# Patient Record
Sex: Male | Born: 1940
Health system: Southern US, Community
[De-identification: ages and names within clinical notes are randomized; demographics above are authoritative.]

## PROBLEM LIST (undated history)

## (undated) DIAGNOSIS — I428 Other cardiomyopathies: Secondary | ICD-10-CM

## (undated) DIAGNOSIS — N189 Chronic kidney disease, unspecified: Secondary | ICD-10-CM

## (undated) DIAGNOSIS — M009 Pyogenic arthritis, unspecified: Secondary | ICD-10-CM

## (undated) DIAGNOSIS — E785 Hyperlipidemia, unspecified: Secondary | ICD-10-CM

## (undated) DIAGNOSIS — I10 Essential (primary) hypertension: Secondary | ICD-10-CM

## (undated) DIAGNOSIS — I5020 Unspecified systolic (congestive) heart failure: Secondary | ICD-10-CM

## (undated) DIAGNOSIS — I2510 Atherosclerotic heart disease of native coronary artery without angina pectoris: Secondary | ICD-10-CM

## (undated) DIAGNOSIS — M25462 Effusion, left knee: Secondary | ICD-10-CM

## (undated) DIAGNOSIS — I2089 Other forms of angina pectoris: Secondary | ICD-10-CM

## (undated) DIAGNOSIS — I509 Heart failure, unspecified: Secondary | ICD-10-CM

## (undated) DIAGNOSIS — I208 Other forms of angina pectoris: Secondary | ICD-10-CM

## (undated) DIAGNOSIS — M130 Polyarthritis, unspecified: Secondary | ICD-10-CM

## (undated) DIAGNOSIS — M170 Bilateral primary osteoarthritis of knee: Secondary | ICD-10-CM

## (undated) DIAGNOSIS — D638 Anemia in other chronic diseases classified elsewhere: Secondary | ICD-10-CM

## (undated) DIAGNOSIS — M6282 Rhabdomyolysis: Secondary | ICD-10-CM

## (undated) DIAGNOSIS — E782 Mixed hyperlipidemia: Secondary | ICD-10-CM

## (undated) HISTORY — DX: Essential (primary) hypertension: I10

## (undated) HISTORY — DX: Atherosclerotic heart disease of native coronary artery without angina pectoris: I25.10

## (undated) HISTORY — DX: Hyperlipidemia, unspecified: E78.5

## (undated) HISTORY — DX: Other forms of angina pectoris: I20.8

## (undated) HISTORY — DX: Heart failure, unspecified: I50.9

## (undated) HISTORY — DX: Other forms of angina pectoris: I20.89

## (undated) HISTORY — DX: Pyogenic arthritis, unspecified: M00.9

## (undated) HISTORY — DX: Chronic kidney disease, unspecified: N18.9

---
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
## 1997-11-24 ENCOUNTER — Emergency Department (HOSPITAL_COMMUNITY): Admission: EM | Admit: 1997-11-24 | Discharge: 1997-11-24 | Payer: Self-pay | Admitting: Emergency Medicine

## 1997-12-03 ENCOUNTER — Encounter: Admission: RE | Admit: 1997-12-03 | Discharge: 1998-03-03 | Payer: Self-pay | Admitting: *Deleted

## 1998-03-11 ENCOUNTER — Encounter: Admission: RE | Admit: 1998-03-11 | Discharge: 1998-06-09 | Payer: Self-pay

## 1999-11-14 ENCOUNTER — Emergency Department (HOSPITAL_COMMUNITY): Admission: EM | Admit: 1999-11-14 | Discharge: 1999-11-14 | Payer: Self-pay | Admitting: Emergency Medicine

## 1999-11-14 ENCOUNTER — Encounter: Payer: Self-pay | Admitting: Emergency Medicine

## 1999-12-02 ENCOUNTER — Encounter: Admission: RE | Admit: 1999-12-02 | Discharge: 2000-01-27 | Payer: Self-pay | Admitting: Occupational Medicine

## 1999-12-30 ENCOUNTER — Encounter: Payer: Self-pay | Admitting: Occupational Medicine

## 1999-12-30 ENCOUNTER — Encounter: Admission: RE | Admit: 1999-12-30 | Discharge: 1999-12-30 | Payer: Self-pay | Admitting: Occupational Medicine

## 2000-03-08 ENCOUNTER — Encounter
Admission: RE | Admit: 2000-03-08 | Discharge: 2000-06-06 | Payer: Self-pay | Admitting: Physical Medicine & Rehabilitation

## 2000-06-22 ENCOUNTER — Encounter
Admission: RE | Admit: 2000-06-22 | Discharge: 2000-06-22 | Payer: Self-pay | Admitting: Physical Medicine & Rehabilitation

## 2000-08-11 ENCOUNTER — Ambulatory Visit (HOSPITAL_COMMUNITY)
Admission: RE | Admit: 2000-08-11 | Discharge: 2000-08-11 | Payer: Self-pay | Admitting: Physical Medicine & Rehabilitation

## 2000-08-11 ENCOUNTER — Encounter: Payer: Self-pay | Admitting: Physical Medicine & Rehabilitation

## 2000-09-15 ENCOUNTER — Encounter
Admission: RE | Admit: 2000-09-15 | Discharge: 2000-11-12 | Payer: Self-pay | Admitting: Physical Medicine & Rehabilitation

## 2007-10-24 ENCOUNTER — Emergency Department (HOSPITAL_COMMUNITY): Admission: EM | Admit: 2007-10-24 | Discharge: 2007-10-24 | Payer: Self-pay | Admitting: Emergency Medicine

## 2008-10-19 ENCOUNTER — Inpatient Hospital Stay (HOSPITAL_COMMUNITY): Admission: EM | Admit: 2008-10-19 | Discharge: 2008-10-22 | Payer: Self-pay | Admitting: Emergency Medicine

## 2008-10-19 ENCOUNTER — Ambulatory Visit: Payer: Self-pay | Admitting: Cardiology

## 2008-10-19 ENCOUNTER — Encounter (INDEPENDENT_AMBULATORY_CARE_PROVIDER_SITE_OTHER): Payer: Self-pay | Admitting: Internal Medicine

## 2008-10-30 DIAGNOSIS — E782 Mixed hyperlipidemia: Secondary | ICD-10-CM

## 2008-10-30 HISTORY — DX: Mixed hyperlipidemia: E78.2

## 2008-10-31 ENCOUNTER — Ambulatory Visit: Payer: Self-pay | Admitting: Cardiology

## 2008-10-31 ENCOUNTER — Emergency Department (HOSPITAL_COMMUNITY): Admission: EM | Admit: 2008-10-31 | Discharge: 2008-10-31 | Payer: Self-pay | Admitting: Emergency Medicine

## 2008-10-31 DIAGNOSIS — I5020 Unspecified systolic (congestive) heart failure: Secondary | ICD-10-CM | POA: Insufficient documentation

## 2008-10-31 DIAGNOSIS — I10 Essential (primary) hypertension: Secondary | ICD-10-CM

## 2008-10-31 HISTORY — DX: Essential (primary) hypertension: I10

## 2008-11-01 ENCOUNTER — Telehealth: Payer: Self-pay | Admitting: Cardiology

## 2008-11-02 ENCOUNTER — Ambulatory Visit: Payer: Self-pay | Admitting: Cardiology

## 2008-11-02 ENCOUNTER — Ambulatory Visit (HOSPITAL_COMMUNITY): Admission: RE | Admit: 2008-11-02 | Discharge: 2008-11-02 | Payer: Self-pay | Admitting: Cardiology

## 2008-11-05 ENCOUNTER — Telehealth: Payer: Self-pay | Admitting: Cardiology

## 2008-11-09 ENCOUNTER — Encounter: Payer: Self-pay | Admitting: Cardiology

## 2008-11-27 ENCOUNTER — Ambulatory Visit: Payer: Self-pay | Admitting: Cardiology

## 2008-11-27 DIAGNOSIS — I428 Other cardiomyopathies: Secondary | ICD-10-CM

## 2008-11-27 DIAGNOSIS — I2510 Atherosclerotic heart disease of native coronary artery without angina pectoris: Secondary | ICD-10-CM

## 2008-11-27 HISTORY — DX: Other cardiomyopathies: I42.8

## 2009-01-07 ENCOUNTER — Ambulatory Visit: Payer: Self-pay | Admitting: Cardiology

## 2009-01-07 DIAGNOSIS — T887XXA Unspecified adverse effect of drug or medicament, initial encounter: Secondary | ICD-10-CM | POA: Insufficient documentation

## 2009-02-20 ENCOUNTER — Ambulatory Visit: Payer: Self-pay | Admitting: Cardiology

## 2009-03-18 ENCOUNTER — Encounter: Payer: Self-pay | Admitting: Cardiology

## 2009-05-06 ENCOUNTER — Ambulatory Visit: Payer: Self-pay | Admitting: Cardiology

## 2009-05-07 ENCOUNTER — Telehealth: Payer: Self-pay | Admitting: Cardiology

## 2009-05-20 ENCOUNTER — Ambulatory Visit: Payer: Self-pay | Admitting: Cardiology

## 2009-05-20 DIAGNOSIS — I5022 Chronic systolic (congestive) heart failure: Secondary | ICD-10-CM

## 2009-05-22 LAB — CONVERTED CEMR LAB
Chloride: 109 meq/L (ref 96–112)
Creatinine, Ser: 1.3 mg/dL (ref 0.4–1.5)
Sodium: 141 meq/L (ref 135–145)

## 2009-06-03 ENCOUNTER — Telehealth: Payer: Self-pay | Admitting: Cardiology

## 2009-07-15 ENCOUNTER — Ambulatory Visit: Payer: Self-pay | Admitting: Cardiology

## 2009-08-30 ENCOUNTER — Ambulatory Visit: Payer: Self-pay | Admitting: Cardiology

## 2009-09-02 ENCOUNTER — Encounter: Payer: Self-pay | Admitting: Cardiology

## 2009-09-10 ENCOUNTER — Telehealth: Payer: Self-pay | Admitting: Cardiology

## 2009-12-16 ENCOUNTER — Encounter: Payer: Self-pay | Admitting: Cardiology

## 2010-03-04 ENCOUNTER — Ambulatory Visit (HOSPITAL_COMMUNITY)
Admission: RE | Admit: 2010-03-04 | Discharge: 2010-03-04 | Payer: Self-pay | Source: Home / Self Care | Attending: Cardiology | Admitting: Cardiology

## 2010-03-04 ENCOUNTER — Encounter: Payer: Self-pay | Admitting: Cardiology

## 2010-03-04 ENCOUNTER — Ambulatory Visit: Payer: Self-pay | Admitting: Cardiology

## 2010-03-04 ENCOUNTER — Ambulatory Visit: Payer: Self-pay

## 2010-03-04 DIAGNOSIS — N259 Disorder resulting from impaired renal tubular function, unspecified: Secondary | ICD-10-CM

## 2010-03-14 ENCOUNTER — Encounter: Payer: Self-pay | Admitting: Cardiology

## 2010-03-24 DIAGNOSIS — N183 Chronic kidney disease, stage 3 unspecified: Secondary | ICD-10-CM | POA: Insufficient documentation

## 2010-04-06 LAB — CONVERTED CEMR LAB
BUN: 39 mg/dL — ABNORMAL HIGH (ref 6–23)
Basophils Absolute: 0.1 10*3/uL (ref 0.0–0.1)
Basophils Relative: 0.9 % (ref 0.0–3.0)
CO2: 30 meq/L (ref 19–32)
Calcium: 9.2 mg/dL (ref 8.4–10.5)
Calcium: 9.5 mg/dL (ref 8.4–10.5)
Chloride: 103 meq/L (ref 96–112)
Creatinine, Ser: 1.7 mg/dL — ABNORMAL HIGH (ref 0.4–1.5)
Eosinophils Absolute: 0.8 10*3/uL — ABNORMAL HIGH (ref 0.0–0.7)
Eosinophils Relative: 12 % — ABNORMAL HIGH (ref 0.0–5.0)
GFR calc non Af Amer: 51.47 mL/min (ref >60)
GFR calc non Af Amer: 59.45 mL/min (ref >60)
Glucose, Bld: 76 mg/dL (ref 70–99)
Glucose, Bld: 82 mg/dL (ref 70–99)
HCT: 45.2 % (ref 39.0–52.0)
Hemoglobin: 15 g/dL (ref 13.0–17.0)
INR: 1.1 — ABNORMAL HIGH (ref 0.8–1.0)
Lymphocytes Relative: 39.3 % (ref 12.0–46.0)
Lymphs Abs: 2.5 10*3/uL (ref 0.7–4.0)
MCHC: 33.1 g/dL (ref 30.0–36.0)
MCV: 96.4 fL (ref 78.0–100.0)
Monocytes Absolute: 0.7 10*3/uL (ref 0.1–1.0)
Monocytes Relative: 11.2 % (ref 3.0–12.0)
Neutro Abs: 2.4 10*3/uL (ref 1.4–7.7)
Neutrophils Relative %: 36.6 % — ABNORMAL LOW (ref 43.0–77.0)
Platelets: 193 10*3/uL (ref 150.0–400.0)
Potassium: 4.1 meq/L (ref 3.5–5.1)
Potassium: 4.8 meq/L (ref 3.5–5.1)
Prothrombin Time: 11.5 s (ref 9.1–11.7)
RBC: 4.69 M/uL (ref 4.22–5.81)
RDW: 13 % (ref 11.5–14.6)
Sodium: 137 meq/L (ref 135–145)
Sodium: 139 meq/L (ref 135–145)
WBC: 6.5 10*3/uL (ref 4.5–10.5)
aPTT: 29.7 s — ABNORMAL HIGH (ref 21.7–28.8)

## 2010-04-08 NOTE — Progress Notes (Signed)
Summary: questions re dosage of med   Phone Note Call from Patient   Caller: Patient Reason for Call: Talk to Nurse Summary of Call: pt got a refill of furdsemide and directions were to take twice a day-he has only been taking one a day to previous directions-wants to verify coreect dosage-pls call [phone] Initial call taken by: Glynda Jaeger,  September 10, 2009 10:53 AM  Follow-up for Phone Call        [facility]. Sherri Rad, RN, BSN  September 10, 2009 11:10 AM  [facility]. Sherri Rad, RN, BSN  September 10, 2009 5:36 PM  [facility] Sander Nephew, RN September 12 2009 5:42 pm  Additional Follow-up for Phone Call Additional follow up Details #1::        Patient is to be taking it once daily. Additional Follow-up by: Rollene Rotunda, MD, [facility],  September 13, 2009 9:04 AM    Additional Follow-up for Phone Call Additional follow up Details #2::    returning call, [phone], Migdalia Dk  September 13, 2009 9:39 AM   Have been unable to reach pt by phone for some time.  Called and left message on voice mail of medication instructions and lab results with the request for him to call back ifhe has questions. Follow-up by: Charolotte Capuchin, RN,  September 24, 2009 1:03 PM

## 2010-04-08 NOTE — Progress Notes (Signed)
Summary: med reaction  RASH   Phone Note Call from Patient Call back at Home Phone [phone]   Caller: Patient Reason for Call: Talk to Nurse Summary of Call: pt unable to take lisinopril, last time he took it he got a rash back in sept '10 Initial call taken by: Migdalia Dk,  May 07, 2009 9:21 AM  Follow-up for Phone Call        Pt is to NOT TAKE Lisinopril.  I will speak to Dr Antoine Poche about orders to take its place.  Left message for pt.  Pam Fleming-Hayes,RN  Additional Follow-up for Phone Call Additional follow up Details #1::        Pt should start on Cozaar 25 mg daily. Additional Follow-up by: Rollene Rotunda, MD, [facility],  May 09, 2009 8:28 AM     Appended Document: med reaction  RASH **lmtc** pt aware of med change Deliah Goody, RN  May 14, 2009 2:37 PM    Clinical Lists Changes  Medications: Changed medication from LISINOPRIL 2.5 MG TABS (LISINOPRIL) one day to LOSARTAN POTASSIUM 25 MG TABS (LOSARTAN POTASSIUM) one tablet by mouth once daily - Signed Rx of LOSARTAN POTASSIUM 25 MG TABS (LOSARTAN POTASSIUM) one tablet by mouth once daily;  #30 x 12;  Signed;  Entered by: Deliah Goody, RN;  Authorized by: Rollene Rotunda, MD, [facility];  Method used: Electronically to [facility]  [phone]*, [address], [place], [place], [place]  [address], Ph: [phone] or [phone], Fax: [phone]    Prescriptions: LOSARTAN POTASSIUM 25 MG TABS (LOSARTAN POTASSIUM) one tablet by mouth once daily  #30 x 12   Entered by:   Deliah Goody, RN   Authorized by:   Rollene Rotunda, MD, [facility]   Signed by:   Deliah Goody, RN on 05/14/2009   Method used:   Electronically to        [facility]  [phone]* (retail)       [address]       [place], [place]  [address]       Ph: [phone] or [phone]       Fax: [phone]   RxID:   [id number]

## 2010-04-08 NOTE — Letter (Signed)
Summary:  [facility] Patient Note   [facility] Patient Note   Imported By: Roderic Ovens 01/06/2010 15:07:08  _____________________________________________________________________  External Attachment:    Type:   Image     Comment:   External Document

## 2010-04-08 NOTE — Progress Notes (Signed)
Summary: episode yesterday at church/not feeling well   Phone Note Call from Patient Call back at Home Phone [phone]   Caller: Patient Reason for Call: Talk to Nurse Summary of Call: Patient had an episode of not feeling well, while at church yesterday.  Is concerned about medication dosage.  Would like to speak to RN. Initial call taken by: Burnard Leigh,  June 03, 2009 1:14 PM  Follow-up for Phone Call        Called patient and left message on machine to call back Luis Nephew, RN  got over heated yesterday while at church.  States he had not eaten much and took medication.  They checked there and it was  130/86  128/86 .  He has not had any problems with these feelings since then and feels well now.  Pt is not diabetic and is not on any medications for such.  His last BS was 90.  Insctructed pt to make sure he is eating properly and to call back should these s/s return.  He states understanding and is in agreement. Follow-up by: Charolotte Capuchin, RN,  June 03, 2009 4:14 PM

## 2010-04-08 NOTE — Assessment & Plan Note (Signed)
Summary: 2 month rov/sl   Visit Type:  Follow-up Primary Provider:  Ace Gins, MD  CC:  CHF.  History of Present Illness: The patient presents for followup of his dilated cardiomyopathy. At the last appointment I increased his carvedilol to 12.5 mg b.i.d. He did well with this. He had no problems such as presyncope or syncope. He has had no new shortness of breath and denies PND or orthopnea. He has had no chest pressure, neck or arm discomfort. He denies any palpitations. He is going to start walking routinely. His weight has been stable. He watches his salt. He has had no swelling.  Current Medications (verified): 1)  Carvedilol 12.5 Mg Tabs (Carvedilol) .... One By Mouth Two Times A Day 2)  Spironolactone 25 Mg Tabs (Spironolactone) .... Daily 3)  Furosemide 40 Mg Tabs (Furosemide) .... Take One Tablet Once Daily 4)  Aspirin 81 Mg  Tabs (Aspirin) .... Daily 5)  Pravastatin Sodium 20 Mg Tabs (Pravastatin Sodium) .... Daily  Allergies (verified): 1)  ! Ace Inhibitors  Past History:  Past Medical History: Reviewed history from 11/27/2008 and no changes required. Transient history of hyperlipidemia, controlled on diet HTH (patient does not report difficult to control HTN). Cardiomyopathy (newly diagnosed)  Non obstructive CAD (Cath 8/10 The left main was normal.  The LAD had proximal long     25% stenosis, terminating as a focal 50% proximal to mid lesion.     First and second diagonals were small and normal.  The third     diagonal was moderate size and normal.  The circumflex in the     proximal AV groove had luminal irregularities.  There was a large     mid obtuse marginal which was branching.  There was scattered     luminal irregularities.  The inferior branch did supply some septal     perforators.  There was a PDA which is very small and normal.  The     right coronary artery was a small, codominant vessel.  It was very     narrow in caliber, being less than 1.5 mm.   There was proximal 95%     stenosis and a long mid 95% stenosis  Review of Systems       .As stated in the HPI and negative for all other systems.   Vital Signs:  Patient profile:   70 year old male Height:      70 inches Weight:      146 pounds BMI:     21.02 Pulse rate:   66 / minute Resp:     16 per minute BP sitting:   110 / 68  (right arm)  Vitals Entered By: Marrion Coy, CNA (May 06, 2009 9:25 AM)  Physical Exam  General:  Well developed, well nourished, in no acute distress. Head:  normocephalic and atraumatic Eyes:  PERRLA/EOM intact; conjunctiva and lids normal. Mouth:  Poor dentition, gums and palate normal. Oral mucosa normal. Neck:  Neck supple, no JVD. No masses, thyromegaly or abnormal cervical nodes. Chest Wall:  no deformities or breast masses noted Lungs:  Clear bilaterally to auscultation and percussion. Abdomen:  Bowel sounds positive; abdomen soft and non-tender without masses, organomegaly, or hernias noted. No hepatosplenomegaly. Msk:  Back normal, normal gait. Muscle strength and tone normal. Extremities:  No clubbing or cyanosis. Neurologic:  Alert and oriented x 3. Skin:  Intact without lesions or rashes. Cervical Nodes:  no significant adenopathy Axillary Nodes:  no significant  adenopathy Inguinal Nodes:  no significant adenopathy Psych:  Normal affect.   Detailed Cardiovascular Exam  Neck    Carotids: Carotids full and equal bilaterally without bruits.      Neck Veins: Normal, no JVD.    Heart    Inspection: no deformities or lifts noted.      Palpation: normal PMI with no thrills palpable.      Auscultation: regular rate and rhythm, S1, S2 without murmurs, rubs, gallops, or clicks.    Vascular    Abdominal Aorta: no palpable masses, pulsations, or audible bruits.      Femoral Pulses: normal femoral pulses bilaterally.      Pedal Pulses: diminished right dorsalis pedis pulse and diminished right posterior tibial pulse.  diminished  right dorsalis pedis pulse and diminished right posterior tibial pulse.      Radial Pulses: normal radial pulses bilaterally.      Peripheral Circulation: no clubbing, cyanosis, or edema noted with normal capillary refill.     EKG  Procedure date:  05/06/2009  Findings:       sinus rhythm, rate 66, axis within normal limits, intervals within normal limits, no acute ST-T wave changes.  Impression & Recommendations:  Problem # 1:  CARDIOMYOPATHY (ICD-425.4) Today I will add lisinopril 2.5 mg daily. I did review his most recent labs and his creatinine was only very slightly elevated at 1.27. I will watch renal function and potassium very closely with a basic metabolic profile in 2 weeks. He will continue the other meds and I will titrate again in 2 months or sooner if needed. We did discuss side effects including cough and angioedema as well as lightheadedness, presyncope and syncope. Orders: EKG w/ Interpretation (93000)  Problem # 2:  ESSENTIAL HYPERTENSION, BENIGN (ICD-401.1) His blood pressure is actually low and may be the rate limiting step and titrating his meds.  Problem # 3:  CAD (ICD-414.00) He has nonobstructive disease and will follow with risk reduction. Orders: EKG w/ Interpretation (93000)  Patient Instructions: 1)  Your physician recommends that you schedule a follow-up appointment in: 2 months with Dr Antoine Poche 2)  Your physician has requested that you limit the intake of sodium (salt) in your diet to two grams daily. Please see [facility] handout. 3)  You have been diagnosed with Congestive Heart Failure or CHF.  CHF is a condition in which a problem with the structure or function of the heart impairs its ability to supply sufficient blood flow to meet the body's needs.  For further information please visit www.cardiosmart.org for detailed information on CHF. 4)  Your physician recommends that you weigh, daily, at the same time every day, and in the same amount of clothing.   Please record your daily weights on the handout provided and bring it to your next appointment. 5)  Your physician has recommended you make the following change in your medication:  start Lisinopril 2.5 mg a day 6)  Your physician recommends that you return for lab work in:  2 weeks for Basic Metabolic panel 428.22  v58.69 Prescriptions: LISINOPRIL 2.5 MG TABS (LISINOPRIL) one day  #30 x 11   Entered by:   Charolotte Capuchin, RN   Authorized by:   Rollene Rotunda, MD, [facility]   Signed by:   Charolotte Capuchin, RN on 05/06/2009   Method used:   Electronically to        [facility]  [phone]* (retail)       [address]  [place], [place]  [address]       Ph: [phone] or [phone]       Fax: [phone]   RxID:   [id number]

## 2010-04-08 NOTE — Miscellaneous (Signed)
Summary: [facility]  Clinical Lists Changes  Medications: Removed medication of LOSARTAN POTASSIUM 25 MG TABS (LOSARTAN POTASSIUM) one tablet by mouth once daily

## 2010-04-08 NOTE — Assessment & Plan Note (Signed)
Summary: 2 MONTH ROV.SL   Visit Type:  Follow-up Primary Provider:  Ace Gins, MD  CC:  Cardiomyopathy.  History of Present Illness: The patient presents for followup of his cardiomyopathy. At the last appointment I tried to start a low dose of an ACE inhibitor. However, he developed a rash. He did have one episode of low blood pressure but it was related to being in a hot church and not eating. He has since been switched to Cozaar 25 mg daily and has done well. He has had no new shortness of breath and has been quite active doing yard work. He denies any PND or orthopnea. He has had no lightheadedness, presyncope or syncope. He watches carefully to make sure he doesn't dehydrate when he is working.  Current Medications (verified): 1)  Carvedilol 12.5 Mg Tabs (Carvedilol) .... One By Mouth Two Times A Day 2)  Spironolactone 25 Mg Tabs (Spironolactone) .... Daily 3)  Furosemide 40 Mg Tabs (Furosemide) .... Take One Tablet Once Daily 4)  Aspirin 81 Mg  Tabs (Aspirin) .... Daily 5)  Pravastatin Sodium 20 Mg Tabs (Pravastatin Sodium) .... Daily 6)  Losartan Potassium 25 Mg Tabs (Losartan Potassium) .... One Tablet By Mouth Once Daily  Allergies (verified): 1)  ! Ace Inhibitors  Past History:  Past Medical History: Transient history of hyperlipidemia, controlled on diet HTH (patient does not report difficult to control HTN). Cardiomyopathy (newly diagnosed) (EF 20%) Non obstructive CAD (Cath 8/10 The left main was normal.  The LAD had proximal long     25% stenosis, terminating as a focal 50% proximal to mid lesion.     First and second diagonals were small and normal.  The third     diagonal was moderate size and normal.  The circumflex in the     proximal AV groove had luminal irregularities.  There was a large     mid obtuse marginal which was branching.  There was scattered     luminal irregularities.  The inferior branch did supply some septal     perforators.  There was a PDA  which is very small and normal.  The     right coronary artery was a small, codominant vessel.  It was very     narrow in caliber, being less than 1.5 mm.  There was proximal 95%     stenosis and a long mid 95% stenosis  Review of Systems       As stated in the HPI and negative for all other systems.   Vital Signs:  Patient profile:   70 year old male Height:      70 inches Weight:      145 pounds BMI:     20.88 Pulse rate:   79 / minute Resp:     16 per minute BP sitting:   108 / 59  (right arm)  Vitals Entered By: Marrion Coy, CNA (Jul 15, 2009 8:54 AM)  Physical Exam  General:  Well developed, well nourished, in no acute distress. Head:  normocephalic and atraumatic Neck:  Neck supple, no JVD. No masses, thyromegaly or abnormal cervical nodes. Chest Wall:  no deformities or breast masses noted Abdomen:  Bowel sounds positive; abdomen soft and non-tender without masses, organomegaly, or hernias noted. No hepatosplenomegaly. Msk:  Back normal, normal gait. Muscle strength and tone normal. Extremities:  No clubbing or cyanosis. Cervical Nodes:  no significant adenopathy Psych:  Normal affect.   Detailed Cardiovascular Exam  Neck  Carotids: Carotids full and equal bilaterally without bruits.      Neck Veins: Normal, no JVD.    Heart    Inspection: no deformities or lifts noted.      Palpation: normal PMI with no thrills palpable.      Auscultation: regular rate and rhythm, S1, S2 without murmurs, rubs, gallops, or clicks.    Vascular    Abdominal Aorta: no palpable masses, pulsations, or audible bruits.      Femoral Pulses: normal femoral pulses bilaterally.      Pedal Pulses: diminished right dorsalis pedis pulse and diminished right posterior tibial pulse.  diminished right dorsalis pedis pulse and diminished right posterior tibial pulse.      Radial Pulses: normal radial pulses bilaterally.      Peripheral Circulation: no clubbing, cyanosis, or edema noted with  normal capillary refill.     Impression & Recommendations:  Problem # 1:  CHRONIC SYSTOLIC HEART FAILURE (ICD-428.22) Today I will try to increase his Cozaar to 25 mg b.i.d. and leave the other medicines as listed. He had labs checked in March. I will check another basic metabolic profile when I see him in one month.  Problem # 2:  CAD (ICD-414.00) He is having no new symptoms. He will continue with risk reduction.  Patient Instructions: 1)  Your physician recommends that you schedule a follow-up appointment in: 1 month 2)  Your physician has recommended you make the following change in your medication: increase Cozaar to 25 mg twice a day 3)  You have been diagnosed with Congestive Heart Failure or CHF.  CHF is a condition in which a problem with the structure or function of the heart impairs its ability to supply sufficient blood flow to meet the body's needs.  For further information please visit www.cardiosmart.org for detailed information on CHF. 4)  Your physician recommends that you weigh, daily, at the same time every day, and in the same amount of clothing.  Please record your daily weights on the handout provided and bring it to your next appointment. Prescriptions: LOSARTAN POTASSIUM 25 MG TABS (LOSARTAN POTASSIUM) one twice a day  #60 x 11   Entered by:   Charolotte Capuchin, RN   Authorized by:   Rollene Rotunda, MD, [facility]   Signed by:   Charolotte Capuchin, RN on 07/15/2009   Method used:   Electronically to        [facility]  [phone]* (retail)       [address]       [place], [place]  [address]       Ph: [phone] or [phone]       Fax: [phone]   RxID:   [id number]

## 2010-04-08 NOTE — Assessment & Plan Note (Signed)
Summary: 1 month rov/sl   Visit Type:  Follow-up Primary Provider:  Ace Gins, MD  CC:  Cardiomyopathy.  History of Present Illness: The patient presents for followup. Since I last saw him he has had no cardiovascular complaints. He denies any chest discomfort, neck or arm discomfort. He denies any shortness of breath, PND or orthopnea. He has had no weight gain or swelling. He has had no edema or abdominal distention. He remains very active. At the last visit I increased his Cozaar and he has done well with this.  Current Medications (verified): 1)  Carvedilol 12.5 Mg Tabs (Carvedilol) .... One By Mouth Two Times A Day 2)  Spironolactone 25 Mg Tabs (Spironolactone) .... Daily 3)  Furosemide 40 Mg Tabs (Furosemide) .... Take One Tablet Once Daily 4)  Aspirin 81 Mg  Tabs (Aspirin) .... Daily 5)  Pravastatin Sodium 20 Mg Tabs (Pravastatin Sodium) .... Daily 6)  Losartan Potassium 25 Mg Tabs (Losartan Potassium) .... One Twice A Day  Allergies (verified): 1)  ! Ace Inhibitors  Past History:  Past Medical History: Transient history of hyperlipidemia, controlled on diet HTH (patient does not report difficult to control HTN). Cardiomyopathy (newly diagnosed) (EF 20%) Non obstructive CAD (Cath 8/10 The left main was normal.  The LAD had proximal long     25% stenosis, terminating as a focal 50% proximal to mid lesion.     First and second diagonals were small and normal.  The third     diagonal was moderate size and normal.  The circumflex in the     proximal AV groove had luminal irregularities.  There was a large     mid obtuse marginal which was branching.  There was scattered     luminal irregularities.  The inferior branch did supply some septal     perforators.  There was a PDA which is very small and normal.  The     right coronary artery was a small, codominant vessel.  It was very     narrow in caliber, being less than 1.5 mm.  There was proximal 95%     stenosis and a  long mid 95% stenosis)  Review of Systems       As stated in the HPI and negative for all other systems.   Vital Signs:  Patient profile:   70 year old male Height:      70 inches Weight:      148 pounds BMI:     21.31 Pulse rate:   68 / minute BP sitting:   108 / 52  (left arm) Cuff size:   large  Vitals Entered By: Marrion Coy, CNA (August 30, 2009 4:25 PM)  Physical Exam  General:  Well developed, well nourished, in no acute distress. Head:  normocephalic and atraumatic Eyes:  PERRLA/EOM intact; conjunctiva and lids normal. Neck:  Neck supple, no JVD. No masses, thyromegaly or abnormal cervical nodes. Chest Wall:  no deformities or breast masses noted Lungs:  Clear bilaterally to auscultation and percussion. Abdomen:  Bowel sounds positive; abdomen soft and non-tender without masses, organomegaly, or hernias noted. No hepatosplenomegaly. Msk:  Back normal, normal gait. Muscle strength and tone normal. Extremities:  No clubbing or cyanosis. Neurologic:  Alert and oriented x 3. Skin:  Intact without lesions or rashes. Cervical Nodes:  no significant adenopathy Inguinal Nodes:  no significant adenopathy Psych:  Normal affect.   Detailed Cardiovascular Exam  Neck    Carotids: Carotids full and equal bilaterally  without bruits.      Neck Veins: Normal, no JVD.    Heart    Inspection: no deformities or lifts noted.      Palpation: normal PMI with no thrills palpable.      Auscultation: regular rate and rhythm, S1, S2 without murmurs, rubs, gallops, or clicks.    Vascular    Abdominal Aorta: no palpable masses, pulsations, or audible bruits.      Femoral Pulses: normal femoral pulses bilaterally.      Pedal Pulses: diminished right dorsalis pedis pulse and diminished right posterior tibial pulse.  diminished right dorsalis pedis pulse and diminished right posterior tibial pulse.      Radial Pulses: normal radial pulses bilaterally.      Peripheral Circulation: no  clubbing, cyanosis, or edema noted with normal capillary refill.     Impression & Recommendations:  Problem # 1:  CHRONIC SYSTOLIC HEART FAILURE (ICD-428.22) He has a nonischemic cardiomyopathy with some coronary disease that is out of proportion to the degree of dysfunction. He has no symptoms (class I). I will not titrate his medications further as I don't think his blood pressure will allow. When he comes back in 6 months I will repeat an echocardiogram.  Problem # 2:  CAD (ICD-414.00) This will be managed medically. Of note he is due to have lipids and a basic metabolic profile with his primary doctor in a few weeks. He will have these faxed to me for my records. Orders: EKG w/ Interpretation (93000)  Problem # 3:  ESSENTIAL HYPERTENSION, BENIGN (ICD-401.1) His blood pressure is controlled in the context of managing his cardiomyopathy.  Patient Instructions: 1)  Your physician recommends that you schedule a follow-up appointment in: 6 months with Dr Antoine Poche 2)  Your physician recommends that you continue on your current medications as directed. Please refer to the Current Medication list given to you today. 3)  Your physician has requested that you have an echocardiogram.  Echocardiography is a painless test that uses sound waves to create images of your heart. It provides your doctor with information about the size and shape of your heart and how well your heart's chambers and valves are working.  This procedure takes approximately one hour. There are no restrictions for this procedure.

## 2010-04-10 NOTE — Assessment & Plan Note (Signed)
Summary: 428.22 414.01  pfh,rn   Visit Type:  Follow-up Primary Provider:  Ace Gins, MD  CC:  cardiomyopathy.  History of Present Illness: The patient presents for followup of his nonischemic cardiomyopathy. Since I last saw him he has had no new cardiovascular complaints. He denies any chest pressure, neck or arm discomfort. He denies any palpitations, presyncope or syncope. He has had no weight gain or edema. In particular he has no new shortness of breath, PND or orthopnea. 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  He had an echo today and I have personally reviewed these images. I believe his ejection fraction has improved to about 30-35%. He has had improvement in his end-systolic and diastolic dimensions as well.  Current Medications (verified): 1)  Carvedilol 12.5 Mg Tabs (Carvedilol) .... One By Mouth Two Times A Day 2)  Spironolactone 25 Mg Tabs (Spironolactone) .... Daily 3)  Furosemide 40 Mg Tabs (Furosemide) .... Take One Tablet Once Daily 4)  Aspirin 81 Mg  Tabs (Aspirin) .... Daily 5)  Pravastatin Sodium 20 Mg Tabs (Pravastatin Sodium) .... Daily 6)  Losartan Potassium 25 Mg Tabs (Losartan Potassium) .... One Twice A Day 7)  Ferrous Fumarate 325 (106 Fe) Mg Tabs (Ferrous Fumarate) .Marland Kitchen.. 1 By Mouth Two Times A Day 8)  Carvedilol 3.125 Mg Tabs (Carvedilol) .... One Twice A Day To Be Taken With 12.5 Mg Twice A Day  Allergies (verified): 1)  ! Ace Inhibitors  Past History:  Past Medical History: Last updated: 08/30/2009 Transient history of hyperlipidemia, controlled on diet HTH (patient does not report difficult to control HTN). Cardiomyopathy (newly diagnosed) (EF 20%) Non obstructive CAD (Cath 8/10 The left main was normal.  The LAD had proximal long     25% stenosis, terminating as a focal 50% proximal to mid lesion.     First and second diagonals were small and normal.  The third     diagonal was moderate size and normal.  The  circumflex in the     proximal AV groove had luminal irregularities.  There was a large     mid obtuse marginal which was branching.  There was scattered     luminal irregularities.  The inferior branch did supply some septal     perforators.  There was a PDA which is very small and normal.  The     right coronary artery was a small, codominant vessel.  It was very     narrow in caliber, being less than 1.5 mm.  There was proximal 95%     stenosis and a long mid 95% stenosis)  Vital Signs:  Patient profile:   70 year old male Height:      70 inches Weight:      152 pounds BMI:     21.89 Pulse rate:   67 / minute Resp:     16 per minute BP sitting:   110 / 62  (right arm)  Vitals Entered By: Marrion Coy, CNA (March 04, 2010 10:36 AM)  Physical Exam  General:  Well developed, well nourished, in no acute distress. Head:  normocephalic and atraumatic Eyes:  PERRLA/EOM intact; conjunctiva and lids normal. Mouth:  Poor dentition, gums and palate normal. Oral mucosa normal. Neck:  Neck supple, no JVD. No masses, thyromegaly or abnormal cervical nodes. Chest Wall:  no deformities or breast masses noted Lungs:  Clear bilaterally to auscultation and percussion. Abdomen:  Bowel sounds positive; abdomen soft and non-tender  without masses, organomegaly, or hernias noted. No hepatosplenomegaly. Msk:  Back normal, normal gait. Muscle strength and tone normal. Extremities:  No clubbing or cyanosis. Neurologic:  Alert and oriented x 3. Skin:  Intact without lesions or rashes. Cervical Nodes:  no significant adenopathy Inguinal Nodes:  no significant adenopathy Psych:  Normal affect.   Detailed Cardiovascular Exam  Neck    Carotids: Carotids full and equal bilaterally without bruits.      Neck Veins: Normal, no JVD.    Heart    Inspection: no deformities or lifts noted.      Palpation: normal PMI with no thrills palpable.      Auscultation: regular rate and rhythm, S1, S2 without  murmurs, rubs, gallops, or clicks.    Vascular    Abdominal Aorta: no palpable masses, pulsations, or audible bruits.      Femoral Pulses: normal femoral pulses bilaterally.      Pedal Pulses: diminished right dorsalis pedis pulse and diminished right posterior tibial pulse.  diminished right dorsalis pedis pulse and diminished right posterior tibial pulse.      Radial Pulses: normal radial pulses bilaterally.      Peripheral Circulation: no clubbing, cyanosis, or edema noted with normal capillary refill.     EKG  Procedure date:  03/04/2010  Findings:      Sinus rhythm, rate 66, axis within normal limits, intervals within normal limits, no acute ST-T wave changes.  Impression & Recommendations:  Problem # 1:  CHRONIC SYSTOLIC HEART FAILURE (ICD-428.22) His ejection fraction is slightly improved. I am going to try to titrate his medications further by adding another 3.125 mg twice daily carvedilol to his existing regimen.  Problem # 2:  ESSENTIAL HYPERTENSION, BENIGN (ICD-401.1) His blood pressures being treated in the context of managing his cardiomyopathy.  Problem # 3:  RENAL INSUFFICIENCY (ICD-588.9) He does have some mild renal insufficiency. He is seeing a nephrologist but tolerating his current dose of ARB.  Other Orders: EKG w/ Interpretation (93000)  Patient Instructions: 1)  Your physician recommends that you schedule a follow-up appointment in: 6 months with Dr Antoine Poche 2)  Your physician has recommended you make the following change in your medication: Increase carvedilol by 3.125 mg twice a day Prescriptions: CARVEDILOL 3.125 MG TABS (CARVEDILOL) one twice a day to be taken with 12.5 mg twice a day  #60 x 6   Entered by:   Charolotte Capuchin, RN   Authorized by:   Rollene Rotunda, MD, [facility]   Signed by:   Charolotte Capuchin, RN on 03/04/2010   Method used:   Electronically to        [facility]  [phone]* (retail)       [address]        [place], [place]  [address]       Ph: [phone] or [phone]       Fax: [phone]   RxID:   [id number]

## 2010-04-30 NOTE — Letter (Signed)
Summary: [facility] Patient Note   [facility] Patient Note   Imported By: Roderic Ovens 04/25/2010 13:58:03  _____________________________________________________________________  External Attachment:    Type:   Image     Comment:   External Document

## 2010-06-14 LAB — POCT I-STAT 3, VENOUS BLOOD GAS (G3P V)
Bicarbonate: 23.9 mEq/L (ref 20.0–24.0)
O2 Saturation: 77 %
TCO2: 25 mmol/L (ref 0–100)
TCO2: 26 mmol/L (ref 0–100)
pCO2, Ven: 41.6 mmHg — ABNORMAL LOW (ref 45.0–50.0)
pCO2, Ven: 42.8 mmHg — ABNORMAL LOW (ref 45.0–50.0)
pH, Ven: 7.368 — ABNORMAL HIGH (ref 7.250–7.300)

## 2010-06-14 LAB — POCT I-STAT 3, ART BLOOD GAS (G3+)
Bicarbonate: 22.9 mEq/L (ref 20.0–24.0)
O2 Saturation: 98 %
TCO2: 24 mmol/L (ref 0–100)
pCO2 arterial: 37.1 mmHg (ref 35.0–45.0)
pO2, Arterial: 106 mmHg — ABNORMAL HIGH (ref 80.0–100.0)

## 2010-06-14 LAB — BRAIN NATRIURETIC PEPTIDE: Pro B Natriuretic peptide (BNP): 316 pg/mL — ABNORMAL HIGH (ref 0.0–100.0)

## 2010-06-14 LAB — BASIC METABOLIC PANEL
Chloride: 105 mEq/L (ref 96–112)
GFR calc Af Amer: 58 mL/min — ABNORMAL LOW (ref >60)
GFR calc Af Amer: >60 mL/min (ref >60)
GFR calc non Af Amer: 56 mL/min — ABNORMAL LOW (ref >60)
Glucose, Bld: 94 mg/dL (ref 70–99)
Potassium: 4.3 mEq/L (ref 3.5–5.1)
Potassium: 4.5 mEq/L (ref 3.5–5.1)
Sodium: 134 mEq/L — ABNORMAL LOW (ref 135–145)
Sodium: 136 mEq/L (ref 135–145)

## 2010-06-15 LAB — COMPREHENSIVE METABOLIC PANEL
AST: 30 U/L (ref 0–37)
Albumin: 2.9 g/dL — ABNORMAL LOW (ref 3.5–5.2)
Alkaline Phosphatase: 146 U/L — ABNORMAL HIGH (ref 39–117)
BUN: 17 mg/dL (ref 6–23)
Calcium: 8.8 mg/dL (ref 8.4–10.5)
Chloride: 104 mEq/L (ref 96–112)
Creatinine, Ser: 1.26 mg/dL (ref 0.4–1.5)
Creatinine, Ser: 1.26 mg/dL (ref 0.4–1.5)
GFR calc Af Amer: >60 mL/min (ref >60)
Glucose, Bld: 97 mg/dL (ref 70–99)
Potassium: 3.8 mEq/L (ref 3.5–5.1)
Total Bilirubin: 1.1 mg/dL (ref 0.3–1.2)
Total Protein: 6.7 g/dL (ref 6.0–8.3)

## 2010-06-15 LAB — URINALYSIS, MICROSCOPIC ONLY
Glucose, UA: NEGATIVE mg/dL
Ketones, ur: NEGATIVE mg/dL
Leukocytes, UA: NEGATIVE
Nitrite: NEGATIVE
Protein, ur: NEGATIVE mg/dL
Urobilinogen, UA: 0.2 mg/dL (ref 0.0–1.0)

## 2010-06-15 LAB — CBC
HCT: 40.4 % (ref 39.0–52.0)
HCT: 40.8 % (ref 39.0–52.0)
Hemoglobin: 14 g/dL (ref 13.0–17.0)
MCV: 95.4 fL (ref 78.0–100.0)
Platelets: 192 10*3/uL (ref 150–400)
RDW: 14.7 % (ref 11.5–15.5)
RDW: 14.7 % (ref 11.5–15.5)
WBC: 6.8 10*3/uL (ref 4.0–10.5)

## 2010-06-15 LAB — CARDIAC PANEL(CRET KIN+CKTOT+MB+TROPI)
Relative Index: 1.1 (ref 0.0–2.5)
Relative Index: 1.5 (ref 0.0–2.5)
Total CK: 175 U/L (ref 7–232)
Total CK: 259 U/L — ABNORMAL HIGH (ref 7–232)
Troponin I: 0.04 ng/mL (ref 0.00–0.06)

## 2010-06-15 LAB — DIFFERENTIAL
Basophils Absolute: 0 10*3/uL (ref 0.0–0.1)
Eosinophils Relative: 7 % — ABNORMAL HIGH (ref 0–5)
Lymphocytes Relative: 40 % (ref 12–46)
Lymphs Abs: 2.7 10*3/uL (ref 0.7–4.0)
Neutro Abs: 3 10*3/uL (ref 1.7–7.7)

## 2010-06-15 LAB — POCT I-STAT, CHEM 8
Glucose, Bld: 111 mg/dL — ABNORMAL HIGH (ref 70–99)
HCT: 43 % (ref 39.0–52.0)
Hemoglobin: 14.6 g/dL (ref 13.0–17.0)
Potassium: 4.1 mEq/L (ref 3.5–5.1)
Sodium: 141 mEq/L (ref 135–145)

## 2010-06-15 LAB — CK TOTAL AND CKMB (NOT AT ARMC)
CK, MB: 4.3 ng/mL — ABNORMAL HIGH (ref 0.3–4.0)
Relative Index: 2.4 (ref 0.0–2.5)
Total CK: 177 U/L (ref 7–232)

## 2010-06-15 LAB — T3, FREE: T3, Free: 3.6 pg/mL (ref 2.3–4.2)

## 2010-06-15 LAB — HEMOGLOBIN A1C: Hgb A1c MFr Bld: 6.1 % (ref 4.6–6.1)

## 2010-06-15 LAB — LIPID PANEL
HDL: 58 mg/dL (ref >39)
Total CHOL/HDL Ratio: 3.1 RATIO
Triglycerides: 58 mg/dL (ref <150)
VLDL: 12 mg/dL (ref 0–40)

## 2010-06-15 LAB — T4, FREE: Free T4: 1.28 ng/dL (ref 0.80–1.80)

## 2010-06-15 LAB — POCT CARDIAC MARKERS: CKMB, poc: 2.3 ng/mL (ref 1.0–8.0)

## 2010-06-15 LAB — BRAIN NATRIURETIC PEPTIDE
Pro B Natriuretic peptide (BNP): 1069 pg/mL — ABNORMAL HIGH (ref 0.0–100.0)
Pro B Natriuretic peptide (BNP): 1713 pg/mL — ABNORMAL HIGH (ref 0.0–100.0)

## 2010-06-30 DIAGNOSIS — D472 Monoclonal gammopathy: Secondary | ICD-10-CM | POA: Insufficient documentation

## 2010-07-22 NOTE — Cardiovascular Report (Signed)
NAMEJAHKARI, Luis Chen               ACCOUNT NO.:  [id number]   MEDICAL RECORD NO.:  [id number]          PATIENT TYPE:  OIB   LOCATION:  2899                         FACILITY:  [facility]   PHYSICIAN:  Rollene Rotunda, MD, FACCDATE OF BIRTH:  [birth year]   DATE OF PROCEDURE:  11/02/2008  DATE OF DISCHARGE:  11/02/2008                            CARDIAC CATHETERIZATION   PRIMARY CARE PHYSICIAN:  None.   PROCEDURE:  Right and left heart catheterization/coronary arteriography.   INDICATIONS:  Evaluate the patient with cardiomyopathy.   PROCEDURE NOTE:  Left heart catheterization performed via the right  femoral artery, right heart catheterization performed via the right  femoral vein.  Both vessels were cannulated using an anterior wall  puncture.  A #5-French arterial sheath and a #7-French venous sheath  were inserted via the Seldinger technique.  Preformed Judkins, pigtail,  and a Swan-Ganz catheter were utilized.  The patient tolerated the  procedure well and left the lab in stable condition.   RESULTS:  1. Hemodynamics:  RA mean 3, RV 25/2, PA 23/9 with a mean of 15,      pulmonary capillary wedge pressure mean 5, LV 118/8, AO 112/60.      Cardiac output/cardiac index (Fick 5.62/3.1).  2. Coronaries:  The left main was normal.  The LAD had proximal long      25% stenosis, terminating as a focal 50% proximal to mid lesion.      First and second diagonals were small and normal.  The third      diagonal was moderate size and normal.  The circumflex in the      proximal AV groove had luminal irregularities.  There was a large      mid obtuse marginal which was branching.  There was scattered      luminal irregularities.  The inferior branch did supply some septal      perforators.  There was a PDA which is very small and normal.  The      right coronary artery was a small, codominant vessel.  It was very      narrow in caliber, being less than 1.5 mm.  There was proximal 95%      stenosis  and a long mid 95% stenosis.  3. Left ventriculogram.  The left ventricle was not injected.  I did      cross for pressures.  The patient does have some mild renal      insufficiency.  He is known to have an ejection fraction of 20%      from echo.   CONCLUSION:  Nonobstructive large vessel coronary disease, he does have  small vessel coronary disease in a small but codominant right coronary.  Overall, this does not explain the degree of LV dysfunction.  Therefore,  he will be diagnosed with a nonischemic cardiomyopathy.  His right heart  pressures are normal and low.   PLAN:  The patient will continue to have medical management.  Of note,  he was in the emergency room last night with a possible allergic  reaction to lisinopril, so we will avoid ACE  inhibitors.  I will need to  consider starting an ARB at future appointments.      Rollene Rotunda, MD, [facility]  Electronically Signed    JH/MEDQ  D:  11/02/2008  T:  11/03/2008  Job:  [id number]

## 2010-07-22 NOTE — Discharge Summary (Signed)
Luis Chen, Luis Chen               ACCOUNT NO.:  [id number]   MEDICAL RECORD NO.:  [id number]          PATIENT TYPE:  INP   LOCATION:  4731                         FACILITY:  [facility]   PHYSICIAN:  Luis Chen, M.D.    DATE OF BIRTH:  1940/05/20   DATE OF ADMISSION:  10/19/2008  DATE OF DISCHARGE:  10/22/2008                               DISCHARGE SUMMARY   DISCHARGE DIAGNOSES:  1. Acute systolic and diastolic congestive heart failure exacerbation      (new diagnosis).  Per the 2D echocardiogram on October 19, 2008, the      patient's ejection fraction was 20-25%.  2. Dyslipidemia.  3. Hypertension.  4. Transient hepatic transaminitis, possibly secondary to hepatic      congestion, completely resolved.   DISCHARGE MEDICATIONS:  1. Coreg 3.125 mg b.i.d.  2. Furosemide 40 mg b.i.d.  3. Lisinopril 5 mg daily.  4. Potassium chloride 20 mEq half a tablet daily.  5. Spironolactone 25 mg daily.  6. Pravastatin 20 mg daily.  7. Aspirin 81 mg daily.   DISCHARGE DISPOSITION:  The patient is currently stable and in improved  condition.  The plan is to discharge him home today.  He has a follow up  appointment scheduled with his new primary care physician, Dr. Concepcion Elk  on November 05, 2008, at 3 o'clock p.m.  One of the physician assistants  from [facility] will be coming to make the patient an  appointment to follow up with one of the [facility] Cardiologists prior to  hospital discharge.   CONSULTATIONS:  None.   PROCEDURE PERFORMED:  1. Chest x-ray on October 20, 2008.  The results revealed interval      clearing of interstitial edema and bibasilar atelectasis.  Stable      cardiomegaly.  Changes compatible with chronic obstructive      pulmonary disease.  2. A 2-D echocardiogram on October 19, 2008.  The results revealed left      ventricular size mildly to moderately dilated.  Wall thickness was      normal.  Ejection fraction estimated to be 20-25%.  Severe global  hypokinesis.  Grade 2 diastolic dysfunction.  Mild aortic valve      regurgitation.  Right ventricular systolic function was mildly      reduced.  Please see the dictated echocardiogram report in the e-      chart system for further results.   HISTORY OF PRESENT ILLNESS:  The patient is a 70 year old man with a  past medical history significant for remote hyperlipidemia and  hypertension, who presented to the emergency department on October 19, 2008, with a chief complaint of shortness of breath.  When he was  evaluated in the emergency department, his blood pressure was 156/105.  His EKG revealed normal sinus rhythm with a heart rate of 91 beats per  minute and lateral T-wave abnormalities.  His chest x-ray revealed  cardiomegaly with probable mild CHF.  His initial cardiac markers were  negative.  His BNP was elevated at 1713.  He was admitted for further  evaluation and management.  For additional details, please see the dictated history and physical.   HOSPITAL COURSE:  1. The patient was started on treatment with Lasix 20 mg IV every 12      hours and lisinopril.  The following day, Coreg and spironolactone      as well as aspirin were added.  For further evaluation, cardiac      enzymes, fasting lipid profile, TSH, free T4, hemoglobin A1c, and a      follow up BNP were ordered.  The patient's cardiac enzymes were      within normal limits.  His fasting lipid profile revealed a total      cholesterol of 180, triglycerides of 58, HDL cholesterol of 58, and      LDL cholesterol of 110.  He was started on Zocor 20 mg q.h.s.  His      TSH was within normal limits at 3.7 and his free T4 was within      normal limits at 1.28.  His hemoglobin A1c was within normal limits      at 6.1.  His follow up BNP improved to 1069.  A 2-D echocardiogram      was ordered for further evaluation and in essence, it revealed      severe global hypokinesis of the left ventricle and an ejection       fraction of 20-25%.   A follow up chest x-ray revealed interval clearing of the interstitial  edema.  His BNP as ordered prior to hospital discharge, improved  significantly to 316.   The patient is currently symptomatically improved.  There is no evidence  of peripheral edema.  He is oxygenating 100% on room air.   Of note, his SGOT was slightly elevated at 38.  However, prior to  hospital discharge, it normalized to 30.  It is possible that the  patient may have had mild hepatic congestion from acute systolic  congestive heart failure.   A cardiology consultation was not obtained during the hospitalization;  however, I did contact one of the physician assistants from [facility]  Cardiology who will be coming by before the patient leaves to make him a  follow up appointment with one of their cardiologist.      Luis Chen, M.D.  Electronically Signed     DF/MEDQ  D:  10/22/2008  T:  10/23/2008  Job:  [id number]   cc:   Fleet Contras, M.D.  [facility]

## 2010-07-22 NOTE — H&P (Signed)
Luis Chen, PERETZ NO.:  [id number]   MEDICAL RECORD NO.:  [id number]          PATIENT TYPE:  INP   LOCATION:  1823                         FACILITY:  [facility]   PHYSICIAN:  Vania Rea, M.D. DATE OF BIRTH:  1940-06-06   DATE OF ADMISSION:  10/19/2008  DATE OF DISCHARGE:                              HISTORY & PHYSICAL   PRIMARY CARE PHYSICIAN:  Unassigned.   CHIEF COMPLAINT:  Shortness of breath.   HISTORY OF PRESENT ILLNESS:  This is a 70 year old African American  gentleman who considered himself to be in good health until yesterday  evening.  While watching a football game on television, he developed a  gradual onset of shortness of breath.  The patient was at rest when the  shortness of breath developed, and he denies any dyspnea on exertion or  any history of dyspnea while climbing stairs.  The shortness of breath  became so severe that the patient came to the emergency room to be  evaluated and the hospitalist service was subsequently called to assist  with management.  The patient says the only previous episode of  shortness of breath he had was about 4 days ago when he noticed a  transient episode of shortness of breath again while at rest, but he  ignored it.  Prior to coming to the hospital, the patient checked his  blood pressure and noticed that it was 170/100.   The patient says he has never formally been told that he has high blood  pressure.  A few years ago at the [facility] he did get his blood pressure  checked and they rested him and then checked it again.  He is fairly  active in his church's community garden but does not do strenuous work.   He is had no fever, cough or cold.  No chest pains, no palpitations.  No  PND.  No lower extremity edema.  He does not smoke or use alcohol.   PAST MEDICAL HISTORY:  Transient history of hyperlipidemia, controlled  on diet.   MEDICATIONS:  None.   ALLERGIES:  None.   SOCIAL HISTORY:  Denies  tobacco, alcohol or illicit drug use.  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   FAMILY HISTORY:  Significant for a mother now deceased who had diabetes.  A brother who has had CABG x2 and first started having cardiac disease  in his 81s.  That brother is 10 years older than our patient.  Other  than this, there is no significant family.   REVIEW OF SYSTEMS:  On a 10-point review of systems, unremarkable.   PHYSICAL EXAMINATION:  A very pleasant, slender 70 year old African  American gentleman lying in a stretcher, not acutely distressed at this  time.  VITAL SIGNS:  His temperature is 97.8, his pulse is 86, respiration 22,  blood pressure initially 156/105, currently 142/94.  He is saturating at  97% on 2 liters.  His pupils are round and equal.  Mucous membranes pink  and anicteric.  He is not dehydrated.  No cervical lymphadenopathy or thyromegaly.  No carotid bruit.  CHEST:  He does have crackles at the right base.  CARDIOVASCULAR SYSTEM:  Regular rhythm.  No murmur heard.  ABDOMEN:  Scaphoid, soft and nontender.  There are no masses.  EXTREMITIES:  Without edema.  He has 1+ dorsalis pedis pulses  bilaterally.  The feet are warm.  There is no calf tenderness.  SKIN:  Warm and dry.  There are no ulcerations.  CENTRAL NERVOUS SYSTEM:  Cranial nerves II-XII are grossly intact.  He  has no focal neurologic deficits.   LABORATORY DATA:  His white count is 6.8, hemoglobin 13.8, MCV 95,  platelets 192.  He has a normal differential.  His ISTAT chemistry shows  a sodium of 141, potassium 4.1, chloride 108, BUN 21, creatinine 1.3,  CO2 25.  Beta-type natriuretic peptide is greater than 1700.  His chest  x-ray 2-view shows cardiomegaly with probably mild CHF, superimposed  infection cannot be excluded.  His EKG shows normal sinus rhythm with  left ventricular hypertrophy.   ASSESSMENT:  1. Acute  congestive heart failure.  Differential includes acute      myocardial infarction or decompensation due to uncontrolled      hypertension.  2. Hypertensive urgency.  3. History of hyperlipidemia.   PLAN:  1. We will admit this gentleman for diuresis and control of his blood      pressure with an ACE inhibitor.  2. We will give intravenous Lasix with potassium supplements.  We will      check a 2-D echo.  3. Despite the fact that he has had no palpitations, we will check his      thyroid functions as well as his lipid panel and serial cardiac      enzymes.   Other plans as per orders.      Vania Rea, M.D.  Electronically Signed     LC/MEDQ  D:  10/19/2008  T:  10/19/2008  Job:  [id number]

## 2010-08-13 ENCOUNTER — Encounter: Payer: Self-pay | Admitting: Cardiology

## 2010-08-25 ENCOUNTER — Telehealth: Payer: Self-pay | Admitting: Cardiology

## 2010-09-01 NOTE — Telephone Encounter (Signed)
Refill losartan 25 mg [facility] on [address] [phone]

## 2010-09-08 ENCOUNTER — Encounter: Payer: Self-pay | Admitting: Cardiology

## 2010-09-08 ENCOUNTER — Ambulatory Visit (INDEPENDENT_AMBULATORY_CARE_PROVIDER_SITE_OTHER): Payer: Medicare Other | Admitting: Cardiology

## 2010-09-08 DIAGNOSIS — I428 Other cardiomyopathies: Secondary | ICD-10-CM

## 2010-09-08 DIAGNOSIS — I10 Essential (primary) hypertension: Secondary | ICD-10-CM

## 2010-09-08 DIAGNOSIS — N259 Disorder resulting from impaired renal tubular function, unspecified: Secondary | ICD-10-CM

## 2010-09-08 DIAGNOSIS — E785 Hyperlipidemia, unspecified: Secondary | ICD-10-CM

## 2010-09-08 DIAGNOSIS — I2510 Atherosclerotic heart disease of native coronary artery without angina pectoris: Secondary | ICD-10-CM

## 2010-09-08 MED ORDER — LOSARTAN POTASSIUM 25 MG PO TABS: 25.0000 mg | ORAL_TABLET | Freq: Every day | ORAL | Status: DC

## 2010-09-08 MED ORDER — CARVEDILOL 12.5 MG PO TABS: ORAL_TABLET | ORAL | Status: DC

## 2010-09-08 MED ORDER — FUROSEMIDE 20 MG PO TABS: 20.0000 mg | ORAL_TABLET | Freq: Every day | ORAL | Status: DC

## 2010-09-08 NOTE — Assessment & Plan Note (Signed)
He had nonobstructive disease.  I will continue with risk reduction.

## 2010-09-08 NOTE — Patient Instructions (Addendum)
Please increase your Carvedilol to 12.5 mg tablets 1 and 1/2 tablet twice a day Decrease the Lasix (Furosemide) to 20 mg a day Continue other medications as listed Follow up in 1 year with Dr Antoine Poche.  You will receive a letter in the mail 2 months before you are due.  Please call us when you receive this letter to schedule your follow up appointment.

## 2010-09-08 NOTE — Progress Notes (Signed)
HPI The patient presents for followup of his cardiomyopathy. Since I last saw him he has had no new cardiovascular symptoms. His ejection fraction is actually improved from about 25% to 45%. The patient denies any new symptoms such as chest discomfort, neck or arm discomfort. There has been no new shortness of breath, PND or orthopnea. There have been no reported palpitations, presyncope or syncope.  He tolerated a slightly increased dose of Coreg at the last appt.   Allergies  Allergen Reactions  . Ace Inhibitors     REACTION: swelling in face August 2010    Current Outpatient Prescriptions  Medication Sig Dispense Refill  . aspirin 81 MG tablet Take 81 mg by mouth daily.        . carvedilol (COREG) 12.5 MG tablet Take 12.5 mg by mouth 2 (two) times daily.        . carvedilol (COREG) 3.125 MG tablet Take 3.125 mg by mouth. One twice a day to be take w 12.5 mg twice a day       . ferrous fumarate (HEMOCYTE - 106 MG FE) 325 (106 FE) MG TABS Take by mouth 2 (two) times daily.        . furosemide (LASIX) 40 MG tablet Take 40 mg by mouth daily.        Marland Kitchen losartan (COZAAR) 25 MG tablet        . pravastatin (PRAVACHOL) 40 MG tablet Take 40 mg by mouth daily.        Marland Kitchen spironolactone (ALDACTONE) 25 MG tablet Take 25 mg by mouth daily.        Marland Kitchen DISCONTD: losartan (COZAAR) 25 MG tablet TAKE ONE TABLET BY MOUTH TWICE DAILY.  30 tablet  6  . DISCONTD: pravastatin (PRAVACHOL) 20 MG tablet Take 20 mg by mouth daily.          Past Medical History  Diagnosis Date  . Hyperlipidemia     transient history, controlled on diet  . Hypertension   . Cardiomyopathy     newly diagnosed (EF 20%)  . CAD (coronary artery disease)     non obstructive. Left main normal. LAd proximal long 25% stenosis, termingating as focal 50% prox to mid lesion. First & second diag were small, normal. Circumflex in proximal av groove had luminal irregularities.. Was large mid obtuse marg which was branching, scattered luminal  irregul. Infer branch did supply some septal perforators. Was PDA,small & normal.  < 1.66mm. There was prox 95% stenosis    No past surgical history on file.  ROS:   As stated in the HPI and negative for all other systems.  PHYSICAL EXAM BP 131/73  Pulse 72  Resp 16  Ht 5\' 11"  (1.803 m)  Wt 158 lb (71.668 kg)  BMI 22.04 kg/m2 GENERAL:  Well appearing HEENT:  Pupils equal round and reactive, fundi not visualized, oral mucosa unremarkable NECK:  No jugular venous distention, waveform within normal limits, carotid upstroke brisk and symmetric, no bruits, no thyromegaly LYMPHATICS:  No cervical, inguinal adenopathy LUNGS:  Clear to auscultation bilaterally BACK:  No CVA tenderness CHEST:  Unremarkable HEART:  PMI not displaced or sustained,S1 and S2 within normal limits, no S3, no S4, no clicks, no rubs, no murmurs ABD:  Flat, positive bowel sounds normal in frequency in pitch, no bruits, no rebound, no guarding, no midline pulsatile mass, no hepatomegaly, no splenomegaly EXT:  2 plus pulses throughout, no edema, no cyanosis no clubbing SKIN:  No rashes no nodules NEURO:  Cranial nerves II through XII grossly intact, motor grossly intact throughout [facility]:  Cognitively intact, oriented to person place and time   EKG:  Sinus rhythm, rate 74, axis within normal limits, intervals within normal limits, no acute ST-T wave changes.   ASSESSMENT AND PLAN

## 2010-09-08 NOTE — Assessment & Plan Note (Signed)
I am treating this in the context of treating his cardiomyopathy.

## 2010-09-08 NOTE — Assessment & Plan Note (Signed)
I will increase his Coreg and reduce his lasix to 20 mg daily.

## 2010-10-21 ENCOUNTER — Other Ambulatory Visit: Payer: Self-pay | Admitting: Cardiology

## 2010-10-31 ENCOUNTER — Encounter: Payer: Self-pay | Admitting: Cardiology

## 2010-12-02 ENCOUNTER — Encounter (HOSPITAL_BASED_OUTPATIENT_CLINIC_OR_DEPARTMENT_OTHER): Payer: Medicare Other | Admitting: Oncology

## 2010-12-02 ENCOUNTER — Other Ambulatory Visit: Payer: Self-pay | Admitting: Oncology

## 2010-12-02 DIAGNOSIS — D892 Hypergammaglobulinemia, unspecified: Secondary | ICD-10-CM

## 2010-12-02 DIAGNOSIS — D472 Monoclonal gammopathy: Secondary | ICD-10-CM

## 2010-12-02 LAB — CBC WITH DIFFERENTIAL/PLATELET
Basophils Absolute: 0 10*3/uL (ref 0.0–0.1)
Eosinophils Absolute: 0.4 10*3/uL (ref 0.0–0.5)
HGB: 10.2 g/dL — ABNORMAL LOW (ref 13.0–17.1)
MONO#: 0.6 10*3/uL (ref 0.1–0.9)
NEUT#: 2.5 10*3/uL (ref 1.5–6.5)
RDW: 13.9 % (ref 11.0–14.6)
lymph#: 2 10*3/uL (ref 0.9–3.3)

## 2010-12-04 LAB — SPEP & IFE WITH QIG
Albumin ELP: 51.8 % — ABNORMAL LOW (ref 55.8–66.1)
Alpha-1-Globulin: 5 % — ABNORMAL HIGH (ref 2.9–4.9)
Beta 2: 6 % (ref 3.2–6.5)
IgM, Serum: 189 mg/dL (ref 41–251)
Total Protein, Serum Electrophoresis: 6.8 g/dL (ref 6.0–8.3)

## 2010-12-04 LAB — COMPREHENSIVE METABOLIC PANEL
Albumin: 3.7 g/dL (ref 3.5–5.2)
Alkaline Phosphatase: 82 U/L (ref 39–117)
Glucose, Bld: 84 mg/dL (ref 70–99)
Potassium: 5 mEq/L (ref 3.5–5.3)
Sodium: 139 mEq/L (ref 135–145)
Total Protein: 6.8 g/dL (ref 6.0–8.3)

## 2010-12-04 LAB — KAPPA/LAMBDA LIGHT CHAINS: Lambda Free Lght Chn: 2.12 mg/dL (ref 0.57–2.63)

## 2010-12-08 ENCOUNTER — Other Ambulatory Visit (HOSPITAL_COMMUNITY): Payer: Federal, State, Local not specified - PPO

## 2010-12-08 ENCOUNTER — Ambulatory Visit (HOSPITAL_COMMUNITY)
Admission: RE | Admit: 2010-12-08 | Discharge: 2010-12-08 | Disposition: A | Discharge status: Home / Self Care | Payer: Medicare Other | Source: Ambulatory Visit | Attending: Oncology | Admitting: Oncology

## 2010-12-08 DIAGNOSIS — M5030 Other cervical disc degeneration, unspecified cervical region: Secondary | ICD-10-CM | POA: Insufficient documentation

## 2010-12-08 DIAGNOSIS — R799 Abnormal finding of blood chemistry, unspecified: Secondary | ICD-10-CM | POA: Insufficient documentation

## 2010-12-08 DIAGNOSIS — IMO0002 Reserved for concepts with insufficient information to code with codable children: Secondary | ICD-10-CM | POA: Insufficient documentation

## 2010-12-08 DIAGNOSIS — K089 Disorder of teeth and supporting structures, unspecified: Secondary | ICD-10-CM | POA: Insufficient documentation

## 2010-12-08 DIAGNOSIS — D892 Hypergammaglobulinemia, unspecified: Secondary | ICD-10-CM

## 2010-12-23 ENCOUNTER — Encounter (HOSPITAL_BASED_OUTPATIENT_CLINIC_OR_DEPARTMENT_OTHER): Payer: Medicare Other | Admitting: Oncology

## 2010-12-23 DIAGNOSIS — D472 Monoclonal gammopathy: Secondary | ICD-10-CM

## 2010-12-23 DIAGNOSIS — D649 Anemia, unspecified: Secondary | ICD-10-CM

## 2011-02-02 ENCOUNTER — Other Ambulatory Visit (HOSPITAL_COMMUNITY): Payer: Self-pay | Admitting: *Deleted

## 2011-02-03 ENCOUNTER — Encounter (HOSPITAL_COMMUNITY)
Admission: RE | Admit: 2011-02-03 | Discharge: 2011-02-03 | Disposition: A | Discharge status: Home / Self Care | Payer: Medicare Other | Source: Ambulatory Visit | Attending: Nephrology | Admitting: Nephrology

## 2011-02-03 DIAGNOSIS — D649 Anemia, unspecified: Secondary | ICD-10-CM | POA: Insufficient documentation

## 2011-02-03 MED ORDER — FERUMOXYTOL INJECTION 510 MG/17 ML: 510.0000 mg | INTRAVENOUS | Status: DC

## 2011-02-03 MED ORDER — FERUMOXYTOL INJECTION 510 MG/17 ML
INTRAVENOUS | Status: AC
  Administered 2011-02-03: 510 mg via INTRAVENOUS
  Filled 2011-02-03: qty 17

## 2011-02-09 ENCOUNTER — Encounter (HOSPITAL_COMMUNITY)
Admission: RE | Admit: 2011-02-09 | Discharge: 2011-02-09 | Disposition: A | Discharge status: Home / Self Care | Payer: Medicare Other | Source: Ambulatory Visit | Attending: Nephrology | Admitting: Nephrology

## 2011-02-09 DIAGNOSIS — D649 Anemia, unspecified: Secondary | ICD-10-CM | POA: Insufficient documentation

## 2011-02-09 MED ORDER — FERUMOXYTOL INJECTION 510 MG/17 ML
INTRAVENOUS | Status: AC
  Administered 2011-02-09: 510 mg via INTRAVENOUS
  Filled 2011-02-09: qty 17

## 2011-02-09 MED ORDER — FERUMOXYTOL INJECTION 510 MG/17 ML: 510.0000 mg | INTRAVENOUS | Status: DC

## 2011-02-17 ENCOUNTER — Other Ambulatory Visit: Payer: Self-pay | Admitting: Cardiology

## 2011-03-13 ENCOUNTER — Other Ambulatory Visit: Payer: Self-pay | Admitting: Cardiology

## 2011-06-23 ENCOUNTER — Other Ambulatory Visit (HOSPITAL_BASED_OUTPATIENT_CLINIC_OR_DEPARTMENT_OTHER): Payer: Medicare Other | Admitting: Lab

## 2011-06-23 DIAGNOSIS — D472 Monoclonal gammopathy: Secondary | ICD-10-CM

## 2011-06-23 LAB — CBC WITH DIFFERENTIAL/PLATELET
Basophils Absolute: 0 10*3/uL (ref 0.0–0.1)
Eosinophils Absolute: 0.2 10*3/uL (ref 0.0–0.5)
HGB: 11.1 g/dL — ABNORMAL LOW (ref 13.0–17.1)
MONO#: 0.4 10*3/uL (ref 0.1–0.9)
MONO%: 6.7 % (ref 0.0–14.0)
NEUT#: 3.4 10*3/uL (ref 1.5–6.5)
RBC: 3.51 10*6/uL — ABNORMAL LOW (ref 4.20–5.82)
RDW: 14.6 % (ref 11.0–14.6)
WBC: 5.8 10*3/uL (ref 4.0–10.3)
lymph#: 1.8 10*3/uL (ref 0.9–3.3)

## 2011-06-25 LAB — SPEP & IFE WITH QIG
Albumin ELP: 46.7 % — ABNORMAL LOW (ref 55.8–66.1)
Alpha-1-Globulin: 6.2 % — ABNORMAL HIGH (ref 2.9–4.9)
Alpha-2-Globulin: 12.3 % — ABNORMAL HIGH (ref 7.1–11.8)
Beta 2: 5.9 % (ref 3.2–6.5)
IgM, Serum: 195 mg/dL (ref 41–251)

## 2011-06-25 LAB — COMPREHENSIVE METABOLIC PANEL
Albumin: 3.2 g/dL — ABNORMAL LOW (ref 3.5–5.2)
Alkaline Phosphatase: 96 U/L (ref 39–117)
BUN: 20 mg/dL (ref 6–23)
Calcium: 9 mg/dL (ref 8.4–10.5)
Chloride: 107 mEq/L (ref 96–112)
Glucose, Bld: 110 mg/dL — ABNORMAL HIGH (ref 70–99)
Potassium: 4.3 mEq/L (ref 3.5–5.3)
Sodium: 140 mEq/L (ref 135–145)
Total Protein: 6.7 g/dL (ref 6.0–8.3)

## 2011-06-25 LAB — KAPPA/LAMBDA LIGHT CHAINS
Kappa:Lambda Ratio: 1.81 — ABNORMAL HIGH (ref 0.26–1.65)
Lambda Free Lght Chn: 2.51 mg/dL (ref 0.57–2.63)

## 2011-06-30 ENCOUNTER — Telehealth: Payer: Self-pay | Admitting: Oncology

## 2011-06-30 ENCOUNTER — Ambulatory Visit (HOSPITAL_BASED_OUTPATIENT_CLINIC_OR_DEPARTMENT_OTHER): Payer: Medicare Other | Admitting: Oncology

## 2011-06-30 VITALS — BP 93/56 | HR 81 | Temp 97.5°F | Ht 70.0 in | Wt 153.5 lb

## 2011-06-30 DIAGNOSIS — D472 Monoclonal gammopathy: Secondary | ICD-10-CM

## 2011-06-30 DIAGNOSIS — D649 Anemia, unspecified: Secondary | ICD-10-CM

## 2011-06-30 DIAGNOSIS — D729 Disorder of white blood cells, unspecified: Secondary | ICD-10-CM

## 2011-06-30 NOTE — Telephone Encounter (Signed)
Gave pt appt calendar for 06/29/12 lab and MD

## 2011-06-30 NOTE — Progress Notes (Signed)
Hematology and Oncology Follow Up Visit  Luis Chen [id number] 02-May-1940 71 y.o. 06/30/2011 10:28 AM  CC: Ace Gins, MD  Terrial Rhodes, M.D.  Rollene Rotunda, MD, [facility]    Principle Diagnosis: :  This is a 71 year old gentleman with a monoclonal protein IgG subtype, most likely represents an monoclonal gammopathy of unknown significance.  No evidence of any plasma cell myeloma.   Interim History: [name] presents today for a followup visit.  Pleasant 71 year old gentleman who I saw for the first time back in September of 2012.  At that time,  he had a repeat serum protein electrophoresis which showed really no M-spike detected.  His IgG level was normal at 1510.  IgA level was normal at 160, IgM was 189.  His kappa free light chain was slightly elevated at 4.11 with a kappa to lambda ratio of 1.94, upper limit of normal is 1.65.  Skeletal survey was unremarkable.  [name] is asymptomatic.  He had not reported any specific symptoms of weakness, fatigue or tiredness.  He had not reported any bony pain.  Had not had any pathological fractures.  He had not had any major changes in his performance status. He is currently on active follow up only.  He is completely asymptomatic at this point and report no symptoms.   Medications: I have reviewed the patient's current medications. Current outpatient prescriptions:aspirin 81 MG tablet, Take 81 mg by mouth daily.  , Disp: , Rfl: ;  carvedilol (COREG) 12.5 MG tablet, TAKE ONE TABLET BY MOUTH TWICE DAILY, Disp: 60 tablet, Rfl: 6;  ferrous fumarate (HEMOCYTE - 106 MG FE) 325 (106 FE) MG TABS, Take by mouth 2 (two) times daily.  , Disp: , Rfl: ;  furosemide (LASIX) 20 MG tablet, Take 1 tablet (20 mg total) by mouth daily., Disp: , Rfl:  losartan (COZAAR) 25 MG tablet, Take 1 tablet (25 mg total) by mouth daily., Disp: 30 tablet, Rfl: 11;  pravastatin (PRAVACHOL) 40 MG tablet, Take 40 mg by mouth daily.  , Disp: , Rfl:   Allergies:  Allergies    Allergen Reactions  . Ace Inhibitors     REACTION: swelling in face August 2010    Past Medical History, Surgical history, Social history, and Family History were reviewed and updated.  Review of Systems: Constitutional:  Negative for fever, chills, night sweats, anorexia, weight loss, pain. Cardiovascular: no chest pain or dyspnea on exertion Respiratory: no cough, shortness of breath, or wheezing Neurological: negative Dermatological: negative ENT: negative Skin: Negative. Gastrointestinal: negative Genito-Urinary: no dysuria, trouble voiding, or hematuria Hematological and Lymphatic: negative Breast: negative Musculoskeletal: negative Remaining ROS negative. Physical Exam: Blood pressure 93/56, pulse 81, temperature 97.5 F (36.4 C), temperature source Oral, height 5\' 10"  (1.778 m), weight 153 lb 8 oz (69.627 kg). ECOG: 0 General appearance: alert Head: Normocephalic, without obvious abnormality, atraumatic Neck: no adenopathy, no carotid bruit, no JVD, supple, symmetrical, trachea midline and thyroid not enlarged, symmetric, no tenderness/mass/nodules Lymph nodes: Cervical, supraclavicular, and axillary nodes normal. Heart:regular rate and rhythm, S1, S2 normal, no murmur, [place], rub or gallop Lung:chest clear, no wheezing, rales, normal symmetric air entry Abdomin: soft, non-tender, without masses or organomegaly EXT:no erythema, induration, or nodules   Lab Results: Lab Results  Component Value Date   WBC 5.8 06/23/2011   HGB 11.1* 06/23/2011   HCT 33.4* 06/23/2011   MCV 95.2 06/23/2011   PLT 365 06/23/2011     Chemistry      Component Value Date/Time  NA 140 06/23/2011 0934   K 4.3 06/23/2011 0934   CL 107 06/23/2011 0934   CO2 25 06/23/2011 0934   BUN 20 06/23/2011 0934   CREATININE 1.36* 06/23/2011 0934      Component Value Date/Time   CALCIUM 9.0 06/23/2011 0934   ALKPHOS 96 06/23/2011 0934   AST 12 06/23/2011 0934   ALT <8 06/23/2011 0934   BILITOT 0.3  06/23/2011 0934     Results for Luis, Chen (MRN [id number]) as of 06/30/2011 10:30  Ref. Range 06/23/2011 09:34  Kappa free light chain Latest Range: 0.33-1.94 mg/dL 4.09 (H)  Kappa:Lambda Ratio Latest Range: 0.26-1.65  1.81 (H)  Lambda Free Lght Chn Latest Range: 0.57-2.63 mg/dL 8.11  Albumin ELP Latest Range: 55.8-66.1 % 46.7 (L)  COMMENT (PROTEIN ELECTROPHOR) No range found *  Alpha-1-Globulin Latest Range: 2.9-4.9 % 6.2 (H)  Alpha-2-Globulin Latest Range: 7.1-11.8 % 12.3 (H)  Beta Globulin Latest Range: 4.7-7.2 % 4.7  Beta 2 Latest Range: 3.2-6.5 % 5.9  Gamma Globulin Latest Range: 11.1-18.8 % 24.2 (H)  M-SPIKE, % No range found NOT DET  SPE Interp. No range found *  IgG (Immunoglobin G), Serum Latest Range: [phone] mg/dL 9147 (H)  IgA Latest Range: 68-379 mg/dL 829  IgM, Serum Latest Range: 41-251 mg/dL 562  Total Protein, serum electrophor Latest Range: 6.0-8.3 g/dL 6.7    Impression and Plan:  This is a 71 year old gentleman with the following issues:   1. A monoclonal protein IgG subtype.  His M-spike is really no longer detected.  His IgG level is slightly elevatedl.  He has really no evidence of any clear-cut end-organ damage.  He does have a renal insufficiency which could be related to his cardiomyopathy and hypertension.  I do not think this is related to his plasma cell disorder.  However, I think it is important to keep monitoring his serum protein electrophoresis, and repeat it in about 12 months.  If he develops any further cytopenia or increase in his protein electrophoresis, then we will re-stage him without a bone marrow biopsy at that time.  2. Anemia.  This is normocytic normochromic, probably related to his chronic disease, could be also related to his renal insufficiency.  Followup will be in 12 months' time, obviously sooner if there is any reason to.     [facility], MD 4/23/201310:28 AM

## 2011-07-13 ENCOUNTER — Other Ambulatory Visit (HOSPITAL_COMMUNITY): Payer: Self-pay | Admitting: *Deleted

## 2011-07-14 ENCOUNTER — Encounter (HOSPITAL_COMMUNITY)
Admission: RE | Admit: 2011-07-14 | Discharge: 2011-07-14 | Disposition: A | Discharge status: Home / Self Care | Payer: Medicare Other | Source: Ambulatory Visit | Attending: Nephrology | Admitting: Nephrology

## 2011-07-14 DIAGNOSIS — N182 Chronic kidney disease, stage 2 (mild): Secondary | ICD-10-CM | POA: Insufficient documentation

## 2011-07-14 DIAGNOSIS — D638 Anemia in other chronic diseases classified elsewhere: Secondary | ICD-10-CM | POA: Insufficient documentation

## 2011-07-14 LAB — POCT HEMOGLOBIN-HEMACUE: Hemoglobin: 11.7 g/dL — ABNORMAL LOW (ref 13.0–17.0)

## 2011-07-14 MED ORDER — EPOETIN ALFA 20000 UNIT/ML IJ SOLN
INTRAMUSCULAR | Status: AC
  Filled 2011-07-14: qty 1

## 2011-07-14 MED ORDER — EPOETIN ALFA 20000 UNIT/ML IJ SOLN
20000.0000 [IU] | INTRAMUSCULAR | Status: DC
  Administered 2011-07-14: 20000 [IU] via SUBCUTANEOUS

## 2011-08-11 ENCOUNTER — Encounter (HOSPITAL_COMMUNITY): Payer: Medicare Other

## 2011-08-25 ENCOUNTER — Encounter: Payer: Self-pay | Admitting: Cardiology

## 2011-11-05 ENCOUNTER — Other Ambulatory Visit (HOSPITAL_COMMUNITY): Payer: Self-pay | Admitting: *Deleted

## 2011-11-06 ENCOUNTER — Encounter (HOSPITAL_COMMUNITY)
Admission: RE | Admit: 2011-11-06 | Discharge: 2011-11-06 | Disposition: A | Discharge status: Home / Self Care | Payer: Medicare Other | Source: Ambulatory Visit | Attending: Nephrology | Admitting: Nephrology

## 2011-11-06 DIAGNOSIS — D638 Anemia in other chronic diseases classified elsewhere: Secondary | ICD-10-CM | POA: Insufficient documentation

## 2011-11-06 DIAGNOSIS — N182 Chronic kidney disease, stage 2 (mild): Secondary | ICD-10-CM | POA: Insufficient documentation

## 2011-11-06 LAB — IRON AND TIBC
Iron: 66 ug/dL (ref 42–135)
Saturation Ratios: 28 % (ref 20–55)
TIBC: 232 ug/dL (ref 215–435)

## 2011-11-06 LAB — RENAL FUNCTION PANEL
Albumin: 3.5 g/dL (ref 3.5–5.2)
CO2: 25 mEq/L (ref 19–32)
Chloride: 104 mEq/L (ref 96–112)
GFR calc Af Amer: 45 mL/min — ABNORMAL LOW (ref >90)
GFR calc non Af Amer: 38 mL/min — ABNORMAL LOW (ref >90)
Potassium: 4 mEq/L (ref 3.5–5.1)

## 2011-11-06 MED ORDER — EPOETIN ALFA 20000 UNIT/ML IJ SOLN
20000.0000 [IU] | INTRAMUSCULAR | Status: DC
  Administered 2011-11-06: 20000 [IU] via SUBCUTANEOUS
  Filled 2011-11-06: qty 1

## 2011-11-17 ENCOUNTER — Encounter: Payer: Self-pay | Admitting: Cardiology

## 2011-11-17 ENCOUNTER — Ambulatory Visit (INDEPENDENT_AMBULATORY_CARE_PROVIDER_SITE_OTHER): Payer: Medicare Other | Admitting: Cardiology

## 2011-11-17 ENCOUNTER — Telehealth: Payer: Self-pay | Admitting: Cardiology

## 2011-11-17 VITALS — BP 88/50 | HR 72 | Ht 70.0 in | Wt 152.8 lb

## 2011-11-17 DIAGNOSIS — I428 Other cardiomyopathies: Secondary | ICD-10-CM

## 2011-11-17 DIAGNOSIS — I2510 Atherosclerotic heart disease of native coronary artery without angina pectoris: Secondary | ICD-10-CM

## 2011-11-17 NOTE — Patient Instructions (Addendum)
The current medical regimen is effective;  continue present plan and medications.  Your physician has requested that you have an echocardiogram. Echocardiography is a painless test that uses sound waves to create images of your heart. It provides your doctor with information about the size and shape of your heart and how well your heart's chambers and valves are working. This procedure takes approximately one hour. There are no restrictions for this procedure.  Your physician has requested that you have an exercise tolerance test. For further information please visit [email or web address]. Please also follow instruction sheet, as given.  Follow up will be based on these results. 

## 2011-11-17 NOTE — Telephone Encounter (Signed)
Pt was in today, there is a discrepancy in med list, pls call

## 2011-11-17 NOTE — Progress Notes (Signed)
HPI The patient presents for followup of his cardiomyopathy. Since I last saw him he has had no new cardiovascular symptoms. His ejection fraction is actually improved from about 25% to 45%. The patient denies any new symptoms such as chest discomfort, neck or arm discomfort. There has been no new shortness of breath, PND or orthopnea. There have been no reported palpitations, presyncope or syncope.  There was some confusion today about the  dose of Lasix as he had 2 different lists. He thinks he is taking 20 mg. 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  Allergies  Allergen Reactions  . Ace Inhibitors     REACTION: swelling in face August 2010    Current Outpatient Prescriptions  Medication Sig Dispense Refill  . aspirin 81 MG tablet Take 81 mg by mouth daily.        . carvedilol (COREG) 12.5 MG tablet TAKE ONE TABLET BY MOUTH TWICE DAILY  60 tablet  6  . FERROUS GLUCONATE IRON PO Take 240 mg by mouth daily.      . furosemide (LASIX) 40 MG tablet Take 40 mg by mouth daily.      Marland Kitchen losartan (COZAAR) 25 MG tablet Take 1 tablet (25 mg total) by mouth daily.  30 tablet  11  . pravastatin (PRAVACHOL) 40 MG tablet Take 40 mg by mouth daily.          Past Medical History  Diagnosis Date  . Hyperlipidemia     transient history, controlled on diet  . Hypertension   . Cardiomyopathy     newly diagnosed (EF 20%)  . CAD (coronary artery disease)     non obstructive. Left main normal. LAd proximal long 25% stenosis, termingating as focal 50% prox to mid lesion. First & second diag were small, normal. Circumflex in proximal av groove had luminal irregularities.. Was large mid obtuse marg which was branching, scattered luminal irregul. Infer branch did supply some septal perforators. Was PDA,small & normal.  < 1.16mm. There was prox 95% stenosis    No past surgical history on file.  ROS:   As stated in the HPI and negative for all other systems.  PHYSICAL  EXAM BP 88/50  Pulse 72  Ht 5\' 10"  (1.778 m)  Wt 152 lb 12.8 oz (69.31 kg)  BMI 21.92 kg/m2 GENERAL:  Well appearing HEENT:  Pupils equal round and reactive, fundi not visualized, oral mucosa unremarkable, poor dentition NECK:  No jugular venous distention, waveform within normal limits, carotid upstroke brisk and symmetric, no bruits, no thyromegaly LYMPHATICS:  No cervical, inguinal adenopathy LUNGS:  Clear to auscultation bilaterally BACK:  No CVA tenderness CHEST:  Unremarkable HEART:  PMI not displaced or sustained,S1 and S2 within normal limits, no S3, no S4, no clicks, no rubs, no murmurs ABD:  Flat, positive bowel sounds normal in frequency in pitch, no bruits, no rebound, no guarding, no midline pulsatile mass, no hepatomegaly, no splenomegaly EXT:  2 plus pulses throughout, no edema, no cyanosis no clubbing SKIN:  No rashes no nodules NEURO:  Cranial nerves II through XII grossly intact, motor grossly intact throughout PSYCH:  Cognitively intact, oriented to person place and time   EKG:  Sinus rhythm, rate 72, axis within normal limits, intervals within normal limits, no acute ST-T wave changes.  11/17/2011   ASSESSMENT AND PLAN   CAD -  He is having no new symptoms. I would like for him to  come back for a screening exercise treadmill test. He had small vessel and nonobstructive disease in 2010. In particular I would like for him to get a prescription for exercise.   CARDIOMYOPATHY -  I would like to reassess his ejection fraction with an echocardiogram. I cannot titrate his medications because of his blood pressure. He had 2 different listed medications and he's going to actually bring the bottles. Of note particular I want him to only been taking 20 mg of Lasix. We can review this at the time of his treadmill. I did review is creatinine which is mildly elevated but stable and was drawn the other day.   ESSENTIAL HYPERTENSION, BENIGN -  This is being managed in the  context of treating his CHF

## 2011-11-19 ENCOUNTER — Ambulatory Visit (HOSPITAL_COMMUNITY): Payer: Medicare Other | Attending: Internal Medicine | Admitting: Radiology

## 2011-11-19 DIAGNOSIS — I10 Essential (primary) hypertension: Secondary | ICD-10-CM | POA: Insufficient documentation

## 2011-11-19 DIAGNOSIS — I428 Other cardiomyopathies: Secondary | ICD-10-CM

## 2011-11-19 DIAGNOSIS — I080 Rheumatic disorders of both mitral and aortic valves: Secondary | ICD-10-CM | POA: Insufficient documentation

## 2011-11-19 DIAGNOSIS — I369 Nonrheumatic tricuspid valve disorder, unspecified: Secondary | ICD-10-CM | POA: Insufficient documentation

## 2011-11-19 DIAGNOSIS — I2510 Atherosclerotic heart disease of native coronary artery without angina pectoris: Secondary | ICD-10-CM | POA: Insufficient documentation

## 2011-11-19 NOTE — Progress Notes (Signed)
Echocardiogram performed.  

## 2011-11-20 ENCOUNTER — Telehealth: Payer: Self-pay

## 2011-11-20 NOTE — Telephone Encounter (Signed)
Returning patient 's call about his med list

## 2011-11-24 NOTE — Telephone Encounter (Signed)
Pt returned to office and has RX for Pravastatin 80 mg a day not 40 mg a day.  Will forward information to Dr Antoine Poche as his request.

## 2011-11-27 ENCOUNTER — Telehealth: Payer: Self-pay

## 2011-11-27 NOTE — Telephone Encounter (Signed)
Lm to call office

## 2011-11-30 NOTE — Telephone Encounter (Signed)
F/u   Patient returning nurse call he can be reached at [phone]

## 2011-11-30 NOTE — Telephone Encounter (Signed)
Patient called stated he was calling to find out echo results results.Patient was told echo revealed normal EF now and a copy was sent to PCP Dr.Elizabeth Duanne Guess.

## 2011-12-03 ENCOUNTER — Encounter: Payer: Self-pay | Admitting: Physician Assistant

## 2011-12-03 ENCOUNTER — Ambulatory Visit (INDEPENDENT_AMBULATORY_CARE_PROVIDER_SITE_OTHER): Payer: Medicare Other | Admitting: Physician Assistant

## 2011-12-03 DIAGNOSIS — I2510 Atherosclerotic heart disease of native coronary artery without angina pectoris: Secondary | ICD-10-CM

## 2011-12-03 NOTE — Procedures (Signed)
Exercise Treadmill Test  Pre-Exercise Testing Evaluation Rhythm: sinus bradycardia  Rate: 57   PR:  .19 QRS:  .08  QT:  .40 QTc: .38     Test  Exercise Tolerance Test Ordering MD: Angelina Sheriff, MD  Interpreting MD: Tereso Newcomer , PA-C  Unique Test No: 1  Treadmill:  1  Indication for ETT: known ASHD  Contraindication to ETT: No   Stress Modality: exercise - treadmill  Cardiac Imaging Performed: non   Protocol: standard Bruce - maximal  Max BP:  146/61  Max MPHR (bpm):  147 85% MPR (bpm):  125  MPHR obtained (bpm):  100 % MPHR obtained:  65%  Reached 85% MPHR (min:sec):  n/a Total Exercise Time (min-sec):  6:13  Workload in METS:  7.2 Borg Scale: 11  Reason ETT Terminated:  patient's desire to stop    ST Segment Analysis At Rest: normal ST segments - no evidence of significant ST depression With Exercise: non-specific ST changes  Other Information Arrhythmia:  Occasional PVCs Angina during ETT:  absent (0) Quality of ETT:  non-diagnostic  ETT Interpretation:  Patient did not achieve target heart rate; submaximal study  Comments: Poor exercise tolerance. No chest pain. Normal BP response to exercise. Submaximal exercise.   Non-specific ST changes noted at submaximal heart rate.  Recommendations: Follow up with Dr. Rollene Rotunda as directed. Will d/c Dr. Rollene Rotunda whether or not to pursue nuclear stress test. Tereso Newcomer, PA-C  10:15 AM 12/03/2011

## 2011-12-04 ENCOUNTER — Encounter (HOSPITAL_COMMUNITY)
Admission: RE | Admit: 2011-12-04 | Discharge: 2011-12-04 | Disposition: A | Discharge status: Home / Self Care | Payer: Medicare Other | Source: Ambulatory Visit | Attending: Nephrology | Admitting: Nephrology

## 2011-12-04 DIAGNOSIS — D638 Anemia in other chronic diseases classified elsewhere: Secondary | ICD-10-CM | POA: Insufficient documentation

## 2011-12-04 DIAGNOSIS — N182 Chronic kidney disease, stage 2 (mild): Secondary | ICD-10-CM | POA: Insufficient documentation

## 2011-12-04 MED ORDER — EPOETIN ALFA 20000 UNIT/ML IJ SOLN
20000.0000 [IU] | INTRAMUSCULAR | Status: DC
  Administered 2011-12-04: 20000 [IU] via SUBCUTANEOUS
  Filled 2011-12-04: qty 1

## 2012-01-01 ENCOUNTER — Encounter (HOSPITAL_COMMUNITY)
Admission: RE | Admit: 2012-01-01 | Discharge: 2012-01-01 | Disposition: A | Discharge status: Home / Self Care | Payer: Medicare Other | Source: Ambulatory Visit | Attending: Nephrology | Admitting: Nephrology

## 2012-01-01 DIAGNOSIS — N182 Chronic kidney disease, stage 2 (mild): Secondary | ICD-10-CM | POA: Insufficient documentation

## 2012-01-01 DIAGNOSIS — D638 Anemia in other chronic diseases classified elsewhere: Secondary | ICD-10-CM | POA: Insufficient documentation

## 2012-01-01 LAB — IRON AND TIBC
Iron: 60 ug/dL (ref 42–135)
Saturation Ratios: 23 % (ref 20–55)
TIBC: 260 ug/dL (ref 215–435)

## 2012-01-01 LAB — RENAL FUNCTION PANEL
Albumin: 3.7 g/dL (ref 3.5–5.2)
BUN: 23 mg/dL (ref 6–23)
CO2: 30 mEq/L (ref 19–32)
Chloride: 102 mEq/L (ref 96–112)
Creatinine, Ser: 1.48 mg/dL — ABNORMAL HIGH (ref 0.50–1.35)
Potassium: 4.2 mEq/L (ref 3.5–5.1)

## 2012-01-01 LAB — FERRITIN: Ferritin: 680 ng/mL — ABNORMAL HIGH (ref 22–322)

## 2012-01-01 MED ORDER — EPOETIN ALFA 20000 UNIT/ML IJ SOLN
20000.0000 [IU] | INTRAMUSCULAR | Status: DC
  Administered 2012-01-01: 20000 [IU] via SUBCUTANEOUS

## 2012-01-01 MED ORDER — EPOETIN ALFA 20000 UNIT/ML IJ SOLN
INTRAMUSCULAR | Status: AC
  Administered 2012-01-01: 20000 [IU] via SUBCUTANEOUS
  Filled 2012-01-01: qty 1

## 2012-01-28 ENCOUNTER — Other Ambulatory Visit (HOSPITAL_COMMUNITY): Payer: Self-pay | Admitting: *Deleted

## 2012-01-29 ENCOUNTER — Encounter (HOSPITAL_COMMUNITY): Payer: Medicare Other

## 2012-06-27 ENCOUNTER — Telehealth: Payer: Self-pay | Admitting: Oncology

## 2012-06-29 ENCOUNTER — Ambulatory Visit: Payer: Medicare Other | Admitting: Oncology

## 2012-06-29 ENCOUNTER — Other Ambulatory Visit: Payer: Medicare Other | Admitting: Lab

## 2012-07-27 ENCOUNTER — Other Ambulatory Visit: Payer: Self-pay | Admitting: Oncology

## 2012-07-27 DIAGNOSIS — D472 Monoclonal gammopathy: Secondary | ICD-10-CM

## 2012-07-28 ENCOUNTER — Ambulatory Visit: Payer: Medicare Other | Admitting: Oncology

## 2012-07-28 ENCOUNTER — Encounter: Payer: Self-pay | Admitting: Cardiology

## 2012-07-28 ENCOUNTER — Other Ambulatory Visit: Payer: Medicare Other | Admitting: Lab

## 2014-12-10 DIAGNOSIS — M170 Bilateral primary osteoarthritis of knee: Secondary | ICD-10-CM

## 2014-12-10 HISTORY — DX: Bilateral primary osteoarthritis of knee: M17.0

## 2015-03-20 DIAGNOSIS — I10 Essential (primary) hypertension: Secondary | ICD-10-CM | POA: Diagnosis not present

## 2015-03-20 DIAGNOSIS — E785 Hyperlipidemia, unspecified: Secondary | ICD-10-CM | POA: Diagnosis not present

## 2015-06-26 DIAGNOSIS — I10 Essential (primary) hypertension: Secondary | ICD-10-CM | POA: Diagnosis not present

## 2015-06-26 DIAGNOSIS — J301 Allergic rhinitis due to pollen: Secondary | ICD-10-CM | POA: Diagnosis not present

## 2015-06-26 DIAGNOSIS — N183 Chronic kidney disease, stage 3 (moderate): Secondary | ICD-10-CM | POA: Diagnosis not present

## 2015-06-26 DIAGNOSIS — M542 Cervicalgia: Secondary | ICD-10-CM | POA: Diagnosis not present

## 2015-06-26 DIAGNOSIS — D472 Monoclonal gammopathy: Secondary | ICD-10-CM | POA: Diagnosis not present

## 2015-06-26 DIAGNOSIS — I5020 Unspecified systolic (congestive) heart failure: Secondary | ICD-10-CM | POA: Diagnosis not present

## 2015-07-01 ENCOUNTER — Emergency Department (HOSPITAL_COMMUNITY): Payer: Medicare Other

## 2015-07-01 ENCOUNTER — Inpatient Hospital Stay (HOSPITAL_COMMUNITY)
Admission: EM | Admit: 2015-07-01 | Discharge: 2015-07-06 | DRG: 501 | Disposition: A | Discharge status: Home Health | Payer: Medicare Other | Attending: Internal Medicine | Admitting: Internal Medicine

## 2015-07-01 ENCOUNTER — Observation Stay (HOSPITAL_COMMUNITY): Payer: Medicare Other | Admitting: Certified Registered Nurse Anesthetist

## 2015-07-01 ENCOUNTER — Encounter (HOSPITAL_COMMUNITY): Payer: Self-pay | Admitting: Emergency Medicine

## 2015-07-01 ENCOUNTER — Encounter (HOSPITAL_COMMUNITY)
Admission: EM | Disposition: A | Discharge status: Home Health | Payer: Self-pay | Source: Home / Self Care | Attending: Internal Medicine

## 2015-07-01 DIAGNOSIS — M00862 Arthritis due to other bacteria, left knee: Secondary | ICD-10-CM | POA: Diagnosis not present

## 2015-07-01 DIAGNOSIS — D638 Anemia in other chronic diseases classified elsewhere: Secondary | ICD-10-CM | POA: Diagnosis present

## 2015-07-01 DIAGNOSIS — J9811 Atelectasis: Secondary | ICD-10-CM | POA: Diagnosis not present

## 2015-07-01 DIAGNOSIS — M064 Inflammatory polyarthropathy: Secondary | ICD-10-CM | POA: Diagnosis present

## 2015-07-01 DIAGNOSIS — I428 Other cardiomyopathies: Secondary | ICD-10-CM

## 2015-07-01 DIAGNOSIS — I10 Essential (primary) hypertension: Secondary | ICD-10-CM | POA: Diagnosis not present

## 2015-07-01 DIAGNOSIS — M25562 Pain in left knee: Secondary | ICD-10-CM | POA: Diagnosis not present

## 2015-07-01 DIAGNOSIS — R0602 Shortness of breath: Secondary | ICD-10-CM

## 2015-07-01 DIAGNOSIS — M159 Polyosteoarthritis, unspecified: Secondary | ICD-10-CM | POA: Diagnosis not present

## 2015-07-01 DIAGNOSIS — M7989 Other specified soft tissue disorders: Secondary | ICD-10-CM | POA: Diagnosis not present

## 2015-07-01 DIAGNOSIS — M6282 Rhabdomyolysis: Secondary | ICD-10-CM | POA: Diagnosis present

## 2015-07-01 DIAGNOSIS — I2510 Atherosclerotic heart disease of native coronary artery without angina pectoris: Secondary | ICD-10-CM | POA: Diagnosis not present

## 2015-07-01 DIAGNOSIS — M25462 Effusion, left knee: Secondary | ICD-10-CM

## 2015-07-01 DIAGNOSIS — I5022 Chronic systolic (congestive) heart failure: Secondary | ICD-10-CM | POA: Diagnosis not present

## 2015-07-01 DIAGNOSIS — R52 Pain, unspecified: Secondary | ICD-10-CM

## 2015-07-01 DIAGNOSIS — N183 Chronic kidney disease, stage 3 (moderate): Secondary | ICD-10-CM | POA: Diagnosis present

## 2015-07-01 DIAGNOSIS — M25471 Effusion, right ankle: Secondary | ICD-10-CM

## 2015-07-01 DIAGNOSIS — M009 Pyogenic arthritis, unspecified: Secondary | ICD-10-CM | POA: Diagnosis not present

## 2015-07-01 DIAGNOSIS — Z888 Allergy status to other drugs, medicaments and biological substances status: Secondary | ICD-10-CM

## 2015-07-01 DIAGNOSIS — E785 Hyperlipidemia, unspecified: Secondary | ICD-10-CM | POA: Diagnosis present

## 2015-07-01 DIAGNOSIS — I5032 Chronic diastolic (congestive) heart failure: Secondary | ICD-10-CM | POA: Diagnosis present

## 2015-07-01 DIAGNOSIS — M130 Polyarthritis, unspecified: Secondary | ICD-10-CM | POA: Diagnosis present

## 2015-07-01 DIAGNOSIS — I130 Hypertensive heart and chronic kidney disease with heart failure and stage 1 through stage 4 chronic kidney disease, or unspecified chronic kidney disease: Secondary | ICD-10-CM | POA: Diagnosis present

## 2015-07-01 DIAGNOSIS — M436 Torticollis: Secondary | ICD-10-CM | POA: Diagnosis not present

## 2015-07-01 DIAGNOSIS — N179 Acute kidney failure, unspecified: Secondary | ICD-10-CM | POA: Diagnosis not present

## 2015-07-01 DIAGNOSIS — R509 Fever, unspecified: Secondary | ICD-10-CM | POA: Diagnosis not present

## 2015-07-01 DIAGNOSIS — M542 Cervicalgia: Secondary | ICD-10-CM

## 2015-07-01 DIAGNOSIS — Z7982 Long term (current) use of aspirin: Secondary | ICD-10-CM

## 2015-07-01 DIAGNOSIS — E860 Dehydration: Secondary | ICD-10-CM | POA: Diagnosis present

## 2015-07-01 DIAGNOSIS — M549 Dorsalgia, unspecified: Secondary | ICD-10-CM

## 2015-07-01 HISTORY — DX: Effusion, left knee: M25.462

## 2015-07-01 HISTORY — PX: KNEE ARTHROSCOPY: SHX127

## 2015-07-01 HISTORY — DX: Polyarthritis, unspecified: M13.0

## 2015-07-01 HISTORY — DX: Anemia in other chronic diseases classified elsewhere: D63.8

## 2015-07-01 LAB — SEDIMENTATION RATE: Sed Rate: 125 mm/hr — ABNORMAL HIGH (ref 0–16)

## 2015-07-01 LAB — URINALYSIS, ROUTINE W REFLEX MICROSCOPIC
BILIRUBIN URINE: NEGATIVE
Glucose, UA: NEGATIVE mg/dL
Ketones, ur: 15 mg/dL — AB
Leukocytes, UA: NEGATIVE
Nitrite: NEGATIVE
Protein, ur: 30 mg/dL — AB
SPECIFIC GRAVITY, URINE: 1.018 (ref 1.005–1.030)
pH: 5.5 (ref 5.0–8.0)

## 2015-07-01 LAB — CBC WITH DIFFERENTIAL/PLATELET
Basophils Absolute: 0 K/uL (ref 0.0–0.1)
Basophils Relative: 0 %
Eosinophils Absolute: 0 K/uL (ref 0.0–0.7)
Eosinophils Relative: 0 %
HCT: 29.5 % — ABNORMAL LOW (ref 39.0–52.0)
Hemoglobin: 9.7 g/dL — ABNORMAL LOW (ref 13.0–17.0)
Lymphocytes Relative: 11 %
Lymphs Abs: 1 K/uL (ref 0.7–4.0)
MCH: 30.6 pg (ref 26.0–34.0)
MCHC: 32.9 g/dL (ref 30.0–36.0)
MCV: 93.1 fL (ref 78.0–100.0)
Monocytes Absolute: 1.1 K/uL — ABNORMAL HIGH (ref 0.1–1.0)
Monocytes Relative: 12 %
Neutro Abs: 7.3 K/uL (ref 1.7–7.7)
Neutrophils Relative %: 77 %
Platelets: 266 K/uL (ref 150–400)
RBC: 3.17 MIL/uL — ABNORMAL LOW (ref 4.22–5.81)
RDW: 14 % (ref 11.5–15.5)
WBC: 9.4 K/uL (ref 4.0–10.5)

## 2015-07-01 LAB — COMPREHENSIVE METABOLIC PANEL WITH GFR
ALT: 49 U/L (ref 17–63)
AST: 75 U/L — ABNORMAL HIGH (ref 15–41)
Albumin: 2.9 g/dL — ABNORMAL LOW (ref 3.5–5.0)
Alkaline Phosphatase: 155 U/L — ABNORMAL HIGH (ref 38–126)
Anion gap: 11 (ref 5–15)
BUN: 30 mg/dL — ABNORMAL HIGH (ref 6–20)
CO2: 25 mmol/L (ref 22–32)
Calcium: 9.4 mg/dL (ref 8.9–10.3)
Chloride: 107 mmol/L (ref 101–111)
Creatinine, Ser: 1.91 mg/dL — ABNORMAL HIGH (ref 0.61–1.24)
GFR calc Af Amer: 38 mL/min — ABNORMAL LOW
GFR calc non Af Amer: 32 mL/min — ABNORMAL LOW
Glucose, Bld: 130 mg/dL — ABNORMAL HIGH (ref 65–99)
Potassium: 4.4 mmol/L (ref 3.5–5.1)
Sodium: 143 mmol/L (ref 135–145)
Total Bilirubin: 1.4 mg/dL — ABNORMAL HIGH (ref 0.3–1.2)
Total Protein: 7.5 g/dL (ref 6.5–8.1)

## 2015-07-01 LAB — URINE MICROSCOPIC-ADD ON

## 2015-07-01 LAB — SURGICAL PCR SCREEN
MRSA, PCR: NEGATIVE
STAPHYLOCOCCUS AUREUS: NEGATIVE

## 2015-07-01 LAB — SYNOVIAL CELL COUNT + DIFF, W/ CRYSTALS
CRYSTALS FLUID: NONE SEEN
Lymphocytes-Synovial Fld: 5 % (ref 0–20)
Monocyte-Macrophage-Synovial Fluid: 24 % — ABNORMAL LOW (ref 50–90)
NEUTROPHIL, SYNOVIAL: 71 % — AB (ref 0–25)
WBC, SYNOVIAL: 39375 /mm3 — AB (ref 0–200)

## 2015-07-01 LAB — GRAM STAIN

## 2015-07-01 LAB — CK: CK TOTAL: 737 U/L — AB (ref 49–397)

## 2015-07-01 LAB — I-STAT CG4 LACTIC ACID, ED: Lactic Acid, Venous: 1.32 mmol/L (ref 0.5–2.0)

## 2015-07-01 LAB — C-REACTIVE PROTEIN: CRP: 33.2 mg/dL — ABNORMAL HIGH (ref <1.0)

## 2015-07-01 SURGERY — ARTHROSCOPY, KNEE
Anesthesia: General | Site: Knee | Laterality: Left

## 2015-07-01 MED ORDER — SODIUM CHLORIDE 0.9 % IV SOLN
INTRAVENOUS | Status: DC
  Administered 2015-07-01: 16:00:00 via INTRAVENOUS

## 2015-07-01 MED ORDER — ACETAMINOPHEN 500 MG PO TABS
1000.0000 mg | ORAL_TABLET | Freq: Once | ORAL | Status: AC
  Administered 2015-07-01: 1000 mg via ORAL
  Filled 2015-07-01: qty 2

## 2015-07-01 MED ORDER — MORPHINE SULFATE (PF) 2 MG/ML IV SOLN
1.0000 mg | INTRAVENOUS | Status: DC | PRN
  Administered 2015-07-02: 1 mg via INTRAVENOUS
  Filled 2015-07-01: qty 1

## 2015-07-01 MED ORDER — CEFAZOLIN SODIUM-DEXTROSE 2-4 GM/100ML-% IV SOLN
2.0000 g | INTRAVENOUS | Status: AC
  Administered 2015-07-01: 2 g via INTRAVENOUS

## 2015-07-01 MED ORDER — DEXTROSE-NACL 5-0.45 % IV SOLN: 100.0000 mL/h | INTRAVENOUS | Status: DC

## 2015-07-01 MED ORDER — TRAZODONE HCL 50 MG PO TABS: 25.0000 mg | ORAL_TABLET | Freq: Every evening | ORAL | Status: DC | PRN

## 2015-07-01 MED ORDER — LIDOCAINE HCL (CARDIAC) 20 MG/ML IV SOLN
INTRAVENOUS | Status: AC
  Filled 2015-07-01: qty 5

## 2015-07-01 MED ORDER — CHLORHEXIDINE GLUCONATE 4 % EX LIQD: 60.0000 mL | Freq: Once | CUTANEOUS | Status: DC

## 2015-07-01 MED ORDER — LACTATED RINGERS IV SOLN: INTRAVENOUS | Status: DC

## 2015-07-01 MED ORDER — ONDANSETRON HCL 4 MG/2ML IJ SOLN
INTRAMUSCULAR | Status: DC | PRN
  Administered 2015-07-01: 4 mg via INTRAVENOUS

## 2015-07-01 MED ORDER — SODIUM CHLORIDE 0.9 % IR SOLN
Status: DC | PRN
  Administered 2015-07-01 (×2): 3000 mL

## 2015-07-01 MED ORDER — FENTANYL CITRATE (PF) 100 MCG/2ML IJ SOLN
INTRAMUSCULAR | Status: DC | PRN
  Administered 2015-07-01 (×2): 50 ug via INTRAVENOUS

## 2015-07-01 MED ORDER — CEFTRIAXONE SODIUM 1 G IJ SOLR
1.0000 g | Freq: Once | INTRAMUSCULAR | Status: AC
  Administered 2015-07-01: 1 g via INTRAVENOUS
  Filled 2015-07-01: qty 10

## 2015-07-01 MED ORDER — SUCCINYLCHOLINE CHLORIDE 20 MG/ML IJ SOLN
INTRAMUSCULAR | Status: AC
  Filled 2015-07-01: qty 1

## 2015-07-01 MED ORDER — PROPOFOL 10 MG/ML IV BOLUS
INTRAVENOUS | Status: AC
  Filled 2015-07-01: qty 20

## 2015-07-01 MED ORDER — LIDOCAINE HCL (CARDIAC) 20 MG/ML IV SOLN
INTRAVENOUS | Status: DC | PRN
  Administered 2015-07-01: 50 mg via INTRAVENOUS

## 2015-07-01 MED ORDER — BUPIVACAINE-EPINEPHRINE 0.5% -1:200000 IJ SOLN
INTRAMUSCULAR | Status: DC | PRN
  Administered 2015-07-01: 20 mL

## 2015-07-01 MED ORDER — PHENYLEPHRINE 40 MCG/ML (10ML) SYRINGE FOR IV PUSH (FOR BLOOD PRESSURE SUPPORT)
PREFILLED_SYRINGE | INTRAVENOUS | Status: AC
Start: 2015-07-01 — End: 2015-07-01
  Filled 2015-07-01: qty 10

## 2015-07-01 MED ORDER — BUPIVACAINE-EPINEPHRINE (PF) 0.5% -1:200000 IJ SOLN
INTRAMUSCULAR | Status: AC
  Filled 2015-07-01: qty 30

## 2015-07-01 MED ORDER — POVIDONE-IODINE 10 % EX SWAB: 2.0000 "application " | Freq: Once | CUTANEOUS | Status: DC

## 2015-07-01 MED ORDER — DEXTROSE 5 % IV SOLN
1.0000 g | INTRAVENOUS | Status: DC
  Administered 2015-07-02 – 2015-07-04 (×3): 1 g via INTRAVENOUS
  Filled 2015-07-01 (×3): qty 10

## 2015-07-01 MED ORDER — ACETAMINOPHEN 650 MG RE SUPP: 650.0000 mg | Freq: Four times a day (QID) | RECTAL | Status: DC | PRN

## 2015-07-01 MED ORDER — SUCCINYLCHOLINE CHLORIDE 20 MG/ML IJ SOLN
INTRAMUSCULAR | Status: DC | PRN
  Administered 2015-07-01: 100 mg via INTRAVENOUS

## 2015-07-01 MED ORDER — HYDROMORPHONE HCL 1 MG/ML IJ SOLN: 0.2500 mg | INTRAMUSCULAR | Status: DC | PRN

## 2015-07-01 MED ORDER — ONDANSETRON HCL 4 MG/2ML IJ SOLN
INTRAMUSCULAR | Status: AC
  Filled 2015-07-01: qty 2

## 2015-07-01 MED ORDER — SODIUM CHLORIDE 0.9 % IV BOLUS (SEPSIS)
1000.0000 mL | Freq: Once | INTRAVENOUS | Status: AC
  Administered 2015-07-01: 1000 mL via INTRAVENOUS

## 2015-07-01 MED ORDER — VANCOMYCIN HCL IN DEXTROSE 1-5 GM/200ML-% IV SOLN
1000.0000 mg | INTRAVENOUS | Status: AC
  Administered 2015-07-02 – 2015-07-04 (×3): 1000 mg via INTRAVENOUS
  Filled 2015-07-01 (×3): qty 200

## 2015-07-01 MED ORDER — CEFAZOLIN SODIUM-DEXTROSE 2-4 GM/100ML-% IV SOLN
INTRAVENOUS | Status: AC
  Filled 2015-07-01: qty 100

## 2015-07-01 MED ORDER — FENTANYL CITRATE (PF) 250 MCG/5ML IJ SOLN
INTRAMUSCULAR | Status: AC
  Filled 2015-07-01: qty 5

## 2015-07-01 MED ORDER — PHENYLEPHRINE HCL 10 MG/ML IJ SOLN
INTRAMUSCULAR | Status: DC | PRN
Start: 2015-07-01 — End: 2015-07-01
  Administered 2015-07-01 (×4): 40 ug via INTRAVENOUS

## 2015-07-01 MED ORDER — LACTATED RINGERS IV SOLN
INTRAVENOUS | Status: DC | PRN
  Administered 2015-07-01: 12:00:00 via INTRAVENOUS

## 2015-07-01 MED ORDER — VANCOMYCIN HCL IN DEXTROSE 1-5 GM/200ML-% IV SOLN
1000.0000 mg | Freq: Once | INTRAVENOUS | Status: AC
  Administered 2015-07-01: 1000 mg via INTRAVENOUS
  Filled 2015-07-01: qty 200

## 2015-07-01 MED ORDER — MEPERIDINE HCL 25 MG/ML IJ SOLN: 6.2500 mg | INTRAMUSCULAR | Status: DC | PRN

## 2015-07-01 MED ORDER — ACETAMINOPHEN 325 MG PO TABS
650.0000 mg | ORAL_TABLET | Freq: Four times a day (QID) | ORAL | Status: DC | PRN
  Administered 2015-07-01 – 2015-07-05 (×5): 650 mg via ORAL
  Filled 2015-07-01 (×6): qty 2

## 2015-07-01 MED ORDER — LIDOCAINE-EPINEPHRINE 1 %-1:100000 IJ SOLN
10.0000 mL | Freq: Once | INTRAMUSCULAR | Status: AC
  Administered 2015-07-01: 10 mL
  Filled 2015-07-01: qty 1

## 2015-07-01 MED ORDER — CEFAZOLIN SODIUM-DEXTROSE 2-4 GM/100ML-% IV SOLN
2.0000 g | Freq: Four times a day (QID) | INTRAVENOUS | Status: AC
  Administered 2015-07-01 – 2015-07-02 (×3): 2 g via INTRAVENOUS
  Filled 2015-07-01 (×3): qty 100

## 2015-07-01 MED ORDER — ACETAMINOPHEN 500 MG PO TABS: 1000.0000 mg | ORAL_TABLET | Freq: Once | ORAL | Status: DC

## 2015-07-01 MED ORDER — PROPOFOL 10 MG/ML IV BOLUS
INTRAVENOUS | Status: DC | PRN
  Administered 2015-07-01: 120 mg via INTRAVENOUS
  Administered 2015-07-01: 30 mg via INTRAVENOUS

## 2015-07-01 SURGICAL SUPPLY — 45 items
BANDAGE ELASTIC 6 VELCRO ST LF (GAUZE/BANDAGES/DRESSINGS) ×2 IMPLANT
BANDAGE ESMARK 6X9 LF (GAUZE/BANDAGES/DRESSINGS) IMPLANT
BLADE CUTTER MENIS 5.5 (BLADE) ×2 IMPLANT
BLADE SURG ROTATE 9660 (MISCELLANEOUS) IMPLANT
BNDG CMPR 9X6 STRL LF SNTH (GAUZE/BANDAGES/DRESSINGS)
BNDG ESMARK 6X9 LF (GAUZE/BANDAGES/DRESSINGS)
BNDG GAUZE ELAST 4 BULKY (GAUZE/BANDAGES/DRESSINGS) ×2 IMPLANT
COVER SURGICAL LIGHT HANDLE (MISCELLANEOUS) ×3 IMPLANT
CUFF TOURNIQUET SINGLE 34IN LL (TOURNIQUET CUFF) IMPLANT
CUTTER MENISCUS 3.5MM 6/BX (BLADE) ×3 IMPLANT
DRAPE ARTHROSCOPY W/POUCH 114 (DRAPES) ×3 IMPLANT
DRAPE U-SHAPE 47X51 STRL (DRAPES) ×3 IMPLANT
DRSG PAD ABDOMINAL 8X10 ST (GAUZE/BANDAGES/DRESSINGS) IMPLANT
FACESHIELD WRAPAROUND (MASK) ×3 IMPLANT
FACESHIELD WRAPAROUND OR TEAM (MASK) ×1 IMPLANT
GAUZE SPONGE 4X4 12PLY STRL (GAUZE/BANDAGES/DRESSINGS) IMPLANT
GAUZE XEROFORM 1X8 LF (GAUZE/BANDAGES/DRESSINGS) ×3 IMPLANT
GLOVE BIO SURGEON STRL SZ7 (GLOVE) ×3 IMPLANT
GLOVE BIO SURGEON STRL SZ7.5 (GLOVE) ×3 IMPLANT
GLOVE BIOGEL PI IND STRL 7.0 (GLOVE) ×1 IMPLANT
GLOVE BIOGEL PI IND STRL 8 (GLOVE) ×1 IMPLANT
GLOVE BIOGEL PI INDICATOR 7.0 (GLOVE) ×2
GLOVE BIOGEL PI INDICATOR 8 (GLOVE) ×2
GOWN STRL REUS W/ TWL LRG LVL3 (GOWN DISPOSABLE) ×2 IMPLANT
GOWN STRL REUS W/ TWL XL LVL3 (GOWN DISPOSABLE) ×1 IMPLANT
GOWN STRL REUS W/TWL LRG LVL3 (GOWN DISPOSABLE) ×6
GOWN STRL REUS W/TWL XL LVL3 (GOWN DISPOSABLE) ×3
KIT ROOM TURNOVER OR (KITS) ×3 IMPLANT
MANIFOLD NEPTUNE II (INSTRUMENTS) IMPLANT
NS IRRIG 1000ML POUR BTL (IV SOLUTION) IMPLANT
PACK ARTHROSCOPY DSU (CUSTOM PROCEDURE TRAY) ×3 IMPLANT
PAD ABD 8X10 STRL (GAUZE/BANDAGES/DRESSINGS) ×2 IMPLANT
PAD ARMBOARD 7.5X6 YLW CONV (MISCELLANEOUS) ×6 IMPLANT
PADDING CAST COTTON 6X4 STRL (CAST SUPPLIES) IMPLANT
SET ARTHROSCOPY TUBING (MISCELLANEOUS) ×3
SET ARTHROSCOPY TUBING LN (MISCELLANEOUS) ×1 IMPLANT
SPONGE GAUZE 4X4 12PLY STER LF (GAUZE/BANDAGES/DRESSINGS) ×2 IMPLANT
SPONGE LAP 4X18 X RAY DECT (DISPOSABLE) ×3 IMPLANT
SUT ETHILON 2 0 FS 18 (SUTURE) IMPLANT
SUT ETHILON 4 0 FS 1 (SUTURE) ×2 IMPLANT
TOWEL OR 17X24 6PK STRL BLUE (TOWEL DISPOSABLE) ×3 IMPLANT
TOWEL OR 17X26 10 PK STRL BLUE (TOWEL DISPOSABLE) ×3 IMPLANT
TUBE CONNECTING 12'X1/4 (SUCTIONS) ×1
TUBE CONNECTING 12X1/4 (SUCTIONS) ×2 IMPLANT
WATER STERILE IRR 1000ML POUR (IV SOLUTION) ×3 IMPLANT

## 2015-07-01 NOTE — Progress Notes (Signed)
Nasal betadine completed.

## 2015-07-01 NOTE — Anesthesia Procedure Notes (Addendum)
Procedure Name: Intubation Date/Time: 07/01/2015 12:49 PM Performed by: Garrison Columbus T Pre-anesthesia Checklist: Patient identified, Emergency Drugs available, Suction available and Patient being monitored Patient Re-evaluated:Patient Re-evaluated prior to inductionOxygen Delivery Method: Circle system utilized Preoxygenation: Pre-oxygenation with 100% oxygen Intubation Type: IV induction and Rapid sequence Ventilation: Mask ventilation without difficulty Laryngoscope Size: Glidescope and 4 Grade View: Grade I Tube type: Oral Tube size: 7.5 mm Number of attempts: 1 Airway Equipment and Method: Stylet and Video-laryngoscopy Placement Confirmation: ETT inserted through vocal cords under direct vision,  positive ETCO2 and breath sounds checked- equal and bilateral Secured at: 22 cm Tube secured with: Tape Dental Injury: Teeth and Oropharynx as per pre-operative assessment  Comments: Patient complaining of neck pain and stiffness with limited range of motion. Elective glidescope intubation. Patient positioned head and neck for comfort prior to induction and then head and neck left in same position for induction and intubation.

## 2015-07-01 NOTE — H&P (Signed)
[facility] History and Physical  OSBORNE COPUS [id number] DOB: 12/21/1940 DOA: 07/01/2015  Referring physician: phiefer PCP: Leamon Arnt, MD   Chief Complaint: left knee swelling  HPI: Luis Chen is a 75 y.o. male with a past medical history that includes CAD, kidney disease, since emergency department with the chief complaint left knee swelling. Initial evaluation reveals a left knee effusion and positive joint fluid from aspirate as well as acute on chronic kidney disease  Information is obtained from the patient and the chart. He reports about 6 days ago he developed mild left-sided neck stiffness that was intermittent. In addition he developed significant left knee swelling mild swelling in his right ankle and tenderness to his left wrist. He reports the pain in his right ankle worsened to the point it was difficult for him to ambulate. He denies any erythema to any of these joints. Describes the pain as constant aching worse with movement. Right ankles also tender to touch. He denies chest pain palpitations shortness of breath dizziness syncope or near-syncope. He denies fever chills nausea vomiting diarrhea. His report intermittent headache. He is had episodes similar in the past of multiple joint swelling denies ever being diagnosed with gout. He denies any other chronic joint problems. He ambulates independently and no recent falls or traumas.  In the emergency department he's afebrile hemodynamically stable and not hypoxic. He is provided with IV fluids to mycin and Rocephin.   Review of Systems:  Review of systems completed and all systems are negative except as indicated in the history of present illness  Past Medical History  Diagnosis Date  . Hyperlipidemia     transient history, controlled on diet  . Hypertension   . Cardiomyopathy     EF was 25% improved to 50%.   Marland Kitchen CAD (coronary artery disease)     non obstructive. Left main normal. LAd proximal long  25% stenosis, termingating as focal 50% prox to mid lesion. First & second diag were small, normal. Circumflex in proximal av groove had luminal irregularities.. Was large mid obtuse marg which was branching, scattered luminal irregul. Infer branch did supply some septal perforators. Was PDA,small & normal.  < 1.54mm. There was prox 95% stenosis   History reviewed. No pertinent past surgical history. Social History:  reports that he has never smoked. He does not have any smokeless tobacco history on file. He reports that he does not drink alcohol or use illicit drugs. 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 Ace Inhibitors     REACTION: swelling in face August 2010    Family History  Problem Relation Age of Onset  . Diabetes Mother   . Heart disease Brother     had in his 52s. Had CABG x2     Prior to Admission medications   Medication Sig Start Date End Date Taking? Authorizing Provider  aspirin 81 MG tablet Take 81 mg by mouth daily.     Yes Historical Provider, MD  carvedilol (COREG) 12.5 MG tablet TAKE ONE TABLET BY MOUTH TWICE DAILY 02/17/11  Yes [name], MD  cyclobenzaprine (FLEXERIL) 10 MG tablet Take 10 mg by mouth every 8 (eight) hours as needed for muscle spasms.  06/26/15 07/06/15 Yes Historical Provider, MD  FERROUS GLUCONATE IRON PO Take 240 mg by mouth daily.   Yes Historical Provider, MD  furosemide (LASIX) 40 MG tablet  Take 40 mg by mouth daily.   Yes Historical Provider, MD  losartan (COZAAR) 25 MG tablet Take 1 tablet (25 mg total) by mouth daily. 09/08/10  Yes [name], MD  pravastatin (PRAVACHOL) 40 MG tablet Take 80 mg by mouth daily.    Yes Historical Provider, MD  traMADol (ULTRAM) 50 MG tablet Take 50 mg by mouth every 6 (six) hours as needed for severe pain.  06/26/15 07/06/15 Yes Historical Provider, MD   Physical Exam: Filed Vitals:    07/01/15 0945 07/01/15 1000 07/01/15 1045 07/01/15 1100  BP:  120/64 121/80 129/76  Pulse: 83 88 79 79  Temp:  97.7 F (36.5 C)    TempSrc:      Resp:  16  16  Height:      Weight:      SpO2: 97%  100% 100%    Wt Readings from Last 3 Encounters:  07/01/15 67.132 kg (148 lb)  11/17/11 69.31 kg (152 lb 12.8 oz)  11/06/11 72.576 kg (160 lb)    General:  Appears calm and comfortable  Eyes: PERRL, normal lids, irises & conjunctiva ENT: grossly normal hearing, lips & tongue, because membranes of his mouth are moist and pink. Very poor dentition Neck: no LAD, masses or thyromegaly Cardiovascular: RRR, no m/r/g. Pedal pulses present and palpable no lower extremity edema. Respiratory: CTA bilaterally, no w/r/r. Normal respiratory effort. Abdomen: soft, ntnd Rozella bowel sounds throughout no guarding Skin: no rash or induration seen on limited exam Musculoskeletal: Mild swelling left knee no tenderness no erythema, right ankle mild swelling and tenderness so some warmth to right calf left wrist no swelling no erythema mild tenderness for range of motion Psychiatric: grossly normal mood and affect, speech fluent and appropriate Neurologic: grossly non-focal. Alert and oriented 3 speech clear           Labs on Admission:  Basic Metabolic Panel:  Recent Labs Lab 07/01/15 0341  NA 143  K 4.4  CL 107  CO2 25  GLUCOSE 130*  BUN 30*  CREATININE 1.91*  CALCIUM 9.4   Liver Function Tests:  Recent Labs Lab 07/01/15 0341  AST 75*  ALT 49  ALKPHOS 155*  BILITOT 1.4*  PROT 7.5  ALBUMIN 2.9*   No results for input(s): LIPASE, AMYLASE in the last 168 hours. No results for input(s): AMMONIA in the last 168 hours. CBC:  Recent Labs Lab 07/01/15 0341  WBC 9.4  NEUTROABS 7.3  HGB 9.7*  HCT 29.5*  MCV 93.1  PLT 266   Cardiac Enzymes: No results for input(s): CKTOTAL, CKMB, CKMBINDEX, TROPONINI in the last 168 hours.  BNP (last 3 results) No results for input(s): BNP  in the last 8760 hours.  ProBNP (last 3 results) No results for input(s): PROBNP in the last 8760 hours.  CBG: No results for input(s): GLUCAP in the last 168 hours.  Radiological Exams on Admission: Dg Chest 2 View  07/01/2015  CLINICAL DATA:  Fever EXAM: CHEST  2 VIEW COMPARISON:  10/20/2008 FINDINGS: Chronic mild cardiomegaly and stable aortic tortuosity. Chronic mild biapical pleural thickening. There is no edema, consolidation, effusion, or pneumothorax. No acute osseous finding IMPRESSION: Stable.  No evidence of active disease. Electronically Signed   By: Monte Fantasia M.D.   On: 07/01/2015 04:16   Dg Ankle Complete Right  07/01/2015  CLINICAL DATA:  Atraumatic right ankle swelling for 2 days. EXAM: RIGHT ANKLE - COMPLETE 3+ VIEW COMPARISON:  None. FINDINGS: Periarticular soft tissue swelling best noted  posteriorly, without detectable joint effusion. No fracture, subluxation, or erosion. There is a lucency in the lateral talar dome with surrounding sclerosis. Mild tibiotalar osteoarthritic spurring. IMPRESSION: 1. Soft tissue swelling without acute osseous finding. 2. Lateral talar osteochondral lesion. Electronically Signed   By: Monte Fantasia M.D.   On: 07/01/2015 04:15   Dg Knee Complete 4 Views Left  07/01/2015  CLINICAL DATA:  Atraumatic left knee pain and swelling for 2 days EXAM: LEFT KNEE - COMPLETE 4+ VIEW COMPARISON:  None. FINDINGS: Suspected joint effusion, but limited by obliquity. There is no fracture, erosion, or subluxation. Knee osteoarthritis with advanced medial compartment narrowing. Chondrocalcinosis best seen at the lateral meniscus. IMPRESSION: 1. Probable joint effusion.  No acute osseous finding. 2. Knee osteoarthritis and chondrocalcinosis. Electronically Signed   By: Monte Fantasia M.D.   On: 07/01/2015 04:17    EKG:  Assessment/Plan Principal Problem:   Swelling of left knee joint Active Problems:   Essential hypertension, benign   Coronary  atherosclerosis   Chronic systolic heart failure (HCC)   Disorder resulting from impaired renal function   Anemia   Polyarthropathy   Left knee pain   Acute renal failure superimposed on stage 3 chronic kidney disease ([place])  1. Swelling left knee/right ankle. Etiology uncertain. X-ray with mild effusion no fracture or erosion, knee osteoarthritis likely joint effusion. Right ankle x-ray shows soft tissue swelling without osseous finding and lateral talar osteochondral lesion. Knee aspirated preliminary results +WBC. Concern for septic joint. Appendix evaluated recommending antibiotics and flushing of the joint -Admit to MedSurg -Continue Rocephin and vancomycin initiated in the emergency department -Follow RPR, sedimentation rate, C-reactive protein, HIV antibodies -add CK -Follow knee aspirite fluid culture -Follow GC chlamydia -Supportive therapy  #2. Acute renal failure superimposed on stage III chronic kidney disease. Creatinine 1.91 on admission review indicates baseline around 1.4. protienuria -We'll hold nephrotoxins -Monitor urine output -Gentle IV fluids -Recheck in the morning  #3. Anemia. Likely of chronic disease. Hemoglobin 9.7 on admission. Chart review indicates baseline within 3 years ago. -Anemia panel -Monitor  #4. Chronic systolic heart failure. Appears compensated. Last echo 2013 showed EF of 55%. Home medications include Coreg, Lasix, Cozaar. Will hold these medications for now -Daily weights -Monitor intake and output -We'll resume Coreg after procedure  #5. Hypertension. Medications as noted above. Blood pressure controlled on admission -We'll resume Coreg -Hold Lasix and Cozaar for now secondary to #2  Dr Noemi Chapel ortho  Code Status: full DVT Prophylaxis: Family Communication: son at bedside Disposition Plan: home when ready  Time spent: 79 minutes  [facility]

## 2015-07-01 NOTE — Anesthesia Preprocedure Evaluation (Addendum)
Anesthesia Evaluation  Patient identified by MRN, date of birth, ID band Patient awake    Reviewed: Allergy & Precautions, NPO status , Patient's Chart, lab work & pertinent test results, reviewed documented beta blocker date and time   Airway Mallampati: III  TM Distance: >3 FB Neck ROM: Limited  Mouth opening: Limited Mouth Opening Comment: Pain over the R neck since Wednesday.  Dental  (+) Poor Dentition, Missing, Dental Advisory Given,    Pulmonary neg pulmonary ROS,    breath sounds clear to auscultation       Cardiovascular hypertension, Pt. on medications and Pt. on home beta blockers + CAD   Rhythm:Regular Rate:Normal     Neuro/Psych negative neurological ROS  negative psych ROS   GI/Hepatic negative GI ROS, Neg liver ROS,   Endo/Other  negative endocrine ROS  Renal/GU Renal InsufficiencyRenal disease  negative genitourinary   Musculoskeletal negative musculoskeletal ROS (+)   Abdominal Normal abdominal exam  (+)   Peds negative pediatric ROS (+)  Hematology   Anesthesia Other Findings - HLD  Reproductive/Obstetrics negative OB ROS                           2013 Echo Left ventricle: Posterior wall hypokinesis. The cavity size was normal. Wall thickness was normal. Systolic function was normal. The estimated ejection fraction was in the range of 55% to 60%. Doppler parameters are consistent with abnormal left ventricular relaxation (grade 1 diastolic dysfunction). - Aortic valve: Mild regurgitation.  Anesthesia Physical Anesthesia Plan  ASA: III  Anesthesia Plan: General   Post-op Pain Management:    Induction: Intravenous  Airway Management Planned: Oral ETT and Video Laryngoscope Planned  Additional Equipment:   Intra-op Plan:   Post-operative Plan: Extubation in OR  Informed Consent: I have reviewed the patients History and Physical, chart, labs and  discussed the procedure including the risks, benefits and alternatives for the proposed anesthesia with the patient or authorized representative who has indicated his/her understanding and acceptance.   Dental advisory given  Plan Discussed with: CRNA  Anesthesia Plan Comments: (Discussed anesthetic plan with son and daughter (on phone). )      Anesthesia Quick Evaluation

## 2015-07-01 NOTE — ED Notes (Signed)
MD at bedside. 

## 2015-07-01 NOTE — ED Notes (Signed)
Patient arrives with complaint of lower extremity pain. States onset over the past week. Goes through elaborate story about how his doctor recently cleaned out his ears which caused him to have some neck pain; he then took pain medications which seems to have helped the neck pain; and the doctor instructed him to return if he had more problems.

## 2015-07-01 NOTE — ED Provider Notes (Signed)
CSN: [id number]     Arrival date & time 07/01/15  0146 History  By signing my name below, I, Irene Pap, attest that this documentation has been prepared under the direction and in the presence of Sharlett Iles, MD. Electronically Signed: Irene Pap, ED Scribe. 07/01/2015. 3:16 AM.   Chief Complaint  Patient presents with  . Leg Pain   The history is provided by the patient. No language interpreter was used.  HPI Comments: Luis Chen is a 75 y.o. Male with a hx of CAD, cardiomyopathy, and HTN who presents to the Emergency Department complaining of right ankle and left knee pain and swelling onset one week ago. Pt reports that he had his ears cleaned recently and began to experience pain after with right sided neck pain. He reports b/l ear pain. Pt reports1 episode of diarrhea this morning. Family member states that pt has been having difficulty with walking due to pain. Patient has a distant history of her mid joint swelling over the past several years and a long time ago had arthrocentesis but he states it was not gout and he is unsure of any diagnosis. He denies any recent tick bites, rash, cough, chills, cough, sore throat, fever at home, nausea, or vomiting. He reports a chronic history of anemia for which he takes iron. He denies any blood in his stool. He denies any abdominal pain. Occasional alcohol use.  Past Medical History  Diagnosis Date  . Hyperlipidemia     transient history, controlled on diet  . Hypertension   . Cardiomyopathy     EF was 25% improved to 50%.   Marland Kitchen CAD (coronary artery disease)     non obstructive. Left main normal. LAd proximal long 25% stenosis, termingating as focal 50% prox to mid lesion. First & second diag were small, normal. Circumflex in proximal av groove had luminal irregularities.. Was large mid obtuse marg which was branching, scattered luminal irregul. Infer branch did supply some septal perforators. Was PDA,small & normal.  < 1.27mm.  There was prox 95% stenosis   History reviewed. No pertinent past surgical history. Family History  Problem Relation Age of Onset  . Diabetes Mother   . Heart disease Brother     had in his 73s. Had CABG x2   Social History  Substance Use Topics  . Smoking status: Never Smoker   . Smokeless tobacco: None  . Alcohol Use: No    Review of Systems 10 Systems reviewed and all are negative for acute change except as noted in the HPI.  Allergies  Ace inhibitors  Home Medications   Prior to Admission medications   Medication Sig Start Date End Date Taking? Authorizing Provider  aspirin 81 MG tablet Take 81 mg by mouth daily.     Yes Historical Provider, MD  carvedilol (COREG) 12.5 MG tablet TAKE ONE TABLET BY MOUTH TWICE DAILY 02/17/11  Yes [name], MD  cyclobenzaprine (FLEXERIL) 10 MG tablet Take 10 mg by mouth every 8 (eight) hours as needed for muscle spasms.  06/26/15 07/06/15 Yes Historical Provider, MD  FERROUS GLUCONATE IRON PO Take 240 mg by mouth daily.   Yes Historical Provider, MD  furosemide (LASIX) 40 MG tablet Take 40 mg by mouth daily.   Yes Historical Provider, MD  losartan (COZAAR) 25 MG tablet Take 1 tablet (25 mg total) by mouth daily. 09/08/10  Yes [name], MD  pravastatin (PRAVACHOL) 40 MG tablet Take 80 mg by mouth daily.  Yes Historical Provider, MD  traMADol (ULTRAM) 50 MG tablet Take 50 mg by mouth every 6 (six) hours as needed for severe pain.  06/26/15 07/06/15 Yes Historical Provider, MD   BP 122/58 mmHg  Pulse 93  Temp(Src) 101 F (38.3 C) (Oral)  Resp 16  Ht 5\' 11"  (1.803 m)  Wt 148 lb (67.132 kg)  BMI 20.65 kg/m2  SpO2 92% Physical Exam  Constitutional: He is oriented to person, place, and time. He appears well-developed and well-nourished. No distress.  HENT:  Head: Normocephalic and atraumatic.  Right Ear: Tympanic membrane and ear canal normal.  Left Ear: Tympanic membrane and ear canal normal.  Moist mucous membranes  Eyes:  Conjunctivae are normal. Pupils are equal, round, and reactive to light.  Neck: Neck supple.  Right paracervical spinal muscle tenderness from shoulder to posterior ear.  Cardiovascular: Regular rhythm and normal heart sounds.  Tachycardia present.   No murmur heard. Pulmonary/Chest: Effort normal and breath sounds normal.  Abdominal: Soft. Bowel sounds are normal. He exhibits no distension. There is no tenderness.  Musculoskeletal:  2+ DP pulses; uniform swelling of right ankle with tenderness at the anterior ankle and pain with plantarflexion; swelling and effusion of left knee with normal ROM  Neurological: He is alert and oriented to person, place, and time.  Fluent speech; normal sensation BLE  Skin: Skin is warm and dry. No rash noted. No erythema.  Psychiatric: He has a normal mood and affect. Judgment normal.  Nursing note and vitals reviewed.   ED Course  .Joint Aspiration/Arthrocentesis Date/Time: 07/01/2015 7:43 AM Performed by: Sharlett Iles Authorized by: Sharlett Iles Consent: Verbal consent obtained. Written consent obtained. Risks and benefits: risks, benefits and alternatives were discussed Consent given by: patient Patient identity confirmed: verbally with patient Indications: joint swelling and possible septic joint  Body area: knee Joint: left knee Local anesthesia used: yes Anesthesia: local infiltration Local anesthetic: lidocaine 1% with epinephrine Anesthetic total: 1 ml Preparation: Patient was prepped and draped in the usual sterile fashion. Needle gauge: 18 G Ultrasound guidance: no Approach: lateral Aspirate: cloudy Aspirate amount: 1 mL Patient tolerance: Patient tolerated the procedure well with no immediate complications   (including critical care time) DIAGNOSTIC STUDIES: Oxygen Saturation is 94% on RA, adequate by my interpretation.    COORDINATION OF CARE: 3:11 AM-Discussed treatment plan which includes labs and x-ray with  pt at bedside and pt agreed to plan.   Labs Review Labs Reviewed  COMPREHENSIVE METABOLIC PANEL - Abnormal; Notable for the following:    Glucose, Bld 130 (*)    BUN 30 (*)    Creatinine, Ser 1.91 (*)    Albumin 2.9 (*)    AST 75 (*)    Alkaline Phosphatase 155 (*)    Total Bilirubin 1.4 (*)    GFR calc non Af Amer 32 (*)    GFR calc Af Amer 38 (*)    All other components within normal limits  CBC WITH DIFFERENTIAL/PLATELET - Abnormal; Notable for the following:    RBC 3.17 (*)    Hemoglobin 9.7 (*)    HCT 29.5 (*)    Monocytes Absolute 1.1 (*)    All other components within normal limits  URINALYSIS, ROUTINE W REFLEX MICROSCOPIC (NOT AT [facility]) - Abnormal; Notable for the following:    Hgb urine dipstick MODERATE (*)    Ketones, ur 15 (*)    Protein, ur 30 (*)    All other components within normal limits  URINE  MICROSCOPIC-ADD ON - Abnormal; Notable for the following:    Squamous Epithelial / LPF 0-5 (*)    Bacteria, UA RARE (*)    All other components within normal limits  CULTURE, BLOOD (ROUTINE X 2)  CULTURE, BLOOD (ROUTINE X 2)  URINE CULTURE  BODY FLUID CULTURE  GRAM STAIN  SYNOVIAL CELL COUNT + DIFF, W/ CRYSTALS  I-STAT CG4 LACTIC ACID, ED    Imaging Review Dg Chest 2 View  07/01/2015  CLINICAL DATA:  Fever EXAM: CHEST  2 VIEW COMPARISON:  10/20/2008 FINDINGS: Chronic mild cardiomegaly and stable aortic tortuosity. Chronic mild biapical pleural thickening. There is no edema, consolidation, effusion, or pneumothorax. No acute osseous finding IMPRESSION: Stable.  No evidence of active disease. Electronically Signed   By: Monte Fantasia M.D.   On: 07/01/2015 04:16   Dg Ankle Complete Right  07/01/2015  CLINICAL DATA:  Atraumatic right ankle swelling for 2 days. EXAM: RIGHT ANKLE - COMPLETE 3+ VIEW COMPARISON:  None. FINDINGS: Periarticular soft tissue swelling best noted posteriorly, without detectable joint effusion. No fracture, subluxation, or erosion. There is a  lucency in the lateral talar dome with surrounding sclerosis. Mild tibiotalar osteoarthritic spurring. IMPRESSION: 1. Soft tissue swelling without acute osseous finding. 2. Lateral talar osteochondral lesion. Electronically Signed   By: Monte Fantasia M.D.   On: 07/01/2015 04:15   Dg Knee Complete 4 Views Left  07/01/2015  CLINICAL DATA:  Atraumatic left knee pain and swelling for 2 days EXAM: LEFT KNEE - COMPLETE 4+ VIEW COMPARISON:  None. FINDINGS: Suspected joint effusion, but limited by obliquity. There is no fracture, erosion, or subluxation. Knee osteoarthritis with advanced medial compartment narrowing. Chondrocalcinosis best seen at the lateral meniscus. IMPRESSION: 1. Probable joint effusion.  No acute osseous finding. 2. Knee osteoarthritis and chondrocalcinosis. Electronically Signed   By: Monte Fantasia M.D.   On: 07/01/2015 04:17   I have personally reviewed and evaluated these lab results as part of my medical decision-making.   EKG Interpretation None      MDM   Final diagnoses:  None   Patient presents with several days of left knee and right ankle swelling as well as right-sided neck pain that began after he had his ears cleaned out type PCP. On arrival, he was awake and alert, in no acute distress. Vital signs notable for fever of 101, heart rate 104, BP 162/78. Patient breathing comfortably on room air. He had swelling of his left knee and right ankle with pain during range of motion but no skin changes. He was neurovascularly intact distally. He had tenderness along his right side of neck near SCM and trapezius muscles. Because of fever, obtained above lab work including blood and urine cultures. Lactate was normal. Labs notable for creatinine of 1.91, hemoglobin 9.7, normal WBC count, AST 75, ALT of 49, bilirubin 1.4. UA without signs of infection. Chest x-ray negative for acute process. The patient denies any bloody stools and reports history of anemia. Pt w/ hx of CKD, no  recent creatinine available. Gave IVF bolus. Regarding elevated AST and bili, pt does endorse some alcohol use but denies any abdominal pain.  Obtained plain films of ankle and knee. Ankle showed soft tissue swelling and osteochondral lesion, no other findings. Knee showed osteoarthritis and chondrocalcinosis, probable joint effusion. Because of fever and swelling, I discussed possibility of infection and recommended arthrocentesis. Obtained consent after discussion of risks and benefits. See procedure note for details. Fluid sent for analysis and culture.  The  patient's joint fluid analysis labs are pending and I am signing out to the oncoming provider. His disposition is pending lab results.  I personally performed the services described in this documentation, which was scribed in my presence. The recorded information has been reviewed and is accurate.   Sharlett Iles, MD 07/01/15 [phone]

## 2015-07-01 NOTE — ED Provider Notes (Signed)
Patient accepted at sign out from Dr. Rex Kras. Patient reports he got his ears clean Wednesday, 6 days ago. He identifies all of his symptoms as starting subsequent to this. He reports he got stiffness in his neck on the left side. He reports the degree of stiffness waxed and waned somewhat. It had improved but now is worse again. It is causing him to hold his head slightly to the side and he feels it is difficult to turn. In association with this he has some posterior headache. Patient also has had significant joint swelling and pain. His right ankle became swollen and very painful to walk on and his left knee became swollen and painful. He reports his left wrist was also painful but less so at this time. Patient has not had chronic joint problems. He does however report several episodes over the last number of years of isolated joint swelling that was evaluated and no etiology identified. His episodes occurred in about 2005 and then 1 about 5 years or so later. He describes having gotten a joint aspirate done and NOT being diagnosed with gout.  On examination, the patient is alert and oriented. He is nontoxic. No respiratory distress. Bilateral TMs are normal. The ear canals do not show any edema. He does have torticollis on the left. He has tenderness to palpation within the muscle bodies of the trapezius. Anterior soft tissue is normal without swelling. Patient does have gingivitis. His posterior airway is patent. No difficulty swallowing or breathing. Findings are consistent with torticollis and not meningitis.  Heart is regular with intermittent ectopy. No gross rub murmur gallop. No respiratory distress. Lungs are clear with some mild rail at the right base.  Patient's left knee has mild effusion. No significant erythema. Right knee has no effusion. Right ankle has mild effusion without erythema. Hands and feet do not show edema or effusion.  Skin is warm and dry without rash.  His case was reviewed  with Dr. Raliegh Ip of orthopedics. Based on findings from joint aspirate, Dr. Para March wishes to take the patient to the OR to do a washout of the joint. His culture has been obtained, he advises okay to administer IV antibiotics at this time. Vancomycin and Rocephin initiated.  Patient will be admitted to medical service for management. Consideration is also given to other etiologies of polyarthropathy with fever. Patient is sexually active. RPR, sedimentation rate, CRP, Lyme titer, GC chlamydia have been added. Other consideration is for possible autoimmune etiology of polyarthropathy.  Charlesetta Shanks, MD 07/01/15 1041

## 2015-07-01 NOTE — Op Note (Signed)
07/01/2015  1:25 PM  PATIENT:  Luis Chen    PRE-OPERATIVE DIAGNOSIS:  septic knee  POST-OPERATIVE DIAGNOSIS:  Same  PROCEDURE:  Irrigation and debridement left knee arthroscopy  SURGEON:  Glen Blatchley, Ernesta Amble, MD  ASSISTANT: Ma Rings  ANESTHESIA:   gen  PREOPERATIVE INDICATIONS:  Luis Chen is a  75 y.o. male with a diagnosis of septic knee who failed conservative measures and elected for surgical management.    The risks benefits and alternatives were discussed with the patient preoperatively including but not limited to the risks of infection, bleeding, nerve injury, cardiopulmonary complications, the need for revision surgery, among others, and the patient was willing to proceed.  OPERATIVE IMPLANTS: none  OPERATIVE FINDINGS: Purulent fluid  BLOOD LOSS: min  COMPLICATIONS: none  TOURNIQUET TIME: none  OPERATIVE PROCEDURE:  Patient was identified in the preoperative holding area and site was marked by me He was transported to the operating theater and placed on the table in supine position taking care to pad all bony prominences. After a preincinduction time out anesthesia was induced. The left lower extremity was prepped and draped in normal sterile fashion and a pre-incision timeout was performed. He received ancef/vanc after cultures obtained for preoperative antibiotics.   I made an inferior medial and lateral arthroscopic portals I inserted the cannula for the arthroscope and collected all the fluid that drained it was a significant amount of purulent fluid I sent this for culture he was then given vancomycin and Ancef  I then inserted the arthroscope he had known severe arthritis on the medial compartment with fraying of his meniscus there was no large meniscal tear that required debridement. The cartilage was not amenable to chondroplasty as he had mostly grade 4 chondral loss on the lateral he had some small amount of fraying of his lateral meniscus minimal  chondral pathology. The patellofemoral space there was no pathology.  I then thoroughly irrigated the knee with 6 L of saline using a shaver for debridement. I then removed all scopic equipment and closed the portals with simple stitches sterile dressings applied and he was taken the PACU in stable condition  POST OPERATIVE PLAN: Weightbearing as tolerated antibiotics per the primary team    This note was generated using a template and dragon dictation system. In light of that, I have reviewed the note and all aspects of it are applicable to this case. Any dictation errors are due to the computerized dictation system.

## 2015-07-01 NOTE — Consult Note (Signed)
ORTHOPAEDIC CONSULTATION  REQUESTING PHYSICIAN: No att. providers found  Chief Complaint: Left knee pain and swelling  HPI: Luis Chen is a 75 y.o. male who complains of 5-6 days of left knee pain and swelling. He has been febrile recently. He has never had similar symptoms. He has a little bit of soreness to the right ankle but does not note any significant swelling. He did have some sort of inner ear infection and clean out 6 days ago. He was given antibiotics at the time.  Past Medical History  Diagnosis Date  . Hyperlipidemia     transient history, controlled on diet  . Hypertension   . Cardiomyopathy     EF was 25% improved to 50%.   Marland Kitchen CAD (coronary artery disease)     non obstructive. Left main normal. LAd proximal long 25% stenosis, termingating as focal 50% prox to mid lesion. First & second diag were small, normal. Circumflex in proximal av groove had luminal irregularities.. Was large mid obtuse marg which was branching, scattered luminal irregul. Infer branch did supply some septal perforators. Was PDA,small & normal.  < 1.64mm. There was prox 95% stenosis   History reviewed. No pertinent past surgical history. Social History   Social History  . Marital Status: Widowed    Spouse Name: N/A  . Number of Children: N/A  . Years of Education: N/A   Social History Main Topics  . Smoking status: Never Smoker   . Smokeless tobacco: None  . Alcohol Use: No  . Drug Use: No  . Sexual Activity: Not Asked   Other Topics Concern  . 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    Family History  Problem Relation Age of Onset  . Diabetes Mother   . Heart disease Brother     had in his 53s. Had CABG x2   Allergies  Allergen Reactions  . Ace Inhibitors     REACTION: swelling in face August 2010   Prior to Admission medications   Medication Sig Start Date  End Date Taking? Authorizing Provider  aspirin 81 MG tablet Take 81 mg by mouth daily.     Yes Historical Provider, MD  carvedilol (COREG) 12.5 MG tablet TAKE ONE TABLET BY MOUTH TWICE DAILY 02/17/11  Yes [name], MD  cyclobenzaprine (FLEXERIL) 10 MG tablet Take 10 mg by mouth every 8 (eight) hours as needed for muscle spasms.  06/26/15 07/06/15 Yes Historical Provider, MD  FERROUS GLUCONATE IRON PO Take 240 mg by mouth daily.   Yes Historical Provider, MD  furosemide (LASIX) 40 MG tablet Take 40 mg by mouth daily.   Yes Historical Provider, MD  losartan (COZAAR) 25 MG tablet Take 1 tablet (25 mg total) by mouth daily. 09/08/10  Yes [name], MD  pravastatin (PRAVACHOL) 40 MG tablet Take 80 mg by mouth daily.    Yes Historical Provider, MD  traMADol (ULTRAM) 50 MG tablet Take 50 mg by mouth every 6 (six) hours as needed for severe pain.  06/26/15 07/06/15 Yes Historical Provider, MD   Dg Chest 2 View  07/01/2015  CLINICAL DATA:  Fever EXAM: CHEST  2 VIEW COMPARISON:  10/20/2008 FINDINGS: Chronic mild cardiomegaly and stable aortic tortuosity. Chronic mild biapical pleural thickening. There is no edema, consolidation, effusion, or pneumothorax. No acute osseous finding IMPRESSION: Stable.  No evidence of active disease.  Electronically Signed   By: Monte Fantasia M.D.   On: 07/01/2015 04:16   Dg Ankle Complete Right  07/01/2015  CLINICAL DATA:  Atraumatic right ankle swelling for 2 days. EXAM: RIGHT ANKLE - COMPLETE 3+ VIEW COMPARISON:  None. FINDINGS: Periarticular soft tissue swelling best noted posteriorly, without detectable joint effusion. No fracture, subluxation, or erosion. There is a lucency in the lateral talar dome with surrounding sclerosis. Mild tibiotalar osteoarthritic spurring. IMPRESSION: 1. Soft tissue swelling without acute osseous finding. 2. Lateral talar osteochondral lesion. Electronically Signed   By: Monte Fantasia M.D.   On: 07/01/2015 04:15   Dg Knee Complete 4 Views  Left  07/01/2015  CLINICAL DATA:  Atraumatic left knee pain and swelling for 2 days EXAM: LEFT KNEE - COMPLETE 4+ VIEW COMPARISON:  None. FINDINGS: Suspected joint effusion, but limited by obliquity. There is no fracture, erosion, or subluxation. Knee osteoarthritis with advanced medial compartment narrowing. Chondrocalcinosis best seen at the lateral meniscus. IMPRESSION: 1. Probable joint effusion.  No acute osseous finding. 2. Knee osteoarthritis and chondrocalcinosis. Electronically Signed   By: Monte Fantasia M.D.   On: 07/01/2015 04:17    Positive ROS: All other systems have been reviewed and were otherwise negative with the exception of those mentioned in the HPI and as above.  Labs cbc  Recent Labs  07/01/15 0341  WBC 9.4  HGB 9.7*  HCT 29.5*  PLT 266    Labs inflam  Recent Labs  07/01/15 1018  CRP 33.2*    Labs coag No results for input(s): INR, PTT in the last 72 hours.  Invalid input(s): PT   Recent Labs  07/01/15 0341  NA 143  K 4.4  CL 107  CO2 25  GLUCOSE 130*  BUN 30*  CREATININE 1.91*  CALCIUM 9.4    Physical Exam: Filed Vitals:   07/01/15 1045 07/01/15 1100  BP: 121/80 129/76  Pulse: 79 79  Temp:    Resp:  16   General: Alert, no acute distress Cardiovascular: No pedal edema Respiratory: No cyanosis, no use of accessory musculature GI: No organomegaly, abdomen is soft and non-tender Skin: No lesions in the area of chief complaint other than those listed below in MSK exam.  Neurologic: Sensation intact distally save for the below mentioned MSK exam Psychiatric: Patient is competent for consent with normal mood and affect Lymphatic: No axillary or cervical lymphadenopathy  MUSCULOSKELETAL:  The left lower extremity he has a large effusion at the left knee. He has pain with any range of motion he has some warmth and erythema. His compartments are soft distally he is neurovascularly intact.  The right lower extremity he has minimal  swelling around his ankle he is neurovascular intact his compartments are soft he has no tenderness at the ankle joint and painless range of motion Other extremities are atraumatic with painless ROM and NVI.  Assessment: Septic left knee  Plan: Urgent arthroscopic I&D Continue IV antibiotics per primary team and/or ID postop  Floy Sabina, MD Cell [phone]   07/01/2015 12:30 PM

## 2015-07-01 NOTE — ED Notes (Signed)
Attempted to call report

## 2015-07-01 NOTE — Transfer of Care (Signed)
Immediate Anesthesia Transfer of Care Note  Patient: Luis Chen  Procedure(s) Performed: Procedure(s): Irrigation and debridement left knee arthroscopy (Left)  Patient Location: PACU  Anesthesia Type:General  Level of Consciousness: awake, alert  and oriented  Airway & Oxygen Therapy: Patient Spontanous Breathing and Patient connected to nasal cannula oxygen  Post-op Assessment: Report given to RN, Post -op Vital signs reviewed and stable and Patient moving all extremities X 4  Post vital signs: Reviewed and stable  Last Vitals:  Filed Vitals:   07/01/15 1045 07/01/15 1100  BP: 121/80 129/76  Pulse: 79 79  Temp:    Resp:  16    Complications: No apparent anesthesia complications

## 2015-07-01 NOTE — Progress Notes (Signed)
Pharmacy Antibiotic Note  Luis Chen is a 75 y.o. male admitted on 07/01/2015 with knee joint/positive aspiration.  Pharmacy has been consulted for vanc/ceftriaxone dosing. Will go to OR for washout of joint. AF, wbc wnl. SCr 1.91 on admit, CrCl~31. CTX 1g and Vanc 1g given x 1 in ED.  Plan: CTX 1g IV q24h Vanc 1g IV x1 in ED; then 1g IV q24h Monitor clinical progress, c/s, renal function, abx plan/LOT VT@SS  as indicated   Height: 5\' 11"  (180.3 cm) Weight: 148 lb (67.132 kg) IBW/kg (Calculated) : 75.3  Temp (24hrs), Avg:99.4 F (37.4 C), Min:97.7 F (36.5 C), Max:101 F (38.3 C)   Recent Labs Lab 07/01/15 0341 07/01/15 0349  WBC 9.4  --   CREATININE 1.91*  --   LATICACIDVEN  --  1.32    Estimated Creatinine Clearance: 31.2 mL/min (by C-G formula based on Cr of 1.91).    Allergies  Allergen Reactions  . Ace Inhibitors     REACTION: swelling in face August 2010    Antimicrobials this admission: 4/24 vanc >>  4/24 CTX >>   Dose adjustments this admission: n/a  Microbiology results: 4/24 BCx:  4/24 UCx:   4/24 knee joint aspiration:     Elicia Lamp, PharmD, BCPS Clinical Pharmacist Pager [phone] 07/01/2015 11:07 AM

## 2015-07-01 NOTE — ED Notes (Signed)
EDP at bedside  

## 2015-07-02 ENCOUNTER — Encounter (HOSPITAL_COMMUNITY): Payer: Self-pay | Admitting: Orthopedic Surgery

## 2015-07-02 ENCOUNTER — Observation Stay (HOSPITAL_COMMUNITY): Payer: Medicare Other

## 2015-07-02 DIAGNOSIS — M009 Pyogenic arthritis, unspecified: Secondary | ICD-10-CM | POA: Diagnosis not present

## 2015-07-02 DIAGNOSIS — R509 Fever, unspecified: Secondary | ICD-10-CM | POA: Diagnosis not present

## 2015-07-02 DIAGNOSIS — D638 Anemia in other chronic diseases classified elsewhere: Secondary | ICD-10-CM | POA: Diagnosis present

## 2015-07-02 DIAGNOSIS — N183 Chronic kidney disease, stage 3 (moderate): Secondary | ICD-10-CM | POA: Diagnosis present

## 2015-07-02 DIAGNOSIS — M064 Inflammatory polyarthropathy: Secondary | ICD-10-CM | POA: Diagnosis present

## 2015-07-02 DIAGNOSIS — M25471 Effusion, right ankle: Secondary | ICD-10-CM | POA: Diagnosis not present

## 2015-07-02 DIAGNOSIS — I5022 Chronic systolic (congestive) heart failure: Secondary | ICD-10-CM | POA: Diagnosis present

## 2015-07-02 DIAGNOSIS — A419 Sepsis, unspecified organism: Secondary | ICD-10-CM | POA: Diagnosis not present

## 2015-07-02 DIAGNOSIS — N179 Acute kidney failure, unspecified: Secondary | ICD-10-CM | POA: Diagnosis not present

## 2015-07-02 DIAGNOSIS — M47816 Spondylosis without myelopathy or radiculopathy, lumbar region: Secondary | ICD-10-CM | POA: Diagnosis not present

## 2015-07-02 DIAGNOSIS — I2510 Atherosclerotic heart disease of native coronary artery without angina pectoris: Secondary | ICD-10-CM | POA: Diagnosis not present

## 2015-07-02 DIAGNOSIS — Z7982 Long term (current) use of aspirin: Secondary | ICD-10-CM | POA: Diagnosis not present

## 2015-07-02 DIAGNOSIS — I428 Other cardiomyopathies: Secondary | ICD-10-CM | POA: Diagnosis present

## 2015-07-02 DIAGNOSIS — D509 Iron deficiency anemia, unspecified: Secondary | ICD-10-CM

## 2015-07-02 DIAGNOSIS — I5032 Chronic diastolic (congestive) heart failure: Secondary | ICD-10-CM | POA: Diagnosis not present

## 2015-07-02 DIAGNOSIS — I6782 Cerebral ischemia: Secondary | ICD-10-CM | POA: Diagnosis not present

## 2015-07-02 DIAGNOSIS — Z888 Allergy status to other drugs, medicaments and biological substances status: Secondary | ICD-10-CM | POA: Diagnosis not present

## 2015-07-02 DIAGNOSIS — I130 Hypertensive heart and chronic kidney disease with heart failure and stage 1 through stage 4 chronic kidney disease, or unspecified chronic kidney disease: Secondary | ICD-10-CM | POA: Diagnosis present

## 2015-07-02 DIAGNOSIS — E785 Hyperlipidemia, unspecified: Secondary | ICD-10-CM | POA: Diagnosis present

## 2015-07-02 DIAGNOSIS — D649 Anemia, unspecified: Secondary | ICD-10-CM | POA: Diagnosis not present

## 2015-07-02 DIAGNOSIS — M47812 Spondylosis without myelopathy or radiculopathy, cervical region: Secondary | ICD-10-CM | POA: Diagnosis not present

## 2015-07-02 DIAGNOSIS — M159 Polyosteoarthritis, unspecified: Secondary | ICD-10-CM | POA: Diagnosis not present

## 2015-07-02 DIAGNOSIS — M436 Torticollis: Secondary | ICD-10-CM | POA: Diagnosis present

## 2015-07-02 DIAGNOSIS — E860 Dehydration: Secondary | ICD-10-CM | POA: Diagnosis present

## 2015-07-02 DIAGNOSIS — M6282 Rhabdomyolysis: Secondary | ICD-10-CM | POA: Diagnosis not present

## 2015-07-02 DIAGNOSIS — J9811 Atelectasis: Secondary | ICD-10-CM | POA: Diagnosis not present

## 2015-07-02 DIAGNOSIS — M19012 Primary osteoarthritis, left shoulder: Secondary | ICD-10-CM | POA: Diagnosis not present

## 2015-07-02 DIAGNOSIS — R0602 Shortness of breath: Secondary | ICD-10-CM | POA: Diagnosis not present

## 2015-07-02 LAB — COMPREHENSIVE METABOLIC PANEL
ALK PHOS: 147 U/L — AB (ref 38–126)
ALT: 49 U/L (ref 17–63)
ANION GAP: 11 (ref 5–15)
AST: 104 U/L — ABNORMAL HIGH (ref 15–41)
Albumin: 2.2 g/dL — ABNORMAL LOW (ref 3.5–5.0)
BUN: 22 mg/dL — ABNORMAL HIGH (ref 6–20)
CALCIUM: 8.4 mg/dL — AB (ref 8.9–10.3)
CO2: 20 mmol/L — ABNORMAL LOW (ref 22–32)
CREATININE: 1.59 mg/dL — AB (ref 0.61–1.24)
Chloride: 105 mmol/L (ref 101–111)
GFR, EST AFRICAN AMERICAN: 47 mL/min — AB (ref >60)
GFR, EST NON AFRICAN AMERICAN: 41 mL/min — AB (ref >60)
Glucose, Bld: 139 mg/dL — ABNORMAL HIGH (ref 65–99)
Potassium: 4 mmol/L (ref 3.5–5.1)
SODIUM: 136 mmol/L (ref 135–145)
Total Bilirubin: 0.8 mg/dL (ref 0.3–1.2)
Total Protein: 6.1 g/dL — ABNORMAL LOW (ref 6.5–8.1)

## 2015-07-02 LAB — CBC
HCT: 26.1 % — ABNORMAL LOW (ref 39.0–52.0)
HEMOGLOBIN: 8.8 g/dL — AB (ref 13.0–17.0)
MCH: 30.8 pg (ref 26.0–34.0)
MCHC: 33.7 g/dL (ref 30.0–36.0)
MCV: 91.3 fL (ref 78.0–100.0)
PLATELETS: 240 10*3/uL (ref 150–400)
RBC: 2.86 MIL/uL — AB (ref 4.22–5.81)
RDW: 14.1 % (ref 11.5–15.5)
WBC: 8.6 10*3/uL (ref 4.0–10.5)

## 2015-07-02 LAB — URINE CULTURE: CULTURE: NO GROWTH

## 2015-07-02 LAB — RPR: RPR: NONREACTIVE

## 2015-07-02 LAB — B. BURGDORFI ANTIBODIES: B burgdorferi Ab IgG+IgM: <0.91 {ISR} (ref 0.00–0.90)

## 2015-07-02 LAB — HIV ANTIBODY (ROUTINE TESTING W REFLEX): HIV SCREEN 4TH GENERATION: NONREACTIVE

## 2015-07-02 MED ORDER — WHITE PETROLATUM GEL
Status: AC
  Administered 2015-07-02: 14:00:00
  Filled 2015-07-02: qty 1

## 2015-07-02 MED ORDER — HYDROCODONE-ACETAMINOPHEN 5-325 MG PO TABS
2.0000 | ORAL_TABLET | ORAL | Status: DC | PRN
  Administered 2015-07-02: 2 via ORAL
  Administered 2015-07-03: 1 via ORAL
  Administered 2015-07-05: 2 via ORAL
  Filled 2015-07-02 (×5): qty 2

## 2015-07-02 NOTE — Progress Notes (Signed)
PROGRESS NOTE                                                                                                                                                                                                             Patient Demographics:    Luis Chen, is a 75 y.o. male, DOB - [birth year], JQZ:[id number]  Admit date - 07/01/2015   Admitting Physician Waldemar Dickens, MD  Outpatient Primary MD for the patient is Leamon Arnt, MD  [facility] -   [facility] Cardiologist: Dr. Percival Spanish   Chief Complaint  Patient presents with  . Leg Pain       Brief Narrative   Please refer to admission H&P for details, in brief, 75 year old male with history of systolic CHF, hypertension, hyperlipidemia, CAD, chronic kidney disease stage III presented with multiple joint pain with swelling primarily involving left knee joint. He was seen by orthopedics and had arthroscopy with irrigation and debridement of the left knee. Synovial fluid evaluation suggested of septic joint. Patient had elevated ESR and CRP with fever. WBC normal.    Subjective:   Seen and examined. Left knee pain better. Planes of pain and swelling in his left shoulder and right ankle. MAXIMUM TEMPERATURE of 101.30F   Assessment  & Plan :    Principal Problem: Septic left knee joint Status post irrigation and debridement of left knee. Follow Samuel fluid culture. Followed by orthopedics this morning, recommended weightbearing and range of motion as tolerated. Has elevated ESR and CRP. Pain control with when necessary Vicodin. Continue empiric vancomycin and cefazolin. Follow culture (synovial fluid and blood). Based on final culture results, consult ID for antibiotic course.   Active Problems:  Polyarthritis Complains ongoing symptoms for last 7-8 appears but not this severe. He now has pain in his right ankle and left shoulder with restricted range of  motion (mainly in the left shoulder for past 2 weeks). Will check x-ray of his left shoulder. Continue PT. Check rheumatoid factor. GC probe and HIV. RPR negative.    Essential hypertension, benign Resume Coreg. Holding losartan given some worsened renal function.    Coronary atherosclerosis and history of chronic systolic CHF Euvolemic. Echo from 2013 with EF of 55% and grade 1 diastolic dysfunction. Continue beta blocker, aspirin and statin. Holding Lasix and losartan given mild renal dysfunction.  Acute on  chronic kidney disease stage III Baseline creatinine around 1.5 (don't have labs over past 2 years). Daily Lasix and ARB and renal function improving. May resume from tomorrow. Avoid nephrotoxins.  Iron deficiency anemia Continue iron supplements         Code Status : Full code  Family Communication  : Son at bedside  Disposition Plan  : Pending PT evaluation. Should be stable for discharge home in the next 48 hours  Barriers For Discharge :  Ongoing fever, pending culture results, PT evaluation  Consults  :  Dr. Fredonia Highland  Procedures  : Irrigation and debridement of left knee joint  DVT Prophylaxis  :  Lovenox -   Lab Results  Component Value Date   PLT 240 07/02/2015    Antibiotics  :    Anti-infectives    Start     Dose/Rate Route Frequency Ordered Stop   07/02/15 1100  vancomycin (VANCOCIN) IVPB 1000 mg/200 mL premix     1,000 mg 200 mL/hr over 60 Minutes Intravenous Every 24 hours 07/01/15 1112     07/02/15 1030  cefTRIAXone (ROCEPHIN) 1 g in dextrose 5 % 50 mL IVPB     1 g 100 mL/hr over 30 Minutes Intravenous Every 24 hours 07/01/15 1112     07/02/15 0600  ceFAZolin (ANCEF) IVPB 2g/100 mL premix     2 g 200 mL/hr over 30 Minutes Intravenous On call to O.R. 07/01/15 1205 07/01/15 1335   07/01/15 1900  ceFAZolin (ANCEF) IVPB 2g/100 mL premix     2 g 200 mL/hr over 30 Minutes Intravenous Every 6 hours 07/01/15 1508 07/02/15 0644   07/01/15 1209   ceFAZolin (ANCEF) 2-4 GM/100ML-% IVPB    Comments:  [name], [name]   : cabinet override      07/01/15 1209 07/02/15 0014   07/01/15 1045  vancomycin (VANCOCIN) IVPB 1000 mg/200 mL premix     1,000 mg 200 mL/hr over 60 Minutes Intravenous  Once 07/01/15 1032 07/01/15 1330   07/01/15 1045  cefTRIAXone (ROCEPHIN) 1 g in dextrose 5 % 50 mL IVPB     1 g 100 mL/hr over 30 Minutes Intravenous  Once 07/01/15 1032 07/01/15 1118        Objective:   Filed Vitals:   07/01/15 2132 07/01/15 2338 07/02/15 0500 07/02/15 0504  BP:  152/60  132/58  Pulse:  85  93  Temp:  99.1 F (37.3 C)  100.9 F (38.3 C)  TempSrc:  Oral  Oral  Resp:  17  17  Height:      Weight: 73.165 kg (161 lb 4.8 oz)  73.165 kg (161 lb 4.8 oz)   SpO2:  95%  96%    Wt Readings from Last 3 Encounters:  07/02/15 73.165 kg (161 lb 4.8 oz)  11/17/11 69.31 kg (152 lb 12.8 oz)  11/06/11 72.576 kg (160 lb)     Intake/Output Summary (Last 24 hours) at 07/02/15 1145 Last data filed at 07/01/15 1900  Gross per 24 hour  Intake    670 ml  Output    370 ml  Net    300 ml     Physical Exam  Gen: Elderly male not in distress HEENT: Pallor present, moist mucosa, supple neck Chest: clear b/l, no added sounds CVS: N S1&S2, no murmurs, rubs or gallop GI: soft, NT, ND, BS+ Musculoskeletal: warm, Ace wrap over left knee with some warmth in the right tibia, tenderness with limited range of motion of the left shoulder,  minimal tenderness in the right ankle CNS: Alert and oriented, nonfocal    Data Review:    CBC  Recent Labs Lab 07/01/15 0341 07/02/15 0409  WBC 9.4 8.6  HGB 9.7* 8.8*  HCT 29.5* 26.1*  PLT 266 240  MCV 93.1 91.3  MCH 30.6 30.8  MCHC 32.9 33.7  RDW 14.0 14.1  LYMPHSABS 1.0  --   MONOABS 1.1*  --   EOSABS 0.0  --   BASOSABS 0.0  --     Chemistries   Recent Labs Lab 07/01/15 0341 07/02/15 0409  NA 143 136  K 4.4 4.0  CL 107 105  CO2 25 20*  GLUCOSE 130* 139*  BUN 30* 22*  CREATININE  1.91* 1.59*  CALCIUM 9.4 8.4*  AST 75* 104*  ALT 49 49  ALKPHOS 155* 147*  BILITOT 1.4* 0.8   ------------------------------------------------------------------------------------------------------------------ No results for input(s): CHOL, HDL, LDLCALC, TRIG, CHOLHDL, LDLDIRECT in the last 72 hours.  Lab Results  Component Value Date   HGBA1C  10/19/2008    6.1 (NOTE) The ADA recommends the following therapeutic goal for glycemic control related to Hgb A1c measurement: Goal of therapy: <6.5 Hgb A1c  Reference: American Diabetes Association: Clinical Practice Recommendations 2010, Diabetes Care, 2010, 33: (Suppl  1).   ------------------------------------------------------------------------------------------------------------------ No results for input(s): TSH, T4TOTAL, T3FREE, THYROIDAB in the last 72 hours.  Invalid input(s): FREET3 ------------------------------------------------------------------------------------------------------------------ No results for input(s): VITAMINB12, FOLATE, FERRITIN, TIBC, IRON, RETICCTPCT in the last 72 hours.  Coagulation profile No results for input(s): INR, PROTIME in the last 168 hours.  No results for input(s): DDIMER in the last 72 hours.  Cardiac Enzymes No results for input(s): CKMB, TROPONINI, MYOGLOBIN in the last 168 hours.  Invalid input(s): CK ------------------------------------------------------------------------------------------------------------------ No results found for: BNP  Inpatient Medications  Scheduled Meds: . cefTRIAXone (ROCEPHIN)  IV  1 g Intravenous Q24H  . vancomycin  1,000 mg Intravenous Q24H   Continuous Infusions: . sodium chloride 50 mL/hr at 07/01/15 1900   PRN Meds:.acetaminophen **OR** acetaminophen, morphine injection, traZODone  Micro Results Recent Results (from the past 240 hour(s))  Body fluid culture     Status: None (Preliminary result)   Collection Time: 07/01/15  6:11 AM  Result Value  Ref Range Status   Specimen Description FLUID SYNOVIAL LEFT KNEE  Final   Special Requests NONE  Final   Gram Stain   Final    ABUNDANT WBC PRESENT, PREDOMINANTLY PMN NO ORGANISMS SEEN    Culture NO GROWTH 1 DAY  Final   Report Status PENDING  Incomplete  Surgical pcr screen     Status: None   Collection Time: 07/01/15 12:18 PM  Result Value Ref Range Status   MRSA, PCR NEGATIVE NEGATIVE Final   Staphylococcus aureus NEGATIVE NEGATIVE Final    Comment:        The Xpert SA Assay (FDA approved for NASAL specimens in patients over 41 years of age), is one component of a comprehensive surveillance program.  Test performance has been validated by [facility] for patients greater than or equal to 49 year old. It is not intended to diagnose infection nor to guide or monitor treatment.   Gram stain     Status: None   Collection Time: 07/01/15  1:15 PM  Result Value Ref Range Status   Specimen Description SYNOVIAL LEFT KNEE  Final   Special Requests NONE  Final   Gram Stain   Final    ABUNDANT WBC PRESENT, PREDOMINANTLY PMN NO ORGANISMS SEEN  Report Status 07/01/2015 FINAL  Final    Radiology Reports Dg Chest 2 View  07/01/2015  CLINICAL DATA:  Fever EXAM: CHEST  2 VIEW COMPARISON:  10/20/2008 FINDINGS: Chronic mild cardiomegaly and stable aortic tortuosity. Chronic mild biapical pleural thickening. There is no edema, consolidation, effusion, or pneumothorax. No acute osseous finding IMPRESSION: Stable.  No evidence of active disease. Electronically Signed   By: Monte Fantasia M.D.   On: 07/01/2015 04:16   Dg Ankle Complete Right  07/01/2015  CLINICAL DATA:  Atraumatic right ankle swelling for 2 days. EXAM: RIGHT ANKLE - COMPLETE 3+ VIEW COMPARISON:  None. FINDINGS: Periarticular soft tissue swelling best noted posteriorly, without detectable joint effusion. No fracture, subluxation, or erosion. There is a lucency in the lateral talar dome with surrounding sclerosis. Mild  tibiotalar osteoarthritic spurring. IMPRESSION: 1. Soft tissue swelling without acute osseous finding. 2. Lateral talar osteochondral lesion. Electronically Signed   By: Monte Fantasia M.D.   On: 07/01/2015 04:15   Dg Knee Complete 4 Views Left  07/01/2015  CLINICAL DATA:  Atraumatic left knee pain and swelling for 2 days EXAM: LEFT KNEE - COMPLETE 4+ VIEW COMPARISON:  None. FINDINGS: Suspected joint effusion, but limited by obliquity. There is no fracture, erosion, or subluxation. Knee osteoarthritis with advanced medial compartment narrowing. Chondrocalcinosis best seen at the lateral meniscus. IMPRESSION: 1. Probable joint effusion.  No acute osseous finding. 2. Knee osteoarthritis and chondrocalcinosis. Electronically Signed   By: Monte Fantasia M.D.   On: 07/01/2015 04:17    Time Spent in minutes  25   Louellen Molder M.D on 07/02/2015 at 11:45 AM  Between 7am to 7pm - Pager - [phone]  After 7pm go to [email or web address] - password [facility]  [facility] -  Office  [phone]

## 2015-07-02 NOTE — Care Management Obs Status (Signed)
MEDICARE OBSERVATION STATUS NOTIFICATION   Patient Details  Name: Luis Chen MRN: [id number] Date of Birth: 31-Dec-1940   Medicare Observation Status Notification Given:  Yes    Nila Nephew, RN 07/02/2015, 12:20 PM

## 2015-07-02 NOTE — Clinical Social Work Note (Signed)
CSW received referral for SNF.  Case discussed with case manager and plan is to discharge home.  CSW to sign off please re-consult if social work needs arise.  [name] R. Dominyck Reser, MSW, LCSWA [phone]  

## 2015-07-02 NOTE — Care Management Note (Signed)
Case Management Note  Patient Details  Name: Luis Chen MRN: [id number] Date of Birth: Mar 10, 1940  Subjective/Objective:              S/p left knee I and D      Action/Plan:[id number] Spoke with patient and his son Luis Chen about discharge plan. Patient will be staying with his daughter Luis Chen at [id number] [place], [place] tel # [phone]. They selected [facility]. Contacted Larissa Pegg at [place] and set up [facility]. Contacted [facility], rolling walker and 3N1 delivered to patient's room.  Expected Discharge Date:                  Expected Discharge Plan:  [place]  In-House Referral:     Discharge planning Services  CM Consult  Post Acute Care Choice:  Durable Medical Equipment, Home Health Choice offered to:  Patient  DME Arranged:  3-N-1, Walker rolling DME Agency:  [place]:  PT [place]:  [place]  Status of Service:  Completed, signed off  Medicare Important Message Given:    Date Medicare IM Given:    Medicare IM give by:    Date Additional Medicare IM Given:    Additional Medicare Important Message give by:     If discussed at [place] of Stay Meetings, dates discussed:    Additional Comments:  Nila Nephew, RN 07/02/2015, 12:31 PM

## 2015-07-02 NOTE — Progress Notes (Signed)
His pain is well-controlled. He reports his ankles and shoulder feel better today.  The left lower extremity is neurovascularly intact dressing is benign painless range of motion of the knee.  Plan:  Weightbearing as tolerated range of motion as tolerated left knee.  Dispo when antibiotic plan is arranged and home needs addressed.  Follow-up with me in 1-2 weeks  Nason Conradt D

## 2015-07-02 NOTE — Anesthesia Postprocedure Evaluation (Signed)
Anesthesia Post Note  Patient: Luis Chen  Procedure(s) Performed: Procedure(s) (LRB): Irrigation and debridement left knee arthroscopy (Left)  Patient location during evaluation: PACU Anesthesia Type: General Level of consciousness: awake and alert Pain management: pain level controlled Vital Signs Assessment: post-procedure vital signs reviewed and stable Respiratory status: spontaneous breathing, nonlabored ventilation, respiratory function stable and patient connected to nasal cannula oxygen Cardiovascular status: blood pressure returned to baseline and stable Postop Assessment: no signs of nausea or vomiting Anesthetic complications: no    Last Vitals:  Filed Vitals:   07/02/15 0504 07/02/15 1300  BP: 132/58 134/61  Pulse: 93 105  Temp: 38.3 C 37.8 C  Resp: 17 16    Last Pain:  Filed Vitals:   07/02/15 1351  PainSc: Asleep                 Effie Berkshire

## 2015-07-02 NOTE — Evaluation (Signed)
Physical Therapy Evaluation Patient Details Name: Luis Chen MRN: [id number] DOB: 09-26-1940 Today's Date: 07/02/2015   History of Present Illness  pt s/p irrigation and debridement left knee arthroscopy 07/01/15, with PMHx of CAD, cardiomyopathy, HTN, and kidney disease   Clinical Impression  Pt pleasant and willing to work with therapy. Pt required assistance for bed mobility and transfers, but was able to ambulate with min guard. Daughter present throughout session. Pt has decreased strength, balance, and ROM, and would benefit from therapy to maximize mobility and independence, as well as decrease caregiver burden. Discussed follow up therapy recommendations, and recommended HHPT because daughter is willing to let pt stay with her. Encouraged mobility with nursing staff and HEP with patient.    Follow Up Recommendations Home health PT;Supervision for mobility/OOB    Equipment Recommendations  Rolling walker with 5" wheels;3in1 (PT)    Recommendations for Other Services OT consult     Precautions / Restrictions Precautions Precautions: Fall Restrictions Weight Bearing Restrictions: Yes LLE Weight Bearing: Weight bearing as tolerated      Mobility  Bed Mobility Overal bed mobility: Needs Assistance Bed Mobility: Rolling;Sidelying to Sit Rolling: Mod assist Sidelying to sit: Mod assist       General bed mobility comments: educated patient for rolling because pt unable to use left side due to pain. assist for moving LLE across bed, rolling, and raising trunk.  Transfers Overall transfer level: Needs assistance Equipment used: None Transfers: Sit to/from Stand Sit to Stand: Min assist         General transfer comment: cues for hand placement and straightening trunk upon standing. min assist for rise and maintaining balance. slow to rise.  Ambulation/Gait Ambulation/Gait assistance: Min guard Ambulation Distance (Feet): 5 Feet Assistive device: Rolling walker (2  wheeled) Gait Pattern/deviations: Step-to pattern;Trunk flexed;Decreased stride length   Gait velocity interpretation: Below normal speed for age/gender General Gait Details: cues for posture. guard for safety. cues to avoid crossing legs when turning.  Stairs            Wheelchair Mobility    Modified Rankin (Stroke Patients Only)       Balance Overall balance assessment: Needs assistance   Sitting balance-Leahy Scale: Fair Sitting balance - Comments: pt progressed from mod assist to min guard.   Standing balance support: Bilateral upper extremity supported Standing balance-Leahy Scale: Poor                               Pertinent Vitals/Pain Pain Assessment: 0-10 Pain Score: 8  Pain Location: left shoulder and knee, as well as right ankle Pain Descriptors / Indicators: Aching Pain Intervention(s): Limited activity within patient's tolerance;RN gave pain meds during session;Repositioned    Home Living Family/patient expects to be discharged to:: Private residence Living Arrangements: Alone Available Help at Discharge: Available PRN/intermittently;Family Type of Home: House Home Access: Stairs to enter Entrance Stairs-Rails: None Technical brewer of Steps: 2 Home Layout: Multi-level Home Equipment: None Additional Comments: Dgtr states pt could go home with her in [place] to have nearly 24 hr assist    Prior Function Level of Independence: Independent               Hand Dominance        Extremity/Trunk Assessment   Upper Extremity Assessment: LUE deficits/detail       LUE Deficits / Details: decreased strength and ROM due to pain   Lower Extremity Assessment: LLE deficits/detail  LLE Deficits / Details: decreased strength and ROM due to pain  Cervical / Trunk Assessment: Kyphotic  Communication   Communication: No difficulties  Cognition Arousal/Alertness: Awake/alert Behavior During Therapy: WFL for tasks  assessed/performed Overall Cognitive Status: Within Functional Limits for tasks assessed                      General Comments      Exercises        Assessment/Plan    PT Assessment Patient needs continued PT services  PT Diagnosis Difficulty walking;Acute pain   PT Problem List Decreased strength;Decreased range of motion;Decreased activity tolerance;Decreased balance;Decreased mobility;Decreased knowledge of use of DME  PT Treatment Interventions DME instruction;Gait training;Stair training;Functional mobility training;Therapeutic activities;Therapeutic exercise;Balance training;Patient/family education   PT Goals (Current goals can be found in the Care Plan section) Acute Rehab PT Goals Patient Stated Goal: return to bowling PT Goal Formulation: With patient/family Time For Goal Achievement: 07/16/15 Potential to Achieve Goals: Fair    Frequency Min 3X/week   Barriers to discharge Decreased caregiver support lives alone, but daughter may be able to let him stay with her    Co-evaluation               End of Session Equipment Utilized During Treatment: Gait belt Activity Tolerance: Patient tolerated treatment well Patient left: in chair;with family/visitor present;with call bell/phone within reach      Functional Assessment Tool Used: clinical judgement Functional Limitation: Mobility: Walking and moving around Mobility: Walking and Moving Around Current Status [phone]): At least 40 percent but less than 60 percent impaired, limited or restricted Mobility: Walking and Moving Around Goal Status [phone]): At least 1 percent but less than 20 percent impaired, limited or restricted    Time: 0910-0949 PT Time Calculation (min) (ACUTE ONLY): 39 min   Charges:   PT Evaluation $PT Eval Moderate Complexity: 1 Procedure PT Treatments $Therapeutic Activity: 8-22 mins   PT G Codes:   PT G-Codes **NOT FOR INPATIENT CLASS** Functional Assessment Tool Used:  clinical judgement Functional Limitation: Mobility: Walking and moving around Mobility: Walking and Moving Around Current Status [phone]): At least 40 percent but less than 60 percent impaired, limited or restricted Mobility: Walking and Moving Around Goal Status [phone]): At least 1 percent but less than 20 percent impaired, limited or restricted    Haze Justin 07/02/2015, 11:16 AM   Haze Justin, SPT [phone]

## 2015-07-03 ENCOUNTER — Inpatient Hospital Stay (HOSPITAL_COMMUNITY): Payer: Medicare Other

## 2015-07-03 DIAGNOSIS — I428 Other cardiomyopathies: Secondary | ICD-10-CM | POA: Insufficient documentation

## 2015-07-03 DIAGNOSIS — I2583 Coronary atherosclerosis due to lipid rich plaque: Secondary | ICD-10-CM

## 2015-07-03 DIAGNOSIS — N179 Acute kidney failure, unspecified: Secondary | ICD-10-CM

## 2015-07-03 DIAGNOSIS — D649 Anemia, unspecified: Secondary | ICD-10-CM

## 2015-07-03 DIAGNOSIS — M159 Polyosteoarthritis, unspecified: Secondary | ICD-10-CM

## 2015-07-03 DIAGNOSIS — I10 Essential (primary) hypertension: Secondary | ICD-10-CM

## 2015-07-03 DIAGNOSIS — M009 Pyogenic arthritis, unspecified: Principal | ICD-10-CM

## 2015-07-03 DIAGNOSIS — N183 Chronic kidney disease, stage 3 (moderate): Secondary | ICD-10-CM

## 2015-07-03 DIAGNOSIS — I2510 Atherosclerotic heart disease of native coronary artery without angina pectoris: Secondary | ICD-10-CM

## 2015-07-03 DIAGNOSIS — I5032 Chronic diastolic (congestive) heart failure: Secondary | ICD-10-CM

## 2015-07-03 DIAGNOSIS — M6282 Rhabdomyolysis: Secondary | ICD-10-CM

## 2015-07-03 DIAGNOSIS — J9811 Atelectasis: Secondary | ICD-10-CM | POA: Diagnosis present

## 2015-07-03 HISTORY — DX: Rhabdomyolysis: M62.82

## 2015-07-03 LAB — CBC WITH DIFFERENTIAL/PLATELET
BASOS ABS: 0 10*3/uL (ref 0.0–0.1)
Basophils Relative: 0 %
Eosinophils Absolute: 0.1 10*3/uL (ref 0.0–0.7)
Eosinophils Relative: 1 %
HEMATOCRIT: 24.9 % — AB (ref 39.0–52.0)
Hemoglobin: 8.3 g/dL — ABNORMAL LOW (ref 13.0–17.0)
LYMPHS ABS: 1.5 10*3/uL (ref 0.7–4.0)
LYMPHS PCT: 18 %
MCH: 30.3 pg (ref 26.0–34.0)
MCHC: 33.3 g/dL (ref 30.0–36.0)
MCV: 90.9 fL (ref 78.0–100.0)
MONO ABS: 0.6 10*3/uL (ref 0.1–1.0)
Monocytes Relative: 8 %
NEUTROS ABS: 5.9 10*3/uL (ref 1.7–7.7)
Neutrophils Relative %: 73 %
Platelets: 240 10*3/uL (ref 150–400)
RBC: 2.74 MIL/uL — AB (ref 4.22–5.81)
RDW: 14.1 % (ref 11.5–15.5)
WBC: 8.1 10*3/uL (ref 4.0–10.5)

## 2015-07-03 LAB — BASIC METABOLIC PANEL
ANION GAP: 9 (ref 5–15)
BUN: 20 mg/dL (ref 6–20)
CO2: 22 mmol/L (ref 22–32)
Calcium: 8.7 mg/dL — ABNORMAL LOW (ref 8.9–10.3)
Chloride: 108 mmol/L (ref 101–111)
Creatinine, Ser: 1.64 mg/dL — ABNORMAL HIGH (ref 0.61–1.24)
GFR calc Af Amer: 45 mL/min — ABNORMAL LOW (ref >60)
GFR calc non Af Amer: 39 mL/min — ABNORMAL LOW (ref >60)
GLUCOSE: 177 mg/dL — AB (ref 65–99)
POTASSIUM: 4.2 mmol/L (ref 3.5–5.1)
Sodium: 139 mmol/L (ref 135–145)

## 2015-07-03 LAB — CK: Total CK: 1764 U/L — ABNORMAL HIGH (ref 49–397)

## 2015-07-03 LAB — HIV ANTIBODY (ROUTINE TESTING W REFLEX): HIV SCREEN 4TH GENERATION: NONREACTIVE

## 2015-07-03 LAB — RHEUMATOID FACTOR: Rhuematoid fact SerPl-aCnc: 17.5 IU/mL — ABNORMAL HIGH (ref 0.0–13.9)

## 2015-07-03 MED ORDER — ASPIRIN EC 81 MG PO TBEC
81.0000 mg | DELAYED_RELEASE_TABLET | Freq: Every day | ORAL | Status: DC
  Administered 2015-07-03 – 2015-07-06 (×4): 81 mg via ORAL
  Filled 2015-07-03 (×4): qty 1

## 2015-07-03 MED ORDER — SODIUM CHLORIDE 0.9 % IV SOLN
INTRAVENOUS | Status: DC
  Administered 2015-07-03: 125 mL/h via INTRAVENOUS
  Administered 2015-07-03: 20:00:00 via INTRAVENOUS
  Administered 2015-07-04: 125 mL/h via INTRAVENOUS
  Administered 2015-07-04 – 2015-07-06 (×3): via INTRAVENOUS

## 2015-07-03 MED ORDER — CYCLOBENZAPRINE HCL 10 MG PO TABS
10.0000 mg | ORAL_TABLET | Freq: Three times a day (TID) | ORAL | Status: DC | PRN
  Administered 2015-07-04 (×3): 10 mg via ORAL
  Filled 2015-07-03 (×5): qty 1

## 2015-07-03 MED ORDER — CARVEDILOL 3.125 MG PO TABS
3.1250 mg | ORAL_TABLET | Freq: Two times a day (BID) | ORAL | Status: DC
  Administered 2015-07-03 – 2015-07-06 (×7): 3.125 mg via ORAL
  Filled 2015-07-03 (×7): qty 1

## 2015-07-03 NOTE — Clinical Documentation Improvement (Signed)
Orthopedic  Please further clarify procedure documentation in the medical record of "debridement" and document findings in addended Op Note, next progress note. Thank you!   Type - excisional, non-excisional  Depth of - skin, subcutaneous tissue/fascia, muscle, joint, bone, etc,  Specify the nature of tissue removed, ex. slough, necrotic, devitalized tissue, nonviable tissue, etc.  Other condition  Clinically Undetermined  Supporting Information: :   "I then thoroughly irrigated the knee with 6 L of saline using a shaver for debridement"  Please exercise your independent, professional judgment when responding. A specific answer is not anticipated or expected.  Thank You, Zoila Shutter RN, BSN, [place] [phone]; Cell: [phone]

## 2015-07-03 NOTE — Progress Notes (Signed)
OT Cancellation Note  Patient Details Name: Luis Chen MRN: [id number] DOB: [birth year]   Cancelled Treatment:    Reason Eval/Treat Not Completed: Other (comment) EAting dinner. Request to come back in am. [facility], OTR/L  [phone] 07/03/2015 07/03/2015, 5:01 PM

## 2015-07-03 NOTE — Clinical Documentation Improvement (Signed)
Internal Medicine  Can the diagnosis of cardiomyopathy be further specified? Please document findings in next progress note. Thank you!   Type:  Congestive, Constrictive, Dilated, Hypertensive, Idiopathic, Ischemic, Non Ischemic, Obstructive Hypertrophic/IHSS  Other  Clinically Undetermined  Document any associated diagnoses/conditions.  Supporting Information:  EF 25% to 50%; patient with Hypertensive Heart and CKD3 with Chronic Systolic CHF  Please exercise your independent, professional judgment when responding. A specific answer is not anticipated or expected.  Thank You,  Zoila Shutter RN, BSN, [place] [phone]; Cell: [phone]

## 2015-07-03 NOTE — Progress Notes (Addendum)
[facility] Progress Note  Patient: Luis Chen IEP:[id number]   PCP: Leamon Arnt, MD DOB: 07/04/1940   DOA: 07/01/2015   DOS: 07/03/2015   Date of Service: the patient was seen and examined on 07/03/2015 Outpatient Specialists: Cardiology Dr. Percival Spanish in the past  Subjective: Patient did have fever overnight as well as this morning. Denies having any chills. No nausea no vomiting. No diarrhea no burning urination. Pain has been well controlled. Nutrition: Tolerating oral diet  Brief hospital course: Patient was admitted on 07/01/2015, with complaint of left knee pain, was found to have left knee septic arthritis. Currently on IV antibiotics. Patient underwent incision and debridement by orthopedic on 07/01/2015 Currently further plan is follow the culture of the synovial fluid and discuss with ID prior to discharge home regarding duration as well as type of antibiotic.  Assessment and Plan: 1. Pyogenic arthritis of left knee joint (HCC) Status post irrigation and debridement of the left knee arthroscopy, no excision. Patient has been started on vancomycin and ceftriaxone since admission. MRSA PCR is negative. Since the wound can still have MRSA I would continue vancomycin. Patient did have some fever on 07/03/2015, if he continues to have fever then I will broaden the coverage with [place]. HIV negative, RPR negative ESR 125, CRP 33.2. Borrelia negative. Continue to monitor clinically. Culture from the synovial and is negative for 2 days.  2. Bilateral atelectasis. Likely the cause of patient's fever. Recommended to use incentive spirometry. Next and continue vancomycin and ceftriaxone. Next and monitor cultures. The patient continues to spike fever broaden the coverage with [place].  3. Coronary artery disease. Prior history of nonischemic cardio myopathy. Recent echocardiogram in 2013 shows normal ejection fraction with grade 1 diastolic dysfunction. Continue beta  blocker, aspirin. Hold statin in the setting of elevated CPK.  4. Acute on chronic kidney disease stage III. Renal function mildly worsened on admission likely due to dehydration. Improving rapidly. CK has been elevated on admission and has further worsened this morning. We will continue with aggressive IV hydration.  5. Anemia of chronic disease. Iron panel tomorrow.  6. Nontraumatic rhabdomyolysis. Mild elevation of the CPK.  continue aggressive IV hydration.  Activity: physical therapy recommends home health PT Bowel regimen: last BM 06/30/2015 Diet: Cardiac diet DVT Prophylaxis: subcutaneous Heparin  Advance goals of care discussion: Full code  Family Communication: family was present at bedside, at the time of interview. The pt provided permission to discuss medical plan with the family. Opportunity was given to ask question and all questions were answered satisfactorily.   Disposition:  Expected discharge date: 07/05/2015 Barriers to safe discharge: Final disposition regarding antibiotics  Consultants: Orthopedics Procedures: left knee arthroscopy, irrigation and debridement without excision  Antibiotics: Anti-infectives    Start     Dose/Rate Route Frequency Ordered Stop   07/02/15 1100  vancomycin (VANCOCIN) IVPB 1000 mg/200 mL premix     1,000 mg 200 mL/hr over 60 Minutes Intravenous Every 24 hours 07/01/15 1112     07/02/15 1030  cefTRIAXone (ROCEPHIN) 1 g in dextrose 5 % 50 mL IVPB     1 g 100 mL/hr over 30 Minutes Intravenous Every 24 hours 07/01/15 1112     07/02/15 0600  ceFAZolin (ANCEF) IVPB 2g/100 mL premix     2 g 200 mL/hr over 30 Minutes Intravenous On call to O.R. 07/01/15 1205 07/01/15 1335   07/01/15 1900  ceFAZolin (ANCEF) IVPB 2g/100 mL premix     2 g 200 mL/hr over 30  Minutes Intravenous Every 6 hours 07/01/15 1508 07/02/15 0644   07/01/15 1209  ceFAZolin (ANCEF) 2-4 GM/100ML-% IVPB    Comments:  [name], [name]   : cabinet override       07/01/15 1209 07/02/15 0014   07/01/15 1045  vancomycin (VANCOCIN) IVPB 1000 mg/200 mL premix     1,000 mg 200 mL/hr over 60 Minutes Intravenous  Once 07/01/15 1032 07/01/15 1330   07/01/15 1045  cefTRIAXone (ROCEPHIN) 1 g in dextrose 5 % 50 mL IVPB     1 g 100 mL/hr over 30 Minutes Intravenous  Once 07/01/15 1032 07/01/15 1118        Intake/Output Summary (Last 24 hours) at 07/03/15 1614 Last data filed at 07/03/15 1249  Gross per 24 hour  Intake    740 ml  Output    600 ml  Net    140 ml   Filed Weights   07/01/15 2132 07/02/15 0500 07/03/15 0500  Weight: 73.165 kg (161 lb 4.8 oz) 73.165 kg (161 lb 4.8 oz) 73.891 kg (162 lb 14.4 oz)    Objective: Physical Exam: Filed Vitals:   07/03/15 0500 07/03/15 0554 07/03/15 1102 07/03/15 1248  BP:  149/65 123/64 117/64  Pulse:  113 84 88  Temp:  102.9 F (39.4 C)  98.1 F (36.7 C)  TempSrc:  Oral  Oral  Resp:  18  18  Height:      Weight: 73.891 kg (162 lb 14.4 oz)     SpO2:  95%  100%    General: Appear in mild distress, no Rash; Oral Mucosa moist. Cardiovascular: S1 and S2 Present, no Murmur, no JVD Respiratory: Bilateral Air entry present and Clear to Auscultation, no Crackles, no wheezes Abdomen: Bowel Sound present, Soft and no tenderness Extremities: trace Pedal edema, no calf tenderness Neurology: Grossly no focal neuro deficit.  Data Reviewed: CBC:  Recent Labs Lab 07/01/15 0341 07/02/15 0409 07/03/15 0943  WBC 9.4 8.6 8.1  NEUTROABS 7.3  --  5.9  HGB 9.7* 8.8* 8.3*  HCT 29.5* 26.1* 24.9*  MCV 93.1 91.3 90.9  PLT 266 240 701   Basic Metabolic Panel:  Recent Labs Lab 07/01/15 0341 07/02/15 0409 07/03/15 0943  NA 143 136 139  K 4.4 4.0 4.2  CL 107 105 108  CO2 25 20* 22  GLUCOSE 130* 139* 177*  BUN 30* 22* 20  CREATININE 1.91* 1.59* 1.64*  CALCIUM 9.4 8.4* 8.7*   GFR: Estimated Creatinine Clearance: 40.1 mL/min (by C-G formula based on Cr of 1.64). Liver Function Tests:  Recent Labs Lab  07/01/15 0341 07/02/15 0409  AST 75* 104*  ALT 49 49  ALKPHOS 155* 147*  BILITOT 1.4* 0.8  PROT 7.5 6.1*  ALBUMIN 2.9* 2.2*   No results for input(s): LIPASE, AMYLASE in the last 168 hours. No results for input(s): AMMONIA in the last 168 hours. Coagulation Profile: No results for input(s): INR, PROTIME in the last 168 hours. Cardiac Enzymes:  Recent Labs Lab 07/01/15 0341 07/03/15 0943  CKTOTAL 737* 1764*   BNP (last 3 results) No results for input(s): PROBNP in the last 8760 hours. HbA1C: No results for input(s): HGBA1C in the last 72 hours. CBG: No results for input(s): GLUCAP in the last 168 hours. Lipid Profile: No results for input(s): CHOL, HDL, LDLCALC, TRIG, CHOLHDL, LDLDIRECT in the last 72 hours. Thyroid Function Tests: No results for input(s): TSH, T4TOTAL, FREET4, T3FREE, THYROIDAB in the last 72 hours. Anemia Panel: No results for input(s): VITAMINB12, FOLATE,  FERRITIN, TIBC, IRON, RETICCTPCT in the last 72 hours. Urine analysis:    Component Value Date/Time   COLORURINE YELLOW 07/01/2015 0416   APPEARANCEUR CLEAR 07/01/2015 0416   LABSPEC 1.018 07/01/2015 0416   PHURINE 5.5 07/01/2015 0416   GLUCOSEU NEGATIVE 07/01/2015 0416   HGBUR MODERATE* 07/01/2015 0416   BILIRUBINUR NEGATIVE 07/01/2015 0416   KETONESUR 15* 07/01/2015 0416   PROTEINUR 30* 07/01/2015 0416   UROBILINOGEN 0.2 10/19/2008 0657   NITRITE NEGATIVE 07/01/2015 0416   LEUKOCYTESUR NEGATIVE 07/01/2015 0416   Sepsis Labs: '@LABRCNTIP' (procalcitonin:4,lacticidven:4)  ) Recent Results (from the past 240 hour(s))  Blood Culture (routine x 2)     Status: None (Preliminary result)   Collection Time: 07/01/15  3:35 AM  Result Value Ref Range Status   Specimen Description BLOOD RIGHT ARM  Final   Special Requests   Final    BOTTLES DRAWN AEROBIC AND ANAEROBIC 10CC BLUE 5CC RED   Culture NO GROWTH 1 DAY  Final   Report Status PENDING  Incomplete  Blood Culture (routine x 2)     Status:  None (Preliminary result)   Collection Time: 07/01/15  3:40 AM  Result Value Ref Range Status   Specimen Description BLOOD LEFT ARM  Final   Special Requests IN PEDIATRIC BOTTLE 3CC  Final   Culture NO GROWTH 1 DAY  Final   Report Status PENDING  Incomplete  Urine culture     Status: None   Collection Time: 07/01/15  4:16 AM  Result Value Ref Range Status   Specimen Description URINE, RANDOM  Final   Special Requests NONE  Final   Culture NO GROWTH 1 DAY  Final   Report Status 07/02/2015 FINAL  Final  Body fluid culture     Status: None (Preliminary result)   Collection Time: 07/01/15  6:11 AM  Result Value Ref Range Status   Specimen Description FLUID SYNOVIAL LEFT KNEE  Final   Special Requests NONE  Final   Gram Stain   Final    ABUNDANT WBC PRESENT, PREDOMINANTLY PMN NO ORGANISMS SEEN    Culture NO GROWTH 2 DAYS  Final   Report Status PENDING  Incomplete  Surgical pcr screen     Status: None   Collection Time: 07/01/15 12:18 PM  Result Value Ref Range Status   MRSA, PCR NEGATIVE NEGATIVE Final   Staphylococcus aureus NEGATIVE NEGATIVE Final    Comment:        The Xpert SA Assay (FDA approved for NASAL specimens in patients over 94 years of age), is one component of a comprehensive surveillance program.  Test performance has been validated by [facility] for patients greater than or equal to 54 year old. It is not intended to diagnose infection nor to guide or monitor treatment.   Culture, body fluid-bottle     Status: None (Preliminary result)   Collection Time: 07/01/15  1:15 PM  Result Value Ref Range Status   Specimen Description SYNOVIAL LEFT KNEE  Final   Special Requests NONE  Final   Culture NO GROWTH < 24 HOURS  Final   Report Status PENDING  Incomplete  Gram stain     Status: None   Collection Time: 07/01/15  1:15 PM  Result Value Ref Range Status   Specimen Description SYNOVIAL LEFT KNEE  Final   Special Requests NONE  Final   Gram Stain   Final     ABUNDANT WBC PRESENT, PREDOMINANTLY PMN NO ORGANISMS SEEN    Report Status 07/01/2015  FINAL  Final      Studies: Dg Chest Port 1 View  07/03/2015  CLINICAL DATA:  Fever and shortness of Breath EXAM: PORTABLE CHEST 1 VIEW COMPARISON:  07/01/2015 FINDINGS: Cardiac shadow is stable with changes suggestive of left atrial enlargement. The lungs are well aerated with a minimal bibasilar atelectasis. No focal infiltrate or sizable effusion is seen. Prominent vessel on end is noted in the left mid lung. No acute bony abnormality is seen. IMPRESSION: Mild bibasilar atelectasis. Electronically Signed   By: Inez Catalina M.D.   On: 07/03/2015 09:35     Scheduled Meds: . aspirin EC  81 mg Oral Daily  . carvedilol  3.125 mg Oral BID WC  . cefTRIAXone (ROCEPHIN)  IV  1 g Intravenous Q24H  . vancomycin  1,000 mg Intravenous Q24H   Continuous Infusions: . sodium chloride 125 mL/hr (07/03/15 1145)   PRN Meds: acetaminophen **OR** acetaminophen, cyclobenzaprine, HYDROcodone-acetaminophen, traZODone  Time spent: 30 minutes  Author: Berle Mull, MD [facility] Pager: [phone] 07/03/2015 4:14 PM  If 7PM-7AM, please contact night-coverage at [email or web address], password [facility]

## 2015-07-04 ENCOUNTER — Inpatient Hospital Stay (HOSPITAL_COMMUNITY): Payer: Medicare Other

## 2015-07-04 LAB — CBC WITH DIFFERENTIAL/PLATELET
BASOS ABS: 0 10*3/uL (ref 0.0–0.1)
BASOS PCT: 0 %
EOS PCT: 7 %
Eosinophils Absolute: 0.6 10*3/uL (ref 0.0–0.7)
HCT: 24.2 % — ABNORMAL LOW (ref 39.0–52.0)
Hemoglobin: 8.4 g/dL — ABNORMAL LOW (ref 13.0–17.0)
Lymphocytes Relative: 25 %
Lymphs Abs: 2.1 10*3/uL (ref 0.7–4.0)
MCH: 31.8 pg (ref 26.0–34.0)
MCHC: 34.7 g/dL (ref 30.0–36.0)
MCV: 91.7 fL (ref 78.0–100.0)
Monocytes Absolute: 0.7 10*3/uL (ref 0.1–1.0)
Monocytes Relative: 8 %
NEUTROS PCT: 60 %
Neutro Abs: 5.1 10*3/uL (ref 1.7–7.7)
Platelets: 237 10*3/uL (ref 150–400)
RBC: 2.64 MIL/uL — ABNORMAL LOW (ref 4.22–5.81)
RDW: 14.3 % (ref 11.5–15.5)
WBC: 8.5 10*3/uL (ref 4.0–10.5)

## 2015-07-04 LAB — COMPREHENSIVE METABOLIC PANEL
ALT: 130 U/L — ABNORMAL HIGH (ref 17–63)
AST: 216 U/L — AB (ref 15–41)
Albumin: 1.8 g/dL — ABNORMAL LOW (ref 3.5–5.0)
Alkaline Phosphatase: 226 U/L — ABNORMAL HIGH (ref 38–126)
Anion gap: 8 (ref 5–15)
BILIRUBIN TOTAL: 0.6 mg/dL (ref 0.3–1.2)
BUN: 21 mg/dL — AB (ref 6–20)
CO2: 22 mmol/L (ref 22–32)
Calcium: 8.6 mg/dL — ABNORMAL LOW (ref 8.9–10.3)
Chloride: 111 mmol/L (ref 101–111)
Creatinine, Ser: 1.31 mg/dL — ABNORMAL HIGH (ref 0.61–1.24)
GFR calc Af Amer: 59 mL/min — ABNORMAL LOW (ref >60)
GFR, EST NON AFRICAN AMERICAN: 51 mL/min — AB (ref >60)
Glucose, Bld: 102 mg/dL — ABNORMAL HIGH (ref 65–99)
POTASSIUM: 4.1 mmol/L (ref 3.5–5.1)
Sodium: 141 mmol/L (ref 135–145)
TOTAL PROTEIN: 5.7 g/dL — AB (ref 6.5–8.1)

## 2015-07-04 LAB — BODY FLUID CULTURE: Culture: NO GROWTH

## 2015-07-04 LAB — SEDIMENTATION RATE: Sed Rate: 138 mm/hr — ABNORMAL HIGH (ref 0–16)

## 2015-07-04 LAB — VANCOMYCIN, TROUGH: VANCOMYCIN TR: 9 ug/mL — AB (ref 10.0–20.0)

## 2015-07-04 MED ORDER — DEXTROSE 5 % IV SOLN: 2.0000 g | INTRAVENOUS | Status: DC

## 2015-07-04 MED ORDER — DEXTROSE 5 % IV SOLN
2.0000 g | Freq: Two times a day (BID) | INTRAVENOUS | Status: DC
  Administered 2015-07-04 – 2015-07-05 (×2): 2 g via INTRAVENOUS
  Filled 2015-07-04 (×3): qty 2

## 2015-07-04 MED ORDER — VANCOMYCIN HCL IN DEXTROSE 750-5 MG/150ML-% IV SOLN
750.0000 mg | Freq: Two times a day (BID) | INTRAVENOUS | Status: DC
  Administered 2015-07-04: 750 mg via INTRAVENOUS
  Filled 2015-07-04 (×3): qty 150

## 2015-07-04 MED ORDER — DEXTROSE 5 % IV SOLN
1.0000 g | Freq: Once | INTRAVENOUS | Status: AC
  Administered 2015-07-04: 1 g via INTRAVENOUS
  Filled 2015-07-04: qty 10

## 2015-07-04 NOTE — Progress Notes (Signed)
Pharmacy Antibiotic Note  83 YOM to continue on vancomycin and ceftriaxone for septic knee.  Patient's renal function is improving and his vancomycin trough is sub-therapeutic.   Plan: - Change vanc to 750mg  IV Q12H, start tonight - Change CTX to 2gm IV Q24H - Monitor renal fxn, clinical progress, repeat VT as appropriate   Height: 5\' 11"  (180.3 cm) Weight: 173 lb 12.8 oz (78.835 kg) IBW/kg (Calculated) : 75.3  Temp (24hrs), Avg:99 F (37.2 C), Min:98.1 F (36.7 C), Max:99.4 F (37.4 C)   Recent Labs Lab 07/01/15 0341 07/01/15 0349 07/02/15 0409 07/03/15 0943 07/04/15 0531 07/04/15 1019  WBC 9.4  --  8.6 8.1 8.5  --   CREATININE 1.91*  --  1.59* 1.64* 1.31*  --   LATICACIDVEN  --  1.32  --   --   --   --   VANCOTROUGH  --   --   --   --   --  9*    Estimated Creatinine Clearance: 51.1 mL/min (by C-G formula based on Cr of 1.31).    Allergies  Allergen Reactions  . Ace Inhibitors     REACTION: swelling in face August 2010    Antimicrobials this admission: CTX 4/24 >> Vanc 4/24 >>  Dose adjustments this admission: 4/27 VT = 9 mcg/mL on 1gm q24 (SCr 1.31) >> 750mg  q12  Microbiology results: 4/24 left knee synovial fluid - negative 4/24 UCx - negative 4/24 BCx x2 - NGTD 4/24 left knee synovial fluid - pending   Kita Neace D. Mina Marble, PharmD, BCPS Pager:  [phone] 07/04/2015, 12:06 PM

## 2015-07-04 NOTE — Progress Notes (Signed)
Two Tylenol pills given and MD notified of temperature and nuchal rigidity.  See NO.

## 2015-07-04 NOTE — Progress Notes (Signed)
Discussed discharge instructions with patient and wife.  This included the use of pain medication for increased mobility and management of opioid associated constipation with fluids, mobility, and OTC stool softeners / laxatives.  Teach back method utilized to assess comprehension of instructions.  Operative site care also discussed.

## 2015-07-04 NOTE — Progress Notes (Signed)
Physical Therapy Treatment Patient Details Name: Luis Chen MRN: [id number] DOB: 30-Jun-1940 Today's Date: 07/04/2015    History of Present Illness pt s/p irrigation and debridement left knee arthroscopy 07/01/15, with PMHx of CAD, cardiomyopathy, HTN, and kidney disease     PT Comments    Pt moves slowly and has the most difficulty with transitional movements rather than gait.  Daughter present and supportive with plan being to d/c to her house with [name].  Follow Up Recommendations  Home health PT;Supervision for mobility/OOB     Equipment Recommendations  Rolling walker with 5" wheels;3in1 (PT) (already delivered to room)    Recommendations for Other Services       Precautions / Restrictions Precautions Precautions: Fall Restrictions Weight Bearing Restrictions: Yes LLE Weight Bearing: Weight bearing as tolerated    Mobility  Bed Mobility Overal bed mobility: Needs Assistance Bed Mobility: Rolling;Sidelying to Sit Rolling: Min assist Sidelying to sit: Mod assist       General bed mobility comments: oob  Transfers   Equipment used: Rolling walker (2 wheeled) Transfers: Sit to/from Stand Sit to Stand: Min assist;Mod assist         General transfer comment: Stood with MIN A from bed and MOD A from toilet  Ambulation/Gait Ambulation/Gait assistance: Min guard Ambulation Distance (Feet): 120 Feet Assistive device: Rolling walker (2 wheeled) Gait Pattern/deviations: Decreased step length - right;Decreased step length - left;Trunk flexed;Narrow base of support   Gait velocity interpretation: Below normal speed for age/gender General Gait Details: Gait improves as he progresses.  Cues for safety with turns. decreased LE extension. Pt reports R ankle pain not increased with gait.   Stairs            Wheelchair Mobility    Modified Rankin (Stroke Patients Only)       Balance     Sitting balance-Leahy Scale: Fair     Standing balance support:  Bilateral upper extremity supported;During functional activity Standing balance-Leahy Scale: Fair Standing balance comment: Pt able to stand and wipe himself at toilet with MIN/guard                    Cognition Arousal/Alertness: Awake/alert Behavior During Therapy: WFL for tasks assessed/performed Overall Cognitive Status: Within Functional Limits for tasks assessed                      Exercises General Exercises - Lower Extremity Ankle Circles/Pumps: AROM;Both;Supine;5 reps Heel Slides: AROM;Both;5 reps    General Comments        Pertinent Vitals/Pain Pain Assessment: Faces Faces Pain Scale: Hurts even more Pain Location: neck and R ankle Pain Descriptors / Indicators: Aching;Other (Comment);Sore (sore to the touch) Pain Intervention(s): Limited activity within patient's tolerance;Monitored during session;Patient requesting pain meds-RN notified    Home Living Family/patient expects to be discharged to:: Private residence Living Arrangements: Alone Available Help at Discharge: Available PRN/intermittently;Family           Additional Comments: 3;1 was delivered. Plans to stay with daughter in [place].  She has grab bars in tub    Prior Function Level of Independence: Independent          PT Goals (current goals can now be found in the care plan section) Acute Rehab PT Goals Patient Stated Goal: return to bowling PT Goal Formulation: With patient/family Time For Goal Achievement: 07/16/15 Potential to Achieve Goals: Fair Progress towards PT goals: Progressing toward goals;Goals met and updated - see care plan  Frequency  Min 3X/week    PT Plan Current plan remains appropriate    Co-evaluation             End of Session Equipment Utilized During Treatment: Gait belt Activity Tolerance: Patient tolerated treatment well Patient left: in chair;with call bell/phone within reach;with family/visitor present     Time: 4458-4835 PT Time  Calculation (min) (ACUTE ONLY): 36 min  Charges:  $Gait Training: 8-22 mins $Therapeutic Activity: 8-22 mins                    G Codes:      [name] 07/04/2015, 11:43 AM

## 2015-07-04 NOTE — Progress Notes (Signed)
Pharmacy Antibiotic Note  12 YOM on vancomycin and ceftriaxone for septic knee.  He continue having fevers. Will switch rocephin to fortaz for broader coverage. Est. crcl ~ 50 ml/min.   Plan: - ceftazidime 2g IV Q 12 hrs - Monitor renal fxn, clinical progress  Height: 5\' 11"  (180.3 cm) Weight: 173 lb 12.8 oz (78.835 kg) IBW/kg (Calculated) : 75.3  Temp (24hrs), Avg:99.9 F (37.7 C), Min:98.6 F (37 C), Max:102.3 F (39.1 C)   Recent Labs Lab 07/01/15 0341 07/01/15 0349 07/02/15 0409 07/03/15 0943 07/04/15 0531 07/04/15 1019  WBC 9.4  --  8.6 8.1 8.5  --   CREATININE 1.91*  --  1.59* 1.64* 1.31*  --   LATICACIDVEN  --  1.32  --   --   --   --   VANCOTROUGH  --   --   --   --   --  9*    Estimated Creatinine Clearance: 51.1 mL/min (by C-G formula based on Cr of 1.31).    Allergies  Allergen Reactions  . Ace Inhibitors     REACTION: swelling in face August 2010    Antimicrobials this admission: CTX 4/24 >> 4/27 Vanc 4/24 >> 4/27 >>  Dose adjustments this admission: 4/27 VT = 9 mcg/mL on 1gm q24 (SCr 1.31) >> 750mg  q12  Microbiology results: 4/24 left knee synovial fluid - negative 4/24 UCx - negative 4/24 BCx x2 - NGTD 4/24 left knee synovial fluid - pending   Maryanna Shape, PharmD, BCPS  Clinical Pharmacist  Pager: [phone]   07/04/2015, 4:24 PM

## 2015-07-04 NOTE — Evaluation (Signed)
Occupational Therapy Evaluation Patient Details Name: Luis Chen MRN: [id number] DOB: [birth year] Today's Date: 07/04/2015    History of Present Illness pt s/p irrigation and debridement left knee arthroscopy 07/01/15, with PMHx of CAD, cardiomyopathy, HTN, and kidney disease    Clinical Impression   Pt was admitted for the above.  Pt had multiple areas of pain during OT evaluation.  Pt needs mod A for sit to stand during ADLs and transfers.  Goals are for min A in acute setting.  He was independent prior to admission    Follow Up Recommendations  Supervision/Assistance - 24 hour (initially)    Equipment Recommendations   (3:1 was delivered)    Recommendations for Other Services       Precautions / Restrictions Precautions Precautions: Fall Restrictions Weight Bearing Restrictions: Yes LLE Weight Bearing: Weight bearing as tolerated      Mobility Bed Mobility               General bed mobility comments: oob  Transfers   Equipment used: Rolling walker (2 wheeled) Transfers: Sit to/from Stand Sit to Stand: Mod assist         General transfer comment: assist to power up and stabilize.  Cues for UE placement    Balance                                            ADL Overall ADL's : Needs assistance/impaired     Grooming: Min guard;Standing       Lower Body Bathing: Minimal assistance;Sit to/from stand       Lower Body Dressing: Minimal assistance;Sit to/from stand   Toilet Transfer: Moderate assistance;Ambulation;Comfort height toilet;RW (mod A for sit to stand)   Toileting- Clothing Manipulation and Hygiene: Minimal assistance (when standing; mod A to stand)         General ADL Comments: educated on AE. Pt may be interested in reacher as he will be alone sometimes and doesn't feel he can wait for daughter to pick up whatever falls.  He can perform UB adls with set up. Pt is independent-natured.  Has most difficulty with  sit to stand as component of adls/transfers     Vision     Perception     Praxis      Pertinent Vitals/Pain Pain Assessment: Faces Faces Pain Scale: Hurts even more Pain Location: neck, R hip, R ankle; L knee not really hurting Pain Descriptors / Indicators: Aching Pain Intervention(s): Limited activity within patient's tolerance;Monitored during session;Premedicated before session;Repositioned     Hand Dominance     Extremity/Trunk Assessment Upper Extremity Assessment RUE Deficits / Details: moving RUE slowly, denies pain LUE Deficits / Details: ROM now WFLs but still has some pain; MMT deferred           Communication Communication Communication: No difficulties   Cognition Arousal/Alertness: Awake/alert Behavior During Therapy: WFL for tasks assessed/performed Overall Cognitive Status: Within Functional Limits for tasks assessed                     General Comments       Exercises       Shoulder Instructions      Home Living Family/patient expects to be discharged to:: Private residence Living Arrangements: Alone Available Help at Discharge: Available PRN/intermittently;Family  Bathroom Shower/Tub: Teacher, early years/pre: Standard         Additional Comments: 3;1 was delivered. Plans to stay with daughter in [place].  She has grab bars in tub      Prior Functioning/Environment Level of Independence: Independent             OT Diagnosis: Generalized weakness;Acute pain   OT Problem List: Decreased strength;Decreased activity tolerance;Pain   OT Treatment/Interventions: Self-care/ADL training;DME and/or AE instruction;Patient/family education    OT Goals(Current goals can be found in the care plan section) Acute Rehab OT Goals Patient Stated Goal: return to bowling OT Goal Formulation: With patient Time For Goal Achievement: 07/11/15 Potential to Achieve Goals: Good ADL Goals Pt Will Transfer to  Toilet: with min assist;ambulating;bedside commode Pt Will Perform Tub/Shower Transfer: Tub transfer;ambulating;tub bench;shower seat;with min assist Additional ADL Goal #1: pt will go from sit to stand with min A for transfers and during ADLs  OT Frequency: Min 2X/week   Barriers to D/C:            Co-evaluation              End of Session    Activity Tolerance: Patient tolerated treatment well Patient left: in chair;with call bell/phone within reach   Time: 1002-1031 OT Time Calculation (min): 29 min Charges:  OT General Charges $OT Visit: 1 Procedure OT Evaluation $OT Eval Low Complexity: 1 Procedure OT Treatments $Self Care/Home Management : 8-22 mins G-Codes:    Luis Chen 07-19-15, 11:12 AM Lesle Chris, OTR/L [phone] 07-19-2015

## 2015-07-04 NOTE — Progress Notes (Signed)
[facility] Progress Note  Patient: Luis Chen MRN:[id number]   PCP: ANDY,CAMILLE L, MD DOB: 02/01/1941   DOA: 07/01/2015   DOS: 07/04/2015   Date of Service: the patient was seen and examined on 07/04/2015 Outpatient Specialists: Cardiology Dr. Hochrein in the past  Subjective: Patient continues to have on and off fever. He mentions he is feeling better. Complains of right ankle pain as well as complaints of neck stiffness. No nausea no vomiting no abdominal pain Nutrition: Tolerating oral diet  Brief hospital course: Patient was admitted on 07/01/2015, with complaint of left knee pain, was found to have left knee septic arthritis. Currently on IV antibiotics. Patient underwent incision and debridement by orthopedic on 07/01/2015. Culture is growing no organisms, blood culture is also negative, significant elevation of her ESR CRP as well as rheumatoid factor. With persistent fever antibiotic coverage was broadened. Currently further plan is further workup of inflammatory arthropathy, identifying possible infectious etiology as well as workup for neck pain  Assessment and Plan: 1. Pyogenic arthritis of left knee joint (HCC)  Polyarthritis.  Status post irrigation and debridement of the left knee arthroscopy, no excision. Patient has been started on vancomycin and ceftriaxone since admission. MRSA PCR is negative. Synovial fluid culture is negative. Blood culture is also negative. HIV negative, RPR negative ESR 125, CRP 33.2. Mental factor elevated Borrelia negative. With persistent fever I discussed with infectious disease recommends to check with orthopedic if they can of pain another sample from another joint for analysis. Consulting orthopedics for the same. Also recommended to get MRI neck as well as further workup related to inflammatory arthropathy. Once infection is ruled out anti-inflammatory medication/prednisone can safely be started, since patient's multiple joint  involvement with negative blood culture and no improvement on ceftriaxone suggest less likely infectious etiology and more likely inflammatory etiology. Currently with persistent fever and without any appropriate etiology I will broaden the coverage with Fortaz.  2. Bilateral atelectasis. Likely the cause of patient's fever. Recommended to use incentive spirometry. Next and continue vancomycin and ceftriaxone. Next and monitor cultures.  3. Coronary artery disease. Prior history of nonischemic cardio myopathy. Recent echocardiogram in 2013 shows normal ejection fraction with grade 1 diastolic dysfunction. Continue beta blocker, aspirin. Hold statin in the setting of elevated CPK.  4. Acute on chronic kidney disease stage III. Renal function mildly worsened on admission likely due to dehydration. Improving rapidly. CK has been elevated on admission and has further worsened We will continue with aggressive IV hydration.  5. Anemia of chronic disease. Iron supplementation  6. Nontraumatic rhabdomyolysis. Mild elevation of the CPK.  continue aggressive IV hydration.  Activity: physical therapy recommends home health PT Bowel regimen: last BM 06/30/2015, bowel regimen ordered Diet: Cardiac diet DVT Prophylaxis: subcutaneous Heparin  Advance goals of care discussion: Full code  Family Communication: family was present at bedside, at the time of interview. The pt provided permission to discuss medical plan with the family. Opportunity was given to ask question and all questions were answered satisfactorily.   Disposition:  Expected discharge date: 07/06/2015 Barriers to safe discharge: Final disposition regarding antibiotics  Consultants: Orthopedics Procedures: left knee arthroscopy, irrigation and debridement without excision  Antibiotics: Anti-infectives    Start     Dose/Rate Route Frequency Ordered Stop   07/05/15 1300  cefTRIAXone (ROCEPHIN) 2 g in dextrose 5 % 50 mL IVPB   Status:  Discontinued     2 g 100 mL/hr over 30 Minutes Intravenous Every 24 hours 07/04/15   1208 07/04/15 1618   07/04/15 2300  vancomycin (VANCOCIN) IVPB 750 mg/150 ml premix     750 mg 150 mL/hr over 60 Minutes Intravenous Every 12 hours 07/04/15 1207     07/04/15 1700  cefTAZidime (FORTAZ) 2 g in dextrose 5 % 50 mL IVPB     2 g 100 mL/hr over 30 Minutes Intravenous Every 12 hours 07/04/15 1630     07/04/15 1215  cefTRIAXone (ROCEPHIN) 1 g in dextrose 5 % 50 mL IVPB     1 g 100 mL/hr over 30 Minutes Intravenous  Once 07/04/15 1208 07/04/15 1331   07/02/15 1100  vancomycin (VANCOCIN) IVPB 1000 mg/200 mL premix     1,000 mg 200 mL/hr over 60 Minutes Intravenous Every 24 hours 07/01/15 1112 07/04/15 1207   07/02/15 1030  cefTRIAXone (ROCEPHIN) 1 g in dextrose 5 % 50 mL IVPB  Status:  Discontinued     1 g 100 mL/hr over 30 Minutes Intravenous Every 24 hours 07/01/15 1112 07/04/15 1208   07/02/15 0600  ceFAZolin (ANCEF) IVPB 2g/100 mL premix     2 g 200 mL/hr over 30 Minutes Intravenous On call to O.R. 07/01/15 1205 07/01/15 1335   07/01/15 1900  ceFAZolin (ANCEF) IVPB 2g/100 mL premix     2 g 200 mL/hr over 30 Minutes Intravenous Every 6 hours 07/01/15 1508 07/02/15 0644   07/01/15 1209  ceFAZolin (ANCEF) 2-4 GM/100ML-% IVPB    Comments:  [name], [name]   : cabinet override      07/01/15 1209 07/02/15 0014   07/01/15 1045  vancomycin (VANCOCIN) IVPB 1000 mg/200 mL premix     1,000 mg 200 mL/hr over 60 Minutes Intravenous  Once 07/01/15 1032 07/01/15 1330   07/01/15 1045  cefTRIAXone (ROCEPHIN) 1 g in dextrose 5 % 50 mL IVPB     1 g 100 mL/hr over 30 Minutes Intravenous  Once 07/01/15 1032 07/01/15 1118        Intake/Output Summary (Last 24 hours) at 07/04/15 1737 Last data filed at 07/04/15 1400  Gross per 24 hour  Intake 4495.84 ml  Output    200 ml  Net 4295.84 ml   Filed Weights   07/02/15 0500 07/03/15 0500 07/04/15 0500  Weight: 73.165 kg (161 lb 4.8 oz) 73.891 kg (162  lb 14.4 oz) 78.835 kg (173 lb 12.8 oz)    Objective: Physical Exam: Filed Vitals:   07/04/15 0500 07/04/15 0544 07/04/15 1250 07/04/15 1606  BP:  144/68 158/70   Pulse:  81 95   Temp:  99.4 F (37.4 C) 102.3 F (39.1 C) 98.6 F (37 C)  TempSrc:  Oral Oral Oral  Resp:  18 18   Height:      Weight: 78.835 kg (173 lb 12.8 oz)     SpO2:  97% 96%     General: Appear in mild distress, no Rash; Oral Mucosa moist.Some neck pain with tenderness Cardiovascular: S1 and S2 Present, no Murmur, no JVD Respiratory: Bilateral Air entry present and Clear to Auscultation, no Crackles, no wheezes Abdomen: Bowel Sound present, Soft and no tenderness Extremities: trace Pedal edema, no calf tenderness  Data Reviewed: CBC:  Recent Labs Lab 07/01/15 0341 07/02/15 0409 07/03/15 0943 07/04/15 0531  WBC 9.4 8.6 8.1 8.5  NEUTROABS 7.3  --  5.9 5.1  HGB 9.7* 8.8* 8.3* 8.4*  HCT 29.5* 26.1* 24.9* 24.2*  MCV 93.1 91.3 90.9 91.7  PLT 266 240 240 177   Basic Metabolic Panel:  Recent Labs Lab  07/01/15 0341 07/02/15 0409 07/03/15 0943 07/04/15 0531  NA 143 136 139 141  K 4.4 4.0 4.2 4.1  CL 107 105 108 111  CO2 25 20* 22 22  GLUCOSE 130* 139* 177* 102*  BUN 30* 22* 20 21*  CREATININE 1.91* 1.59* 1.64* 1.31*  CALCIUM 9.4 8.4* 8.7* 8.6*   GFR: Estimated Creatinine Clearance: 51.1 mL/min (by C-G formula based on Cr of 1.31). Liver Function Tests:  Recent Labs Lab 07/01/15 0341 07/02/15 0409 07/04/15 0531  AST 75* 104* 216*  ALT 49 49 130*  ALKPHOS 155* 147* 226*  BILITOT 1.4* 0.8 0.6  PROT 7.5 6.1* 5.7*  ALBUMIN 2.9* 2.2* 1.8*   No results for input(s): LIPASE, AMYLASE in the last 168 hours. No results for input(s): AMMONIA in the last 168 hours. Coagulation Profile: No results for input(s): INR, PROTIME in the last 168 hours. Cardiac Enzymes:  Recent Labs Lab 07/01/15 0341 07/03/15 0943  CKTOTAL 737* 1764*   BNP (last 3 results) No results for input(s): PROBNP in the  last 8760 hours. HbA1C: No results for input(s): HGBA1C in the last 72 hours. CBG: No results for input(s): GLUCAP in the last 168 hours. Lipid Profile: No results for input(s): CHOL, HDL, LDLCALC, TRIG, CHOLHDL, LDLDIRECT in the last 72 hours. Thyroid Function Tests: No results for input(s): TSH, T4TOTAL, FREET4, T3FREE, THYROIDAB in the last 72 hours. Anemia Panel: No results for input(s): VITAMINB12, FOLATE, FERRITIN, TIBC, IRON, RETICCTPCT in the last 72 hours. Urine analysis:    Component Value Date/Time   COLORURINE YELLOW 07/01/2015 0416   APPEARANCEUR CLEAR 07/01/2015 0416   LABSPEC 1.018 07/01/2015 0416   PHURINE 5.5 07/01/2015 0416   GLUCOSEU NEGATIVE 07/01/2015 0416   HGBUR MODERATE* 07/01/2015 0416   BILIRUBINUR NEGATIVE 07/01/2015 0416   KETONESUR 15* 07/01/2015 0416   PROTEINUR 30* 07/01/2015 0416   UROBILINOGEN 0.2 10/19/2008 0657   NITRITE NEGATIVE 07/01/2015 0416   LEUKOCYTESUR NEGATIVE 07/01/2015 0416   Sepsis Labs: @LABRCNTIP(procalcitonin:4,lacticidven:4)  ) Recent Results (from the past 240 hour(s))  Blood Culture (routine x 2)     Status: None (Preliminary result)   Collection Time: 07/01/15  3:35 AM  Result Value Ref Range Status   Specimen Description BLOOD RIGHT ARM  Final   Special Requests   Final    BOTTLES DRAWN AEROBIC AND ANAEROBIC 10CC BLUE 5CC RED   Culture NO GROWTH 3 DAYS  Final   Report Status PENDING  Incomplete  Blood Culture (routine x 2)     Status: None (Preliminary result)   Collection Time: 07/01/15  3:40 AM  Result Value Ref Range Status   Specimen Description BLOOD LEFT ARM  Final   Special Requests IN PEDIATRIC BOTTLE 3CC  Final   Culture NO GROWTH 3 DAYS  Final   Report Status PENDING  Incomplete  Urine culture     Status: None   Collection Time: 07/01/15  4:16 AM  Result Value Ref Range Status   Specimen Description URINE, RANDOM  Final   Special Requests NONE  Final   Culture NO GROWTH 1 DAY  Final   Report Status  07/02/2015 FINAL  Final  Body fluid culture     Status: None   Collection Time: 07/01/15  6:11 AM  Result Value Ref Range Status   Specimen Description FLUID SYNOVIAL LEFT KNEE  Final   Special Requests NONE  Final   Gram Stain   Final    ABUNDANT WBC PRESENT, PREDOMINANTLY PMN NO ORGANISMS SEEN      Culture NO GROWTH 3 DAYS  Final   Report Status 07/04/2015 FINAL  Final  Surgical pcr screen     Status: None   Collection Time: 07/01/15 12:18 PM  Result Value Ref Range Status   MRSA, PCR NEGATIVE NEGATIVE Final   Staphylococcus aureus NEGATIVE NEGATIVE Final    Comment:        The Xpert SA Assay (FDA approved for NASAL specimens in patients over 21 years of age), is one component of a comprehensive surveillance program.  Test performance has been validated by [facility] for patients greater than or equal to 1 year old. It is not intended to diagnose infection nor to guide or monitor treatment.   Culture, body fluid-bottle     Status: None (Preliminary result)   Collection Time: 07/01/15  1:15 PM  Result Value Ref Range Status   Specimen Description SYNOVIAL LEFT KNEE  Final   Special Requests NONE  Final   Culture NO GROWTH 3 DAYS  Final   Report Status PENDING  Incomplete  Gram stain     Status: None   Collection Time: 07/01/15  1:15 PM  Result Value Ref Range Status   Specimen Description SYNOVIAL LEFT KNEE  Final   Special Requests NONE  Final   Gram Stain   Final    ABUNDANT WBC PRESENT, PREDOMINANTLY PMN NO ORGANISMS SEEN    Report Status 07/01/2015 FINAL  Final      Studies: Ct Head Wo Contrast  07/04/2015  CLINICAL DATA:  Fever EXAM: CT HEAD WITHOUT CONTRAST TECHNIQUE: Contiguous axial images were obtained from the base of the skull through the vertex without intravenous contrast. COMPARISON:  None. FINDINGS: No evidence of parenchymal hemorrhage or extra-axial fluid collection. No mass lesion, mass effect, or midline shift. No CT evidence of acute  infarction. Subcortical white matter and periventricular small vessel ischemic changes. Cerebral volume is within normal limits.  No ventriculomegaly. The visualized paranasal sinuses are essentially clear. The mastoid air cells are unopacified. No evidence of calvarial fracture. IMPRESSION: No evidence of acute intracranial abnormality. Mild small vessel ischemic changes. Electronically Signed   By: Sriyesh  Krishnan M.D.   On: 07/04/2015 14:42     Scheduled Meds: . aspirin EC  81 mg Oral Daily  . carvedilol  3.125 mg Oral BID WC  . cefTAZidime (FORTAZ)  IV  2 g Intravenous Q12H  . vancomycin  750 mg Intravenous Q12H   Continuous Infusions: . sodium chloride 125 mL/hr (07/04/15 1106)   PRN Meds: acetaminophen **OR** acetaminophen, cyclobenzaprine, HYDROcodone-acetaminophen, traZODone  Time spent: 30 minutes  Author: Pranav Patel, MD [facility] Pager: [phone] 07/04/2015 5:37 PM  If 7PM-7AM, please contact night-coverage at [email or web address], password [email or web address]  

## 2015-07-05 LAB — TYPE AND SCREEN
ABO/RH(D): A POS
ANTIBODY SCREEN: NEGATIVE

## 2015-07-05 LAB — LACTATE DEHYDROGENASE: LDH: 195 U/L — AB (ref 98–192)

## 2015-07-05 LAB — VITAMIN B12: Vitamin B-12: 265 pg/mL (ref 180–914)

## 2015-07-05 LAB — CBC WITH DIFFERENTIAL/PLATELET
Basophils Absolute: 0 10*3/uL (ref 0.0–0.1)
Basophils Relative: 0 %
EOS PCT: 6 %
Eosinophils Absolute: 0.4 10*3/uL (ref 0.0–0.7)
HEMATOCRIT: 23.1 % — AB (ref 39.0–52.0)
Hemoglobin: 7.8 g/dL — ABNORMAL LOW (ref 13.0–17.0)
LYMPHS PCT: 24 %
Lymphs Abs: 1.9 10*3/uL (ref 0.7–4.0)
MCH: 30.5 pg (ref 26.0–34.0)
MCHC: 33.8 g/dL (ref 30.0–36.0)
MCV: 90.2 fL (ref 78.0–100.0)
MONO ABS: 0.7 10*3/uL (ref 0.1–1.0)
MONOS PCT: 9 %
NEUTROS ABS: 4.7 10*3/uL (ref 1.7–7.7)
Neutrophils Relative %: 61 %
PLATELETS: 268 10*3/uL (ref 150–400)
RBC: 2.56 MIL/uL — ABNORMAL LOW (ref 4.22–5.81)
RDW: 14 % (ref 11.5–15.5)
WBC: 7.8 10*3/uL (ref 4.0–10.5)

## 2015-07-05 LAB — COMPREHENSIVE METABOLIC PANEL
ALBUMIN: 1.8 g/dL — AB (ref 3.5–5.0)
ALK PHOS: 239 U/L — AB (ref 38–126)
ALT: 95 U/L — ABNORMAL HIGH (ref 17–63)
ANION GAP: 9 (ref 5–15)
AST: 105 U/L — AB (ref 15–41)
BILIRUBIN TOTAL: 0.5 mg/dL (ref 0.3–1.2)
BUN: 15 mg/dL (ref 6–20)
CO2: 21 mmol/L — AB (ref 22–32)
Calcium: 8.7 mg/dL — ABNORMAL LOW (ref 8.9–10.3)
Chloride: 110 mmol/L (ref 101–111)
Creatinine, Ser: 1.22 mg/dL (ref 0.61–1.24)
GFR calc Af Amer: >60 mL/min (ref >60)
GFR calc non Af Amer: 56 mL/min — ABNORMAL LOW (ref >60)
GLUCOSE: 101 mg/dL — AB (ref 65–99)
POTASSIUM: 4.1 mmol/L (ref 3.5–5.1)
SODIUM: 140 mmol/L (ref 135–145)
TOTAL PROTEIN: 5.9 g/dL — AB (ref 6.5–8.1)

## 2015-07-05 LAB — HEMOGLOBIN AND HEMATOCRIT, BLOOD
HCT: 23.5 % — ABNORMAL LOW (ref 39.0–52.0)
Hemoglobin: 7.9 g/dL — ABNORMAL LOW (ref 13.0–17.0)

## 2015-07-05 LAB — ABO/RH: ABO/RH(D): A POS

## 2015-07-05 LAB — FOLATE: Folate: 15.7 ng/mL (ref >5.9)

## 2015-07-05 LAB — HEPATITIS PANEL, ACUTE
HCV Ab: 0.1 s/co ratio (ref 0.0–0.9)
HEP A IGM: NEGATIVE
HEP B C IGM: NEGATIVE
HEP B S AG: NEGATIVE

## 2015-07-05 LAB — IRON AND TIBC
Iron: 24 ug/dL — ABNORMAL LOW (ref 45–182)
SATURATION RATIOS: 20 % (ref 17.9–39.5)
TIBC: 122 ug/dL — AB (ref 250–450)
UIBC: 98 ug/dL

## 2015-07-05 LAB — PARVOVIRUS B19 ANTIBODY, IGG AND IGM
Parovirus B19 IgG Abs: 4.6 index — ABNORMAL HIGH (ref 0.0–0.8)
Parovirus B19 IgM Abs: 0.1 index (ref 0.0–0.8)

## 2015-07-05 LAB — FERRITIN: Ferritin: 2441 ng/mL — ABNORMAL HIGH (ref 24–336)

## 2015-07-05 LAB — CYCLIC CITRUL PEPTIDE ANTIBODY, IGG/IGA: CCP ANTIBODIES IGG/IGA: 5 U (ref 0–19)

## 2015-07-05 LAB — CK: Total CK: 580 U/L — ABNORMAL HIGH (ref 49–397)

## 2015-07-05 LAB — ANTINUCLEAR ANTIBODIES, IFA: ANA Ab, IFA: NEGATIVE

## 2015-07-05 MED ORDER — ENSURE ENLIVE PO LIQD
237.0000 mL | Freq: Three times a day (TID) | ORAL | Status: DC
  Administered 2015-07-05: 237 mL via ORAL

## 2015-07-05 MED ORDER — LEVOFLOXACIN 500 MG PO TABS
750.0000 mg | ORAL_TABLET | Freq: Every day | ORAL | Status: DC
  Administered 2015-07-05 – 2015-07-06 (×2): 750 mg via ORAL
  Filled 2015-07-05 (×2): qty 2

## 2015-07-05 MED ORDER — FERROUS SULFATE 325 (65 FE) MG PO TABS
325.0000 mg | ORAL_TABLET | Freq: Three times a day (TID) | ORAL | Status: DC
  Administered 2015-07-05 – 2015-07-06 (×3): 325 mg via ORAL
  Filled 2015-07-05 (×3): qty 1

## 2015-07-05 NOTE — Progress Notes (Signed)
Pharmacy Antibiotic Note  58 YOM on vancomycin for septic knee.  He continues having fevers. Est. crcl ~ 50 ml/min. Now starting Levaquin   Plan: - Discontinue ceftazidime - Start Levaquin 750 mg q24h - Monitor renal fxn, clinical progress  Height: 5\' 11"  (180.3 cm) Weight: 173 lb 12.8 oz (78.835 kg) IBW/kg (Calculated) : 75.3  Temp (24hrs), Avg:100 F (37.8 C), Min:98.6 F (37 C), Max:102.3 F (39.1 C)   Recent Labs Lab 07/01/15 0341 07/01/15 0349 07/02/15 0409 07/03/15 0943 07/04/15 0531 07/04/15 1019 07/05/15 0552  WBC 9.4  --  8.6 8.1 8.5  --  7.8  CREATININE 1.91*  --  1.59* 1.64* 1.31*  --  1.22  LATICACIDVEN  --  1.32  --   --   --   --   --   VANCOTROUGH  --   --   --   --   --  9*  --     Estimated Creatinine Clearance: 54.9 mL/min (by C-G formula based on Cr of 1.22).    Allergies  Allergen Reactions  . Ace Inhibitors     REACTION: swelling in face August 2010    Antimicrobials this admission: Rocephin 4/24>> 4/27 Vanc 4/24>> Tressie Ellis 4/27 >>4/28 Levaquin 4/28>>  Dose adjustments this admission: 4/27 VT = 9 mcg/mL on 1gm q24 (SCr 1.31) >> 750mg  q12  Microbiology results: 4/24 left knee synovial fluid - ngf 4/24 UCx - negative 4/24 BC x2 - NGTD 4/27 BC - sent   Thank you for allowing [place] to participate in this patients care. Jens Som, PharmD Pager: [phone] 07/05/2015, 11:12 AM

## 2015-07-05 NOTE — Care Management Important Message (Signed)
Important Message  Patient Details  Name: Luis Chen MRN: [id number] Date of Birth: 1940-06-13   Medicare Important Message Given:  Yes    Nila Nephew, RN 07/05/2015, 3:25 PM

## 2015-07-05 NOTE — Progress Notes (Signed)
[facility] Progress Note  Patient: Luis Chen IHW:[id number]   PCP: Leamon Arnt, MD DOB: 1940/12/29   DOA: 07/01/2015   DOS: 07/05/2015   Date of Service: the patient was seen and examined on 07/05/2015 Outpatient Specialists: Cardiology Dr. Percival Spanish in the past  Subjective: Patient is feeling better, joint pain is improving. Patient has been ambulating more with physical therapy. Nutrition: Tolerating oral diet  Brief hospital course: Patient was admitted on 07/01/2015, with complaint of left knee pain, was found to have left knee septic arthritis. Currently on IV antibiotics. Patient underwent incision and debridement by orthopedic on 07/01/2015. Culture is growing no organisms, blood culture is also negative, significant elevation of her ESR CRP as well as rheumatoid factor. With persistent fever antibiotic coverage was broadened. Currently further plan is further workup of inflammatory arthropathy, identifying possible infectious etiology as well as workup for neck pain  Assessment and Plan: 1. Pyogenic arthritis of left knee joint (HCC)  Polyarthritis.  Status post irrigation and debridement of the left knee arthroscopy, no excision. Patient has been started on vancomycin and ceftriaxone since admission. MRSA PCR is negative. Synovial fluid culture is negative. Blood culture is also negative. HIV negative, RPR negative ESR 125, CRP 33.2. Mental factor elevated Borrelia negative. With persistent fever I discussed with infectious disease Orthopedic was also consulted to help assist getting a repeat sample arthrocentesis, although they feel that the patient does not have any significant evidence of joint effusion elsewhere.  Infectious disease, Also recommended to get MRI neck as well as further workup related to inflammatory arthropathy. Once infection is ruled out anti-inflammatory medication/prednisone can safely be started, since patient's multiple joint involvement with  negative blood culture and no improvement on ceftriaxone suggest less likely infectious etiology and more likely inflammatory etiology.  At present I will transition the patient to oral Levaquin and monitor blood cultures.  2. Neck pain. MRI of the neck is showing evidence of infiltrative disease in the bone marrow. Due to neck pain as well as fever with suspicions of CNS infection a CT scan of the head was also performed. CT scan does not show any evidence of lactic lesion. Patient has prior history of abnormal light chain ratio. We will recheck the light chain as well as SPEP.  3. Abnormal T2 signal on C4 and C6. MRI also shows possible spots concerning for metastasis. Patient will require further outpatient workup  4. Inflammatory arthritis. Recheck ESR continues to remain elevated. I would check CCP, parvovirus B19, ENA, hepatitis panel, LDH.  5. Bilateral atelectasis. Recommended to use incentive spirometry.  Transition to Levaquin.  6. Coronary artery disease. Prior history of nonischemic cardio myopathy. Recent echocardiogram in 2013 shows normal ejection fraction with grade 1 diastolic dysfunction. Continue beta blocker, aspirin. Hold statin in the setting of elevated CPK.  7. Acute on chronic kidney disease stage III. Renal function mildly worsened on admission likely due to dehydration. Improving rapidly. CK has been elevated on admission and has further worsened We will continue with aggressive IV hydration.  8. Anemia of chronic disease. Iron supplementation  9. Nontraumatic rhabdomyolysis. Mild elevation of the CPK.  continue IV hydration. Hold statin.  Activity: physical therapy recommends home health PT Bowel regimen: last BM 07/05/2015, bowel regimen ordered Diet: Cardiac diet DVT Prophylaxis: subcutaneous Heparin  Advance goals of care discussion: Full code  Family Communication: family was present at bedside, at the time of interview. The pt provided  permission to discuss medical plan with the family.  Opportunity was given to ask question and all questions were answered satisfactorily.   Disposition:  Expected discharge date: 07/07/2015 Barriers to safe discharge: Final disposition regarding antibiotics  Consultants: Orthopedics Procedures: left knee arthroscopy, irrigation and debridement without excision  Antibiotics: Anti-infectives    Start     Dose/Rate Route Frequency Ordered Stop   07/05/15 1300  cefTRIAXone (ROCEPHIN) 2 g in dextrose 5 % 50 mL IVPB  Status:  Discontinued     2 g 100 mL/hr over 30 Minutes Intravenous Every 24 hours 07/04/15 1208 07/04/15 1618   07/05/15 1200  levofloxacin (LEVAQUIN) tablet 750 mg     750 mg Oral Daily 07/05/15 1111     07/04/15 2300  vancomycin (VANCOCIN) IVPB 750 mg/150 ml premix  Status:  Discontinued     750 mg 150 mL/hr over 60 Minutes Intravenous Every 12 hours 07/04/15 1207 07/05/15 1053   07/04/15 1700  cefTAZidime (FORTAZ) 2 g in dextrose 5 % 50 mL IVPB  Status:  Discontinued     2 g 100 mL/hr over 30 Minutes Intravenous Every 12 hours 07/04/15 1630 07/05/15 1054   07/04/15 1215  cefTRIAXone (ROCEPHIN) 1 g in dextrose 5 % 50 mL IVPB     1 g 100 mL/hr over 30 Minutes Intravenous  Once 07/04/15 1208 07/04/15 1331   07/02/15 1100  vancomycin (VANCOCIN) IVPB 1000 mg/200 mL premix     1,000 mg 200 mL/hr over 60 Minutes Intravenous Every 24 hours 07/01/15 1112 07/04/15 1207   07/02/15 1030  cefTRIAXone (ROCEPHIN) 1 g in dextrose 5 % 50 mL IVPB  Status:  Discontinued     1 g 100 mL/hr over 30 Minutes Intravenous Every 24 hours 07/01/15 1112 07/04/15 1208   07/02/15 0600  ceFAZolin (ANCEF) IVPB 2g/100 mL premix     2 g 200 mL/hr over 30 Minutes Intravenous On call to O.R. 07/01/15 1205 07/01/15 1335   07/01/15 1900  ceFAZolin (ANCEF) IVPB 2g/100 mL premix     2 g 200 mL/hr over 30 Minutes Intravenous Every 6 hours 07/01/15 1508 07/02/15 0644   07/01/15 1209  ceFAZolin (ANCEF) 2-4  GM/100ML-% IVPB    Comments:  [name], [name]   : cabinet override      07/01/15 1209 07/02/15 0014   07/01/15 1045  vancomycin (VANCOCIN) IVPB 1000 mg/200 mL premix     1,000 mg 200 mL/hr over 60 Minutes Intravenous  Once 07/01/15 1032 07/01/15 1330   07/01/15 1045  cefTRIAXone (ROCEPHIN) 1 g in dextrose 5 % 50 mL IVPB     1 g 100 mL/hr over 30 Minutes Intravenous  Once 07/01/15 1032 07/01/15 1118       Intake/Output Summary (Last 24 hours) at 07/05/15 1718 Last data filed at 07/05/15 1500  Gross per 24 hour  Intake   2720 ml  Output   1925 ml  Net    795 ml   Filed Weights   07/02/15 0500 07/03/15 0500 07/04/15 0500  Weight: 73.165 kg (161 lb 4.8 oz) 73.891 kg (162 lb 14.4 oz) 78.835 kg (173 lb 12.8 oz)    Objective: Physical Exam: Filed Vitals:   07/04/15 1826 07/04/15 2028 07/05/15 0505 07/05/15 1141  BP: 138/60 140/57 131/73 118/59  Pulse: 88 89 89 75  Temp: 99.4 F (37.4 C) 99.6 F (37.6 C) 100.2 F (37.9 C) 98.1 F (36.7 C)  TempSrc: Oral Oral Oral Oral  Resp: _0 Height:      Weight:  SpO2: 95% 100% 95% 100%    General: Appear in mild distress, no Rash; Oral Mucosa moist. Some neck pain with tenderness Cardiovascular: S1 and S2 Present, no Murmur, no JVD Respiratory: Bilateral Air entry present and faint basal Crackles, no wheezes Abdomen: Bowel Sound present, Soft and no tenderness Extremities: trace Pedal edema, no calf tenderness  Data Reviewed: CBC:  Recent Labs Lab 07/01/15 0341 07/02/15 0409 07/03/15 0943 07/04/15 0531 07/05/15 0552 07/05/15 0851  WBC 9.4 8.6 8.1 8.5 7.8  --   NEUTROABS 7.3  --  5.9 5.1 4.7  --   HGB 9.7* 8.8* 8.3* 8.4* 7.8* 7.9*  HCT 29.5* 26.1* 24.9* 24.2* 23.1* 23.5*  MCV 93.1 91.3 90.9 91.7 90.2  --   PLT 266 240 240 237 268  --    Basic Metabolic Panel:  Recent Labs Lab 07/01/15 0341 07/02/15 0409 07/03/15 0943 07/04/15 0531 07/05/15 0552  NA 143 136 139 141 140  K 4.4 4.0 4.2 4.1 4.1  CL 107  105 108 111 110  CO2 25 20* 22 22 21*  GLUCOSE 130* 139* 177* 102* 101*  BUN 30* 22* 20 21* 15  CREATININE 1.91* 1.59* 1.64* 1.31* 1.22  CALCIUM 9.4 8.4* 8.7* 8.6* 8.7*   GFR: Estimated Creatinine Clearance: 54.9 mL/min (by C-G formula based on Cr of 1.22). Liver Function Tests:  Recent Labs Lab 07/01/15 0341 07/02/15 0409 07/04/15 0531 07/05/15 0552  AST 75* 104* 216* 105*  ALT 49 49 130* 95*  ALKPHOS 155* 147* 226* 239*  BILITOT 1.4* 0.8 0.6 0.5  PROT 7.5 6.1* 5.7* 5.9*  ALBUMIN 2.9* 2.2* 1.8* 1.8*   No results for input(s): LIPASE, AMYLASE in the last 168 hours. No results for input(s): AMMONIA in the last 168 hours. Coagulation Profile: No results for input(s): INR, PROTIME in the last 168 hours. Cardiac Enzymes:  Recent Labs Lab 07/01/15 0341 07/03/15 0943 07/05/15 0552  CKTOTAL 737* 1764* 580*   Anemia Panel:  Recent Labs  07/05/15 0851  VITAMINB12 265  FOLATE 15.7  FERRITIN 2441*  TIBC 122*  IRON 24*   Urine analysis:    Component Value Date/Time   COLORURINE YELLOW 07/01/2015 0416   APPEARANCEUR CLEAR 07/01/2015 0416   LABSPEC 1.018 07/01/2015 0416   PHURINE 5.5 07/01/2015 0416   GLUCOSEU NEGATIVE 07/01/2015 0416   HGBUR MODERATE* 07/01/2015 0416   BILIRUBINUR NEGATIVE 07/01/2015 0416   KETONESUR 15* 07/01/2015 0416   PROTEINUR 30* 07/01/2015 0416   UROBILINOGEN 0.2 10/19/2008 0657   NITRITE NEGATIVE 07/01/2015 0416   LEUKOCYTESUR NEGATIVE 07/01/2015 0416    Recent Results (from the past 240 hour(s))  Blood Culture (routine x 2)     Status: None (Preliminary result)   Collection Time: 07/01/15  3:35 AM  Result Value Ref Range Status   Specimen Description BLOOD RIGHT ARM  Final   Special Requests   Final    BOTTLES DRAWN AEROBIC AND ANAEROBIC 10CC BLUE 5CC RED   Culture NO GROWTH 4 DAYS  Final   Report Status PENDING  Incomplete  Blood Culture (routine x 2)     Status: None (Preliminary result)   Collection Time: 07/01/15  3:40 AM    Result Value Ref Range Status   Specimen Description BLOOD LEFT ARM  Final   Special Requests IN PEDIATRIC BOTTLE 3CC  Final   Culture NO GROWTH 4 DAYS  Final   Report Status PENDING  Incomplete  Urine culture     Status: None   Collection Time: 07/01/15  4:16 AM  Result Value Ref Range Status   Specimen Description URINE, RANDOM  Final   Special Requests NONE  Final   Culture NO GROWTH 1 DAY  Final   Report Status 07/02/2015 FINAL  Final  Body fluid culture     Status: None   Collection Time: 07/01/15  6:11 AM  Result Value Ref Range Status   Specimen Description FLUID SYNOVIAL LEFT KNEE  Final   Special Requests NONE  Final   Gram Stain   Final    ABUNDANT WBC PRESENT, PREDOMINANTLY PMN NO ORGANISMS SEEN    Culture NO GROWTH 3 DAYS  Final   Report Status 07/04/2015 FINAL  Final  Surgical pcr screen     Status: None   Collection Time: 07/01/15 12:18 PM  Result Value Ref Range Status   MRSA, PCR NEGATIVE NEGATIVE Final   Staphylococcus aureus NEGATIVE NEGATIVE Final    Comment:        The Xpert SA Assay (FDA approved for NASAL specimens in patients over 38 years of age), is one component of a comprehensive surveillance program.  Test performance has been validated by [facility] for patients greater than or equal to 44 year old. It is not intended to diagnose infection nor to guide or monitor treatment.   Culture, body fluid-bottle     Status: None (Preliminary result)   Collection Time: 07/01/15  1:15 PM  Result Value Ref Range Status   Specimen Description SYNOVIAL LEFT KNEE  Final   Special Requests NONE  Final   Culture NO GROWTH 4 DAYS  Final   Report Status PENDING  Incomplete  Gram stain     Status: None   Collection Time: 07/01/15  1:15 PM  Result Value Ref Range Status   Specimen Description SYNOVIAL LEFT KNEE  Final   Special Requests NONE  Final   Gram Stain   Final    ABUNDANT WBC PRESENT, PREDOMINANTLY PMN NO ORGANISMS SEEN    Report Status  07/01/2015 FINAL  Final  Culture, blood (routine x 2)     Status: None (Preliminary result)   Collection Time: 07/04/15  4:49 PM  Result Value Ref Range Status   Specimen Description BLOOD RIGHT ANTECUBITAL  Final   Special Requests BOTTLES DRAWN AEROBIC AND ANAEROBIC 5CC  Final   Culture NO GROWTH < 24 HOURS  Final   Report Status PENDING  Incomplete  Culture, blood (routine x 2)     Status: None (Preliminary result)   Collection Time: 07/04/15  4:52 PM  Result Value Ref Range Status   Specimen Description BLOOD RIGHT HAND  Final   Special Requests BOTTLES DRAWN AEROBIC AND ANAEROBIC 5CC  Final   Culture NO GROWTH < 24 HOURS  Final   Report Status PENDING  Incomplete    Studies: Mr Cervical Spine Wo Contrast  07/04/2015  CLINICAL DATA:  75 year old hypertensive male with onset of right-sided neck pain. Difficulty walking. Acute renal failure. EXAM: MRI CERVICAL SPINE WITHOUT CONTRAST TECHNIQUE: Multiplanar, multisequence MR imaging of the cervical spine was performed. No intravenous contrast was administered. COMPARISON:  None. FINDINGS: Cervical medullary junction unremarkable. Mild small vessel disease changes of the pons otherwise intracranial structures which are visualized are unremarkable. Motion artifact extends through cord without discrete cervical cord signal abnormality. Markedly abnormal appearance of bone marrow in a diffuse fashion. It is possible this is related to anemia. Infiltrative process not excluded. T2 bright sub cm areas of altered signal intensity within the right aspect  of the C4 left aspect of the C6 vertebra. Significance/ etiology indeterminate. It is possible this is related to degenerative disease. Metastatic disease would be difficult to exclude in the proper clinical setting. Although there is minimal prevertebral edema, no significant osseous edema in a distribution to suggest possibility of discitis. C2-3: Shallow disc osteophyte complex greater to the right.  Slight narrowing ventral thecal sac. Mild bilateral foraminal narrowing. C3-4: Broad-based disc osteophyte complex greater to left. Slight cord flattening greater on left. Moderate bilateral foraminal narrowing. C4-5: Broad-based disc osteophyte complex slightly greater to left. Slight cord flattening greater on left. Moderate bilateral foraminal narrowing. C5-6: Broad-based disc osteophyte complex. Narrowing ventral thecal sac and slight cord contact. Moderate bilateral foraminal narrowing. C6-7: Broad-based disc osteophyte complex greater to the right. Narrowing ventral thecal sac greater on the right. Mild to moderate bilateral foraminal narrowing greater on the right. C7-T1: Facet degenerative changes. Broad-based disc osteophyte complex. Mild narrowing ventral thecal sac. Moderate foraminal narrowing greater on the left. T1-2: Schmorl's node deformity. Mild bulge. Mild narrowing ventral thecal sac. Minimal foraminal narrowing. IMPRESSION: Markedly abnormal appearance of bone marrow in a diffuse fashion. It is possible this is related to anemia. Infiltrative process not excluded. T2 bright sub cm areas of altered signal intensity within the right aspect of the C4 left aspect of the C6 vertebra. Significance/ etiology indeterminate. It is possible this is related to degenerative disease. Metastatic disease would be difficult to exclude in the proper clinical setting. Multilevel cervical spondylotic changes throughout the cervical spine and upper thoracic spine contribute to various degrees spinal stenosis or foraminal narrowing as detailed above. Electronically Signed   By: Genia Del M.D.   On: 07/04/2015 18:41    Scheduled Meds: . aspirin EC  81 mg Oral Daily  . carvedilol  3.125 mg Oral BID WC  . feeding supplement (ENSURE ENLIVE)  237 mL Oral TID BM  . ferrous sulfate  325 mg Oral TID WC  . levofloxacin  750 mg Oral Daily   Continuous Infusions: . sodium chloride 125 mL/hr at 07/05/15 0100    PRN Meds: acetaminophen **OR** acetaminophen, cyclobenzaprine, HYDROcodone-acetaminophen, traZODone  Time spent: 30 minutes  Author: Berle Mull, MD [facility] Pager: [phone] 07/05/2015 5:18 PM  If 7PM-7AM, please contact night-coverage at [email or web address], password [facility]

## 2015-07-05 NOTE — Progress Notes (Signed)
Physical Therapy Treatment Patient Details Name: Luis Chen MRN: [id number] DOB: March 30, 1940 Today's Date: 07/05/2015    History of Present Illness pt s/p irrigation and debridement left knee arthroscopy 07/01/15 with painful joints left clavilce, ankle and right ankle, with PMHx of CAD, cardiomyopathy, HTN, and kidney disease     PT Comments    Pt with excellent progression with all mobility now that pain in joints has decreased. Pt still agreeable to D/C to dgtr's home initially until back to independent function. Pt educated for gait normalization, DME use and HEP with mobility encouraged daily. Will continue to follow.   Follow Up Recommendations  Home health PT;Supervision for mobility/OOB     Equipment Recommendations       Recommendations for Other Services       Precautions / Restrictions Precautions Precautions: Fall Restrictions LLE Weight Bearing: Weight bearing as tolerated    Mobility  Bed Mobility Overal bed mobility: Modified Independent Bed Mobility: Supine to Sit           General bed mobility comments: pt able to pivot supine to sit without assist with use of bil UE today to rise  Transfers Overall transfer level: Needs assistance     Sit to Stand: Supervision         General transfer comment: pt able to rise from bed and toilet with cues for hand placement and assist for IV but no physical assist  Ambulation/Gait Ambulation/Gait assistance: Min guard Ambulation Distance (Feet): 175 Feet Assistive device: Rolling walker (2 wheeled) Gait Pattern/deviations: Step-through pattern;Decreased stride length;Trunk flexed;Decreased dorsiflexion - right;Decreased dorsiflexion - left   Gait velocity interpretation: Below normal speed for age/gender General Gait Details: cues for posture, increased stride, knee extension and dorsiflexion   Stairs Stairs: Yes Stairs assistance: Supervision Stair Management: Step to pattern;Sideways;One rail  Right Number of Stairs: 7 General stair comments: cues for sequence with support of bil UE on rail   Wheelchair Mobility    Modified Rankin (Stroke Patients Only)       Balance                                    Cognition Arousal/Alertness: Awake/alert Behavior During Therapy: WFL for tasks assessed/performed Overall Cognitive Status: Within Functional Limits for tasks assessed                      Exercises General Exercises - Lower Extremity Heel Slides: AROM;10 reps;Supine;Both    General Comments        Pertinent Vitals/Pain Pain Score: 6  Pain Location: bil ankles Pain Descriptors / Indicators: Aching;Sore Pain Intervention(s): Limited activity within patient's tolerance;Monitored during session;Premedicated before session;Repositioned    Home Living                      Prior Function            PT Goals (current goals can now be found in the care plan section) Progress towards PT goals: Progressing toward goals;Goals met and updated - see care plan    Frequency       PT Plan Current plan remains appropriate    Co-evaluation             End of Session Equipment Utilized During Treatment: Gait belt Activity Tolerance: Patient tolerated treatment well Patient left: in chair;with call bell/phone within reach     Time: [phone]  PT Time Calculation (min) (ACUTE ONLY): 35 min  Charges:  $Gait Training: 8-22 mins $Therapeutic Activity: 8-22 mins                    G Codes:      Melford Aase 2015/07/07, 9:39 AM Elwyn Reach, [place]

## 2015-07-05 NOTE — Progress Notes (Signed)
Occupational Therapy Treatment Patient Details Name: Luis Chen MRN: [id number] DOB: 03-04-1941 Today's Date: 07/05/2015    History of present illness pt s/p irrigation and debridement left knee arthroscopy 07/01/15 with painful joints left clavilce, ankle and right ankle, with PMHx of CAD, cardiomyopathy, HTN, and kidney disease    OT comments  Pt doing well with min guard assist provided for functional transfers with walker. Discussed use of reacher and pt practiced with it as well as functional mobility in the room. Discussed tub DME options. Will follow.   Follow Up Recommendations  No OT follow up;Supervision/Assistance - 24 hour    Equipment Recommendations   (3in1 delivered. pt to decide on tubbench)    Recommendations for Other Services      Precautions / Restrictions Precautions Precautions: Fall Restrictions LLE Weight Bearing: Weight bearing as tolerated       Mobility Bed Mobility            General bed mobility comments: in chair.   Transfers Overall transfer level: Needs assistance Equipment used: Rolling walker (2 wheeled) Transfers: Sit to/from Stand Sit to Stand: Min guard         General transfer comment: cues for hand placement especially with stand to sit. min guard for safety from lower recliner.     Balance                                   ADL                           Toilet Transfer: Min guard;RW             General ADL Comments: pt with some initial unsteadiness with sit to stand but able to complete with min guard assist for safety but OT did hold walker for stability. Cues for hand placement with stand to sit as pt tending to plop slightly with trying to reach for edge of chair with one hand. Practiced in room functional mobility and at times, pt tending to push walker slightly too far in front of him and noted to be more alittle unsteady when doing so. Discussed how to adjust 3in1 and also use of  3in1 in tub versus a tubbench. Pt would like to think about options of DME versus sponge bathing. Practiced with reacher for LB dressing but pt able to reach down and don/doff L sock without AE today. Pt states he would not have been able to do this yesterday. he plans to mostly use reacher for picking up objects, etc but was interested in how to use it for dressing also. Pt doing well with no complaint of lightheadedness.       Vision                     Perception     Praxis      Cognition   Behavior During Therapy: WFL for tasks assessed/performed Overall Cognitive Status: Within Functional Limits for tasks assessed                       Extremity/Trunk Assessment               Exercises General Exercises - Lower Extremity Heel Slides: AROM;10 reps;Supine;Both   Shoulder Instructions       General Comments      Pertinent Vitals/  Pain       Pain Assessment: 0-10 Pain Score: 6  Pain Location: L knee Pain Descriptors / Indicators: Sore Pain Intervention(s): Monitored during session  Home Living                                          Prior Functioning/Environment              Frequency       Progress Toward Goals  OT Goals(current goals can now be found in the care plan section)  Progress towards OT goals: Progressing toward goals (added LB dressing goal and functional mobility goal.)     Plan Discharge plan remains appropriate    Co-evaluation                 End of Session Equipment Utilized During Treatment: Rolling walker   Activity Tolerance Patient tolerated treatment well   Patient Left in chair;with call bell/phone within reach   Nurse Communication          Time: 0950-1020 OT Time Calculation (min): 30 min  Charges: OT General Charges $OT Visit: 1 Procedure OT Treatments $Self Care/Home Management : 8-22 mins $Therapeutic Activity: 8-22 mins  Pauline Aus [place] 07/05/2015,  10:26 AM

## 2015-07-05 NOTE — Progress Notes (Signed)
Patient ID: IANMICHAEL ORGEL, male   DOB: [id number], 75 y.o.   MRN: [id number]     Subjective:  Patient reports pain as mild.  Patient reports improvement today and states the the only other places besides his knee that hurt are the cervical spine and the bilateral ankles right greater than left.  Patient states that he has several teeth that often give him problems.  Objective:   VITALS:   Filed Vitals:   07/04/15 1826 07/04/15 2028 07/05/15 0505 07/05/15 1141  BP: 138/60 140/57 131/73 118/59  Pulse: 88 89 89 75  Temp: 99.4 F (37.4 C) 99.6 F (37.6 C) 100.2 F (37.9 C) 98.1 F (36.7 C)  TempSrc: Oral Oral Oral Oral  Resp: 18 19 18 15   Height:      Weight:      SpO2: 95% 100% 95% 100%    ABD soft Sensation intact distally Dorsiflexion/Plantar flexion intact Incision: dressing C/D/I and no drainage  Left knee 10-100 degree motion Wounds good with moderate swelling Left ankle mild tenderness to palpation no swelling no sign of infection Right ankle moderate tenderness to palpation and mild swelling there does not seem to be any infection.  Good ROM bilaterally with no pain to passive ROM of either ankle or foot Exam of the C-Spine shows limited ROM with pain in all plains denies any numbness or pain radiating in the upper ext.  Lab Results  Component Value Date   WBC 7.8 07/05/2015   HGB 7.9* 07/05/2015   HCT 23.5* 07/05/2015   MCV 90.2 07/05/2015   PLT 268 07/05/2015     Assessment/Plan: 4 Days Post-Op   Principal Problem:   Pyogenic arthritis of left knee joint ([place]) Active Problems:   Essential hypertension, benign   Coronary atherosclerosis   Anemia   Polyarthropathy   Acute renal failure superimposed on stage 3 chronic kidney disease (HCC)   Non-traumatic rhabdomyolysis   Atelectasis, bilateral   H/O NICM (nonischemic cardiomyopathy) ([place])   Diastolic dysfunction with chronic heart failure ([place])   Advance diet Up with therapy  Continue plan per  medicine No sign of infection in the knees or the ankles  Could be inflammatory arthritis    Remonia Richter 07/05/2015, 1:39 PM   Edmonia Lynch MD [phone]

## 2015-07-06 LAB — CULTURE, BODY FLUID-BOTTLE

## 2015-07-06 LAB — BASIC METABOLIC PANEL
Anion gap: 9 (ref 5–15)
BUN: 21 mg/dL — AB (ref 6–20)
CALCIUM: 8.8 mg/dL — AB (ref 8.9–10.3)
CO2: 22 mmol/L (ref 22–32)
Chloride: 109 mmol/L (ref 101–111)
Creatinine, Ser: 1.18 mg/dL (ref 0.61–1.24)
GFR calc Af Amer: >60 mL/min (ref >60)
GFR, EST NON AFRICAN AMERICAN: 58 mL/min — AB (ref >60)
GLUCOSE: 103 mg/dL — AB (ref 65–99)
Potassium: 4.4 mmol/L (ref 3.5–5.1)
Sodium: 140 mmol/L (ref 135–145)

## 2015-07-06 LAB — CBC
HCT: 24.5 % — ABNORMAL LOW (ref 39.0–52.0)
Hemoglobin: 8.3 g/dL — ABNORMAL LOW (ref 13.0–17.0)
MCH: 31.3 pg (ref 26.0–34.0)
MCHC: 33.9 g/dL (ref 30.0–36.0)
MCV: 92.5 fL (ref 78.0–100.0)
PLATELETS: 311 10*3/uL (ref 150–400)
RBC: 2.65 MIL/uL — ABNORMAL LOW (ref 4.22–5.81)
RDW: 14.3 % (ref 11.5–15.5)
WBC: 6.9 10*3/uL (ref 4.0–10.5)

## 2015-07-06 LAB — CULTURE, BLOOD (ROUTINE X 2)
Culture: NO GROWTH
Culture: NO GROWTH

## 2015-07-06 LAB — CULTURE, BODY FLUID W GRAM STAIN -BOTTLE: Culture: NO GROWTH

## 2015-07-06 MED ORDER — LEVOFLOXACIN 750 MG PO TABS: 750.0000 mg | ORAL_TABLET | Freq: Every day | ORAL | Status: DC

## 2015-07-06 MED ORDER — CARVEDILOL 3.125 MG PO TABS: 3.1250 mg | ORAL_TABLET | Freq: Two times a day (BID) | ORAL | Status: DC

## 2015-07-06 MED ORDER — FUROSEMIDE 40 MG PO TABS: 20.0000 mg | ORAL_TABLET | Freq: Every day | ORAL | Status: DC | PRN

## 2015-07-06 MED ORDER — ENSURE ENLIVE PO LIQD: 237.0000 mL | Freq: Three times a day (TID) | ORAL | Status: DC

## 2015-07-06 MED ORDER — TRAMADOL HCL 50 MG PO TABS: 50.0000 mg | ORAL_TABLET | Freq: Four times a day (QID) | ORAL | Status: AC | PRN

## 2015-07-06 NOTE — Progress Notes (Signed)
Luis Chen to be D/C'd Home per MD order.  Discussed with the patient and all questions fully answered.  VSS, Skin clean, dry and intact without evidence of skin break down, no evidence of skin tears noted. IV catheter discontinued intact. Site without signs and symptoms of complications. Dressing and pressure applied.  An After Visit Summary was printed and given to the patient. Patient received prescription.  D/c education completed with patient/family including follow up instructions, medication list, d/c activities limitations if indicated, with other d/c instructions as indicated by MD - patient able to verbalize understanding, all questions fully answered.   Patient instructed to return to ED, call 911, or call MD for any changes in condition.   Patient escorted via [place], and D/C home via private auto with his daughter Luis Chen.  Burgess Estelle 07/06/2015 1:46 PM

## 2015-07-06 NOTE — Progress Notes (Signed)
Occupational Therapy Treatment Patient Details Name: Luis Chen MRN: [id number] DOB: 01/31/1941 Today's Date: 07/06/2015    History of present illness pt s/p irrigation and debridement left knee arthroscopy 07/01/15 with painful joints left clavilce, ankle and right ankle, with PMHx of CAD, cardiomyopathy, HTN, and kidney disease    OT comments  Pt. Able to complete tub transfer with S.  Moving well and reports no difficulty during UB/LB ADLS this am.  Plans to stay with his dtr. At d/c.  Eager with hopes to d/c later today.  Clear from acute OT standpoint.  Will alert OTR/L to sign off.    Follow Up Recommendations  No OT follow up;Supervision/Assistance - 24 hour    Equipment Recommendations       Recommendations for Other Services      Precautions / Restrictions Precautions Precautions: Fall Restrictions LLE Weight Bearing: Weight bearing as tolerated       Mobility Bed Mobility               General bed mobility comments: in chair.   Transfers Overall transfer level: Needs assistance Equipment used: Rolling walker (2 wheeled) Transfers: Sit to/from Stand;Stand Pivot Transfers Sit to Stand: Min guard Stand pivot transfers: Supervision       General transfer comment: cues for hand placement especially with stand to sit. min guard for safety from lower recliner.     Balance                                   ADL Overall ADL's : Needs assistance/impaired     Grooming: Wash/dry hands;Wash/dry face;Supervision/safety;Standing                   Toilet Transfer: Supervision/safety;Ambulation;RW;Regular Toilet;Grab bars Toilet Transfer Details (indicate cue type and reason): stood for urination with RW pushed to the side and held onto grab bars with LUE Toileting- Clothing Manipulation and Hygiene: Supervision/safety;Sit to/from stand   Tub/ Shower Transfer: Tub transfer;Supervision/safety;Grab bars Tub/Shower Transfer Details  (indicate cue type and reason): able to side step over simulated tub with L faucet and has diagonal grab bar (dtrs house set up).  educated on need to have 2nd person stabalize walker when side stepping in/out of tub Functional mobility during ADLs: Supervision/safety General ADL Comments: much improved from previous sessions. reports he completed entire bath in standing at sink this am.  demonstrating great safety awareness.  moves slow but is able to compelete functional mobility at S level.        Vision                     Perception     Praxis      Cognition   Behavior During Therapy: WFL for tasks assessed/performed Overall Cognitive Status: Within Functional Limits for tasks assessed                       Extremity/Trunk Assessment               Exercises     Shoulder Instructions       General Comments      Pertinent Vitals/ Pain       Pain Assessment: No/denies pain  Home Living                                            Prior Functioning/Environment              Frequency Min 2X/week     Progress Toward Goals  OT Goals(current goals can now be found in the care plan section)  Progress towards OT goals: Goals met/education completed, patient discharged from OT     Plan Discharge plan remains appropriate    Co-evaluation                 End of Session Equipment Utilized During Treatment: Gait belt;Rolling walker   Activity Tolerance Patient tolerated treatment well   Patient Left in chair;with call bell/phone within reach   Nurse Communication          Time: 1111-1129 OT Time Calculation (min): 18 min  Charges: OT General Charges $OT Visit: 1 Procedure OT Treatments $Self Care/Home Management : 8-22 mins  Morris, Jennifer Lorraine, COTA/L 07/06/2015, 11:37 AM    

## 2015-07-07 DIAGNOSIS — N189 Chronic kidney disease, unspecified: Secondary | ICD-10-CM | POA: Diagnosis not present

## 2015-07-07 DIAGNOSIS — I130 Hypertensive heart and chronic kidney disease with heart failure and stage 1 through stage 4 chronic kidney disease, or unspecified chronic kidney disease: Secondary | ICD-10-CM | POA: Diagnosis not present

## 2015-07-07 DIAGNOSIS — I5022 Chronic systolic (congestive) heart failure: Secondary | ICD-10-CM | POA: Diagnosis not present

## 2015-07-07 DIAGNOSIS — Z4789 Encounter for other orthopedic aftercare: Secondary | ICD-10-CM | POA: Diagnosis not present

## 2015-07-07 DIAGNOSIS — M00862 Arthritis due to other bacteria, left knee: Secondary | ICD-10-CM | POA: Diagnosis not present

## 2015-07-07 DIAGNOSIS — M150 Primary generalized (osteo)arthritis: Secondary | ICD-10-CM | POA: Diagnosis not present

## 2015-07-08 LAB — KAPPA/LAMBDA LIGHT CHAINS
KAPPA FREE LGHT CHN: 73.84 mg/L — AB (ref 3.30–19.40)
KAPPA, LAMDA LIGHT CHAIN RATIO: 2.22 — AB (ref 0.26–1.65)
LAMDA FREE LIGHT CHAINS: 33.26 mg/L — AB (ref 5.71–26.30)

## 2015-07-08 LAB — PROTEIN ELECTROPHORESIS, SERUM
A/G RATIO SPE: 0.6 — AB (ref 0.7–1.7)
ALBUMIN ELP: 1.9 g/dL — AB (ref 2.9–4.4)
Alpha-1-Globulin: 0.5 g/dL — ABNORMAL HIGH (ref 0.0–0.4)
Alpha-2-Globulin: 0.9 g/dL (ref 0.4–1.0)
Beta Globulin: 0.9 g/dL (ref 0.7–1.3)
GLOBULIN, TOTAL: 3.3 g/dL (ref 2.2–3.9)
Gamma Globulin: 1 g/dL (ref 0.4–1.8)
M-Spike, %: 0.2 g/dL — ABNORMAL HIGH
TOTAL PROTEIN ELP: 5.2 g/dL — AB (ref 6.0–8.5)

## 2015-07-08 NOTE — Discharge Summary (Signed)
[facility] Discharge Summary   Patient: Luis Chen [id number]   PCP: Leamon Arnt, MD DOB: Jul 13, 1940   Date of admission: 07/01/2015   Date of discharge: 07/06/2015     Discharge Diagnoses:  Principal Problem:   Pyogenic arthritis of left knee joint ([place]) Active Problems:   Essential hypertension, benign   Coronary atherosclerosis   Anemia   Polyarthropathy   Acute renal failure superimposed on stage 3 chronic kidney disease (HCC)   Non-traumatic rhabdomyolysis   Atelectasis, bilateral   H/O NICM (nonischemic cardiomyopathy) (HCC)   Diastolic dysfunction with chronic heart failure ([place])  Recommendations for Outpatient Follow-up:  1. Please follow-up with PCP in one week, please get a referral to rheumatology for further workup of polyarthritis. 2. Please follow-up with hematology as soon as possible regarding infiltrative bone marrow disorder as well as cervical spinal lesion.  3. Pending SPEP and free light chain ratio  Follow-up Information    Follow up with [facility].   Why:  They will contact you to schedule home therapy visits.   Contact information:   [address] [place] [address] [phone]       Follow up with ANDY,CAMILLE L, MD. Schedule an appointment as soon as possible for a visit in 1 week.   Specialty:  Family Medicine   Why:  also get a referral for rheumatology   Contact information:   [address] [place] [address] [phone]       Follow up with [facility], MD. Schedule an appointment as soon as possible for a visit in 1 week.   Specialty:  Oncology   Contact information:   [address] [place] [address] [phone]      Diet recommendation: cardiac and diabetic diet   Activity: The patient is advised to gradually reintroduce usual activities.  Discharge Condition: good  History of present illness: As per the H and P dictated on admission, "Luis Chen is a 75  y.o. male with a past medical history that includes CAD, kidney disease, since emergency department with the chief complaint left knee swelling. Initial evaluation reveals a left knee effusion and positive joint fluid from aspirate as well as acute on chronic kidney disease  Information is obtained from the patient and the chart. He reports about 6 days ago he developed mild left-sided neck stiffness that was intermittent. In addition he developed significant left knee swelling mild swelling in his right ankle and tenderness to his left wrist. He reports the pain in his right ankle worsened to the point it was difficult for him to ambulate. He denies any erythema to any of these joints. Describes the pain as constant aching worse with movement. Right ankles also tender to touch. He denies chest pain palpitations shortness of breath dizziness syncope or near-syncope. He denies fever chills nausea vomiting diarrhea. His report intermittent headache. He is had episodes similar in the past of multiple joint swelling denies ever being diagnosed with gout. He denies any other chronic joint problems. He ambulates independently and no recent falls or traumas.  In the emergency department he's afebrile hemodynamically stable and not hypoxic. He is provided with IV fluids to mycin and Rocephin."  Hospital Course:   Summary of his active problems in the hospital is as following. 1. Pyogenic arthritis of left knee joint (HCC)  Polyarthritis.  Status post irrigation and debridement of the left knee arthroscopy, no excision. Patient has been started on vancomycin  and ceftriaxone since admission. MRSA PCR is negative. Synovial fluid culture is negative. Blood culture is also negative. HIV negative, RPR negative ESR 125, CRP 33.2.Rheumatoid factor elevated Borrelia  serology negative. Orthopedic was also consulted to help assist getting a repeat sample arthrocentesis, although they feel that the patient does not  have any significant evidence of joint effusion elsewhere.  Discussed with Infectious disease,due to persistent fever over phone, recommended to get MRI neck as well as further workup related to inflammatory arthropathy,infectious diseases felt patient's multiple joint involvement with negative blood culture and no improvement on ceftriaxone suggest less likely infectious etiology and more likely inflammatory etiology.  Repeat blood culture also remained negative for 48 hours. With this patient was transitioned to oral Levaquin to complete a course of antibiotics.   2. Neck pain. Prior history of abnormal free light chain ratio. Abnormal MRI MRI of the neck is showing evidence of infiltrative disease in the bone marrow. Due to neck pain as well as fever with suspicions of CNS infection a CT scan of the head was also performed. CT scan does not show any evidence of lytic lesion. Patient has prior history of abnormal light chain ratio. at the time of the discharge free light chain ratio as well as SPEP is pending.  3. Abnormal T2 signal on C4 and C6. MRI also shows possible spots concerning for metastasis. Patient will require further outpatient workup. Patient recommended to follow-up with oncology.  4. Inflammatory arthritis. Recheck ESR continues to remain elevated. Negative CCP, parvovirus B19, ANA, hepatitis panel.  5. Bilateral atelectasis. Recommended to use incentive spirometry.  Transition to Levaquin. no fever identified.   6. Coronary artery disease. Prior history of nonischemic cardiomyopathy. Recent echocardiogram in 2013 shows normal ejection fraction with grade 1 diastolic dysfunction. Continue beta blocker, aspirin. Hold statin in the setting of elevated CPK.  7. Acute on chronic kidney disease stage III. Renal function mildly worsened on admission likely due to dehydration. Improving rapidly. CK has been elevated on admission and has further worsened, improved  with IV hydration. Discontinuing lovastatin temporarily and can be resumed once patient's follows up with PCP. Also discontinuing losartan temporarily since blood pressure was well-controlled and renal function worsen. Lasix is also changed to when necessary due to worsening renal function  8. Anemia of chronic disease. Iron supplementation  SPEP pending. Urine output adequate.  9. Nontraumatic rhabdomyolysis. Mild elevation of the CPK. improved with hydration   Hold statin.   All other chronic medical condition were stable during the hospitalization.  Patient was seen by physical therapy, who recommended home health, but patient refused. Patient was provided 3 in 1 commode.  On the day of the discharge the patient's pain was well-controlled and no fever identified, and no other acute medical condition were reported by patient. the patient was felt safe to be discharge at home with family.  Procedures and Results:  Irrigation and debridement without excision of left knee arthroscopy   Consultations:  Orthopedics.  Phone consultation with infectious disease   DISCHARGE MEDICATION: Discharge Medication List as of 07/06/2015 12:59 PM    START taking these medications   Details  feeding supplement, ENSURE ENLIVE, (ENSURE ENLIVE) LIQD Take 237 mLs by mouth 3 (three) times daily between meals., Starting 07/06/2015, Until Discontinued, Normal    levofloxacin (LEVAQUIN) 750 MG tablet Take 1 tablet (750 mg total) by mouth daily., Starting 07/07/2015, Until Discontinued, Normal      CONTINUE these medications which have CHANGED   Details  carvedilol (COREG) 3.125 MG tablet Take 1 tablet (3.125 mg total) by mouth 2 (two) times daily with a meal., Starting 07/06/2015, Until Discontinued, Normal    furosemide (LASIX) 40 MG tablet Take 0.5 tablets (20 mg total) by mouth daily as needed for fluid or edema (for weight gain of more than 3 Lbs in 1 day)., Starting 07/06/2015, Until Discontinued,  Normal    traMADol (ULTRAM) 50 MG tablet Take 1 tablet (50 mg total) by mouth every 6 (six) hours as needed for severe pain., Starting 07/06/2015, Until Tue 07/16/15, Normal      CONTINUE these medications which have NOT CHANGED   Details  aspirin 81 MG tablet Take 81 mg by mouth daily.  , Until Discontinued, Historical Med    cyclobenzaprine (FLEXERIL) 10 MG tablet Take 10 mg by mouth every 8 (eight) hours as needed for muscle spasms. , Starting 06/26/2015, Until Sat 07/06/15, Historical Med    FERROUS GLUCONATE IRON PO Take 240 mg by mouth daily., Until Discontinued, Historical Med      STOP taking these medications     losartan (COZAAR) 25 MG tablet      pravastatin (PRAVACHOL) 40 MG tablet        Allergies  Allergen Reactions  . Ace Inhibitors     REACTION: swelling in face August 2010   Discharge Instructions    Diet - low sodium heart healthy    Complete by:  As directed      Discharge instructions    Complete by:  As directed   It is important that you read following instructions as well as go over your medication list with RN to help you understand your care after this hospitalization.  Discharge Instructions: Please follow-up with PCP in one week  Please request your primary care physician to go over all Hospital Tests and Procedure/Radiological results at the follow up,  Please get all Hospital records sent to your PCP by signing hospital release before you go home.   Do not drive, operating heavy machinery, perform activities at heights, swimming or participation in water activities or provide baby sitting services while your are on Pain, Sleep and Anxiety Medications; until you have been seen by Primary Care Physician or a Neurologist and advised to do so again. Do not take more than prescribed Pain, Sleep and Anxiety Medications. You were cared for by a hospitalist during your hospital stay. If you have any questions about your discharge medications or the care you  received while you were in the hospital after you are discharged, you can call the unit and ask to speak with the hospitalist on call if the hospitalist that took care of you is not available.  Once you are discharged, your primary care physician will handle any further medical issues. Please note that NO REFILLS for any discharge medications will be authorized once you are discharged, as it is imperative that you return to your primary care physician (or establish a relationship with a primary care physician if you do not have one) for your aftercare needs so that they can reassess your need for medications and monitor your lab values. You Must read complete instructions/literature along with all the possible adverse reactions/side effects for all the Medicines you take and that have been prescribed to you. Take any new Medicines after you have completely understood and accept all the possible adverse reactions/side effects. Wear Seat belts while driving. If you have smoked or chewed Tobacco in the last 2 yrs please  stop smoking and/or stop any Recreational drug use.     Increase activity slowly    Complete by:  As directed           Discharge Exam: Filed Weights   07/02/15 0500 07/03/15 0500 07/04/15 0500  Weight: 73.165 kg (161 lb 4.8 oz) 73.891 kg (162 lb 14.4 oz) 78.835 kg (173 lb 12.8 oz)   Filed Vitals:   07/05/15 1956 07/06/15 0435  BP: 138/92 146/71  Pulse: 87 84  Temp: 99.8 F (37.7 C) 99.1 F (37.3 C)  Resp: 16 16   General: Appear in no distress, no Rash; Oral Mucosa moist. Cardiovascular: S1 and S2 Present, no Murmur, no JVD Respiratory: Bilateral Air entry present and Clear to Auscultation, no Crackles, no wheezes Abdomen: Bowel Sound present, Soft and no tenderness Extremities: no Pedal edema, no calf tenderness Neurology: Grossly no focal neuro deficit.  The results of significant diagnostics from this hospitalization (including imaging, microbiology, ancillary and  laboratory) are listed below for reference.    Significant Diagnostic Studies: Dg Chest 2 View  07/01/2015  CLINICAL DATA:  Fever EXAM: CHEST  2 VIEW COMPARISON:  10/20/2008 FINDINGS: Chronic mild cardiomegaly and stable aortic tortuosity. Chronic mild biapical pleural thickening. There is no edema, consolidation, effusion, or pneumothorax. No acute osseous finding IMPRESSION: Stable.  No evidence of active disease. Electronically Signed   By: Monte Fantasia M.D.   On: 07/01/2015 04:16   Dg Lumbar Spine 2-3 Views  07/02/2015  CLINICAL DATA:  Limited range of motion with low back pain, initial encounter EXAM: LUMBAR SPINE - 3 VIEW COMPARISON:  None. FINDINGS: Five lumbar type vertebral bodies are well visualized. Disc space narrowing is noted at L3-4, L4-5 and L5-S1. Osteophytic changes are noted throughout the lumbar spine. No anterolisthesis is seen. No soft tissue abnormality is noted. IMPRESSION: Degenerative change without acute abnormality. Electronically Signed   By: Inez Catalina M.D.   On: 07/02/2015 14:53   Dg Ankle Complete Right  07/01/2015  CLINICAL DATA:  Atraumatic right ankle swelling for 2 days. EXAM: RIGHT ANKLE - COMPLETE 3+ VIEW COMPARISON:  None. FINDINGS: Periarticular soft tissue swelling best noted posteriorly, without detectable joint effusion. No fracture, subluxation, or erosion. There is a lucency in the lateral talar dome with surrounding sclerosis. Mild tibiotalar osteoarthritic spurring. IMPRESSION: 1. Soft tissue swelling without acute osseous finding. 2. Lateral talar osteochondral lesion. Electronically Signed   By: Monte Fantasia M.D.   On: 07/01/2015 04:15   Ct Head Wo Contrast  07/04/2015  CLINICAL DATA:  Fever EXAM: CT HEAD WITHOUT CONTRAST TECHNIQUE: Contiguous axial images were obtained from the base of the skull through the vertex without intravenous contrast. COMPARISON:  None. FINDINGS: No evidence of parenchymal hemorrhage or extra-axial fluid collection. No  mass lesion, mass effect, or midline shift. No CT evidence of acute infarction. Subcortical white matter and periventricular small vessel ischemic changes. Cerebral volume is within normal limits.  No ventriculomegaly. The visualized paranasal sinuses are essentially clear. The mastoid air cells are unopacified. No evidence of calvarial fracture. IMPRESSION: No evidence of acute intracranial abnormality. Mild small vessel ischemic changes. Electronically Signed   By: Julian Hy M.D.   On: 07/04/2015 14:42   Mr Cervical Spine Wo Contrast  07/04/2015  CLINICAL DATA:  75 year old hypertensive male with onset of right-sided neck pain. Difficulty walking. Acute renal failure. EXAM: MRI CERVICAL SPINE WITHOUT CONTRAST TECHNIQUE: Multiplanar, multisequence MR imaging of the cervical spine was performed. No intravenous contrast was administered. COMPARISON:  None. FINDINGS: Cervical medullary junction unremarkable. Mild small vessel disease changes of the pons otherwise intracranial structures which are visualized are unremarkable. Motion artifact extends through cord without discrete cervical cord signal abnormality. Markedly abnormal appearance of bone marrow in a diffuse fashion. It is possible this is related to anemia. Infiltrative process not excluded. T2 bright sub cm areas of altered signal intensity within the right aspect of the C4 left aspect of the C6 vertebra. Significance/ etiology indeterminate. It is possible this is related to degenerative disease. Metastatic disease would be difficult to exclude in the proper clinical setting. Although there is minimal prevertebral edema, no significant osseous edema in a distribution to suggest possibility of discitis. C2-3: Shallow disc osteophyte complex greater to the right. Slight narrowing ventral thecal sac. Mild bilateral foraminal narrowing. C3-4: Broad-based disc osteophyte complex greater to left. Slight cord flattening greater on left. Moderate  bilateral foraminal narrowing. C4-5: Broad-based disc osteophyte complex slightly greater to left. Slight cord flattening greater on left. Moderate bilateral foraminal narrowing. C5-6: Broad-based disc osteophyte complex. Narrowing ventral thecal sac and slight cord contact. Moderate bilateral foraminal narrowing. C6-7: Broad-based disc osteophyte complex greater to the right. Narrowing ventral thecal sac greater on the right. Mild to moderate bilateral foraminal narrowing greater on the right. C7-T1: Facet degenerative changes. Broad-based disc osteophyte complex. Mild narrowing ventral thecal sac. Moderate foraminal narrowing greater on the left. T1-2: Schmorl's node deformity. Mild bulge. Mild narrowing ventral thecal sac. Minimal foraminal narrowing. IMPRESSION: Markedly abnormal appearance of bone marrow in a diffuse fashion. It is possible this is related to anemia. Infiltrative process not excluded. T2 bright sub cm areas of altered signal intensity within the right aspect of the C4 left aspect of the C6 vertebra. Significance/ etiology indeterminate. It is possible this is related to degenerative disease. Metastatic disease would be difficult to exclude in the proper clinical setting. Multilevel cervical spondylotic changes throughout the cervical spine and upper thoracic spine contribute to various degrees spinal stenosis or foraminal narrowing as detailed above. Electronically Signed   By: Genia Del M.D.   On: 07/04/2015 18:41   Dg Chest Port 1 View  07/03/2015  CLINICAL DATA:  Fever and shortness of Breath EXAM: PORTABLE CHEST 1 VIEW COMPARISON:  07/01/2015 FINDINGS: Cardiac shadow is stable with changes suggestive of left atrial enlargement. The lungs are well aerated with a minimal bibasilar atelectasis. No focal infiltrate or sizable effusion is seen. Prominent vessel on end is noted in the left mid lung. No acute bony abnormality is seen. IMPRESSION: Mild bibasilar atelectasis. Electronically  Signed   By: Inez Catalina M.D.   On: 07/03/2015 09:35   Dg Shoulder Left  07/02/2015  CLINICAL DATA:  75 year old male with limited left shoulder range of motion and pain EXAM: LEFT SHOULDER - 2+ VIEW COMPARISON:  Prior chest x-rays 07/01/2015 and 10/20/2008 FINDINGS: No evidence of fracture or dislocation. Degenerative osteoarthritis is present in the acromioclavicular joint. No lytic or blastic osseous lesion. Calcified left upper lobe pulmonary nodules, likely old granulomas. No significant interval change compared to prior chest radiographs. IMPRESSION: Acromioclavicular joint degenerative osteoarthritis. Otherwise, no acute fracture, malalignment or evidence of bony lesion. Electronically Signed   By: Jacqulynn Cadet M.D.   On: 07/02/2015 14:54   Dg Knee Complete 4 Views Left  07/01/2015  CLINICAL DATA:  Atraumatic left knee pain and swelling for 2 days EXAM: LEFT KNEE - COMPLETE 4+ VIEW COMPARISON:  None. FINDINGS: Suspected joint effusion, but limited by obliquity. There is no  fracture, erosion, or subluxation. Knee osteoarthritis with advanced medial compartment narrowing. Chondrocalcinosis best seen at the lateral meniscus. IMPRESSION: 1. Probable joint effusion.  No acute osseous finding. 2. Knee osteoarthritis and chondrocalcinosis. Electronically Signed   By: Monte Fantasia M.D.   On: 07/01/2015 04:17    Microbiology: Recent Results (from the past 240 hour(s))  Blood Culture (routine x 2)     Status: None   Collection Time: 07/01/15  3:35 AM  Result Value Ref Range Status   Specimen Description BLOOD RIGHT ARM  Final   Special Requests   Final    BOTTLES DRAWN AEROBIC AND ANAEROBIC 10CC BLUE 5CC RED   Culture NO GROWTH 5 DAYS  Final   Report Status 07/06/2015 FINAL  Final  Blood Culture (routine x 2)     Status: None   Collection Time: 07/01/15  3:40 AM  Result Value Ref Range Status   Specimen Description BLOOD LEFT ARM  Final   Special Requests IN PEDIATRIC BOTTLE 3CC  Final     Culture NO GROWTH 5 DAYS  Final   Report Status 07/06/2015 FINAL  Final  Urine culture     Status: None   Collection Time: 07/01/15  4:16 AM  Result Value Ref Range Status   Specimen Description URINE, RANDOM  Final   Special Requests NONE  Final   Culture NO GROWTH 1 DAY  Final   Report Status 07/02/2015 FINAL  Final  Body fluid culture     Status: None   Collection Time: 07/01/15  6:11 AM  Result Value Ref Range Status   Specimen Description FLUID SYNOVIAL LEFT KNEE  Final   Special Requests NONE  Final   Gram Stain   Final    ABUNDANT WBC PRESENT, PREDOMINANTLY PMN NO ORGANISMS SEEN    Culture NO GROWTH 3 DAYS  Final   Report Status 07/04/2015 FINAL  Final  Surgical pcr screen     Status: None   Collection Time: 07/01/15 12:18 PM  Result Value Ref Range Status   MRSA, PCR NEGATIVE NEGATIVE Final   Staphylococcus aureus NEGATIVE NEGATIVE Final    Comment:        The Xpert SA Assay (FDA approved for NASAL specimens in patients over 31 years of age), is one component of a comprehensive surveillance program.  Test performance has been validated by [facility] for patients greater than or equal to 84 year old. It is not intended to diagnose infection nor to guide or monitor treatment.   Culture, body fluid-bottle     Status: None   Collection Time: 07/01/15  1:15 PM  Result Value Ref Range Status   Specimen Description SYNOVIAL LEFT KNEE  Final   Special Requests NONE  Final   Culture NO GROWTH 5 DAYS  Final   Report Status 07/06/2015 FINAL  Final  Gram stain     Status: None   Collection Time: 07/01/15  1:15 PM  Result Value Ref Range Status   Specimen Description SYNOVIAL LEFT KNEE  Final   Special Requests NONE  Final   Gram Stain   Final    ABUNDANT WBC PRESENT, PREDOMINANTLY PMN NO ORGANISMS SEEN    Report Status 07/01/2015 FINAL  Final  Culture, blood (routine x 2)     Status: None (Preliminary result)   Collection Time: 07/04/15  4:49 PM  Result Value  Ref Range Status   Specimen Description BLOOD RIGHT ANTECUBITAL  Final   Special Requests BOTTLES DRAWN AEROBIC AND ANAEROBIC  5CC  Final   Culture NO GROWTH 3 DAYS  Final   Report Status PENDING  Incomplete  Culture, blood (routine x 2)     Status: None (Preliminary result)   Collection Time: 07/04/15  4:52 PM  Result Value Ref Range Status   Specimen Description BLOOD RIGHT HAND  Final   Special Requests BOTTLES DRAWN AEROBIC AND ANAEROBIC 5CC  Final   Culture NO GROWTH 3 DAYS  Final   Report Status PENDING  Incomplete     Labs: CBC:  Recent Labs Lab 07/02/15 0409 07/03/15 0943 07/04/15 0531 07/05/15 0552 07/05/15 0851 07/06/15 0348  WBC 8.6 8.1 8.5 7.8  --  6.9  NEUTROABS  --  5.9 5.1 4.7  --   --   HGB 8.8* 8.3* 8.4* 7.8* 7.9* 8.3*  HCT 26.1* 24.9* 24.2* 23.1* 23.5* 24.5*  MCV 91.3 90.9 91.7 90.2  --  92.5  PLT 240 240 237 268  --  794   Basic Metabolic Panel:  Recent Labs Lab 07/02/15 0409 07/03/15 0943 07/04/15 0531 07/05/15 0552 07/06/15 0348  NA 136 139 141 140 140  K 4.0 4.2 4.1 4.1 4.4  CL 105 108 111 110 109  CO2 20* 22 22 21* 22  GLUCOSE 139* 177* 102* 101* 103*  BUN 22* 20 21* 15 21*  CREATININE 1.59* 1.64* 1.31* 1.22 1.18  CALCIUM 8.4* 8.7* 8.6* 8.7* 8.8*   Liver Function Tests:  Recent Labs Lab 07/02/15 0409 07/04/15 0531 07/05/15 0552  AST 104* 216* 105*  ALT 49 130* 95*  ALKPHOS 147* 226* 239*  BILITOT 0.8 0.6 0.5  PROT 6.1* 5.7* 5.9*  ALBUMIN 2.2* 1.8* 1.8*   No results for input(s): LIPASE, AMYLASE in the last 168 hours. No results for input(s): AMMONIA in the last 168 hours. Cardiac Enzymes:  Recent Labs Lab 07/03/15 0943 07/05/15 0552  CKTOTAL 1764* 580*   Time spent: 30 minutes  Signed:  Berle Mull  [facility] 07/06/2015 , 7:19 AM

## 2015-07-09 DIAGNOSIS — M150 Primary generalized (osteo)arthritis: Secondary | ICD-10-CM | POA: Diagnosis not present

## 2015-07-09 DIAGNOSIS — Z4789 Encounter for other orthopedic aftercare: Secondary | ICD-10-CM | POA: Diagnosis not present

## 2015-07-09 DIAGNOSIS — I5022 Chronic systolic (congestive) heart failure: Secondary | ICD-10-CM | POA: Diagnosis not present

## 2015-07-09 DIAGNOSIS — I130 Hypertensive heart and chronic kidney disease with heart failure and stage 1 through stage 4 chronic kidney disease, or unspecified chronic kidney disease: Secondary | ICD-10-CM | POA: Diagnosis not present

## 2015-07-09 DIAGNOSIS — N189 Chronic kidney disease, unspecified: Secondary | ICD-10-CM | POA: Diagnosis not present

## 2015-07-09 DIAGNOSIS — M00862 Arthritis due to other bacteria, left knee: Secondary | ICD-10-CM | POA: Diagnosis not present

## 2015-07-09 LAB — CULTURE, BLOOD (ROUTINE X 2)
CULTURE: NO GROWTH
Culture: NO GROWTH

## 2015-07-11 DIAGNOSIS — I5022 Chronic systolic (congestive) heart failure: Secondary | ICD-10-CM | POA: Diagnosis not present

## 2015-07-11 DIAGNOSIS — Z4789 Encounter for other orthopedic aftercare: Secondary | ICD-10-CM | POA: Diagnosis not present

## 2015-07-11 DIAGNOSIS — M150 Primary generalized (osteo)arthritis: Secondary | ICD-10-CM | POA: Diagnosis not present

## 2015-07-11 DIAGNOSIS — I130 Hypertensive heart and chronic kidney disease with heart failure and stage 1 through stage 4 chronic kidney disease, or unspecified chronic kidney disease: Secondary | ICD-10-CM | POA: Diagnosis not present

## 2015-07-11 DIAGNOSIS — N189 Chronic kidney disease, unspecified: Secondary | ICD-10-CM | POA: Diagnosis not present

## 2015-07-11 DIAGNOSIS — M00862 Arthritis due to other bacteria, left knee: Secondary | ICD-10-CM | POA: Diagnosis not present

## 2015-07-12 DIAGNOSIS — Z4789 Encounter for other orthopedic aftercare: Secondary | ICD-10-CM | POA: Diagnosis not present

## 2015-07-12 DIAGNOSIS — N189 Chronic kidney disease, unspecified: Secondary | ICD-10-CM | POA: Diagnosis not present

## 2015-07-12 DIAGNOSIS — I130 Hypertensive heart and chronic kidney disease with heart failure and stage 1 through stage 4 chronic kidney disease, or unspecified chronic kidney disease: Secondary | ICD-10-CM | POA: Diagnosis not present

## 2015-07-12 DIAGNOSIS — M150 Primary generalized (osteo)arthritis: Secondary | ICD-10-CM | POA: Diagnosis not present

## 2015-07-12 DIAGNOSIS — M00862 Arthritis due to other bacteria, left knee: Secondary | ICD-10-CM | POA: Diagnosis not present

## 2015-07-12 DIAGNOSIS — I5022 Chronic systolic (congestive) heart failure: Secondary | ICD-10-CM | POA: Diagnosis not present

## 2015-07-15 DIAGNOSIS — M150 Primary generalized (osteo)arthritis: Secondary | ICD-10-CM | POA: Diagnosis not present

## 2015-07-15 DIAGNOSIS — I5022 Chronic systolic (congestive) heart failure: Secondary | ICD-10-CM | POA: Diagnosis not present

## 2015-07-15 DIAGNOSIS — M00862 Arthritis due to other bacteria, left knee: Secondary | ICD-10-CM | POA: Diagnosis not present

## 2015-07-15 DIAGNOSIS — Z4789 Encounter for other orthopedic aftercare: Secondary | ICD-10-CM | POA: Diagnosis not present

## 2015-07-15 DIAGNOSIS — I130 Hypertensive heart and chronic kidney disease with heart failure and stage 1 through stage 4 chronic kidney disease, or unspecified chronic kidney disease: Secondary | ICD-10-CM | POA: Diagnosis not present

## 2015-07-15 DIAGNOSIS — N189 Chronic kidney disease, unspecified: Secondary | ICD-10-CM | POA: Diagnosis not present

## 2015-07-16 ENCOUNTER — Telehealth: Payer: Self-pay | Admitting: Oncology

## 2015-07-16 NOTE — Telephone Encounter (Signed)
lvm for pt with June appt.Marland KitchenMarland KitchenMarland Kitchen

## 2015-07-17 DIAGNOSIS — I5022 Chronic systolic (congestive) heart failure: Secondary | ICD-10-CM | POA: Diagnosis not present

## 2015-07-17 DIAGNOSIS — M00862 Arthritis due to other bacteria, left knee: Secondary | ICD-10-CM | POA: Diagnosis not present

## 2015-07-17 DIAGNOSIS — I130 Hypertensive heart and chronic kidney disease with heart failure and stage 1 through stage 4 chronic kidney disease, or unspecified chronic kidney disease: Secondary | ICD-10-CM | POA: Diagnosis not present

## 2015-07-17 DIAGNOSIS — N189 Chronic kidney disease, unspecified: Secondary | ICD-10-CM | POA: Diagnosis not present

## 2015-07-17 DIAGNOSIS — M150 Primary generalized (osteo)arthritis: Secondary | ICD-10-CM | POA: Diagnosis not present

## 2015-07-17 DIAGNOSIS — Z4789 Encounter for other orthopedic aftercare: Secondary | ICD-10-CM | POA: Diagnosis not present

## 2015-07-18 DIAGNOSIS — N183 Chronic kidney disease, stage 3 (moderate): Secondary | ICD-10-CM | POA: Diagnosis not present

## 2015-07-18 DIAGNOSIS — I129 Hypertensive chronic kidney disease with stage 1 through stage 4 chronic kidney disease, or unspecified chronic kidney disease: Secondary | ICD-10-CM | POA: Diagnosis not present

## 2015-07-18 DIAGNOSIS — I429 Cardiomyopathy, unspecified: Secondary | ICD-10-CM | POA: Diagnosis not present

## 2015-07-18 DIAGNOSIS — D638 Anemia in other chronic diseases classified elsewhere: Secondary | ICD-10-CM | POA: Diagnosis not present

## 2015-07-18 DIAGNOSIS — E611 Iron deficiency: Secondary | ICD-10-CM | POA: Diagnosis not present

## 2015-07-18 DIAGNOSIS — D472 Monoclonal gammopathy: Secondary | ICD-10-CM | POA: Diagnosis not present

## 2015-07-19 DIAGNOSIS — R945 Abnormal results of liver function studies: Secondary | ICD-10-CM | POA: Diagnosis not present

## 2015-07-19 DIAGNOSIS — N189 Chronic kidney disease, unspecified: Secondary | ICD-10-CM | POA: Diagnosis not present

## 2015-07-19 DIAGNOSIS — M6282 Rhabdomyolysis: Secondary | ICD-10-CM | POA: Diagnosis not present

## 2015-07-19 DIAGNOSIS — N179 Acute kidney failure, unspecified: Secondary | ICD-10-CM | POA: Diagnosis not present

## 2015-07-19 DIAGNOSIS — D472 Monoclonal gammopathy: Secondary | ICD-10-CM | POA: Diagnosis not present

## 2015-07-19 DIAGNOSIS — M2550 Pain in unspecified joint: Secondary | ICD-10-CM | POA: Diagnosis not present

## 2015-07-22 DIAGNOSIS — M009 Pyogenic arthritis, unspecified: Secondary | ICD-10-CM | POA: Diagnosis not present

## 2015-08-07 ENCOUNTER — Other Ambulatory Visit: Payer: Self-pay | Admitting: Oncology

## 2015-08-07 DIAGNOSIS — D649 Anemia, unspecified: Secondary | ICD-10-CM

## 2015-08-08 ENCOUNTER — Telehealth: Payer: Self-pay | Admitting: Oncology

## 2015-08-08 ENCOUNTER — Ambulatory Visit (HOSPITAL_BASED_OUTPATIENT_CLINIC_OR_DEPARTMENT_OTHER): Payer: Medicare Other | Admitting: Oncology

## 2015-08-08 ENCOUNTER — Other Ambulatory Visit (HOSPITAL_BASED_OUTPATIENT_CLINIC_OR_DEPARTMENT_OTHER): Payer: Medicare Other

## 2015-08-08 VITALS — BP 117/60 | HR 92 | Temp 98.2°F | Resp 17 | Ht 71.0 in | Wt 143.9 lb

## 2015-08-08 DIAGNOSIS — D472 Monoclonal gammopathy: Secondary | ICD-10-CM | POA: Diagnosis not present

## 2015-08-08 DIAGNOSIS — D649 Anemia, unspecified: Secondary | ICD-10-CM

## 2015-08-08 DIAGNOSIS — N189 Chronic kidney disease, unspecified: Secondary | ICD-10-CM | POA: Diagnosis not present

## 2015-08-08 DIAGNOSIS — D631 Anemia in chronic kidney disease: Secondary | ICD-10-CM | POA: Diagnosis not present

## 2015-08-08 DIAGNOSIS — D509 Iron deficiency anemia, unspecified: Secondary | ICD-10-CM

## 2015-08-08 LAB — CBC WITH DIFFERENTIAL/PLATELET
BASO%: 0.8 % (ref 0.0–2.0)
BASOS ABS: 0 10*3/uL (ref 0.0–0.1)
EOS%: 6.1 % (ref 0.0–7.0)
Eosinophils Absolute: 0.4 10*3/uL (ref 0.0–0.5)
HEMATOCRIT: 25.9 % — AB (ref 38.4–49.9)
HGB: 8.6 g/dL — ABNORMAL LOW (ref 13.0–17.1)
LYMPH#: 2.3 10*3/uL (ref 0.9–3.3)
LYMPH%: 36.5 % (ref 14.0–49.0)
MCH: 30.6 pg (ref 27.2–33.4)
MCHC: 33.1 g/dL (ref 32.0–36.0)
MCV: 92.5 fL (ref 79.3–98.0)
MONO#: 0.5 10*3/uL (ref 0.1–0.9)
MONO%: 8.2 % (ref 0.0–14.0)
NEUT#: 3.1 10*3/uL (ref 1.5–6.5)
NEUT%: 48.4 % (ref 39.0–75.0)
PLATELETS: 437 10*3/uL — AB (ref 140–400)
RBC: 2.8 10*6/uL — ABNORMAL LOW (ref 4.20–5.82)
RDW: 14.2 % (ref 11.0–14.6)
WBC: 6.4 10*3/uL (ref 4.0–10.3)

## 2015-08-08 MED ORDER — FERROUS GLUCONATE 324 (38 FE) MG PO TABS: 324.0000 mg | ORAL_TABLET | Freq: Two times a day (BID) | ORAL | Status: DC

## 2015-08-08 NOTE — Progress Notes (Signed)
Hematology and Oncology Follow Up Visit  DIETER Chen [id number] [birth year] 75 y.o. 08/08/2015 3:31 PM  CC: [name], MD  [name], M.D.  [name], MD, [facility]    Principle Diagnosis: This is a 75 year old gentleman with a monoclonal protein IgG subtype, represents an monoclonal gammopathy of unknown significance.  No evidence of any plasma cell myeloma.  Secondary diagnosis: Anemia of renal disease as well as anemia of iron deficiency.  Current therapy: Observation and surveillance.  Interim History: [name] presents today for a followup visit. He was last seen in April 2013 for monoclonal gammopathy as well as mild anemia.He has failed to show up in the last 4 years for his follow-up appointments. He has also renal insufficiency and have been receiving growth factor support to boost his hemoglobin. He was recently hospitalized for left knee pain and arthritis. He had multiple imaging studies including MRI of the cervical spine which showed some diffuse bone marrow signal abnormality but no clear-cut bone lesions. He was also started on iron supplements which she has not been taking recently. He was discharged on 07/04/2015 and his hemoglobin was 8.3 at that time.  Since his discharge, he has been feeling relatively well. He had not reported any specific symptoms of weakness, fatigue or tiredness.  He had not reported any bony pain.  Had not had any pathological fractures.  He had not had any major changes in his performance status.   He does not report any headaches, blurry vision, syncope or seizures. He does not report any fevers or chills or sweats. He does not report any cough, wheezing or hemoptysis. Does not report any nausea, vomiting or abdominal pain. He does not report any chest pain, palpitation orthopnea. He does not report any frequency urgency or hesitancy. Does not report any skeletal complaints of arthralgias or myalgias. Remaining review of systems  unremarkable.  Medications: I have reviewed the patient's current medications.  Current outpatient prescriptions:  .  aspirin 81 MG tablet, Take 81 mg by mouth daily.  , Disp: , Rfl:  .  carvedilol (COREG) 3.125 MG tablet, Take 1 tablet (3.125 mg total) by mouth 2 (two) times daily with a meal., Disp: 60 tablet, Rfl: 0 .  feeding supplement, ENSURE ENLIVE, (ENSURE ENLIVE) LIQD, Take 237 mLs by mouth 3 (three) times daily between meals., Disp: 237 mL, Rfl: 12 .  FERROUS GLUCONATE IRON PO, Take 240 mg by mouth daily., Disp: , Rfl:  .  furosemide (LASIX) 40 MG tablet, Take 0.5 tablets (20 mg total) by mouth daily as needed for fluid or edema (for weight gain of more than 3 Lbs in 1 day)., Disp: 30 tablet, Rfl: 0 .  levofloxacin (LEVAQUIN) 750 MG tablet, Take 1 tablet (750 mg total) by mouth daily., Disp: 4 tablet, Rfl: 0 .  losartan (COZAAR) 25 MG tablet, Take 1 tablet by mouth daily., Disp: , Rfl:  .  pravastatin (PRAVACHOL) 80 MG tablet, Take 80 mg by mouth daily., Disp: , Rfl:  .  ferrous gluconate (FERGON) 324 MG tablet, Take 1 tablet (324 mg total) by mouth 2 (two) times daily with a meal., Disp: 60 tablet, Rfl: 3  Allergies:  Allergies  Allergen Reactions  . Ace Inhibitors     REACTION: swelling in face August 2010    Past Medical History, Surgical history, Social history, and Family History were reviewed and updated.   Physical Exam: Blood pressure 117/60, pulse 92, temperature 98.2 F (36.8 C), temperature source Oral, resp.  rate 17, height _0  (1.803 m), weight 143 lb 14.4 oz (65.273 kg), SpO2 100 %. ECOG: 0 General appearance: alert awake gentleman without distress. Head: Normocephalic, without obvious abnormality Neck: no adenopathy Lymph nodes: Cervical, supraclavicular, and axillary nodes normal. Heart:regular rate and rhythm, S1, S2 normal, no murmur, Pai, rub or gallop Lung:chest clear, no wheezing, rales, normal symmetric air entry Abdomin: soft, non-tender,  without masses or organomegaly EXT:no erythema, induration, or nodules   Lab Results: Lab Results  Component Value Date   WBC 6.4 08/08/2015   HGB 8.6* 08/08/2015   HCT 25.9* 08/08/2015   MCV 92.5 08/08/2015   PLT 437* 08/08/2015     Chemistry      Component Value Date/Time   NA 140 07/06/2015 0348   K 4.4 07/06/2015 0348   CL 109 07/06/2015 0348   CO2 22 07/06/2015 0348   BUN 21* 07/06/2015 0348   CREATININE 1.18 07/06/2015 0348      Component Value Date/Time   CALCIUM 8.8* 07/06/2015 0348   ALKPHOS 239* 07/05/2015 0552   AST 105* 07/05/2015 0552   ALT 95* 07/05/2015 0552   BILITOT 0.5 07/05/2015 0552       Results for [name], [name] (MRN [id number]) as of 08/08/2015 15:02  Ref. Range 07/05/2015 08:51  M-SPIKE, % Latest Ref Range: Not Observed g/dL 0.2 (H)  SPE Interp. Unknown Comment  Comment Unknown Comment  Kappa free light chain Latest Ref Range: 3.30-19.40 mg/L 73.84 (H)  Lamda free light chains Latest Ref Range: 5.71-26.30 mg/L 33.26 (H)  Kappa, lamda light chain ratio Latest Ref Range: 0.26-1.65  2.22 (H)     Impression and Plan:  This is a 75 year old gentleman with the following issues:   1. Monoclonal protein IgG subtype.  His M-spike is very low detected of 0.2 g/dL. His Kappa to lambda free light chain is no different than it has been in the last 5 years. These findings represent MGUS rather than active symptomatic multiple myeloma. I recommended continued observation and repeat these protein studies in one year.  2. Anemia. Multifactorial in nature with anemia of of iron deficiency, chronic disease as well as renal insufficiency. His iron studies obtained on 07/05/2015 suggest anemia of chronic disease given his elevated ferritin and decrease TIBC. From a management standpoint, I recommended continue iron replacement for the time being and consider restarting Aranesp once his iron stores are completely normalized. If he continues to have persistent anemia  despite these interventions,  bone marrow biopsy would be the next step. 3. Abnormal diffuse bone marrow signal: This is not a specific finding to suggest malignancy. This could be related to hyperactive bone marrow in the setting of anemia. Bone marrow biopsy could be indicated if his anemia does not correct with the current measures. 4. Follow-up: Will be in 3 months to recheck his hemoglobin and iron studies.     Zola Button, MD 6/1/20173:31 PM

## 2015-08-08 NOTE — Telephone Encounter (Signed)
per of to sch pt appt-gave pt copy of avs °

## 2015-09-23 DIAGNOSIS — I10 Essential (primary) hypertension: Secondary | ICD-10-CM | POA: Diagnosis not present

## 2015-09-23 DIAGNOSIS — D472 Monoclonal gammopathy: Secondary | ICD-10-CM | POA: Diagnosis not present

## 2015-09-23 DIAGNOSIS — N183 Chronic kidney disease, stage 3 (moderate): Secondary | ICD-10-CM | POA: Diagnosis not present

## 2015-09-23 DIAGNOSIS — I428 Other cardiomyopathies: Secondary | ICD-10-CM | POA: Diagnosis not present

## 2015-09-23 DIAGNOSIS — I5020 Unspecified systolic (congestive) heart failure: Secondary | ICD-10-CM | POA: Diagnosis not present

## 2015-11-04 ENCOUNTER — Encounter: Payer: Self-pay | Admitting: Cardiology

## 2015-11-04 DIAGNOSIS — N183 Chronic kidney disease, stage 3 (moderate): Secondary | ICD-10-CM | POA: Diagnosis not present

## 2015-11-08 ENCOUNTER — Ambulatory Visit (HOSPITAL_BASED_OUTPATIENT_CLINIC_OR_DEPARTMENT_OTHER): Payer: Medicare Other | Admitting: Oncology

## 2015-11-08 ENCOUNTER — Other Ambulatory Visit (HOSPITAL_BASED_OUTPATIENT_CLINIC_OR_DEPARTMENT_OTHER): Payer: Medicare Other

## 2015-11-08 VITALS — BP 138/69 | HR 65 | Temp 97.8°F | Resp 17 | Ht 71.0 in | Wt 150.5 lb

## 2015-11-08 DIAGNOSIS — D638 Anemia in other chronic diseases classified elsewhere: Secondary | ICD-10-CM | POA: Diagnosis not present

## 2015-11-08 DIAGNOSIS — N289 Disorder of kidney and ureter, unspecified: Secondary | ICD-10-CM

## 2015-11-08 DIAGNOSIS — D509 Iron deficiency anemia, unspecified: Secondary | ICD-10-CM

## 2015-11-08 DIAGNOSIS — I129 Hypertensive chronic kidney disease with stage 1 through stage 4 chronic kidney disease, or unspecified chronic kidney disease: Secondary | ICD-10-CM | POA: Diagnosis not present

## 2015-11-08 DIAGNOSIS — N183 Chronic kidney disease, stage 3 (moderate): Secondary | ICD-10-CM | POA: Diagnosis not present

## 2015-11-08 DIAGNOSIS — D649 Anemia, unspecified: Secondary | ICD-10-CM | POA: Diagnosis not present

## 2015-11-08 DIAGNOSIS — D472 Monoclonal gammopathy: Secondary | ICD-10-CM

## 2015-11-08 DIAGNOSIS — E611 Iron deficiency: Secondary | ICD-10-CM | POA: Diagnosis not present

## 2015-11-08 DIAGNOSIS — I429 Cardiomyopathy, unspecified: Secondary | ICD-10-CM | POA: Diagnosis not present

## 2015-11-08 LAB — CBC WITH DIFFERENTIAL/PLATELET
BASO%: 0.6 % (ref 0.0–2.0)
Basophils Absolute: 0 10*3/uL (ref 0.0–0.1)
EOS ABS: 0.4 10*3/uL (ref 0.0–0.5)
EOS%: 7.5 % — ABNORMAL HIGH (ref 0.0–7.0)
HCT: 36 % — ABNORMAL LOW (ref 38.4–49.9)
HGB: 12 g/dL — ABNORMAL LOW (ref 13.0–17.1)
LYMPH%: 44.8 % (ref 14.0–49.0)
MCH: 31.5 pg (ref 27.2–33.4)
MCHC: 33.3 g/dL (ref 32.0–36.0)
MCV: 94.4 fL (ref 79.3–98.0)
MONO#: 0.4 10*3/uL (ref 0.1–0.9)
MONO%: 8.5 % (ref 0.0–14.0)
NEUT#: 1.8 10*3/uL (ref 1.5–6.5)
NEUT%: 38.6 % — AB (ref 39.0–75.0)
PLATELETS: 217 10*3/uL (ref 140–400)
RBC: 3.81 10*6/uL — AB (ref 4.20–5.82)
RDW: 14.7 % — ABNORMAL HIGH (ref 11.0–14.6)
WBC: 4.7 10*3/uL (ref 4.0–10.3)
lymph#: 2.1 10*3/uL (ref 0.9–3.3)

## 2015-11-08 LAB — COMPREHENSIVE METABOLIC PANEL
ALT: <9 U/L (ref 0–55)
ANION GAP: 8 meq/L (ref 3–11)
AST: 16 U/L (ref 5–34)
Albumin: 3.4 g/dL — ABNORMAL LOW (ref 3.5–5.0)
Alkaline Phosphatase: 113 U/L (ref 40–150)
BUN: 15.5 mg/dL (ref 7.0–26.0)
CHLORIDE: 108 meq/L (ref 98–109)
CO2: 26 meq/L (ref 22–29)
Calcium: 9.5 mg/dL (ref 8.4–10.4)
Creatinine: 1.4 mg/dL — ABNORMAL HIGH (ref 0.7–1.3)
EGFR: 58 mL/min/{1.73_m2} — AB (ref >90)
Glucose: 85 mg/dl (ref 70–140)
POTASSIUM: 4.4 meq/L (ref 3.5–5.1)
SODIUM: 142 meq/L (ref 136–145)
Total Bilirubin: 0.3 mg/dL (ref 0.20–1.20)
Total Protein: 7.7 g/dL (ref 6.4–8.3)

## 2015-11-08 LAB — IRON AND TIBC
%SAT: 24 % (ref 20–55)
IRON: 56 ug/dL (ref 42–163)
TIBC: 234 ug/dL (ref 202–409)
UIBC: 177 ug/dL (ref 117–376)

## 2015-11-08 LAB — FERRITIN: FERRITIN: 460 ng/mL — AB (ref 22–316)

## 2015-11-08 NOTE — Progress Notes (Signed)
Hematology and Oncology Follow Up Visit  Luis Chen [id number] [birth year] 75 y.o. 11/08/2015 2:45 PM  CC: [name], MD  [name], M.D.  [name], MD, [facility]    Principle Diagnosis: This is a 75 year old gentleman with a monoclonal protein IgG subtype, represents an monoclonal gammopathy of unknown significance.  No evidence of any plasma cell myeloma.  Secondary diagnosis: Anemia of renal disease as well as anemia of iron deficiency.  Current therapy: Observation and surveillance And oral iron supplements.  Interim History: Mr. Sedita presents today for a followup visit. Since the last visit, he reports no recent complaints. He remains in excellent health and shape and remains active. He continues to take oral iron supplements without any side effects. He had not reported any specific symptoms of weakness, fatigue or tiredness.  He had not reported any bony pain.  Had not had any pathological fractures.  He had not had any major changes in his performance status.    He does not report any headaches, blurry vision, syncope or seizures. He does not report any fevers or chills or sweats. He does not report any cough, wheezing or hemoptysis. Does not report any nausea, vomiting or abdominal pain. He does not report any chest pain, palpitation orthopnea. He does not report any frequency urgency or hesitancy. Does not report any skeletal complaints of arthralgias or myalgias. Remaining review of systems unremarkable.  Medications: I have reviewed the patient's current medications.  Current Outpatient Prescriptions:  .  aspirin 81 MG tablet, Take 81 mg by mouth daily.  , Disp: , Rfl:  .  carvedilol (COREG) 3.125 MG tablet, Take 1 tablet (3.125 mg total) by mouth 2 (two) times daily with a meal., Disp: 60 tablet, Rfl: 0 .  feeding supplement, ENSURE ENLIVE, (ENSURE ENLIVE) LIQD, Take 237 mLs by mouth 3 (three) times daily between meals., Disp: 237 mL, Rfl: 12 .  ferrous  gluconate (FERGON) 324 MG tablet, Take 1 tablet (324 mg total) by mouth 2 (two) times daily with a meal., Disp: 60 tablet, Rfl: 3 .  FERROUS GLUCONATE IRON PO, Take 240 mg by mouth daily., Disp: , Rfl:  .  furosemide (LASIX) 40 MG tablet, Take 0.5 tablets (20 mg total) by mouth daily as needed for fluid or edema (for weight gain of more than 3 Lbs in 1 day)., Disp: 30 tablet, Rfl: 0 .  losartan (COZAAR) 25 MG tablet, Take 1 tablet by mouth daily., Disp: , Rfl:  .  pravastatin (PRAVACHOL) 80 MG tablet, Take 80 mg by mouth daily., Disp: , Rfl:   Allergies:  Allergies  Allergen Reactions  . Ace Inhibitors     REACTION: swelling in face August 2010    Past Medical History, Surgical history, Social history, and Family History were reviewed and updated.   Physical Exam: Blood pressure 138/69, pulse 65, temperature 97.8 F (36.6 C), temperature source Oral, resp. rate 17, height 5' 11" (1.803 m), weight 150 lb 8 oz (68.3 kg), SpO2 100 %. ECOG: 0 General appearance: Well-appearing gentleman without distress. Head: Normocephalic, without obvious abnormality oral ulcers or lesions. Neck: no adenopathy Lymph nodes: Cervical, supraclavicular, and axillary nodes normal. Heart:regular rate and rhythm, S1, S2 normal, no murmur, Ching, rub or gallop Lung:chest clear, no wheezing, rales, normal symmetric air entry Abdomin: soft, non-tender, without masses or organomegaly no shifting dullness or ascites. EXT:no erythema, induration, or nodules   Lab Results: Lab Results  Component Value Date   WBC 4.7 11/08/2015   HGB  12.0 (L) 11/08/2015   HCT 36.0 (L) 11/08/2015   MCV 94.4 11/08/2015   PLT 217 11/08/2015     Chemistry      Component Value Date/Time   NA 140 07/06/2015 0348   K 4.4 07/06/2015 0348   CL 109 07/06/2015 0348   CO2 22 07/06/2015 0348   BUN 21 (H) 07/06/2015 0348   CREATININE 1.18 07/06/2015 0348      Component Value Date/Time   CALCIUM 8.8 (L) 07/06/2015 0348   ALKPHOS  239 (H) 07/05/2015 0552   AST 105 (H) 07/05/2015 0552   ALT 95 (H) 07/05/2015 0552   BILITOT 0.5 07/05/2015 0552       Results for PRAVIN, PEREZPEREZ (MRN [id number]) as of 08/08/2015 15:02  Ref. Range 07/05/2015 08:51  M-SPIKE, % Latest Ref Range: Not Observed g/dL 0.2 (H)  SPE Interp. Unknown Comment  Comment Unknown Comment  Kappa free light chain Latest Ref Range: 3.30-19.40 mg/L 73.84 (H)  Lamda free light chains Latest Ref Range: 5.71-26.30 mg/L 33.26 (H)  Kappa, lamda light chain ratio Latest Ref Range: 0.26-1.65  2.22 (H)     Impression and Plan:  This is a 75 year old gentleman with the following issues:   1. Monoclonal protein IgG subtype.  His M-spike is very low detected of 0.2 g/dL. His Kappa to lambda free light chain is no different than it has been in the last 5 years. These findings represent MGUS rather than active symptomatic multiple myeloma. The plan is to continue with active surveillance at this time and repeat protein studies in April 2018.  2. Anemia. Multifactorial in nature with anemia of of iron deficiency, chronic disease as well as renal insufficiency. He is currently on oral iron replacement and his hemoglobin is close to normal range. I recommended continue oral iron supplements and use growth factor support in the future his hemoglobin drops. 3. Abnormal diffuse bone marrow signal: This is not a specific finding to suggest malignancy. This could be related to hyperactive bone marrow in the setting of anemia. Bone marrow biopsy could be indicated if his anemia does not correct with the current measures. 4. Follow-up: Will be in April 2018 sooner if needed to.     Zola Button, MD 9/1/20172:45 PM

## 2015-12-20 DIAGNOSIS — Z23 Encounter for immunization: Secondary | ICD-10-CM | POA: Diagnosis not present

## 2015-12-20 DIAGNOSIS — I10 Essential (primary) hypertension: Secondary | ICD-10-CM | POA: Diagnosis not present

## 2015-12-20 DIAGNOSIS — J069 Acute upper respiratory infection, unspecified: Secondary | ICD-10-CM | POA: Diagnosis not present

## 2015-12-20 DIAGNOSIS — M545 Low back pain: Secondary | ICD-10-CM | POA: Diagnosis not present

## 2015-12-20 DIAGNOSIS — B9789 Other viral agents as the cause of diseases classified elsewhere: Secondary | ICD-10-CM | POA: Diagnosis not present

## 2015-12-23 DIAGNOSIS — E785 Hyperlipidemia, unspecified: Secondary | ICD-10-CM | POA: Diagnosis not present

## 2015-12-23 DIAGNOSIS — D638 Anemia in other chronic diseases classified elsewhere: Secondary | ICD-10-CM | POA: Diagnosis not present

## 2015-12-23 DIAGNOSIS — N183 Chronic kidney disease, stage 3 (moderate): Secondary | ICD-10-CM | POA: Diagnosis not present

## 2015-12-23 DIAGNOSIS — I10 Essential (primary) hypertension: Secondary | ICD-10-CM | POA: Diagnosis not present

## 2015-12-23 DIAGNOSIS — I5022 Chronic systolic (congestive) heart failure: Secondary | ICD-10-CM | POA: Diagnosis not present

## 2015-12-23 DIAGNOSIS — M170 Bilateral primary osteoarthritis of knee: Secondary | ICD-10-CM | POA: Diagnosis not present

## 2015-12-23 DIAGNOSIS — D472 Monoclonal gammopathy: Secondary | ICD-10-CM | POA: Diagnosis not present

## 2016-03-30 ENCOUNTER — Other Ambulatory Visit: Payer: Self-pay | Admitting: Oncology

## 2016-05-11 DIAGNOSIS — N183 Chronic kidney disease, stage 3 (moderate): Secondary | ICD-10-CM | POA: Diagnosis not present

## 2016-05-13 DIAGNOSIS — D638 Anemia in other chronic diseases classified elsewhere: Secondary | ICD-10-CM | POA: Diagnosis not present

## 2016-05-13 DIAGNOSIS — I129 Hypertensive chronic kidney disease with stage 1 through stage 4 chronic kidney disease, or unspecified chronic kidney disease: Secondary | ICD-10-CM | POA: Diagnosis not present

## 2016-05-13 DIAGNOSIS — E611 Iron deficiency: Secondary | ICD-10-CM | POA: Diagnosis not present

## 2016-05-13 DIAGNOSIS — D472 Monoclonal gammopathy: Secondary | ICD-10-CM | POA: Diagnosis not present

## 2016-05-13 DIAGNOSIS — N183 Chronic kidney disease, stage 3 (moderate): Secondary | ICD-10-CM | POA: Diagnosis not present

## 2016-05-13 DIAGNOSIS — I429 Cardiomyopathy, unspecified: Secondary | ICD-10-CM | POA: Diagnosis not present

## 2016-06-12 ENCOUNTER — Other Ambulatory Visit (HOSPITAL_BASED_OUTPATIENT_CLINIC_OR_DEPARTMENT_OTHER): Payer: Medicare Other

## 2016-06-12 ENCOUNTER — Ambulatory Visit (HOSPITAL_BASED_OUTPATIENT_CLINIC_OR_DEPARTMENT_OTHER): Payer: Medicare Other | Admitting: Oncology

## 2016-06-12 ENCOUNTER — Telehealth: Payer: Self-pay | Admitting: Oncology

## 2016-06-12 VITALS — BP 128/53 | HR 76 | Temp 98.3°F | Resp 18 | Ht 71.0 in | Wt 157.9 lb

## 2016-06-12 DIAGNOSIS — N189 Chronic kidney disease, unspecified: Secondary | ICD-10-CM

## 2016-06-12 DIAGNOSIS — D472 Monoclonal gammopathy: Secondary | ICD-10-CM

## 2016-06-12 DIAGNOSIS — D631 Anemia in chronic kidney disease: Secondary | ICD-10-CM

## 2016-06-12 DIAGNOSIS — D509 Iron deficiency anemia, unspecified: Secondary | ICD-10-CM

## 2016-06-12 DIAGNOSIS — D729 Disorder of white blood cells, unspecified: Secondary | ICD-10-CM

## 2016-06-12 LAB — CBC WITH DIFFERENTIAL/PLATELET
BASO%: 0.9 % (ref 0.0–2.0)
Basophils Absolute: 0 10*3/uL (ref 0.0–0.1)
EOS%: 8.2 % — ABNORMAL HIGH (ref 0.0–7.0)
Eosinophils Absolute: 0.5 10*3/uL (ref 0.0–0.5)
HCT: 34.4 % — ABNORMAL LOW (ref 38.4–49.9)
HGB: 11.8 g/dL — ABNORMAL LOW (ref 13.0–17.1)
LYMPH%: 33.4 % (ref 14.0–49.0)
MCH: 32.1 pg (ref 27.2–33.4)
MCHC: 34.2 g/dL (ref 32.0–36.0)
MCV: 93.8 fL (ref 79.3–98.0)
MONO#: 0.5 10*3/uL (ref 0.1–0.9)
MONO%: 8.2 % (ref 0.0–14.0)
NEUT#: 2.8 10*3/uL (ref 1.5–6.5)
NEUT%: 49.3 % (ref 39.0–75.0)
PLATELETS: 172 10*3/uL (ref 140–400)
RBC: 3.67 10*6/uL — AB (ref 4.20–5.82)
RDW: 15.2 % — ABNORMAL HIGH (ref 11.0–14.6)
WBC: 5.7 10*3/uL (ref 4.0–10.3)
lymph#: 1.9 10*3/uL (ref 0.9–3.3)

## 2016-06-12 LAB — COMPREHENSIVE METABOLIC PANEL
ALBUMIN: 3.4 g/dL — AB (ref 3.5–5.0)
ALK PHOS: 98 U/L (ref 40–150)
ALT: 16 U/L (ref 0–55)
ANION GAP: 7 meq/L (ref 3–11)
AST: 18 U/L (ref 5–34)
BUN: 17.9 mg/dL (ref 7.0–26.0)
CO2: 25 mEq/L (ref 22–29)
Calcium: 9.3 mg/dL (ref 8.4–10.4)
Chloride: 107 mEq/L (ref 98–109)
Creatinine: 1.2 mg/dL (ref 0.7–1.3)
EGFR: 66 mL/min/{1.73_m2} — AB (ref >90)
Glucose: 92 mg/dl (ref 70–140)
Potassium: 4.4 mEq/L (ref 3.5–5.1)
Sodium: 139 mEq/L (ref 136–145)
TOTAL PROTEIN: 7.1 g/dL (ref 6.4–8.3)
Total Bilirubin: 0.44 mg/dL (ref 0.20–1.20)

## 2016-06-12 NOTE — Telephone Encounter (Signed)
Gave patient AVS and calender per 4/6 los. 1 year f/u

## 2016-06-12 NOTE — Progress Notes (Signed)
Hematology and Oncology Follow Up Visit  Luis Chen [id number] [birth year] 76 y.o. 06/12/2016 1:39 PM  CC: [name], MD  [name], M.D.  [name], MD, [facility]    Principle Diagnosis: This is a 76 year old gentleman with a monoclonal protein IgG subtype, represents an monoclonal gammopathy of unknown significance.  No evidence of any plasma cell myeloma.  Secondary diagnosis: Anemia of renal disease as well as anemia of iron deficiency.  Current therapy: Observation and surveillance and oral iron supplements.  Interim History: Mr. Charland presents today for a followup visit. Since the last visit, he reports no changes in his health. He continues to take oral iron supplements without any side effects. He denied any hematochezia, melena or recent need for transfusions. He had not reported any specific symptoms of weakness, fatigue or tiredness.  He had not reported any bony pain.  Had not had any pathological fractures.  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   He does not report any headaches, blurry vision, syncope or seizures. He does not report any fevers or chills or sweats. He does not report any cough, wheezing or hemoptysis. Does not report any nausea, vomiting or abdominal pain. He does not report any chest pain, palpitation orthopnea. He does not report any frequency urgency or hesitancy. Does not report any skeletal complaints of arthralgias or myalgias. Remaining review of systems unremarkable.  Medications: I have reviewed the patient's current medications.  Current Outpatient Prescriptions:  .  aspirin 81 MG tablet, Take 81 mg by mouth daily.  , Disp: , Rfl:  .  carvedilol (COREG) 3.125 MG tablet, Take 1 tablet (3.125 mg total) by mouth 2 (two) times daily with a meal., Disp: 60 tablet, Rfl: 0 .  feeding supplement, ENSURE ENLIVE, (ENSURE ENLIVE) LIQD, Take 237 mLs by mouth 3 (three) times daily between meals., Disp: 237 mL, Rfl: 12 .  ferrous  gluconate (FERGON) 324 MG tablet, TAKE ONE TABLET BY MOUTH TWICE DAILY WITH A MEAL, Disp: 60 tablet, Rfl: 3 .  FERROUS GLUCONATE IRON PO, Take 240 mg by mouth daily., Disp: , Rfl:  .  furosemide (LASIX) 40 MG tablet, Take 0.5 tablets (20 mg total) by mouth daily as needed for fluid or edema (for weight gain of more than 3 Lbs in 1 day)., Disp: 30 tablet, Rfl: 0 .  losartan (COZAAR) 25 MG tablet, Take 1 tablet by mouth daily., Disp: , Rfl:  .  pravastatin (PRAVACHOL) 80 MG tablet, Take 80 mg by mouth daily., Disp: , Rfl:   Allergies:  Allergies  Allergen Reactions  . Ace Inhibitors Swelling    Facial swelling     Past Medical History, Surgical history, Social history, and Family History were reviewed and updated.   Physical Exam: Blood pressure (!) 128/53, pulse 76, temperature 98.3 F (36.8 C), temperature source Oral, resp. rate 18, height '5\' 11"'  (1.803 m), weight 157 lb 14.4 oz (71.6 kg), SpO2 99 %. ECOG: 0 General appearance: Alert, awake gentleman without distress. Head: Normocephalic, without obvious abnormality no oral thrush or ulcers. Neck: no adenopathy Lymph nodes: Cervical, supraclavicular, and axillary nodes normal. Heart:regular rate and rhythm, S1, S2 normal, no murmur, Nodarse, rub or gallop Lung:chest clear, no wheezing, rales, normal symmetric air entry Abdomin: soft, non-tender, without masses or organomegaly no rebound or guarding. EXT:no erythema, induration, or nodules   Lab Results: Lab Results  Component Value Date   WBC 5.7 06/12/2016   HGB 11.8 (L) 06/12/2016  HCT 34.4 (L) 06/12/2016   MCV 93.8 06/12/2016   PLT 172 06/12/2016     Chemistry      Component Value Date/Time   NA 142 11/08/2015 1417   K 4.4 11/08/2015 1417   CL 109 07/06/2015 0348   CO2 26 11/08/2015 1417   BUN 15.5 11/08/2015 1417   CREATININE 1.4 (H) 11/08/2015 1417      Component Value Date/Time   CALCIUM 9.5 11/08/2015 1417   ALKPHOS 113 11/08/2015 1417   AST 16 11/08/2015  1417   ALT <9 11/08/2015 1417   BILITOT 0.30 11/08/2015 1417       Results for ALEKSA, CATTERTON (MRN [id number]) as of 08/08/2015 15:02  Ref. Range 07/05/2015 08:51  M-SPIKE, % Latest Ref Range: Not Observed g/dL 0.2 (H)  SPE Interp. Unknown Comment  Comment Unknown Comment  Kappa free light chain Latest Ref Range: 3.30-19.40 mg/L 73.84 (H)  Lamda free light chains Latest Ref Range: 5.71-26.30 mg/L 33.26 (H)  Kappa, lamda light chain ratio Latest Ref Range: 0.26-1.65  2.22 (H)     Impression and Plan:  This is a 76 year old gentleman with the following issues:   1. Monoclonal protein IgG MGUS.  His M-spike is very low detected of 0.2 g/dL. His Kappa to lambda free light chain is no different than it has been in the last 5 years. The plan is to continue with active surveillance and repeat serum protein electrophoresis on an annual basis. Staging workup with skeletal survey and a bone marrow biopsy will be needed if his protein studies started to rise rapidly. This has not happened in the last 6 years.  2. Anemia. Multifactorial in nature with anemia of of iron deficiency, chronic disease as well as renal insufficiency. He is currently on oral iron replacement and his hemoglobin is close to normal range. No changes recommended at this time. I recommend continuing oral iron at this time. 3. Abnormal diffuse bone marrow signal: This is not a specific finding to suggest malignancy. This could be related to hyperactive bone marrow in the setting of anemia. His CBC does not show any acute abnormalities. 4. Follow-up: Will be in April 2019 sooner if needed to.     Zola Button, MD 4/6/20181:39 PM

## 2016-06-15 LAB — KAPPA/LAMBDA LIGHT CHAINS
Ig Kappa Free Light Chain: 51.8 mg/L — ABNORMAL HIGH (ref 3.3–19.4)
Ig Lambda Free Light Chain: 21.1 mg/L (ref 5.7–26.3)
KAPPA/LAMBDA FLC RATIO: 2.45 — AB (ref 0.26–1.65)

## 2016-06-16 LAB — MULTIPLE MYELOMA PANEL, SERUM
ALPHA 1: 0.2 g/dL (ref 0.0–0.4)
ALPHA2 GLOB SERPL ELPH-MCNC: 0.6 g/dL (ref 0.4–1.0)
Albumin SerPl Elph-Mcnc: 3.3 g/dL (ref 2.9–4.4)
Albumin/Glob SerPl: 1.1 (ref 0.7–1.7)
B-Globulin SerPl Elph-Mcnc: 0.8 g/dL (ref 0.7–1.3)
GAMMA GLOB SERPL ELPH-MCNC: 1.5 g/dL (ref 0.4–1.8)
Globulin, Total: 3.1 g/dL (ref 2.2–3.9)
IGM (IMMUNOGLOBIN M), SRM: 180 mg/dL — AB (ref 15–143)
IgA, Qn, Serum: 163 mg/dL (ref 61–437)
IgG, Qn, Serum: 1338 mg/dL (ref 700–1600)
M Protein SerPl Elph-Mcnc: 0.2 g/dL — ABNORMAL HIGH
Total Protein: 6.4 g/dL (ref 6.0–8.5)

## 2016-08-27 DIAGNOSIS — M170 Bilateral primary osteoarthritis of knee: Secondary | ICD-10-CM | POA: Diagnosis not present

## 2016-08-27 DIAGNOSIS — D472 Monoclonal gammopathy: Secondary | ICD-10-CM | POA: Diagnosis not present

## 2016-08-27 DIAGNOSIS — N183 Chronic kidney disease, stage 3 (moderate): Secondary | ICD-10-CM | POA: Diagnosis not present

## 2016-08-27 DIAGNOSIS — I10 Essential (primary) hypertension: Secondary | ICD-10-CM | POA: Diagnosis not present

## 2016-09-06 ENCOUNTER — Other Ambulatory Visit: Payer: Self-pay | Admitting: Oncology

## 2017-01-06 ENCOUNTER — Other Ambulatory Visit: Payer: Self-pay | Admitting: Oncology

## 2017-02-16 ENCOUNTER — Ambulatory Visit: Payer: Medicare Other | Admitting: Family Medicine

## 2017-02-18 ENCOUNTER — Encounter: Payer: Self-pay | Admitting: *Deleted

## 2017-02-19 ENCOUNTER — Other Ambulatory Visit: Payer: Self-pay | Admitting: *Deleted

## 2017-02-19 ENCOUNTER — Encounter: Payer: Self-pay | Admitting: *Deleted

## 2017-02-23 ENCOUNTER — Encounter: Payer: Self-pay | Admitting: Family Medicine

## 2017-02-23 ENCOUNTER — Ambulatory Visit (INDEPENDENT_AMBULATORY_CARE_PROVIDER_SITE_OTHER): Payer: Medicare Other | Admitting: Family Medicine

## 2017-02-23 VITALS — BP 142/76 | HR 88 | Temp 98.8°F | Ht 67.75 in | Wt 166.2 lb

## 2017-02-23 DIAGNOSIS — M170 Bilateral primary osteoarthritis of knee: Secondary | ICD-10-CM | POA: Diagnosis not present

## 2017-02-23 DIAGNOSIS — D472 Monoclonal gammopathy: Secondary | ICD-10-CM | POA: Diagnosis not present

## 2017-02-23 DIAGNOSIS — N183 Chronic kidney disease, stage 3 unspecified: Secondary | ICD-10-CM

## 2017-02-23 DIAGNOSIS — D638 Anemia in other chronic diseases classified elsewhere: Secondary | ICD-10-CM

## 2017-02-23 DIAGNOSIS — E782 Mixed hyperlipidemia: Secondary | ICD-10-CM | POA: Diagnosis not present

## 2017-02-23 DIAGNOSIS — I10 Essential (primary) hypertension: Secondary | ICD-10-CM

## 2017-02-23 NOTE — Progress Notes (Signed)
Subjective  CC:  Chief Complaint  Patient presents with  . Establish Care    Transfer from [place]  . Hypertension  . Chronic Kidney Disease    HPI: Luis Chen is a 76 y.o. male who presents to [facility] at [facility] today to establish care with me as a new patient. He is a former [facility] patient and is here to reestablish care with me today.   He has the following concerns or needs (chronic problem f/u):  Hypertension f/u: Control is fair . Pt reports he is doing well. taking medications as instructed, no medication side effects noted, home BP monitoring in range of 417-408X systolic over 44Y diastolic, no TIAs, no chest pain on exertion, no dyspnea on exertion, no swelling of ankles. He sees his cardiologist every 6 months. No recent heart problems. No sxs of uncompensated CHF. He denies adverse effects from his BP medications. Compliance with medication is good.   BP Readings from Last 3 Encounters:  02/23/17 (!) 142/76  06/12/16 (!) 128/53  11/08/15 138/69   Wt Readings from Last 3 Encounters:  02/23/17 166 lb 4 oz (75.4 kg)  06/12/16 157 lb 14.4 oz (71.6 kg)  11/08/15 150 lb 8 oz (68.3 kg)    Lab Results  Component Value Date   CREATININE 1.2 06/12/2016   BUN 17.9 06/12/2016   NA 139 06/12/2016   K 4.4 06/12/2016   CL 109 07/06/2015   CO2 25 06/12/2016    Hyperlipidemia f/u: due for lipid check on high dose pravachol. Tolerating well. No myalgias. Nl lfts in past.   Ckd: no sxs of volume overload, nausea, chest pain. Has anemia but energy levels are stable.   MGUS: received 80% disability status from [place]. Follows with hematology; levels have been stable.   OA - in June, we discussed worsening OA sxs on left limiting function w/o trauma, redness, swelling or injury. Having difficulty walking any significant distance due to pain. No locking or giveway. Had septic joint requiring debridement in 2017; hasn't had further f/u with ortho. He never  made the appointment but would want to see him again due to worsening pain. No systemic sxs.  We updated and reviewed the patient's past history in detail and it is documented below.  Patient Active Problem List   Diagnosis Date Noted  . H/O NICM (nonischemic cardiomyopathy) ([place]) 07/03/2015  . Nonischemic cardiomyopathy ([place]) 07/03/2015  . Pyogenic arthritis of left knee joint ([place])   . Anemia, chronic disease 07/01/2015  . Primary osteoarthritis of knees, bilateral 12/10/2014  . MGUS (monoclonal gammopathy of unknown significance) 06/30/2010  . Chronic kidney disease (CKD), stage III (moderate) ([place]) 03/24/2010  . Benign essential hypertension 10/31/2008  . Mixed hyperlipidemia 10/30/2008    Health Maintenance  Topic Date Due  . Samul Dada  12/04/2018  . INFLUENZA VACCINE  Completed  . PNA vac Low Risk Adult  Completed   Immunization History  Administered Date(s) Administered  . Influenza Split 11/03/2010  . Influenza, High Dose Seasonal PF 11/30/2011, 12/06/2013, 12/10/2014, 12/20/2015  . Influenza,inj,Quad PF,6+ Mos 12/05/2012  . Influenza-Unspecified 01/31/2017  . Pneumococcal Conjugate-13 12/06/2013  . Pneumococcal Polysaccharide-23 03/09/2008  . Tdap 12/03/2008  . Zoster 03/09/2008   Current Meds  Medication Sig  . aspirin 81 MG tablet Take 81 mg by mouth daily.    . carvedilol (COREG) 3.125 MG tablet Take 1 tablet (3.125 mg total) by mouth 2 (two) times daily with a meal.  . feeding supplement, ENSURE  ENLIVE, (ENSURE ENLIVE) LIQD Take 237 mLs by mouth 3 (three) times daily between meals.  . ferrous gluconate (FERGON) 324 MG tablet TAKE ONE TABLET BY MOUTH TWICE DAILY WITH A MEAL  . furosemide (LASIX) 40 MG tablet Take 0.5 tablets (20 mg total) by mouth daily as needed for fluid or edema (for weight gain of more than 3 Lbs in 1 day).  Marland Kitchen latanoprost (XALATAN) 0.005 % ophthalmic solution Place 1 drop into both eyes at bedtime.   Marland Kitchen losartan (COZAAR) 25 MG tablet Take 1  tablet by mouth daily.  . pravastatin (PRAVACHOL) 80 MG tablet Take 80 mg by mouth daily.    Allergies: Patient is allergic to ace inhibitors. Past Medical History Patient  has a past medical history of CAD (coronary artery disease), Cardiomyopathy, CHF (congestive heart failure) ([place]), CKD (chronic kidney disease), Hyperlipidemia, and Hypertension. Past Surgical History Patient  has a past surgical history that includes Knee arthroscopy (Left, 07/01/2015). Family History: Patient family history includes Arthritis in his mother; Diabetes in his mother; Heart disease in his brother and mother; Hyperlipidemia in his mother; Hypertension in his mother; Hypothyroidism in his daughter. Social History:  Patient  reports that  has never smoked. he has never used smokeless tobacco. He reports that he drinks alcohol. He reports that he does not use drugs.  Review of Systems: Constitutional: negative for fever or malaise Ophthalmic: negative for photophobia, double vision or loss of vision Cardiovascular: negative for chest pain, dyspnea on exertion, or new LE swelling Respiratory: negative for SOB or persistent cough Gastrointestinal: negative for abdominal pain, change in bowel habits or melena Genitourinary: negative for dysuria or gross hematuria Musculoskeletal: negative for new gait disturbance or muscular weakness, + knee pain with walking Integumentary: negative for new or persistent rashes  Neurological: negative for TIA or stroke symptoms Psychiatric: negative for SI or delusions Allergic/Immunologic: negative for hives  Patient Care Team    Relationship Specialty Notifications Start End  Leamon Arnt, MD PCP - General Family Medicine  07/01/15   Renette Butters, MD Attending Physician Orthopedic Surgery  02/23/17   [name], MD Consulting Physician Nephrology  02/23/17   Caryl Pina, MD  Hematology  02/23/17   [name], MD Consulting Physician Cardiology   02/23/17     Objective  Vitals: BP (!) 142/76 (BP Location: Left Arm, Patient Position: Sitting, Cuff Size: Normal)   Pulse 88   Temp 98.8 F (37.1 C) (Oral)   Ht 5' 7.75" (1.721 m)   Wt 166 lb 4 oz (75.4 kg)   SpO2 95%   BMI 25.47 kg/m  General:  Well developed, well nourished, no acute distress  Psych:  Alert and oriented,normal mood and affect HEENT:  Normocephalic, atraumatic, non-icteric sclera, PERRL, oropharynx is without mass or exudate, supple neck without adenopathy, mass or thyromegaly, poor dentition Cardiovascular:  RRR with S3 gallop vs fixed splitting S2, No rub or murmur, nondisplaced PMI Respiratory:  Good breath sounds bilaterally, CTAB with normal respiratory effort Gastrointestinal: normal bowel sounds, soft, non-tender, no noted masses. No HSM MSK: no deformities, contusions. Joints are without erythema or swelling Skin:  Warm, no rashes or suspicious lesions noted Neurologic:    Mental status is normal. Gross motor and sensory exams are normal. Normal gait  Assessment  1. Benign essential hypertension   2. Mixed hyperlipidemia   3. Chronic kidney disease (CKD), stage III (moderate) (HCC)   4. Anemia, chronic disease   5. MGUS (monoclonal gammopathy of unknown  significance)   6. Primary osteoarthritis of knees, bilateral      Plan   HTN is better controlled by report but mildly elevated today. Pt will restart more frequent home monitoring and let me know if remains elevated. Would increase carvedilol if needed.   Check lipids on statin and lfts. Diet is good.   CKD - q 6 month monitoring. Stable clinically  OA - refer back to ortho for reevaluation and mgt. ? Steroid injection or other treatments. I defer given h/o septic joint. Handicap placard for 1 year until improved.  Follow up:  6 months for HTN recheck. 3 months for AWV  Commons side effects, risks, benefits, and alternatives for medications and treatment plan prescribed today were discussed,  and the patient expressed understanding of the given instructions. Patient is instructed to call or message via MyChart if he/she has any questions or concerns regarding our treatment plan. No barriers to understanding were identified. We discussed Red Flag symptoms and signs in detail. Patient expressed understanding regarding what to do in case of urgent or emergency type symptoms.   Medication list was reconciled, printed and provided to the patient in AVS. Patient instructions and summary information was reviewed with the patient as documented in the AVS. This note was prepared with assistance of Dragon voice recognition software. Occasional wrong-word or sound-a-like substitutions may have occurred due to the inherent limitations of voice recognition software  Orders Placed This Encounter  Procedures  . CBC with Differential/Platelet  . Comprehensive metabolic panel  . Lipid panel  . PTH, intact (no Ca)  . Phosphorus  . Ambulatory referral to Orthopedic Surgery   No orders of the defined types were placed in this encounter.

## 2017-02-23 NOTE — Patient Instructions (Signed)
It was so good seeing you again! Thank you for establishing with my new practice and allowing me to continue caring for you. It means a lot to me.   Please schedule a follow up appointment with me in 6 months for a blood pressure. We will call you with information regarding your referral appointment. Dr. Edmonia Lynch - orthopedics.   Medicare recommends an Annual Wellness Visit for all patients. Please schedule this to be done with our Nurse Educator, Maudie Mercury. This is an informative "talk" visit; it's goals are to ensure that your health care needs are being met and to give you education regarding avoiding falls, ensuring you are not suffering from depression or problems with memory or thinking, and to educate you on Advance Care Planning. It helps me take good care of you!

## 2017-02-24 LAB — LIPID PANEL
CHOLESTEROL: 148 mg/dL (ref 0–200)
HDL: 44.4 mg/dL (ref >39.00)
LDL Cholesterol: 78 mg/dL (ref 0–99)
NONHDL: 103.73
Total CHOL/HDL Ratio: 3
Triglycerides: 127 mg/dL (ref 0.0–149.0)
VLDL: 25.4 mg/dL (ref 0.0–40.0)

## 2017-02-24 LAB — COMPREHENSIVE METABOLIC PANEL
ALBUMIN: 3.7 g/dL (ref 3.5–5.2)
ALK PHOS: 73 U/L (ref 39–117)
ALT: 10 U/L (ref 0–53)
AST: 15 U/L (ref 0–37)
BILIRUBIN TOTAL: 0.5 mg/dL (ref 0.2–1.2)
BUN: 24 mg/dL — AB (ref 6–23)
CO2: 26 mEq/L (ref 19–32)
Calcium: 8.9 mg/dL (ref 8.4–10.5)
Chloride: 105 mEq/L (ref 96–112)
Creatinine, Ser: 1.4 mg/dL (ref 0.40–1.50)
GFR: 62.95 mL/min (ref >60.00)
GLUCOSE: 98 mg/dL (ref 70–99)
Potassium: 4.3 mEq/L (ref 3.5–5.1)
Sodium: 136 mEq/L (ref 135–145)
TOTAL PROTEIN: 6.9 g/dL (ref 6.0–8.3)

## 2017-02-24 LAB — CBC WITH DIFFERENTIAL/PLATELET
BASOS PCT: 0.6 % (ref 0.0–3.0)
Basophils Absolute: 0 10*3/uL (ref 0.0–0.1)
EOS PCT: 9.6 % — AB (ref 0.0–5.0)
Eosinophils Absolute: 0.4 10*3/uL (ref 0.0–0.7)
HCT: 37.3 % — ABNORMAL LOW (ref 39.0–52.0)
HEMOGLOBIN: 12.1 g/dL — AB (ref 13.0–17.0)
LYMPHS ABS: 1.7 10*3/uL (ref 0.7–4.0)
Lymphocytes Relative: 37.7 % (ref 12.0–46.0)
MCHC: 32.5 g/dL (ref 30.0–36.0)
MCV: 97.6 fl (ref 78.0–100.0)
MONO ABS: 0.4 10*3/uL (ref 0.1–1.0)
Monocytes Relative: 9.4 % (ref 3.0–12.0)
NEUTROS ABS: 2 10*3/uL (ref 1.4–7.7)
Neutrophils Relative %: 42.7 % — ABNORMAL LOW (ref 43.0–77.0)
Platelets: 207 10*3/uL (ref 150.0–400.0)
RBC: 3.82 Mil/uL — ABNORMAL LOW (ref 4.22–5.81)
RDW: 14 % (ref 11.5–15.5)
WBC: 4.6 10*3/uL (ref 4.0–10.5)

## 2017-02-24 LAB — TIQ-NTM

## 2017-02-24 LAB — PARATHYROID HORMONE, INTACT (NO CA): PTH: 71 pg/mL — AB (ref 14–64)

## 2017-02-24 LAB — PHOSPHORUS: Phosphorus: 3.3 mg/dL (ref 2.3–4.6)

## 2017-03-22 DIAGNOSIS — M25562 Pain in left knee: Secondary | ICD-10-CM | POA: Diagnosis not present

## 2017-03-31 ENCOUNTER — Telehealth: Payer: Self-pay | Admitting: Family Medicine

## 2017-03-31 NOTE — Telephone Encounter (Signed)
Pt dropped off medical clearance form to be completed and would like a call back to let him know that this has been done or not depending Dr. Jonni Sanger can sign off on this. Placed in bin up front w/charge sheet.

## 2017-04-01 ENCOUNTER — Telehealth: Payer: Self-pay | Admitting: Emergency Medicine

## 2017-04-01 NOTE — Telephone Encounter (Signed)
Medical Clearance Forms faxed to Dentist office. Patient informed.

## 2017-05-20 ENCOUNTER — Other Ambulatory Visit: Payer: Self-pay | Admitting: Oncology

## 2017-05-31 ENCOUNTER — Telehealth: Payer: Self-pay | Admitting: Oncology

## 2017-05-31 NOTE — Telephone Encounter (Signed)
CME - moved lab/fu from 4/5 to 4/26. Left message for patient and mailed updated schedule.

## 2017-06-07 DIAGNOSIS — I129 Hypertensive chronic kidney disease with stage 1 through stage 4 chronic kidney disease, or unspecified chronic kidney disease: Secondary | ICD-10-CM | POA: Diagnosis not present

## 2017-06-07 DIAGNOSIS — D472 Monoclonal gammopathy: Secondary | ICD-10-CM | POA: Diagnosis not present

## 2017-06-07 DIAGNOSIS — N2581 Secondary hyperparathyroidism of renal origin: Secondary | ICD-10-CM | POA: Diagnosis not present

## 2017-06-07 DIAGNOSIS — N183 Chronic kidney disease, stage 3 (moderate): Secondary | ICD-10-CM | POA: Diagnosis not present

## 2017-06-07 DIAGNOSIS — I429 Cardiomyopathy, unspecified: Secondary | ICD-10-CM | POA: Diagnosis not present

## 2017-06-07 DIAGNOSIS — D631 Anemia in chronic kidney disease: Secondary | ICD-10-CM | POA: Diagnosis not present

## 2017-06-07 DIAGNOSIS — E611 Iron deficiency: Secondary | ICD-10-CM | POA: Diagnosis not present

## 2017-06-11 ENCOUNTER — Other Ambulatory Visit: Payer: Medicare Other

## 2017-06-11 ENCOUNTER — Ambulatory Visit: Payer: Medicare Other | Admitting: Oncology

## 2017-06-14 ENCOUNTER — Encounter: Payer: Self-pay | Admitting: *Deleted

## 2017-06-15 ENCOUNTER — Telehealth: Payer: Self-pay | Admitting: *Deleted

## 2017-06-15 NOTE — Telephone Encounter (Signed)
REFERRAL SENT TO SCHEDULING FROM [facility] [phone] DR. PATEL

## 2017-07-02 ENCOUNTER — Inpatient Hospital Stay (HOSPITAL_BASED_OUTPATIENT_CLINIC_OR_DEPARTMENT_OTHER): Payer: Medicare Other | Admitting: Oncology

## 2017-07-02 ENCOUNTER — Telehealth: Payer: Self-pay | Admitting: Oncology

## 2017-07-02 ENCOUNTER — Inpatient Hospital Stay: Payer: Medicare Other | Attending: Oncology

## 2017-07-02 VITALS — BP 148/65 | HR 70 | Temp 98.6°F | Resp 17 | Ht 67.75 in | Wt 163.1 lb

## 2017-07-02 DIAGNOSIS — D729 Disorder of white blood cells, unspecified: Secondary | ICD-10-CM

## 2017-07-02 DIAGNOSIS — D472 Monoclonal gammopathy: Secondary | ICD-10-CM | POA: Diagnosis not present

## 2017-07-02 DIAGNOSIS — N289 Disorder of kidney and ureter, unspecified: Secondary | ICD-10-CM | POA: Diagnosis not present

## 2017-07-02 DIAGNOSIS — D509 Iron deficiency anemia, unspecified: Secondary | ICD-10-CM | POA: Insufficient documentation

## 2017-07-02 DIAGNOSIS — Z79899 Other long term (current) drug therapy: Secondary | ICD-10-CM

## 2017-07-02 LAB — CBC WITH DIFFERENTIAL/PLATELET
Basophils Absolute: 0 10*3/uL (ref 0.0–0.1)
Basophils Relative: 0 %
EOS ABS: 0.4 10*3/uL (ref 0.0–0.5)
Eosinophils Relative: 7 %
HCT: 34.5 % — ABNORMAL LOW (ref 38.4–49.9)
HEMOGLOBIN: 11.6 g/dL — AB (ref 13.0–17.1)
LYMPHS ABS: 1.8 10*3/uL (ref 0.9–3.3)
Lymphocytes Relative: 35 %
MCH: 32.1 pg (ref 27.2–33.4)
MCHC: 33.6 g/dL (ref 32.0–36.0)
MCV: 95.6 fL (ref 79.3–98.0)
Monocytes Absolute: 0.3 10*3/uL (ref 0.1–0.9)
Monocytes Relative: 7 %
NEUTROS ABS: 2.6 10*3/uL (ref 1.5–6.5)
NEUTROS PCT: 51 %
Platelets: 181 10*3/uL (ref 140–400)
RBC: 3.61 MIL/uL — AB (ref 4.20–5.82)
RDW: 14.4 % (ref 11.0–14.6)
WBC: 5.1 10*3/uL (ref 4.0–10.3)

## 2017-07-02 LAB — COMPREHENSIVE METABOLIC PANEL
ALT: 16 U/L (ref 0–55)
AST: 23 U/L (ref 5–34)
Albumin: 3.3 g/dL — ABNORMAL LOW (ref 3.5–5.0)
Alkaline Phosphatase: 96 U/L (ref 40–150)
Anion gap: 9 (ref 3–11)
BUN: 18 mg/dL (ref 7–26)
CHLORIDE: 110 mmol/L — AB (ref 98–109)
CO2: 21 mmol/L — ABNORMAL LOW (ref 22–29)
CREATININE: 1.31 mg/dL — AB (ref 0.70–1.30)
Calcium: 9.1 mg/dL (ref 8.4–10.4)
GFR, EST AFRICAN AMERICAN: 58 mL/min — AB (ref >60)
GFR, EST NON AFRICAN AMERICAN: 50 mL/min — AB (ref >60)
Glucose, Bld: 109 mg/dL (ref 70–140)
POTASSIUM: 4.1 mmol/L (ref 3.5–5.1)
Sodium: 140 mmol/L (ref 136–145)
Total Bilirubin: 0.3 mg/dL (ref 0.2–1.2)
Total Protein: 6.9 g/dL (ref 6.4–8.3)

## 2017-07-02 LAB — IRON AND TIBC
IRON: 50 ug/dL (ref 42–163)
Saturation Ratios: 26 % — ABNORMAL LOW (ref 42–163)
TIBC: 194 ug/dL — AB (ref 202–409)
UIBC: 144 ug/dL

## 2017-07-02 LAB — FERRITIN: FERRITIN: 459 ng/mL — AB (ref 22–316)

## 2017-07-02 NOTE — Telephone Encounter (Signed)
Scheduled appt per 4/26 los - Gave patient AVS and calender per los.

## 2017-07-02 NOTE — Progress Notes (Signed)
Hematology and Oncology Follow Up Visit  Luis Chen [id number] [birth year] 77 y.o. 07/02/2017 8:56 AM  CC: [name], MD  [name], M.D.  [name], MD, [facility]    Principle Diagnosis:  77 year old man with:  1.IgG MGUS diagnosed in 2012 without any evidence of symptomatic multiple myeloma.  His M spike was around 0.2 g/dL with normal IgG level.  2.  Anemia of renal disease as well as iron deficiency.    Current therapy: Oral iron replacement periodically.  Interim History: Luis Chen is here for a follow-up.  Since the last visit, he reports no major changes in his health.  He continues to feel well and attends to activities of daily living.  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  He reports no pathological fractures, bone pain or recurrent infections.   He does not report any headaches, blurry vision, syncope or seizures. He does not report any fevers or chills or sweats. He does not report any cough, wheezing or hemoptysis. Does not report any nausea, vomiting or abdominal pain. He does not report any chest pain, palpitation orthopnea. He does not report any frequency urgency or hesitancy. Does not report any arthralgias or myalgias.  Denies any skin rashes or lesions.  Remaining review of systems is negative.  Medications: I have reviewed the patient's current medications.  Current Outpatient Medications:  .  aspirin 81 MG tablet, Take 81 mg by mouth daily.  , Disp: , Rfl:  .  carvedilol (COREG) 3.125 MG tablet, Take 1 tablet (3.125 mg total) by mouth 2 (two) times daily with a meal., Disp: 60 tablet, Rfl: 0 .  feeding supplement, ENSURE ENLIVE, (ENSURE ENLIVE) LIQD, Take 237 mLs by mouth 3 (three) times daily between meals., Disp: 237 mL, Rfl: 12 .  ferrous gluconate (FERGON) 324 MG tablet, TAKE ONE TABLET BY MOUTH TWICE DAILY WITH A MEAL, Disp: 60 tablet, Rfl: 3 .  furosemide (LASIX) 40 MG tablet, Take 0.5 tablets (20 mg total) by mouth daily  as needed for fluid or edema (for weight gain of more than 3 Lbs in 1 day)., Disp: 30 tablet, Rfl: 0 .  latanoprost (XALATAN) 0.005 % ophthalmic solution, Place 1 drop into both eyes at bedtime. , Disp: , Rfl:  .  losartan (COZAAR) 25 MG tablet, Take 1 tablet by mouth daily., Disp: , Rfl:  .  pravastatin (PRAVACHOL) 80 MG tablet, Take 80 mg by mouth daily., Disp: , Rfl:   Allergies:  Allergies  Allergen Reactions  . Ace Inhibitors Swelling    Facial swelling     Past Medical History, Surgical history, Social history, and Family History were reviewed and updated.   Physical Exam: Blood pressure (!) 148/65, pulse 70, temperature 98.6 F (37 C), temperature source Oral, resp. rate 17, height 5' 7.75" (1.721 m), weight 163 lb 1.6 oz (74 kg), SpO2 100 %.   ECOG: 0 General appearance: Well-appearing gentleman without distress. Head: Atraumatic without abnormalities. Oropharynx: Without any thrush or ulcers. Eyes: No scleral icterus. Lymph nodes: No lymphadenopathy noted in the cervical, supraclavicular, and axillary regions. Heart: Regular rate and rhythm without any murmurs or gallops. Lung: Clear to auscultation without any rhonchi, wheezes or dullness to percussion. Abdomin: Soft, nontender without any rebound or guarding Musculoskeletal: No joint deformity or effusion.   Lab Results: Lab Results  Component Value Date   WBC 4.6 02/23/2017   HGB 12.1 (L) 02/23/2017   HCT 37.3 (L) 02/23/2017   MCV 97.6 02/23/2017  PLT 207.0 02/23/2017     Chemistry      Component Value Date/Time   NA 136 02/23/2017 1535   NA 139 06/12/2016 1313   K 4.3 02/23/2017 1535   K 4.4 06/12/2016 1313   CL 105 02/23/2017 1535   CO2 26 02/23/2017 1535   CO2 25 06/12/2016 1313   BUN 24 (H) 02/23/2017 1535   BUN 17.9 06/12/2016 1313   CREATININE 1.40 02/23/2017 1535   CREATININE 1.2 06/12/2016 1313      Component Value Date/Time   CALCIUM 8.9 02/23/2017 1535   CALCIUM 9.3 06/12/2016 1313    ALKPHOS 73 02/23/2017 1535   ALKPHOS 98 06/12/2016 1313   AST 15 02/23/2017 1535   AST 18 06/12/2016 1313   ALT 10 02/23/2017 1535   ALT 16 06/12/2016 1313   BILITOT 0.5 02/23/2017 1535   BILITOT 0.44 06/12/2016 1313      Results for Luis Chen, Luis Chen (MRN [id number]) as of 07/02/2017 08:57  Ref. Range 12/02/2010 13:49 06/23/2011 09:34 07/05/2015 08:51 06/12/2016 13:13  IgG (Immunoglobin G), Serum Latest Ref Range: 700 - 1600 mg/dL 1510 1,890 (H)  2,505  Results for Luis Chen, Luis Chen (MRN [id number]) as of 07/02/2017 08:57  Ref. Range 06/12/2016 13:13  M Protein SerPl Elph-Mcnc Latest Ref Range: Not Observed g/dL 0.2 (H)    Assessment and plan:  77 year old man with the following:  1.  IgG kappa MGUS diagnosed in 2012.  His IgG levels remain normal with an M spike of 0.2 g/dL.  He has no evidence to suggest active myeloma without any end organ damage.  The natural course of this finding was discussed today with the patient and I recommended active surveillance at this time.  Annual protein studies would be recommended at this time and he is agreeable to that.   2.  Multifactorial anemia: Related to anemia of renal disease and mild iron deficiency.  His hemoglobin today appears stable without any need for intervention.  He does not read any request factor support and continues to be on oral iron supplements.  3.  Follow-up: Will be in 12 months to repeat protein studies.   15  minutes was spent with the patient face-to-face today.  More than 50% of time was dedicated to patient counseling, education and answering questions regarding his future plan of care.  Zola Button, MD 4/26/20198:56 AM

## 2017-07-05 LAB — KAPPA/LAMBDA LIGHT CHAINS
Kappa free light chain: 49.5 mg/L — ABNORMAL HIGH (ref 3.3–19.4)
Kappa, lambda light chain ratio: 2.18 — ABNORMAL HIGH (ref 0.26–1.65)
LAMDA FREE LIGHT CHAINS: 22.7 mg/L (ref 5.7–26.3)

## 2017-07-06 LAB — MULTIPLE MYELOMA PANEL, SERUM
ALBUMIN SERPL ELPH-MCNC: 3.3 g/dL (ref 2.9–4.4)
ALPHA 1: 0.2 g/dL (ref 0.0–0.4)
Albumin/Glob SerPl: 1.2 (ref 0.7–1.7)
Alpha2 Glob SerPl Elph-Mcnc: 0.7 g/dL (ref 0.4–1.0)
B-Globulin SerPl Elph-Mcnc: 0.9 g/dL (ref 0.7–1.3)
Gamma Glob SerPl Elph-Mcnc: 1.3 g/dL (ref 0.4–1.8)
Globulin, Total: 3 g/dL (ref 2.2–3.9)
IGA: 144 mg/dL (ref 61–437)
IGM (IMMUNOGLOBULIN M), SRM: 167 mg/dL — AB (ref 15–143)
IgG (Immunoglobin G), Serum: 1325 mg/dL (ref 700–1600)
M Protein SerPl Elph-Mcnc: 0.2 g/dL — ABNORMAL HIGH
TOTAL PROTEIN ELP: 6.3 g/dL (ref 6.0–8.5)

## 2017-08-03 ENCOUNTER — Other Ambulatory Visit: Payer: Self-pay | Admitting: Family Medicine

## 2017-08-03 NOTE — Telephone Encounter (Signed)
Copied from [place] [id number]. Topic: Quick Communication - Rx Refill/Question >> Aug 03, 2017 12:55 PM Waylan Rocher, Lumin L wrote: Medication: carvedilol (COREG) 3.125 MG tablet (3 tabs left)  Has the patient contacted their pharmacy? Yes.   (Agent: If no, request that the patient contact the pharmacy for the refill.) (Agent: If yes, when and what did the pharmacy advise?)  Preferred Pharmacy (with phone number or street name): [place], [place] - [address] [place] [place] [address] Phone: [phone] Fax: [phone]  Agent: Please be advised that RX refills may take up to 3 business days. We ask that you follow-up with your pharmacy.

## 2017-08-04 ENCOUNTER — Other Ambulatory Visit: Payer: Self-pay

## 2017-08-04 MED ORDER — CARVEDILOL 3.125 MG PO TABS: 3.1250 mg | ORAL_TABLET | Freq: Two times a day (BID) | ORAL | 3 refills | Status: DC

## 2017-08-04 NOTE — Telephone Encounter (Signed)
Received and reviewed medication refill request.  Request is appropriate and was approved.  Please see medication orders for details.  

## 2017-08-04 NOTE — Telephone Encounter (Signed)
LOV  02/23/17 Dr. Jonni Sanger Last refill 07/06/15  #[phone]  By another provider.

## 2017-08-25 ENCOUNTER — Ambulatory Visit (INDEPENDENT_AMBULATORY_CARE_PROVIDER_SITE_OTHER): Payer: Medicare Other | Admitting: Family Medicine

## 2017-08-25 ENCOUNTER — Other Ambulatory Visit: Payer: Self-pay

## 2017-08-25 ENCOUNTER — Encounter: Payer: Self-pay | Admitting: Family Medicine

## 2017-08-25 ENCOUNTER — Ambulatory Visit: Payer: Medicare Other

## 2017-08-25 VITALS — BP 132/80 | HR 62 | Temp 97.9°F | Resp 18 | Ht 68.0 in | Wt 160.2 lb

## 2017-08-25 VITALS — BP 132/80 | HR 62 | Temp 97.9°F | Resp 18 | Ht 68.0 in | Wt 160.4 lb

## 2017-08-25 DIAGNOSIS — N183 Chronic kidney disease, stage 3 unspecified: Secondary | ICD-10-CM

## 2017-08-25 DIAGNOSIS — Z23 Encounter for immunization: Secondary | ICD-10-CM

## 2017-08-25 DIAGNOSIS — I428 Other cardiomyopathies: Secondary | ICD-10-CM

## 2017-08-25 DIAGNOSIS — D472 Monoclonal gammopathy: Secondary | ICD-10-CM

## 2017-08-25 DIAGNOSIS — E782 Mixed hyperlipidemia: Secondary | ICD-10-CM | POA: Diagnosis not present

## 2017-08-25 DIAGNOSIS — I10 Essential (primary) hypertension: Secondary | ICD-10-CM | POA: Diagnosis not present

## 2017-08-25 DIAGNOSIS — Z0000 Encounter for general adult medical examination without abnormal findings: Secondary | ICD-10-CM

## 2017-08-25 MED ORDER — ZOSTER VAC RECOMB ADJUVANTED 50 MCG/0.5ML IM SUSR
0.5000 mL | Freq: Once | INTRAMUSCULAR | 1 refills | Status: AC
Start: 2017-08-25 — End: 2017-08-25

## 2017-08-25 NOTE — Patient Instructions (Signed)
Please return in December for your annual complete physical; please come fasting for lab work.   Please schedule an appointment with Dr. Percival Spanish  If you have any questions or concerns, please don't hesitate to send me a message via MyChart or call the office at [phone]. Thank you for visiting with [place] today! It's our pleasure caring for you.  Your blood pressure looks good.

## 2017-08-25 NOTE — Patient Instructions (Addendum)
Shingrix (shingles vaccine) at pharmacy.   Bring a copy of your living will and/or healthcare power of attorney to your next office visit.  Continue doing brain stimulating activities (puzzles, reading, adult coloring books, staying active) to keep memory sharp.   Health Maintenance, Male A healthy lifestyle and preventive care is important for your health and wellness. Ask your health care provider about what schedule of regular examinations is right for you. What should I know about weight and diet? Eat a Healthy Diet  Eat plenty of vegetables, fruits, whole grains, low-fat dairy products, and lean protein.  Do not eat a lot of foods high in solid fats, added sugars, or salt.  Maintain a Healthy Weight Regular exercise can help you achieve or maintain a healthy weight. You should:  Do at least 150 minutes of exercise each week. The exercise should increase your heart rate and make you sweat (moderate-intensity exercise).  Do strength-training exercises at least twice a week.  Watch Your Levels of Cholesterol and Blood Lipids  Have your blood tested for lipids and cholesterol every 5 years starting at 79 years of age. If you are at high risk for heart disease, you should start having your blood tested when you are 80 years old. You may need to have your cholesterol levels checked more often if: ? Your lipid or cholesterol levels are high. ? You are older than 79 years of age. ? You are at high risk for heart disease.  What should I know about cancer screening? Many types of cancers can be detected early and may often be prevented. Lung Cancer  You should be screened every year for lung cancer if: ? You are a current smoker who has smoked for at least 30 years. ? You are a former smoker who has quit within the past 15 years.  Talk to your health care provider about your screening options, when you should start screening, and how often you should be screened.  Colorectal  Cancer  Routine colorectal cancer screening usually begins at 79 years of age and should be repeated every 5-10 years until you are 79 years old. You may need to be screened more often if early forms of precancerous polyps or small growths are found. Your health care provider may recommend screening at an earlier age if you have risk factors for colon cancer.  Your health care provider may recommend using home test kits to check for hidden blood in the stool.  A small camera at the end of a tube can be used to examine your colon (sigmoidoscopy or colonoscopy). This checks for the earliest forms of colorectal cancer.  Prostate and Testicular Cancer  Depending on your age and overall health, your health care provider may do certain tests to screen for prostate and testicular cancer.  Talk to your health care provider about any symptoms or concerns you have about testicular or prostate cancer.  Skin Cancer  Check your skin from head to toe regularly.  Tell your health care provider about any new moles or changes in moles, especially if: ? There is a change in a mole's size, shape, or color. ? You have a mole that is larger than a pencil eraser.  Always use sunscreen. Apply sunscreen liberally and repeat throughout the day.  Protect yourself by wearing long sleeves, pants, a wide-brimmed hat, and sunglasses when outside.  What should I know about heart disease, diabetes, and high blood pressure?  If you are 26-33 years of age,  have your blood pressure checked every 3-5 years. If you are 40 years of age or older, have your blood pressure checked every year. You should have your blood pressure measured twice-once when you are at a hospital or clinic, and once when you are not at a hospital or clinic. Record the average of the two measurements. To check your blood pressure when you are not at a hospital or clinic, you can use: ? An automated blood pressure machine at a pharmacy. ? A home blood  pressure monitor.  Talk to your health care provider about your target blood pressure.  If you are between 45-79 years old, ask your health care provider if you should take aspirin to prevent heart disease.  Have regular diabetes screenings by checking your fasting blood sugar level. ? If you are at a normal weight and have a low risk for diabetes, have this test once every three years after the age of 45. ? If you are overweight and have a high risk for diabetes, consider being tested at a younger age or more often.  A one-time screening for abdominal aortic aneurysm (AAA) by ultrasound is recommended for men aged 65-75 years who are current or former smokers. What should I know about preventing infection? Hepatitis B If you have a higher risk for hepatitis B, you should be screened for this virus. Talk with your health care provider to find out if you are at risk for hepatitis B infection. Hepatitis C Blood testing is recommended for:  Everyone born from 1945 through 1965.  Anyone with known risk factors for hepatitis C.  Sexually Transmitted Diseases (STDs)  You should be screened each year for STDs including gonorrhea and chlamydia if: ? You are sexually active and are younger than 79 years of age. ? You are older than 79 years of age and your health care provider tells you that you are at risk for this type of infection. ? Your sexual activity has changed since you were last screened and you are at an increased risk for chlamydia or gonorrhea. Ask your health care provider if you are at risk.  Talk with your health care provider about whether you are at high risk of being infected with HIV. Your health care provider may recommend a prescription medicine to help prevent HIV infection.  What else can I do?  Schedule regular health, dental, and eye exams.  Stay current with your vaccines (immunizations).  Do not use any tobacco products, such as cigarettes, chewing tobacco, and  e-cigarettes. If you need help quitting, ask your health care provider.  Limit alcohol intake to no more than 2 drinks per day. One drink equals 12 ounces of beer, 5 ounces of wine, or 1 ounces of hard liquor.  Do not use street drugs.  Do not share needles.  Ask your health care provider for help if you need support or information about quitting drugs.  Tell your health care provider if you often feel depressed.  Tell your health care provider if you have ever been abused or do not feel safe at home. This information is not intended to replace advice given to you by your health care provider. Make sure you discuss any questions you have with your health care provider. Document Released: 08/22/2007 Document Revised: 10/23/2015 Document Reviewed: 11/27/2014 Elsevier Interactive Patient Education  2018 Elsevier Inc.  

## 2017-08-25 NOTE — Progress Notes (Signed)
I have reviewed the documentation from the recent AWV done by Kim Broome; I agree with the documentation and will follow up on any recommendations or abnormal findings as suggested.  

## 2017-08-25 NOTE — Progress Notes (Signed)
Subjective  CC:  Chief Complaint  Patient presents with  . Hypertension    doing well, no complaints    HPI: Luis Chen is a 77 y.o. male who presents to the office today to address the problems listed above in the chief complaint. I reviewed todays AWV findings with Luis Ovens, RN.  Hypertension f/u: Control is good . Pt reports he is doing well. taking medications as instructed, no medication side effects noted, no TIAs, no chest pain on exertion, no dyspnea on exertion, no swelling of ankles. Tolerating all meds.  He denies adverse effects from his BP medications. Compliance with medication is good. CHF remains well compensated. He has made an appt with cards for review: last visit 2013.  Renal: stable per renal notes. Anemia but feeling well.   Lipids are at goal on statin.  Assessment  1. Benign essential hypertension   2. Chronic kidney disease (CKD), stage III (moderate) (HCC)   3. H/O NICM (nonischemic cardiomyopathy) ([place])   4. MGUS (monoclonal gammopathy of unknown significance)   5. Mixed hyperlipidemia      Plan    Hypertension f/u: BP control is well controlled. No changes today.  Hyperlipidemia f/u: at goal  ckd with f/u per Dr. Meredeth Ide. No volume overload. chf stable.   HM: up to date.  Education regarding management of these chronic disease states was given. Management strategies discussed on successive visits include dietary and exercise recommendations, goals of achieving and maintaining IBW, and lifestyle modifications aiming for adequate sleep and minimizing stressors.   Follow up: No follow-ups on file.  No orders of the defined types were placed in this encounter.  No orders of the defined types were placed in this encounter.     BP Readings from Last 3 Encounters:  08/25/17 132/80  08/25/17 132/80  07/02/17 (!) 148/65   Wt Readings from Last 3 Encounters:  08/25/17 160 lb 6.4 oz (72.8 kg)  08/25/17 160 lb 4 oz (72.7 kg)  07/02/17  163 lb 1.6 oz (74 kg)    Lab Results  Component Value Date   CHOL 148 02/23/2017   CHOL  10/19/2008    180        ATP III CLASSIFICATION:  <200     mg/dL   Desirable  200-239  mg/dL   Borderline High  >=240    mg/dL   High          Lab Results  Component Value Date   HDL 44.40 02/23/2017   HDL 58 10/19/2008   Lab Results  Component Value Date   LDLCALC 78 02/23/2017   LDLCALC (H) 10/19/2008    110        Total Cholesterol/HDL:CHD Risk Coronary Heart Disease Risk Table                     Men   Women  1/2 Average Risk   3.4   3.3  Average Risk       5.0   4.4  2 X Average Risk   9.6   7.1  3 X Average Risk  23.4   11.0        Use the calculated Patient Ratio above and the CHD Risk Table to determine the patient's CHD Risk.        ATP III CLASSIFICATION (LDL):  <100     mg/dL   Optimal  100-129  mg/dL   Near or Above  Optimal  130-159  mg/dL   Borderline  160-189  mg/dL   High  >190     mg/dL   Very High   Lab Results  Component Value Date   TRIG 127.0 02/23/2017   TRIG 58 10/19/2008   Lab Results  Component Value Date   CHOLHDL 3 02/23/2017   CHOLHDL 3.1 10/19/2008   No results found for: LDLDIRECT Lab Results  Component Value Date   CREATININE 1.31 (H) 07/02/2017   BUN 18 07/02/2017   NA 140 07/02/2017   K 4.1 07/02/2017   CL 110 (H) 07/02/2017   CO2 21 (L) 07/02/2017    The 10-year ASCVD risk score Mikey Bussing DC Jr., et al., 2013) is: 24.8%   Values used to calculate the score:     Age: 21 years     Sex: Male     Is Non-Hispanic African American: Yes     Diabetic: No     Tobacco smoker: No     Systolic Blood Pressure: 696 mmHg     Is BP treated: Yes     HDL Cholesterol: 44.4 mg/dL     Total Cholesterol: 148 mg/dL  I reviewed the patients updated PMH, FH, and SocHx.    Patient Active Problem List   Diagnosis Date Noted  . H/O NICM (nonischemic cardiomyopathy) ([place]) 07/03/2015  . Nonischemic cardiomyopathy ([place]) 07/03/2015    . Pyogenic arthritis of left knee joint ([place])   . Anemia, chronic disease 07/01/2015  . Primary osteoarthritis of knees, bilateral 12/10/2014  . MGUS (monoclonal gammopathy of unknown significance) 06/30/2010  . Chronic kidney disease (CKD), stage III (moderate) ([place]) 03/24/2010  . Benign essential hypertension 10/31/2008  . Mixed hyperlipidemia 10/30/2008    Allergies: Ace inhibitors  Social History: Patient  reports that he has never smoked. He has never used smokeless tobacco. He reports that he drinks alcohol. He reports that he does not use drugs.  No outpatient medications have been marked as taking for the 08/25/17 encounter (Office Visit) with Leamon Arnt, MD.    Review of Systems: Cardiovascular: negative for chest pain, palpitations, leg swelling, orthopnea Respiratory: negative for SOB, wheezing or persistent cough Gastrointestinal: negative for abdominal pain Genitourinary: negative for dysuria or gross hematuria  Objective  Vitals: BP 132/80   Pulse 62   Temp 97.9 F (36.6 C)   Resp 18   Ht 5\' 8"  (1.727 m)   Wt 160 lb 6.4 oz (72.8 kg)   SpO2 98%   BMI 24.39 kg/m  General: no acute distress  Psych:  Alert and oriented, normal mood and affect HEENT:  Normocephalic, atraumatic, supple neck  Cardiovascular:  RRR with soft murmur. no edema Respiratory:  Good breath sounds bilaterally, CTAB with normal respiratory effort Skin:  Warm, no rashes Neurologic:   Mental status is normal  Commons side effects, risks, benefits, and alternatives for medications and treatment plan prescribed today were discussed, and the patient expressed understanding of the given instructions. Patient is instructed to call or message via MyChart if he/she has any questions or concerns regarding our treatment plan. No barriers to understanding were identified. We discussed Red Flag symptoms and signs in detail. Patient expressed understanding regarding what to do in case of urgent or  emergency type symptoms.   Medication list was reconciled, printed and provided to the patient in AVS. Patient instructions and summary information was reviewed with the patient as documented in the AVS. This note was prepared with assistance of Dragon voice recognition  software. Occasional wrong-word or sound-a-like substitutions may have occurred due to the inherent limitations of voice recognition software

## 2017-08-25 NOTE — Progress Notes (Signed)
Subjective:   Luis Chen is a 77 y.o. male who presents for Medicare Annual/Subsequent preventive examination.  Review of Systems:  No ROS.  Medicare Wellness Visit. Additional risk factors are reflected in the social history.  Cardiac Risk Factors include: advanced age (>21men, >58 women);hypertension;dyslipidemia;male gender;family history of premature cardiovascular disease   Sleep patterns: Sleeps 7 hours, reports nightmares (takes Trazodone-BH at [place]) Home Safety/Smoke Alarms: Feels safe in home. Smoke alarms in place.  Living environment; residence and Firearm Safety: Yolanda Bonine (and occasional son) lives with pt in 3 story home, rails in place.  Seat Belt Safety/Bike Helmet: Wears seat belt.   Male:   CCS-Colonoscopy, 03/09/2008 ?  Pt reports normal.  PSA- No results found for: PSA      Objective:    Vitals: BP 132/80 (BP Location: Left Arm, Patient Position: Sitting, Cuff Size: Normal)   Pulse 62   Temp 97.9 F (36.6 C) (Temporal)   Resp 18   Ht 5\' 8"  (1.727 m)   Wt 160 lb 4 oz (72.7 kg)   SpO2 98%   BMI 24.37 kg/m   Body mass index is 24.37 kg/m.  Advanced Directives 08/25/2017 06/12/2016 11/08/2015 08/08/2015 07/01/2015  Does Patient Have a Medical Advance Directive? No No No No Yes  Type of Advance Directive - - - - Press photographer  Does patient want to make changes to medical advance directive? - - - - No - Patient declined  Copy of [place] in Chart? - - - - No - copy requested  Would patient like information on creating a medical advance directive? Yes (MAU/Ambulatory/Procedural Areas - Information given) - Yes - Educational materials given Yes - Scientist, clinical (histocompatibility and immunogenetics) given -    Tobacco Social History   Tobacco Use  Smoking Status Never Smoker  Smokeless Tobacco Never Used     Counseling given: Not Answered    Past Medical History:  Diagnosis Date  . CAD (coronary artery disease)    non obstructive. Left main normal.  LAd proximal long 25% stenosis, termingating as focal 50% prox to mid lesion. First & second diag were small, normal. Circumflex in proximal av groove had luminal irregularities.. Was large mid obtuse marg which was branching, scattered luminal irregul. Infer branch did supply some septal perforators. Was PDA,small & normal.  < 1.27mm. There was prox 95% stenosis  . Cardiomyopathy    EF was 25% improved to 50%.   . CHF (congestive heart failure) ([place])   . CKD (chronic kidney disease)   . Hyperlipidemia    transient history, controlled on diet  . Hypertension    Past Surgical History:  Procedure Laterality Date  . KNEE ARTHROSCOPY Left 07/01/2015   Procedure: Irrigation and debridement left knee arthroscopy;  Surgeon: Renette Butters, MD;  Location: [facility];  Service: Orthopedics;  Laterality: Left;   Family History  Problem Relation Age of Onset  . Diabetes Mother   . Arthritis Mother   . Heart disease Mother   . Hyperlipidemia Mother   . Hypertension Mother   . Heart disease Brother        had in his 11s. Had CABG x2  . Hypothyroidism Daughter   . Stroke Daughter    Social History   Socioeconomic History  . Marital status: Widowed    Spouse name: Not on file  . Number of children: Not on file  . Years of education: Not on file  . Highest education level: Not on file  Occupational History  . Not on file  Social Needs  . Financial resource strain: Not on file  . Food insecurity:    Worry: Not on file    Inability: Not on file  . Transportation needs:    Medical: Not on file    Non-medical: Not on file  Tobacco Use  . Smoking status: Never Smoker  . Smokeless tobacco: Never Used  Substance and Sexual Activity  . Alcohol use: Yes    Comment: one glass of wine or beer occassionaly  . Drug use: No  . Sexual activity: Yes  Lifestyle  . Physical activity:    Days per week: Not on file    Minutes per session: Not on file  . Stress: Not on file  Relationships  . Social  connections:    Talks on phone: Not on file    Gets together: Not on file    Attends religious service: Not on file    Active member of club or organization: Not on file    Attends meetings of clubs or organizations: Not on file    Relationship status: Not on file  Other Topics Concern  . 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     Outpatient Encounter Medications as of 08/25/2017  Medication Sig  . aspirin 81 MG tablet Take 81 mg by mouth daily.    . carvedilol (COREG) 3.125 MG tablet Take 1 tablet (3.125 mg total) by mouth 2 (two) times daily with a meal.  . feeding supplement, ENSURE ENLIVE, (ENSURE ENLIVE) LIQD Take 237 mLs by mouth 3 (three) times daily between meals.  . ferrous gluconate (FERGON) 324 MG tablet TAKE ONE TABLET BY MOUTH TWICE DAILY WITH A MEAL  . furosemide (LASIX) 40 MG tablet Take 0.5 tablets (20 mg total) by mouth daily as needed for fluid or edema (for weight gain of more than 3 Lbs in 1 day).  Marland Kitchen latanoprost (XALATAN) 0.005 % ophthalmic solution Place 1 drop into both eyes at bedtime.   Marland Kitchen losartan (COZAAR) 25 MG tablet Take 1 tablet by mouth daily.  . pravastatin (PRAVACHOL) 80 MG tablet Take 80 mg by mouth daily.  . TRAZODONE HCL PO Take 75 mg by mouth at bedtime as needed.  . Zoster Vaccine Adjuvanted [facility]) injection Inject 0.5 mLs into the muscle once for 1 dose.   No facility-administered encounter medications on file as of 08/25/2017.     Activities of Daily Living In your present state of health, do you have any difficulty performing the following activities: 08/25/2017  Hearing? N  Vision? N  Difficulty concentrating or making decisions? N  Walking or climbing stairs? N  Dressing or bathing? N  Doing errands, shopping? N  Preparing Food and eating ? N  Using the Toilet? N  In the past six months, have you accidently leaked urine?  N  Do you have problems with loss of bowel control? N  Managing your Medications? N  Managing your Finances? N  Housekeeping or managing your Housekeeping? N  Some recent data might be hidden    Patient Care Team: Leamon Arnt, MD as PCP - General (Family Medicine) Renette Butters, MD as Attending Physician (Orthopedic Surgery) [name], MD as Consulting Physician (Nephrology) Caryl Pina, MD (Hematology) [name], MD as Consulting Physician (Cardiology) [facility] providers   Assessment:   This  is a routine wellness examination for [place].  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   Breakfast: Oatmeal, cereal, eggs, [place] bacon Lunch: sandwich Dinner: salad; poultry; beef hot dog.   Avoids fried foods.   Goals    . Patient Stated     Maintain current health by staying active.        Fall Risk Fall Risk  08/25/2017 02/23/2017  Falls in the past year? No No     Depression Screen PHQ 2/9 Scores 08/25/2017 02/23/2017  PHQ - 2 Score 0 1    Cognitive Function MMSE - Mini Mental State Exam 08/25/2017  Orientation to time 5  Orientation to Place 5  Registration 3  Attention/ Calculation 3  Recall 3  Language- name 2 objects 2  Language- repeat 1  Language- follow 3 step command 3  Language- read & follow direction 1  Write a sentence 1  Copy design 1  Total score 28        Immunization History  Administered Date(s) Administered  . Influenza Split 11/03/2010  . Influenza, High Dose Seasonal PF 11/30/2011, 12/06/2013, 12/10/2014, 12/20/2015  . Influenza,inj,Quad PF,6+ Mos 12/05/2012  .  Influenza-Unspecified 01/31/2017  . Pneumococcal Conjugate-13 12/06/2013  . Pneumococcal Polysaccharide-23 03/09/2008  . Tdap 12/03/2008  . Zoster 03/09/2008    Screening Tests Health Maintenance  Topic Date Due  . INFLUENZA VACCINE  10/07/2017  . TETANUS/TDAP  12/04/2018  . PNA vac Low Risk Adult  Completed        Plan:     Shingrix (shingles vaccine) at pharmacy.   Bring a copy of your living will and/or healthcare power of attorney to your next office visit.  Continue doing brain stimulating activities (puzzles, reading, adult coloring books, staying active) to keep memory sharp.   I have personally reviewed and noted the following in the patient's chart:   . Medical and social history . Use of alcohol, tobacco or illicit drugs  . Current medications and supplements . Functional ability and status . Nutritional status . Physical activity . Advanced directives . List of other physicians . Hospitalizations, surgeries, and ER visits in previous 12 months . Vitals . Screenings to include cognitive, depression, and falls . Referrals and appointments  In addition, I have reviewed and discussed with patient certain preventive protocols, quality metrics, and best practice recommendations. A written personalized care plan for preventive services as well as general preventive health recommendations were provided to patient.     Gerilyn Nestle, RN  08/25/2017

## 2017-09-24 ENCOUNTER — Other Ambulatory Visit: Payer: Self-pay | Admitting: Oncology

## 2017-10-14 ENCOUNTER — Ambulatory Visit: Payer: Medicare Other | Admitting: Cardiology

## 2017-10-16 NOTE — Progress Notes (Signed)
Cardiology Office Note   Date:  10/18/2017   ID:  Luis Chen, DOB [birth year], MRN [id number]  PCP:  Leamon Arnt, MD  Cardiologist:   No primary care provider on file. Referring:  Leamon Arnt, MD   No chief complaint on file.     History of Present Illness: Luis Chen is a 77 y.o. male who presents for follow up .  I saw him last in 2013.   He had a previous EF of 25% that improved to 55% on echo in 2013.  Since, he had a septic left knee in April 2017 that was washed out in the OR, since has been doing fine. States he feels intermittent chest discomfort mostly after work while at rest lasting for less than a minute. He takes apple cider vinegar and the discomfort dissipates. This chest discomfort does not happen with exertion. Reports he is able to cycle on his bike twice/week, walk from parking lot to parking lot at work, and bowl twice/week without any chest pain, pressure, tightness, or shortness of breath. No heart palpitations, PND, orthopnea, or leg swelling. Reports his BP usually runs in the 150s despite taking his medications regularly.     Past Medical History:  Diagnosis Date  . CAD (coronary artery disease)    non obstructive. Left main normal. LAd proximal long 25% stenosis, termingating as focal 50% prox to mid lesion. First & second diag were small, normal. Circumflex in proximal av groove had luminal irregularities.. Was large mid obtuse marg which was branching, scattered luminal irregul. Infer branch did supply some septal perforators. Was PDA,small & normal.  < 1.37mm. There was prox 95% stenosis  . Cardiomyopathy    EF was 25% improved to 50%.   . CHF (congestive heart failure) ([place])   . CKD (chronic kidney disease)   . Hyperlipidemia    transient history, controlled on diet  . Hypertension     Past Surgical History:  Procedure Laterality Date  . KNEE ARTHROSCOPY Left 07/01/2015   Procedure: Irrigation and debridement left knee arthroscopy;   Surgeon: Renette Butters, MD;  Location: [place];  Service: Orthopedics;  Laterality: Left;     Current Outpatient Medications  Medication Sig Dispense Refill  . aspirin 81 MG tablet Take 81 mg by mouth daily.      . carvedilol (COREG) 3.125 MG tablet Take 1 tablet (3.125 mg total) by mouth 2 (two) times daily with a meal. 180 tablet 3  . feeding supplement, ENSURE ENLIVE, (ENSURE ENLIVE) LIQD Take 237 mLs by mouth 3 (three) times daily between meals. 237 mL 12  . ferrous gluconate (FERGON) 324 MG tablet TAKE ONE TABLET BY MOUTH TWICE DAILY WITH A MEAL 60 tablet 3  . furosemide (LASIX) 40 MG tablet Take 0.5 tablets (20 mg total) by mouth daily as needed for fluid or edema (for weight gain of more than 3 Lbs in 1 day). 30 tablet 0  . latanoprost (XALATAN) 0.005 % ophthalmic solution Place 1 drop into both eyes at bedtime.     Marland Kitchen losartan (COZAAR) 25 MG tablet Take 1 tablet by mouth daily.    . pravastatin (PRAVACHOL) 80 MG tablet Take 80 mg by mouth daily.    . TRAZODONE HCL PO Take 75 mg by mouth at bedtime as needed.     No current facility-administered medications for this visit.     Allergies:   Ace inhibitors    Social History:  The patient  reports that he has never smoked. He has never used smokeless tobacco. He reports that he drinks alcohol. He reports that he does not use drugs.   Family History:  The patient's family history includes Arthritis in his mother; Diabetes in his mother; Heart attack (age of onset: 6) in his mother; Heart disease in his brother and mother; Hyperlipidemia in his mother; Hypertension in his mother; Hypothyroidism in his daughter; Stroke in his daughter.    ROS:  Please see the history of present illness.   All other systems are reviewed and negative.    PHYSICAL EXAM: VS:  BP (!) 158/70   Pulse 65   Ht 5\' 10"  (1.778 m)   Wt 155 lb 12.8 oz (70.7 kg)   BMI 22.35 kg/m  , BMI Body mass index is 22.35 kg/m. GENERAL:  Well appearing HEENT:  Pupils  equal round and reactive, fundi not visualized, oral mucosa unremarkable NECK:  No jugular venous distention, waveform within normal limits, carotid upstroke brisk and symmetric, no bruits, no thyromegaly LYMPHATICS:  No cervical, inguinal adenopathy LUNGS:  Clear to auscultation bilaterally BACK:  No CVA tenderness CHEST:  Unremarkable HEART:  PMI not displaced or sustained,S1 and S2 within normal limits, no S3, no S4, no clicks, no rubs, no murmurs ABD:  Flat, positive bowel sounds normal in frequency in pitch, no bruits, no rebound, no guarding, no midline pulsatile mass, no hepatomegaly, no splenomegaly EXT:  2 plus pulses throughout, no edema, no cyanosis no clubbing SKIN:  No rashes no nodules NEURO:  Cranial nerves II through XII grossly intact, motor grossly intact throughout PSYCH:  Cognitively intact, oriented to person place and time    EKG:  EKG is ordered today. The ekg ordered today demonstrates normal sinus rhythm, rate 65. LVH with T wave inversions notes in V5 and V6. This is new since previous EKG.    Recent Labs: 07/02/2017: ALT 16; BUN 18; Creatinine, Ser 1.31; Hemoglobin 11.6; Platelets 181; Potassium 4.1; Sodium 140    Lipid Panel    Component Value Date/Time   CHOL 148 02/23/2017 1535   TRIG 127.0 02/23/2017 1535   HDL 44.40 02/23/2017 1535   CHOLHDL 3 02/23/2017 1535   VLDL 25.4 02/23/2017 1535   LDLCALC 78 02/23/2017 1535      Wt Readings from Last 3 Encounters:  10/18/17 155 lb 12.8 oz (70.7 kg)  08/25/17 160 lb 6.4 oz (72.8 kg)  08/25/17 160 lb 4 oz (72.7 kg)      Other studies Reviewed: Additional studies/ records that were reviewed today include: hospitalization April 2017 Review of the above records demonstrates: septic left knee . Please see elsewhere in the note.     ASSESSMENT AND PLAN:  CARDIOMYOPATHY:  I would like to reassess his ejection fraction with an echocardiogram, last was in 2013 showing EF of 55%.   CAD:    He does have  some atypical chest pain as reported and there are EKG changes from last in 2013 with LVH and T wave inversions noted in lateral precordial leads. If echocardiogram does not show wall abnormalities will do POET (Plain Old Exercise Treadmill) afterwards.    HTN:  Blood pressure is consistently elevated at home, 150s. Today it is 158/70. He will do a blood pressure diary and return with results.   ABNORMAL EKG:  T wave inversions are new.  I will follow up on this as above.    Current medicines are reviewed at length with the patient today.  The patient does not have concerns regarding medicines.  The following changes have been made:  no change  Labs/ tests ordered today include: EKG No orders of the defined types were placed in this encounter.    Disposition:   FU with me in 18 months.     Signed, [name], MD  10/18/2017 9:19 AM    [facility]

## 2017-10-18 ENCOUNTER — Ambulatory Visit (INDEPENDENT_AMBULATORY_CARE_PROVIDER_SITE_OTHER): Payer: Medicare Other | Admitting: Cardiology

## 2017-10-18 ENCOUNTER — Encounter: Payer: Self-pay | Admitting: Cardiology

## 2017-10-18 VITALS — BP 158/70 | HR 65 | Ht 70.0 in | Wt 155.8 lb

## 2017-10-18 DIAGNOSIS — R9431 Abnormal electrocardiogram [ECG] [EKG]: Secondary | ICD-10-CM

## 2017-10-18 DIAGNOSIS — I10 Essential (primary) hypertension: Secondary | ICD-10-CM

## 2017-10-18 DIAGNOSIS — I25119 Atherosclerotic heart disease of native coronary artery with unspecified angina pectoris: Secondary | ICD-10-CM

## 2017-10-18 NOTE — Patient Instructions (Signed)
Medication Instructions:  Continue current medications  If you need a refill on your cardiac medications before your next appointment, please call your pharmacy.  Labwork: None Ordered   Testing/Procedures: Your physician has requested that you have an echocardiogram. Echocardiography is a painless test that uses sound waves to create images of your heart. It provides your doctor with information about the size and shape of your heart and how well your heart's chambers and valves are working. This procedure takes approximately one hour. There are no restrictions for this procedure.  Special Instructions: Keep daily Blood pressure diary   Follow-Up: Your physician wants you to follow-up in: 1 Year. You should receive a reminder letter in the mail two months in advance. If you do not receive a letter, please call our office [phone].      Thank you for choosing [facility]!!

## 2017-10-25 DIAGNOSIS — M79645 Pain in left finger(s): Secondary | ICD-10-CM | POA: Diagnosis not present

## 2017-10-27 ENCOUNTER — Other Ambulatory Visit (HOSPITAL_COMMUNITY): Payer: Medicare Other

## 2017-10-28 ENCOUNTER — Other Ambulatory Visit: Payer: Self-pay

## 2017-10-28 ENCOUNTER — Ambulatory Visit (HOSPITAL_COMMUNITY): Payer: Medicare Other | Attending: Cardiovascular Disease

## 2017-10-28 DIAGNOSIS — I088 Other rheumatic multiple valve diseases: Secondary | ICD-10-CM | POA: Insufficient documentation

## 2017-10-28 DIAGNOSIS — R9431 Abnormal electrocardiogram [ECG] [EKG]: Secondary | ICD-10-CM

## 2017-10-28 DIAGNOSIS — I2510 Atherosclerotic heart disease of native coronary artery without angina pectoris: Secondary | ICD-10-CM | POA: Diagnosis not present

## 2017-10-28 DIAGNOSIS — I509 Heart failure, unspecified: Secondary | ICD-10-CM | POA: Diagnosis not present

## 2017-10-28 DIAGNOSIS — E785 Hyperlipidemia, unspecified: Secondary | ICD-10-CM | POA: Insufficient documentation

## 2017-10-28 DIAGNOSIS — I110 Hypertensive heart disease with heart failure: Secondary | ICD-10-CM | POA: Diagnosis not present

## 2017-10-29 ENCOUNTER — Telehealth: Payer: Self-pay | Admitting: *Deleted

## 2017-10-29 ENCOUNTER — Encounter: Payer: Self-pay | Admitting: Cardiology

## 2017-10-29 DIAGNOSIS — R079 Chest pain, unspecified: Secondary | ICD-10-CM

## 2017-10-29 NOTE — Telephone Encounter (Signed)
-----   Message from [name], MD sent at 10/28/2017  9:15 PM EDT ----- EF was still normal.  There is no new all motion abnormality but there continues to be some posterior wall hypokinesis.  There is LVH.  Continue current therapy.  I would like to order a POET for evaluation of chest pain.  Call Mr. Fischman with the results and send results to Leamon Arnt, MD

## 2017-10-29 NOTE — Telephone Encounter (Signed)
Pt aware of Echo, GXT ordered and send to scheduler to be schedule

## 2017-11-01 DIAGNOSIS — M25512 Pain in left shoulder: Secondary | ICD-10-CM | POA: Diagnosis not present

## 2017-11-02 DIAGNOSIS — M12842 Other specific arthropathies, not elsewhere classified, left hand: Secondary | ICD-10-CM | POA: Diagnosis not present

## 2017-11-02 DIAGNOSIS — M12832 Other specific arthropathies, not elsewhere classified, left wrist: Secondary | ICD-10-CM | POA: Diagnosis not present

## 2017-11-05 ENCOUNTER — Telehealth (HOSPITAL_COMMUNITY): Payer: Self-pay

## 2017-11-05 NOTE — Telephone Encounter (Signed)
Encounter complete. 

## 2017-11-09 DIAGNOSIS — M25532 Pain in left wrist: Secondary | ICD-10-CM | POA: Diagnosis not present

## 2017-11-10 ENCOUNTER — Telehealth: Payer: Self-pay | Admitting: *Deleted

## 2017-11-10 ENCOUNTER — Encounter (HOSPITAL_COMMUNITY): Payer: Self-pay | Admitting: *Deleted

## 2017-11-10 ENCOUNTER — Ambulatory Visit (HOSPITAL_COMMUNITY)
Admission: RE | Admit: 2017-11-10 | Discharge: 2017-11-10 | Disposition: A | Discharge status: Home / Self Care | Payer: Medicare Other | Source: Ambulatory Visit | Attending: Cardiovascular Disease | Admitting: Cardiovascular Disease

## 2017-11-10 ENCOUNTER — Encounter (HOSPITAL_COMMUNITY): Payer: Self-pay

## 2017-11-10 DIAGNOSIS — R079 Chest pain, unspecified: Secondary | ICD-10-CM | POA: Insufficient documentation

## 2017-11-10 NOTE — Telephone Encounter (Signed)
Pt came in today for ETT, ETT was cancel because of abnormal EKG, Dr Oval Linsey reviewed and recommended a nuclear study(Lexiscan Myoview), please advised

## 2017-11-10 NOTE — Progress Notes (Signed)
Dr Oval Linsey (DOD) canceled ETT due to abnormal ekg and recommends a nuclear study for pt. Hebert Soho is sending message to Dr Percival Spanish.

## 2017-11-13 NOTE — Telephone Encounter (Signed)
Schedule Lexiscan Myoview.

## 2017-11-15 ENCOUNTER — Telehealth: Payer: Self-pay | Admitting: Cardiology

## 2017-11-15 NOTE — Telephone Encounter (Signed)
Called patient and LVM to call and scheduled myocardial perfusion test.

## 2017-11-15 NOTE — Telephone Encounter (Signed)
Lexiscan Myoview ordered and send to scheduler to be schedule

## 2018-01-12 ENCOUNTER — Encounter: Payer: Self-pay | Admitting: Cardiology

## 2018-01-14 ENCOUNTER — Telehealth (HOSPITAL_COMMUNITY): Payer: Self-pay

## 2018-01-14 NOTE — Telephone Encounter (Signed)
Encounter complete. 

## 2018-01-19 ENCOUNTER — Ambulatory Visit (HOSPITAL_COMMUNITY)
Admission: RE | Admit: 2018-01-19 | Discharge: 2018-01-19 | Disposition: A | Discharge status: Home / Self Care | Payer: Medicare Other | Source: Ambulatory Visit | Attending: Cardiology | Admitting: Cardiology

## 2018-01-19 DIAGNOSIS — R079 Chest pain, unspecified: Secondary | ICD-10-CM | POA: Diagnosis not present

## 2018-01-19 LAB — MYOCARDIAL PERFUSION IMAGING
CHL CUP NUCLEAR SRS: 22
CHL CUP NUCLEAR SSS: 29
LV dias vol: 244 mL (ref 62–150)
LVSYSVOL: 205 mL
Peak HR: 87 {beats}/min
Rest HR: 71 {beats}/min
SDS: 7
TID: 1.04

## 2018-01-19 MED ORDER — TECHNETIUM TC 99M TETROFOSMIN IV KIT
10.7000 | PACK | Freq: Once | INTRAVENOUS | Status: AC | PRN
  Administered 2018-01-19: 10.7 via INTRAVENOUS
  Filled 2018-01-19: qty 11

## 2018-01-19 MED ORDER — REGADENOSON 0.4 MG/5ML IV SOLN
0.4000 mg | Freq: Once | INTRAVENOUS | Status: AC
  Administered 2018-01-19: 0.4 mg via INTRAVENOUS

## 2018-01-19 MED ORDER — TECHNETIUM TC 99M TETROFOSMIN IV KIT
31.5000 | PACK | Freq: Once | INTRAVENOUS | Status: AC | PRN
  Administered 2018-01-19: 31.5 via INTRAVENOUS
  Filled 2018-01-19: qty 32

## 2018-01-23 NOTE — H&P (View-Only) (Signed)
Cardiology Office Note   Date:  01/25/2018   ID:  [name], DOB [birth year], MRN [id number]  PCP:  Leamon Arnt, MD  Cardiologist:   No primary care provider on file. Referring:  Leamon Arnt, MD   Chief Complaint  Patient presents with  . Abnormal ECG      History of Present Illness: Luis Chen is a 77 y.o. male who presents for follow up .  In 2013 he had a previous EF of 25% that improved to 55% on echo in 2013.   When I saw him earlier this year he had chest pain.  In August he had an echo with an EF  That was normal with evidence of posterior wall hypokinesis.   Lexiscan Myoview suggested that the EF was 16% with multiple perfusion defects.   I brought him back to discuss this.   At the last appointment he had had intermittent chest discomfort and was drinking some vinegar to get rid of it.  He did have new T wave inversions on his EKG that I had seen previously in the lateral leads.  He is very vague about his symptoms now but might still get this discomfort.  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  He does not bring on chest discomfort though he might get some dyspnea with this.   Past Medical History:  Diagnosis Date  . CAD (coronary artery disease)    non obstructive. Left main normal. LAd proximal long 25% stenosis, termingating as focal 50% prox to mid lesion. First & second diag were small, normal. Circumflex in proximal av groove had luminal irregularities.. Was large mid obtuse marg which was branching, scattered luminal irregul. Infer branch did supply some septal perforators. Was PDA,small & normal.  < 1.77mm. There was prox 95% stenosis  . Cardiomyopathy    EF was 25% improved to 50%.   . CHF (congestive heart failure) ([place])   . CKD (chronic kidney disease)   . Hyperlipidemia    transient history, controlled on diet  . Hypertension     Past Surgical History:  Procedure Laterality Date  . KNEE ARTHROSCOPY Left  07/01/2015   Procedure: Irrigation and debridement left knee arthroscopy;  Surgeon: Renette Butters, MD;  Location: [place];  Service: Orthopedics;  Laterality: Left;     Current Outpatient Medications  Medication Sig Dispense Refill  . aspirin 81 MG tablet Take 81 mg by mouth daily.      . carvedilol (COREG) 3.125 MG tablet Take 1 tablet (3.125 mg total) by mouth 2 (two) times daily with a meal. 180 tablet 3  . feeding supplement, ENSURE ENLIVE, (ENSURE ENLIVE) LIQD Take 237 mLs by mouth 3 (three) times daily between meals. 237 mL 12  . ferrous gluconate (FERGON) 324 MG tablet TAKE ONE TABLET BY MOUTH TWICE DAILY WITH A MEAL 60 tablet 3  . furosemide (LASIX) 40 MG tablet Take 0.5 tablets (20 mg total) by mouth daily as needed for fluid or edema (for weight gain of more than 3 Lbs in 1 day). 30 tablet 0  . latanoprost (XALATAN) 0.005 % ophthalmic solution Place 1 drop into both eyes at bedtime.     Marland Kitchen losartan (COZAAR) 25 MG tablet Take 1 tablet by mouth daily.    . pravastatin (PRAVACHOL) 80 MG tablet Take 80 mg by mouth daily.    . TRAZODONE HCL PO Take 75 mg by mouth at bedtime as needed.  No current facility-administered medications for this visit.     Allergies:   Ace inhibitors    ROS:     Positive for swelling in his left arm and hand at times.  Positive for anxiety and decreased sleep..   All other systems are reviewed and negative.    PHYSICAL EXAM: VS:  BP 104/70   Pulse 76   Ht 5\' 10"  (1.778 m)   Wt 152 lb (68.9 kg)   BMI 21.81 kg/m  , BMI Body mass index is 21.81 kg/m. GENERAL:  Well appearing NECK:  No jugular venous distention, waveform within normal limits, carotid upstroke brisk and symmetric, no bruits, no thyromegaly LUNGS:  Clear to auscultation bilaterally CHEST:  Unremarkable HEART:  PMI not displaced or sustained,S1 and S2 within normal limits, no S3, no S4, no clicks, no rubs, no murmurs ABD:  Flat, positive bowel sounds normal in frequency in pitch, no  bruits, no rebound, no guarding, no midline pulsatile mass, no hepatomegaly, no splenomegaly EXT:  2 plus pulses throughout, no edema, no cyanosis no clubbing   EKG:  EKG is not ordered today.    Recent Labs: 07/02/2017: ALT 16; BUN 18; Creatinine, Ser 1.31; Hemoglobin 11.6; Platelets 181; Potassium 4.1; Sodium 140    Lipid Panel    Component Value Date/Time   CHOL 148 02/23/2017 1535   TRIG 127.0 02/23/2017 1535   HDL 44.40 02/23/2017 1535   CHOLHDL 3 02/23/2017 1535   VLDL 25.4 02/23/2017 1535   LDLCALC 78 02/23/2017 1535      Wt Readings from Last 3 Encounters:  01/25/18 152 lb (68.9 kg)  01/19/18 155 lb (70.3 kg)  10/18/17 155 lb 12.8 oz (70.7 kg)      Other studies Reviewed: Additional studies/ records that were reviewed today include:   I personally reviewed the echo images and reviewed the results of the [place].   Review of the above records demonstrates: septic left knee .    ASSESSMENT AND PLAN:  CARDIOMYOPATHY:  I reviewed the echo images from August and suggest that they are lower than the 37% reported but certainly better than the 19% reported from [facility].  I personally reviewed the images and thought that there was apical hypokinesis and his ejection fraction was closer to 45%.  Given the symptoms, change in EKG and the markedly abnormal Lexiscan I think cardiac cath is indicated as below.  CAD:   He had a markedly abnormal Lexiscan Myoview.   Cardiac cath is indicated.  The patient understands that risks included but are not limited to stroke (1 in 1000), death (1 in 52), kidney failure [usually temporary] (1 in 500), bleeding (1 in 200), allergic reaction [possibly serious] (1 in 200).  The patient understands and agrees to proceed.    HTN:  Blood pressure is controlled.  No change in therapy pending the results above.    Current medicines are reviewed at length with the patient today.  The patient does not have concerns regarding  medicines.  The following changes have been made:   None  Labs/ tests ordered today include:     Orders Placed This Encounter  Procedures  . Basic Metabolic Panel (BMET)  . CBC  . TSH     Disposition:   FU with me after the cath.    Signed, [name], MD  01/25/2018 2:52 PM    [facility]

## 2018-01-23 NOTE — Progress Notes (Signed)
Cardiology Office Note   Date:  01/25/2018   ID:  DAXSON REFFETT, DOB [birth year], MRN [id number]  PCP:  Leamon Arnt, MD  Cardiologist:   No primary care provider on file. Referring:  Leamon Arnt, MD   Chief Complaint  Patient presents with  . Abnormal ECG      History of Present Illness: Luis Chen is a 77 y.o. male who presents for follow up .  In 2013 he had a previous EF of 25% that improved to 55% on echo in 2013.   When I saw him earlier this year he had chest pain.  In August he had an echo with an EF  That was normal with evidence of posterior wall hypokinesis.   Lexiscan Myoview suggested that the EF was 16% with multiple perfusion defects.   I brought him back to discuss this.   At the last appointment he had had intermittent chest discomfort and was drinking some vinegar to get rid of it.  He did have new T wave inversions on his EKG that I had seen previously in the lateral leads.  He is very vague about his symptoms now but might still get this discomfort.  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  He does not bring on chest discomfort though he might get some dyspnea with this.   Past Medical History:  Diagnosis Date  . CAD (coronary artery disease)    non obstructive. Left main normal. LAd proximal long 25% stenosis, termingating as focal 50% prox to mid lesion. First & second diag were small, normal. Circumflex in proximal av groove had luminal irregularities.. Was large mid obtuse marg which was branching, scattered luminal irregul. Infer branch did supply some septal perforators. Was PDA,small & normal.  < 1.43mm. There was prox 95% stenosis  . Cardiomyopathy    EF was 25% improved to 50%.   . CHF (congestive heart failure) ([place])   . CKD (chronic kidney disease)   . Hyperlipidemia    transient history, controlled on diet  . Hypertension     Past Surgical History:  Procedure Laterality Date  . KNEE ARTHROSCOPY Left  07/01/2015   Procedure: Irrigation and debridement left knee arthroscopy;  Surgeon: Renette Butters, MD;  Location: [place];  Service: Orthopedics;  Laterality: Left;     Current Outpatient Medications  Medication Sig Dispense Refill  . aspirin 81 MG tablet Take 81 mg by mouth daily.      . carvedilol (COREG) 3.125 MG tablet Take 1 tablet (3.125 mg total) by mouth 2 (two) times daily with a meal. 180 tablet 3  . feeding supplement, ENSURE ENLIVE, (ENSURE ENLIVE) LIQD Take 237 mLs by mouth 3 (three) times daily between meals. 237 mL 12  . ferrous gluconate (FERGON) 324 MG tablet TAKE ONE TABLET BY MOUTH TWICE DAILY WITH A MEAL 60 tablet 3  . furosemide (LASIX) 40 MG tablet Take 0.5 tablets (20 mg total) by mouth daily as needed for fluid or edema (for weight gain of more than 3 Lbs in 1 day). 30 tablet 0  . latanoprost (XALATAN) 0.005 % ophthalmic solution Place 1 drop into both eyes at bedtime.     Marland Kitchen losartan (COZAAR) 25 MG tablet Take 1 tablet by mouth daily.    . pravastatin (PRAVACHOL) 80 MG tablet Take 80 mg by mouth daily.    . TRAZODONE HCL PO Take 75 mg by mouth at bedtime as needed.  No current facility-administered medications for this visit.     Allergies:   Ace inhibitors    ROS:     Positive for swelling in his left arm and hand at times.  Positive for anxiety and decreased sleep..   All other systems are reviewed and negative.    PHYSICAL EXAM: VS:  BP 104/70   Pulse 76   Ht 5\' 10"  (1.778 m)   Wt 152 lb (68.9 kg)   BMI 21.81 kg/m  , BMI Body mass index is 21.81 kg/m. GENERAL:  Well appearing NECK:  No jugular venous distention, waveform within normal limits, carotid upstroke brisk and symmetric, no bruits, no thyromegaly LUNGS:  Clear to auscultation bilaterally CHEST:  Unremarkable HEART:  PMI not displaced or sustained,S1 and S2 within normal limits, no S3, no S4, no clicks, no rubs, no murmurs ABD:  Flat, positive bowel sounds normal in frequency in pitch, no  bruits, no rebound, no guarding, no midline pulsatile mass, no hepatomegaly, no splenomegaly EXT:  2 plus pulses throughout, no edema, no cyanosis no clubbing   EKG:  EKG is not ordered today.    Recent Labs: 07/02/2017: ALT 16; BUN 18; Creatinine, Ser 1.31; Hemoglobin 11.6; Platelets 181; Potassium 4.1; Sodium 140    Lipid Panel    Component Value Date/Time   CHOL 148 02/23/2017 1535   TRIG 127.0 02/23/2017 1535   HDL 44.40 02/23/2017 1535   CHOLHDL 3 02/23/2017 1535   VLDL 25.4 02/23/2017 1535   LDLCALC 78 02/23/2017 1535      Wt Readings from Last 3 Encounters:  01/25/18 152 lb (68.9 kg)  01/19/18 155 lb (70.3 kg)  10/18/17 155 lb 12.8 oz (70.7 kg)      Other studies Reviewed: Additional studies/ records that were reviewed today include:   I personally reviewed the echo images and reviewed the results of the [place].   Review of the above records demonstrates: septic left knee .    ASSESSMENT AND PLAN:  CARDIOMYOPATHY:  I reviewed the echo images from August and suggest that they are lower than the 80% reported but certainly better than the 19% reported from [facility].  I personally reviewed the images and thought that there was apical hypokinesis and his ejection fraction was closer to 45%.  Given the symptoms, change in EKG and the markedly abnormal Lexiscan I think cardiac cath is indicated as below.  CAD:   He had a markedly abnormal Lexiscan Myoview.   Cardiac cath is indicated.  The patient understands that risks included but are not limited to stroke (1 in 1000), death (1 in 45), kidney failure [usually temporary] (1 in 500), bleeding (1 in 200), allergic reaction [possibly serious] (1 in 200).  The patient understands and agrees to proceed.    HTN:  Blood pressure is controlled.  No change in therapy pending the results above.    Current medicines are reviewed at length with the patient today.  The patient does not have concerns regarding  medicines.  The following changes have been made:   None  Labs/ tests ordered today include:     Orders Placed This Encounter  Procedures  . Basic Metabolic Panel (BMET)  . CBC  . TSH     Disposition:   FU with me after the cath.    Signed, [name], MD  01/25/2018 2:52 PM    [facility]

## 2018-01-25 ENCOUNTER — Encounter: Payer: Self-pay | Admitting: Cardiology

## 2018-01-25 ENCOUNTER — Ambulatory Visit (INDEPENDENT_AMBULATORY_CARE_PROVIDER_SITE_OTHER): Payer: Medicare Other | Admitting: Cardiology

## 2018-01-25 VITALS — BP 104/70 | HR 76 | Ht 70.0 in | Wt 152.0 lb

## 2018-01-25 DIAGNOSIS — R5383 Other fatigue: Secondary | ICD-10-CM | POA: Diagnosis not present

## 2018-01-25 DIAGNOSIS — I10 Essential (primary) hypertension: Secondary | ICD-10-CM

## 2018-01-25 DIAGNOSIS — R9439 Abnormal result of other cardiovascular function study: Secondary | ICD-10-CM | POA: Diagnosis not present

## 2018-01-25 DIAGNOSIS — I25119 Atherosclerotic heart disease of native coronary artery with unspecified angina pectoris: Secondary | ICD-10-CM

## 2018-01-25 DIAGNOSIS — Z01812 Encounter for preprocedural laboratory examination: Secondary | ICD-10-CM

## 2018-01-25 DIAGNOSIS — Z79899 Other long term (current) drug therapy: Secondary | ICD-10-CM | POA: Diagnosis not present

## 2018-01-25 NOTE — Patient Instructions (Signed)
Medication Instructions:  Continue current medications  If you need a refill on your cardiac medications before your next appointment, please call your pharmacy.  Labwork: Pre Op lab HERE IN OUR OFFICE AT [facility]  Take the provided lab slips with you to the lab for your blood draw.   You will NOT need to fast   If you have labs (blood work) drawn today and your tests are completely normal, you will receive your results only by: Marland Kitchen MyChart Message (if you have MyChart) OR . A paper copy in the mail If you have any lab test that is abnormal or we need to change your treatment, we will call you to review the results.  Testing/Procedures: Your physician has requested that you have a cardiac catheterization. Cardiac catheterization is used to diagnose and/or treat various heart conditions. Doctors may recommend this procedure for a number of different reasons. The most common reason is to evaluate chest pain. Chest pain can be a symptom of coronary artery disease (CAD), and cardiac catheterization can show whether plaque is narrowing or blocking your heart's arteries. This procedure is also used to evaluate the valves, as well as measure the blood flow and oxygen levels in different parts of your heart. For further information please visit [email or web address]. Please follow instruction sheet, as given.  Special Instructions:    [place] [address] Dept: [phone] Loc: [phone]  Ananias E Radilla  [id number]  You are scheduled for a Cardiac Catheterization on Tuesday, December 3 with Dr. Glenetta Hew.  1. Please arrive at the [facility] (Main Entrance A) at [facility]: [address] [place], [place] [address] at 7:00 AM (This time is two hours before your procedure to ensure your preparation). Free valet parking service is available.   Special note: Every  effort is made to have your procedure done on time. Please understand that emergencies sometimes delay scheduled procedures.  2. Diet: Do not eat solid foods after midnight.  The patient may have clear liquids until 5am upon the day of the procedure.  3. Labs: You will need to have blood drawn on Tuesday, November 19 at [place], [place]  Open: [facility] (Lunch 12:30 - 1:30)   Phone: [phone]. You do not need to be fasting.  4. Medication instructions in preparation for your procedure:   Contrast Allergy: No   Stop taking, Cozaar (Losartan) Tuesday, December 3,  On the morning of your procedure, take your Aspirin and any morning medicines NOT listed above.  You may use sips of water.  5. Plan for one night stay--bring personal belongings. 6. Bring a current list of your medications and current insurance cards. 7. You MUST have a responsible person to drive you home. 8. Someone MUST be with you the first 24 hours after you arrive home or your discharge will be delayed. 9. Please wear clothes that are easy to get on and off and wear slip-on shoes.  Thank you for allowing [place] to care for you!   -- [facility]   Follow-Up: . You will need a follow up appointment in After Cath.   At [facility], you and your health needs are our priority.  As part of our continuing mission to provide you with exceptional heart care, we have created designated Provider Care Teams.  These Care Teams include your primary Cardiologist (physician) and  Advanced Practice Providers (APPs -  Physician Assistants and Nurse Practitioners) who all work together to provide you with the care you need, when you need it.   Thank you for choosing [facility] at [facility]!!

## 2018-01-26 LAB — BASIC METABOLIC PANEL
BUN/Creatinine Ratio: 13 (ref 10–24)
BUN: 19 mg/dL (ref 8–27)
CALCIUM: 9.5 mg/dL (ref 8.6–10.2)
CO2: 20 mmol/L (ref 20–29)
Chloride: 106 mmol/L (ref 96–106)
Creatinine, Ser: 1.46 mg/dL — ABNORMAL HIGH (ref 0.76–1.27)
GFR calc Af Amer: 52 mL/min/{1.73_m2} — ABNORMAL LOW (ref >59)
GFR calc non Af Amer: 45 mL/min/{1.73_m2} — ABNORMAL LOW (ref >59)
GLUCOSE: 140 mg/dL — AB (ref 65–99)
POTASSIUM: 4.3 mmol/L (ref 3.5–5.2)
Sodium: 141 mmol/L (ref 134–144)

## 2018-01-26 LAB — TSH: TSH: 1.03 u[IU]/mL (ref 0.450–4.500)

## 2018-01-26 LAB — CBC
HEMOGLOBIN: 11.4 g/dL — AB (ref 13.0–17.7)
Hematocrit: 34.2 % — ABNORMAL LOW (ref 37.5–51.0)
MCH: 31 pg (ref 26.6–33.0)
MCHC: 33.3 g/dL (ref 31.5–35.7)
MCV: 93 fL (ref 79–97)
Platelets: 188 10*3/uL (ref 150–450)
RBC: 3.68 x10E6/uL — AB (ref 4.14–5.80)
RDW: 13.7 % (ref 12.3–15.4)
WBC: 5.3 10*3/uL (ref 3.4–10.8)

## 2018-01-27 ENCOUNTER — Telehealth: Payer: Self-pay | Admitting: *Deleted

## 2018-01-27 NOTE — Telephone Encounter (Signed)
Pt contacted pre-catheterization scheduled at [facility] for: Tuesday February 08, 2018 12 noon Verified arrival time and place: [place] Entrance A at: 7 AM pre-procedure hydration  No solid food after midnight prior to cath, clear liquids until 5 AM day of procedure. Contrast allergy: no Verified no diabetes medications.  Hold: Losartan-day before and day of procedure. Furosemide-day before and day of procedure.   Except hold medications AM meds can be  taken pre-cath with sip of water including: ASA 81 mg  Confirmed patient has responsible person to drive home post procedure and for 24 hours after you arrive home.  I called pt to let him know arrangements have been made for him to have 4 hours IV hydration prior to procedure. I will plan to follow-up with him closer to procedure date.

## 2018-02-07 ENCOUNTER — Other Ambulatory Visit: Payer: Self-pay | Admitting: Oncology

## 2018-02-07 NOTE — Telephone Encounter (Signed)
I reviewed instructions with patient, he verbalized understanding,thanked me for call. 

## 2018-02-08 ENCOUNTER — Other Ambulatory Visit: Payer: Self-pay

## 2018-02-08 ENCOUNTER — Encounter (HOSPITAL_COMMUNITY)
Admission: RE | Disposition: A | Discharge status: Home / Self Care | Payer: Self-pay | Source: Ambulatory Visit | Attending: Cardiology

## 2018-02-08 ENCOUNTER — Encounter (HOSPITAL_COMMUNITY): Payer: Self-pay | Admitting: Interventional Cardiology

## 2018-02-08 ENCOUNTER — Inpatient Hospital Stay (HOSPITAL_COMMUNITY)
Admission: RE | Admit: 2018-02-08 | Discharge: 2018-02-11 | DRG: 286 | Disposition: A | Discharge status: Home / Self Care | Payer: Medicare Other | Source: Ambulatory Visit | Attending: Cardiology | Admitting: Cardiology

## 2018-02-08 DIAGNOSIS — Z79899 Other long term (current) drug therapy: Secondary | ICD-10-CM

## 2018-02-08 DIAGNOSIS — I208 Other forms of angina pectoris: Secondary | ICD-10-CM | POA: Diagnosis not present

## 2018-02-08 DIAGNOSIS — I517 Cardiomegaly: Secondary | ICD-10-CM | POA: Diagnosis not present

## 2018-02-08 DIAGNOSIS — I361 Nonrheumatic tricuspid (valve) insufficiency: Secondary | ICD-10-CM | POA: Diagnosis not present

## 2018-02-08 DIAGNOSIS — I25118 Atherosclerotic heart disease of native coronary artery with other forms of angina pectoris: Secondary | ICD-10-CM | POA: Diagnosis not present

## 2018-02-08 DIAGNOSIS — I428 Other cardiomyopathies: Secondary | ICD-10-CM | POA: Diagnosis present

## 2018-02-08 DIAGNOSIS — Z01810 Encounter for preprocedural cardiovascular examination: Secondary | ICD-10-CM | POA: Diagnosis not present

## 2018-02-08 DIAGNOSIS — I130 Hypertensive heart and chronic kidney disease with heart failure and stage 1 through stage 4 chronic kidney disease, or unspecified chronic kidney disease: Secondary | ICD-10-CM | POA: Diagnosis present

## 2018-02-08 DIAGNOSIS — I5020 Unspecified systolic (congestive) heart failure: Secondary | ICD-10-CM | POA: Diagnosis present

## 2018-02-08 DIAGNOSIS — Z8249 Family history of ischemic heart disease and other diseases of the circulatory system: Secondary | ICD-10-CM | POA: Diagnosis not present

## 2018-02-08 DIAGNOSIS — I2089 Other forms of angina pectoris: Secondary | ICD-10-CM

## 2018-02-08 DIAGNOSIS — Z888 Allergy status to other drugs, medicaments and biological substances status: Secondary | ICD-10-CM

## 2018-02-08 DIAGNOSIS — Z77098 Contact with and (suspected) exposure to other hazardous, chiefly nonmedicinal, chemicals: Secondary | ICD-10-CM | POA: Diagnosis present

## 2018-02-08 DIAGNOSIS — Z655 Exposure to disaster, war and other hostilities: Secondary | ICD-10-CM

## 2018-02-08 DIAGNOSIS — I5023 Acute on chronic systolic (congestive) heart failure: Secondary | ICD-10-CM | POA: Diagnosis not present

## 2018-02-08 DIAGNOSIS — I252 Old myocardial infarction: Secondary | ICD-10-CM | POA: Diagnosis not present

## 2018-02-08 DIAGNOSIS — I340 Nonrheumatic mitral (valve) insufficiency: Secondary | ICD-10-CM | POA: Diagnosis not present

## 2018-02-08 DIAGNOSIS — N183 Chronic kidney disease, stage 3 (moderate): Secondary | ICD-10-CM | POA: Diagnosis present

## 2018-02-08 DIAGNOSIS — I25119 Atherosclerotic heart disease of native coronary artery with unspecified angina pectoris: Secondary | ICD-10-CM | POA: Diagnosis not present

## 2018-02-08 DIAGNOSIS — Z23 Encounter for immunization: Secondary | ICD-10-CM | POA: Diagnosis not present

## 2018-02-08 DIAGNOSIS — N179 Acute kidney failure, unspecified: Secondary | ICD-10-CM | POA: Diagnosis present

## 2018-02-08 DIAGNOSIS — I370 Nonrheumatic pulmonary valve stenosis: Secondary | ICD-10-CM | POA: Diagnosis not present

## 2018-02-08 DIAGNOSIS — E785 Hyperlipidemia, unspecified: Secondary | ICD-10-CM | POA: Diagnosis present

## 2018-02-08 DIAGNOSIS — Z7982 Long term (current) use of aspirin: Secondary | ICD-10-CM | POA: Diagnosis not present

## 2018-02-08 DIAGNOSIS — I2510 Atherosclerotic heart disease of native coronary artery without angina pectoris: Secondary | ICD-10-CM | POA: Diagnosis not present

## 2018-02-08 DIAGNOSIS — I5021 Acute systolic (congestive) heart failure: Secondary | ICD-10-CM | POA: Diagnosis not present

## 2018-02-08 DIAGNOSIS — I5022 Chronic systolic (congestive) heart failure: Secondary | ICD-10-CM | POA: Diagnosis not present

## 2018-02-08 HISTORY — PX: RIGHT/LEFT HEART CATH AND CORONARY ANGIOGRAPHY: CATH118266

## 2018-02-08 HISTORY — DX: Unspecified systolic (congestive) heart failure: I50.20

## 2018-02-08 LAB — BASIC METABOLIC PANEL
Anion gap: 6 (ref 5–15)
BUN: 18 mg/dL (ref 8–23)
CO2: 24 mmol/L (ref 22–32)
Calcium: 8.9 mg/dL (ref 8.9–10.3)
Chloride: 112 mmol/L — ABNORMAL HIGH (ref 98–111)
Creatinine, Ser: 1.47 mg/dL — ABNORMAL HIGH (ref 0.61–1.24)
GFR calc Af Amer: 52 mL/min — ABNORMAL LOW (ref >60)
GFR calc non Af Amer: 45 mL/min — ABNORMAL LOW (ref >60)
Glucose, Bld: 131 mg/dL — ABNORMAL HIGH (ref 70–99)
POTASSIUM: 4.4 mmol/L (ref 3.5–5.1)
Sodium: 142 mmol/L (ref 135–145)

## 2018-02-08 LAB — POCT I-STAT 3, VENOUS BLOOD GAS (G3P V)
Acid-base deficit: 3 mmol/L — ABNORMAL HIGH (ref 0.0–2.0)
Acid-base deficit: 3 mmol/L — ABNORMAL HIGH (ref 0.0–2.0)
Bicarbonate: 23 mmol/L (ref 20.0–28.0)
Bicarbonate: 23.3 mmol/L (ref 20.0–28.0)
O2 SAT: 63 %
O2 Saturation: 64 %
PCO2 VEN: 45.3 mmHg (ref 44.0–60.0)
PO2 VEN: 36 mmHg (ref 32.0–45.0)
TCO2: 24 mmol/L (ref 22–32)
TCO2: 25 mmol/L (ref 22–32)
pCO2, Ven: 45.2 mmHg (ref 44.0–60.0)
pH, Ven: 7.315 (ref 7.250–7.430)
pH, Ven: 7.32 (ref 7.250–7.430)
pO2, Ven: 36 mmHg (ref 32.0–45.0)

## 2018-02-08 LAB — POCT I-STAT 3, ART BLOOD GAS (G3+)
Acid-base deficit: 3 mmol/L — ABNORMAL HIGH (ref 0.0–2.0)
Bicarbonate: 23.3 mmol/L (ref 20.0–28.0)
O2 Saturation: 96 %
PCO2 ART: 48 mmHg (ref 32.0–48.0)
PH ART: 7.294 — AB (ref 7.350–7.450)
TCO2: 25 mmol/L (ref 22–32)
pO2, Arterial: 94 mmHg (ref 83.0–108.0)

## 2018-02-08 SURGERY — RIGHT/LEFT HEART CATH AND CORONARY ANGIOGRAPHY
Anesthesia: LOCAL

## 2018-02-08 MED ORDER — CARVEDILOL 3.125 MG PO TABS: 3.1250 mg | ORAL_TABLET | Freq: Two times a day (BID) | ORAL | Status: DC

## 2018-02-08 MED ORDER — SODIUM CHLORIDE 0.9 % IV SOLN: 250.0000 mL | INTRAVENOUS | Status: DC | PRN

## 2018-02-08 MED ORDER — LATANOPROST 0.005 % OP SOLN
1.0000 [drp] | Freq: Every day | OPHTHALMIC | Status: DC
  Administered 2018-02-08 – 2018-02-10 (×3): 1 [drp] via OPHTHALMIC
  Filled 2018-02-08: qty 2.5

## 2018-02-08 MED ORDER — SPIRONOLACTONE 12.5 MG HALF TABLET
12.5000 mg | ORAL_TABLET | Freq: Every day | ORAL | Status: DC
  Administered 2018-02-08 – 2018-02-10 (×3): 12.5 mg via ORAL
  Filled 2018-02-08 (×3): qty 1

## 2018-02-08 MED ORDER — HEPARIN (PORCINE) IN NACL 1000-0.9 UT/500ML-% IV SOLN
INTRAVENOUS | Status: AC
  Filled 2018-02-08: qty 1000

## 2018-02-08 MED ORDER — MIDAZOLAM HCL 2 MG/2ML IJ SOLN
INTRAMUSCULAR | Status: DC | PRN
  Administered 2018-02-08: 1 mg via INTRAVENOUS

## 2018-02-08 MED ORDER — ACETAMINOPHEN 325 MG PO TABS: 650.0000 mg | ORAL_TABLET | ORAL | Status: DC | PRN

## 2018-02-08 MED ORDER — SODIUM CHLORIDE 0.9% FLUSH: 3.0000 mL | Freq: Two times a day (BID) | INTRAVENOUS | Status: DC

## 2018-02-08 MED ORDER — SODIUM CHLORIDE 0.9% FLUSH
3.0000 mL | Freq: Two times a day (BID) | INTRAVENOUS | Status: DC
  Administered 2018-02-08 – 2018-02-11 (×7): 3 mL via INTRAVENOUS

## 2018-02-08 MED ORDER — VERAPAMIL HCL 2.5 MG/ML IV SOLN
INTRAVENOUS | Status: DC | PRN
  Administered 2018-02-08: 10 mL via INTRA_ARTERIAL

## 2018-02-08 MED ORDER — SODIUM CHLORIDE 0.9% FLUSH: 3.0000 mL | INTRAVENOUS | Status: DC | PRN

## 2018-02-08 MED ORDER — PRAVASTATIN SODIUM 80 MG PO TABS: 80.0000 mg | ORAL_TABLET | Freq: Every day | ORAL | Status: DC

## 2018-02-08 MED ORDER — ENOXAPARIN SODIUM 40 MG/0.4ML ~~LOC~~ SOLN: 40.0000 mg | SUBCUTANEOUS | Status: DC

## 2018-02-08 MED ORDER — LOSARTAN POTASSIUM 25 MG PO TABS
25.0000 mg | ORAL_TABLET | Freq: Every day | ORAL | Status: DC
  Administered 2018-02-08 – 2018-02-10 (×3): 25 mg via ORAL
  Filled 2018-02-08 (×3): qty 1

## 2018-02-08 MED ORDER — HEPARIN SODIUM (PORCINE) 1000 UNIT/ML IJ SOLN
INTRAMUSCULAR | Status: AC
  Filled 2018-02-08: qty 1

## 2018-02-08 MED ORDER — FENTANYL CITRATE (PF) 100 MCG/2ML IJ SOLN
INTRAMUSCULAR | Status: AC
  Filled 2018-02-08: qty 2

## 2018-02-08 MED ORDER — ACETAMINOPHEN 325 MG PO TABS
650.0000 mg | ORAL_TABLET | ORAL | Status: DC | PRN
  Filled 2018-02-08: qty 2

## 2018-02-08 MED ORDER — HEPARIN SODIUM (PORCINE) 1000 UNIT/ML IJ SOLN
INTRAMUSCULAR | Status: DC | PRN
  Administered 2018-02-08: 3500 [IU] via INTRAVENOUS

## 2018-02-08 MED ORDER — ROSUVASTATIN CALCIUM 20 MG PO TABS
40.0000 mg | ORAL_TABLET | Freq: Every day | ORAL | Status: DC
  Administered 2018-02-08 – 2018-02-10 (×3): 40 mg via ORAL
  Filled 2018-02-08 (×2): qty 2
  Filled 2018-02-08: qty 1
  Filled 2018-02-08 (×4): qty 2

## 2018-02-08 MED ORDER — LIDOCAINE HCL (PF) 1 % IJ SOLN
INTRAMUSCULAR | Status: AC
  Filled 2018-02-08: qty 30

## 2018-02-08 MED ORDER — FUROSEMIDE 10 MG/ML IJ SOLN
40.0000 mg | Freq: Every day | INTRAMUSCULAR | Status: DC
  Administered 2018-02-08 – 2018-02-09 (×2): 40 mg via INTRAVENOUS
  Filled 2018-02-08 (×2): qty 4

## 2018-02-08 MED ORDER — ONDANSETRON HCL 4 MG/2ML IJ SOLN: 4.0000 mg | Freq: Four times a day (QID) | INTRAMUSCULAR | Status: DC | PRN

## 2018-02-08 MED ORDER — FENTANYL CITRATE (PF) 100 MCG/2ML IJ SOLN
INTRAMUSCULAR | Status: DC | PRN
  Administered 2018-02-08: 25 ug via INTRAVENOUS

## 2018-02-08 MED ORDER — VERAPAMIL HCL 2.5 MG/ML IV SOLN
INTRAVENOUS | Status: AC
  Filled 2018-02-08: qty 2

## 2018-02-08 MED ORDER — ASPIRIN 81 MG PO CHEW: 81.0000 mg | CHEWABLE_TABLET | ORAL | Status: DC

## 2018-02-08 MED ORDER — TRAZODONE HCL 50 MG PO TABS
75.0000 mg | ORAL_TABLET | Freq: Every evening | ORAL | Status: DC | PRN
  Administered 2018-02-09: 75 mg via ORAL
  Filled 2018-02-08 (×2): qty 1

## 2018-02-08 MED ORDER — FERROUS GLUCONATE 324 (38 FE) MG PO TABS
324.0000 mg | ORAL_TABLET | Freq: Two times a day (BID) | ORAL | Status: DC
  Administered 2018-02-09 – 2018-02-11 (×5): 324 mg via ORAL
  Filled 2018-02-08 (×6): qty 1

## 2018-02-08 MED ORDER — LIDOCAINE HCL (PF) 1 % IJ SOLN
INTRAMUSCULAR | Status: DC | PRN
  Administered 2018-02-08: 3 mL
  Administered 2018-02-08: 2 mL

## 2018-02-08 MED ORDER — ASPIRIN EC 81 MG PO TBEC
81.0000 mg | DELAYED_RELEASE_TABLET | Freq: Every day | ORAL | Status: DC
  Administered 2018-02-09 – 2018-02-11 (×3): 81 mg via ORAL
  Filled 2018-02-08 (×3): qty 1

## 2018-02-08 MED ORDER — HEPARIN (PORCINE) IN NACL 1000-0.9 UT/500ML-% IV SOLN
INTRAVENOUS | Status: DC | PRN
  Administered 2018-02-08: 500 mL

## 2018-02-08 MED ORDER — MIDAZOLAM HCL 2 MG/2ML IJ SOLN
INTRAMUSCULAR | Status: AC
  Filled 2018-02-08: qty 2

## 2018-02-08 MED ORDER — SODIUM CHLORIDE 0.9 % WEIGHT BASED INFUSION
3.0000 mL/kg/h | INTRAVENOUS | Status: DC
  Administered 2018-02-08: 3 mL/kg/h via INTRAVENOUS

## 2018-02-08 MED ORDER — ENSURE ENLIVE PO LIQD
237.0000 mL | Freq: Three times a day (TID) | ORAL | Status: DC
  Administered 2018-02-08 – 2018-02-11 (×8): 237 mL via ORAL

## 2018-02-08 MED ORDER — ASPIRIN 81 MG PO TABS: 81.0000 mg | ORAL_TABLET | Freq: Two times a day (BID) | ORAL | Status: DC

## 2018-02-08 MED ORDER — HEPARIN SODIUM (PORCINE) 5000 UNIT/ML IJ SOLN
5000.0000 [IU] | Freq: Three times a day (TID) | INTRAMUSCULAR | Status: DC
  Administered 2018-02-08 – 2018-02-11 (×8): 5000 [IU] via SUBCUTANEOUS
  Filled 2018-02-08 (×7): qty 1

## 2018-02-08 MED ORDER — CARVEDILOL 3.125 MG PO TABS
3.1250 mg | ORAL_TABLET | Freq: Two times a day (BID) | ORAL | Status: DC
  Administered 2018-02-08 – 2018-02-11 (×6): 3.125 mg via ORAL
  Filled 2018-02-08 (×6): qty 1

## 2018-02-08 MED ORDER — IOHEXOL 350 MG/ML SOLN
INTRAVENOUS | Status: DC | PRN
  Administered 2018-02-08: 50 mL via INTRA_ARTERIAL

## 2018-02-08 MED ORDER — SODIUM CHLORIDE 0.9 % WEIGHT BASED INFUSION: 1.0000 mL/kg/h | INTRAVENOUS | Status: DC

## 2018-02-08 MED ORDER — FERROUS GLUCONATE 324 (37.5 FE) MG PO TABS: 1.0000 | ORAL_TABLET | Freq: Two times a day (BID) | ORAL | Status: DC

## 2018-02-08 MED ORDER — LOSARTAN POTASSIUM 25 MG PO TABS: 25.0000 mg | ORAL_TABLET | Freq: Every day | ORAL | Status: DC

## 2018-02-08 SURGICAL SUPPLY — 14 items
CATH 5FR JL3.5 JR4 ANG PIG MP (CATHETERS) ×1 IMPLANT
CATH BALLN WEDGE 5F 110CM (CATHETERS) ×1 IMPLANT
DEVICE RAD COMP TR BAND LRG (VASCULAR PRODUCTS) ×1 IMPLANT
GLIDESHEATH SLEND SS 6F .021 (SHEATH) ×1 IMPLANT
GUIDEWIRE .025 260CM (WIRE) ×1 IMPLANT
GUIDEWIRE INQWIRE 1.5J.035X260 (WIRE) IMPLANT
INQWIRE 1.5J .035X260CM (WIRE) ×2
KIT HEART LEFT (KITS) ×2 IMPLANT
PACK CARDIAC CATHETERIZATION (CUSTOM PROCEDURE TRAY) ×2 IMPLANT
SHEATH GLIDE SLENDER 4/5FR (SHEATH) ×1 IMPLANT
SHEATH PROBE COVER 6X72 (BAG) ×1 IMPLANT
TRANSDUCER W/STOPCOCK (MISCELLANEOUS) ×2 IMPLANT
TUBING CIL FLEX 10 FLL-RA (TUBING) ×2 IMPLANT
WIRE HI TORQ VERSACORE-J 145CM (WIRE) ×1 IMPLANT

## 2018-02-08 NOTE — Progress Notes (Signed)
  Pt admitted to the unit. Pt is stable, alert and oriented per baseline. Oriented to room, staff, and call bell. Educated to call for any assistance. Bed in lowest position, call bell within reach- will continue to monitor. 

## 2018-02-08 NOTE — Progress Notes (Signed)
TR BAND REMOVAL  LOCATION:right    radial  DEFLATED PER PROTOCOL:     TIME BAND OFF / DRESSING APPLIED:    1615  SITE UPON ARRIVAL:    Level 0  SITE AFTER BAND REMOVAL:    Level 0  CIRCULATION SENSATION AND MOVEMENT:    Within Normal Limits : yes  COMMENTS:

## 2018-02-08 NOTE — Consult Note (Addendum)
Advanced Heart Failure Team Consult Note   Primary Physician: Leamon Arnt, MD PCP-Cardiologist: Dr Percival Spanish Reason for Consultation: acute systolic HF  HPI:    Luis Chen is seen today for evaluation of acute systolic HF at the request of Dr Irish Lack.   Luis Chen is a 77 y.o. male with a history of systolic HF with improved EF from 25% in 2010 -> 50% 2011 due to NICM, CAD, CKD, HL, and HTN.  He has been followed by Dr Percival Spanish since at least 2010. At appointment in August, he said that he was having intermittent CP at rest that was resolved by apple cider vinegar. He was able to exercise without any reproducible pain. SBP running in 150s. EKG that day showed new TWI in lateral leads.   Repeat echo was ordered, which showed EF ~45%. He was also set up for stress test, which showed marked abnormalities and read EF at 16%. Dr Percival Spanish set up him up for [facility] due to EKG changes and abnormal stress test.   LHC completed today and showed multivessel disease, with recommendation for CABG. EF ~25%. HF team consulted to optimize prior to potential surgery.   He tells me that he has been having CP that occurs at night about once every two weeks to once/month. It feels more like reflux or constipation and has been resolving with apple cider vinegar. He was not very concerned about it. It never occur with activity. Denies SOB, orthopnea, PND. Has occasional ankle edema that comes and goes. Takes 20 mg lasix daily at home. Does not miss medications. Weight has been trending down at home 162 > 150 lbs.   FH: CAD in mother and brother.  SH: No tobacco use. Exposure to [place] in [place] and has 70% disability. Occasional ETOH use. No drug use. Lives with his grandson in [place]. Works part time at [employer].  LHC 02/08/18:  Mid LAD lesion is 90% stenosed.  Mid LM to Dist LM lesion is 60% stenosed.  Ost LAD lesion is 70% stenosed.  Ost Cx to Prox Cx lesion is 50%  stenosed.  Mid Cx lesion is 25% stenosed.  Mid RCA lesion is 100% stenosed. Distal vessel fills by brisk left to right and right toright collaterals.  There is severe left ventricular systolic dysfunction.  The left ventricular ejection fraction is less than 25% by visual estimate.  LV end diastolic pressure is moderately elevated.  [place] 02/08/18: RA mean 4 RV 41/6 PA 37/16 (24) PCWP 23 AO 109/60 (80) Cardiac Output (Fick) 4.83 Cardiac Index (Fick) 2.6  Lexiscan 01/2018:  The left ventricular ejection fraction is severely decreased (<30%).  Nuclear stress EF: 16%.  There was no ST segment deviation noted during stress.  Defect 1: There is a medium defect of severe severity present in the mid inferoseptal, mid inferior, apical septal and apical inferior location.  Defect 2: There is a medium defect of severe severity present in the mid anterior, mid anteroseptal, apical anterior and apex location.  Defect 3: There is a small defect of moderate severity present in the mid anterolateral and apical lateral location.  Findings consistent with prior myocardial infarction with mild peri-infarct ischemia in the anteroseptal region.  This is a high risk study  Echo 10/2017: Read as 55-60%, but thought to be more ~45% per Dr Percival Spanish Echo 11/2011: EF 55-60% Echo 02/2010: EF 50% Echo 10/2008: EF 20-25%, grade 2 DD  LHC 10/2008 with nonobstructive CAD "The left main  was normal.  The LAD had proximal long     25% stenosis, terminating as a focal 50% proximal to mid lesion.     First and second diagonals were small and normal.  The third     diagonal was moderate size and normal.  The circumflex in the     proximal AV groove had luminal irregularities.  There was a large     mid obtuse marginal which was branching.  There was scattered     luminal irregularities.  The inferior branch did supply some septal     perforators.  There was a PDA which is very small and normal.  The      right coronary artery was a small, codominant vessel.  It was very     narrow in caliber, being less than 1.5 mm.  There was proximal 95%     stenosis and a long mid 95% stenosis)"  Review of Systems: [y] = yes, [ ]  = no   General: Weight gain [ ] ; Weight loss Blue.Reese ]; Anorexia [ ] ; Fatigue [ ] ; Fever [ ] ; Chills [ ] ; Weakness [ ]   Cardiac: Chest pain/pressure Blue.Reese ]; Resting SOB [ ] ; Exertional SOB [ ] ; Orthopnea [ ] ; Pedal Edema [ ] ; Palpitations [ ] ; Syncope [ ] ; Presyncope [ ] ; Paroxysmal nocturnal dyspnea[ ]   Pulmonary: Cough [ ] ; Wheezing[ ] ; Hemoptysis[ ] ; Sputum [ ] ; Snoring [ ]   GI: Vomiting[ ] ; Dysphagia[ ] ; Melena[ ] ; Hematochezia [ ] ; Heartburn[ ] ; Abdominal pain [ ] ; Constipation [ ] ; Diarrhea [ ] ; BRBPR [ ]   GU: Hematuria[ ] ; Dysuria [ ] ; Nocturia[ ]   Vascular: Pain in legs with walking [ ] ; Pain in feet with lying flat [ ] ; Non-healing sores [ ] ; Stroke [ ] ; TIA [ ] ; Slurred speech [ ] ;  Neuro: Headaches[ ] ; Vertigo[ ] ; Seizures[ ] ; Paresthesias[ ] ;Blurred vision [ ] ; Diplopia [ ] ; Vision changes [ ]   Ortho/Skin: Arthritis Blue.Reese ]; Joint pain [ y]; Muscle pain [ ] ; Joint swelling Blue.Reese ]; Back Pain [ ] ; Rash [ ]   Psych: Depression[ ] ; Anxiety[ ]   Heme: Bleeding problems [ ] ; Clotting disorders [ ] ; Anemia [ ]   Endocrine: Diabetes [ ] ; Thyroid dysfunction[ ]   Home Medications Prior to Admission medications   Medication Sig Start Date End Date Taking? Authorizing Provider  aspirin 81 MG tablet Take 81 mg by mouth 2 (two) times daily.    Yes [provider]  carvedilol (COREG) 3.125 MG tablet Take 1 tablet (3.125 mg total) by mouth 2 (two) times daily with a meal. 08/04/17  Yes Leamon Arnt, MD  furosemide (LASIX) 40 MG tablet Take 0.5 tablets (20 mg total) by mouth daily as needed for fluid or edema (for weight gain of more than 3 Lbs in 1 day). 07/06/15  Yes Lavina Hamman, MD  latanoprost (XALATAN) 0.005 % ophthalmic solution Place 1 drop into both eyes at bedtime.  01/22/17   Yes [provider]  losartan (COZAAR) 25 MG tablet Take 25 mg by mouth daily.  05/13/15  Yes [provider]  pravastatin (PRAVACHOL) 80 MG tablet Take 80 mg by mouth daily. 07/26/15  Yes [provider]  traZODone (DESYREL) 50 MG tablet Take 75 mg by mouth at bedtime as needed for sleep.   Yes [provider]  trolamine salicylate (ASPERCREME) 10 % cream Apply 1 application topically 3 (three) times daily as needed (for arthritis pain.).   Yes [provider]  feeding supplement,  ENSURE ENLIVE, (ENSURE ENLIVE) LIQD Take 237 mLs by mouth 3 (three) times daily between meals. Patient taking differently: Take 237 mLs by mouth daily as needed (for nutritional support).  07/06/15   Lavina Hamman, MD  Ferrous Gluconate 324 (37.5 Fe) MG TABS TAKE 1 TABLET BY MOUTH TWICE DAILY WITH MEALS 02/08/18   Wyatt Portela, MD    Past Medical History: Past Medical History:  Diagnosis Date  . CAD (coronary artery disease)    non obstructive. Left main normal. LAd proximal long 25% stenosis, termingating as focal 50% prox to mid lesion. First & second diag were small, normal. Circumflex in proximal av groove had luminal irregularities.. Was large mid obtuse marg which was branching, scattered luminal irregul. Infer branch did supply some septal perforators. Was PDA,small & normal.  < 1.70mm. There was prox 95% stenosis  . Cardiomyopathy    EF was 25% improved to 50%.   . CHF (congestive heart failure) ([place])   . CKD (chronic kidney disease)   . Hyperlipidemia    transient history, controlled on diet  . Hypertension     Past Surgical History: Past Surgical History:  Procedure Laterality Date  . KNEE ARTHROSCOPY Left 07/01/2015   Procedure: Irrigation and debridement left knee arthroscopy;  Surgeon: Renette Butters, MD;  Location: [facility];  Service: Orthopedics;  Laterality: Left;    Family History: Family History  Problem Relation Age of Onset  . Diabetes Mother    . Arthritis Mother   . Heart disease Mother   . Hyperlipidemia Mother   . Hypertension Mother   . Heart attack Mother 45  . Heart disease Brother        had in his 3s. Had CABG x2  . Hypothyroidism Daughter   . Stroke Daughter     Social History: Social History   Socioeconomic History  . Marital status: Widowed    Spouse name: Not on file  . Number of children: Not on file  . Years of education: Not on file  . Highest education level: Not on file  Occupational History  . Not on file  Social Needs  . Financial resource strain: Not on file  . Food insecurity:    Worry: Not on file    Inability: Not on file  . Transportation needs:    Medical: Not on file    Non-medical: Not on file  Tobacco Use  . Smoking status: Never Smoker  . Smokeless tobacco: Never Used  Substance and Sexual Activity  . Alcohol use: Yes    Comment: one glass of wine or beer occassionaly  . Drug use: No  . Sexual activity: Yes  Lifestyle  . Physical activity:    Days per week: 4 days    Minutes per session: 30 min  . Stress: Not on file  Relationships  . Social connections:    Talks on phone: Not on file    Gets together: Not on file    Attends religious service: Not on file    Active member of club or organization: Not on file    Attends meetings of clubs or organizations: Not on file    Relationship status: Not on file  Other Topics Concern  . Not on file  Social History Narrative   Lives with grandson (69 yo), wife passed on June 27, 2000. 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     Allergies:  Allergies  Allergen Reactions  .  Ace Inhibitors Swelling    Facial swelling     Objective:    Vital Signs:   Temp:  [97.6 F (36.4 C)] 97.6 F (36.4 C) (12/03 0706) Pulse Rate:  [65-100] 70 (12/03 1343) Resp:  [8-36] 36 (12/03 1338) BP: (110-131)/(66-86) 113/76 (12/03 1343) SpO2:  [99 %-100 %] 100 % (12/03 1343) Weight:  [68.9 kg]  68.9 kg (12/03 0706)    Weight change: Filed Weights   02/08/18 0706  Weight: 68.9 kg    Intake/Output:  No intake or output data in the 24 hours ending 02/08/18 1354    Physical Exam    General:  No resp difficulty HEENT: poor dentation Neck: supple. JVP 6-7. Carotids 2+ bilat; no bruits. No lymphadenopathy or thyromegaly appreciated. Cor: PMI nondisplaced. Regular rate & rhythm. No rubs, gallops or murmurs. Lungs: fine crackles in bases. Abdomen: soft, nontender, nondistended. No hepatosplenomegaly. No bruits or masses. Good bowel sounds. Extremities: no cyanosis, clubbing, rash, edema Neuro: alert & orientedx3, cranial nerves grossly intact. moves all 4 extremities w/o difficulty. Affect pleasant   Telemetry   NSR 80s. Personally reviewed.   EKG    NSR 70 bpm with TWI in I, II, aVL, V4-V6. Personally reviewed.   Labs   Basic Metabolic Panel: No results for input(s): NA, K, CL, CO2, GLUCOSE, BUN, CREATININE, CALCIUM, MG, PHOS in the last 168 hours.  Liver Function Tests: No results for input(s): AST, ALT, ALKPHOS, BILITOT, PROT, ALBUMIN in the last 168 hours. No results for input(s): LIPASE, AMYLASE in the last 168 hours. No results for input(s): AMMONIA in the last 168 hours.  CBC: No results for input(s): WBC, NEUTROABS, HGB, HCT, MCV, PLT in the last 168 hours.  Cardiac Enzymes: No results for input(s): CKTOTAL, CKMB, CKMBINDEX, TROPONINI in the last 168 hours.  BNP: BNP (last 3 results) No results for input(s): BNP in the last 8760 hours.  ProBNP (last 3 results) No results for input(s): PROBNP in the last 8760 hours.   CBG: No results for input(s): GLUCAP in the last 168 hours.  Coagulation Studies: No results for input(s): LABPROT, INR in the last 72 hours.   Imaging    No results found.   Medications:     Current Medications: . aspirin  81 mg Oral Pre-Cath  . sodium chloride flush  3 mL Intravenous Q12H     Infusions: . sodium  chloride    . sodium chloride 1 mL/kg/hr (02/08/18 0845)       Patient Profile   Luis Chen is a 77 y.o. male with a history of systolic HF with improved EF from 25% in 2010 -> 50% 2011 due to NICM, nonobstructive CAD, CKD, HL, and HTN.  Had [place] today, which showed severe multivessel disease with normal output and EF < 25%. HF team consulted to optimize prior to CABG.   Assessment/Plan   1. Acute systolic HF due to ICM - Had a reduced EF back in 10/2008 at 20-25%. LHC at that time showed nonobstructive CAD. EF had improved to 55-60% by 02/2010. - Echo 10/2017 EF ~45% per Dr Percival Spanish (read as 55-60%), down from 55-60% in 2013.  - Volume elevated based on cath today with PCWP 23. CO is normal. He takes lasix 20 mg daily at home.  - Start lasix 40 mg IV daily.  - Continue losartan 25 mg daily. Allergic to lisinopril with facial swelling, so cannot use Entresto - Continue coreg 3.125 mg daily - Add spiro 12.5 mg daily if  BMET looks okay.   2. Severe multivessel CAD - Surgery to consult for possible CABG - Continue ASA (he was taking BID prophylactically, but will change to daily) and BB - Change home pravastatin to a higher intensity statin. Check lipid panel in am.   3. HTN - Will manage with HF medications. SBP 130s  4. CKD - Baseline looks to be 1.2-1.4 - Follow creatinine closely with diuresis. Check BMET today.   Medication concerns reviewed with patient and pharmacy team. Barriers identified: none at this time.  Length of Stay: [place], NP  02/08/2018, 1:54 PM  Advanced Heart Failure Team Pager [phone] (M-F; 7a - 4p)  Please contact [facility] for night-coverage after hours (4p -7a ) and weekends on [email or web address]  Patient seen with NP, agree with the above note.   Patient had cath today, showing severe 3 vessel disease.  On RHC, mildly elevated PCWP with preserved cardiac output.  EF < 25% on LV-gram.  Despite low EF and severe CAD, his symptoms have  been fairly minimal.  He feels an occasional "queasy" feeling in his upper abdomen with exertion that may be an anginal equivalent.  ECG is concerning for possible old anteroseptal and old inferior MIs.    On exam, JVP about 8 cm.  Heart regular S1S2, no murmur.  No edema.   1. CAD: Severe 3 vessel disease with occluded RCA.  He has relatively mild anginal symptoms.   - Agree that CABG is best option here, have consulted TCTS.  - Change statin to Crestor 40 daily and continue ASA 81 daily.  2. Acute on chronic systolic CHF: EF 89% on Cardiolite, <25% on LV-gram.  This is down from around 45% on echo in 8/19 (officially read as having normal EF but thought to be mildly abnormal at that time).  Ischemic cardiomyopathy.  PCWP was mildly elevated on RHC today, cardiac output was preserved.  - Can restart Coreg 3.125 mg bid and losartan 25 mg daily.  H/o angioedema with ACEI so not candidate for Entresto.  - Add spironolactone 12.5 mg daily.  - Will give 1 dose of Lasix 40 mg IV now, likely back to po Lasix tomorrow.  Follow creatinine closely.  - Will get formal echo.  - As above, would be best served with CABG.  Will reassess after revascularization to determine ICD need (narrow QRS, not CRT candidate).   Loralie Champagne 02/08/2018 4:33 PM

## 2018-02-08 NOTE — Interval H&P Note (Signed)
Cath Lab Visit (complete for each Cath Lab visit)  Clinical Evaluation Leading to the Procedure:   ACS: No.  Non-ACS:    Anginal Classification: CCS III  Anti-ischemic medical therapy: Minimal Therapy (1 class of medications)  Non-Invasive Test Results: High-risk stress test findings: cardiac mortality >3%/year  Prior CABG: No previous CABG   Decreased LVEF.   History and Physical Interval Note:  02/08/2018 27:51 PM  Luis Chen  has presented today for surgery, with the diagnosis of as  The various methods of treatment have been discussed with the patient and family. After consideration of risks, benefits and other options for treatment, the patient has consented to  Procedure(s): LEFT HEART CATH AND CORONARY ANGIOGRAPHY (N/A) as a surgical intervention .  The patient's history has been reviewed, patient examined, no change in status, stable for surgery.  I have reviewed the patient's chart and labs.  Questions were answered to the patient's satisfaction.     Larae Grooms

## 2018-02-09 ENCOUNTER — Inpatient Hospital Stay (HOSPITAL_COMMUNITY): Payer: Medicare Other

## 2018-02-09 DIAGNOSIS — I370 Nonrheumatic pulmonary valve stenosis: Secondary | ICD-10-CM

## 2018-02-09 DIAGNOSIS — I5022 Chronic systolic (congestive) heart failure: Secondary | ICD-10-CM

## 2018-02-09 DIAGNOSIS — I2510 Atherosclerotic heart disease of native coronary artery without angina pectoris: Secondary | ICD-10-CM

## 2018-02-09 LAB — LIPID PANEL
Cholesterol: 171 mg/dL (ref 0–200)
HDL: 49 mg/dL (ref >40)
LDL Cholesterol: 109 mg/dL — ABNORMAL HIGH (ref 0–99)
Total CHOL/HDL Ratio: 3.5 RATIO
Triglycerides: 64 mg/dL (ref <150)
VLDL: 13 mg/dL (ref 0–40)

## 2018-02-09 LAB — ECHOCARDIOGRAM COMPLETE
Height: 70 in
Weight: 2289.6 oz

## 2018-02-09 LAB — BASIC METABOLIC PANEL
ANION GAP: 13 (ref 5–15)
BUN: 28 mg/dL — ABNORMAL HIGH (ref 8–23)
CHLORIDE: 103 mmol/L (ref 98–111)
CO2: 24 mmol/L (ref 22–32)
Calcium: 9.7 mg/dL (ref 8.9–10.3)
Creatinine, Ser: 1.51 mg/dL — ABNORMAL HIGH (ref 0.61–1.24)
GFR calc Af Amer: 50 mL/min — ABNORMAL LOW (ref >60)
GFR calc non Af Amer: 43 mL/min — ABNORMAL LOW (ref >60)
Glucose, Bld: 110 mg/dL — ABNORMAL HIGH (ref 70–99)
POTASSIUM: 4.3 mmol/L (ref 3.5–5.1)
Sodium: 140 mmol/L (ref 135–145)

## 2018-02-09 LAB — CBC
HCT: 38.7 % — ABNORMAL LOW (ref 39.0–52.0)
Hemoglobin: 12.6 g/dL — ABNORMAL LOW (ref 13.0–17.0)
MCH: 30.6 pg (ref 26.0–34.0)
MCHC: 32.6 g/dL (ref 30.0–36.0)
MCV: 93.9 fL (ref 80.0–100.0)
Platelets: 192 10*3/uL (ref 150–400)
RBC: 4.12 MIL/uL — ABNORMAL LOW (ref 4.22–5.81)
RDW: 14.1 % (ref 11.5–15.5)
WBC: 5.4 10*3/uL (ref 4.0–10.5)
nRBC: 0 % (ref 0.0–0.2)

## 2018-02-09 MED ORDER — FUROSEMIDE 20 MG PO TABS: 20.0000 mg | ORAL_TABLET | Freq: Every day | ORAL | Status: DC

## 2018-02-09 MED ORDER — INFLUENZA VAC SPLIT HIGH-DOSE 0.5 ML IM SUSY
0.5000 mL | PREFILLED_SYRINGE | INTRAMUSCULAR | Status: AC
  Administered 2018-02-10: 0.5 mL via INTRAMUSCULAR
  Filled 2018-02-09: qty 0.5

## 2018-02-09 NOTE — Consult Note (Signed)
[address]       [place] [address]             [phone]      Cardiothoracic Surgery Consultation  Reason for Consult: Severe three-vessel coronary disease with severe left ventricular systolic dysfunction Referring Physician: Dr. Loralie Champagne  Luis Chen is an 77 y.o. male.  HPI:   The patient is a 77 year old gentleman with a history of hypertension, hyperlipidemia, chronic kidney disease, cardiomyopathy and congestive heart failure, and newly diagnosed coronary artery disease who was referred for evaluation for coronary artery bypass graft surgery.  The patient has a history of nonischemic cardiomyopathy dating back to 2010 when his ejection fraction was noted to be 25%.  He said that at that time he presented with exertional shortness of breath.  Cardiac catheterization showed nonobstructive coronary disease.  He was treated with medical therapy and his ejection fraction increased to 50% in 2011.  He has done well over the years and said that in August 2019 his PCP asked him to see Dr. Percival Spanish for follow-up since he has not seen him in many years.  A 2D echocardiogram was done on 10/28/2017 which was read as an ejection fraction of 55 to 60% although apparently Dr. Percival Spanish thought it was more like 45%.  There was hypokinesis of the basal-mid inferior lateral myocardium.  There is moderate aortic insufficiency with a pressure half-time of 401 ms.  There is trivial mitral regurgitation.  Patient said that at that time he was asymptomatic.  Then about 1 month ago he developed epigastric and lower chest discomfort that he described as an aching pressure sensation.  He had no shortness of breath or orthopnea.  His stamina has been good and he has been bowling couple days per week without any difficulty.  He underwent a nuclear stress test on 01/19/2018 which showed an ejection fraction of 16% by nuclear stress.  There were no ST segment changes during stress.  There  were multiple defects involving the anterior inferior and septal walls.  These findings were consistent with prior myocardial infarction with mild peri-infarct ischemia in the anteroseptal region.  He underwent right and left heart cath yesterday which showed severe three-vessel coronary disease.  The distal left main had 60% stenosis.  The ostial proximal LAD had 70% stenosis.  The mid LAD had 90% stenosis and was a large vessel that wraps around the apex.  The left circumflex had 50 to 60% stenosis involving the bifurcation into 2 marginal branches.  The right coronary artery was occluded proximally with filling of the distal vessel by collaterals which was relatively small.  The left ventricular ejection fraction was less than 25% visually.  Cardiac index was 2.6.  Pulmonary artery pressures were mildly elevated at 37/16 with a mean wedge pressure of 23.  LVEDP was 27.  Mean right atrial pressure was 4.  The patient was admitted for cardiac surgery evaluation and consultation with advanced heart failure given his poor ejection fraction.  His daughter is with him today.  The patient said that he has felt well except for the epigastric and lower chest discomfort.  He continues to deny any shortness of breath or orthopnea.  He has had no peripheral edema.  His stamina has been good.  He bowls several days per week and also works for [employer] part time.  He lives with his grandson in [place].  He is a [place] veteran with agent orange exposure  and has 70% disability.    Past Medical History:  Diagnosis Date  . CAD (coronary artery disease)    non obstructive. Left main normal. LAd proximal long 25% stenosis, termingating as focal 50% prox to mid lesion. First & second diag were small, normal. Circumflex in proximal av groove had luminal irregularities.. Was large mid obtuse marg which was branching, scattered luminal irregul. Infer branch did supply some septal perforators. Was PDA,small & normal.  <  1.59mm. There was prox 95% stenosis  . Cardiomyopathy    EF was 25% improved to 50%.   . CHF (congestive heart failure) ([place])   . CKD (chronic kidney disease)   . Hyperlipidemia    transient history, controlled on diet  . Hypertension     Past Surgical History:  Procedure Laterality Date  . KNEE ARTHROSCOPY Left 07/01/2015   Procedure: Irrigation and debridement left knee arthroscopy;  Surgeon: Renette Butters, MD;  Location: [facility];  Service: Orthopedics;  Laterality: Left;  . RIGHT/LEFT HEART CATH AND CORONARY ANGIOGRAPHY N/A 02/08/2018   Procedure: RIGHT/LEFT HEART CATH AND CORONARY ANGIOGRAPHY;  Surgeon: Jettie Booze, MD;  Location: [facility];  Service: Cardiovascular;  Laterality: N/A;    Family History  Problem Relation Age of Onset  . Diabetes Mother   . Arthritis Mother   . Heart disease Mother   . Hyperlipidemia Mother   . Hypertension Mother   . Heart attack Mother 45  . Heart disease Brother        had in his 88s. Had CABG x2  . Hypothyroidism Daughter   . Stroke Daughter     Social History:  reports that he has never smoked. He has never used smokeless tobacco. He reports that he drinks alcohol. He reports that he does not use drugs.  Allergies:  Allergies  Allergen Reactions  . Ace Inhibitors Swelling    Facial swelling     Medications:  I have reviewed the patient's current medications. Prior to Admission:  Medications Prior to Admission  Medication Sig Dispense Refill Last Dose  . aspirin 81 MG tablet Take 81 mg by mouth 2 (two) times daily.    02/08/2018 at 0630  . carvedilol (COREG) 3.125 MG tablet Take 1 tablet (3.125 mg total) by mouth 2 (two) times daily with a meal. 180 tablet 3 02/07/2018 at Unknown time  . furosemide (LASIX) 40 MG tablet Take 0.5 tablets (20 mg total) by mouth daily as needed for fluid or edema (for weight gain of more than 3 Lbs in 1 day). 30 tablet 0 02/07/2018 at Unknown time  . latanoprost (XALATAN) 0.005 %  ophthalmic solution Place 1 drop into both eyes at bedtime.    02/07/2018 at Unknown time  . losartan (COZAAR) 25 MG tablet Take 25 mg by mouth daily.    02/07/2018 at Unknown time  . pravastatin (PRAVACHOL) 80 MG tablet Take 80 mg by mouth daily.   02/07/2018 at Unknown time  . traZODone (DESYREL) 50 MG tablet Take 75 mg by mouth at bedtime as needed for sleep.   02/07/2018 at Unknown time  . trolamine salicylate (ASPERCREME) 10 % cream Apply 1 application topically 3 (three) times daily as needed (for arthritis pain.).   Past Month at Unknown time  . feeding supplement, ENSURE ENLIVE, (ENSURE ENLIVE) LIQD Take 237 mLs by mouth 3 (three) times daily between meals. (Patient taking differently: Take 237 mLs by mouth daily as needed (for nutritional support). ) 237 mL 12 More  than a month at Unknown time   Scheduled: . aspirin EC  81 mg Oral Daily  . carvedilol  3.125 mg Oral BID WC  . feeding supplement (ENSURE ENLIVE)  237 mL Oral TID BM  . ferrous gluconate  324 mg Oral BID WC  . [START ON 02/10/2018] furosemide  20 mg Oral Daily  . heparin  5,000 Units Subcutaneous Q8H  . [START ON 02/10/2018] Influenza vac split quadrivalent PF  0.5 mL Intramuscular Tomorrow-1000  . latanoprost  1 drop Both Eyes QHS  . losartan  25 mg Oral Daily  . rosuvastatin  40 mg Oral q1800  . sodium chloride flush  3 mL Intravenous Q12H  . spironolactone  12.5 mg Oral QHS   Continuous: . sodium chloride     EML:JQGBEE chloride, acetaminophen, ondansetron (ZOFRAN) IV, sodium chloride flush, traZODone Anti-infectives (From admission, onward)   None      Results for orders placed or performed during the hospital encounter of 02/08/18 (from the past 48 hour(s))  I-STAT 3, venous blood gas (G3P V)     Status: Abnormal   Collection Time: 02/08/18  1:35 PM  Result Value Ref Range   pH, Ven 7.315 7.250 - 7.430   pCO2, Ven 45.2 44.0 - 60.0 mmHg   pO2, Ven 36.0 32.0 - 45.0 mmHg   Bicarbonate 23.0 20.0 - 28.0 mmol/L    TCO2 24 22 - 32 mmol/L   O2 Saturation 63.0 %   Acid-base deficit 3.0 (H) 0.0 - 2.0 mmol/L   Patient temperature HIDE    Sample type VENOUS    Comment NOTIFIED PHYSICIAN   I-STAT 3, venous blood gas (G3P V)     Status: Abnormal   Collection Time: 02/08/18  1:35 PM  Result Value Ref Range   pH, Ven 7.320 7.250 - 7.430   pCO2, Ven 45.3 44.0 - 60.0 mmHg   pO2, Ven 36.0 32.0 - 45.0 mmHg   Bicarbonate 23.3 20.0 - 28.0 mmol/L   TCO2 25 22 - 32 mmol/L   O2 Saturation 64.0 %   Acid-base deficit 3.0 (H) 0.0 - 2.0 mmol/L   Patient temperature HIDE    Sample type VENOUS    Comment NOTIFIED PHYSICIAN   I-STAT 3, arterial blood gas (G3+)     Status: Abnormal   Collection Time: 02/08/18  1:39 PM  Result Value Ref Range   pH, Arterial 7.294 (L) 7.350 - 7.450   pCO2 arterial 48.0 32.0 - 48.0 mmHg   pO2, Arterial 94.0 83.0 - 108.0 mmHg   Bicarbonate 23.3 20.0 - 28.0 mmol/L   TCO2 25 22 - 32 mmol/L   O2 Saturation 96.0 %   Acid-base deficit 3.0 (H) 0.0 - 2.0 mmol/L   Patient temperature HIDE    Sample type ARTERIAL   Basic metabolic panel     Status: Abnormal   Collection Time: 02/08/18  6:25 PM  Result Value Ref Range   Sodium 142 135 - 145 mmol/L   Potassium 4.4 3.5 - 5.1 mmol/L   Chloride 112 (H) 98 - 111 mmol/L   CO2 24 22 - 32 mmol/L   Glucose, Bld 131 (H) 70 - 99 mg/dL   BUN 18 8 - 23 mg/dL   Creatinine, Ser 1.47 (H) 0.61 - 1.24 mg/dL   Calcium 8.9 8.9 - 10.3 mg/dL   GFR calc non Af Amer 45 (L) >60 mL/min   GFR calc Af Amer 52 (L) >60 mL/min   Anion gap 6 5 - 15  Comment: Performed at [facility], [place] [address], [place], [place] [address]  Basic metabolic panel     Status: Abnormal   Collection Time: 02/09/18  2:53 AM  Result Value Ref Range   Sodium 140 135 - 145 mmol/L   Potassium 4.3 3.5 - 5.1 mmol/L   Chloride 103 98 - 111 mmol/L   CO2 24 22 - 32 mmol/L   Glucose, Bld 110 (H) 70 - 99 mg/dL   BUN 28 (H) 8 - 23 mg/dL   Creatinine, Ser 1.51 (H) 0.61 - 1.24 mg/dL     Calcium 9.7 8.9 - 10.3 mg/dL   GFR calc non Af Amer 43 (L) >60 mL/min   GFR calc Af Amer 50 (L) >60 mL/min   Anion gap 13 5 - 15    Comment: Performed at [facility] [address], [place], [place] [address]  Lipid panel     Status: Abnormal   Collection Time: 02/09/18  2:53 AM  Result Value Ref Range   Cholesterol 171 0 - 200 mg/dL   Triglycerides 64 <150 mg/dL   HDL 49 >40 mg/dL   Total CHOL/HDL Ratio 3.5 RATIO   VLDL 13 0 - 40 mg/dL   LDL Cholesterol 109 (H) 0 - 99 mg/dL    Comment:        Total Cholesterol/HDL:CHD Risk Coronary Heart Disease Risk Table                     Men   Women  1/2 Average Risk   3.4   3.3  Average Risk       5.0   4.4  2 X Average Risk   9.6   7.1  3 X Average Risk  23.4   11.0        Use the calculated Patient Ratio above and the CHD Risk Table to determine the patient's CHD Risk.        ATP III CLASSIFICATION (LDL):  <100     mg/dL   Optimal  100-129  mg/dL   Near or Above                    Optimal  130-159  mg/dL   Borderline  160-189  mg/dL   High  >190     mg/dL   Very High Performed at [facility] [address] [place], [place] [address]   CBC     Status: Abnormal   Collection Time: 02/09/18  2:53 AM  Result Value Ref Range   WBC 5.4 4.0 - 10.5 K/uL   RBC 4.12 (L) 4.22 - 5.81 MIL/uL   Hemoglobin 12.6 (L) 13.0 - 17.0 g/dL   HCT 38.7 (L) 39.0 - 52.0 %   MCV 93.9 80.0 - 100.0 fL   MCH 30.6 26.0 - 34.0 pg   MCHC 32.6 30.0 - 36.0 g/dL   RDW 14.1 11.5 - 15.5 %   Platelets 192 150 - 400 K/uL   nRBC 0.0 0.0 - 0.2 %    Comment: Performed at [facility], [place] [address] [place],  [address]    No results found.  Review of Systems  Constitutional: Negative for chills, diaphoresis, fever, malaise/fatigue and weight loss.  HENT: Negative.   Eyes: Negative.   Respiratory: Negative for shortness of breath.   Cardiovascular: Positive for chest pain. Negative for palpitations, orthopnea, leg swelling and  PND.  Gastrointestinal: Negative.  Negative for  nausea and vomiting.       Epigastric pain  Genitourinary: Negative.   Musculoskeletal: Positive for joint pain.       And swelling  Skin: Negative.   Neurological: Negative.   Endo/Heme/Allergies: Negative.   Psychiatric/Behavioral: Negative.    Blood pressure 98/64, pulse 74, temperature (!) 97.5 F (36.4 C), resp. rate 15, height 5\' 10"  (1.778 m), weight 64.9 kg, SpO2 98 %. Physical Exam  Constitutional: He is oriented to person, place, and time. He appears well-developed and well-nourished. No distress.  HENT:  Head: Normocephalic and atraumatic.  Mouth/Throat: Oropharynx is clear and moist.  Eyes: Pupils are equal, round, and reactive to light. Conjunctivae and EOM are normal.  Neck: Normal range of motion. Neck supple. No JVD present. No thyromegaly present.  Cardiovascular: Normal rate, regular rhythm, normal heart sounds and intact distal pulses.  No murmur heard. Respiratory: Effort normal and breath sounds normal. No respiratory distress. He has no wheezes. He has no rales.  GI: Soft. Bowel sounds are normal. He exhibits no distension and no mass. There is no tenderness.  Musculoskeletal: Normal range of motion. He exhibits no edema.  Lymphadenopathy:    He has no cervical adenopathy.  Neurological: He is alert and oriented to person, place, and time. He has normal strength. No cranial nerve deficit or sensory deficit.  Skin: Skin is warm and dry.  Psychiatric: He has a normal mood and affect.    MYOCARDIAL PERFUSION IMAGING  Study Highlights    The left ventricular ejection fraction is severely decreased (<30%).  Nuclear stress EF: 16%.  There was no ST segment deviation noted during stress.  Defect 1: There is a medium defect of severe severity present in the mid inferoseptal, mid inferior, apical septal and apical inferior location.  Defect 2: There is a medium defect of severe severity present in the mid  anterior, mid anteroseptal, apical anterior and apex location.  Defect 3: There is a small defect of moderate severity present in the mid anterolateral and apical lateral location.  Findings consistent with prior myocardial infarction with mild peri-infarct ischemia in the anteroseptal region.  This is a high risk study.    Physicians   Panel Physicians Referring Physician Case Authorizing Physician  Jettie Booze, MD (Primary)    Procedures   RIGHT/LEFT HEART CATH AND CORONARY ANGIOGRAPHY  Conclusion     Mid LAD lesion is 90% stenosed.  Mid LM to Dist LM lesion is 60% stenosed.  Ost LAD lesion is 70% stenosed.  Ost Cx to Prox Cx lesion is 50% stenosed.  Mid Cx lesion is 25% stenosed.  Mid RCA lesion is 100% stenosed. Distal vessel fills by brisk left to right and right toright collaterals.  There is severe left ventricular systolic dysfunction.  The left ventricular ejection fraction is less than 25% by visual estimate.  LV end diastolic pressure is moderately elevated.  Ao sat 96%, PA sat 63%, PA pressure mean 25 mm Hg; PCWP mean 23 mm Hg, CO 4.8 L/min; CI 2.6   Severe multivessel disease.  Plan cardiac surgery consult.    Given low EF, will also obtain CHF consult.  Patient will need diuresis.  D/w Dr. Aundra Dubin.  Normal mean PA pressure.    Indications   Coronary artery disease involving native coronary artery of native heart with other form of angina pectoris ([place]) [I25.118 (LXB-26-OM)]  Acute systolic heart failure ([place]) [I50.21 (ICD-10-CM)]  Procedural Details   Technical Details The risks, benefits, and details  of the procedure were explained to the patient. The patient verbalized understanding and wanted to proceed. Informed written consent was obtained.  PROCEDURE TECHNIQUE: After Xylocaine anesthesia a 62F slender sheath was placed in the right radial artery with a single anterior needle wall stick. IV Heparin was given. Right coronary  angiography was done using a Judkins R4 guide catheter. Left ventriculography was done using a JR4 catheter. Left coronary angiography was done using a Judkins L3.5 guide catheter. A TR band was used for hemostasis.  Based on low EF and severe CAD, right heart cath was added on. U/s guidance for access in the right antecubital area. An image was captured and stored. 5 Fr Swan catheter advance. Glide wire required. Pressure measurement and O2 sats checked.  Contrast: 50 cc  Estimated blood loss <50 mL.   During this procedure medications were administered to achieve and maintain moderate conscious sedation while the patient's heart rate, blood pressure, and oxygen saturation were continuously monitored and I was present face-to-face 100% of this time.  Medications  (Filter: Administrations occurring from 02/08/18 1243 to 02/08/18 1407)  Medication Rate/Dose/Volume Action  Date Time   Heparin (Porcine) in NaCl 1000-0.9 UT/500ML-% SOLN (mL) 500 mL Given 02/08/18 1247   Total dose as of 02/09/18 2025        500 mL        Heparin (Porcine) in NaCl 1000-0.9 UT/500ML-% SOLN (mL) 500 mL Given 02/08/18 1247   Total dose as of 02/09/18 2025        500 mL        fentaNYL (SUBLIMAZE) injection (mcg) 25 mcg Given 02/08/18 1300   Total dose as of 02/09/18 2025        25 mcg        midazolam (VERSED) injection (mg) 1 mg Given 02/08/18 1300   Total dose as of 02/09/18 2025        1 mg        lidocaine (PF) (XYLOCAINE) 1 % injection (mL) 2 mL Given 02/08/18 1305   Total dose as of 02/09/18 2025 3 mL Given 1323   5 mL        Radial Cocktail/Verapamil only (mL) 10 mL Given 02/08/18 1305   Total dose as of 02/09/18 2025        10 mL        heparin injection (Units) 3,500 Units Given 02/08/18 1309   Total dose as of 02/09/18 2025        3,500 Units        iohexol (OMNIPAQUE) 350 MG/ML injection (mL) 50 mL Given 02/08/18 1348   Total dose as of 02/09/18 2025        50 mL        Sedation Time    Sedation Time Physician-1: 41 minutes 26 seconds  Coronary Findings   Diagnostic  Dominance: Right  Left Main  Mid LM to Dist LM lesion 60% stenosed  Mid LM to Dist LM lesion is 60% stenosed. The lesion is eccentric.  Left Anterior Descending  Ost LAD lesion 70% stenosed  Ost LAD lesion is 70% stenosed.  Mid LAD lesion 90% stenosed  Mid LAD lesion is 90% stenosed. The lesion is ulcerative.  Left Circumflex  Ost Cx to Prox Cx lesion 50% stenosed  Ost Cx to Prox Cx lesion is 50% stenosed.  Mid Cx lesion 25% stenosed  Mid Cx lesion is 25% stenosed.  Right Coronary Artery  Collaterals  Dist RCA filled by  collaterals from Prox RCA.    Mid RCA lesion 100% stenosed  Mid RCA lesion is 100% stenosed.  Right Posterior Descending Artery  Collaterals  RPDA filled by collaterals from 1st Sept.    Intervention   No interventions have been documented.  Right Heart   Right Heart Pressures Ao sat 96%, PA sat 63%, PA pressure mean 25 mm Hg; PCWP mean 23 mm Hg, CO 4.8 L/min; CI 2.6  Wall Motion   Resting       All segments of the heart are hypokinetic.          Left Heart   Left Ventricle The left ventricle is moderately dilated. There is severe left ventricular systolic dysfunction. LV end diastolic pressure is moderately elevated. The left ventricular ejection fraction is less than 25% by visual estimate. There are LV function abnormalities due to global hypokinesis.  Coronary Diagrams   Diagnostic  Dominance: Right    Intervention   Implants    No implant documentation for this case.  MERGE Images   Show images for CARDIAC CATHETERIZATION   Link to Procedure Log   Procedure Log    Hemo Data    Most Recent Value  Fick Cardiac Output 4.83 L/min  Fick Cardiac Output Index 2.6 (L/min)/BSA  RA A Wave 7 mmHg  RA V Wave 4 mmHg  RA Mean 4 mmHg  RV Systolic Pressure 41 mmHg  RV Diastolic Pressure 6 mmHg  RV EDP 7 mmHg  PA Systolic Pressure 37 mmHg  PA Diastolic  Pressure 16 mmHg  PA Mean 24 mmHg  PW A Wave 28 mmHg  PW V Wave 29 mmHg  PW Mean 23 mmHg  AO Systolic Pressure 626 mmHg  AO Diastolic Pressure 60 mmHg  AO Mean 80 mmHg  LV Systolic Pressure 948 mmHg  LV Diastolic Pressure 10 mmHg  LV EDP 27 mmHg  AOp Systolic Pressure 546 mmHg  AOp Diastolic Pressure 55 mmHg  AOp Mean Pressure 75 mmHg  LVp Systolic Pressure 270 mmHg  LVp Diastolic Pressure 11 mmHg  LVp EDP Pressure 20 mmHg  QP/QS 1  TPVR Index 9.61 HRUI  TSVR Index 30.77 HRUI  PVR SVR Ratio 0.03  TPVR/TSVR Ratio 0.31                 *[facility]*                   *[facility]*                         [address], [place] [address]                            [phone]  ------------------------------------------------------------------- Transthoracic Echocardiography  Patient:    Frantz, Quattrone MR #:       [id number] Study Date: 02/09/2018 Gender:     M Age:        28 Height:     177.8 cm Weight:     64.9 kg BSA:        1.78 m^2 Pt. Status: Room:       2W30C   ADMITTING    Loralie Champagne, M.D.  ATTENDING    Varanasi, [place], Inpatient  SONOGRAPHER  Dance, [place], Scurry, Ashley M  cc:  ------------------------------------------------------------------- LV EF: 20% -   25%  ------------------------------------------------------------------- Indications:      CHF - 428.0.  ------------------------------------------------------------------- History:   PMH:   Coronary artery disease.  Risk factors: Hypertension. Dyslipidemia.  ------------------------------------------------------------------- Study Conclusions  - Left ventricle: The cavity size was mildly dilated. Systolic   function was severely reduced. The estimated ejection fraction   was in the range of 20% to 25%. Severe diffuse hypokinesis. - Aortic valve: There was mild  regurgitation. - Mitral valve: There was no significant regurgitation. - Left atrium: The atrium was mildly to moderately dilated. - Right ventricle: Systolic function was normal. - Right atrium: The atrium was mildly to moderately dilated. - Tricuspid valve: There was trivial regurgitation. - Pulmonic valve: There was mild regurgitation.  Impressions:  - Severe diffuse hypokinesis with reduced EF, changed from prior.   Consistent with severe multivessel CAD.  ------------------------------------------------------------------- Study data:   Study status:  Routine.  Procedure:  Transthoracic echocardiography. Image quality was adequate.  Study completion: There were no complications.          Transthoracic echocardiography.  M-mode, complete 2D, spectral Doppler, and color Doppler.  Birthdate:  Patient birthdate: 1940/03/22.  Age:  Patient is 77 yr old.  Sex:  Gender: male.    BMI: 20.5 kg/m^2.  Blood pressure:     96/75  Patient status:  Inpatient.  Study date: Study date: 02/09/2018. Study time: 03:06 PM.  Location:  Bedside.   -------------------------------------------------------------------  ------------------------------------------------------------------- Left ventricle:  The cavity size was mildly dilated. Systolic function was severely reduced. The estimated ejection fraction was in the range of 20% to 25%.  Regional wall motion abnormalities: Severe diffuse hypokinesis.  ------------------------------------------------------------------- Aortic valve:   Trileaflet; mildly thickened, mildly calcified leaflets. Mobility was not restricted.  Doppler:  Transvalvular velocity was within the normal range. There was no stenosis. There was mild regurgitation.  ------------------------------------------------------------------- Aorta:  Aortic root: The aortic root was normal in size.  ------------------------------------------------------------------- Mitral valve:    Structurally normal valve.   Mobility was not restricted.  Doppler:  Transvalvular velocity was within the normal range. There was no evidence for stenosis. There was no significant regurgitation.  ------------------------------------------------------------------- Left atrium:  The atrium was mildly to moderately dilated.   ------------------------------------------------------------------- Right ventricle:  The cavity size was normal. Wall thickness was normal. Systolic function was normal.  ------------------------------------------------------------------- Pulmonic valve:   Poorly visualized.  The valve appears to be grossly normal.    Doppler:  Transvalvular velocity was within the normal range. There was no evidence for stenosis. There was mild regurgitation.  ------------------------------------------------------------------- Tricuspid valve:   Structurally normal valve.    Doppler: Transvalvular velocity was within the normal range. There was trivial regurgitation.  ------------------------------------------------------------------- Pulmonary artery:   The main pulmonary artery was normal-sized. Systolic pressure was within the normal range.  ------------------------------------------------------------------- Right atrium:  The atrium was mildly to moderately dilated.  ------------------------------------------------------------------- Pericardium:  There was no pericardial effusion.  ------------------------------------------------------------------- Systemic veins: Inferior vena cava: The vessel was normal in size.  ------------------------------------------------------------------- Measurements   Left ventricle                           Value        Reference  LV ID, ED, PLAX chordal  50.5  mm     43 - 52  LV ID, ES, PLAX chordal          (H)     46.4  mm     23 - 38  LV fx shortening, PLAX chordal   (L)     8     %      >=29  LV PW  thickness, ED                      10.2  mm     ----------  IVS/LV PW ratio, ED                      0.97         <=1.3  Stroke volume, 2D                        33    ml     ----------  Stroke volume/bsa, 2D                    18    ml/m^2 ----------  LV e&', lateral                           11.5  cm/s   ----------  LV E/e&', lateral                         4.22         ----------  LV e&', medial                            7.94  cm/s   ----------  LV E/e&', medial                          6.11         ----------  LV e&', average                           9.72  cm/s   ----------  LV E/e&', average                         4.99         ----------    Ventricular septum                       Value        Reference  IVS thickness, ED                        9.92  mm     ----------    LVOT                                     Value        Reference  LVOT ID, S                               23    mm     ----------  LVOT area  4.15  cm^2   ----------  LVOT peak velocity, S                    46.6  cm/s   ----------  LVOT mean velocity, S                    30.6  cm/s   ----------  LVOT VTI, S                              7.96  cm     ----------    Aortic valve                             Value        Reference  Aortic regurg pressure half-time         620   ms     ----------    Aorta                                    Value        Reference  Aortic root ID, ED                       40    mm     ----------  Ascending aorta ID, A-P, S               35    mm     ----------    Left atrium                              Value        Reference  LA ID, A-P, ES                           33    mm     ----------  LA ID/bsa, A-P                           1.85  cm/m^2 <=2.2  LA volume, S                             53    ml     ----------  LA volume/bsa, S                         29.7  ml/m^2 ----------  LA volume, ES, 1-p A4C                   60.7  ml     ----------  LA  volume/bsa, ES, 1-p A4C               34    ml/m^2 ----------  LA volume, ES, 1-p A2C                   45.1  ml     ----------  LA volume/bsa, ES, 1-p A2C               25.3  ml/m^2 ----------    Mitral valve  Value        Reference  Mitral E-wave peak velocity              48.5  cm/s   ----------  Mitral A-wave peak velocity              90.7  cm/s   ----------  Mitral deceleration time                 204   ms     150 - 230  Mitral E/A ratio, peak                   0.5          ----------    Right atrium                             Value        Reference  RA ID, S-I, ES, A4C              (H)     53.5  mm     34 - 49  RA area, ES, A4C                         18.6  cm^2   8.3 - 19.5  RA volume, ES, A/L                       53.4  ml     ----------  RA volume/bsa, ES, A/L                   29.9  ml/m^2 ----------    Right ventricle                          Value        Reference  RV ID, minor axis, ED, A4C base          28.2  mm     ----------  TAPSE                                    18.4  mm     ----------  RV s&', lateral, S                        7.4   cm/s   ----------    Pulmonic valve                           Value        Reference  Pulmonic regurg velocity, ED             88.2  cm/s   ----------  Legend: (L)  and  (H)  mark values outside specified reference range.  ------------------------------------------------------------------- Prepared and Electronically Authenticated by  Buford Dresser 2019-12-04T18:11:17   Assessment/Plan:  This 77 year old gentleman presents with a one-month history of epigastric and lower chest discomfort that may be an anginal equivalent with no symptoms of congestive heart failure despite having severe left ventricular systolic dysfunction with ejection fraction of 16% by nuclear stress and 20 to 25% by echocardiogram today.  Cardiac catheterization shows severe three-vessel coronary disease with a 90% mid  LAD stenosis and occluded right  coronary artery with left-to-right collaterals filling a small distal vessel.  Despite his low ejection fraction his cardiac index is good and pulmonary artery pressures are only mildly elevated.  Left ventricular end-diastolic pressure was also mildly elevated.  His symptoms do not necessarily match his degree of left ventricular systolic dysfunction.  His ejection fraction in August 2019 was 50 to 55% by echocardiogram.  He does have a history of prior nonischemic cardiomyopathy in 2010 when his ejection fraction was noted to be 25% which subsequently improved to 50% a year later.  He had no significant coronary disease at that time.  It is certainly possible that is present for a left ventricular systolic function is due to a combination of nonischemic and ischemic cardiomyopathy.  His degree of global left ventricular systolic dysfunction appears out of proportion to his degree of coronary disease and even his lateral wall appears severely hypokinetic despite moderate disease in the left circumflex territory.  His left ventricular myocardium appears diffusely thinned to me and I think it is unclear if his left ventricular function will improve after coronary bypass graft surgery.  His LAD looks like a good graftable vessel as does his left circumflex branches.  His right coronary is probably graftable although this is a relatively small distal vessel.  I think it may be worthwhile doing a cardiac MRI to assess viability before putting him through coronary artery bypass surgery.  I will discuss this further with Dr. Aundra Dubin before making further decisions about additional testing and operability.  I spent 60 minutes performing this consultation and > 50% of this time was spent face to face counseling and coordinating the care of this patient's severe multivessel coronary disease with severe left ventricular systolic dysfunction.  [name] 02/09/2018, 8:07 PM

## 2018-02-09 NOTE — Progress Notes (Signed)
Advanced Heart Failure Rounding Note  PCP-Cardiologist: No primary care provider on file.   Subjective:    No UOP charted. No weight this am.  Given 40 mg iv lasix last night and again this am. Creatinine stable 1.47 > 1.51.   Objective:   Weight Range: 64.9 kg Body mass index is 20.53 kg/m.   Vital Signs:   Temp:  [97.7 F (36.5 C)-98.3 F (36.8 C)] 98.3 F (36.8 C) (12/04 0837) Pulse Rate:  [65-100] 80 (12/04 0837) Resp:  [0-36] 14 (12/04 0837) BP: (96-139)/(63-92) 96/75 (12/04 0837) SpO2:  [95 %-100 %] 98 % (12/04 0837) Weight:  [64.9 kg] 64.9 kg (12/03 1813) Last BM Date: 02/08/18  Weight change: Filed Weights   02/08/18 0706 02/08/18 1813  Weight: 68.9 kg 64.9 kg    Intake/Output:   Intake/Output Summary (Last 24 hours) at 02/09/2018 0913 Last data filed at 02/09/2018 0000 Gross per 24 hour  Intake 477 ml  Output 0 ml  Net 477 ml      Physical Exam    General:  Well appearing. No resp difficulty HEENT: Normal Neck: Supple. JVP not elevated. Carotids 2+ bilat; no bruits. No lymphadenopathy or thyromegaly appreciated. Cor: PMI nondisplaced. Regular rate & rhythm. No rubs, gallops or murmurs. Lungs: Clear Abdomen: Soft, nontender, nondistended. No hepatosplenomegaly. No bruits or masses. Good bowel sounds. Extremities: No cyanosis, clubbing, rash, edema Neuro: Alert & orientedx3, cranial nerves grossly intact. moves all 4 extremities w/o difficulty. Affect pleasant   Telemetry   NSR in 80s, personally reviewed.   EKG    No new tracings.   Labs    CBC Recent Labs    02/09/18 0253  WBC 5.4  HGB 12.6*  HCT 38.7*  MCV 93.9  PLT 275   Basic Metabolic Panel Recent Labs    02/08/18 1825 02/09/18 0253  NA 142 140  K 4.4 4.3  CL 112* 103  CO2 24 24  GLUCOSE 131* 110*  BUN 18 28*  CREATININE 1.47* 1.51*  CALCIUM 8.9 9.7   Liver Function Tests No results for input(s): AST, ALT, ALKPHOS, BILITOT, PROT, ALBUMIN in the last 72  hours. No results for input(s): LIPASE, AMYLASE in the last 72 hours. Cardiac Enzymes No results for input(s): CKTOTAL, CKMB, CKMBINDEX, TROPONINI in the last 72 hours.  BNP: BNP (last 3 results) No results for input(s): BNP in the last 8760 hours.  ProBNP (last 3 results) No results for input(s): PROBNP in the last 8760 hours.   D-Dimer No results for input(s): DDIMER in the last 72 hours. Hemoglobin A1C No results for input(s): HGBA1C in the last 72 hours. Fasting Lipid Panel Recent Labs    02/09/18 0253  CHOL 171  HDL 49  LDLCALC 109*  TRIG 64  CHOLHDL 3.5   Thyroid Function Tests No results for input(s): TSH, T4TOTAL, T3FREE, THYROIDAB in the last 72 hours.  Invalid input(s): FREET3  Other results:   Imaging     No results found.   Medications:     Scheduled Medications: . aspirin EC  81 mg Oral Daily  . carvedilol  3.125 mg Oral BID WC  . feeding supplement (ENSURE ENLIVE)  237 mL Oral TID BM  . ferrous gluconate  324 mg Oral BID WC  . furosemide  40 mg Intravenous Daily  . heparin  5,000 Units Subcutaneous Q8H  . latanoprost  1 drop Both Eyes QHS  . losartan  25 mg Oral Daily  . rosuvastatin  40 mg  Oral q1800  . sodium chloride flush  3 mL Intravenous Q12H  . spironolactone  12.5 mg Oral QHS     Infusions: . sodium chloride       PRN Medications:  sodium chloride, acetaminophen, ondansetron (ZOFRAN) IV, sodium chloride flush, traZODone    Patient Profile   Luis Chen is a 77 y.o. male with a history of systolic HF with improved EF from 25% in 2010 -> 50% 2011 due to NICM, nonobstructive CAD, CKD, HL, and HTN.  Had [place] today, which showed severe multivessel disease with normal output and EF < 25%. HF team consulted to optimize prior to CABG.   Assessment/Plan   1. CAD: Severe 3 vessel disease with occluded RCA.  He has relatively mild anginal symptoms.  Coronary anatomy appears best-suited for CABG.  Dr. Cyndia Bent saw today,  discussed with him.   - [name] will need an echo today.  If anterior and septal walls are akinetic, will need MRI viability study.  If LAD territory appears viable by echo or MRI, would favor proceeding to CABG.  - Continue Crestor 40 mg daily and ASA 81 daily.  2. Acute on chronic systolic CHF: EF 44% on Cardiolite, <25% on LV-gram.  This is down from around 45% on echo in 8/19 (officially read as having normal EF but thought to be mildly abnormal at that time).  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  Patient had EF down to 25% in the past without significant CAD on prior cath, EF then improved over time.  Now EF is back down and there is significant coronary disease, but there may be a nonischemic component as well.  He is not a heavy drinker and there is a family history of CAD but not dilated cardiomyopathy.  PCWP was mildly elevated on initial RHC, cardiac output was preserved.  He says that he urinated a lot with IV Lasix but I/Os not recorded.  He does not look volume overloaded today.  No dyspnea.  - Continue current Coreg, spironolactone, and losartan.  Will not titrate today with relatively soft BP.  H/o angioedema with ACEI so not candidate for Entresto.  - Got IV Lasix today, can transition to Lasix 20 mg po daily tomorrow.  - Will get formal echo. If LAD territory is akinetic, will get cardiac MRI as above.  If LAD territory appears viable by MRI or echo, favor CABG.   - If he undergoes CABG, will reassess with echo in 3 months to determine ICD need (narrow QRS, not CRT candidate).   Loralie Champagne 02/09/2018 10:02 AM

## 2018-02-10 ENCOUNTER — Other Ambulatory Visit: Payer: Self-pay | Admitting: *Deleted

## 2018-02-10 ENCOUNTER — Inpatient Hospital Stay (HOSPITAL_COMMUNITY): Payer: Medicare Other

## 2018-02-10 DIAGNOSIS — I361 Nonrheumatic tricuspid (valve) insufficiency: Secondary | ICD-10-CM

## 2018-02-10 DIAGNOSIS — I517 Cardiomegaly: Secondary | ICD-10-CM

## 2018-02-10 DIAGNOSIS — I340 Nonrheumatic mitral (valve) insufficiency: Secondary | ICD-10-CM

## 2018-02-10 DIAGNOSIS — I2510 Atherosclerotic heart disease of native coronary artery without angina pectoris: Secondary | ICD-10-CM

## 2018-02-10 LAB — BASIC METABOLIC PANEL
Anion gap: 13 (ref 5–15)
Anion gap: 12 (ref 5–15)
BUN: 45 mg/dL — AB (ref 8–23)
BUN: 45 mg/dL — AB (ref 8–23)
CO2: 24 mmol/L (ref 22–32)
CO2: 25 mmol/L (ref 22–32)
Calcium: 9.4 mg/dL (ref 8.9–10.3)
Calcium: 9.6 mg/dL (ref 8.9–10.3)
Chloride: 101 mmol/L (ref 98–111)
Chloride: 101 mmol/L (ref 98–111)
Creatinine, Ser: 1.73 mg/dL — ABNORMAL HIGH (ref 0.61–1.24)
Creatinine, Ser: 1.79 mg/dL — ABNORMAL HIGH (ref 0.61–1.24)
GFR calc Af Amer: 43 mL/min — ABNORMAL LOW (ref >60)
GFR calc Af Amer: 41 mL/min — ABNORMAL LOW (ref >60)
GFR calc non Af Amer: 37 mL/min — ABNORMAL LOW (ref >60)
GFR calc non Af Amer: 35 mL/min — ABNORMAL LOW (ref >60)
Glucose, Bld: 118 mg/dL — ABNORMAL HIGH (ref 70–99)
Glucose, Bld: 112 mg/dL — ABNORMAL HIGH (ref 70–99)
Potassium: 3.9 mmol/L (ref 3.5–5.1)
Potassium: 4.5 mmol/L (ref 3.5–5.1)
Sodium: 138 mmol/L (ref 135–145)
Sodium: 138 mmol/L (ref 135–145)

## 2018-02-10 MED ORDER — LOSARTAN POTASSIUM 25 MG PO TABS: 25.0000 mg | ORAL_TABLET | Freq: Every day | ORAL | Status: DC

## 2018-02-10 MED ORDER — GADOBUTROL 1 MMOL/ML IV SOLN
7.0000 mL | Freq: Once | INTRAVENOUS | Status: AC | PRN
  Administered 2018-02-10: 7 mL via INTRAVENOUS

## 2018-02-10 MED ORDER — SODIUM CHLORIDE 0.9 % IV BOLUS
250.0000 mL | Freq: Once | INTRAVENOUS | Status: AC
  Administered 2018-02-10: 250 mL via INTRAVENOUS

## 2018-02-10 MED ORDER — FUROSEMIDE 20 MG PO TABS: 20.0000 mg | ORAL_TABLET | Freq: Every day | ORAL | Status: DC

## 2018-02-10 NOTE — Progress Notes (Addendum)
Advanced Heart Failure Rounding Note  PCP-Cardiologist: No primary care provider on file.   Subjective:    No UOP or weight again today. Requested from RN.   Creatinine up 1.51 > 1.73.   Denies CP, SOB, or dizziness. Walking around room. Says he urinated a lot yesterday.   Echo 02/2018: EF 20-25%, severe diffuse HK, mild AI, LA and RA mod dilated, RV normal, trivial TR, mild PI  Objective:   Weight Range: 64.9 kg Body mass index is 20.53 kg/m.   Vital Signs:   Temp:  [97.5 F (36.4 C)-98.3 F (36.8 C)] 97.7 F (36.5 C) (12/05 0001) Pulse Rate:  [71-80] 71 (12/05 0001) Resp:  [14-16] 16 (12/05 0001) BP: (96-98)/(52-75) 96/52 (12/05 0001) SpO2:  [94 %-98 %] 94 % (12/05 0001) Last BM Date: 02/08/18  Weight change: Filed Weights   02/08/18 0706 02/08/18 1813  Weight: 68.9 kg 64.9 kg    Intake/Output:   Intake/Output Summary (Last 24 hours) at 02/10/2018 0720 Last data filed at 02/09/2018 1031 Gross per 24 hour  Intake 830 ml  Output -  Net 830 ml      Physical Exam    General: Thin No resp difficulty. HEENT: Normal x poor dentation Neck: Supple. JVP 5-6. Carotids 2+ bilat; no bruits. No thyromegaly or nodule noted. Cor: PMI nondisplaced. RRR, No M/G/R noted Lungs: CTAB, normal effort. Abdomen: Soft, non-tender, non-distended, no HSM. No bruits or masses. +BS  Extremities: No cyanosis, clubbing, or rash. R and LLE no edema.  Neuro: Alert & orientedx3, cranial nerves grossly intact. moves all 4 extremities w/o difficulty. Affect pleasant   Telemetry   NSR in 80s, personally reviewed.   EKG    No new tracings.   Labs    CBC Recent Labs    02/09/18 0253  WBC 5.4  HGB 12.6*  HCT 38.7*  MCV 93.9  PLT 154   Basic Metabolic Panel Recent Labs    02/09/18 0253 02/10/18 0343  NA 140 138  K 4.3 3.9  CL 103 101  CO2 24 24  GLUCOSE 110* 118*  BUN 28* 45*  CREATININE 1.51* 1.73*  CALCIUM 9.7 9.4   Liver Function Tests No results for  input(s): AST, ALT, ALKPHOS, BILITOT, PROT, ALBUMIN in the last 72 hours. No results for input(s): LIPASE, AMYLASE in the last 72 hours. Cardiac Enzymes No results for input(s): CKTOTAL, CKMB, CKMBINDEX, TROPONINI in the last 72 hours.  BNP: BNP (last 3 results) No results for input(s): BNP in the last 8760 hours.  ProBNP (last 3 results) No results for input(s): PROBNP in the last 8760 hours.   D-Dimer No results for input(s): DDIMER in the last 72 hours. Hemoglobin A1C No results for input(s): HGBA1C in the last 72 hours. Fasting Lipid Panel Recent Labs    02/09/18 0253  CHOL 171  HDL 49  LDLCALC 109*  TRIG 64  CHOLHDL 3.5   Thyroid Function Tests No results for input(s): TSH, T4TOTAL, T3FREE, THYROIDAB in the last 72 hours.  Invalid input(s): FREET3  Other results:   Imaging    No results found.   Medications:     Scheduled Medications: . aspirin EC  81 mg Oral Daily  . carvedilol  3.125 mg Oral BID WC  . feeding supplement (ENSURE ENLIVE)  237 mL Oral TID BM  . ferrous gluconate  324 mg Oral BID WC  . furosemide  20 mg Oral Daily  . heparin  5,000 Units Subcutaneous Q8H  .  Influenza vac split quadrivalent PF  0.5 mL Intramuscular Tomorrow-1000  . latanoprost  1 drop Both Eyes QHS  . losartan  25 mg Oral Daily  . rosuvastatin  40 mg Oral q1800  . sodium chloride flush  3 mL Intravenous Q12H  . spironolactone  12.5 mg Oral QHS    Infusions: . sodium chloride      PRN Medications: sodium chloride, acetaminophen, ondansetron (ZOFRAN) IV, sodium chloride flush, traZODone    Patient Profile   Luis Chen is a 77 y.o. male with a history of systolic HF with improved EF from 25% in 2010 -> 50% 2011 due to NICM, nonobstructive CAD, CKD, HL, and HTN.  Had [place] today, which showed severe multivessel disease with normal output and EF < 25%. HF team consulted to optimize prior to CABG.   Assessment/Plan   1. CAD: Severe 3 vessel disease with  occluded RCA.  He has relatively mild anginal symptoms.  Coronary anatomy appears best-suited for CABG.  Dr. Cyndia Bent saw today, discussed with him. - Echo 02/2018: EF 20-25%, severe diffuse HK, mild AI, LA and RA mod dilated, RV normal, trivial TR, mild PI   - Will have Dr Aundra Dubin review echo. If anterior and septal walls are akinetic, will need MRI viability study.  If LAD territory appears viable by echo or MRI, would favor proceeding to CABG.  - Continue Crestor 40 mg daily and ASA 81 daily.  2. Acute on chronic systolic CHF: EF 29% on Cardiolite, <25% on LV-gram.  This is down from around 45% on echo in 8/19 (officially read as having normal EF but thought to be mildly abnormal at that time).  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  Patient had EF down to 25% in the past without significant CAD on prior cath, EF then improved over time.  Now EF is back down and there is significant coronary disease, but there may be a nonischemic component as well.  He is not a heavy drinker and there is a family history of CAD but not dilated cardiomyopathy.  PCWP was mildly elevated on initial RHC, cardiac output was preserved. - Echo 02/2018: EF 20-25%, severe diffuse HK, mild AI, LA and RA mod dilated, RV normal, trivial TR, mild PI    - Volume stable to dry. Creat up 1.5 > 1.7. Hold lasix today, reassess volume on exam tomorrow.  - Continue coreg 3.125 mg BID - Continue spiro 12.5 mg daily - Continue losartan 25 mg daily. H/o angioedema with ACEI so not candidate for Entresto.  - If he undergoes CABG, will reassess with echo in 3 months to determine ICD need (narrow QRS, not CRT candidate).   Georgiana Shore 02/10/2018 7:20 AM  Patient seen with NP, agree with the above note.   I reviewed his echo this morning.  EF 20-25% with diffuse hypokinesis, worse towards apex.  There are no areas of frank akinesis and thinning noted, so suspect significant viability.  I will get a cardiac MRI today to confirm, should be  able to give contrast with GFR 43.  As above, suspect mixed ischemic/nonischemic cardiomyopathy.  Would favor CABG with extensive CAD and lack of frank akinesis/scar noted on echo (to confirm by MRI).  Will discuss with Dr. Cyndia Bent, if not able to do CABG tomorrow would probably let him go home.   Creatinine higher today at 1.7, suspect overdiuresis.  Hold Lasix today, already got losartan.  Will hold losartan tomorrow.  Encourage po hydration, give 250  cc bolus.   Loralie Champagne 02/10/2018 9:30 AM

## 2018-02-10 NOTE — Research (Signed)
[facility] Research study protocol presented to patient and family. Questions encouraged and answered. Informed Consent Form and study pamphlet left for patient/family to review. Patient seems very interested however, its still questionable if he is operable and further testing has been ordered.Marland Kitchen Research will follow patient in epic and speak with patient again after CABG has been scheduled.

## 2018-02-10 NOTE — Progress Notes (Addendum)
Initial Nutrition Assessment  DOCUMENTATION CODES:   Non-severe (moderate) malnutrition in context of chronic illness  INTERVENTION:    Ensure Enlive po TID, each supplement provides 350 kcal and 20 grams of protein  NUTRITION DIAGNOSIS:   Moderate Malnutrition related to chronic illness(CAD, heart failure) as evidenced by moderate fat depletion, moderate muscle depletion and 12% weight loss x 5.5 months  GOAL:   Patient will meet greater than or equal to 90% of their needs  MONITOR:   PO intake, Supplement acceptance, Labs, Skin, Weight trends, I & O's  REASON FOR ASSESSMENT:   Malnutrition Screening Tool  ASSESSMENT:   77 yo Male with PMH of CAD, CKD, HL, and HTN; admitted for evaluation of acute systolic HF.  RD spoke with patient. Up walking around in room. Reports a good appetite. Enjoyed his breakfast this AM. PO intake 100% per flowsheet records.  Heart cath showed multivessel disease; recommendation for CABG. Has Ensure Enlive supplements ordered and is drinking. Labs & medications reviewed.  Pt endorses weight loss, however, unable to quantify amount. Per readings below, pt has had a 12% weight loss since June 2019. Weight loss is significant for time frame.  NUTRITION - FOCUSED PHYSICAL EXAM:    Most Recent Value  Orbital Region  Mild depletion  Upper Arm Region  Moderate depletion  Thoracic and Lumbar Region  Moderate depletion   Buccal Region  Mild depletion  Temple Region  Mild depletion  Clavicle Bone Region  Moderate depletion  Clavicle and Acromion Bone Region  Moderate depletion  Scapular Bone Region  Moderate repletion  Dorsal Hand  Mild depletion  Patellar Region  Moderate depletion  Anterior Thigh Region  Moderate depletion  Posterior Calf Region  Moderate depletion  Edema (RD Assessment)  None     Diet Order:   Diet Order            Diet Heart Room service appropriate? Yes; Fluid consistency: Thin  Diet effective now              EDUCATION NEEDS:   No education needs have been identified at this time  Skin:  Skin Assessment: Reviewed RN Assessment  Last BM:  12/4   Wt Readings from Last 10 Encounters:  02/10/18 63.6 kg  01/25/18 68.9 kg  01/19/18 70.3 kg  10/18/17 70.7 kg  08/25/17 72.8 kg  08/25/17 72.7 kg  07/02/17 74 kg  02/23/17 75.4 kg  06/12/16 71.6 kg  11/08/15 68.3 kg   Height:   Ht Readings from Last 1 Encounters:  02/08/18 5\' 10"  (1.778 m)   Weight:   Wt Readings from Last 1 Encounters:  02/10/18 63.6 kg   BMI:  Body mass index is 20.12 kg/m.  Estimated Nutritional Needs:   Kcal:  1900-2100  Protein:  95-110 gm  Fluid:  1.9-2.1 L  Luis Chen, RD, LDN Pager #: [phone] After-Hours Pager #: [phone]

## 2018-02-10 NOTE — Plan of Care (Signed)
  Problem: Nutrition: Goal: Adequate nutrition will be maintained Outcome: Progressing   Problem: Coping: Goal: Level of anxiety will decrease Outcome: Progressing   

## 2018-02-11 ENCOUNTER — Encounter (HOSPITAL_COMMUNITY): Payer: Medicare Other

## 2018-02-11 ENCOUNTER — Other Ambulatory Visit: Payer: Self-pay | Admitting: *Deleted

## 2018-02-11 ENCOUNTER — Encounter (HOSPITAL_COMMUNITY): Payer: Self-pay

## 2018-02-11 ENCOUNTER — Inpatient Hospital Stay (HOSPITAL_COMMUNITY): Payer: Medicare Other

## 2018-02-11 DIAGNOSIS — I2510 Atherosclerotic heart disease of native coronary artery without angina pectoris: Secondary | ICD-10-CM

## 2018-02-11 DIAGNOSIS — Z01810 Encounter for preprocedural cardiovascular examination: Secondary | ICD-10-CM

## 2018-02-11 LAB — BASIC METABOLIC PANEL
Anion gap: 10 (ref 5–15)
BUN: 47 mg/dL — ABNORMAL HIGH (ref 8–23)
CALCIUM: 9.3 mg/dL (ref 8.9–10.3)
CO2: 24 mmol/L (ref 22–32)
CREATININE: 1.86 mg/dL — AB (ref 0.61–1.24)
Chloride: 103 mmol/L (ref 98–111)
GFR calc Af Amer: 39 mL/min — ABNORMAL LOW (ref >60)
GFR calc non Af Amer: 34 mL/min — ABNORMAL LOW (ref >60)
Glucose, Bld: 103 mg/dL — ABNORMAL HIGH (ref 70–99)
Potassium: 4.4 mmol/L (ref 3.5–5.1)
Sodium: 137 mmol/L (ref 135–145)

## 2018-02-11 MED ORDER — SPIRONOLACTONE 25 MG PO TABS: 12.5000 mg | ORAL_TABLET | Freq: Every day | ORAL | 5 refills | Status: DC

## 2018-02-11 MED ORDER — ASPIRIN 81 MG PO TABS: 81.0000 mg | ORAL_TABLET | Freq: Every day | ORAL | 11 refills | Status: DC

## 2018-02-11 MED ORDER — ROSUVASTATIN CALCIUM 40 MG PO TABS: 40.0000 mg | ORAL_TABLET | Freq: Every day | ORAL | 5 refills | Status: DC

## 2018-02-11 NOTE — Progress Notes (Addendum)
Pre-op Cardiac Surgery  Carotid Findings:  Right 1% to 39% Left WNL Bilateral -Vertebral artery flow is antegrade.  Upper Extremity Right Left  Brachial Pressures 103 Triphasic 107 Triphasic  Radial Waveforms Triphasic Triphasic  Ulnar Waveforms Triphasic Triphasic  Palmar Arch (Allen's Test) Normal Normal   Findings:  Palmar arch evaluation - Doppler waveforms remained normal with both radial and ulnar compressions    Lower  Extremity Right Left  Dorsalis Pedis 82 Monophasic 111 Biphasic  Posterior Tibial 69 Monophasic 111 Monophasic  Ankle/Brachial Indices 0.77 1.04    Findings:  Right ABI indicates a moderate reduction in arterial flow at rest. Left ABI indicates normal arterial flow at rest.  Toma Copier, [place] 02/11/2018, 12:24 PM

## 2018-02-11 NOTE — Progress Notes (Signed)
Advanced Heart Failure Rounding Note  PCP-Cardiologist: No primary care provider on file.   Subjective:    No complaints today, feels good.  Creatinine 1.8 today, Lasix and losartan on hold.    Echo 02/2018: EF 20-25%, severe diffuse HK, mild AI, LA and RA mod dilated, RV normal, trivial TR, mild PI  Cardiac MRI: Moderately dilated LV with EF 22%, diffuse hypokinesis with regional variation.  Normal RV size with mildly decreased systolic function, EF 57%.  Non-coronary pattern LGE at the anterior RV insertion site, there was not a significant amount of coronary disease-type scar present.   Objective:   Weight Range: 63.6 kg Body mass index is 20.12 kg/m.   Vital Signs:   Temp:  [97.8 F (36.6 C)-98.2 F (36.8 C)] 98.2 F (36.8 C) (12/05 2219) Pulse Rate:  [72-77] 77 (12/05 2219) Resp:  [16] 16 (12/05 2219) BP: (96-103)/(59-63) 103/59 (12/05 2219) SpO2:  [100 %] 100 % (12/05 2219) Weight:  [63.6 kg] 63.6 kg (12/05 0800) Last BM Date: 02/10/18  Weight change: Filed Weights   02/08/18 0706 02/08/18 1813 02/10/18 0800  Weight: 68.9 kg 64.9 kg 63.6 kg    Intake/Output:   Intake/Output Summary (Last 24 hours) at 02/11/2018 0747 Last data filed at 02/10/2018 1500 Gross per 24 hour  Intake 1200 ml  Output 1400 ml  Net -200 ml      Physical Exam    General: NAD Neck: No JVD, no thyromegaly or thyroid nodule.  Lungs: Clear to auscultation bilaterally with normal respiratory effort. CV: Lateral PMI.  Heart regular S1/S2, no S3/S4, no murmur.  No peripheral edema.  No carotid bruit.  Normal pedal pulses.  Abdomen: Soft, nontender, no hepatosplenomegaly, no distention.  Skin: Intact without lesions or rashes.  Neurologic: Alert and oriented x 3.  Psych: Normal affect. Extremities: No clubbing or cyanosis.  HEENT: Normal.   Telemetry   NSR in 80s, no ectopy, personally reviewed.   EKG    No new tracings.   Labs    CBC Recent Labs    02/09/18 0253  WBC  5.4  HGB 12.6*  HCT 38.7*  MCV 93.9  PLT 846   Basic Metabolic Panel Recent Labs    02/10/18 2058 02/11/18 0248  NA 138 137  K 4.5 4.4  CL 101 103  CO2 25 24  GLUCOSE 112* 103*  BUN 45* 47*  CREATININE 1.79* 1.86*  CALCIUM 9.6 9.3   Liver Function Tests No results for input(s): AST, ALT, ALKPHOS, BILITOT, PROT, ALBUMIN in the last 72 hours. No results for input(s): LIPASE, AMYLASE in the last 72 hours. Cardiac Enzymes No results for input(s): CKTOTAL, CKMB, CKMBINDEX, TROPONINI in the last 72 hours.  BNP: BNP (last 3 results) No results for input(s): BNP in the last 8760 hours.  ProBNP (last 3 results) No results for input(s): PROBNP in the last 8760 hours.   D-Dimer No results for input(s): DDIMER in the last 72 hours. Hemoglobin A1C No results for input(s): HGBA1C in the last 72 hours. Fasting Lipid Panel Recent Labs    02/09/18 0253  CHOL 171  HDL 49  LDLCALC 109*  TRIG 64  CHOLHDL 3.5   Thyroid Function Tests No results for input(s): TSH, T4TOTAL, T3FREE, THYROIDAB in the last 72 hours.  Invalid input(s): FREET3  Other results:   Imaging    Mr Cardiac Morphology W Wo Contrast  Result Date: 02/11/2018 CLINICAL DATA:  Cardiomyopathy EXAM: CARDIAC MRI TECHNIQUE: The patient was scanned on  a 1.5 Tesla GE magnet. A dedicated cardiac coil was used. Functional imaging was done using Fiesta sequences. 2,3, and 4 chamber views were done to assess for RWMA's. Modified Simpson's rule using a short axis stack was used to calculate an ejection fraction on a dedicated work Conservation officer, nature. The patient received 7 cc of Gadavist. After 10 minutes inversion recovery sequences were used to assess for infiltration and scar tissue. CONTRAST:  7 cc Gadavist FINDINGS: Limited images of the lung fields showed no gross abnormalities. Moderately dilated left ventricle with severe diffuse hypokinesis, looking regionally worse in the anterior and anteroseptal  walls. LVEF 22%. Normal wall thickness. Normal right ventricular size, mildly decreased systolic function with EF 42%. Trileaflet aortic valve with mild aortic insufficiency. Mild mitral regurgitation. Mild tricuspid regurgitation. On delayed enhancement imaging, there was a discrete area of subepicardial late gadolinium enhancement (LGE) in the mid anteroseptal wall at the RV insertion site. There may have been a small area of subendocardial LGE at the true apex but this area was obscured by artifact. Measurements: LVEDV 253 mL LVSV 55 mL LVEF 22% RVEDV 113 mL RVSV 47 mL RVEF 42% IMPRESSION: 1. Moderately dilated LV with EF 22%, diffuse hypokinesis with regional variation. 2.  Normal RV size with mildly decreased systolic function, EF 81%. 3. Nonspecific LGE at the anterior RV insertion site, this can be associated with LV volume/pressure overload. Possible coronary pattern LGE at the true apex but subtle and obscured by artifact. There is not significant coronary pattern scarring present on this scan. Luis Chen Electronically Signed   By: Loralie Champagne M.D.   On: 02/11/2018 07:39     Medications:     Scheduled Medications: . aspirin EC  81 mg Oral Daily  . carvedilol  3.125 mg Oral BID WC  . feeding supplement (ENSURE ENLIVE)  237 mL Oral TID BM  . ferrous gluconate  324 mg Oral BID WC  . heparin  5,000 Units Subcutaneous Q8H  . latanoprost  1 drop Both Eyes QHS  . [START ON 02/12/2018] losartan  25 mg Oral Daily  . rosuvastatin  40 mg Oral q1800  . sodium chloride flush  3 mL Intravenous Q12H  . spironolactone  12.5 mg Oral QHS    Infusions: . sodium chloride      PRN Medications: sodium chloride, acetaminophen, ondansetron (ZOFRAN) IV, sodium chloride flush, traZODone    Patient Profile   Luis Chen is a 77 y.o. male with a history of systolic HF with improved EF from 25% in 2010 -> 50% 2011 due to NICM, nonobstructive CAD, CKD, HL, and HTN.  Had [place] today, which showed  severe multivessel disease with normal output and EF < 25%. HF team consulted to optimize prior to CABG.   Assessment/Plan   1. CAD: Severe 3 vessel disease with occluded RCA.  He has relatively mild anginal symptoms.  Coronary anatomy appears best-suited for CABG.  He did not have significant CAD-type scar on cardiac MRI.  Suspect viable myocardium (though think there may also be a nonischemic cardiomyopathy process present - mixed ischemic/nonischemic cardiomyopathy - so EF may not correct to normal range just with CABG). - Discussed yesterday with Dr. Cyndia Bent, will let him go home and return for CABG next week.  - Continue Crestor 40 mg daily and ASA 81 daily.  2. Acute on chronic systolic CHF:  This may be a mixed cardiomyopathy.  Patient had EF down to 25% in the past without significant  CAD on prior cath, EF then improved over time.  Now EF is back down and there is significant coronary disease, but there may be a nonischemic component as well.  He is not a heavy drinker and there is a family history of CAD but not dilated cardiomyopathy.  PCWP was mildly elevated on initial RHC, cardiac output was preserved.  Echo 02/2018: EF 20-25%, severe diffuse HK.  Cardiac MRI showed EF 22%, relative preservation of RV function (EF 42%) and minimal LGE (only small area of non-coronary pattern LGE at the anterior RV insertion site).  On exam, he is not volume overloaded.  Minimal symptoms.  - Creatinine 1.8, will keep him off Lasix.  - Continue coreg 3.125 mg BID - Continue spiro 12.5 mg daily - He can restart losartan 25 mg daily tomorrow (took at home prior to admission). H/o angioedema with ACEI so not candidate for Entresto.  - Repeat echo 3 months after CABG to determine ICD need (narrow QRS, not CRT candidate).  3. AKI on CKD Stage 3.  Hold losartan today, restart tomorrow, will not send home on Lasix.  4. Disposition: May go home today.  Will need to firm up plan with TCTS for CABG, hopefully next  week per Dr. Cyndia Bent.  Would arrange for followup with CHF clinic in about a month as long as CABG can be arranged for next week.  Meds for home: Coreg 3.125 mg bid, spironolactone 12.5 daily, losartan 25 mg daily, ASA 81, Crestor 40 daily.   Loralie Champagne 02/11/2018 7:47 AM

## 2018-02-11 NOTE — Progress Notes (Signed)
3 Days Post-Op Procedure(s) (LRB): RIGHT/LEFT HEART CATH AND CORONARY ANGIOGRAPHY (N/A) Subjective: No complaints  Objective: Vital signs in last 24 hours: Temp:  [97.5 F (36.4 C)-98.2 F (36.8 C)] 97.5 F (36.4 C) (12/06 0753) Pulse Rate:  [77] 77 (12/06 0753) Cardiac Rhythm: Normal sinus rhythm (12/06 0700) Resp:  [11-16] 11 (12/06 0753) BP: (103-110)/(59-72) 110/72 (12/06 0753) SpO2:  [100 %] 100 % (12/06 0753)  Hemodynamic parameters for last 24 hours:    Intake/Output from previous day: 12/05 0701 - 12/06 0700 In: 1200 [P.O.:1200] Out: 1400 [Urine:1400] Intake/Output this shift: No intake/output data recorded.  General appearance: alert and cooperative Heart: regular rate and rhythm, S1, S2 normal, no murmur, [place], rub or gallop Lungs: clear to auscultation bilaterally  Lab Results: Recent Labs    02/09/18 0253  WBC 5.4  HGB 12.6*  HCT 38.7*  PLT 192   BMET:  Recent Labs    02/10/18 2058 02/11/18 0248  NA 138 137  K 4.5 4.4  CL 101 103  CO2 25 24  GLUCOSE 112* 103*  BUN 45* 47*  CREATININE 1.79* 1.86*  CALCIUM 9.6 9.3    PT/INR: No results for input(s): LABPROT, INR in the last 72 hours. ABG    Component Value Date/Time   PHART 7.294 (L) 02/08/2018 1339   HCO3 23.3 02/08/2018 1339   TCO2 25 02/08/2018 1339   ACIDBASEDEF 3.0 (H) 02/08/2018 1339   O2SAT 96.0 02/08/2018 1339   CBG (last 3)  No results for input(s): GLUCAP in the last 72 hours.  Assessment/Plan:  Severe three-vessel coronary disease with severe left ventricular systolic dysfunction.  I have personally reviewed his 2D echocardiogram from 02/09/2018 and his cardiac MR viability study done yesterday.  His echo shows a left ventricular ejection fraction of 20 to 25% with diffuse hypokinesis.  There is mild aortic insufficiency and no significant mitral regurgitation.  Right ventricular systolic function appears normal.  The cardiac MRI shows a moderately dilated LV with ejection  fraction of 22% and diffuse hypokinesis.  There is evidence of some scar at the apex but there appears to be viable muscle otherwise.  I discussed the case again with Dr. Aundra Dubin and we feel that the best option for this patient would be to proceed with coronary artery bypass graft surgery.  I reviewed the results of the studies with the patient and his daughter and our recommendation for bypass surgery. I discussed the operative procedure with the patient and his daughter including alternatives, benefits and risks; including but not limited to bleeding, blood transfusion, infection, stroke, myocardial infarction, graft failure, heart block requiring a permanent pacemaker, organ dysfunction, and death.  Luis Chen understands and agrees to proceed.    He is going to be discharged later today and will return next Thursday for coronary artery bypass graft surgery.   LOS: 3 days    [name] 02/11/2018

## 2018-02-11 NOTE — Progress Notes (Signed)
CARDIAC REHAB PHASE I   PRE:  Rate/Rhythm: 77 SR    BP: sitting 104/55    SaO2: 98 RA  MODE:  Ambulation: 550 ft   POST:  Rate/Rhythm: 93 SR    BP: sitting 110/60     SaO2: 99 RA  Pt ambulated hallway independently without sx. Denied SOB or chest tightness/indigestion. VSS. Discussed sternal precautions, IS (1000 mL), mobility post op, and d/c planning with pt and daughter. They are contemplating d/c plan as she lives in [place] and works. His nephew lives with him but also works. Gave pt reminders for sodium restrictions and daily wts. He sts he has not had any fluid retention. Reminded to call MD or 911 if any sx before surgery. [phone]   [place], ACSM 02/11/2018 10:16 AM

## 2018-02-11 NOTE — Care Management Important Message (Signed)
Important Message  Patient Details  Name: Luis Chen MRN: [id number] Date of Birth: 1940-11-14   Medicare Important Message Given:  Yes    Stavros Cail 02/11/2018, 4:08 PM

## 2018-02-11 NOTE — Discharge Summary (Signed)
Advanced Heart Failure Discharge Note  Discharge Summary   Patient ID: Luis Chen MRN: [id number], DOB/AGE: 1940-07-15 77 y.o. Admit date: 02/08/2018 D/C date:     02/11/2018   Primary Discharge Diagnoses:  1. CAD: Severe 3 vessel disease with occluded RCA. 2. Acute on chronic systolic CHF 3. AKI on CKD Stage 3  Hospital Course:   Luis Chen is a 77 y.o. male with a history of systolic HF with improved EF from 25% in 2010 -> 50%2011 due to NICM,nonobstructiveCAD, CKD, HL, and HTN.  Went for [facility] 02/08/18 LHC which showed severe multivessel disease with normal output and EF<25%. HF team consulted to optimize prior to CABG.TCTS followed as well.   Problem based hospital course as below.   1. CAD: Severe 3 vessel disease with occluded RCA with relatively mild anginal symptoms. Coronary anatomy appears best-suited for CABG.  He did not have significant CAD-type scar on cardiac MRI.  Suspect viable myocardium (though think there may also be a nonischemic cardiomyopathy process present - mixed ischemic/nonischemic cardiomyopathy - so EF may not correct to normal range just with CABG). - Dr. Aundra Dubin discussed in depth with Dr. Cyndia Bent, will let him go home and return for CABG next week.  2. Acute on chronic systolic CHF:  Suspected mixed cardiomyopathy.  Patient had EF down to 25% in the past without significant CAD on prior cath, EF then improved over time.  Now EF is back down and there is significant coronary disease, but there may be a nonischemic component as well.  He is not a heavy drinker and there is a family history of CAD but not dilated cardiomyopathy. PCWP was mildly elevated on initial RHC, cardiac output was preserved.  Echo 02/2018: EF 20-25%, severe diffuse HK.  Cardiac MRI showed EF 22%, relative preservation of RV function (EF 42%) and minimal LGE (only small area of non-coronary pattern LGE at the anterior RV insertion site).  On exam, he is not volume overloaded.   Minimal symptoms.  - H/o angioedema with ACEI so not candidate for Entresto.  - Will plan repeat echo 3 months after CABG to determine ICD need (narrow QRS, not CRT candidate).  3. AKI on CKD Stage 3 - Cr up to 1.8 from baseline of 1.3 - 1.5. - Losartan held 02/11/18 with plans to resume 02/12/18. Lasix discontinued.   Pt will tentatively undergo CABG by Dr. Cyndia Bent 02/17/18. TCTS office to contact with follow-up/admission instructions.   Pt examined am of 02/11/18 and determined stable for discharge home with plan for re-admission with CABG next week.   Discharge Weight: 140.2 lbs Discharge Vitals: Blood pressure 110/72, pulse 77, temperature (!) 97.5 F (36.4 C), temperature source Oral, resp. rate 11, height 5\' 10"  (1.778 m), weight 63.6 kg, SpO2 100 %.  Labs: Lab Results  Component Value Date   WBC 5.4 02/09/2018   HGB 12.6 (L) 02/09/2018   HCT 38.7 (L) 02/09/2018   MCV 93.9 02/09/2018   PLT 192 02/09/2018    Recent Labs  Lab 02/11/18 0248  NA 137  K 4.4  CL 103  CO2 24  BUN 47*  CREATININE 1.86*  CALCIUM 9.3  GLUCOSE 103*   Lab Results  Component Value Date   CHOL 171 02/09/2018   HDL 49 02/09/2018   LDLCALC 109 (H) 02/09/2018   TRIG 64 02/09/2018   BNP (last 3 results) No results for input(s): BNP in the last 8760 hours.  ProBNP (last 3 results) No results for input(s):  PROBNP in the last 8760 hours.   Diagnostic Studies/Procedures   Mr Cardiac Morphology W Wo Contrast  Result Date: 02/11/2018 CLINICAL DATA:  Cardiomyopathy EXAM: CARDIAC MRI TECHNIQUE: The patient was scanned on a 1.5 Tesla GE magnet. A dedicated cardiac coil was used. Functional imaging was done using Fiesta sequences. 2,3, and 4 chamber views were done to assess for RWMA's. Modified Simpson's rule using a short axis stack was used to calculate an ejection fraction on a dedicated work Conservation officer, nature. The patient received 7 cc of Gadavist. After 10 minutes inversion recovery  sequences were used to assess for infiltration and scar tissue. CONTRAST:  7 cc Gadavist FINDINGS: Limited images of the lung fields showed no gross abnormalities. Moderately dilated left ventricle with severe diffuse hypokinesis, looking regionally worse in the anterior and anteroseptal walls. LVEF 22%. Normal wall thickness. Normal right ventricular size, mildly decreased systolic function with EF 42%. Trileaflet aortic valve with mild aortic insufficiency. Mild mitral regurgitation. Mild tricuspid regurgitation. On delayed enhancement imaging, there was a discrete area of subepicardial late gadolinium enhancement (LGE) in the mid anteroseptal wall at the RV insertion site. There may have been a small area of subendocardial LGE at the true apex but this area was obscured by artifact. Measurements: LVEDV 253 mL LVSV 55 mL LVEF 22% RVEDV 113 mL RVSV 47 mL RVEF 42% IMPRESSION: 1. Moderately dilated LV with EF 22%, diffuse hypokinesis with regional variation. 2.  Normal RV size with mildly decreased systolic function, EF 78%. 3. Nonspecific LGE at the anterior RV insertion site, this can be associated with LV volume/pressure overload. Possible coronary pattern LGE at the true apex but subtle and obscured by artifact. There is not significant coronary pattern scarring present on this scan. Dalton Mclean Electronically Signed   By: Loralie Champagne M.D.   On: 02/11/2018 07:39   Vas US Doppler Pre Cabg  Result Date: 02/11/2018 PREOPERATIVE VASCULAR EVALUATION  Indications:  Pre CABG. Risk Factors: Coronary artery disease. Performing Technologist: Birdena Crandall, [place] [facility]  Examination Guidelines: A complete evaluation includes B-mode imaging, spectral Doppler, color Doppler, and power Doppler as needed of all accessible portions of each vessel. Bilateral testing is considered an integral part of a complete examination. Limited examinations for reoccurring indications may be performed as noted.  Right Carotid Findings:  +----------+--------+--------+--------+---------------------+------------------+           PSV cm/sEDV cm/sStenosisDescribe             Comments           +----------+--------+--------+--------+---------------------+------------------+ CCA Prox  87      12                                                      +----------+--------+--------+--------+---------------------+------------------+ CCA Distal80      12                                   intimal thickening +----------+--------+--------+--------+---------------------+------------------+ ICA Prox  44      8               heterogenous and     minute plaque  focal                                   +----------+--------+--------+--------+---------------------+------------------+ ICA Mid   45      8                                                       +----------+--------+--------+--------+---------------------+------------------+ ICA Distal51      12                                                      +----------+--------+--------+--------+---------------------+------------------+ ECA       47      5                                                       +----------+--------+--------+--------+---------------------+------------------+ Portions of this table do not appear on this page. +----------+--------+-------+--------+------------+           PSV cm/sEDV cmsDescribeArm Pressure +----------+--------+-------+--------+------------+ Subclavian98                     103          +----------+--------+-------+--------+------------+ +---------+--------+--+--------+-+ VertebralPSV cm/s42EDV cm/s9 +---------+--------+--+--------+-+ Left Carotid Findings: +----------+--------+--------+--------+--------+------------------+           PSV cm/sEDV cm/sStenosisDescribeComments           +----------+--------+--------+--------+--------+------------------+ CCA Prox   132     7                       intimal thickening +----------+--------+--------+--------+--------+------------------+ CCA Distal78      2                       intimal thickening +----------+--------+--------+--------+--------+------------------+ ICA Prox  72      17                                         +----------+--------+--------+--------+--------+------------------+ ICA Mid   61      20                                         +----------+--------+--------+--------+--------+------------------+ ICA Distal73      20                      tortuous           +----------+--------+--------+--------+--------+------------------+ ECA       41      2                       intimal thickening +----------+--------+--------+--------+--------+------------------+ +----------+--------+--------+--------+------------+ SubclavianPSV cm/sEDV cm/sDescribeArm Pressure +----------+--------+--------+--------+------------+           132  107          +----------+--------+--------+--------+------------+ +---------+--------+--+--------+-+ VertebralPSV cm/s51EDV cm/s7 +---------+--------+--+--------+-+  ABI Findings: +--------+------------------+-----+----------+--------+ Right   Rt Pressure (mmHg)IndexWaveform  Comment  +--------+------------------+-----+----------+--------+ [id number]                    triphasic          +--------+------------------+-----+----------+--------+ PTA     69                0.64 monophasic         +--------+------------------+-----+----------+--------+ DP      82                0.77 monophasic         +--------+------------------+-----+----------+--------+ +--------+------------------+-----+----------+-------+ Left    Lt Pressure (mmHg)IndexWaveform  Comment +--------+------------------+-----+----------+-------+ [id number]                    triphasic          +--------+------------------+-----+----------+-------+ PTA     111               1.04 biphasic          +--------+------------------+-----+----------+-------+ DP      111                    monophasic        +--------+------------------+-----+----------+-------+ +-------+---------------+----------------+ ABI/TBIToday's ABI/TBIPrevious ABI/TBI +-------+---------------+----------------+ Right  0.77                            +-------+---------------+----------------+ Left   1.04                            +-------+---------------+----------------+  Right Doppler Findings: +-----------+--------+-----+---------+--------+ Site       PressureIndexDoppler  Comments +-----------+--------+-----+---------+--------+ Brachial   103          triphasic         +-----------+--------+-----+---------+--------+ Radial                  triphasic         +-----------+--------+-----+---------+--------+ Ulnar                   triphasic         +-----------+--------+-----+---------+--------+ Palmar Arch                      normal   +-----------+--------+-----+---------+--------+  Left Doppler Findings: +-----------+--------+-----+---------+--------+ Site       PressureIndexDoppler  Comments +-----------+--------+-----+---------+--------+ Brachial   107          triphasic         +-----------+--------+-----+---------+--------+ Radial                  triphasic         +-----------+--------+-----+---------+--------+ Ulnar                   triphasic         +-----------+--------+-----+---------+--------+ Palmar Arch                      normal   +-----------+--------+-----+---------+--------+  Summary: Right Carotid: Velocities in the right ICA are consistent with a 1-39% stenosis. Left Carotid: There is no evidence of stenosis in the left ICA. Vertebrals:  Bilateral vertebral arteries demonstrate antegrade flow. Subclavians: Normal flow hemodynamics were  seen in bilateral subclavian  arteries. Right ABI: Resting right ankle-brachial index is within normal range. No evidence of significant right lower extremity arterial disease. Left ABI: Resting left ankle-brachial index indicates moderate left lower extremity arterial disease. Right Upper Extremity: Doppler waveforms remain within normal limits with right radial compression. Doppler waveforms remain within normal limits with right ulnar compression. Left Upper Extremity: Doppler waveforms remain within normal limits with left radial compression. Doppler waveforms remain within normal limits with left ulnar compression.     Preliminary     Discharge Medications   Allergies as of 02/11/2018      Reactions   Ace Inhibitors Swelling   Facial swelling       Medication List    STOP taking these medications   pravastatin 80 MG tablet Commonly known as:  PRAVACHOL     TAKE these medications   aspirin 81 MG tablet Take 1 tablet (81 mg total) by mouth daily. What changed:  when to take this   carvedilol 3.125 MG tablet Commonly known as:  COREG Take 1 tablet (3.125 mg total) by mouth 2 (two) times daily with a meal.   feeding supplement (ENSURE ENLIVE) Liqd Take 237 mLs by mouth 3 (three) times daily between meals. What changed:    when to take this  reasons to take this   Ferrous Gluconate 324 (37.5 Fe) MG Tabs TAKE 1 TABLET BY MOUTH TWICE DAILY WITH MEALS What changed:  when to take this   furosemide 40 MG tablet Commonly known as:  LASIX Take 0.5 tablets (20 mg total) by mouth daily as needed for fluid or edema (for weight gain of more than 3 Lbs in 1 day).   latanoprost 0.005 % ophthalmic solution Commonly known as:  XALATAN Place 1 drop into both eyes at bedtime.   losartan 25 MG tablet Commonly known as:  COZAAR Take 25 mg by mouth daily.   rosuvastatin 40 MG tablet Commonly known as:  CRESTOR Take 1 tablet (40 mg total) by mouth daily at 6 PM.     spironolactone 25 MG tablet Commonly known as:  ALDACTONE Take 0.5 tablets (12.5 mg total) by mouth at bedtime.   traZODone 50 MG tablet Commonly known as:  DESYREL Take 75 mg by mouth at bedtime as needed for sleep.   trolamine salicylate 10 % cream Commonly known as:  ASPERCREME Apply 1 application topically 3 (three) times daily as needed (for arthritis pain.).       Disposition   The patient will be discharged in stable condition to home. Discharge Instructions    (HEART FAILURE PATIENTS) Call MD:  Anytime you have any of the following symptoms: 1) 3 pound weight gain in 24 hours or 5 pounds in 1 week 2) shortness of breath, with or without a dry hacking cough 3) swelling in the hands, feet or stomach 4) if you have to sleep on extra pillows at night in order to breathe.   Complete by:  As directed    Call MD for:  persistant dizziness or light-headedness   Complete by:  As directed    Call MD for:  redness, tenderness, or signs of infection (pain, swelling, redness, odor or green/yellow discharge around incision site)   Complete by:  As directed    Call MD for:  temperature >100.4   Complete by:  As directed    Diet - low sodium heart healthy   Complete by:  As directed    Heart Failure patients record your daily weight using  the same scale at the same time of day   Complete by:  As directed    Increase activity slowly   Complete by:  As directed    STOP any activity that causes chest pain, shortness of breath, dizziness, sweating, or exessive weakness   Complete by:  As directed      Follow-up Information    [facility] Follow up on 03/11/2018.   Specialty:  Cardiology Why:  10:30 am. Follow up in heart failure clinic. [facility] is off of [address] under the hospital. Follow sign for Heart and Vascular parking (near corner of [address] and [address]). Garage code [phone] in January. Take all medications and bring with you to  appointment Contact information: [address] [id number] [place]       [name], MD Follow up.   Specialty:  Cardiothoracic Surgery Why:  Dr Vivi Martens office will call you with further instructions for admission for surgery next week. If you do not hear from them, please call them.  Contact information: [place] [address] [phone]             Duration of Discharge Encounter: Greater than 35 minutes   Signed, Shirley Friar, PA-C 02/11/2018, 1:50 PM

## 2018-02-14 ENCOUNTER — Telehealth: Payer: Self-pay | Admitting: *Deleted

## 2018-02-14 NOTE — Telephone Encounter (Signed)
[facility] study telephone follow up. I spoke with patient during most recent admission about participation. He stated he was interested in participating. I attempted to follow up prior to discharge but he was preop testing each time I returned to his room.  He now wants to speak with surgeon or office nurse to make sure its okay for him to participate. I explained that this study was being conducted by the surgeons request.  Patient is having his pre op work on Wednesday 02/16/18 and states we can speak with him at that time. He will reach out to Dr. Vivi Martens office and if everything is okay we can move forward Wednesday with consenting and labs for research study. I thanked him for allowing [place] to speak with him.

## 2018-02-15 NOTE — Pre-Procedure Instructions (Signed)
Luis Chen  [birth year]      [place], [place] [address] [place] [place] [address] Phone: [phone] Fax: [phone]    Your procedure is scheduled on Thursday December 12th.  Report to [facility] Admitting at 5:30 A.M.  Call this number if you have problems the morning of surgery:  [phone]   Remember:  Do not eat or drink after midnight.    Take these medicines the morning of surgery with A SIP OF WATER  carvedilol (COREG)   Follow your surgeon's instructions on when to stop Asprin.  If no instructions were given by your surgeon then you will need to call the office to get those instructions.    7 days prior to surgery STOP taking any Aspirin(unless otherwise instructed by your surgeon), Aleve, Naproxen, Ibuprofen, Motrin, Advil, Goody's, BC's, all herbal medications, fish oil, and all vitamins     Do not wear jewelry.  Do not wear lotions, powders, or colognes, or deodorant.  Do not shave 48 hours prior to surgery.  Men may shave face and neck.  Do not bring valuables to the hospital.  [facility] is not responsible for any belongings or valuables.  Contacts, eyeglasses, hearing aids, dentures or bridgework may not be worn into surgery.  Leave your suitcase in the car.  After surgery it may be brought to your room.  For patients admitted to the hospital, discharge time will be determined by your treatment team.  Patients discharged the day of surgery will not be allowed to drive home.   [facility]- Preparing For Surgery  Before surgery, you can play an important role. Because skin is not sterile, your skin needs to be as free of germs as possible. You can reduce the number of germs on your skin by washing with CHG (chlorahexidine gluconate) Soap before surgery.  CHG is an antiseptic cleaner which kills germs and bonds with the skin to continue killing germs even after washing.    Oral  Hygiene is also important to reduce your risk of infection.  Remember - BRUSH YOUR TEETH THE MORNING OF SURGERY WITH YOUR REGULAR TOOTHPASTE  Please do not use if you have an allergy to CHG or antibacterial soaps. If your skin becomes reddened/irritated stop using the CHG.  Do not shave (including legs and underarms) for at least 48 hours prior to first CHG shower. It is OK to shave your face.  Please follow these instructions carefully.   1. Shower the NIGHT BEFORE SURGERY and the MORNING OF SURGERY with CHG.   2. If you chose to wash your hair, wash your hair first as usual with your normal shampoo.  3. After you shampoo, rinse your hair and body thoroughly to remove the shampoo.  4. Use CHG as you would any other liquid soap. You can apply CHG directly to the skin and wash gently with a scrungie or a clean washcloth.   5. Apply the CHG Soap to your body ONLY FROM THE NECK DOWN.  Do not use on open wounds or open sores. Avoid contact with your eyes, ears, mouth and genitals (private parts). Wash Face and genitals (private parts)  with your normal soap.  6. Wash thoroughly, paying special attention to the area where your surgery will be performed.  7. Thoroughly rinse your body with warm water from the neck down.  8. DO NOT shower/wash with your normal soap after using and rinsing off  the CHG Soap.  9. Pat yourself dry with a CLEAN TOWEL.  10. Wear CLEAN PAJAMAS to bed the night before surgery, wear comfortable clothes the morning of surgery  11. Place CLEAN SHEETS on your bed the night of your first shower and DO NOT SLEEP WITH PETS.    Day of Surgery: Shower as stated above. Do not apply any deodorants/lotions.  Please wear clean clothes to the hospital/surgery center.   Remember to brush your teeth WITH YOUR REGULAR TOOTHPASTE.   Please read over the following fact sheets that you were given.

## 2018-02-16 ENCOUNTER — Encounter (HOSPITAL_COMMUNITY)
Admission: RE | Admit: 2018-02-16 | Discharge: 2018-02-16 | Disposition: A | Discharge status: Home / Self Care | Payer: Medicare Other | Source: Ambulatory Visit | Attending: Surgery | Admitting: Surgery

## 2018-02-16 ENCOUNTER — Other Ambulatory Visit: Payer: Self-pay

## 2018-02-16 ENCOUNTER — Encounter (HOSPITAL_COMMUNITY): Payer: Self-pay

## 2018-02-16 ENCOUNTER — Ambulatory Visit (HOSPITAL_COMMUNITY)
Admission: RE | Admit: 2018-02-16 | Discharge: 2018-02-16 | Disposition: A | Discharge status: Home / Self Care | Payer: Medicare Other | Source: Ambulatory Visit | Attending: Surgery | Admitting: Surgery

## 2018-02-16 DIAGNOSIS — I2510 Atherosclerotic heart disease of native coronary artery without angina pectoris: Secondary | ICD-10-CM

## 2018-02-16 LAB — PULMONARY FUNCTION TEST
DL/VA % PRED: 91 %
DL/VA: 4.18 ml/min/mmHg/L
DLCO cor % pred: 52 %
DLCO cor: 16.89 ml/min/mmHg
DLCO unc % pred: 49 %
DLCO unc: 15.97 ml/min/mmHg
FEF 25-75 Post: 1.85 L/sec
FEF 25-75 Pre: 1.47 L/sec
FEF2575-%Change-Post: 25 %
FEF2575-%Pred-Post: 87 %
FEF2575-%Pred-Pre: 69 %
FEV1-%Change-Post: 5 %
FEV1-%Pred-Post: 74 %
FEV1-%Pred-Pre: 70 %
FEV1-Post: 2 L
FEV1-Pre: 1.9 L
FEV1FVC-%Change-Post: 5 %
FEV1FVC-%Pred-Pre: 100 %
FEV6-%Change-Post: 0 %
FEV6-%Pred-Post: 71 %
FEV6-%Pred-Pre: 71 %
FEV6-Post: 2.49 L
FEV6-Pre: 2.46 L
FEV6FVC-%Change-Post: 1 %
FEV6FVC-%PRED-POST: 105 %
FEV6FVC-%Pred-Pre: 103 %
FVC-%Change-Post: 0 %
FVC-%Pred-Post: 68 %
FVC-%Pred-Pre: 68 %
FVC-Post: 2.5 L
FVC-Pre: 2.52 L
PRE FEV1/FVC RATIO: 75 %
PRE FEV6/FVC RATIO: 98 %
Post FEV1/FVC ratio: 80 %
Post FEV6/FVC ratio: 99 %
RV % pred: 83 %
RV: 2.19 L
TLC % pred: 65 %
TLC: 4.62 L

## 2018-02-16 LAB — CBC
HCT: 39.9 % (ref 39.0–52.0)
Hemoglobin: 12.8 g/dL — ABNORMAL LOW (ref 13.0–17.0)
MCH: 31 pg (ref 26.0–34.0)
MCHC: 32.1 g/dL (ref 30.0–36.0)
MCV: 96.6 fL (ref 80.0–100.0)
NRBC: 0 % (ref 0.0–0.2)
PLATELETS: 170 10*3/uL (ref 150–400)
RBC: 4.13 MIL/uL — ABNORMAL LOW (ref 4.22–5.81)
RDW: 13.9 % (ref 11.5–15.5)
WBC: 5.3 10*3/uL (ref 4.0–10.5)

## 2018-02-16 LAB — COMPREHENSIVE METABOLIC PANEL
ALT: 17 U/L (ref 0–44)
ANION GAP: 11 (ref 5–15)
AST: 21 U/L (ref 15–41)
Albumin: 3.7 g/dL (ref 3.5–5.0)
Alkaline Phosphatase: 74 U/L (ref 38–126)
BUN: 33 mg/dL — ABNORMAL HIGH (ref 8–23)
CHLORIDE: 109 mmol/L (ref 98–111)
CO2: 18 mmol/L — ABNORMAL LOW (ref 22–32)
Calcium: 9.4 mg/dL (ref 8.9–10.3)
Creatinine, Ser: 1.49 mg/dL — ABNORMAL HIGH (ref 0.61–1.24)
GFR calc Af Amer: 51 mL/min — ABNORMAL LOW (ref >60)
GFR calc non Af Amer: 44 mL/min — ABNORMAL LOW (ref >60)
Glucose, Bld: 102 mg/dL — ABNORMAL HIGH (ref 70–99)
Potassium: 4.5 mmol/L (ref 3.5–5.1)
Sodium: 138 mmol/L (ref 135–145)
Total Bilirubin: 0.7 mg/dL (ref 0.3–1.2)
Total Protein: 7.2 g/dL (ref 6.5–8.1)

## 2018-02-16 LAB — SURGICAL PCR SCREEN
MRSA, PCR: NEGATIVE
Staphylococcus aureus: NEGATIVE

## 2018-02-16 LAB — URINALYSIS, ROUTINE W REFLEX MICROSCOPIC
Bilirubin Urine: NEGATIVE
Glucose, UA: NEGATIVE mg/dL
Hgb urine dipstick: NEGATIVE
Ketones, ur: NEGATIVE mg/dL
LEUKOCYTES UA: NEGATIVE
Nitrite: NEGATIVE
PH: 5 (ref 5.0–8.0)
Protein, ur: NEGATIVE mg/dL
Specific Gravity, Urine: 1.015 (ref 1.005–1.030)

## 2018-02-16 LAB — APTT: aPTT: 30 seconds (ref 24–36)

## 2018-02-16 LAB — BLOOD GAS, ARTERIAL
Acid-base deficit: 3.6 mmol/L — ABNORMAL HIGH (ref 0.0–2.0)
Bicarbonate: 20.6 mmol/L (ref 20.0–28.0)
Drawn by: 265211
FIO2: 21
O2 Saturation: 97.3 %
Patient temperature: 98.6
pCO2 arterial: 35.3 mmHg (ref 32.0–48.0)
pH, Arterial: 7.384 (ref 7.350–7.450)
pO2, Arterial: 101 mmHg (ref 83.0–108.0)

## 2018-02-16 LAB — PROTIME-INR
INR: 1.13
Prothrombin Time: 14.4 seconds (ref 11.4–15.2)

## 2018-02-16 LAB — HEMOGLOBIN A1C
Hgb A1c MFr Bld: 5.7 % — ABNORMAL HIGH (ref 4.8–5.6)
Mean Plasma Glucose: 116.89 mg/dL

## 2018-02-16 MED ORDER — VANCOMYCIN HCL 10 G IV SOLR
1250.0000 mg | INTRAVENOUS | Status: AC
  Administered 2018-02-17: 1250 mg via INTRAVENOUS
  Filled 2018-02-16: qty 1250

## 2018-02-16 MED ORDER — NITROGLYCERIN IN D5W 200-5 MCG/ML-% IV SOLN
2.0000 ug/min | INTRAVENOUS | Status: DC
  Filled 2018-02-16: qty 250

## 2018-02-16 MED ORDER — SODIUM CHLORIDE 0.9 % IV SOLN
1.5000 g | INTRAVENOUS | Status: AC
  Administered 2018-02-17: 1.5 g via INTRAVENOUS
  Filled 2018-02-16: qty 1.5

## 2018-02-16 MED ORDER — TRANEXAMIC ACID 1000 MG/10ML IV SOLN
1.5000 mg/kg/h | INTRAVENOUS | Status: AC
  Administered 2018-02-17: 1.5 mg/kg/h via INTRAVENOUS
  Filled 2018-02-16: qty 25

## 2018-02-16 MED ORDER — ALBUTEROL SULFATE (2.5 MG/3ML) 0.083% IN NEBU
2.5000 mg | INHALATION_SOLUTION | Freq: Once | RESPIRATORY_TRACT | Status: AC
  Administered 2018-02-16: 2.5 mg via RESPIRATORY_TRACT

## 2018-02-16 MED ORDER — MAGNESIUM SULFATE 50 % IJ SOLN
40.0000 meq | INTRAMUSCULAR | Status: DC
  Filled 2018-02-16: qty 9.85

## 2018-02-16 MED ORDER — DEXMEDETOMIDINE HCL IN NACL 400 MCG/100ML IV SOLN
0.1000 ug/kg/h | INTRAVENOUS | Status: DC
  Filled 2018-02-16 (×2): qty 100

## 2018-02-16 MED ORDER — MILRINONE LACTATE IN DEXTROSE 20-5 MG/100ML-% IV SOLN
0.3000 ug/kg/min | INTRAVENOUS | Status: DC
  Filled 2018-02-16: qty 100

## 2018-02-16 MED ORDER — TRANEXAMIC ACID (OHS) PUMP PRIME SOLUTION
2.0000 mg/kg | INTRAVENOUS | Status: DC
  Filled 2018-02-16: qty 1.27

## 2018-02-16 MED ORDER — PLASMA-LYTE 148 IV SOLN
INTRAVENOUS | Status: DC
  Filled 2018-02-16: qty 2.5

## 2018-02-16 MED ORDER — POTASSIUM CHLORIDE 2 MEQ/ML IV SOLN
80.0000 meq | INTRAVENOUS | Status: DC
  Filled 2018-02-16: qty 40

## 2018-02-16 MED ORDER — SODIUM CHLORIDE 0.9 % IV SOLN
750.0000 mg | INTRAVENOUS | Status: AC
  Administered 2018-02-17: 750 mg via INTRAVENOUS
  Filled 2018-02-16: qty 750

## 2018-02-16 MED ORDER — INSULIN REGULAR(HUMAN) IN NACL 100-0.9 UT/100ML-% IV SOLN
INTRAVENOUS | Status: DC
  Filled 2018-02-16: qty 100

## 2018-02-16 MED ORDER — EPINEPHRINE PF 1 MG/ML IJ SOLN
0.0000 ug/min | INTRAVENOUS | Status: DC
  Filled 2018-02-16: qty 4

## 2018-02-16 MED ORDER — SODIUM CHLORIDE 0.9 % IV SOLN
INTRAVENOUS | Status: DC
  Filled 2018-02-16: qty 30

## 2018-02-16 MED ORDER — DOPAMINE-DEXTROSE 3.2-5 MG/ML-% IV SOLN
0.0000 ug/kg/min | INTRAVENOUS | Status: DC
  Filled 2018-02-16: qty 250

## 2018-02-16 MED ORDER — PHENYLEPHRINE HCL-NACL 20-0.9 MG/250ML-% IV SOLN
30.0000 ug/min | INTRAVENOUS | Status: DC
  Filled 2018-02-16 (×2): qty 250

## 2018-02-16 MED ORDER — TRANEXAMIC ACID (OHS) BOLUS VIA INFUSION
15.0000 mg/kg | INTRAVENOUS | Status: AC
  Administered 2018-02-17: 954 mg via INTRAVENOUS
  Filled 2018-02-16: qty 954

## 2018-02-16 NOTE — H&P (Signed)
[address]       [place] [address]             [phone]      Cardiothoracic Surgery Admission History and Physical   Reason for Admission: Severe three-vessel coronary disease with severe left ventricular systolic dysfunction  Referring Physician: Dr. Loralie Champagne    HPI:  The patient is a 77 year old gentleman with a history of hypertension, hyperlipidemia, chronic kidney disease, cardiomyopathy and congestive heart failure, and newly diagnosed coronary artery disease who was referred for evaluation for coronary artery bypass graft surgery. The patient has a history of nonischemic cardiomyopathy dating back to 2010 when his ejection fraction was noted to be 25%. He said that at that time he presented with exertional shortness of breath. Cardiac catheterization showed nonobstructive coronary disease. He was treated with medical therapy and his ejection fraction increased to 50% in 2011. He has done well over the years and said that in August 2019 his PCP asked him to see Dr. Percival Spanish for follow-up since he has not seen him in many years. A 2D echocardiogram was done on 10/28/2017 which was read as an ejection fraction of 55 to 60% although apparently Dr. Percival Spanish thought it was more like 45%. There was hypokinesis of the basal-mid inferior lateral myocardium. There is moderate aortic insufficiency with a pressure half-time of 401 ms. There is trivial mitral regurgitation. Patient said that at that time he was asymptomatic. Then about 1 month ago he developed epigastric and lower chest discomfort that he described as an aching pressure sensation. He had no shortness of breath or orthopnea. His stamina has been good and he has been bowling couple days per week without any difficulty. He underwent a nuclear stress test on 01/19/2018 which showed an ejection fraction of 16% by nuclear stress. There were no ST segment changes during stress. There were multiple defects  involving the anterior inferior and septal walls. These findings were consistent with prior myocardial infarction with mild peri-infarct ischemia in the anteroseptal region. He underwent right and left heart cath yesterday which showed severe three-vessel coronary disease. The distal left main had 60% stenosis. The ostial proximal LAD had 70% stenosis. The mid LAD had 90% stenosis and was a large vessel that wraps around the apex. The left circumflex had 50 to 60% stenosis involving the bifurcation into 2 marginal branches. The right coronary artery was occluded proximally with filling of the distal vessel by collaterals which was relatively small. The left ventricular ejection fraction was less than 25% visually. Cardiac index was 2.6. Pulmonary artery pressures were mildly elevated at 37/16 with a mean wedge pressure of 23. LVEDP was 27. Mean right atrial pressure was 4.  The patient was admitted for cardiac surgery evaluation and consultation with advanced heart failure given his poor ejection fraction.  A repeat echocardiogram on 02/09/2018 showed an ejection fraction of 20 to 25% with severe diffuse hypokinesis.  There is mild aortic insufficiency.  There is no significant mitral regurgitation.  He subsequently underwent a cardiac MR study to assess viability on 02/10/2018.  This showed a left ventricular ejection fraction of 22% with diffuse hypokinesis with regional variation.  The RV had mildly decreased systolic function with ejection fraction of 42%.  There is evidence of some scar at the apex but otherwise there appeared to be viable muscle.  The patient said that he has felt well except for the epigastric and lower chest discomfort. He continues to  deny any shortness of breath or orthopnea. He has had no peripheral edema. His stamina has been good. He bowls several days per week and also works for [employer] part time. He lives with his grandson in [place]. He is a [place] veteran with agent orange  exposure and has 70% disability.       Past Medical History:  Diagnosis Date  . CAD (coronary artery disease)    non obstructive. Left main normal. LAd proximal long 25% stenosis, termingating as focal 50% prox to mid lesion. First & second diag were small, normal. Circumflex in proximal av groove had luminal irregularities.. Was large mid obtuse marg which was branching, scattered luminal irregul. Infer branch did supply some septal perforators. Was PDA,small & normal. < 1.33mm. There was prox 95% stenosis  . Cardiomyopathy    EF was 25% improved to 50%.   . CHF (congestive heart failure) ([place])   . CKD (chronic kidney disease)   . Hyperlipidemia    transient history, controlled on diet  . Hypertension         Past Surgical History:  Procedure Laterality Date  . KNEE ARTHROSCOPY Left 07/01/2015   Procedure: Irrigation and debridement left knee arthroscopy; Surgeon: Renette Butters, MD; Location: [facility]; Service: Orthopedics; Laterality: Left;  . RIGHT/LEFT HEART CATH AND CORONARY ANGIOGRAPHY N/A 02/08/2018   Procedure: RIGHT/LEFT HEART CATH AND CORONARY ANGIOGRAPHY; Surgeon: Jettie Booze, MD; Location: [facility]; Service: Cardiovascular; Laterality: N/A;        Family History  Problem Relation Age of Onset  . Diabetes Mother   . Arthritis Mother   . Heart disease Mother   . Hyperlipidemia Mother   . Hypertension Mother   . Heart attack Mother 61  . Heart disease Brother    had in his 22s. Had CABG x2  . Hypothyroidism Daughter   . Stroke Daughter    Social History: reports that he has never smoked. He has never used smokeless tobacco. He reports that he drinks alcohol. He reports that he does not use drugs.  Allergies:       Allergies  Allergen Reactions  . Ace Inhibitors Swelling    Facial swelling    Medications:  I have reviewed the patient's current medications.  Prior to Admission:         Medications Prior to Admission  Medication Sig Dispense  Refill Last Dose  . aspirin 81 MG tablet Take 81 mg by mouth 2 (two) times daily.    02/08/2018 at 0630  . carvedilol (COREG) 3.125 MG tablet Take 1 tablet (3.125 mg total) by mouth 2 (two) times daily with a meal. 180 tablet 3 02/07/2018 at Unknown time  . furosemide (LASIX) 40 MG tablet Take 0.5 tablets (20 mg total) by mouth daily as needed for fluid or edema (for weight gain of more than 3 Lbs in 1 day). 30 tablet 0 02/07/2018 at Unknown time  . latanoprost (XALATAN) 0.005 % ophthalmic solution Place 1 drop into both eyes at bedtime.    02/07/2018 at Unknown time  . losartan (COZAAR) 25 MG tablet Take 25 mg by mouth daily.    02/07/2018 at Unknown time  . pravastatin (PRAVACHOL) 80 MG tablet Take 80 mg by mouth daily.   02/07/2018 at Unknown time  . traZODone (DESYREL) 50 MG tablet Take 75 mg by mouth at bedtime as needed for sleep.   02/07/2018 at Unknown time  . trolamine salicylate (ASPERCREME) 10 % cream Apply 1 application topically  3 (three) times daily as needed (for arthritis pain.).   Past Month at Unknown time  . feeding supplement, ENSURE ENLIVE, (ENSURE ENLIVE) LIQD Take 237 mLs by mouth 3 (three) times daily between meals. (Patient taking differently: Take 237 mLs by mouth daily as needed (for nutritional support). ) 237 mL 12 More than a month at Unknown time   Scheduled:  . aspirin EC 81 mg Oral Daily  . carvedilol 3.125 mg Oral BID WC  . feeding supplement (ENSURE ENLIVE) 237 mL Oral TID BM  . ferrous gluconate 324 mg Oral BID WC  . [START ON 02/10/2018] furosemide 20 mg Oral Daily  . heparin 5,000 Units Subcutaneous Q8H  . [START ON 02/10/2018] Influenza vac split quadrivalent PF 0.5 mL Intramuscular Tomorrow-1000  . latanoprost 1 drop Both Eyes QHS  . losartan 25 mg Oral Daily  . rosuvastatin 40 mg Oral q1800  . sodium chloride flush 3 mL Intravenous Q12H  . spironolactone 12.5 mg Oral QHS   Continuous:  . sodium chloride    YFR:TMYTRZ chloride, acetaminophen, ondansetron  (ZOFRAN) IV, sodium chloride flush, traZODone     Anti-infectives (From admission, onward)    None      Lab Results Last 48 Hours  Imaging Results (Last 48 hours)     Review of Systems  Constitutional: Negative for chills, diaphoresis, fever, malaise/fatigue and weight loss.  HENT: Negative.  Eyes: Negative.  Respiratory: Negative for shortness of breath.  Cardiovascular: Positive for chest pain. Negative for palpitations, orthopnea, leg swelling and PND.  Gastrointestinal: Negative. Negative for nausea and vomiting.  Epigastric pain  Genitourinary: Negative.  Musculoskeletal: Positive for joint pain.  And swelling  Skin: Negative.  Neurological: Negative.  Endo/Heme/Allergies: Negative.  Psychiatric/Behavioral: Negative.   Blood pressure 98/64, pulse 74, temperature (!) 97.5 F (36.4 C), resp. rate 15, height 5\' 10"  (1.778 m), weight 64.9 kg, SpO2 98 %.  Physical Exam  Constitutional: He is oriented to person, place, and time. He appears well-developed and well-nourished. No distress.  HENT:  Head: Normocephalic and atraumatic.  Mouth/Throat: Oropharynx is clear and moist.  Eyes: Pupils are equal, round, and reactive to light. Conjunctivae and EOM are normal.  Neck: Normal range of motion. Neck supple. No JVD present. No thyromegaly present.  Cardiovascular: Normal rate,  regular rhythm, normal heart sounds and intact distal pulses.  No murmur heard.  Respiratory: Effort normal and breath sounds normal. No respiratory distress. He has no wheezes. He has no rales.  GI: Soft. Bowel sounds are normal. He exhibits no distension and no mass. There is no tenderness.  Musculoskeletal: Normal range of motion. He exhibits no edema.  Lymphadenopathy:  He has no cervical adenopathy.  Neurological: He is alert and oriented to person, place, and time. He has normal strength. No cranial nerve deficit or sensory deficit.  Skin: Skin is warm and dry.  Psychiatric: He has a normal mood and affect.   MYOCARDIAL PERFUSION IMAGING  Study Highlights  The left ventricular ejection fraction is severely decreased (<30%).  Nuclear stress EF: 16%.  There was no ST segment deviation noted during stress.  Defect 1: There is a medium defect of severe severity present in the mid inferoseptal, mid inferior, apical septal and apical inferior location.  Defect 2: There is a medium defect of severe severity present in the mid anterior, mid anteroseptal, apical anterior and apex location.  Defect 3: There is a small defect of moderate severity present in the mid anterolateral and apical lateral location.  Findings consistent with prior myocardial infarction with mild peri-infarct ischemia in the anteroseptal region.  This is a high risk study.  Physicians  Panel Physicians Referring Physician Case Authorizing Physician  Jettie Booze, MD (Primary)    Procedures  RIGHT/LEFT HEART CATH AND CORONARY ANGIOGRAPHY  Conclusion  Mid LAD lesion is 90% stenosed.  Mid LM to Dist LM lesion is 60% stenosed.  Ost LAD lesion is 70% stenosed.  Ost Cx to Prox Cx lesion is 50% stenosed.  Mid Cx lesion is 25% stenosed.  Mid RCA lesion is 100% stenosed. Distal vessel fills by brisk left to right and right toright collaterals.  There is severe left ventricular systolic dysfunction.  The left  ventricular ejection fraction is less than 25% by visual estimate.  LV end diastolic pressure is moderately elevated.  Ao sat 96%, PA sat 63%, PA pressure mean 25 mm Hg; PCWP mean 23 mm Hg, CO 4.8 L/min; CI 2.6 Severe multivessel disease. Plan cardiac surgery consult.  Given low EF, will also obtain CHF consult. Patient will need diuresis. D/w Dr. Aundra Dubin.  Normal mean PA pressure.   Indications  Coronary artery disease involving native coronary artery of native heart with other form of angina pectoris ([place]) [  I25.118 (FIE-33-IR)]  Acute systolic heart failure ([place]) [I50.21 (ICD-10-CM)]  Procedural Details  Technical Details The risks, benefits, and details of the procedure were explained to the patient. The patient verbalized understanding and wanted to proceed. Informed written consent was obtained.  PROCEDURE TECHNIQUE: After Xylocaine anesthesia a 67F slender sheath was placed in the right radial artery with a single anterior needle wall stick. IV Heparin was given. Right coronary angiography was done using a Judkins R4 guide catheter. Left ventriculography was done using a JR4 catheter. Left coronary angiography was done using a Judkins L3.5 guide catheter. A TR band was used for hemostasis.  Based on low EF and severe CAD, right heart cath was added on. U/s guidance for access in the right antecubital area. An image was captured and stored. 5 Fr Swan catheter advance. Glide wire required. Pressure measurement and O2 sats checked.  Contrast: 50 cc  Estimated blood loss <50 mL.   During this procedure medications were administered to achieve and maintain moderate conscious sedation while the patient's heart rate, blood pressure, and oxygen saturation were continuously monitored and I was present face-to-face 100% of this time.  Medications  (Filter: Administrations occurring from 02/08/18 1243 to 02/08/18 1407)          Medication Rate/Dose/Volume Action  Date Time   Heparin (Porcine) in  NaCl 1000-0.9 UT/500ML-% SOLN (mL) 500 mL Given 02/08/18 1247   Total dose as of 02/09/18 2025        500 mL        Heparin (Porcine) in NaCl 1000-0.9 UT/500ML-% SOLN (mL) 500 mL Given 02/08/18 1247   Total dose as of 02/09/18 2025        500 mL        fentaNYL (SUBLIMAZE) injection (mcg) 25 mcg Given 02/08/18 1300   Total dose as of 02/09/18 2025        25 mcg        midazolam (VERSED) injection (mg) 1 mg Given 02/08/18 1300   Total dose as of 02/09/18 2025        1 mg        lidocaine (PF) (XYLOCAINE) 1 % injection (mL) 2 mL Given 02/08/18 1305   Total dose as of 02/09/18 2025 3 mL Given 1323   5 mL        Radial Cocktail/Verapamil only (mL) 10 mL Given 02/08/18 1305   Total dose as of 02/09/18 2025        10 mL        heparin injection (Units) 3,500 Units Given 02/08/18 1309   Total dose as of 02/09/18 2025        3,500 Units        iohexol (OMNIPAQUE) 350 MG/ML injection (mL) 50 mL Given 02/08/18 1348   Total dose as of 02/09/18 2025        50 mL        Sedation Time  Sedation Time Physician-1: 41 minutes 26 seconds  Coronary Findings  Diagnostic  Dominance: Right  Left Main  Mid LM to Dist LM lesion 60% stenosed  Mid LM to Dist LM lesion is 60% stenosed. The lesion is eccentric.  Left Anterior Descending  Ost LAD lesion 70% stenosed  Ost LAD lesion is 70% stenosed.  Mid LAD lesion 90% stenosed  Mid LAD lesion is 90% stenosed. The lesion is ulcerative.  Left Circumflex  Ost Cx to Prox Cx lesion 50% stenosed  Ost Cx to Prox Cx lesion is  50% stenosed.  Mid Cx lesion 25% stenosed  Mid Cx lesion is 25% stenosed.  Right Coronary Artery  Collaterals  Dist RCA filled by collaterals from Prox RCA.    Mid RCA lesion 100% stenosed  Mid RCA lesion is 100% stenosed.  Right Posterior Descending Artery  Collaterals  RPDA filled by collaterals from 1st Sept.    Intervention  No interventions have been documented.  Right Heart  Right Heart Pressures Ao sat 96%, PA sat 63%,  PA pressure mean 25 mm Hg; PCWP mean 23 mm Hg, CO 4.8 L/min; CI 2.6  Wall Motion       Resting       All segments of the heart are hypokinetic.      Left Heart  Left Ventricle The left ventricle is moderately dilated. There is severe left ventricular systolic dysfunction. LV end diastolic pressure is moderately elevated. The left ventricular ejection fraction is less than 25% by visual estimate. There are LV function abnormalities due to global hypokinesis.  Coronary Diagrams  Diagnostic  Dominance: Right   Intervention  Implants     No implant documentation for this case.  MERGE Images  Link to Procedure Log   Show images for CARDIAC CATHETERIZATION Procedure Log  Hemo Data   Most Recent Value  Fick Cardiac Output 4.83 L/min  Fick Cardiac Output Index 2.6 (L/min)/BSA  RA A Wave 7 mmHg  RA V Wave 4 mmHg  RA Mean 4 mmHg  RV Systolic Pressure 41 mmHg  RV Diastolic Pressure 6 mmHg  RV EDP 7 mmHg  PA Systolic Pressure 37 mmHg  PA Diastolic Pressure 16 mmHg  PA Mean 24 mmHg  PW A Wave 28 mmHg  PW V Wave 29 mmHg  PW Mean 23 mmHg  AO Systolic Pressure 353 mmHg  AO Diastolic Pressure 60 mmHg  AO Mean 80 mmHg  LV Systolic Pressure 299 mmHg  LV Diastolic Pressure 10 mmHg  LV EDP 27 mmHg  AOp Systolic Pressure 242 mmHg  AOp Diastolic Pressure 55 mmHg  AOp Mean Pressure 75 mmHg  LVp Systolic Pressure 683 mmHg  LVp Diastolic Pressure 11 mmHg  LVp EDP Pressure 20 mmHg  QP/QS 1  TPVR Index 9.61 HRUI  TSVR Index 30.77 HRUI  PVR SVR Ratio 0.03  TPVR/TSVR Ratio 0.31  *[place]*  *[facility]*  [address], [place] [address]  [phone]  -------------------------------------------------------------------  Transthoracic Echocardiography  Patient: Luis Chen, Luis Chen  MR #: [id number]  Study Date: 02/09/2018  Gender: M  Age: 3  Height: 177.8 cm  Weight: 64.9 kg  BSA: 1.78 m^2  Pt. Status:  Room: 2W30C  ADMITTING Loralie Champagne, M.D.    ATTENDING Varanasi, [place], Inpatient  SONOGRAPHER Dance, [place], [place], Ashley M  cc:  -------------------------------------------------------------------  LV EF: 20% - 25%  -------------------------------------------------------------------  Indications: CHF - 428.0.  -------------------------------------------------------------------  History: PMH: Coronary artery disease. Risk factors:  Hypertension. Dyslipidemia.  -------------------------------------------------------------------  Study Conclusions  - Left ventricle: The cavity size was mildly dilated. Systolic  function was severely reduced. The estimated ejection fraction  was in the range of 20% to 25%. Severe diffuse hypokinesis.  - Aortic valve: There was mild regurgitation.  - Mitral valve: There was no significant regurgitation.  - Left atrium: The atrium was mildly to moderately dilated.  - Right ventricle: Systolic function was normal.  - Right atrium: The atrium was mildly to moderately dilated.  -  Tricuspid valve: There was trivial regurgitation.  - Pulmonic valve: There was mild regurgitation.  Impressions:  - Severe diffuse hypokinesis with reduced EF, changed from prior.  Consistent with severe multivessel CAD.  -------------------------------------------------------------------  Study data: Study status: Routine. Procedure: Transthoracic  echocardiography. Image quality was adequate. Study completion:  There were no complications. Transthoracic  echocardiography. M-mode, complete 2D, spectral Doppler, and color  Doppler. Birthdate: Patient birthdate: [birth year]. Age: Patient  is 77 yr old. Sex: Gender: male. BMI: 20.5 kg/m^2. Blood  pressure: 96/75 Patient status: Inpatient. Study date:  Study date: 02/09/2018. Study time: 03:06 PM. Location: Bedside.  -------------------------------------------------------------------   -------------------------------------------------------------------  Left ventricle: The cavity size was mildly dilated. Systolic  function was severely reduced. The estimated ejection fraction was  in the range of 20% to 25%. Regional wall motion abnormalities:  Severe diffuse hypokinesis.  -------------------------------------------------------------------  Aortic valve: Trileaflet; mildly thickened, mildly calcified  leaflets. Mobility was not restricted. Doppler: Transvalvular  velocity was within the normal range. There was no stenosis. There  was mild regurgitation.  -------------------------------------------------------------------  Aorta: Aortic root: The aortic root was normal in size.  -------------------------------------------------------------------  Mitral valve: Structurally normal valve. Mobility was not  restricted. Doppler: Transvalvular velocity was within the normal  range. There was no evidence for stenosis. There was no significant  regurgitation.  -------------------------------------------------------------------  Left atrium: The atrium was mildly to moderately dilated.  -------------------------------------------------------------------  Right ventricle: The cavity size was normal. Wall thickness was  normal. Systolic function was normal.  -------------------------------------------------------------------  Pulmonic valve: Poorly visualized. The valve appears to be  grossly normal. Doppler: Transvalvular velocity was within the  normal range. There was no evidence for stenosis. There was mild  regurgitation.  -------------------------------------------------------------------  Tricuspid valve: Structurally normal valve. Doppler:  Transvalvular velocity was within the normal range. There was  trivial regurgitation.  -------------------------------------------------------------------  Pulmonary artery: The main pulmonary artery was normal-sized.  Systolic  pressure was within the normal range.  -------------------------------------------------------------------  Right atrium: The atrium was mildly to moderately dilated.  -------------------------------------------------------------------  Pericardium: There was no pericardial effusion.  -------------------------------------------------------------------  Systemic veins:  Inferior vena cava: The vessel was normal in size.  -------------------------------------------------------------------  Measurements  Left ventricle Value Reference  LV ID, ED, PLAX chordal 50.5 mm 43 - 52  LV ID, ES, PLAX chordal (H) 46.4 mm 23 - 38  LV fx shortening, PLAX chordal (L) 8 % >=29  LV PW thickness, ED 10.2 mm ----------  IVS/LV PW ratio, ED 0.97 <=1.3  Stroke volume, 2D 33 ml ----------  Stroke volume/bsa, 2D 18 ml/m^2 ----------  LV e&', lateral 11.5 cm/s ----------  LV E/e&', lateral 4.22 ----------  LV e&', medial 7.94 cm/s ----------  LV E/e&', medial 6.11 ----------  LV e&', average 9.72 cm/s ----------  LV E/e&', average 4.99 ----------  Ventricular septum Value Reference  IVS thickness, ED 9.92 mm ----------  LVOT Value Reference  LVOT ID, S 23 mm ----------  LVOT area 4.15 cm^2 ----------  LVOT peak velocity, S 46.6 cm/s ----------  LVOT mean velocity, S 30.6 cm/s ----------  LVOT VTI, S 7.96 cm ----------  Aortic valve Value Reference  Aortic regurg pressure half-time 620 ms ----------  Aorta Value Reference  Aortic root ID, ED 40 mm ----------  Ascending aorta ID, A-P, S 35 mm ----------  Left atrium Value Reference  LA ID, A-P, ES 33 mm ----------  LA ID/bsa, A-P 1.85 cm/m^2 <=2.2  LA volume, S 53 ml ----------  LA volume/bsa, S 29.7 ml/m^2 ----------  LA volume, ES, 1-p A4C 60.7 ml ----------  LA volume/bsa, ES, 1-p A4C 34 ml/m^2 ----------  LA volume, ES, 1-p A2C 45.1 ml ----------  LA volume/bsa, ES, 1-p A2C 25.3 ml/m^2 ----------  Mitral valve Value Reference  Mitral E-wave  peak velocity 48.5 cm/s ----------  Mitral A-wave peak velocity 90.7 cm/s ----------  Mitral deceleration time 204 ms 150 - 230  Mitral E/A ratio, peak 0.5 ----------  Right atrium Value Reference  RA ID, S-I, ES, A4C (H) 53.5 mm 34 - 49  RA area, ES, A4C 18.6 cm^2 8.3 - 19.5  RA volume, ES, A/L 53.4 ml ----------  RA volume/bsa, ES, A/L 29.9 ml/m^2 ----------  Right ventricle Value Reference  RV ID, minor axis, ED, A4C base 28.2 mm ----------  TAPSE 18.4 mm ----------  RV s&', lateral, S 7.4 cm/s ----------  Pulmonic valve Value Reference  Pulmonic regurg velocity, ED 88.2 cm/s ----------  Legend:  (L) and (H) mark values outside specified reference range.  -------------------------------------------------------------------  Prepared and Electronically Authenticated by  Buford Dresser  2019-12-04T18:11:17    Assessment/Plan:  This 77 year old gentleman presents with a one-month history of epigastric and lower chest discomfort that may be an anginal equivalent with no symptoms of congestive heart failure despite having severe left ventricular systolic dysfunction with ejection fraction of 16% by nuclear stress and 20 to 25% by echocardiogram today. Cardiac catheterization shows severe three-vessel coronary disease with a 90% mid LAD stenosis and occluded right coronary artery with left-to-right collaterals filling a small distal vessel. Despite his low ejection fraction his cardiac index is good and pulmonary artery pressures are only mildly elevated. Left ventricular end-diastolic pressure was also mildly elevated. His symptoms do not necessarily match his degree of left ventricular systolic dysfunction. His ejection fraction in August 2019 was 50 to 55% by echocardiogram. He does have a history of prior nonischemic cardiomyopathy in 2010 when his ejection fraction was noted to be 25% which subsequently improved to 50% a year later. He had no significant coronary disease at that  time. It is certainly possible that is present for a left ventricular systolic function is due to a combination of nonischemic and ischemic cardiomyopathy. His degree of global left ventricular systolic dysfunction appears out of proportion to his degree of coronary disease and even his lateral wall appears severely hypokinetic despite moderate disease in the left circumflex territory.  His LAD looks like a good graftable vessel as does his left circumflex branches. His right coronary is probably graftable although this is a relatively small distal vessel.  His cardiac MRI is suggested there is a significant amount of viable myocardium and coronary revascularization may help him.  I reviewed the study with Dr. Aundra Dubin and we feel that proceeding with coronary bypass graft surgery is the best treatment. I discussed the operative procedure with the patient and and his daughter including alternatives, benefits and risks; including but not limited to bleeding, blood transfusion, infection, stroke, myocardial infarction, graft failure, heart block requiring a permanent pacemaker, organ dysfunction, and death.  Luis Chen understands and agrees to proceed.  We will plan to perform coronary bypass graft surgery.   [name]

## 2018-02-16 NOTE — Progress Notes (Addendum)
PCP: Billey Chang, MD  Cardiologist: [name], MD  EKG: 02/08/18 in EPIC  Stress test: 11/10/17 in EPIC  ECHO: 02/09/18 in EPIC  Cardiac Cath: 02/08/18 in EPIC  Chest x-ray: 12/11/`19 at PAT appt  Patient is aware to report for pulmonary function testing at 1300.

## 2018-02-16 NOTE — Progress Notes (Addendum)
Anesthesia Chart Review:  Case:  [id number] Date/Time:  02/17/18 0715   Procedures:      CORONARY ARTERY BYPASS GRAFTING (CABG) (N/A Chest)     TRANSESOPHAGEAL ECHOCARDIOGRAM (TEE) (N/A )   Anesthesia type:  General   Pre-op diagnosis:  CAD   Location:  [facility] OR ROOM 14 / [place] [facility]   Surgeon:  [name], MD      DISCUSSION: Patient is a 77 year old male scheduled for the above procedure.   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 Post-operative echo in 3 months planned to determine if he will eventually need an ICD.  Preoperative CXR results are still pending. Based on currently available information I would anticipate that he can proceed as planned if no acute changes.   VS: BP 118/69   Pulse 80   Temp (!) 36.3 C   Resp 18   Ht 5\' 10"  (1.778 m)   Wt 66 kg   SpO2 100%   BMI 20.88 kg/m     PROVIDERS: Leamon Arnt, MD is PCP [name], MD is cardiologist.   LABS: Labs reviewed: Acceptable for surgery. (all labs ordered are listed, but only abnormal results are displayed)  Labs Reviewed  BLOOD GAS, ARTERIAL - Abnormal; Notable for the following components:      Result Value   Acid-base deficit 3.6 (*)    Allens test (pass/fail) RIGHT BRACHIAL (*)    All other components within normal limits  CBC - Abnormal; Notable for the following components:   RBC 4.13 (*)    Hemoglobin 12.8 (*)    All other components within normal limits  COMPREHENSIVE METABOLIC PANEL - Abnormal; Notable for the following components:   CO2 18 (*)    Glucose, Bld 102 (*)    BUN 33 (*)    Creatinine, Ser 1.49 (*)    GFR calc non Af Amer 44 (*)    GFR calc Af Amer 51 (*)    All other components within normal limits  HEMOGLOBIN A1C - Abnormal; Notable for the following components:   Hgb A1c MFr Bld 5.7 (*)    All other components within normal limits  SURGICAL PCR SCREEN  APTT  PROTIME-INR  URINALYSIS,  ROUTINE W REFLEX MICROSCOPIC  TYPE AND SCREEN   Lab Results  Component Value Date   CREATININE 1.49 (H) 02/16/2018   CREATININE 1.86 (H) 02/11/2018   CREATININE 1.79 (H) 02/10/2018    PFTs 02/16/18: FVC 2.52 (68%), FEV1 1.90 (70%), DLCO unc 15.97 (49%).  IMAGES: CXR 02/16/18:  IMPRESSION: Cardiomegaly.  No active disease.   EKG: 02/08/18: Normal sinus rhythm. Possible left atrial enlargement. Left axis deviation.  LVH.  Inferior infarct, age undetermined.  Anteroseptal infarct, age undetermined.  T wave abnormality, consider lateral ischemia.   CV: MR Cardiac morphology 02/10/18: IMPRESSION: 1. Moderately dilated LV with EF 22%, diffuse hypokinesis with regional variation. 2.  Normal RV size with mildly decreased systolic function, EF 21%. 3. Nonspecific LGE at the anterior RV insertion site, this can be associated with LV volume/pressure overload. Possible coronary pattern LGE at the true apex but subtle and obscured by artifact. There is not significant coronary pattern scarring present on this scan.  Echo 02/09/18: Study Conclusions - Left ventricle: The cavity size was mildly dilated. Systolic   function was severely reduced. The estimated ejection fraction   was in the range of 20% to 25%. Severe diffuse hypokinesis. - Aortic  valve: There was mild regurgitation. - Mitral valve: There was no significant regurgitation. - Left atrium: The atrium was mildly to moderately dilated. - Right ventricle: Systolic function was normal. - Right atrium: The atrium was mildly to moderately dilated. - Tricuspid valve: There was trivial regurgitation. - Pulmonic valve: There was mild regurgitation. Impressions: - Severe diffuse hypokinesis with reduced EF, changed from prior.   Consistent with severe multivessel CAD.  RHC/LHC 02/08/18:  Mid LAD lesion is 90% stenosed.  Mid LM to Dist LM lesion is 60% stenosed.  Ost LAD lesion is 70% stenosed.  Ost Cx to Prox Cx lesion is  50% stenosed.  Mid Cx lesion is 25% stenosed.  Mid RCA lesion is 100% stenosed. Distal vessel fills by brisk left to right and right toright collaterals.  There is severe left ventricular systolic dysfunction.  The left ventricular ejection fraction is less than 25% by visual estimate.  LV end diastolic pressure is moderately elevated.  Ao sat 96%, PA sat 63%, PA pressure mean 25 mm Hg; PCWP mean 23 mm Hg, CO 4.8 L/min; CI 2.6 Severe multivessel disease.  Plan cardiac surgery consult.   Given low EF, will also obtain CHF consult.  Patient will need diuresis.  D/w Dr. Aundra Dubin. Normal mean PA pressure.   Carotid U/S 02/11/18: Summary: Right Carotid: Velocities in the right ICA are consistent with a 1-39% stenosis. Left Carotid: There is no evidence of stenosis in the left ICA. Vertebrals: Bilateral vertebral arteries demonstrate antegrade flow. Subclavians: Normal flow hemodynamics were seen in bilateral subclavian arteries.   Past Medical History:  Diagnosis Date  . CAD (coronary artery disease)    non obstructive. Left main normal. LAd proximal long 25% stenosis, termingating as focal 50% prox to mid lesion. First & second diag were small, normal. Circumflex in proximal av groove had luminal irregularities.. Was large mid obtuse marg which was branching, scattered luminal irregul. Infer branch did supply some septal perforators. Was PDA,small & normal.  < 1.43mm. There was prox 95% stenosis  . Cardiomyopathy    EF was 25% improved to 50%.   . CHF (congestive heart failure) ([place])   . CKD (chronic kidney disease)   . Hyperlipidemia    transient history, controlled on diet  . Hypertension     Past Surgical History:  Procedure Laterality Date  . KNEE ARTHROSCOPY Left 07/01/2015   Procedure: Irrigation and debridement left knee arthroscopy;  Surgeon: Renette Butters, MD;  Location: [place];  Service: Orthopedics;  Laterality: Left;  . RIGHT/LEFT HEART CATH AND CORONARY ANGIOGRAPHY  N/A 02/08/2018   Procedure: RIGHT/LEFT HEART CATH AND CORONARY ANGIOGRAPHY;  Surgeon: Jettie Booze, MD;  Location: [facility];  Service: Cardiovascular;  Laterality: N/A;    MEDICATIONS: . aspirin 81 MG tablet  . carvedilol (COREG) 3.125 MG tablet  . feeding supplement, ENSURE ENLIVE, (ENSURE ENLIVE) LIQD  . Ferrous Gluconate 324 (37.5 Fe) MG TABS  . furosemide (LASIX) 40 MG tablet  . latanoprost (XALATAN) 0.005 % ophthalmic solution  . losartan (COZAAR) 25 MG tablet  . rosuvastatin (CRESTOR) 40 MG tablet  . spironolactone (ALDACTONE) 25 MG tablet  . traZODone (DESYREL) 50 MG tablet  . trolamine salicylate (ASPERCREME) 10 % cream   No current facility-administered medications for this encounter.    Marland Kitchen albuterol (PROVENTIL) (2.5 MG/3ML) 0.083% nebulizer solution 2.5 mg  . [START ON 02/17/2018] cefUROXime (ZINACEF) 1.5 g in sodium chloride 0.9 % 100 mL IVPB  . [START ON 02/17/2018] cefUROXime (ZINACEF)  750 mg in sodium chloride 0.9 % 100 mL IVPB  . [START ON 02/17/2018] dexmedetomidine (PRECEDEX) 400 MCG/100ML (4 mcg/mL) infusion  . [START ON 02/17/2018] DOPamine (INTROPIN) 800 mg in dextrose 5 % 250 mL (3.2 mg/mL) infusion  . [START ON 02/17/2018] EPINEPHrine (ADRENALIN) 4 mg in dextrose 5 % 250 mL (0.016 mg/mL) infusion  . [START ON 02/17/2018] heparin 2,500 Units, papaverine 30 mg in electrolyte-148 (PLASMALYTE-148) 500 mL irrigation  . [START ON 02/17/2018] heparin 30,000 units/NS 1000 mL solution for CELLSAVER  . [START ON 02/17/2018] insulin regular, human (MYXREDLIN) 100 units/ 100 mL infusion  . [START ON 02/17/2018] magnesium sulfate (IV Push/IM) injection 40 mEq  . [START ON 02/17/2018] milrinone (PRIMACOR) 20 MG/100 ML (0.2 mg/mL) infusion  . [START ON 02/17/2018] nitroGLYCERIN 50 mg in dextrose 5 % 250 mL (0.2 mg/mL) infusion  . [START ON 02/17/2018] phenylephrine (NEOSYNEPHRINE) 20-0.9 MG/250ML-% infusion  . [START ON 02/17/2018] potassium chloride injection  80 mEq  . [START ON 02/17/2018] tranexamic acid (CYKLOKAPRON) 2,500 mg in sodium chloride 0.9 % 250 mL (10 mg/mL) infusion  . [START ON 02/17/2018] tranexamic acid (CYKLOKAPRON) bolus via infusion - over 30 minutes 954 mg  . [START ON 02/17/2018] tranexamic acid (CYKLOKAPRON) pump prime solution 127 mg  . [START ON 02/17/2018] vancomycin (VANCOCIN) 1,250 mg in sodium chloride 0.9 % 250 mL IVPB    George Hugh [facility] Short Stay Center/Anesthesiology Phone [phone] 02/16/2018 4:47 PM

## 2018-02-16 NOTE — Anesthesia Preprocedure Evaluation (Addendum)
Anesthesia Evaluation  Patient identified by MRN, date of birth, ID band Patient awake    Reviewed: Allergy & Precautions, NPO status , Patient's Chart, lab work & pertinent test results  Airway Mallampati: II  TM Distance: >3 FB     Dental  (+) Poor Dentition, Loose, Missing, Dental Advisory Given,    Pulmonary    breath sounds clear to auscultation       Cardiovascular hypertension, + angina + CAD and +CHF   Rhythm:Regular Rate:Normal     Neuro/Psych    GI/Hepatic   Endo/Other    Renal/GU Renal disease     Musculoskeletal  (+) Arthritis ,   Abdominal   Peds  Hematology  (+) anemia ,   Anesthesia Other Findings   Reproductive/Obstetrics                           Anesthesia Physical Anesthesia Plan  ASA: IV  Anesthesia Plan: General   Post-op Pain Management:    Induction: Intravenous  PONV Risk Score and Plan: 2 and Ondansetron, Dexamethasone and Midazolam  Airway Management Planned: Oral ETT  Additional Equipment: Arterial line and PA Cath  Intra-op Plan:   Post-operative Plan: Post-operative intubation/ventilation  Informed Consent: I have reviewed the patients History and Physical, chart, labs and discussed the procedure including the risks, benefits and alternatives for the proposed anesthesia with the patient or authorized representative who has indicated his/her understanding and acceptance.   Dental advisory given  Plan Discussed with: CRNA and Anesthesiologist  Anesthesia Plan Comments: (PAT note written 02/16/2018 by Myra Gianotti, PA-C. )      Anesthesia Quick Evaluation

## 2018-02-17 ENCOUNTER — Inpatient Hospital Stay (HOSPITAL_COMMUNITY): Payer: Medicare Other | Admitting: Vascular Surgery

## 2018-02-17 ENCOUNTER — Inpatient Hospital Stay (HOSPITAL_COMMUNITY)
Admission: RE | Admit: 2018-02-17 | Discharge: 2018-02-22 | DRG: 235 | Disposition: A | Discharge status: Home / Self Care | Payer: Medicare Other | Attending: Surgery | Admitting: Surgery

## 2018-02-17 ENCOUNTER — Inpatient Hospital Stay (HOSPITAL_COMMUNITY): Payer: Medicare Other | Admitting: Certified Registered Nurse Anesthetist

## 2018-02-17 ENCOUNTER — Inpatient Hospital Stay (HOSPITAL_COMMUNITY): Payer: Medicare Other

## 2018-02-17 ENCOUNTER — Inpatient Hospital Stay (HOSPITAL_COMMUNITY)
Admission: RE | Disposition: A | Discharge status: Home / Self Care | Payer: Self-pay | Source: Home / Self Care | Attending: Surgery

## 2018-02-17 ENCOUNTER — Encounter (HOSPITAL_COMMUNITY): Payer: Self-pay | Admitting: *Deleted

## 2018-02-17 DIAGNOSIS — Z6820 Body mass index (BMI) 20.0-20.9, adult: Secondary | ICD-10-CM

## 2018-02-17 DIAGNOSIS — E785 Hyperlipidemia, unspecified: Secondary | ICD-10-CM | POA: Diagnosis present

## 2018-02-17 DIAGNOSIS — D62 Acute posthemorrhagic anemia: Secondary | ICD-10-CM | POA: Diagnosis not present

## 2018-02-17 DIAGNOSIS — I2510 Atherosclerotic heart disease of native coronary artery without angina pectoris: Secondary | ICD-10-CM

## 2018-02-17 DIAGNOSIS — I252 Old myocardial infarction: Secondary | ICD-10-CM | POA: Diagnosis not present

## 2018-02-17 DIAGNOSIS — Z79899 Other long term (current) drug therapy: Secondary | ICD-10-CM | POA: Diagnosis not present

## 2018-02-17 DIAGNOSIS — N183 Chronic kidney disease, stage 3 (moderate): Secondary | ICD-10-CM | POA: Diagnosis present

## 2018-02-17 DIAGNOSIS — J90 Pleural effusion, not elsewhere classified: Secondary | ICD-10-CM | POA: Diagnosis not present

## 2018-02-17 DIAGNOSIS — I428 Other cardiomyopathies: Secondary | ICD-10-CM | POA: Diagnosis present

## 2018-02-17 DIAGNOSIS — I130 Hypertensive heart and chronic kidney disease with heart failure and stage 1 through stage 4 chronic kidney disease, or unspecified chronic kidney disease: Secondary | ICD-10-CM | POA: Diagnosis present

## 2018-02-17 DIAGNOSIS — E1122 Type 2 diabetes mellitus with diabetic chronic kidney disease: Secondary | ICD-10-CM | POA: Diagnosis present

## 2018-02-17 DIAGNOSIS — J9811 Atelectasis: Secondary | ICD-10-CM

## 2018-02-17 DIAGNOSIS — N170 Acute kidney failure with tubular necrosis: Secondary | ICD-10-CM | POA: Diagnosis not present

## 2018-02-17 DIAGNOSIS — I440 Atrioventricular block, first degree: Secondary | ICD-10-CM | POA: Diagnosis not present

## 2018-02-17 DIAGNOSIS — E440 Moderate protein-calorie malnutrition: Secondary | ICD-10-CM

## 2018-02-17 DIAGNOSIS — Z8249 Family history of ischemic heart disease and other diseases of the circulatory system: Secondary | ICD-10-CM | POA: Diagnosis not present

## 2018-02-17 DIAGNOSIS — I5020 Unspecified systolic (congestive) heart failure: Secondary | ICD-10-CM | POA: Diagnosis not present

## 2018-02-17 DIAGNOSIS — I5043 Acute on chronic combined systolic (congestive) and diastolic (congestive) heart failure: Secondary | ICD-10-CM | POA: Diagnosis present

## 2018-02-17 DIAGNOSIS — I083 Combined rheumatic disorders of mitral, aortic and tricuspid valves: Secondary | ICD-10-CM | POA: Diagnosis not present

## 2018-02-17 DIAGNOSIS — J939 Pneumothorax, unspecified: Secondary | ICD-10-CM | POA: Diagnosis not present

## 2018-02-17 DIAGNOSIS — I110 Hypertensive heart disease with heart failure: Secondary | ICD-10-CM | POA: Diagnosis not present

## 2018-02-17 DIAGNOSIS — D6959 Other secondary thrombocytopenia: Secondary | ICD-10-CM | POA: Diagnosis not present

## 2018-02-17 DIAGNOSIS — I25119 Atherosclerotic heart disease of native coronary artery with unspecified angina pectoris: Principal | ICD-10-CM | POA: Diagnosis present

## 2018-02-17 DIAGNOSIS — Z888 Allergy status to other drugs, medicaments and biological substances status: Secondary | ICD-10-CM | POA: Diagnosis not present

## 2018-02-17 DIAGNOSIS — Z8349 Family history of other endocrine, nutritional and metabolic diseases: Secondary | ICD-10-CM | POA: Diagnosis not present

## 2018-02-17 DIAGNOSIS — I255 Ischemic cardiomyopathy: Secondary | ICD-10-CM | POA: Diagnosis present

## 2018-02-17 DIAGNOSIS — Z951 Presence of aortocoronary bypass graft: Secondary | ICD-10-CM | POA: Diagnosis not present

## 2018-02-17 DIAGNOSIS — Z7982 Long term (current) use of aspirin: Secondary | ICD-10-CM | POA: Diagnosis not present

## 2018-02-17 HISTORY — PX: CORONARY ARTERY BYPASS GRAFT: SHX141

## 2018-02-17 HISTORY — PX: TEE WITHOUT CARDIOVERSION: SHX5443

## 2018-02-17 LAB — POCT I-STAT, CHEM 8
BUN: 25 mg/dL — ABNORMAL HIGH (ref 8–23)
BUN: 19 mg/dL (ref 8–23)
BUN: 24 mg/dL — ABNORMAL HIGH (ref 8–23)
BUN: 22 mg/dL (ref 8–23)
BUN: 21 mg/dL (ref 8–23)
BUN: 19 mg/dL (ref 8–23)
CHLORIDE: 108 mmol/L (ref 98–111)
CREATININE: 1.4 mg/dL — AB (ref 0.61–1.24)
Calcium, Ion: 1.2 mmol/L (ref 1.15–1.40)
Calcium, Ion: 0.99 mmol/L — ABNORMAL LOW (ref 1.15–1.40)
Calcium, Ion: 1.26 mmol/L (ref 1.15–1.40)
Calcium, Ion: 1.14 mmol/L — ABNORMAL LOW (ref 1.15–1.40)
Calcium, Ion: 1.15 mmol/L (ref 1.15–1.40)
Calcium, Ion: 1.23 mmol/L (ref 1.15–1.40)
Chloride: 105 mmol/L (ref 98–111)
Chloride: 110 mmol/L (ref 98–111)
Chloride: 108 mmol/L (ref 98–111)
Chloride: 106 mmol/L (ref 98–111)
Chloride: 111 mmol/L (ref 98–111)
Creatinine, Ser: 1 mg/dL (ref 0.61–1.24)
Creatinine, Ser: 1.2 mg/dL (ref 0.61–1.24)
Creatinine, Ser: 1.2 mg/dL (ref 0.61–1.24)
Creatinine, Ser: 1.2 mg/dL (ref 0.61–1.24)
Creatinine, Ser: 1.3 mg/dL — ABNORMAL HIGH (ref 0.61–1.24)
Glucose, Bld: 100 mg/dL — ABNORMAL HIGH (ref 70–99)
Glucose, Bld: 73 mg/dL (ref 70–99)
Glucose, Bld: 88 mg/dL (ref 70–99)
Glucose, Bld: 107 mg/dL — ABNORMAL HIGH (ref 70–99)
Glucose, Bld: 115 mg/dL — ABNORMAL HIGH (ref 70–99)
Glucose, Bld: 118 mg/dL — ABNORMAL HIGH (ref 70–99)
HCT: 34 % — ABNORMAL LOW (ref 39.0–52.0)
HCT: 26 % — ABNORMAL LOW (ref 39.0–52.0)
HCT: 30 % — ABNORMAL LOW (ref 39.0–52.0)
HCT: 26 % — ABNORMAL LOW (ref 39.0–52.0)
HCT: 26 % — ABNORMAL LOW (ref 39.0–52.0)
HCT: 29 % — ABNORMAL LOW (ref 39.0–52.0)
Hemoglobin: 11.6 g/dL — ABNORMAL LOW (ref 13.0–17.0)
Hemoglobin: 10.2 g/dL — ABNORMAL LOW (ref 13.0–17.0)
Hemoglobin: 8.8 g/dL — ABNORMAL LOW (ref 13.0–17.0)
Hemoglobin: 8.8 g/dL — ABNORMAL LOW (ref 13.0–17.0)
Hemoglobin: 8.8 g/dL — ABNORMAL LOW (ref 13.0–17.0)
Hemoglobin: 9.9 g/dL — ABNORMAL LOW (ref 13.0–17.0)
POTASSIUM: 4.5 mmol/L (ref 3.5–5.1)
POTASSIUM: 5.9 mmol/L — AB (ref 3.5–5.1)
Potassium: 4.4 mmol/L (ref 3.5–5.1)
Potassium: 4.5 mmol/L (ref 3.5–5.1)
Potassium: 5 mmol/L (ref 3.5–5.1)
Potassium: 4.7 mmol/L (ref 3.5–5.1)
SODIUM: 139 mmol/L (ref 135–145)
Sodium: 138 mmol/L (ref 135–145)
Sodium: 141 mmol/L (ref 135–145)
Sodium: 136 mmol/L (ref 135–145)
Sodium: 137 mmol/L (ref 135–145)
Sodium: 140 mmol/L (ref 135–145)
TCO2: 26 mmol/L (ref 22–32)
TCO2: 26 mmol/L (ref 22–32)
TCO2: 27 mmol/L (ref 22–32)
TCO2: 26 mmol/L (ref 22–32)
TCO2: 25 mmol/L (ref 22–32)
TCO2: 22 mmol/L (ref 22–32)

## 2018-02-17 LAB — POCT I-STAT 3, ART BLOOD GAS (G3+)
ACID-BASE DEFICIT: 6 mmol/L — AB (ref 0.0–2.0)
Acid-base deficit: 2 mmol/L (ref 0.0–2.0)
Acid-base deficit: 1 mmol/L (ref 0.0–2.0)
Acid-base deficit: 4 mmol/L — ABNORMAL HIGH (ref 0.0–2.0)
BICARBONATE: 20.6 mmol/L (ref 20.0–28.0)
Bicarbonate: 24.2 mmol/L (ref 20.0–28.0)
Bicarbonate: 23.2 mmol/L (ref 20.0–28.0)
Bicarbonate: 23.6 mmol/L (ref 20.0–28.0)
Bicarbonate: 20.8 mmol/L (ref 20.0–28.0)
O2 Saturation: 100 %
O2 Saturation: 100 %
O2 Saturation: 100 %
O2 Saturation: 99 %
O2 Saturation: 98 %
PCO2 ART: 36 mmHg (ref 32.0–48.0)
PH ART: 7.424 (ref 7.350–7.450)
Patient temperature: 35.6
Patient temperature: 37
TCO2: 25 mmol/L (ref 22–32)
TCO2: 24 mmol/L (ref 22–32)
TCO2: 25 mmol/L (ref 22–32)
TCO2: 22 mmol/L (ref 22–32)
TCO2: 22 mmol/L (ref 22–32)
pCO2 arterial: 43.8 mmHg (ref 32.0–48.0)
pCO2 arterial: 30.4 mmHg — ABNORMAL LOW (ref 32.0–48.0)
pCO2 arterial: 34.2 mmHg (ref 32.0–48.0)
pCO2 arterial: 43.8 mmHg (ref 32.0–48.0)
pH, Arterial: 7.35 (ref 7.350–7.450)
pH, Arterial: 7.49 — ABNORMAL HIGH (ref 7.350–7.450)
pH, Arterial: 7.386 (ref 7.350–7.450)
pH, Arterial: 7.28 — ABNORMAL LOW (ref 7.350–7.450)
pO2, Arterial: 492 mmHg — ABNORMAL HIGH (ref 83.0–108.0)
pO2, Arterial: 465 mmHg — ABNORMAL HIGH (ref 83.0–108.0)
pO2, Arterial: 374 mmHg — ABNORMAL HIGH (ref 83.0–108.0)
pO2, Arterial: 143 mmHg — ABNORMAL HIGH (ref 83.0–108.0)
pO2, Arterial: 124 mmHg — ABNORMAL HIGH (ref 83.0–108.0)

## 2018-02-17 LAB — APTT: aPTT: 36 seconds (ref 24–36)

## 2018-02-17 LAB — CREATININE, SERUM
Creatinine, Ser: 1.42 mg/dL — ABNORMAL HIGH (ref 0.61–1.24)
GFR calc Af Amer: 54 mL/min — ABNORMAL LOW (ref >60)
GFR calc non Af Amer: 47 mL/min — ABNORMAL LOW (ref >60)

## 2018-02-17 LAB — GLUCOSE, CAPILLARY
GLUCOSE-CAPILLARY: 117 mg/dL — AB (ref 70–99)
GLUCOSE-CAPILLARY: 137 mg/dL — AB (ref 70–99)
GLUCOSE-CAPILLARY: 150 mg/dL — AB (ref 70–99)
Glucose-Capillary: 104 mg/dL — ABNORMAL HIGH (ref 70–99)
Glucose-Capillary: 93 mg/dL (ref 70–99)
Glucose-Capillary: 101 mg/dL — ABNORMAL HIGH (ref 70–99)
Glucose-Capillary: 75 mg/dL (ref 70–99)
Glucose-Capillary: 119 mg/dL — ABNORMAL HIGH (ref 70–99)
Glucose-Capillary: 133 mg/dL — ABNORMAL HIGH (ref 70–99)

## 2018-02-17 LAB — HEMOGLOBIN AND HEMATOCRIT, BLOOD
HEMATOCRIT: 27.2 % — AB (ref 39.0–52.0)
Hemoglobin: 9 g/dL — ABNORMAL LOW (ref 13.0–17.0)

## 2018-02-17 LAB — CBC
HCT: 28.7 % — ABNORMAL LOW (ref 39.0–52.0)
HCT: 30 % — ABNORMAL LOW (ref 39.0–52.0)
Hemoglobin: 9.5 g/dL — ABNORMAL LOW (ref 13.0–17.0)
Hemoglobin: 9.6 g/dL — ABNORMAL LOW (ref 13.0–17.0)
MCH: 31.3 pg (ref 26.0–34.0)
MCH: 30.8 pg (ref 26.0–34.0)
MCHC: 33.1 g/dL (ref 30.0–36.0)
MCHC: 32 g/dL (ref 30.0–36.0)
MCV: 94.4 fL (ref 80.0–100.0)
MCV: 96.2 fL (ref 80.0–100.0)
Platelets: 102 10*3/uL — ABNORMAL LOW (ref 150–400)
Platelets: 111 10*3/uL — ABNORMAL LOW (ref 150–400)
RBC: 3.04 MIL/uL — AB (ref 4.22–5.81)
RBC: 3.12 MIL/uL — AB (ref 4.22–5.81)
RDW: 13.6 % (ref 11.5–15.5)
RDW: 13.9 % (ref 11.5–15.5)
WBC: 6.4 10*3/uL (ref 4.0–10.5)
WBC: 6.2 10*3/uL (ref 4.0–10.5)
nRBC: 0 % (ref 0.0–0.2)
nRBC: 0 % (ref 0.0–0.2)

## 2018-02-17 LAB — POCT I-STAT 4, (NA,K, GLUC, HGB,HCT)
Glucose, Bld: 111 mg/dL — ABNORMAL HIGH (ref 70–99)
HEMATOCRIT: 27 % — AB (ref 39.0–52.0)
Hemoglobin: 9.2 g/dL — ABNORMAL LOW (ref 13.0–17.0)
POTASSIUM: 4.2 mmol/L (ref 3.5–5.1)
Sodium: 142 mmol/L (ref 135–145)

## 2018-02-17 LAB — MAGNESIUM: MAGNESIUM: 3.6 mg/dL — AB (ref 1.7–2.4)

## 2018-02-17 LAB — PLATELET COUNT: Platelets: 110 10*3/uL — ABNORMAL LOW (ref 150–400)

## 2018-02-17 LAB — PROTIME-INR
INR: 1.45
Prothrombin Time: 17.5 seconds — ABNORMAL HIGH (ref 11.4–15.2)

## 2018-02-17 SURGERY — CORONARY ARTERY BYPASS GRAFTING (CABG)
Anesthesia: General | Site: Chest

## 2018-02-17 MED ORDER — PROPOFOL 10 MG/ML IV BOLUS
INTRAVENOUS | Status: DC | PRN
  Administered 2018-02-17: 20 mg via INTRAVENOUS

## 2018-02-17 MED ORDER — HEPARIN SODIUM (PORCINE) 1000 UNIT/ML IJ SOLN
INTRAMUSCULAR | Status: AC
  Filled 2018-02-17: qty 1

## 2018-02-17 MED ORDER — FENTANYL CITRATE (PF) 250 MCG/5ML IJ SOLN
INTRAMUSCULAR | Status: DC | PRN
  Administered 2018-02-17: 100 ug via INTRAVENOUS
  Administered 2018-02-17: 150 ug via INTRAVENOUS
  Administered 2018-02-17 (×3): 50 ug via INTRAVENOUS
  Administered 2018-02-17: 150 ug via INTRAVENOUS
  Administered 2018-02-17: 50 ug via INTRAVENOUS
  Administered 2018-02-17 (×2): 100 ug via INTRAVENOUS
  Administered 2018-02-17: 50 ug via INTRAVENOUS
  Administered 2018-02-17: 150 ug via INTRAVENOUS

## 2018-02-17 MED ORDER — SODIUM CHLORIDE 0.9% FLUSH: 3.0000 mL | INTRAVENOUS | Status: DC | PRN

## 2018-02-17 MED ORDER — MILRINONE LACTATE IN DEXTROSE 20-5 MG/100ML-% IV SOLN
0.0000 ug/kg/min | INTRAVENOUS | Status: DC
  Administered 2018-02-17: 0.3 ug/kg/min via INTRAVENOUS
  Filled 2018-02-17 (×3): qty 100

## 2018-02-17 MED ORDER — ACETAMINOPHEN 160 MG/5ML PO SOLN
1000.0000 mg | Freq: Four times a day (QID) | ORAL | Status: DC
  Administered 2018-02-18: 1000 mg
  Filled 2018-02-17: qty 40.6

## 2018-02-17 MED ORDER — FAMOTIDINE IN NACL 20-0.9 MG/50ML-% IV SOLN
20.0000 mg | Freq: Two times a day (BID) | INTRAVENOUS | Status: AC
  Administered 2018-02-17 (×2): 20 mg via INTRAVENOUS
  Filled 2018-02-17: qty 50

## 2018-02-17 MED ORDER — PHENYLEPHRINE 40 MCG/ML (10ML) SYRINGE FOR IV PUSH (FOR BLOOD PRESSURE SUPPORT)
PREFILLED_SYRINGE | INTRAVENOUS | Status: AC
  Filled 2018-02-17: qty 10

## 2018-02-17 MED ORDER — CHLORHEXIDINE GLUCONATE 4 % EX LIQD: 30.0000 mL | CUTANEOUS | Status: DC

## 2018-02-17 MED ORDER — MIDAZOLAM HCL 2 MG/2ML IJ SOLN: 2.0000 mg | INTRAMUSCULAR | Status: DC | PRN

## 2018-02-17 MED ORDER — OXYCODONE HCL 5 MG PO TABS: 5.0000 mg | ORAL_TABLET | ORAL | Status: DC | PRN

## 2018-02-17 MED ORDER — PHENYLEPHRINE HCL-NACL 20-0.9 MG/250ML-% IV SOLN
0.0000 ug/min | INTRAVENOUS | Status: DC
  Administered 2018-02-17: 70 ug/min via INTRAVENOUS
  Administered 2018-02-18: 82 ug/min via INTRAVENOUS
  Administered 2018-02-18: 20 ug/min via INTRAVENOUS
  Administered 2018-02-18: 82 ug/min via INTRAVENOUS
  Filled 2018-02-17 (×4): qty 250

## 2018-02-17 MED ORDER — INSULIN REGULAR(HUMAN) IN NACL 100-0.9 UT/100ML-% IV SOLN: INTRAVENOUS | Status: DC

## 2018-02-17 MED ORDER — DOPAMINE-DEXTROSE 3.2-5 MG/ML-% IV SOLN: 3.0000 ug/kg/min | INTRAVENOUS | Status: DC

## 2018-02-17 MED ORDER — ACETAMINOPHEN 500 MG PO TABS
1000.0000 mg | ORAL_TABLET | Freq: Four times a day (QID) | ORAL | Status: DC
  Administered 2018-02-18 – 2018-02-22 (×15): 1000 mg via ORAL
  Filled 2018-02-17 (×17): qty 2

## 2018-02-17 MED ORDER — MIDAZOLAM HCL (PF) 10 MG/2ML IJ SOLN
INTRAMUSCULAR | Status: AC
  Filled 2018-02-17: qty 2

## 2018-02-17 MED ORDER — METOPROLOL TARTRATE 12.5 MG HALF TABLET
ORAL_TABLET | ORAL | Status: AC
  Administered 2018-02-17: 12.5 mg via ORAL
  Filled 2018-02-17: qty 1

## 2018-02-17 MED ORDER — ETOMIDATE 2 MG/ML IV SOLN
INTRAVENOUS | Status: DC | PRN
  Administered 2018-02-17: 10 mg via INTRAVENOUS

## 2018-02-17 MED ORDER — CHLORHEXIDINE GLUCONATE 0.12% ORAL RINSE (MEDLINE KIT)
15.0000 mL | Freq: Two times a day (BID) | OROMUCOSAL | Status: DC
  Administered 2018-02-17: 15 mL via OROMUCOSAL

## 2018-02-17 MED ORDER — PROPOFOL 10 MG/ML IV BOLUS
INTRAVENOUS | Status: AC
  Filled 2018-02-17: qty 20

## 2018-02-17 MED ORDER — PANTOPRAZOLE SODIUM 40 MG PO TBEC
40.0000 mg | DELAYED_RELEASE_TABLET | Freq: Every day | ORAL | Status: DC
  Administered 2018-02-19 – 2018-02-22 (×4): 40 mg via ORAL
  Filled 2018-02-17 (×4): qty 1

## 2018-02-17 MED ORDER — HEMOSTATIC AGENTS (NO CHARGE) OPTIME
TOPICAL | Status: DC | PRN
  Administered 2018-02-17: 1 via TOPICAL

## 2018-02-17 MED ORDER — INSULIN REGULAR BOLUS VIA INFUSION
0.0000 [IU] | Freq: Three times a day (TID) | INTRAVENOUS | Status: DC
  Filled 2018-02-17: qty 10

## 2018-02-17 MED ORDER — DEXMEDETOMIDINE HCL IN NACL 200 MCG/50ML IV SOLN
0.0000 ug/kg/h | INTRAVENOUS | Status: DC
  Administered 2018-02-17: 0.5 ug/kg/h via INTRAVENOUS

## 2018-02-17 MED ORDER — DOPAMINE-DEXTROSE 3.2-5 MG/ML-% IV SOLN
INTRAVENOUS | Status: DC | PRN
  Administered 2018-02-17: 3 ug/kg/min via INTRAVENOUS

## 2018-02-17 MED ORDER — ASPIRIN EC 325 MG PO TBEC
325.0000 mg | DELAYED_RELEASE_TABLET | Freq: Every day | ORAL | Status: DC
  Administered 2018-02-18 – 2018-02-22 (×4): 325 mg via ORAL
  Filled 2018-02-17 (×5): qty 1

## 2018-02-17 MED ORDER — HEPARIN SODIUM (PORCINE) 1000 UNIT/ML IJ SOLN
INTRAMUSCULAR | Status: DC | PRN
  Administered 2018-02-17: 10000 [IU] via INTRAVENOUS
  Administered 2018-02-17: 23000 [IU] via INTRAVENOUS

## 2018-02-17 MED ORDER — TRAMADOL HCL 50 MG PO TABS
50.0000 mg | ORAL_TABLET | ORAL | Status: DC | PRN
  Administered 2018-02-18: 50 mg via ORAL
  Filled 2018-02-17: qty 1

## 2018-02-17 MED ORDER — SUCCINYLCHOLINE CHLORIDE 200 MG/10ML IV SOSY
PREFILLED_SYRINGE | INTRAVENOUS | Status: AC
  Filled 2018-02-17: qty 10

## 2018-02-17 MED ORDER — SODIUM CHLORIDE 0.9 % IV SOLN
INTRAVENOUS | Status: DC | PRN
  Administered 2018-02-17: 10:00:00 via INTRAVENOUS

## 2018-02-17 MED ORDER — CHLORHEXIDINE GLUCONATE 0.12 % MT SOLN
15.0000 mL | Freq: Once | OROMUCOSAL | Status: AC
  Administered 2018-02-17: 15 mL via OROMUCOSAL

## 2018-02-17 MED ORDER — TRANEXAMIC ACID 1000 MG/10ML IV SOLN: INTRAVENOUS | Status: DC | PRN

## 2018-02-17 MED ORDER — THROMBIN 5000 UNITS EX SOLR
CUTANEOUS | Status: AC
  Filled 2018-02-17: qty 15000

## 2018-02-17 MED ORDER — ROCURONIUM BROMIDE 50 MG/5ML IV SOSY
PREFILLED_SYRINGE | INTRAVENOUS | Status: AC
  Filled 2018-02-17: qty 5

## 2018-02-17 MED ORDER — LIDOCAINE 2% (20 MG/ML) 5 ML SYRINGE
INTRAMUSCULAR | Status: AC
  Filled 2018-02-17: qty 5

## 2018-02-17 MED ORDER — FENTANYL CITRATE (PF) 250 MCG/5ML IJ SOLN
INTRAMUSCULAR | Status: AC
  Filled 2018-02-17: qty 5

## 2018-02-17 MED ORDER — METOPROLOL TARTRATE 5 MG/5ML IV SOLN: 2.5000 mg | INTRAVENOUS | Status: DC | PRN

## 2018-02-17 MED ORDER — ROCURONIUM BROMIDE 10 MG/ML (PF) SYRINGE
PREFILLED_SYRINGE | INTRAVENOUS | Status: DC | PRN
  Administered 2018-02-17 (×2): 50 mg via INTRAVENOUS
  Administered 2018-02-17: 30 mg via INTRAVENOUS
  Administered 2018-02-17: 40 mg via INTRAVENOUS

## 2018-02-17 MED ORDER — PLASMA-LYTE 148 IV SOLN
INTRAVENOUS | Status: DC | PRN
  Administered 2018-02-17: 500 mL via INTRAVASCULAR

## 2018-02-17 MED ORDER — 0.9 % SODIUM CHLORIDE (POUR BTL) OPTIME
TOPICAL | Status: DC | PRN
  Administered 2018-02-17: 1000 mL

## 2018-02-17 MED ORDER — ACETAMINOPHEN 160 MG/5ML PO SOLN: 650.0000 mg | Freq: Once | ORAL | Status: AC

## 2018-02-17 MED ORDER — PROTAMINE SULFATE 10 MG/ML IV SOLN
INTRAVENOUS | Status: AC
  Filled 2018-02-17: qty 25

## 2018-02-17 MED ORDER — BISACODYL 5 MG PO TBEC
10.0000 mg | DELAYED_RELEASE_TABLET | Freq: Every day | ORAL | Status: DC
  Administered 2018-02-18 – 2018-02-21 (×3): 10 mg via ORAL
  Filled 2018-02-17 (×4): qty 2

## 2018-02-17 MED ORDER — LACTATED RINGERS IV SOLN: INTRAVENOUS | Status: DC

## 2018-02-17 MED ORDER — MILRINONE LACTATE IN DEXTROSE 20-5 MG/100ML-% IV SOLN
INTRAVENOUS | Status: DC | PRN
  Administered 2018-02-17: .3 ug/kg/min via INTRAVENOUS

## 2018-02-17 MED ORDER — ROSUVASTATIN CALCIUM 20 MG PO TABS
40.0000 mg | ORAL_TABLET | Freq: Every day | ORAL | Status: DC
  Administered 2018-02-18 – 2018-02-21 (×4): 40 mg via ORAL
  Filled 2018-02-17 (×4): qty 2

## 2018-02-17 MED ORDER — METOPROLOL TARTRATE 25 MG/10 ML ORAL SUSPENSION: 12.5000 mg | Freq: Two times a day (BID) | ORAL | Status: DC

## 2018-02-17 MED ORDER — SODIUM CHLORIDE 0.9 % IV SOLN
INTRAVENOUS | Status: DC
  Administered 2018-02-17: 14:00:00 via INTRAVENOUS

## 2018-02-17 MED ORDER — LACTATED RINGERS IV SOLN
INTRAVENOUS | Status: DC | PRN
  Administered 2018-02-17: 08:00:00 via INTRAVENOUS

## 2018-02-17 MED ORDER — LACTATED RINGERS IV SOLN
INTRAVENOUS | Status: DC
  Administered 2018-02-17 – 2018-02-20 (×2): via INTRAVENOUS

## 2018-02-17 MED ORDER — VANCOMYCIN HCL IN DEXTROSE 1-5 GM/200ML-% IV SOLN
1000.0000 mg | Freq: Once | INTRAVENOUS | Status: AC
  Administered 2018-02-17: 1000 mg via INTRAVENOUS
  Filled 2018-02-17: qty 200

## 2018-02-17 MED ORDER — MAGNESIUM SULFATE 4 GM/100ML IV SOLN
4.0000 g | Freq: Once | INTRAVENOUS | Status: AC
  Administered 2018-02-17: 4 g via INTRAVENOUS
  Filled 2018-02-17: qty 100

## 2018-02-17 MED ORDER — METOPROLOL TARTRATE 12.5 MG HALF TABLET: 12.5000 mg | ORAL_TABLET | Freq: Two times a day (BID) | ORAL | Status: DC

## 2018-02-17 MED ORDER — SODIUM CHLORIDE 0.9% FLUSH
3.0000 mL | Freq: Two times a day (BID) | INTRAVENOUS | Status: DC
  Administered 2018-02-18 – 2018-02-19 (×3): 3 mL via INTRAVENOUS
  Administered 2018-02-19: 10 mL via INTRAVENOUS
  Administered 2018-02-20 – 2018-02-21 (×2): 3 mL via INTRAVENOUS

## 2018-02-17 MED ORDER — MIDAZOLAM HCL 2 MG/2ML IJ SOLN
INTRAMUSCULAR | Status: AC
  Filled 2018-02-17: qty 2

## 2018-02-17 MED ORDER — MORPHINE SULFATE (PF) 2 MG/ML IV SOLN
1.0000 mg | INTRAVENOUS | Status: DC | PRN
  Administered 2018-02-18: 2 mg via INTRAVENOUS
  Administered 2018-02-18: 1 mg via INTRAVENOUS
  Filled 2018-02-17 (×2): qty 1

## 2018-02-17 MED ORDER — LIDOCAINE 2% (20 MG/ML) 5 ML SYRINGE
INTRAMUSCULAR | Status: DC | PRN
  Administered 2018-02-17: 80 mg via INTRAVENOUS

## 2018-02-17 MED ORDER — METOPROLOL TARTRATE 12.5 MG HALF TABLET
12.5000 mg | ORAL_TABLET | Freq: Once | ORAL | Status: AC
  Administered 2018-02-17: 12.5 mg via ORAL

## 2018-02-17 MED ORDER — ONDANSETRON HCL 4 MG/2ML IJ SOLN
4.0000 mg | Freq: Four times a day (QID) | INTRAMUSCULAR | Status: DC | PRN
  Administered 2018-02-17: 4 mg via INTRAVENOUS

## 2018-02-17 MED ORDER — MIDAZOLAM HCL 5 MG/5ML IJ SOLN
INTRAMUSCULAR | Status: DC | PRN
  Administered 2018-02-17: 2 mg via INTRAVENOUS
  Administered 2018-02-17: 1 mg via INTRAVENOUS
  Administered 2018-02-17: 4 mg via INTRAVENOUS
  Administered 2018-02-17: 2 mg via INTRAVENOUS
  Administered 2018-02-17: 1 mg via INTRAVENOUS
  Administered 2018-02-17: 2 mg via INTRAVENOUS

## 2018-02-17 MED ORDER — ASPIRIN 81 MG PO CHEW
324.0000 mg | CHEWABLE_TABLET | Freq: Every day | ORAL | Status: DC
  Administered 2018-02-19: 324 mg
  Filled 2018-02-17: qty 4

## 2018-02-17 MED ORDER — ORAL CARE MOUTH RINSE
15.0000 mL | OROMUCOSAL | Status: DC
  Administered 2018-02-17 – 2018-02-18 (×4): 15 mL via OROMUCOSAL

## 2018-02-17 MED ORDER — DOCUSATE SODIUM 100 MG PO CAPS
200.0000 mg | ORAL_CAPSULE | Freq: Every day | ORAL | Status: DC
  Administered 2018-02-18 – 2018-02-19 (×2): 200 mg via ORAL
  Filled 2018-02-17 (×4): qty 2

## 2018-02-17 MED ORDER — NITROGLYCERIN IN D5W 200-5 MCG/ML-% IV SOLN: 0.0000 ug/min | INTRAVENOUS | Status: DC

## 2018-02-17 MED ORDER — SODIUM CHLORIDE 0.9 % IV SOLN
1.5000 g | Freq: Two times a day (BID) | INTRAVENOUS | Status: AC
  Administered 2018-02-17 – 2018-02-19 (×4): 1.5 g via INTRAVENOUS
  Filled 2018-02-17 (×4): qty 1.5

## 2018-02-17 MED ORDER — POTASSIUM CHLORIDE 10 MEQ/50ML IV SOLN: 10.0000 meq | INTRAVENOUS | Status: AC

## 2018-02-17 MED ORDER — CHLORHEXIDINE GLUCONATE 0.12 % MT SOLN
15.0000 mL | OROMUCOSAL | Status: AC
  Administered 2018-02-17: 15 mL via OROMUCOSAL

## 2018-02-17 MED ORDER — ACETAMINOPHEN 650 MG RE SUPP
650.0000 mg | Freq: Once | RECTAL | Status: AC
  Administered 2018-02-17: 650 mg via RECTAL

## 2018-02-17 MED ORDER — LATANOPROST 0.005 % OP SOLN
1.0000 [drp] | Freq: Every day | OPHTHALMIC | Status: DC
  Administered 2018-02-18 – 2018-02-21 (×4): 1 [drp] via OPHTHALMIC
  Filled 2018-02-17: qty 2.5

## 2018-02-17 MED ORDER — BISACODYL 10 MG RE SUPP
10.0000 mg | Freq: Every day | RECTAL | Status: DC
  Filled 2018-02-17: qty 1

## 2018-02-17 MED ORDER — PROTAMINE SULFATE 10 MG/ML IV SOLN
INTRAVENOUS | Status: DC | PRN
  Administered 2018-02-17 (×4): 50 mg via INTRAVENOUS
  Administered 2018-02-17: 30 mg via INTRAVENOUS

## 2018-02-17 MED ORDER — FENTANYL CITRATE (PF) 250 MCG/5ML IJ SOLN
INTRAMUSCULAR | Status: AC
  Filled 2018-02-17: qty 20

## 2018-02-17 MED ORDER — SODIUM CHLORIDE 0.9 % IV SOLN
INTRAVENOUS | Status: DC | PRN
  Administered 2018-02-17: 15 ug/min via INTRAVENOUS

## 2018-02-17 MED ORDER — ALBUMIN HUMAN 5 % IV SOLN
INTRAVENOUS | Status: DC | PRN
  Administered 2018-02-17 (×3): via INTRAVENOUS

## 2018-02-17 MED ORDER — SODIUM CHLORIDE 0.9 % IV SOLN
INTRAVENOUS | Status: DC | PRN
  Administered 2018-02-17: 25 ug/min via INTRAVENOUS

## 2018-02-17 MED ORDER — THROMBIN 20000 UNITS EX SOLR
OROMUCOSAL | Status: DC | PRN
  Administered 2018-02-17 (×3): 3 mL via TOPICAL

## 2018-02-17 MED ORDER — SODIUM CHLORIDE 0.9 % IV SOLN: 250.0000 mL | INTRAVENOUS | Status: DC

## 2018-02-17 MED ORDER — CHLORHEXIDINE GLUCONATE 0.12 % MT SOLN
OROMUCOSAL | Status: AC
  Administered 2018-02-17: 15 mL via OROMUCOSAL
  Filled 2018-02-17: qty 15

## 2018-02-17 MED ORDER — ALBUMIN HUMAN 5 % IV SOLN
250.0000 mL | INTRAVENOUS | Status: AC | PRN
  Administered 2018-02-17 (×4): 12.5 g via INTRAVENOUS
  Filled 2018-02-17: qty 500

## 2018-02-17 MED ORDER — SODIUM CHLORIDE 0.45 % IV SOLN
INTRAVENOUS | Status: DC | PRN
  Administered 2018-02-17: 14:00:00 via INTRAVENOUS

## 2018-02-17 MED ORDER — ARTIFICIAL TEARS OPHTHALMIC OINT
TOPICAL_OINTMENT | OPHTHALMIC | Status: DC | PRN
  Administered 2018-02-17: 1 via OPHTHALMIC

## 2018-02-17 MED ORDER — SODIUM CHLORIDE 0.9 % IV SOLN
INTRAVENOUS | Status: DC | PRN
  Administered 2018-02-17: 1 [IU]/h via INTRAVENOUS

## 2018-02-17 MED ORDER — LACTATED RINGERS IV SOLN
INTRAVENOUS | Status: DC | PRN
  Administered 2018-02-17 (×4): via INTRAVENOUS

## 2018-02-17 MED ORDER — LACTATED RINGERS IV SOLN
500.0000 mL | Freq: Once | INTRAVENOUS | Status: AC | PRN
  Administered 2018-02-17: 500 mL via INTRAVENOUS

## 2018-02-17 SURGICAL SUPPLY — 114 items
ADH SKN CLS APL DERMABOND .7 (GAUZE/BANDAGES/DRESSINGS) ×2
BAG DECANTER FOR FLEXI CONT (MISCELLANEOUS) ×4 IMPLANT
BANDAGE ACE 4X5 VEL STRL LF (GAUZE/BANDAGES/DRESSINGS) ×4 IMPLANT
BANDAGE ACE 6X5 VEL STRL LF (GAUZE/BANDAGES/DRESSINGS) ×4 IMPLANT
BANDAGE ELASTIC 4 VELCRO ST LF (GAUZE/BANDAGES/DRESSINGS) ×2 IMPLANT
BANDAGE ELASTIC 6 VELCRO ST LF (GAUZE/BANDAGES/DRESSINGS) ×2 IMPLANT
BASKET HEART  (ORDER IN 25'S) (MISCELLANEOUS) ×1
BASKET HEART (ORDER IN 25'S) (MISCELLANEOUS) ×1
BASKET HEART (ORDER IN 25S) (MISCELLANEOUS) ×2 IMPLANT
BLADE STERNUM SYSTEM 6 (BLADE) ×4 IMPLANT
BLADE SURG 11 STRL SS (BLADE) ×2 IMPLANT
BNDG GAUZE ELAST 4 BULKY (GAUZE/BANDAGES/DRESSINGS) ×4 IMPLANT
CANISTER SUCT 3000ML PPV (MISCELLANEOUS) ×4 IMPLANT
CATH ROBINSON RED A/P 18FR (CATHETERS) ×8 IMPLANT
CATH THORACIC 28FR (CATHETERS) ×4 IMPLANT
CATH THORACIC 36FR (CATHETERS) ×4 IMPLANT
CATH THORACIC 36FR RT ANG (CATHETERS) ×4 IMPLANT
CLIP VESOCCLUDE MED 24/CT (CLIP) IMPLANT
CLIP VESOCCLUDE SM WIDE 24/CT (CLIP) ×4 IMPLANT
COVER WAND RF STERILE (DRAPES) ×4 IMPLANT
CRADLE DONUT ADULT HEAD (MISCELLANEOUS) ×4 IMPLANT
DERMABOND ADVANCED (GAUZE/BANDAGES/DRESSINGS) ×2
DERMABOND ADVANCED .7 DNX12 (GAUZE/BANDAGES/DRESSINGS) IMPLANT
DRAPE CARDIOVASCULAR INCISE (DRAPES) ×4
DRAPE SLUSH/WARMER DISC (DRAPES) ×4 IMPLANT
DRAPE SRG 135X102X78XABS (DRAPES) ×2 IMPLANT
DRSG COVADERM 4X14 (GAUZE/BANDAGES/DRESSINGS) ×4 IMPLANT
ELECT CAUTERY BLADE 6.4 (BLADE) ×4 IMPLANT
ELECT REM PT RETURN 9FT ADLT (ELECTROSURGICAL) ×8
ELECTRODE REM PT RTRN 9FT ADLT (ELECTROSURGICAL) ×4 IMPLANT
FELT TEFLON 1X6 (MISCELLANEOUS) ×6 IMPLANT
GAUZE SPONGE 4X4 12PLY STRL (GAUZE/BANDAGES/DRESSINGS) ×8 IMPLANT
GAUZE SPONGE 4X4 12PLY STRL LF (GAUZE/BANDAGES/DRESSINGS) ×4 IMPLANT
GLOVE BIO SURGEON STRL SZ 6 (GLOVE) IMPLANT
GLOVE BIO SURGEON STRL SZ 6.5 (GLOVE) ×9 IMPLANT
GLOVE BIO SURGEON STRL SZ7 (GLOVE) IMPLANT
GLOVE BIO SURGEON STRL SZ7.5 (GLOVE) IMPLANT
GLOVE BIO SURGEONS STRL SZ 6.5 (GLOVE) ×9
GLOVE BIOGEL PI IND STRL 6 (GLOVE) IMPLANT
GLOVE BIOGEL PI IND STRL 6.5 (GLOVE) IMPLANT
GLOVE BIOGEL PI IND STRL 7.0 (GLOVE) IMPLANT
GLOVE BIOGEL PI INDICATOR 6 (GLOVE)
GLOVE BIOGEL PI INDICATOR 6.5 (GLOVE)
GLOVE BIOGEL PI INDICATOR 7.0 (GLOVE) ×8
GLOVE EUDERMIC 7 POWDERFREE (GLOVE) ×8 IMPLANT
GLOVE ORTHO TXT STRL SZ7.5 (GLOVE) IMPLANT
GLOVE SURG SS PI 6.0 STRL IVOR (GLOVE) ×2 IMPLANT
GOWN STRL REUS W/ TWL LRG LVL3 (GOWN DISPOSABLE) ×8 IMPLANT
GOWN STRL REUS W/ TWL XL LVL3 (GOWN DISPOSABLE) ×2 IMPLANT
GOWN STRL REUS W/TWL LRG LVL3 (GOWN DISPOSABLE) ×40
GOWN STRL REUS W/TWL XL LVL3 (GOWN DISPOSABLE) ×4
HEMOSTAT POWDER SURGIFOAM 1G (HEMOSTASIS) ×12 IMPLANT
HEMOSTAT SURGICEL 2X14 (HEMOSTASIS) ×4 IMPLANT
INSERT FOGARTY 61MM (MISCELLANEOUS) IMPLANT
INSERT FOGARTY XLG (MISCELLANEOUS) IMPLANT
KIT BASIN OR (CUSTOM PROCEDURE TRAY) ×4 IMPLANT
KIT CATH CPB BARTLE (MISCELLANEOUS) ×4 IMPLANT
KIT SUCTION CATH 14FR (SUCTIONS) ×4 IMPLANT
KIT TURNOVER KIT B (KITS) ×4 IMPLANT
KIT VASOVIEW HEMOPRO 2 VH 4000 (KITS) ×4 IMPLANT
LINE EXTENSION DELIVERY (MISCELLANEOUS) ×2 IMPLANT
NS IRRIG 1000ML POUR BTL (IV SOLUTION) ×20 IMPLANT
PACK E OPEN HEART (SUTURE) ×4 IMPLANT
PACK OPEN HEART (CUSTOM PROCEDURE TRAY) ×4 IMPLANT
PAD ARMBOARD 7.5X6 YLW CONV (MISCELLANEOUS) ×8 IMPLANT
PAD ELECT DEFIB RADIOL ZOLL (MISCELLANEOUS) ×4 IMPLANT
PENCIL BUTTON HOLSTER BLD 10FT (ELECTRODE) ×4 IMPLANT
PUNCH AORTIC ROTATE  4.5MM 8IN (MISCELLANEOUS) ×2 IMPLANT
PUNCH AORTIC ROTATE 4.0MM (MISCELLANEOUS) IMPLANT
PUNCH AORTIC ROTATE 4.5MM 8IN (MISCELLANEOUS) ×2 IMPLANT
PUNCH AORTIC ROTATE 5MM 8IN (MISCELLANEOUS) IMPLANT
SET CARDIOPLEGIA MPS 5001102 (MISCELLANEOUS) ×2 IMPLANT
SOLUTION ANTI FOG 6CC (MISCELLANEOUS) ×2 IMPLANT
SPONGE INTESTINAL PEANUT (DISPOSABLE) IMPLANT
SPONGE LAP 18X18 RF (DISPOSABLE) ×4 IMPLANT
SPONGE LAP 18X18 X RAY DECT (DISPOSABLE) IMPLANT
SPONGE LAP 4X18 RFD (DISPOSABLE) ×4 IMPLANT
SUT BONE WAX W31G (SUTURE) ×4 IMPLANT
SUT MNCRL AB 3-0 PS2 18 (SUTURE) ×2 IMPLANT
SUT MNCRL AB 4-0 PS2 18 (SUTURE) IMPLANT
SUT PROLENE 3 0 SH DA (SUTURE) IMPLANT
SUT PROLENE 3 0 SH1 36 (SUTURE) ×4 IMPLANT
SUT PROLENE 4 0 RB 1 (SUTURE)
SUT PROLENE 4 0 SH DA (SUTURE) IMPLANT
SUT PROLENE 4-0 RB1 .5 CRCL 36 (SUTURE) IMPLANT
SUT PROLENE 5 0 C 1 36 (SUTURE) IMPLANT
SUT PROLENE 6 0 C 1 30 (SUTURE) IMPLANT
SUT PROLENE 7 0 BV 1 (SUTURE) IMPLANT
SUT PROLENE 7 0 BV1 MDA (SUTURE) ×6 IMPLANT
SUT PROLENE 8 0 BV175 6 (SUTURE) IMPLANT
SUT SILK  1 MH (SUTURE) ×2
SUT SILK 1 MH (SUTURE) IMPLANT
SUT SILK 2 0SH CR/8 30 (SUTURE) ×2 IMPLANT
SUT STEEL STERNAL CCS#1 18IN (SUTURE) IMPLANT
SUT STEEL SZ 6 DBL 3X14 BALL (SUTURE) IMPLANT
SUT VIC AB 1 CTX 36 (SUTURE) ×8
SUT VIC AB 1 CTX36XBRD ANBCTR (SUTURE) ×4 IMPLANT
SUT VIC AB 2-0 CT1 27 (SUTURE) ×4
SUT VIC AB 2-0 CT1 TAPERPNT 27 (SUTURE) IMPLANT
SUT VIC AB 2-0 CTX 27 (SUTURE) IMPLANT
SUT VIC AB 3-0 SH 27 (SUTURE)
SUT VIC AB 3-0 SH 27X BRD (SUTURE) IMPLANT
SUT VIC AB 3-0 X1 27 (SUTURE) IMPLANT
SUT VICRYL 4-0 PS2 18IN ABS (SUTURE) IMPLANT
SYSTEM SAHARA CHEST DRAIN ATS (WOUND CARE) ×4 IMPLANT
TABLE PACK (MISCELLANEOUS) ×2 IMPLANT
TAPE CLOTH SURG 4X10 WHT LF (GAUZE/BANDAGES/DRESSINGS) ×4 IMPLANT
TAPE PAPER 3X10 WHT MICROPORE (GAUZE/BANDAGES/DRESSINGS) ×2 IMPLANT
TOWEL GREEN STERILE (TOWEL DISPOSABLE) ×4 IMPLANT
TOWEL GREEN STERILE FF (TOWEL DISPOSABLE) ×4 IMPLANT
TRAY FOLEY SLVR 16FR TEMP STAT (SET/KITS/TRAYS/PACK) ×4 IMPLANT
TUBING INSUFFLATION (TUBING) ×4 IMPLANT
UNDERPAD 30X30 (UNDERPADS AND DIAPERS) ×4 IMPLANT
WATER STERILE IRR 1000ML POUR (IV SOLUTION) ×8 IMPLANT

## 2018-02-17 NOTE — Anesthesia Procedure Notes (Addendum)
Arterial Line Insertion Start/End12/02/2018 7:00 AM, 02/17/2018 7:10 AM Performed by: Lavell Luster, CRNA, CRNA  Patient location: Pre-op. Preanesthetic checklist: patient identified, IV checked, site marked, risks and benefits discussed, surgical consent, monitors and equipment checked, pre-op evaluation and timeout performed Lidocaine 1% used for infiltration Left, radial was placed Catheter size: 20 G Hand hygiene performed , maximum sterile barriers used  and Seldinger technique used Allen's test indicative of satisfactory collateral circulation Attempts: 1 Procedure performed without using ultrasound guided technique. Following insertion, Biopatch and dressing applied. Post procedure assessment: normal  Patient tolerated the procedure well with no immediate complications.

## 2018-02-17 NOTE — Anesthesia Procedure Notes (Addendum)
Central Venous Catheter Insertion Performed by: Belinda Block, MD, anesthesiologist Start/End12/02/2018 6:50 AM, 02/17/2018 7:10 AM Preanesthetic checklist: patient identified, IV checked, site marked, risks and benefits discussed, surgical consent, monitors and equipment checked, pre-op evaluation and timeout performed Position: Trendelenburg Lidocaine 1% used for infiltration and patient sedated Hand hygiene performed , maximum sterile barriers used  and Seldinger technique used Central line was placed.Sheath introducer Swan type:thermodilution Procedure performed using ultrasound guided technique. Ultrasound Notes:anatomy identified and image(s) printed for medical record Attempts: 1 Following insertion, line sutured, dressing applied and Biopatch. Post procedure assessment: blood return through all ports  Patient tolerated the procedure well with no immediate complications.

## 2018-02-17 NOTE — Brief Op Note (Signed)
02/17/2018  2:34 PM  PATIENT:  Luis Chen  76 y.o. male  PRE-OPERATIVE DIAGNOSIS:  CAD  POST-OPERATIVE DIAGNOSIS:  CAD  PROCEDURE:  Procedure(s) with comments: CORONARY ARTERY BYPASS GRAFTING (CABG) (N/A) - Times 3 using endoscopically harvested right saphenous vein.   TRANSESOPHAGEAL ECHOCARDIOGRAM (TEE) (N/A)  SVG to LAD SVG to OM1 and OM2  SURGEON:  Surgeon(s) and Role:    * Bartle, Fernande Boyden, MD - Primary  PHYSICIAN ASSISTANT:  Nicholes Rough, PA-C   ANESTHESIA:   general  EBL:  600 mL   BLOOD ADMINISTERED:none  DRAINS: routine   LOCAL MEDICATIONS USED:  NONE  SPECIMEN:  No Specimen  DISPOSITION OF SPECIMEN:  N/A  COUNTS:  YES  TOURNIQUET:  * No tourniquets in log *  DICTATION: .Dragon Dictation  PLAN OF CARE: Admit to inpatient   PATIENT DISPOSITION:  ICU - intubated and hemodynamically stable.   Delay start of Pharmacological VTE agent (>24hrs) due to surgical blood loss or risk of bleeding: yes

## 2018-02-17 NOTE — Transfer of Care (Signed)
Immediate Anesthesia Transfer of Care Note  Patient: Luis Chen  Procedure(s) Performed: CORONARY ARTERY BYPASS GRAFTING (CABG) (N/A Chest) TRANSESOPHAGEAL ECHOCARDIOGRAM (TEE) (N/A )  Patient Location: SICU  Anesthesia Type:General  Level of Consciousness: Patient remains intubated per anesthesia plan  Airway & Oxygen Therapy: Patient remains intubated per anesthesia plan and Patient placed on Ventilator (see vital sign flow sheet for setting)  Post-op Assessment: Post -op Vital signs reviewed and stable  Post vital signs: stable  Last Vitals:  Vitals Value Taken Time  BP    Temp    Pulse 90 02/17/2018  1:15 PM  Resp 12 02/17/2018  1:15 PM  SpO2 100 % 02/17/2018  1:15 PM  Vitals shown include unvalidated device data.  Last Pain:  Vitals:   02/17/18 0605  TempSrc:   PainSc: 0-No pain         Complications: No apparent anesthesia complications

## 2018-02-17 NOTE — Progress Notes (Signed)
  Echocardiogram Echocardiogram Transesophageal has been performed.  Bobbye Charleston 02/17/2018, 9:38 AM

## 2018-02-17 NOTE — Interval H&P Note (Signed)
History and Physical Interval Note:  02/17/2018 8:24 AM  Luis Chen  has presented today for surgery, with the diagnosis of CAD  The various methods of treatment have been discussed with the patient and family. After consideration of risks, benefits and other options for treatment, the patient has consented to  Procedure(s): CORONARY ARTERY BYPASS GRAFTING (CABG) (N/A) TRANSESOPHAGEAL ECHOCARDIOGRAM (TEE) (N/A) as a surgical intervention .  The patient's history has been reviewed, patient examined, no change in status, stable for surgery.  I have reviewed the patient's chart and labs.  Questions were answered to the patient's satisfaction.     [name]

## 2018-02-17 NOTE — Progress Notes (Signed)
CT surgery p.m. Rounds  Patient is slow to wake up from anesthesia Hemodynamic stable Minimal chest tube drainage Oxygenation satisfactory on FiO2 50%

## 2018-02-17 NOTE — Anesthesia Procedure Notes (Signed)
Procedure Name: Intubation Date/Time: 02/17/2018 7:42 AM Performed by: Lavell Luster, CRNA Pre-anesthesia Checklist: Patient identified, Emergency Drugs available, Suction available, Patient being monitored and Timeout performed Patient Re-evaluated:Patient Re-evaluated prior to induction Oxygen Delivery Method: Circle system utilized Preoxygenation: Pre-oxygenation with 100% oxygen Induction Type: IV induction Ventilation: Mask ventilation without difficulty Laryngoscope Size: Mac and 4 Grade View: Grade I Tube type: Oral Tube size: 7.5 mm Number of attempts: 1 Airway Equipment and Method: Stylet Placement Confirmation: ETT inserted through vocal cords under direct vision,  positive ETCO2 and breath sounds checked- equal and bilateral Secured at: 21 cm Tube secured with: Tape Dental Injury: Teeth and Oropharynx as per pre-operative assessment

## 2018-02-17 NOTE — Op Note (Signed)
CARDIOVASCULAR SURGERY OPERATIVE NOTE  02/17/2018  Surgeon:  [name], MD  First Assistant: Nicholes Rough,  PA-C   Preoperative Diagnosis:  Severe multi-vessel coronary artery disease with severe left ventricular dysfunction   Postoperative Diagnosis:  Same   Procedure:  1. Median Sternotomy 2. Extracorporeal circulation 3.   Coronary artery bypass grafting x 3   Saphenous vein graft to the LAD  Sequential SVG to OM1 and OM2   4.   Endoscopic vein harvest from the right leg   Anesthesia:  General Endotracheal   Clinical History/Surgical Indication:  This 77 year old gentleman presents with a one-month history of epigastric and lower chest discomfort that may be an anginal equivalent with no symptoms of congestive heart failure despite having severe left ventricular systolic dysfunction with ejection fraction of 16% by nuclear stress and 20 to 25% by echocardiogram today. Cardiac catheterization shows severe three-vessel coronary disease with a 90% mid LAD stenosis and occluded right coronary artery with left-to-right collaterals filling a small distal vessel. Despite his low ejection fraction his cardiac index is good and pulmonary artery pressures are only mildly elevated. Left ventricular end-diastolic pressure was also mildly elevated. His symptoms do not necessarily match his degree of left ventricular systolic dysfunction. His ejection fraction in August 2019 was 50 to 55% by echocardiogram. He does have a history of prior nonischemic cardiomyopathy in 2010 when his ejection fraction was noted to be 25% which subsequently improved to 50% a year later. He had no significant coronary disease at that time. It is certainly possible that is present for a left ventricular systolic function is due to a combination of nonischemic and ischemic cardiomyopathy. His degree of global left  ventricular systolic dysfunction appears out of proportion to his degree of coronary disease and even his lateral wall appears severely hypokinetic despite moderate disease in the left circumflex territory.  His LAD looks like a good graftable vessel as does his left circumflex branches. His right coronary is probably graftable although this is a relatively small distal vessel.  His cardiac MRI is suggested there is a significant amount of viable myocardium and coronary revascularization may help him.  I reviewed the study with Dr. Aundra Dubin and we feel that proceeding with coronary bypass graft surgery is the best treatment. I discussed the operative procedure with the patient and and his daughter including alternatives, benefits and risks; including but not limited to bleeding, blood transfusion, infection, stroke, myocardial infarction, graft failure, heart block requiring a permanent pacemaker, organ dysfunction, and death.  Avyaan E Colello understands and agrees to proceed.   Preparation:  The patient was seen in the preoperative holding area and the correct patient, correct operation were confirmed with the patient after reviewing the medical record and catheterization. The consent was signed by me. Preoperative antibiotics were given. A pulmonary arterial line and radial arterial line were placed by the anesthesia team. The patient was taken back to the operating room and positioned supine on the operating room table. After being placed under general endotracheal anesthesia by the anesthesia team a foley catheter was placed. The neck, chest, abdomen, and both legs were prepped with betadine soap and solution and draped in the usual sterile manner. A surgical time-out was taken and the correct patient and operative procedure were confirmed with the nursing and anesthesia staff.  TEE: performed by Dr. Belinda Block. This showed severe LV systolic dysfunction with a dilated, globally hypokinetic LV. There  was trivial AI, mild MR.   Cardiopulmonary Bypass:  A median sternotomy was performed. The pericardium was opened in the midline. Right ventricular function appeared normal. The ascending aorta was of normal size and had no palpable plaque. There were no contraindications to aortic cannulation or cross-clamping. The patient was fully systemically heparinized and the ACT was maintained > 400 sec. The proximal aortic arch was cannulated with a 20 F aortic cannula for arterial inflow. Venous cannulation was performed via the right atrial appendage using a two-staged venous cannula. An antegrade cardioplegia/vent cannula was inserted into the mid-ascending aorta. Aortic occlusion was performed with a single cross-clamp. Systemic cooling to 32 degrees Centigrade and topical cooling of the heart with iced saline were used. Hyperkalemic antegrade cold blood cardioplegia was used to induce diastolic arrest and was then given at about 20 minute intervals throughout the period of arrest to maintain myocardial temperature at or below 10 degrees centigrade. A temperature probe was inserted into the interventricular septum and an insulating pad was placed in the pericardium.   Left internal mammary harvest:  The left side of the sternum was retracted using the Rultract retractor. The left internal mammary artery was harvested as a pedicle graft. All side branches were clipped. It was a small-sized vessel that had slow blood flow and was relatively short before it bifurcated into two tiny branches distally.  It was ligated distally and divided. It was sprayed with topical papaverine solution to prevent vasospasm. After getting cannulated the LIMA was again examined and did not dilate. The blood flow was still marginal and I did not think it was ideal. I divided it proximally to see if it would be suitable for a free graft but the proximal end was still small so I decided not to use it in this patient with a large LAD.  The stump of the LIMA was suture ligated with 2-0 silk.   Endoscopic vein harvest:  The right greater saphenous vein was harvested endoscopically through a 2 cm incision medial to the right knee. It was harvested from the upper thigh to below the knee. It was a medium-sized vein of good quality in the thigh but around the knee level it became small and sclerotic and not suitable. The side branches were all ligated with 4-0 silk ties.    Coronary arteries:  The coronary arteries were examined. The LV was dilated and thinned with patchy scar present on the anterior wall.    LAD:  Large vessel with no distal disease.  LCX:  OM1 and OM2 were both large vessels with no distal disease.  RCA:  Small, non-dominant and diffusely diseased. Not graftable.    Grafts:  1. SVG to the LAD: 2.5 mm. It was sewn end to side using 7-0 prolene continuous suture. 2. Sequential SVG to OM1:  1.75 mm. It was sewn sequential side to side using 7-0 prolene continuous suture. 3. Sequential SVG to OM2:  1.75 mm. It was sewn sequential end to side using 7-0 prolene continuous suture.   The proximal vein graft anastomosis of the OM sequential graft was performed to the mid-ascending aorta using continuous 6-0 prolene suture. The proximal anastomosis of the LAD vein graft was performed side to side to the proximal portion of the LCX vein graft because it was not long enough to reach the aorta. A graft marker was placed around the proximal anastomosis.   Completion:  The patient was rewarmed to 37 degrees Centigrade. The crossclamp was removed with a time of 77 minutes. There was spontaneous return of  sinus rhythm. The distal and proximal anastomoses were checked for hemostasis. The position of the grafts was satisfactory. Two temporary epicardial pacing wires were placed on the right atrium and two on the right ventricle. The patient was weaned from CPB without difficulty on milrinone 0.3 and dopamine 3.  CPB time  was 95 minutes. Cardiac output was 5 LPM. TEE showed some improvement in LV contractility. Heparin was fully reversed with protamine and the aortic and venous cannulas removed. Hemostasis was achieved. Mediastinal and left pleural drainage tubes were placed. The sternum was closed with  #6 stainless steel wires. The fascia was closed with continuous # 1 vicryl suture. The subcutaneous tissue was closed with 2-0 vicryl continuous suture. The skin was closed with 3-0 vicryl subcuticular suture. All sponge, needle, and instrument counts were reported correct at the end of the case. Dry sterile dressings were placed over the incisions and around the chest tubes which were connected to pleurevac suction. The patient was then transported to the surgical intensive care unit in stable condition.

## 2018-02-17 NOTE — Anesthesia Postprocedure Evaluation (Signed)
Anesthesia Post Note  Patient: Luis Chen  Procedure(s) Performed: CORONARY ARTERY BYPASS GRAFTING (CABG) (N/A Chest) TRANSESOPHAGEAL ECHOCARDIOGRAM (TEE) (N/A )     Patient location during evaluation: SICU Anesthesia Type: General Level of consciousness: patient remains intubated per anesthesia plan Pain management: pain level controlled Vital Signs Assessment: post-procedure vital signs reviewed and stable Respiratory status: patient remains intubated per anesthesia plan Cardiovascular status: stable Postop Assessment: no apparent nausea or vomiting Anesthetic complications: no    Last Vitals:  Vitals:   02/17/18 1700 02/17/18 1715  BP: 107/61 104/65  Pulse: 89 89  Resp: 12 12  Temp: 36.5 C 36.5 C  SpO2: 100% 100%    Last Pain:  Vitals:   02/17/18 1400  TempSrc: Core  PainSc:                  Kairah Leoni

## 2018-02-17 NOTE — Progress Notes (Signed)
Bartle MD notified about CXR results. No new orders received.

## 2018-02-18 ENCOUNTER — Encounter (HOSPITAL_COMMUNITY): Payer: Self-pay | Admitting: Surgery

## 2018-02-18 ENCOUNTER — Inpatient Hospital Stay (HOSPITAL_COMMUNITY): Payer: Medicare Other

## 2018-02-18 LAB — POCT I-STAT, CHEM 8
BUN: 17 mg/dL (ref 8–23)
Calcium, Ion: 1.27 mmol/L (ref 1.15–1.40)
Chloride: 107 mmol/L (ref 98–111)
Creatinine, Ser: 1.4 mg/dL — ABNORMAL HIGH (ref 0.61–1.24)
Glucose, Bld: 173 mg/dL — ABNORMAL HIGH (ref 70–99)
HCT: 28 % — ABNORMAL LOW (ref 39.0–52.0)
Hemoglobin: 9.5 g/dL — ABNORMAL LOW (ref 13.0–17.0)
Potassium: 4.9 mmol/L (ref 3.5–5.1)
Sodium: 137 mmol/L (ref 135–145)
TCO2: 21 mmol/L — ABNORMAL LOW (ref 22–32)

## 2018-02-18 LAB — CARBOXYHEMOGLOBIN - COOX: Carboxyhemoglobin: 1.2 % (ref 0.5–1.5)

## 2018-02-18 LAB — POCT I-STAT 3, ART BLOOD GAS (G3+)
Acid-base deficit: 5 mmol/L — ABNORMAL HIGH (ref 0.0–2.0)
Acid-base deficit: 5 mmol/L — ABNORMAL HIGH (ref 0.0–2.0)
BICARBONATE: 20.7 mmol/L (ref 20.0–28.0)
Bicarbonate: 20.8 mmol/L (ref 20.0–28.0)
O2 SAT: 100 %
O2 Saturation: 99 %
PCO2 ART: 40.4 mmHg (ref 32.0–48.0)
Patient temperature: 98.6
Patient temperature: 37.6
TCO2: 22 mmol/L (ref 22–32)
TCO2: 22 mmol/L (ref 22–32)
pCO2 arterial: 41.9 mmHg (ref 32.0–48.0)
pH, Arterial: 7.32 — ABNORMAL LOW (ref 7.350–7.450)
pH, Arterial: 7.304 — ABNORMAL LOW (ref 7.350–7.450)
pO2, Arterial: 139 mmHg — ABNORMAL HIGH (ref 83.0–108.0)
pO2, Arterial: 192 mmHg — ABNORMAL HIGH (ref 83.0–108.0)

## 2018-02-18 LAB — CBC
HCT: 28.2 % — ABNORMAL LOW (ref 39.0–52.0)
HCT: 27.2 % — ABNORMAL LOW (ref 39.0–52.0)
Hemoglobin: 9.2 g/dL — ABNORMAL LOW (ref 13.0–17.0)
Hemoglobin: 8.6 g/dL — ABNORMAL LOW (ref 13.0–17.0)
MCH: 31.3 pg (ref 26.0–34.0)
MCH: 30.9 pg (ref 26.0–34.0)
MCHC: 32.6 g/dL (ref 30.0–36.0)
MCHC: 31.6 g/dL (ref 30.0–36.0)
MCV: 95.9 fL (ref 80.0–100.0)
MCV: 97.8 fL (ref 80.0–100.0)
PLATELETS: 100 10*3/uL — AB (ref 150–400)
Platelets: 112 10*3/uL — ABNORMAL LOW (ref 150–400)
RBC: 2.94 MIL/uL — ABNORMAL LOW (ref 4.22–5.81)
RBC: 2.78 MIL/uL — ABNORMAL LOW (ref 4.22–5.81)
RDW: 13.9 % (ref 11.5–15.5)
RDW: 14.4 % (ref 11.5–15.5)
WBC: 8.3 10*3/uL (ref 4.0–10.5)
WBC: 8.3 10*3/uL (ref 4.0–10.5)
nRBC: 0 % (ref 0.0–0.2)
nRBC: 0 % (ref 0.0–0.2)

## 2018-02-18 LAB — COOXEMETRY PANEL
CARBOXYHEMOGLOBIN: 1 % (ref 0.5–1.5)
Carboxyhemoglobin: 1.1 % (ref 0.5–1.5)
Carboxyhemoglobin: 1.1 % (ref 0.5–1.5)
Methemoglobin: 1.1 % (ref 0.0–1.5)
Methemoglobin: 1.2 % (ref 0.0–1.5)
Methemoglobin: 1.7 % — ABNORMAL HIGH (ref 0.0–1.5)
O2 SAT: 59.3 %
O2 Saturation: 43.1 %
O2 Saturation: 42.1 %
Total hemoglobin: 9.2 g/dL — ABNORMAL LOW (ref 12.0–16.0)
Total hemoglobin: 9.2 g/dL — ABNORMAL LOW (ref 12.0–16.0)
Total hemoglobin: 9.8 g/dL — ABNORMAL LOW (ref 12.0–16.0)

## 2018-02-18 LAB — BASIC METABOLIC PANEL
Anion gap: 6 (ref 5–15)
BUN: 16 mg/dL (ref 8–23)
CALCIUM: 8.4 mg/dL — AB (ref 8.9–10.3)
CO2: 21 mmol/L — ABNORMAL LOW (ref 22–32)
Chloride: 111 mmol/L (ref 98–111)
Creatinine, Ser: 1.44 mg/dL — ABNORMAL HIGH (ref 0.61–1.24)
GFR calc Af Amer: 53 mL/min — ABNORMAL LOW (ref >60)
GFR calc non Af Amer: 46 mL/min — ABNORMAL LOW (ref >60)
Glucose, Bld: 110 mg/dL — ABNORMAL HIGH (ref 70–99)
Potassium: 4.8 mmol/L (ref 3.5–5.1)
Sodium: 138 mmol/L (ref 135–145)

## 2018-02-18 LAB — GLUCOSE, CAPILLARY
Glucose-Capillary: 102 mg/dL — ABNORMAL HIGH (ref 70–99)
Glucose-Capillary: 106 mg/dL — ABNORMAL HIGH (ref 70–99)
Glucose-Capillary: 106 mg/dL — ABNORMAL HIGH (ref 70–99)
Glucose-Capillary: 108 mg/dL — ABNORMAL HIGH (ref 70–99)
Glucose-Capillary: 110 mg/dL — ABNORMAL HIGH (ref 70–99)
Glucose-Capillary: 111 mg/dL — ABNORMAL HIGH (ref 70–99)
Glucose-Capillary: 113 mg/dL — ABNORMAL HIGH (ref 70–99)
Glucose-Capillary: 114 mg/dL — ABNORMAL HIGH (ref 70–99)
Glucose-Capillary: 134 mg/dL — ABNORMAL HIGH (ref 70–99)
Glucose-Capillary: 141 mg/dL — ABNORMAL HIGH (ref 70–99)
Glucose-Capillary: 152 mg/dL — ABNORMAL HIGH (ref 70–99)
Glucose-Capillary: 155 mg/dL — ABNORMAL HIGH (ref 70–99)
Glucose-Capillary: 74 mg/dL (ref 70–99)

## 2018-02-18 LAB — CREATININE, SERUM
Creatinine, Ser: 1.42 mg/dL — ABNORMAL HIGH (ref 0.61–1.24)
GFR calc Af Amer: 54 mL/min — ABNORMAL LOW (ref >60)
GFR, EST NON AFRICAN AMERICAN: 47 mL/min — AB (ref >60)

## 2018-02-18 LAB — MAGNESIUM
Magnesium: 2.8 mg/dL — ABNORMAL HIGH (ref 1.7–2.4)
Magnesium: 2.6 mg/dL — ABNORMAL HIGH (ref 1.7–2.4)

## 2018-02-18 MED ORDER — INSULIN ASPART 100 UNIT/ML ~~LOC~~ SOLN
0.0000 [IU] | SUBCUTANEOUS | Status: DC
  Administered 2018-02-18 (×3): 2 [IU] via SUBCUTANEOUS
  Administered 2018-02-19: 4 [IU] via SUBCUTANEOUS

## 2018-02-18 MED ORDER — ENSURE ENLIVE PO LIQD
237.0000 mL | Freq: Two times a day (BID) | ORAL | Status: DC
  Administered 2018-02-18 – 2018-02-22 (×9): 237 mL via ORAL

## 2018-02-18 MED ORDER — ORAL CARE MOUTH RINSE
15.0000 mL | Freq: Two times a day (BID) | OROMUCOSAL | Status: DC
  Administered 2018-02-18 – 2018-02-21 (×9): 15 mL via OROMUCOSAL

## 2018-02-18 MED ORDER — ENOXAPARIN SODIUM 40 MG/0.4ML ~~LOC~~ SOLN
40.0000 mg | Freq: Every day | SUBCUTANEOUS | Status: DC
  Administered 2018-02-18: 40 mg via SUBCUTANEOUS
  Filled 2018-02-18: qty 0.4

## 2018-02-18 MED ORDER — TRAMADOL HCL 50 MG PO TABS
50.0000 mg | ORAL_TABLET | ORAL | Status: DC | PRN
  Administered 2018-02-20: 50 mg via ORAL
  Filled 2018-02-18: qty 1

## 2018-02-18 MED FILL — Potassium Chloride Inj 2 mEq/ML: INTRAVENOUS | Qty: 40 | Status: AC

## 2018-02-18 MED FILL — Lidocaine HCl(Cardiac) IV PF Soln Pref Syr 100 MG/5ML (2%): INTRAVENOUS | Qty: 5 | Status: AC

## 2018-02-18 MED FILL — Sodium Chloride IV Soln 0.9%: INTRAVENOUS | Qty: 2000 | Status: AC

## 2018-02-18 MED FILL — Thrombin For Soln 5000 Unit: CUTANEOUS | Qty: 3 | Status: AC

## 2018-02-18 MED FILL — Magnesium Sulfate Inj 50%: INTRAMUSCULAR | Qty: 10 | Status: AC

## 2018-02-18 MED FILL — Electrolyte-R (PH 7.4) Solution: INTRAVENOUS | Qty: 3000 | Status: AC

## 2018-02-18 MED FILL — Sodium Bicarbonate IV Soln 8.4%: INTRAVENOUS | Qty: 50 | Status: AC

## 2018-02-18 MED FILL — Mannitol IV Soln 20%: INTRAVENOUS | Qty: 500 | Status: AC

## 2018-02-18 MED FILL — Heparin Sodium (Porcine) Inj 1000 Unit/ML: INTRAMUSCULAR | Qty: 10 | Status: AC

## 2018-02-18 MED FILL — Heparin Sodium (Porcine) Inj 1000 Unit/ML: INTRAMUSCULAR | Qty: 30 | Status: AC

## 2018-02-18 NOTE — Progress Notes (Signed)
1 Day Post-Op Procedure(s) (LRB): CORONARY ARTERY BYPASS GRAFTING (CABG) (N/A) TRANSESOPHAGEAL ECHOCARDIOGRAM (TEE) (N/A) Subjective: No complaints  Objective: Vital signs in last 24 hours: Temp:  [96.1 F (35.6 C)-100 F (37.8 C)] 99 F (37.2 C) (12/13 0700) Pulse Rate:  [82-99] 97 (12/13 0700) Cardiac Rhythm: Normal sinus rhythm (12/13 0400) Resp:  [10-33] 24 (12/13 0700) BP: (93-127)/(50-90) 124/90 (12/13 0700) SpO2:  [98 %-100 %] 100 % (12/13 0700) Arterial Line BP: (101-135)/(39-57) 135/57 (12/13 0700) FiO2 (%):  [40 %-50 %] 40 % (12/13 0030) Weight:  [67 kg] 67 kg (12/13 0438)  Hemodynamic parameters for last 24 hours: PAP: (15-37)/(4-19) 37/13 CO:  [1.7 L/min-4.3 L/min] 4.1 L/min CI:  [1.8 L/min/m2-2.3 L/min/m2] 2.2 L/min/m2  Intake/Output from previous day: 12/12 0701 - 12/13 0700 In: 4761.2 [I.V.:2719.2; Blood:300; IV Piggyback:1742.1] Out: 4098 [Urine:3145; Blood:600; Chest Tube:390] Intake/Output this shift: Total I/O In: 84 [I.V.:84] Out: 70 [Urine:70]  General appearance: alert and cooperative Neurologic: intact Heart: regular rate and rhythm, S1, S2 normal, no murmur, Luis Chen, rub or gallop Lungs: clear to auscultation bilaterally Extremities: extremities normal, atraumatic, no cyanosis or edema Wound: dressings dry  Lab Results: Recent Labs    02/17/18 1854 02/17/18 1900 02/18/18 0324  WBC 6.2  --  8.3  HGB 9.6* 9.9* 9.2*  HCT 30.0* 29.0* 28.2*  PLT 111*  --  112*   BMET:  Recent Labs    02/16/18 1115  02/17/18 1900 02/18/18 0324  NA 138   < > 140 138  K 4.5   < > 4.7 4.8  CL 109   < > 111 111  CO2 18*  --   --  21*  GLUCOSE 102*   < > 118* 110*  BUN 33*   < > 19 16  CREATININE 1.49*   < > 1.30* 1.44*  CALCIUM 9.4  --   --  8.4*   < > = values in this interval not displayed.    PT/INR:  Recent Labs    02/17/18 1316  LABPROT 17.5*  INR 1.45   ABG    Component Value Date/Time   PHART 7.304 (L) 02/18/2018 0219   HCO3 20.7  02/18/2018 0219   TCO2 22 02/18/2018 0219   ACIDBASEDEF 5.0 (H) 02/18/2018 0219   O2SAT 100.0 02/18/2018 0219   CBG (last 3)  Recent Labs    02/18/18 0518 02/18/18 0611 02/18/18 0655  GLUCAP 108* 111* 114*   CXR: mild left base atelectasis  ECG: sinus, lateral T-wave changes, no acute changes.  Assessment/Plan: S/P Procedure(s) (LRB): CORONARY ARTERY BYPASS GRAFTING (CABG) (N/A) TRANSESOPHAGEAL ECHOCARDIOGRAM (TEE) (N/A)  POD 1 Hemodynamically stable in sinus rhythm. Preop EF 25%. Wean off milrinone and dopamine. Resume Coreg and Cozaar as tolerated once stable off inotropes. Followed by Dr. Aundra Dubin.  DC chest tubes, swan, arterial line.  IS, OOB.  DM: preop Hgb A1c was 5.7 on no meds. Glucose under good control. Will continue SSI today and probably can stop tomorrow.   LOS: 1 day    Luis Chen 02/18/2018

## 2018-02-18 NOTE — Progress Notes (Signed)
Initial Nutrition Assessment  DOCUMENTATION CODES:   Non-severe (moderate) malnutrition in context of chronic illness  INTERVENTION:   Ensure Enlive po BID, each supplement provides 350 kcal and 20 grams of protein   NUTRITION DIAGNOSIS:   Moderate Malnutrition related to chronic illness(CHF, CAD, CKD, EF 25%) as evidenced by mild muscle depletion, mild fat depletion.  GOAL:   Patient will meet greater than or equal to 90% of their needs   MONITOR:   PO intake, Supplement acceptance, Diet advancement, Labs, Weight trends  REASON FOR ASSESSMENT:   Malnutrition Screening Tool    ASSESSMENT:   77 yo male admitted with severe three-vessel CAD with severe left ventricular systolic dysfunction who underwent CABG.  PMH includes CM EF 25%, CHF, CKD, HTN, HLD  12/12 Median sternotomy, CABG x 3  CL diet this AM, tolerating liquid diet well. Pt reports appetite good, denies N/V. Pt reports eating well at home PTA. Pt eats 3 meals per day and sometimes snacks on NABs. Breakfast is usually oatmeal or cereal, lunch might be a salad with chicken/turkey or a sandwich and dinner is usually a larger meal with an entree and sides. Pt reports he likes Ensure and drinks it when he can afford to buy it.   Pt reports he has lost weight. Pt reports his UBW is around 160 pounds, trending down to 150 pounds and last weighed 145 pounds.  Weight up post-op; currently 67 kg (147 pounds). Per weight encounters, weight has trended down over the past year  Labs: CBGs 74-114 Meds: Creatinine 1.44  NUTRITION - FOCUSED PHYSICAL EXAM:    Most Recent Value  Orbital Region  Mild depletion  Upper Arm Region  Mild depletion  Thoracic and Lumbar Region  Mild depletion  Buccal Region  Mild depletion  Temple Region  Mild depletion  Clavicle Bone Region  Mild depletion  Clavicle and Acromion Bone Region  Mild depletion  Scapular Bone Region  Moderate depletion  Patellar Region  Mild depletion  Anterior  Thigh Region  Mild depletion  Posterior Calf Region  Mild depletion       Diet Order:   Diet Order            Diet full liquid Room service appropriate? Yes; Fluid consistency: Thin  Diet effective now              EDUCATION NEEDS:   Education needs have been addressed  Skin:  Skin Assessment: Skin Integrity Issues: Skin Integrity Issues:: Incisions Incisions: chest, sternotomy  Last BM:  12/13  Height:   Ht Readings from Last 1 Encounters:  02/17/18 5\' 10"  (1.778 m)    Weight:   Wt Readings from Last 1 Encounters:  02/18/18 67 kg    BMI:  Body mass index is 21.19 kg/m.  Estimated Nutritional Needs:   Kcal:  1900-2100 kcals   Protein:  90-110 g  Fluid:  >/= 1.7 L   Kerman Passey MS, RD, LDN, CNSC [phone] Pager  [phone] Weekend/On-Call Pager

## 2018-02-18 NOTE — Procedures (Signed)
Extubation Procedure Note  Patient Details:   Name: Luis Chen DOB: [birth year] MRN: [id number]   Airway Documentation:  Airway 7.5 mm (Active)  Secured at (cm) 22 cm 02/18/2018 12:09 AM  Measured From Lips 02/18/2018 12:09 AM  Secured Location Right 02/18/2018 12:09 AM  Secured By Pink Tape 02/18/2018 12:09 AM  Cuff Pressure (cm H2O) 24 cm H2O 02/18/2018 12:09 AM  Site Condition Dry 02/17/2018 11:29 PM   Vent end date: 02/18/18 Vent end time: 0110   Evaluation  O2 sats: stable throughout Complications: No apparent complications Patient did tolerate procedure well. Bilateral Breath Sounds: Clear  NIF= -20 Vital Capacity=.700 No stridor heard over upper airways. Placed on 6lpm high flow cannula  Yes  [name] 02/18/2018, 1:12 AM

## 2018-02-18 NOTE — Progress Notes (Addendum)
[facility] BRIEF SICU PROGRESS NOTE  1 Day Post-Op  S/P Procedure(s) (LRB): CORONARY ARTERY BYPASS GRAFTING (CABG) (N/A) TRANSESOPHAGEAL ECHOCARDIOGRAM (TEE) (N/A)   Stable day NSR w/ stable BP Breathing comfortably on room air UOP adequate Labs okay except co-ox down 43%  Plan: recheck co-ox and restart milrinone if it remains low  Rexene Alberts, MD 02/18/2018 6:38 PM

## 2018-02-19 ENCOUNTER — Inpatient Hospital Stay (HOSPITAL_COMMUNITY): Payer: Medicare Other

## 2018-02-19 LAB — CBC
HCT: 25 % — ABNORMAL LOW (ref 39.0–52.0)
Hemoglobin: 7.8 g/dL — ABNORMAL LOW (ref 13.0–17.0)
MCH: 30.5 pg (ref 26.0–34.0)
MCHC: 31.2 g/dL (ref 30.0–36.0)
MCV: 97.7 fL (ref 80.0–100.0)
Platelets: 78 10*3/uL — ABNORMAL LOW (ref 150–400)
RBC: 2.56 MIL/uL — ABNORMAL LOW (ref 4.22–5.81)
RDW: 14.4 % (ref 11.5–15.5)
WBC: 6.9 10*3/uL (ref 4.0–10.5)
nRBC: 0 % (ref 0.0–0.2)

## 2018-02-19 LAB — BASIC METABOLIC PANEL
Anion gap: 7 (ref 5–15)
BUN: 21 mg/dL (ref 8–23)
CHLORIDE: 108 mmol/L (ref 98–111)
CO2: 22 mmol/L (ref 22–32)
Calcium: 8.4 mg/dL — ABNORMAL LOW (ref 8.9–10.3)
Creatinine, Ser: 1.46 mg/dL — ABNORMAL HIGH (ref 0.61–1.24)
GFR calc Af Amer: 52 mL/min — ABNORMAL LOW (ref >60)
GFR calc non Af Amer: 45 mL/min — ABNORMAL LOW (ref >60)
Glucose, Bld: 126 mg/dL — ABNORMAL HIGH (ref 70–99)
Potassium: 4.7 mmol/L (ref 3.5–5.1)
Sodium: 137 mmol/L (ref 135–145)

## 2018-02-19 LAB — GLUCOSE, CAPILLARY
GLUCOSE-CAPILLARY: 178 mg/dL — AB (ref 70–99)
GLUCOSE-CAPILLARY: 128 mg/dL — AB (ref 70–99)
Glucose-Capillary: 117 mg/dL — ABNORMAL HIGH (ref 70–99)
Glucose-Capillary: 107 mg/dL — ABNORMAL HIGH (ref 70–99)
Glucose-Capillary: 102 mg/dL — ABNORMAL HIGH (ref 70–99)

## 2018-02-19 MED ORDER — MOVING RIGHT ALONG BOOK
Freq: Once | Status: AC
  Administered 2018-02-19: 14:00:00
  Filled 2018-02-19: qty 1

## 2018-02-19 MED ORDER — SODIUM CHLORIDE 0.9 % IV SOLN: 250.0000 mL | INTRAVENOUS | Status: DC | PRN

## 2018-02-19 MED ORDER — FUROSEMIDE 10 MG/ML IJ SOLN
40.0000 mg | Freq: Every day | INTRAMUSCULAR | Status: DC
  Administered 2018-02-19: 40 mg via INTRAVENOUS
  Filled 2018-02-19 (×2): qty 4

## 2018-02-19 MED ORDER — POTASSIUM CHLORIDE CRYS ER 20 MEQ PO TBCR
20.0000 meq | EXTENDED_RELEASE_TABLET | Freq: Two times a day (BID) | ORAL | Status: DC
  Administered 2018-02-19 (×2): 20 meq via ORAL
  Filled 2018-02-19 (×3): qty 1

## 2018-02-19 MED ORDER — FUROSEMIDE 10 MG/ML IJ SOLN: 40.0000 mg | Freq: Two times a day (BID) | INTRAMUSCULAR | Status: DC

## 2018-02-19 MED ORDER — SPIRONOLACTONE 12.5 MG HALF TABLET
12.5000 mg | ORAL_TABLET | Freq: Every day | ORAL | Status: DC
  Administered 2018-02-19 – 2018-02-20 (×2): 12.5 mg via ORAL
  Filled 2018-02-19 (×2): qty 1

## 2018-02-19 MED ORDER — SODIUM CHLORIDE 0.9% FLUSH: 3.0000 mL | INTRAVENOUS | Status: DC | PRN

## 2018-02-19 MED ORDER — FERROUS GLUCONATE 324 (38 FE) MG PO TABS
324.0000 mg | ORAL_TABLET | Freq: Two times a day (BID) | ORAL | Status: DC
  Administered 2018-02-19 – 2018-02-22 (×6): 324 mg via ORAL
  Filled 2018-02-19 (×8): qty 1

## 2018-02-19 MED ORDER — INSULIN ASPART 100 UNIT/ML ~~LOC~~ SOLN
0.0000 [IU] | Freq: Three times a day (TID) | SUBCUTANEOUS | Status: DC
  Administered 2018-02-19 – 2018-02-20 (×3): 2 [IU] via SUBCUTANEOUS

## 2018-02-19 MED ORDER — SODIUM CHLORIDE 0.9% FLUSH
3.0000 mL | Freq: Two times a day (BID) | INTRAVENOUS | Status: DC
  Administered 2018-02-21 – 2018-02-22 (×3): 3 mL via INTRAVENOUS

## 2018-02-19 MED ORDER — TRAZODONE HCL 150 MG PO TABS: 75.0000 mg | ORAL_TABLET | Freq: Every evening | ORAL | Status: DC | PRN

## 2018-02-19 MED ORDER — LABETALOL HCL 5 MG/ML IV SOLN: 10.0000 mg | INTRAVENOUS | Status: DC | PRN

## 2018-02-19 MED ORDER — CARVEDILOL 3.125 MG PO TABS
3.1250 mg | ORAL_TABLET | Freq: Two times a day (BID) | ORAL | Status: DC
  Administered 2018-02-19 – 2018-02-22 (×6): 3.125 mg via ORAL
  Filled 2018-02-19 (×6): qty 1

## 2018-02-19 MED ORDER — LOSARTAN POTASSIUM 25 MG PO TABS: 25.0000 mg | ORAL_TABLET | Freq: Every day | ORAL | Status: DC

## 2018-02-19 NOTE — Progress Notes (Addendum)
[address]       [place] [address]             [phone]        CARDIOTHORACIC SURGERY PROGRESS NOTE   R2 Days Post-Op Procedure(s) (LRB): CORONARY ARTERY BYPASS GRAFTING (CABG) (N/A) TRANSESOPHAGEAL ECHOCARDIOGRAM (TEE) (N/A)  Subjective: Looks good and feels well.  Minimal pain.  No SOB.  Ate a full breakfast.  Ambulating well  Objective: Vital signs: BP Readings from Last 1 Encounters:  02/19/18 (!) 112/59   Pulse Readings from Last 1 Encounters:  02/19/18 90   Resp Readings from Last 1 Encounters:  02/19/18 (!) 33   Temp Readings from Last 1 Encounters:  02/18/18 98.3 F (36.8 C) (Oral)    Hemodynamics:    Physical Exam:  Rhythm:   sinus  Breath sounds: clear  Heart sounds:  RRR  Incisions:  Dressing dry, intact  Abdomen:  Soft, non-distended, non-tender  Extremities:  Warm, well-perfused    Intake/Output from previous day: 12/13 0701 - 12/14 0700 In: 941.7 [P.O.:480; I.V.:261.7; IV Piggyback:200] Out: 771 [Urine:751; Chest Tube:20] Intake/Output this shift: Total I/O In: 76.1 [I.V.:76.1] Out: -   Lab Results:  CBC: Recent Labs    02/18/18 1616 02/18/18 1623 02/19/18 0359  WBC 8.3  --  6.9  HGB 8.6* 9.5* 7.8*  HCT 27.2* 28.0* 25.0*  PLT 100*  --  78*    BMET:  Recent Labs    02/18/18 0324  02/18/18 1623 02/19/18 0359  NA 138  --  137 137  K 4.8  --  4.9 4.7  CL 111  --  107 108  CO2 21*  --   --  22  GLUCOSE 110*  --  173* 126*  BUN 16  --  17 21  CREATININE 1.44*   < > 1.40* 1.46*  CALCIUM 8.4*  --   --  8.4*   < > = values in this interval not displayed.     PT/INR:   Recent Labs    02/17/18 1316  LABPROT 17.5*  INR 1.45    CBG (last 3)  Recent Labs    02/18/18 2117 02/19/18 0349 02/19/18 0834  GLUCAP 141* 117* 178*    ABG    Component Value Date/Time   PHART 7.304 (L) 02/18/2018 0219   PCO2ART 41.9 02/18/2018 0219   PO2ART 192.0 (H) 02/18/2018 0219   HCO3 20.7 02/18/2018 0219    TCO2 21 (L) 02/18/2018 1623   ACIDBASEDEF 5.0 (H) 02/18/2018 0219   O2SAT 59.3 02/18/2018 2210    CXR: PORTABLE CHEST 1 VIEW  COMPARISON:  02/18/2018.  FINDINGS: LEFT chest tube and mediastinal tube has been removed. Swan-Ganz catheter removed. Improved LEFT pneumothorax, no longer visible. Cardiac enlargement persists. Small BILATERAL effusions are again noted without significant consolidation or edema.  IMPRESSION: Improved aeration. Improved LEFT pneumothorax.   Electronically Signed   By: Staci Righter M.D.   On: 02/19/2018 09:37  Assessment/Plan: S/P Procedure(s) (LRB): CORONARY ARTERY BYPASS GRAFTING (CABG) (N/A) TRANSESOPHAGEAL ECHOCARDIOGRAM (TEE) (N/A)  Overall doing well POD2 Maintaining NSR w/ stable BP although BP somewhat elevated, still on milrinone 0.25 and Aline in place Breathing comfortably w/ O2 sats 97% on room air, CXR looks good Expected post op acute blood loss anemia, Hgb down slightly 7.8 Acute on chronic combined systolic and diastolic CHF with expected post-op volume excess, weight 3 kg > preop Post op thrombocytopenia, platelet count down 78k Post op elevated serum  creatinine - acute exacerbation of baseline CKD, likely due to prerenal azotemia +/- acute kidney injury caused by ATN, UOP adequate   Mobilize  Diuresis  Wean milrinone off  Restart low dose carvedilol   Watch renal function and restart ARB once creatinine back to baseline  Watch platelet count and stop lovenox  Watch anemia  Transfer 4E   Rexene Alberts, MD 02/19/2018 11:12 AM

## 2018-02-19 NOTE — Progress Notes (Signed)
Patient arrived on unit from [facility].  Patient oriented to unit and to room.  Call bell within reach and bed in lowest position.

## 2018-02-20 ENCOUNTER — Inpatient Hospital Stay (HOSPITAL_COMMUNITY): Payer: Medicare Other

## 2018-02-20 DIAGNOSIS — E440 Moderate protein-calorie malnutrition: Secondary | ICD-10-CM

## 2018-02-20 LAB — BASIC METABOLIC PANEL
Anion gap: 10 (ref 5–15)
BUN: 29 mg/dL — ABNORMAL HIGH (ref 8–23)
CO2: 22 mmol/L (ref 22–32)
CREATININE: 1.55 mg/dL — AB (ref 0.61–1.24)
Calcium: 8.9 mg/dL (ref 8.9–10.3)
Chloride: 107 mmol/L (ref 98–111)
GFR calc non Af Amer: 42 mL/min — ABNORMAL LOW (ref >60)
GFR, EST AFRICAN AMERICAN: 49 mL/min — AB (ref >60)
Glucose, Bld: 112 mg/dL — ABNORMAL HIGH (ref 70–99)
Potassium: 4.8 mmol/L (ref 3.5–5.1)
SODIUM: 139 mmol/L (ref 135–145)

## 2018-02-20 LAB — CBC
HCT: 25.4 % — ABNORMAL LOW (ref 39.0–52.0)
Hemoglobin: 8.5 g/dL — ABNORMAL LOW (ref 13.0–17.0)
MCH: 31.3 pg (ref 26.0–34.0)
MCHC: 33.5 g/dL (ref 30.0–36.0)
MCV: 93.4 fL (ref 80.0–100.0)
Platelets: 103 10*3/uL — ABNORMAL LOW (ref 150–400)
RBC: 2.72 MIL/uL — ABNORMAL LOW (ref 4.22–5.81)
RDW: 13.9 % (ref 11.5–15.5)
WBC: 8.2 10*3/uL (ref 4.0–10.5)
nRBC: 0 % (ref 0.0–0.2)

## 2018-02-20 LAB — GLUCOSE, CAPILLARY
GLUCOSE-CAPILLARY: 101 mg/dL — AB (ref 70–99)
GLUCOSE-CAPILLARY: 126 mg/dL — AB (ref 70–99)

## 2018-02-20 NOTE — Progress Notes (Addendum)
[address]       [place] [address]             [phone]      3 Days Post-Op Procedure(s) (LRB): CORONARY ARTERY BYPASS GRAFTING (CABG) (N/A) TRANSESOPHAGEAL ECHOCARDIOGRAM (TEE) (N/A) Subjective: Feels ok, no pain, no SOB  Objective: Vital signs in last 24 hours: Temp:  [98.5 F (36.9 C)-99.5 F (37.5 C)] 99.1 F (37.3 C) (12/15 0801) Pulse Rate:  [89-103] 91 (12/15 0801) Cardiac Rhythm: Normal sinus rhythm (12/15 0700) Resp:  [21-31] 22 (12/15 0801) BP: (105-146)/(55-80) 115/64 (12/15 0801) SpO2:  [94 %-98 %] 98 % (12/15 0801) Arterial Line BP: (170)/(46) 170/46 (12/14 1100) Weight:  [66.1 kg] 66.1 kg (12/15 0403)  Hemodynamic parameters for last 24 hours:    Intake/Output from previous day: 12/14 0701 - 12/15 0700 In: 442.4 [P.O.:240; I.V.:102.4; IV Piggyback:100] Out: 2750 [Urine:2750] Intake/Output this shift: Total I/O In: 240 [P.O.:240] Out: -   General appearance: alert, cooperative and no distress Heart: regular rate and rhythm Lungs: mildly dim in right base Abdomen: benign Extremities: no edema Wound: incis healing well  Lab Results: Recent Labs    02/19/18 0359 02/20/18 0243  WBC 6.9 8.2  HGB 7.8* 8.5*  HCT 25.0* 25.4*  PLT 78* 103*   BMET:  Recent Labs    02/19/18 0359 02/20/18 0243  NA 137 139  K 4.7 4.8  CL 108 107  CO2 22 22  GLUCOSE 126* 112*  BUN 21 29*  CREATININE 1.46* 1.55*  CALCIUM 8.4* 8.9    PT/INR:  Recent Labs    02/17/18 1316  LABPROT 17.5*  INR 1.45   ABG    Component Value Date/Time   PHART 7.304 (L) 02/18/2018 0219   HCO3 20.7 02/18/2018 0219   TCO2 21 (L) 02/18/2018 1623   ACIDBASEDEF 5.0 (H) 02/18/2018 0219   O2SAT 59.3 02/18/2018 2210   CBG (last 3)  Recent Labs    02/19/18 1617 02/19/18 2045 02/20/18 0605  GLUCAP 102* 128* 101*    Meds Scheduled Meds: . acetaminophen  1,000 mg Oral Q6H  . aspirin EC  325 mg Oral Daily  . bisacodyl  10 mg Oral Daily   Or    . bisacodyl  10 mg Rectal Daily  . carvedilol  3.125 mg Oral BID WC  . docusate sodium  200 mg Oral Daily  . feeding supplement (ENSURE ENLIVE)  237 mL Oral BID BM  . ferrous gluconate  324 mg Oral BID WC  . furosemide  40 mg Intravenous Daily  . insulin aspart  0-24 Units Subcutaneous TID AC & HS  . latanoprost  1 drop Both Eyes QHS  . mouth rinse  15 mL Mouth Rinse BID  . pantoprazole  40 mg Oral Daily  . potassium chloride  20 mEq Oral BID  . rosuvastatin  40 mg Oral q1800  . sodium chloride flush  3 mL Intravenous Q12H  . sodium chloride flush  3 mL Intravenous Q12H  . spironolactone  12.5 mg Oral QHS   Continuous Infusions: . sodium chloride    . sodium chloride    . lactated ringers    . lactated ringers 10 mL/hr at 02/20/18 0825  . milrinone 0.1 mcg/kg/min (02/20/18 0814)   PRN Meds:.sodium chloride, labetalol, morphine injection, ondansetron (ZOFRAN) IV, oxyCODONE, sodium chloride flush, sodium chloride flush, traMADol, traZODone  Xrays Dg Chest 2 View  Result Date: 02/20/2018 CLINICAL DATA:  Atelectasis.  Post CABG  EXAM: CHEST - 2 VIEW COMPARISON:  02/19/2018; 02/17/2018; 02/16/2018 FINDINGS: Grossly unchanged enlarged cardiac silhouette and mediastinal contours post median sternotomy and CABG. Stable position of support apparatus. Improved aeration of the lungs with persistent trace pleural effusions and associated bibasilar opacities, left greater than right. No new focal airspace opacities. Improved conspicuity of the pulmonary vasculature. No pneumothorax. No acute osseous abnormalities. IMPRESSION: 1.  Stable positioning of support apparatus.  No pneumothorax. 2. Improved aeration of lungs suggests resolving edema and atelectasis. 3. Unchanged trace bilateral pleural effusions and associated bibasilar opacities, left greater than right, likely atelectasis. Electronically Signed   By: Sandi Mariscal M.D.   On: 02/20/2018 08:07   Dg Chest Port 1 View  Result Date:  02/19/2018 CLINICAL DATA:  History of CABG. Continued surveillance. EXAM: PORTABLE CHEST 1 VIEW COMPARISON:  02/18/2018. FINDINGS: LEFT chest tube and mediastinal tube has been removed. Swan-Ganz catheter removed. Improved LEFT pneumothorax, no longer visible. Cardiac enlargement persists. Small BILATERAL effusions are again noted without significant consolidation or edema. IMPRESSION: Improved aeration. Improved LEFT pneumothorax. Electronically Signed   By: Staci Righter M.D.   On: 02/19/2018 09:37    Assessment/Plan: S/P Procedure(s) (LRB): CORONARY ARTERY BYPASS GRAFTING (CABG) (N/A) TRANSESOPHAGEAL ECHOCARDIOGRAM (TEE) (N/A)  1 hemodyn stable in sinus rhythm, does not appear to have symptomatic CHF- turn milrinone off 2 sats good on RA 3 anemia is improved  4 thrombocytopenia with improved trend 5 no leukocytosis or fevers 6 good UOP, BUN/creat up a little, weight below preop, I think we can stop lasix for now, cont spironolactone for now but may need to d/c 7 sugars adeq controlled on SSI- A1C 5.7 8 push nutrition, rehab , pulm toilet as able   LOS: 3 days    [facility] 02/20/2018 Pager [phone]  I have seen and examined the patient and agree with the assessment and plan as outlined.  Rexene Alberts, MD 02/20/2018 1:19 PM

## 2018-02-20 NOTE — Progress Notes (Deleted)
Cardiology Office Note   Date:  02/20/2018   ID:  Luis Chen, DOB [birth year], MRN [id number]  PCP:  Leamon Arnt, MD  Cardiologist:   No primary care provider on file. Referring:  Leamon Arnt, MD   No chief complaint on file.     History of Present Illness: Luis Chen is a 77 y.o. male who presents for follow up .  In 2013 he had a previous EF of 25% that improved to 55% on echo in 2013.   When I saw him earlier this year he had chest pain.  In August he had an echo with an EF  That was normal with evidence of posterior wall hypokinesis.   Lexiscan Myoview suggested that the EF was 16% with multiple perfusion defects.   I sent him for cath.  ***    I brought him back to discuss this.   At the last appointment he had had intermittent chest discomfort and was drinking some vinegar to get rid of it.  He did have new T wave inversions on his EKG that I had seen previously in the lateral leads.  He is very vague about his symptoms now but might still get this discomfort.  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  He does not bring on chest discomfort though he might get some dyspnea with this.   Past Medical History:  Diagnosis Date  . CAD (coronary artery disease)    non obstructive. Left main normal. LAd proximal long 25% stenosis, termingating as focal 50% prox to mid lesion. First & second diag were small, normal. Circumflex in proximal av groove had luminal irregularities.. Was large mid obtuse marg which was branching, scattered luminal irregul. Infer branch did supply some septal perforators. Was PDA,small & normal.  < 1.34mm. There was prox 95% stenosis  . Cardiomyopathy    EF was 25% improved to 50%.   . CHF (congestive heart failure) ([place])   . CKD (chronic kidney disease)   . Hyperlipidemia    transient history, controlled on diet  . Hypertension     Past Surgical History:  Procedure Laterality Date  . CORONARY ARTERY BYPASS  GRAFT N/A 02/17/2018   Procedure: CORONARY ARTERY BYPASS GRAFTING (CABG);  Surgeon: [name], MD;  Location: [facility];  Service: Open Heart Surgery;  Laterality: N/A;  Times 3 using endoscopically harvested right saphenous vein.    Marland Kitchen KNEE ARTHROSCOPY Left 07/01/2015   Procedure: Irrigation and debridement left knee arthroscopy;  Surgeon: Renette Butters, MD;  Location: [facility];  Service: Orthopedics;  Laterality: Left;  . RIGHT/LEFT HEART CATH AND CORONARY ANGIOGRAPHY N/A 02/08/2018   Procedure: RIGHT/LEFT HEART CATH AND CORONARY ANGIOGRAPHY;  Surgeon: Jettie Booze, MD;  Location: [facility];  Service: Cardiovascular;  Laterality: N/A;  . TEE WITHOUT CARDIOVERSION N/A 02/17/2018   Procedure: TRANSESOPHAGEAL ECHOCARDIOGRAM (TEE);  Surgeon: [name], MD;  Location: [facility];  Service: Open Heart Surgery;  Laterality: N/A;     No current facility-administered medications for this visit.    No current outpatient medications on file.   Facility-Administered Medications Ordered in Other Visits  Medication Dose Route Frequency Provider Last Rate Last Dose  . 0.9 %  sodium chloride infusion  250 mL Intravenous Continuous Rexene Alberts, MD      . 0.9 %  sodium chloride infusion  250 mL Intravenous PRN Rexene Alberts, MD      .  acetaminophen (TYLENOL) tablet 1,000 mg  1,000 mg Oral Q6H Rexene Alberts, MD   1,000 mg at 02/20/18 1749  . aspirin EC tablet 325 mg  325 mg Oral Daily Rexene Alberts, MD   325 mg at 02/20/18 1055  . bisacodyl (DULCOLAX) EC tablet 10 mg  10 mg Oral Daily Rexene Alberts, MD   10 mg at 02/19/18 4680   Or  . bisacodyl (DULCOLAX) suppository 10 mg  10 mg Rectal Daily Rexene Alberts, MD      . carvedilol (COREG) tablet 3.125 mg  3.125 mg Oral BID WC Rexene Alberts, MD   3.125 mg at 02/20/18 1749  . docusate sodium (COLACE) capsule 200 mg  200 mg Oral Daily Rexene Alberts, MD   200 mg at 02/19/18 0955  . feeding supplement (ENSURE ENLIVE)  (ENSURE ENLIVE) liquid 237 mL  237 mL Oral BID BM Rexene Alberts, MD   237 mL at 02/20/18 1332  . ferrous gluconate (FERGON) tablet 324 mg  324 mg Oral BID WC Rexene Alberts, MD   324 mg at 02/20/18 1749  . insulin aspart (novoLOG) injection 0-24 Units  0-24 Units Subcutaneous TID AC & HS Rexene Alberts, MD   2 Units at 02/20/18 1251  . labetalol (NORMODYNE,TRANDATE) injection 10 mg  10 mg Intravenous Q2H PRN Rexene Alberts, MD      . lactated ringers infusion   Intravenous Continuous Rexene Alberts, MD      . lactated ringers infusion   Intravenous Continuous Rexene Alberts, MD   Stopped at 02/20/18 1750  . latanoprost (XALATAN) 0.005 % ophthalmic solution 1 drop  1 drop Both Eyes QHS Rexene Alberts, MD   1 drop at 02/19/18 2127  . MEDLINE mouth rinse  15 mL Mouth Rinse BID Rexene Alberts, MD   15 mL at 02/20/18 1055  . morphine 2 MG/ML injection 1-4 mg  1-4 mg Intravenous Q1H PRN Rexene Alberts, MD   2 mg at 02/18/18 1111  . ondansetron (ZOFRAN) injection 4 mg  4 mg Intravenous Q6H PRN Rexene Alberts, MD   4 mg at 02/17/18 1316  . oxyCODONE (Oxy IR/ROXICODONE) immediate release tablet 5-10 mg  5-10 mg Oral Q3H PRN Rexene Alberts, MD      . pantoprazole (PROTONIX) EC tablet 40 mg  40 mg Oral Daily Rexene Alberts, MD   40 mg at 02/20/18 1055  . rosuvastatin (CRESTOR) tablet 40 mg  40 mg Oral q1800 Rexene Alberts, MD   40 mg at 02/20/18 1750  . sodium chloride flush (NS) 0.9 % injection 3 mL  3 mL Intravenous Q12H Rexene Alberts, MD   3 mL at 02/19/18 2122  . sodium chloride flush (NS) 0.9 % injection 3 mL  3 mL Intravenous PRN Rexene Alberts, MD      . sodium chloride flush (NS) 0.9 % injection 3 mL  3 mL Intravenous Q12H Rexene Alberts, MD      . sodium chloride flush (NS) 0.9 % injection 3 mL  3 mL Intravenous PRN Rexene Alberts, MD      . spironolactone (ALDACTONE) tablet 12.5 mg  12.5 mg Oral QHS Rexene Alberts, MD   12.5 mg at 02/19/18 2121  . traMADol  (ULTRAM) tablet 50 mg  50 mg Oral Q4H PRN Rexene Alberts, MD   50 mg at 02/20/18 0215  . traZODone (DESYREL) tablet 75 mg  75 mg Oral QHS PRN Rexene Alberts, MD        Allergies:   Ace inhibitors    ROS:     Positive for swelling in his left arm and hand at times.  Positive for anxiety and decreased sleep..   All other systems are reviewed and negative.    PHYSICAL EXAM: VS:  There were no vitals taken for this visit. , BMI There is no height or weight on file to calculate BMI. GENERAL:  Well appearing NECK:  No jugular venous distention, waveform within normal limits, carotid upstroke brisk and symmetric, no bruits, no thyromegaly LUNGS:  Clear to auscultation bilaterally CHEST:  Unremarkable HEART:  PMI not displaced or sustained,S1 and S2 within normal limits, no S3, no S4, no clicks, no rubs, no murmurs ABD:  Flat, positive bowel sounds normal in frequency in pitch, no bruits, no rebound, no guarding, no midline pulsatile mass, no hepatomegaly, no splenomegaly EXT:  2 plus pulses throughout, no edema, no cyanosis no clubbing   EKG:  EKG is not ordered today.    Recent Labs: 01/25/2018: TSH 1.030 02/16/2018: ALT 17 02/18/2018: Magnesium 2.6 02/20/2018: BUN 29; Creatinine, Ser 1.55; Hemoglobin 8.5; Platelets 103; Potassium 4.8; Sodium 139    Lipid Panel    Component Value Date/Time   CHOL 171 02/09/2018 0253   TRIG 64 02/09/2018 0253   HDL 49 02/09/2018 0253   CHOLHDL 3.5 02/09/2018 0253   VLDL 13 02/09/2018 0253   LDLCALC 109 (H) 02/09/2018 0253      Wt Readings from Last 3 Encounters:  02/20/18 145 lb 11.6 oz (66.1 kg)  02/16/18 145 lb 8 oz (66 kg)  02/10/18 140 lb 3.2 oz (63.6 kg)      Other studies Reviewed: Additional studies/ records that were reviewed today include:   I personally reviewed the echo images and reviewed the results of the [place].   Review of the above records demonstrates: septic left knee .    ASSESSMENT AND  PLAN:  CARDIOMYOPATHY:  I reviewed the echo images from August and suggest that they are lower than the 75% reported but certainly better than the 19% reported from [facility].  I personally reviewed the images and thought that there was apical hypokinesis and his ejection fraction was closer to 45%.  Given the symptoms, change in EKG and the markedly abnormal Lexiscan I think cardiac cath is indicated as below.  CAD:   He had a markedly abnormal Lexiscan Myoview.   Cardiac cath is indicated.  The patient understands that risks included but are not limited to stroke (1 in 1000), death (1 in 31), kidney failure [usually temporary] (1 in 500), bleeding (1 in 200), allergic reaction [possibly serious] (1 in 200).  The patient understands and agrees to proceed.    HTN:  Blood pressure is controlled.  No change in therapy pending the results above.    Current medicines are reviewed at length with the patient today.  The patient does not have concerns regarding medicines.  The following changes have been made:   None  Labs/ tests ordered today include:     No orders of the defined types were placed in this encounter.    Disposition:   FU with me after the cath.    Signed, [name], MD  02/20/2018 9:13 PM    [place]

## 2018-02-21 LAB — GLUCOSE, CAPILLARY
GLUCOSE-CAPILLARY: 125 mg/dL — AB (ref 70–99)
Glucose-Capillary: 108 mg/dL — ABNORMAL HIGH (ref 70–99)
Glucose-Capillary: 90 mg/dL (ref 70–99)
Glucose-Capillary: 89 mg/dL (ref 70–99)

## 2018-02-21 LAB — BASIC METABOLIC PANEL
ANION GAP: 11 (ref 5–15)
BUN: 35 mg/dL — ABNORMAL HIGH (ref 8–23)
CALCIUM: 8.9 mg/dL (ref 8.9–10.3)
CO2: 22 mmol/L (ref 22–32)
Chloride: 107 mmol/L (ref 98–111)
Creatinine, Ser: 1.61 mg/dL — ABNORMAL HIGH (ref 0.61–1.24)
GFR calc Af Amer: 46 mL/min — ABNORMAL LOW (ref >60)
GFR, EST NON AFRICAN AMERICAN: 40 mL/min — AB (ref >60)
Glucose, Bld: 134 mg/dL — ABNORMAL HIGH (ref 70–99)
Potassium: 4.9 mmol/L (ref 3.5–5.1)
Sodium: 140 mmol/L (ref 135–145)

## 2018-02-21 MED ORDER — GUAIFENESIN ER 600 MG PO TB12: 600.0000 mg | ORAL_TABLET | Freq: Two times a day (BID) | ORAL | Status: DC | PRN

## 2018-02-21 NOTE — Progress Notes (Signed)
Ambulated almost 1000 ft in hallway using front wheel walker with RN.on room air tolerated well.

## 2018-02-21 NOTE — Progress Notes (Signed)
      [address]       [place] [address]             [phone]        4 Days Post-Op Procedure(s) (LRB): CORONARY ARTERY BYPASS GRAFTING (CABG) (N/A) TRANSESOPHAGEAL ECHOCARDIOGRAM (TEE) (N/A)  Subjective: Patient without specific complaints this am.  Objective: Vital signs in last 24 hours: Temp:  [97.8 F (36.6 C)-98.9 F (37.2 C)] 98.5 F (36.9 C) (12/16 0437) Pulse Rate:  [80-88] 82 (12/16 0835) Cardiac Rhythm: Normal sinus rhythm (12/16 0700) Resp:  [20-26] 20 (12/16 0437) BP: (107-117)/(65-71) 110/65 (12/16 0835) SpO2:  [96 %-98 %] 98 % (12/16 0437) Weight:  [65.8 kg] 65.8 kg (12/16 0448)  Pre op weight 65.7 kg Current Weight  02/21/18 65.8 kg      Intake/Output from previous day: 12/15 0701 - 12/16 0700 In: 480 [P.O.:480] Out: 650 [Urine:650]   Physical Exam:  Cardiovascular: RRR Pulmonary: Clear to auscultation bilaterally Abdomen: Soft, non tender, bowel sounds present. Extremities: No lower extremity edema. Wounds: Clean and dry.  No erythema or signs of infection.  Lab Results: CBC: Recent Labs    02/19/18 0359 02/20/18 0243  WBC 6.9 8.2  HGB 7.8* 8.5*  HCT 25.0* 25.4*  PLT 78* 103*   BMET:  Recent Labs    02/20/18 0243 02/21/18 0219  NA 139 140  K 4.8 4.9  CL 107 107  CO2 22 22  GLUCOSE 112* 134*  BUN 29* 35*  CREATININE 1.55* 1.61*  CALCIUM 8.9 8.9    PT/INR:  Lab Results  Component Value Date   INR 1.45 02/17/2018   INR 1.13 02/16/2018   INR 1.1 ratio (H) 10/31/2008   ABG:  INR: Will add last result for INR, ABG once components are confirmed Will add last 4 CBG results once components are confirmed  Assessment/Plan:  1. CV - SR, first degree heart block this am. On Coreg 3.125 mg bid and Spironolactone 12.5 mg daily 2.  Pulmonary - On room air. Encourage incentive spirometer 3. CKD-creatinine slightly increased to 1.61 this am. Creatinine upon admission 1.49. He is not on Lasix but is on  Spironolactone. Will stop (as K up to 4.9) and re check BMET in am. 4.  Acute blood loss anemia - H and H yesterday 8.5 and 25.4. Continue oral Ferrous 5. CBGs 108/125/90. Pre op HGA1C 5.7. He likely has pre diabetes. Will stop accu checks and SS PRN 6. Remove EPW 7. Possible discharge 1-2 days   M ZimmermanPA-C 02/21/2018,9:33 AM [phone]

## 2018-02-21 NOTE — Progress Notes (Signed)
02/21/2018 12:05 PM EPW D/C'd per order and per protocol.  Ends intact. Pt. Tolerated well.  Advised bedrest x1hr.  Call bell in reach.  Vital signs collected per protocol. [place]

## 2018-02-21 NOTE — Progress Notes (Signed)
CARDIAC REHAB PHASE I   PRE:  Rate/Rhythm: 78 SR    BP: sitting 108/59    SaO2: 98 RA  MODE:  Ambulation: 790 ft   POST:  Rate/Rhythm: 87 SR    BP: sitting 115/67     SaO2: 96 RA  Moving well. Stood and walked independently with RW. Slow, steady pace. No c/o. To recliner. Encouraged x2 more walks. [phone]   [place], ACSM 02/21/2018 11:51 AM

## 2018-02-22 ENCOUNTER — Ambulatory Visit: Payer: Medicare Other | Admitting: Cardiology

## 2018-02-22 LAB — BASIC METABOLIC PANEL
ANION GAP: 10 (ref 5–15)
BUN: 34 mg/dL — ABNORMAL HIGH (ref 8–23)
CO2: 24 mmol/L (ref 22–32)
Calcium: 9.3 mg/dL (ref 8.9–10.3)
Chloride: 105 mmol/L (ref 98–111)
Creatinine, Ser: 1.73 mg/dL — ABNORMAL HIGH (ref 0.61–1.24)
GFR calc Af Amer: 43 mL/min — ABNORMAL LOW (ref >60)
GFR calc non Af Amer: 37 mL/min — ABNORMAL LOW (ref >60)
Glucose, Bld: 113 mg/dL — ABNORMAL HIGH (ref 70–99)
POTASSIUM: 4.7 mmol/L (ref 3.5–5.1)
Sodium: 139 mmol/L (ref 135–145)

## 2018-02-22 MED ORDER — OXYCODONE HCL 5 MG PO TABS: 5.0000 mg | ORAL_TABLET | Freq: Four times a day (QID) | ORAL | 0 refills | Status: DC | PRN

## 2018-02-22 MED ORDER — ACETAMINOPHEN 500 MG PO TABS: 1000.0000 mg | ORAL_TABLET | Freq: Four times a day (QID) | ORAL | 0 refills | Status: DC

## 2018-02-22 MED ORDER — ASPIRIN 325 MG PO TBEC: 325.0000 mg | DELAYED_RELEASE_TABLET | Freq: Every day | ORAL | 0 refills | Status: DC

## 2018-02-22 NOTE — Research (Addendum)
Address abnormal labs: [id number]  MOBILITY:    I HAVE NO PROBLEMS WALKING [x]   I HAVE SLIGHT PROBLEMS WALKING []   I HAVE MODERATE PROBLEMS WALKING []   I HAVE SEVERE PROBLEMS WALKING []   I AM UNABLE TO WALK  []     SELF-CARE:   I HAVE NO PROBLEMS WASING OR DRESSING MYSELF  [x]   I HAVE SLIGHT PROBLEMS WASHING OR DRESSING MYSELF  []   I HAVE MODERATE PROBLEMS WASHING OR DRESSING MYSELF []   I HAVE SEVERE PROBLEMS WASHING OR DRESSING MYSELF  []   I HAVE SEVERE PROBLEMS WASHING OR DRESSING MYSELF  []   I AM UNABLE TO WASH OR DRESS MYSELF []     USUAL ACTIVITIES: (E.G. WORK/STUDY/HOUSEWORK/FAMILY OR LEISURE ACTIVITIES.    I HAVE NO PROBLEMS DOING MY USUAL ACTIVITIES [x]   I HAVE SLIGHT PROBLEMS DOING MY USUAL ACTIVITIES []   I HAVE MODERATE PROBLEMS DOING MY USUAL ACTIVIITIES []   I HAVE SEVERE PROBLEMS DOING MY USUAL ACTIVITIES []   I AM UNABLE TO DO MY USUAL ACTIVITIES []     PAIN /DISCOMFORT   I HAVE NO PAIN OR DISCOMFORT [x]   I HAVE SLIGHT PAIN OR DISCOMFORT []   I HAVE MODERATE PAIN OR DISCOMFORT []   I HAVE SEVERE PAIN OR DISCOMFORT []   I HAVE EXTREME PAIN OR DISCOMFORT []     ANXIETY/DEPRESSION   I AM NOT ANXIOUS OR DEPRESSED []   I AM SLIGHTLY ANXIOUS OR DEPRESSED [x]   I AM MODERATELY ANXIOUS OR DREPRESSED []   I AM SEVERELY ANXIOUS OR DEPRESSED []   I AM EXTREMELY ANXIOUS OR DEPRESSED []     SCALE OF 0-100 HOW WOULD YOU RATE TODAY?  0 IS THE WORSE AND 100 IS THE BEST HEALTH YOU CAN IMAGINE: 90   Research required URINE SAMPLES: Nephrocheck Sample # 1 collected 12/Dec/2019 @ 6759 results 0.10 Sample #2 collected 12/Dec/2019 @ 1925 results 0.08 Patient is in the [place] Study Visit 2 Central labs

## 2018-02-22 NOTE — Progress Notes (Signed)
Pt discharging home with grandson. Discharge instructions reviewed with pt including, medications, follow-up appointments, post-surgical instructions, etc. All questions were answered. IV and telemetry box removed. Pt packed all belongings. Pt to discharge via wheelchair and be transported home by grandson.   Ara Kussmaul BSN, RN

## 2018-02-22 NOTE — Progress Notes (Signed)
      [address]       [place] [address]             [phone]      5 Days Post-Op Procedure(s) (LRB): CORONARY ARTERY BYPASS GRAFTING (CABG) (N/A) TRANSESOPHAGEAL ECHOCARDIOGRAM (TEE) (N/A) Subjective: No issues overnight. Feels good this morning.   Objective: Vital signs in last 24 hours: Temp:  [98.3 F (36.8 C)-98.7 F (37.1 C)] 98.4 F (36.9 C) (12/17 0436) Pulse Rate:  [80-109] 84 (12/17 0436) Cardiac Rhythm: Normal sinus rhythm (12/17 0436) Resp:  [17-29] 20 (12/17 0436) BP: (97-125)/(54-76) 106/56 (12/17 0436) SpO2:  [98 %-100 %] 98 % (12/17 0436) Weight:  [65 kg] 65 kg (12/17 0436)     Intake/Output from previous day: 12/16 0701 - 12/17 0700 In: 120 [P.O.:120] Out: 225 [Urine:225] Intake/Output this shift: No intake/output data recorded.  General appearance: alert, cooperative and no distress Heart: regular rate and rhythm, S1, S2 normal, no murmur, [place], rub or gallop Lungs: clear to auscultation bilaterally Abdomen: soft, non-tender; bowel sounds normal; no masses,  no organomegaly Extremities: extremities normal, atraumatic, no cyanosis or edema Wound: clean and dry  Lab Results: Recent Labs    02/20/18 0243  WBC 8.2  HGB 8.5*  HCT 25.4*  PLT 103*   BMET:  Recent Labs    02/21/18 0219 02/22/18 0404  NA 140 139  K 4.9 4.7  CL 107 105  CO2 22 24  GLUCOSE 134* 113*  BUN 35* 34*  CREATININE 1.61* 1.73*  CALCIUM 8.9 9.3    PT/INR: No results for input(s): LABPROT, INR in the last 72 hours. ABG    Component Value Date/Time   PHART 7.304 (L) 02/18/2018 0219   HCO3 20.7 02/18/2018 0219   TCO2 21 (L) 02/18/2018 1623   ACIDBASEDEF 5.0 (H) 02/18/2018 0219   O2SAT 59.3 02/18/2018 2210   CBG (last 3)  Recent Labs    02/20/18 2135 02/21/18 0621 02/21/18 1215  GLUCAP 125* 90 89    Assessment/Plan: S/P Procedure(s) (LRB): CORONARY ARTERY BYPASS GRAFTING (CABG) (N/A) TRANSESOPHAGEAL ECHOCARDIOGRAM (TEE)  (N/A)  1. CV- NSR in the 80s. BP well controlled. Continue ASA, coreg, and statin. Holding ACEI due to borderline BP.  2. Pulm-tolerating room air with good oxygen saturation 3. Renal-Creatinine 1.73, electrolytes okay 4. H and H 8.5/25.4, platelets 103k 5. Endo-blood glucose level well controlled  Plan: EPW removed yesterday. Feels good this morning. He occasionally has low normal Bps but he is asymptomatic. Only on low-dose coreg. If he becomes symptomatic at home he is to call our office and we can always discontinue.    LOS: 5 days    Luis Chen 02/22/2018

## 2018-02-22 NOTE — Discharge Summary (Signed)
[address]       [place] [address]             [phone]      Physician Discharge Summary  Patient ID: Luis Chen MRN: [id number] DOB/AGE: [birth year] 77 y.o.  Admit date: 02/17/2018 Discharge date: 02/22/2018  Admission Diagnoses: Patient Active Problem List   Diagnosis Date Noted  . Malnutrition of moderate degree 02/20/2018  . CAD (coronary artery disease) 02/08/2018  . Systolic HF (heart failure) ([place]) 02/08/2018  . Angina decubitus ([place])   . Abnormal stress test 01/25/2018  . Nonischemic cardiomyopathy ([place]) 07/03/2015  . Pyogenic arthritis of left knee joint ([place])   . Anemia, chronic disease 07/01/2015  . Primary osteoarthritis of knees, bilateral 12/10/2014  . MGUS (monoclonal gammopathy of unknown significance) 06/30/2010  . Chronic kidney disease (CKD), stage III (moderate) ([place]) 03/24/2010  . Benign essential hypertension 10/31/2008  . Mixed hyperlipidemia 10/30/2008    Discharge Diagnoses:  Active Problems:   S/P CABG x 3   Malnutrition of moderate degree   Discharged Condition: good  HPI:   The patient is a 77 year old gentleman with a history of hypertension, hyperlipidemia, chronic kidney disease, cardiomyopathy and congestive heart failure, and newly diagnosed coronary artery disease who was referred for evaluation for coronary artery bypass graft surgery. The patient has a history of nonischemic cardiomyopathy dating back to 2010 when his ejection fraction was noted to be 25%. He said that at that time he presented with exertional shortness of breath. Cardiac catheterization showed nonobstructive coronary disease. He was treated with medical therapy and his ejection fraction increased to 50% in 2011. He has done well over the years and said that in August 2019 his PCP asked him to see Dr. Percival Spanish for follow-up since he has not seen him in many years. A 2D echocardiogram was done on 10/28/2017 which was read as an ejection  fraction of 55 to 60% although apparently Dr. Percival Spanish thought it was more like 45%. There was hypokinesis of the basal-mid inferior lateral myocardium. There is moderate aortic insufficiency with a pressure half-time of 401 ms. There is trivial mitral regurgitation. Patient said that at that time he was asymptomatic. Then about 1 month ago he developed epigastric and lower chest discomfort that he described as an aching pressure sensation. He had no shortness of breath or orthopnea. His stamina has been good and he has been bowling couple days per week without any difficulty. He underwent a nuclear stress test on 01/19/2018 which showed an ejection fraction of 16% by nuclear stress. There were no ST segment changes during stress. There were multiple defects involving the anterior inferior and septal walls. These findings were consistent with prior myocardial infarction with mild peri-infarct ischemia in the anteroseptal region. He underwent right and left heart cath yesterday which showed severe three-vessel coronary disease. The distal left main had 60% stenosis. The ostial proximal LAD had 70% stenosis. The mid LAD had 90% stenosis and was a large vessel that wraps around the apex. The left circumflex had 50 to 60% stenosis involving the bifurcation into 2 marginal branches. The right coronary artery was occluded proximally with filling of the distal vessel by collaterals which was relatively small. The left ventricular ejection fraction was less than 25% visually. Cardiac index was 2.6. Pulmonary artery pressures were mildly elevated at 37/16 with a mean wedge pressure of 23. LVEDP was 27. Mean right atrial pressure was 4.   The patient was  admitted for cardiac surgery evaluation and consultation with advanced heart failure given his poor ejection fraction.  A repeat echocardiogram on 02/09/2018 showed an ejection fraction of 20 to 25% with severe diffuse hypokinesis.  There is mild aortic insufficiency.  There  is no significant mitral regurgitation.  He subsequently underwent a cardiac MR study to assess viability on 02/10/2018.  This showed a left ventricular ejection fraction of 22% with diffuse hypokinesis with regional variation.  The RV had mildly decreased systolic function with ejection fraction of 42%.  There is evidence of some scar at the apex but otherwise there appeared to be viable muscle.  The patient said that he has felt well except for the epigastric and lower chest discomfort. He continues to deny any shortness of breath or orthopnea. He has had no peripheral edema. His stamina has been good. He bowls several days per week and also works for [employer] part time. He lives with his grandson in [place]. He is a [place] veteran with agent orange exposure and has 70% disability.   Hospital Course:   [name] underwent a CABG x 3 on 02/17/2018 with Dr. Cyndia Bent. He tolerated the procedure well. He tolerated the procedure well and was transferred to the surgical ICU. He was extubated in a timely manner. POD 1 he was hemodynamically stable in sinus rhythm. We weaned off milrinone and dopamine. We resumed coreg and cozaar as tolerated and weaned off inotropes. We discontinued his chest tubes, swan, and arterial line. We encouraged incentive spirometry. POD 2 he was progressing well. He was weaned off oxygen and ambulating around the unit. We continued to wean off milrinone. We restarted low dose coreg. He was stable to transfer to the telemetry unit for continued care. POD 3 he continued to progress. We discontinued his lasix since he was now below his preop weight. His creatinine was up a little bit so we continued to monitor. POD 4 he was in first degree block. He remained on low-dose coreg and spironolactone. His potassium remained a little high so we discontinued his potassium supplementation. He was in normal sinus rhythm, tolerating room air with good oxygen saturation, his incisions were healing  well, and he was ready for discharge.    Consults: None  Significant Diagnostic Studies:   CLINICAL DATA:  Atelectasis.  Post CABG  EXAM: CHEST - 2 VIEW  COMPARISON:  02/19/2018; 02/17/2018; 02/16/2018  FINDINGS: Grossly unchanged enlarged cardiac silhouette and mediastinal contours post median sternotomy and CABG. Stable position of support apparatus. Improved aeration of the lungs with persistent trace pleural effusions and associated bibasilar opacities, left greater than right. No new focal airspace opacities. Improved conspicuity of the pulmonary vasculature. No pneumothorax. No acute osseous abnormalities.  IMPRESSION: 1.  Stable positioning of support apparatus.  No pneumothorax. 2. Improved aeration of lungs suggests resolving edema and atelectasis. 3. Unchanged trace bilateral pleural effusions and associated bibasilar opacities, left greater than right, likely atelectasis.   Electronically Signed   By: Sandi Mariscal M.D.   On: 02/20/2018 08:07  Treatments:  CARDIOVASCULAR SURGERY OPERATIVE NOTE  02/17/2018  Surgeon:  [name], MD  First Assistant: Nicholes Rough,  PA-C   Preoperative Diagnosis:  Severe multi-vessel coronary artery disease with severe left ventricular dysfunction   Postoperative Diagnosis:  Same   Procedure:  1. Median Sternotomy 2. Extracorporeal circulation 3.   Coronary artery bypass grafting x 3   Saphenous vein graft to the LAD  Sequential SVG to OM1 and OM2  4.   Endoscopic vein harvest from the right leg   Anesthesia:  General Endotracheal  Discharge Exam: Blood pressure 115/70, pulse 88, temperature 98.4 F (36.9 C), temperature source Oral, resp. rate 20, height 5\' 10"  (1.778 m), weight 65 kg, SpO2 98 %.  General appearance: alert, cooperative and no distress Heart: regular rate and rhythm, S1, S2 normal, no murmur, Louth, rub or gallop Lungs: clear to auscultation  bilaterally Abdomen: soft, non-tender; bowel sounds normal; no masses,  no organomegaly Extremities: extremities normal, atraumatic, no cyanosis or edema Wound: clean and dry    Disposition: Discharge disposition: 01-Home or Self Care        Allergies as of 02/22/2018      Reactions   Ace Inhibitors Swelling   Facial swelling       Medication List    STOP taking these medications   aspirin 81 MG tablet Replaced by:  aspirin 325 MG EC tablet   losartan 25 MG tablet Commonly known as:  COZAAR   spironolactone 25 MG tablet Commonly known as:  ALDACTONE   trolamine salicylate 10 % cream Commonly known as:  ASPERCREME     TAKE these medications   acetaminophen 500 MG tablet Commonly known as:  TYLENOL Take 2 tablets (1,000 mg total) by mouth every 6 (six) hours.   aspirin 325 MG EC tablet Take 1 tablet (325 mg total) by mouth daily. Replaces:  aspirin 81 MG tablet   carvedilol 3.125 MG tablet Commonly known as:  COREG Take 1 tablet (3.125 mg total) by mouth 2 (two) times daily with a meal.   feeding supplement (ENSURE ENLIVE) Liqd Take 237 mLs by mouth 3 (three) times daily between meals. What changed:    when to take this  reasons to take this   Ferrous Gluconate 324 (37.5 Fe) MG Tabs TAKE 1 TABLET BY MOUTH TWICE DAILY WITH MEALS   furosemide 40 MG tablet Commonly known as:  LASIX Take 0.5 tablets (20 mg total) by mouth daily as needed for fluid or edema (for weight gain of more than 3 Lbs in 1 day).   latanoprost 0.005 % ophthalmic solution Commonly known as:  XALATAN Place 1 drop into both eyes at bedtime.   oxyCODONE 5 MG immediate release tablet Commonly known as:  Oxy IR/ROXICODONE Take 1 tablet (5 mg total) by mouth every 6 (six) hours as needed for severe pain.   rosuvastatin 40 MG tablet Commonly known as:  CRESTOR Take 1 tablet (40 mg total) by mouth daily at 6 PM.   traZODone 50 MG tablet Commonly known as:  DESYREL Take 75 mg by  mouth at bedtime as needed for sleep.      Follow-up Information    Leamon Arnt, MD. Call in 1 day(s).   Specialty:  Family Medicine Contact information: [address] [place] [address] [phone]        [name], MD Follow up.   Specialty:  Cardiothoracic Surgery Why:  Your routine follow-up appointment is on 03/22/2018 at 1:30pm. Please arrive at 1:00pm for a chest xray located at [facility] which is on the first floor of our building Contact information: [place] [address] [phone]        nursing suture removal appointment Follow up.   Why:  Your suture removal appointment is on 12/23 at 10:15am.  Contact information: Dr. Vivi Martens office       [facility] Follow up.   Specialty:  Cardiology Why:  Your follow-up is on 03/11/2018 at 10:30am. Please bring your medication list Contact information: [address] [id number] [place] [phone]         The patient has been discharged on:   1.Beta Blocker:  Yes [ yes  ]                              No   [   ]                              If No, reason:  2.Ace Inhibitor/ARB: Yes [   ]                                     No  [ no   ]                                     If No, reason: low BP and allergy  3.Statin:   Yes [ yes  ]                  No  [   ]                  If No, reason:  4.Ecasa:  Yes  [ yes  ]                  No   [   ]                  If No, reason:   Signed: Elgie Collard 02/22/2018, 9:39 AM

## 2018-02-22 NOTE — Discharge Instructions (Signed)

## 2018-02-22 NOTE — Care Management Important Message (Signed)
Important Message  Patient Details  Name: Luis Chen MRN: [id number] Date of Birth: [birth year]   Medicare Important Message Given:  Yes    Barb Merino Yasamin Karel 02/22/2018, 12:37 PM

## 2018-02-22 NOTE — Care Management Note (Signed)
Case Management Note Marvetta Gibbons RN, BSN Transitions of Care Unit 4E- RN Case Manager [phone]  Patient Details  Name: Luis Chen MRN: [id number] Date of Birth: [birth year]  Subjective/Objective:   Pt admitted s/p CABG x3                 Action/Plan: PTA pt lived at home, plan to return home- no CM needs noted for transition home.   Expected Discharge Date:  02/22/18               Expected Discharge Plan:  Home/Self Care  In-House Referral:  NA  Discharge planning Services  CM Consult  Post Acute Care Choice:  NA Choice offered to:  NA  DME Arranged:  N/A DME Agency:  NA  HH Arranged:    [place] Agency:     Status of Service:  Completed, signed off  If discussed at [place] of Stay Meetings, dates discussed:    Discharge Disposition: home/self care   Additional Comments:  Dawayne Patricia, RN 02/22/2018, 10:36 AM

## 2018-02-22 NOTE — Progress Notes (Signed)
CARDIAC REHAB PHASE I   D/c education completed with pt. Pt instructed to shower and monitor incisions daily. Encouraged continued use of IS, walks, and sternal precautions. Pt given in-the-tube sheet and heart healthy diet. Reviewed restrictions and exercise guidelines. Will refer to CRP II [place]. For d/c today.  [id number] Rufina Falco, RN BSN 02/22/2018 11:05 AM

## 2018-02-23 ENCOUNTER — Telehealth: Payer: Self-pay | Admitting: Emergency Medicine

## 2018-02-23 LAB — BPAM RBC
Blood Product Expiration Date: 201912282359
Blood Product Expiration Date: 201912282359
ISSUE DATE / TIME: 201912120737
ISSUE DATE / TIME: 201912120737
Unit Type and Rh: 6200
Unit Type and Rh: 6200

## 2018-02-23 LAB — TYPE AND SCREEN
ABO/RH(D): A POS
ANTIBODY SCREEN: NEGATIVE
Unit division: 0
Unit division: 0

## 2018-02-23 NOTE — Telephone Encounter (Signed)
Transition Care Management Follow-up Telephone Call   Date discharged? 02/22/2018   How have you been since you were released from the hospital? " I am doing much better"   Do you understand why you were in the hospital? yes, " I had Triple Bypass"    Do you understand the discharge instructions? yes  Where were you discharged to? Home, however," I am going to my daughters house in [place] and spending a few days".  Items Reviewed:  Medications reviewed: yes  Allergies reviewed: yes  Dietary changes reviewed: yes  Referrals reviewed: no   Functional Questionnaire:   Activities of Daily Living (ADLs):   He states they are independent in the following: ambulation, bathing and hygiene, feeding, continence, grooming, toileting and dressing States they require assistance with the following: None   Any transportation issues/concerns?: no   Any patient concerns? no   Confirmed importance and date/time of follow-up visits scheduled yes  Provider Appointment booked with Dr. Jonni Sanger 03/04/2018 at 2:15pm   Confirmed with patient if condition begins to worsen call PCP or go to the ER.  Patient was given the office number and encouraged to call back with question or concerns.  : yes

## 2018-02-24 ENCOUNTER — Encounter: Payer: Medicare Other | Admitting: Family Medicine

## 2018-02-25 ENCOUNTER — Encounter: Payer: Self-pay | Admitting: *Deleted

## 2018-02-25 DIAGNOSIS — Z006 Encounter for examination for normal comparison and control in clinical research program: Secondary | ICD-10-CM

## 2018-02-25 NOTE — Research (Addendum)
[facility] Research Study Screening Visit [facility]:            All referenced abnormal labs are not felt to be clinically significant, with exception of the slightly elevated serum creatinine.  Rexene Alberts, MD 06/13/2018

## 2018-02-25 NOTE — Research (Addendum)
[facility] Research study 7 day follow up completed via phone. Patient doing well and denies any adverse events. No lab work has been collected since 17/Dec/2019.

## 2018-02-28 ENCOUNTER — Ambulatory Visit (INDEPENDENT_AMBULATORY_CARE_PROVIDER_SITE_OTHER): Payer: Self-pay

## 2018-02-28 ENCOUNTER — Other Ambulatory Visit: Payer: Self-pay

## 2018-02-28 ENCOUNTER — Telehealth (HOSPITAL_COMMUNITY): Payer: Self-pay

## 2018-02-28 DIAGNOSIS — Z4802 Encounter for removal of sutures: Secondary | ICD-10-CM

## 2018-02-28 NOTE — Progress Notes (Signed)
Patient arrived for nurse visit to remove 3 sutures post- procedure CABG 02/17/2018 with Dr. Cyndia Bent.  3 Sutures removed with no signs/ symptoms of infection noted.  Incisions looked to be closed and healing nicely.  Patient tolerated procedure well.  Patient instructed to keep the incision sites clean and dry.  Patient acknowledged instructions given.

## 2018-02-28 NOTE — Telephone Encounter (Signed)
Pt insurance is active and benefits verified through Medicare A/B. Co-pay $0.00, DED $185.00/$185.00 met, out of pocket $0.00/$0.00 met, co-insurance 20%. No pre-authorization required. [employer], 02/28/18 @ 11:29AM, REF# [id number]  2ndary insurance is active and benefits verified through [facility]. Co-pay $0.00, DED $0.00/$0.00 met, out of pocket $0.00/$0.00 met, co-insurance 0%. No pre-authorization required. Cheryl/[employer]BS, 02/28/18 @ 11:48AM, REF# [id number]  Will contact patient to see if he is interested in the Cardiac Rehab Program. If interested, patient will need to complete follow up appt. Once completed, patient will be contacted for scheduling upon review by the RN Navigator.

## 2018-02-28 NOTE — Telephone Encounter (Signed)
Attempted to contact pt in regards to CR, unable to leave VM.

## 2018-03-04 ENCOUNTER — Other Ambulatory Visit: Payer: Self-pay

## 2018-03-04 ENCOUNTER — Ambulatory Visit (INDEPENDENT_AMBULATORY_CARE_PROVIDER_SITE_OTHER): Payer: Medicare Other | Admitting: Family Medicine

## 2018-03-04 ENCOUNTER — Encounter: Payer: Self-pay | Admitting: Family Medicine

## 2018-03-04 VITALS — BP 112/52 | HR 86 | Temp 98.8°F | Resp 16 | Ht 70.0 in | Wt 139.2 lb

## 2018-03-04 DIAGNOSIS — I5022 Chronic systolic (congestive) heart failure: Secondary | ICD-10-CM

## 2018-03-04 DIAGNOSIS — D638 Anemia in other chronic diseases classified elsewhere: Secondary | ICD-10-CM

## 2018-03-04 DIAGNOSIS — Z951 Presence of aortocoronary bypass graft: Secondary | ICD-10-CM

## 2018-03-04 DIAGNOSIS — I25118 Atherosclerotic heart disease of native coronary artery with other forms of angina pectoris: Secondary | ICD-10-CM

## 2018-03-04 LAB — CBC WITH DIFFERENTIAL/PLATELET
Basophils Absolute: 0.1 10*3/uL (ref 0.0–0.1)
Basophils Relative: 0.7 % (ref 0.0–3.0)
EOS ABS: 0.5 10*3/uL (ref 0.0–0.7)
Eosinophils Relative: 6.4 % — ABNORMAL HIGH (ref 0.0–5.0)
HEMATOCRIT: 28.5 % — AB (ref 39.0–52.0)
Hemoglobin: 9.7 g/dL — ABNORMAL LOW (ref 13.0–17.0)
Lymphocytes Relative: 29.1 % (ref 12.0–46.0)
Lymphs Abs: 2.1 10*3/uL (ref 0.7–4.0)
MCHC: 33.9 g/dL (ref 30.0–36.0)
MCV: 95.2 fl (ref 78.0–100.0)
Monocytes Absolute: 0.5 10*3/uL (ref 0.1–1.0)
Monocytes Relative: 7.3 % (ref 3.0–12.0)
NEUTROS ABS: 4 10*3/uL (ref 1.4–7.7)
Neutrophils Relative %: 56.5 % (ref 43.0–77.0)
PLATELETS: 533 10*3/uL — AB (ref 150.0–400.0)
RBC: 3 Mil/uL — ABNORMAL LOW (ref 4.22–5.81)
RDW: 13.4 % (ref 11.5–15.5)
WBC: 7.1 10*3/uL (ref 4.0–10.5)

## 2018-03-04 LAB — BASIC METABOLIC PANEL
BUN: 31 mg/dL — ABNORMAL HIGH (ref 6–23)
CO2: 24 mEq/L (ref 19–32)
Calcium: 10.2 mg/dL (ref 8.4–10.5)
Chloride: 105 mEq/L (ref 96–112)
Creatinine, Ser: 1.46 mg/dL (ref 0.40–1.50)
GFR: 59.82 mL/min — ABNORMAL LOW (ref >60.00)
Glucose, Bld: 92 mg/dL (ref 70–99)
Potassium: 4.2 mEq/L (ref 3.5–5.1)
Sodium: 139 mEq/L (ref 135–145)

## 2018-03-04 NOTE — Progress Notes (Signed)
Subjective  CC:  Chief Complaint  Patient presents with  . Hospitalization Follow-up    Triple bypass surgery    HPI: Luis Chen is a 77 y.o. male who presents to the office today to address the problems listed above in the chief complaint. I've personally reviewed recent office visit notes, hospital notes, associated labs and imaging reports and/or pertinent outside office records via chart review or CareEverywhere. Briefly, s/p CABGx3 on 12/12 for 3v CAD with angina; has had stress test, echo's, cardiac cath. Hospital course was reviewed; he tolerated surgery well. Post op course complicated by anemia and mild acute renal failure. Medications were adjusted. bp ran low. Since d/c, he feels well. Wants to be more active. No cp or abdominal pain since surgery.    watching weights at home.   BP Readings from Last 3 Encounters:  03/04/18 (!) 112/52  02/22/18 (!) 111/55  02/16/18 118/69   Wt Readings from Last 3 Encounters:  03/04/18 139 lb 3.2 oz (63.1 kg)  02/22/18 143 lb 4.8 oz (65 kg)  02/16/18 145 lb 8 oz (66 kg)    Assessment  1. S/P CABG x 3   2. Coronary artery disease involving native coronary artery of native heart with other form of angina pectoris ([place])   3. Chronic systolic heart failure ([place])   4. Anemia, chronic disease      Plan   S/p CABG:  Post op course is stable. Feeling well. Has f/u with CV[facility] in 2-3 weeks.   No change in meds at this time.   Check bmp to f/u on creatinine and cbc.   Follow up: No follow-ups on file.  Visit date not found  Orders Placed This Encounter  Procedures  . CBC with Differential/Platelet  . Basic metabolic panel   No orders of the defined types were placed in this encounter.     I reviewed the patients updated PMH, FH, and SocHx.    Patient Active Problem List   Diagnosis Date Noted  . Malnutrition of moderate degree 02/20/2018  . S/P CABG x 3 02/17/2018  . CAD (coronary artery disease) 02/08/2018  .  Systolic HF (heart failure) ([place]) 02/08/2018  . Angina decubitus ([place])   . Abnormal stress test 01/25/2018  . Nonischemic cardiomyopathy ([place]) 07/03/2015  . Pyogenic arthritis of left knee joint ([place])   . Anemia, chronic disease 07/01/2015  . Primary osteoarthritis of knees, bilateral 12/10/2014  . MGUS (monoclonal gammopathy of unknown significance) 06/30/2010  . Chronic kidney disease (CKD), stage III (moderate) ([place]) 03/24/2010  . Benign essential hypertension 10/31/2008  . Mixed hyperlipidemia 10/30/2008   Current Meds  Medication Sig  . acetaminophen (TYLENOL) 500 MG tablet Take 2 tablets (1,000 mg total) by mouth every 6 (six) hours.  Marland Kitchen aspirin EC 325 MG EC tablet Take 1 tablet (325 mg total) by mouth daily.  . carvedilol (COREG) 3.125 MG tablet Take 1 tablet (3.125 mg total) by mouth 2 (two) times daily with a meal.  . feeding supplement, ENSURE ENLIVE, (ENSURE ENLIVE) LIQD Take 237 mLs by mouth 3 (three) times daily between meals. (Patient taking differently: Take 237 mLs by mouth daily as needed (for nutritional support). )  . Ferrous Gluconate 324 (37.5 Fe) MG TABS TAKE 1 TABLET BY MOUTH TWICE DAILY WITH MEALS  . furosemide (LASIX) 40 MG tablet Take 0.5 tablets (20 mg total) by mouth daily as needed for fluid or edema (for weight gain of more than 3 Lbs in 1 day).  Marland Kitchen  latanoprost (XALATAN) 0.005 % ophthalmic solution Place 1 drop into both eyes at bedtime.   . rosuvastatin (CRESTOR) 40 MG tablet Take 1 tablet (40 mg total) by mouth daily at 6 PM.  . traZODone (DESYREL) 50 MG tablet Take 75 mg by mouth at bedtime as needed for sleep.    Allergies: Patient is allergic to ace inhibitors. Family History: Patient family history includes Arthritis in his mother; Diabetes in his mother; Heart attack (age of onset: 52) in his mother; Heart disease in his brother and mother; Hyperlipidemia in his mother; Hypertension in his mother; Hypothyroidism in his daughter; Stroke in his  daughter. Social History:  Patient  reports that he has never smoked. He has never used smokeless tobacco. He reports current alcohol use. He reports that he does not use drugs.  Review of Systems: Constitutional: Negative for fever malaise or anorexia Cardiovascular: negative for chest pain Respiratory: negative for SOB or persistent cough Gastrointestinal: negative for abdominal pain  Objective  Vitals: BP (!) 112/52   Pulse 86   Temp 98.8 F (37.1 C) (Oral)   Resp 16   Ht 5\' 10"  (1.778 m)   Wt 139 lb 3.2 oz (63.1 kg)   SpO2 98%   BMI 19.97 kg/m  General: no acute distress , A&Ox3, looks well HEENT: PEERL, conjunctiva normal, Oropharynx moist,neck is supple Cardiovascular:  RRR without murmur or gallop.  Respiratory:  Good breath sounds bilaterally, CTAB with normal respiratory effort Skin:  Warm, no rashes Ext: tr edema bilaterally  Lab Results  Component Value Date   CREATININE 1.73 (H) 02/22/2018   BUN 34 (H) 02/22/2018   NA 139 02/22/2018   K 4.7 02/22/2018   CL 105 02/22/2018   CO2 24 02/22/2018   Lab Results  Component Value Date   WBC 8.2 02/20/2018   HGB 8.5 (L) 02/20/2018   HCT 25.4 (L) 02/20/2018   MCV 93.4 02/20/2018   PLT 103 (L) 02/20/2018        Commons side effects, risks, benefits, and alternatives for medications and treatment plan prescribed today were discussed, and the patient expressed understanding of the given instructions. Patient is instructed to call or message via MyChart if he/she has any questions or concerns regarding our treatment plan. No barriers to understanding were identified. We discussed Red Flag symptoms and signs in detail. Patient expressed understanding regarding what to do in case of urgent or emergency type symptoms.   Medication list was reconciled, printed and provided to the patient in AVS. Patient instructions and summary information was reviewed with the patient as documented in the AVS. This note was prepared  with assistance of Dragon voice recognition software. Occasional wrong-word or sound-a-like substitutions may have occurred due to the inherent limitations of voice recognition software

## 2018-03-04 NOTE — Patient Instructions (Signed)
Please return in 3 months for recheck.  If you have any questions or concerns, please don't hesitate to send me a message via MyChart or call the office at [phone]. Thank you for visiting with us today! It's our pleasure caring for you.   

## 2018-03-11 ENCOUNTER — Ambulatory Visit (HOSPITAL_COMMUNITY)
Admission: RE | Admit: 2018-03-11 | Discharge: 2018-03-11 | Disposition: A | Discharge status: Home / Self Care | Payer: Medicare Other | Source: Ambulatory Visit | Attending: Cardiology | Admitting: Cardiology

## 2018-03-11 ENCOUNTER — Encounter (HOSPITAL_COMMUNITY): Payer: Self-pay

## 2018-03-11 VITALS — BP 110/68 | HR 94 | Wt 140.8 lb

## 2018-03-11 DIAGNOSIS — I10 Essential (primary) hypertension: Secondary | ICD-10-CM

## 2018-03-11 DIAGNOSIS — Z951 Presence of aortocoronary bypass graft: Secondary | ICD-10-CM | POA: Insufficient documentation

## 2018-03-11 DIAGNOSIS — Z7982 Long term (current) use of aspirin: Secondary | ICD-10-CM | POA: Insufficient documentation

## 2018-03-11 DIAGNOSIS — Z888 Allergy status to other drugs, medicaments and biological substances status: Secondary | ICD-10-CM | POA: Diagnosis not present

## 2018-03-11 DIAGNOSIS — E785 Hyperlipidemia, unspecified: Secondary | ICD-10-CM | POA: Insufficient documentation

## 2018-03-11 DIAGNOSIS — Z79899 Other long term (current) drug therapy: Secondary | ICD-10-CM | POA: Diagnosis not present

## 2018-03-11 DIAGNOSIS — N183 Chronic kidney disease, stage 3 (moderate): Secondary | ICD-10-CM | POA: Diagnosis not present

## 2018-03-11 DIAGNOSIS — I25119 Atherosclerotic heart disease of native coronary artery with unspecified angina pectoris: Secondary | ICD-10-CM | POA: Diagnosis not present

## 2018-03-11 DIAGNOSIS — Z8249 Family history of ischemic heart disease and other diseases of the circulatory system: Secondary | ICD-10-CM | POA: Diagnosis not present

## 2018-03-11 DIAGNOSIS — I5022 Chronic systolic (congestive) heart failure: Secondary | ICD-10-CM

## 2018-03-11 DIAGNOSIS — I2510 Atherosclerotic heart disease of native coronary artery without angina pectoris: Secondary | ICD-10-CM | POA: Diagnosis not present

## 2018-03-11 DIAGNOSIS — I130 Hypertensive heart and chronic kidney disease with heart failure and stage 1 through stage 4 chronic kidney disease, or unspecified chronic kidney disease: Secondary | ICD-10-CM | POA: Diagnosis not present

## 2018-03-11 DIAGNOSIS — Z833 Family history of diabetes mellitus: Secondary | ICD-10-CM | POA: Diagnosis not present

## 2018-03-11 MED ORDER — LOSARTAN POTASSIUM 25 MG PO TABS: 25.0000 mg | ORAL_TABLET | Freq: Every day | ORAL | 3 refills | Status: DC

## 2018-03-11 NOTE — Progress Notes (Signed)
Advanced Heart Failure Clinic Note   Referring Physician: PCP: Leamon Arnt, MD PCP-Cardiologist: No primary care provider on file.   HPI:  Luis Chen is a 78 y.o. male with a history of systolic HF with improved EF from 25% in 2010 -> 50%2011 due to NICM,nonobstructiveCAD, CKD, HL, and HTN.  Went for [facility] 02/08/18 LHC which showed severe multivessel disease with normal output and EF<25%. Meds adjusted as tolerated. Pt requested to go home, and return for CABG the following week.   Pt admitted 02/17/18 for planned CABG. He tolerated the procedure well. First degree block noted POD 4, but remained stable on low-dose coreg. Potassium supp discontinued. Meds adjusted as tolerated. Discharged 02/22/18 in stable condition.   He presents today for post hospital follow up. He is doing very well post op.  He has been up and about. Getting up to 1000 cc on incentive spirometer. Denies CP, has been mildly sore in his legs from vein harvest. Denies SOB, lightheadedness or dizziness. He has been to the store and denies DOE getting around. No difficulties with ADLs. Taking all medications as directed.   cMRI 02/10/18 1. Moderately dilated LV with EF 22%, diffuse hypokinesis with regional variation. 2.  Normal RV size with mildly decreased systolic function, EF 64%. 3. Nonspecific LGE at the anterior RV insertion site, this can be associated with LV volume/pressure overload. Possible coronary pattern LGE at the true apex but subtle and obscured by artifact. There is not significant coronary pattern scarring present on this scan.  Review of systems complete and found to be negative unless listed in HPI.    Past Medical History 1. CAD: Severe 3 vessel disease with occluded RCA with relatively mild anginal symptoms.  - s/p CABG 40/34/74 with uncomplicated post op course.  2. Chronic systolic CHF: Suspected mixed cardiomyopathy.  - Echo 02/2018: EF 20-25%, severe diffuse HK. Cardiac MRI  showed EF 22%, relative preservation of RV function (EF 42%) and minimal LGE (only small area of non-coronary pattern LGE at the anterior RV insertion site). - H/o angioedema with ACEI so not candidate for Entresto.  -Will plan repeat echo 3 months after CABGto determine ICD need (narrow QRS, not CRT candidate). 3. CKD Stage 3  Past Medical History:  Diagnosis Date  . CAD (coronary artery disease)    non obstructive. Left main normal. LAd proximal long 25% stenosis, termingating as focal 50% prox to mid lesion. First & second diag were small, normal. Circumflex in proximal av groove had luminal irregularities.. Was large mid obtuse marg which was branching, scattered luminal irregul. Infer branch did supply some septal perforators. Was PDA,small & normal.  < 1.22mm. There was prox 95% stenosis  . Cardiomyopathy    EF was 25% improved to 50%.   . CHF (congestive heart failure) ([place])   . CKD (chronic kidney disease)   . Hyperlipidemia    transient history, controlled on diet  . Hypertension     Current Outpatient Medications  Medication Sig Dispense Refill  . acetaminophen (TYLENOL) 500 MG tablet Take 2 tablets (1,000 mg total) by mouth every 6 (six) hours. 30 tablet 0  . aspirin EC 325 MG EC tablet Take 1 tablet (325 mg total) by mouth daily. 30 tablet 0  . carvedilol (COREG) 3.125 MG tablet Take 1 tablet (3.125 mg total) by mouth 2 (two) times daily with a meal. 180 tablet 3  . feeding supplement, ENSURE ENLIVE, (ENSURE ENLIVE) LIQD Take 237 mLs by mouth 3 (three)  times daily between meals. (Patient taking differently: Take 237 mLs by mouth daily as needed (for nutritional support). ) 237 mL 12  . Ferrous Gluconate 324 (37.5 Fe) MG TABS TAKE 1 TABLET BY MOUTH TWICE DAILY WITH MEALS 60 tablet 1  . furosemide (LASIX) 40 MG tablet Take 0.5 tablets (20 mg total) by mouth daily as needed for fluid or edema (for weight gain of more than 3 Lbs in 1 day). 30 tablet 0  . latanoprost (XALATAN)  0.005 % ophthalmic solution Place 1 drop into both eyes at bedtime.     . rosuvastatin (CRESTOR) 40 MG tablet Take 1 tablet (40 mg total) by mouth daily at 6 PM. 30 tablet 5  . traZODone (DESYREL) 50 MG tablet Take 75 mg by mouth at bedtime as needed for sleep.     No current facility-administered medications for this visit.     Allergies  Allergen Reactions  . Ace Inhibitors Swelling    Facial swelling       Social History   Socioeconomic History  . Marital status: Widowed    Spouse name: Not on file  . Number of children: Not on file  . Years of education: Not on file  . Highest education level: Not on file  Occupational History  . Not on file  Social Needs  . Financial resource strain: Not on file  . Food insecurity:    Worry: Not on file    Inability: Not on file  . Transportation needs:    Medical: Not on file    Non-medical: Not on file  Tobacco Use  . Smoking status: Never Smoker  . Smokeless tobacco: Never Used  Substance and Sexual Activity  . Alcohol use: Yes    Comment: one glass of wine or beer occassionaly  . Drug use: No  . Sexual activity: Yes  Lifestyle  . Physical activity:    Days per week: 4 days    Minutes per session: 30 min  . Stress: Not on file  Relationships  . Social connections:    Talks on phone: Not on file    Gets together: Not on file    Attends religious service: Not on file    Active member of club or organization: Not on file    Attends meetings of clubs or organizations: Not on file    Relationship status: Not on file  . Intimate partner violence:    Fear of current or ex partner: Not on file    Emotionally abused: Not on file    Physically abused: Not on file    Forced sexual activity: Not on file  Other Topics Concern  . Not on file  Social History Narrative   Lives with grandson (67 yo), wife passed on 2000-06-27. 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       02/11/2018  @ 1352 Pt discharged home with niece. No s/s of distress noted. Education completed, discharge instructions provided and understanding verbalized. Iv sites removed *2. Occlusive drsg per procedure-cdi. No c/o pain/nor distress.      Family History  Problem Relation Age of Onset  . Diabetes Mother   . Arthritis Mother   . Heart disease Mother   . Hyperlipidemia Mother   . Hypertension Mother   . Heart attack Mother 41  . Heart disease Brother        had in his 38s. Had CABG x2  . Hypothyroidism Daughter   .  Stroke Daughter    Vitals:   03/11/18 1037  BP: 110/68  Pulse: 94  SpO2: 100%  Weight: 63.9 kg (140 lb 12.8 oz)    Wt Readings from Last 3 Encounters:  03/11/18 63.9 kg (140 lb 12.8 oz)  03/04/18 63.1 kg (139 lb 3.2 oz)  02/22/18 65 kg (143 lb 4.8 oz)    PHYSICAL EXAM: General:  Well appearing. No respiratory difficulty HEENT: normal Neck: supple. no JVD. Carotids 2+ bilat; no bruits. No lymphadenopathy or thyromegaly appreciated. Cor: PMI nondisplaced. Regular rate & rhythm. No rubs, gallops or murmurs. Sternotomy well approximated.  Lungs: clear Abdomen: soft, nontender, nondistended. No hepatosplenomegaly. No bruits or masses. Good bowel sounds. Extremities: no cyanosis, clubbing, rash, or edema Neuro: alert & oriented x 3, cranial nerves grossly intact. moves all 4 extremities w/o difficulty. Affect pleasant.  ASSESSMENT & PLAN:  1. Chronic systolic CHF: Suspected mixed cardiomyopathy. Patient had EF down to 25% in the past without significant CAD on prior cath, EF then improved over time. Now EF was back down in setting of significant coronary disease, but there may be a nonischemic component as well. He is not a heavy drinker and there is a family history of CAD but not dilated cardiomyopathy. PCWP was mildly elevated on initial RHC, cardiac output was preserved. - Echo 02/2018: EF 20-25%, severe diffuse HK. Cardiac MRI showed EF 22%, relative preservation of  RV function (EF 42%) and minimal LGE (only small area of non-coronary pattern LGE at the anterior RV insertion site). On exam, he is not volume overloaded. Minimal symptoms.  - Will need repeat Echo in March to see if EF improves, if not, will need ICD.  - Volume status stable on exam. He has not needed lasix.  - Add losartan 25 mg daily. H/o angioedema with ACEI so not candidate for Entresto.  - Continue coreg 3.125 mg BID - Reinforced fluid restriction to < 2 L daily, sodium restriction to less than 2000 mg daily, and the importance of daily weights.    2. CAD s/p CABG x 3 - Tolerated well. Has f/u 03/22/17 - Continue ASA and statin.  3. CKD III - Cr back to baseline at 1.46 03/04/18.  Doing very well overall with recent CABG. Meds and labs as above. RTC 6 weeks, then 3 months with Echo. Sooner with symptoms.   If stable after Echo, may be able to graduate back to [employer].   Shirley Friar, PA-C 03/11/18   Greater than 50% of the 25 minute visit was spent in counseling/coordination of care regarding disease state education, salt/fluid restriction, sliding scale diuretics, and medication compliance.

## 2018-03-11 NOTE — Patient Instructions (Addendum)
Please come in for lab work in approximately 10 days, 22 Mar 2018 4:30pm  Begin taking Losartan 25mg  daily.   Follow up with PA Oda Kilts in 6 weeks.  Follow up with DR. McLean in 3 months with an ECHO. You will receive a phone call to schedule this appointment.   Your physician has requested that you have an echocardiogram. Echocardiography is a painless test that uses sound waves to create images of your heart. It provides your doctor with information about the size and shape of your heart and how well your heart's chambers and valves are working. This procedure takes approximately one hour. There are no restrictions for this procedure.

## 2018-03-15 ENCOUNTER — Telehealth (HOSPITAL_COMMUNITY): Payer: Self-pay

## 2018-03-15 ENCOUNTER — Telehealth: Payer: Self-pay | Admitting: *Deleted

## 2018-03-15 ENCOUNTER — Encounter: Payer: Self-pay | Admitting: *Deleted

## 2018-03-15 DIAGNOSIS — Z006 Encounter for examination for normal comparison and control in clinical research program: Secondary | ICD-10-CM

## 2018-03-15 NOTE — Research (Addendum)
[place] study 30 day telephone follow up completed. Patient states he is doing well, He was restarted on Losartan at last Office on 03/11/2018. He Denies any chest pain or any other adverse events.  Last research required phone call will be between 11-May-2018 and 18-May-2018.

## 2018-03-15 NOTE — Telephone Encounter (Signed)
Left message for patient to call research office for his 30 day telephone follow up for the [place] study.

## 2018-03-15 NOTE — Telephone Encounter (Signed)
Attempted to call patient in regards to Cardiac Rehab - LM on VM 

## 2018-03-21 ENCOUNTER — Other Ambulatory Visit: Payer: Self-pay | Admitting: Surgery

## 2018-03-21 DIAGNOSIS — Z951 Presence of aortocoronary bypass graft: Secondary | ICD-10-CM

## 2018-03-21 NOTE — Progress Notes (Unsigned)
cxr 

## 2018-03-22 ENCOUNTER — Ambulatory Visit
Admission: RE | Admit: 2018-03-22 | Discharge: 2018-03-22 | Disposition: A | Discharge status: Home / Self Care | Payer: Medicare Other | Source: Ambulatory Visit | Attending: Surgery | Admitting: Surgery

## 2018-03-22 ENCOUNTER — Ambulatory Visit (INDEPENDENT_AMBULATORY_CARE_PROVIDER_SITE_OTHER): Payer: Self-pay | Admitting: Physician Assistant

## 2018-03-22 ENCOUNTER — Ambulatory Visit (HOSPITAL_COMMUNITY)
Admission: RE | Admit: 2018-03-22 | Discharge: 2018-03-22 | Disposition: A | Discharge status: Home / Self Care | Payer: Medicare Other | Source: Ambulatory Visit | Attending: Cardiology | Admitting: Cardiology

## 2018-03-22 VITALS — BP 95/61 | HR 80 | Resp 20 | Ht 70.0 in | Wt 136.0 lb

## 2018-03-22 DIAGNOSIS — I5022 Chronic systolic (congestive) heart failure: Secondary | ICD-10-CM | POA: Insufficient documentation

## 2018-03-22 DIAGNOSIS — R918 Other nonspecific abnormal finding of lung field: Secondary | ICD-10-CM | POA: Diagnosis not present

## 2018-03-22 DIAGNOSIS — Z951 Presence of aortocoronary bypass graft: Secondary | ICD-10-CM

## 2018-03-22 DIAGNOSIS — I2510 Atherosclerotic heart disease of native coronary artery without angina pectoris: Secondary | ICD-10-CM

## 2018-03-22 LAB — BASIC METABOLIC PANEL
Anion gap: 9 (ref 5–15)
BUN: 32 mg/dL — ABNORMAL HIGH (ref 8–23)
CO2: 24 mmol/L (ref 22–32)
Calcium: 9.7 mg/dL (ref 8.9–10.3)
Chloride: 108 mmol/L (ref 98–111)
Creatinine, Ser: 1.92 mg/dL — ABNORMAL HIGH (ref 0.61–1.24)
GFR calc Af Amer: 38 mL/min — ABNORMAL LOW (ref >60)
GFR, EST NON AFRICAN AMERICAN: 32 mL/min — AB (ref >60)
Glucose, Bld: 99 mg/dL (ref 70–99)
Potassium: 4.5 mmol/L (ref 3.5–5.1)
Sodium: 141 mmol/L (ref 135–145)

## 2018-03-22 NOTE — Progress Notes (Signed)
HPI: [name] is a very pleasant 78 year old male with a history of nonischemic cardiomyopathy who is been treated for heart failure for over 10 years.  Recent cardiac MRI demonstrated an ejection fraction of about 22%.  He was also recently discovered to have coronary artery disease and underwent CABG x3 on 02/17/2018 by Dr. Caffie Pinto.  His postoperative course was uneventful and he was discharged home on the fifth postoperative day.  He was continued on his usual heart failure medication regimen that includes carvedilol, Lasix, and losartan.  He presents for routine scheduled follow-up today and has no new complaints.  He is walking outside on a regular basis when the weather permits and otherwise exercises indoors.  He denies any chest pain or shortness of breath.  He has been seen in follow-up by his primary care physician as well as Mr. Luis July, PA-C of the advanced heart failure service.  Current Outpatient Medications  Medication Sig Dispense Refill  . acetaminophen (TYLENOL) 500 MG tablet Take 2 tablets (1,000 mg total) by mouth every 6 (six) hours. 30 tablet 0  . aspirin EC 325 MG EC tablet Take 1 tablet (325 mg total) by mouth daily. 30 tablet 0  . carvedilol (COREG) 3.125 MG tablet Take 1 tablet (3.125 mg total) by mouth 2 (two) times daily with a meal. 180 tablet 3  . feeding supplement, ENSURE ENLIVE, (ENSURE ENLIVE) LIQD Take 237 mLs by mouth 3 (three) times daily between meals. (Patient taking differently: Take 237 mLs by mouth daily as needed (for nutritional support). ) 237 mL 12  . Ferrous Gluconate 324 (37.5 Fe) MG TABS TAKE 1 TABLET BY MOUTH TWICE DAILY WITH MEALS 60 tablet 1  . furosemide (LASIX) 40 MG tablet Take 0.5 tablets (20 mg total) by mouth daily as needed for fluid or edema (for weight gain of more than 3 Lbs in 1 day). 30 tablet 0  . latanoprost (XALATAN) 0.005 % ophthalmic solution Place 1 drop into both eyes at bedtime.     Marland Kitchen losartan (COZAAR) 25 MG tablet Take 1  tablet (25 mg total) by mouth daily. 60 tablet 3  . rosuvastatin (CRESTOR) 40 MG tablet Take 1 tablet (40 mg total) by mouth daily at 6 PM. 30 tablet 5  . traZODone (DESYREL) 50 MG tablet Take 75 mg by mouth at bedtime as needed for sleep.     No current facility-administered medications for this visit.     Physical Exam: VS:  BP 96/61 Pulse 80 Resp 20 Wt.  136lbs SaO2 50%  General: Luis Chen is accompanied by his daughter today.  He ambulates without difficulty. He is not dyspneic. Heart: Regular rate and rhythm.  No audible murmur. Chest: Breath sounds are clear to auscultation anterior and posterior.  Sternotomy incision is healing with no sign of complication.  Sternum is stable.  Chest tube insertion sites have also healed nicely. Extremities: Right lower leg EVH site is well-healed.  There is no peripheral edema.  Diagnostic Tests:  CHEST - 2 VIEW  COMPARISON:  02/20/2018  FINDINGS: Enlargement of cardiac silhouette post CABG.  Mediastinal contours and pulmonary vascularity normal.  Lungs minimally hyperinflated but clear.  No infiltrate, pleural effusion, or pneumothorax.  Bones unremarkable.  IMPRESSION: Mild enlargement of cardiac silhouette post CABG.  No acute abnormalities.   Electronically Signed   By: Lavonia Dana M.D.   On: 03/22/2018 13:24  Impression / Plan: Excellent progress following CABG x3 on 02/17/2018 for multivessel coronary artery disease.  Medications  are reviewed and require no changes from our standpoint.  Luis Chen has additional follow-up scheduled with the advanced heart failure clinic in 1 month.  Sternal precautions are reviewed.  He is advised that he may resume driving.  He plans to return to work in the first week of March and I have no objections to this plan.   Antony Odea, PA-C [facility] [phone]

## 2018-03-23 ENCOUNTER — Other Ambulatory Visit (HOSPITAL_COMMUNITY): Payer: Self-pay | Admitting: *Deleted

## 2018-03-23 MED ORDER — LOSARTAN POTASSIUM 25 MG PO TABS: 12.5000 mg | ORAL_TABLET | Freq: Every day | ORAL | 3 refills | Status: DC

## 2018-03-28 ENCOUNTER — Encounter (HOSPITAL_COMMUNITY): Payer: Self-pay

## 2018-03-28 ENCOUNTER — Telehealth (HOSPITAL_COMMUNITY): Payer: Self-pay

## 2018-03-28 NOTE — Telephone Encounter (Signed)
Attempted to call pt a 2nd time- LM ON VM ° °Mailed letter out °

## 2018-03-29 ENCOUNTER — Telehealth (HOSPITAL_COMMUNITY): Payer: Self-pay

## 2018-03-29 NOTE — Telephone Encounter (Signed)
Pt returned CR phone call and stated he would like to participate in our CR program. Patient will come in for orientation on 04/26/2018 @ 745AM and will attend the 815AM exercise class. Went over insurance, patient verbalized understanding.   Mailed homework package.

## 2018-04-19 ENCOUNTER — Telehealth: Payer: Self-pay | Admitting: Family Medicine

## 2018-04-19 NOTE — Telephone Encounter (Signed)
Pt is aware.  

## 2018-04-19 NOTE — Telephone Encounter (Signed)
No, he can use his clearance from his CVTS surgeon and cardiologist. I am glad he is doing so well.

## 2018-04-19 NOTE — Telephone Encounter (Signed)
Copied from [place] [id number]. Topic: Quick Communication - See Telephone Encounter >> Apr 19, 2018  2:16 PM Rutherford Nail, [place] wrote: CRM for notification. See Telephone encounter for: 04/19/18. Patient calling and would like to know if Dr Jonni Sanger needs to release him to go back to working part time after having triple bypass? States that he is doing well and there were no complications during surgery or after. States that he is healing fine and will start voluntary rehab on 05/04/2018. States that his surgeon had release him to go back to work part time. Will also be calling his cardiologist to check also. CB#: [phone]

## 2018-04-19 NOTE — Telephone Encounter (Signed)
Please advise 

## 2018-04-20 ENCOUNTER — Telehealth (HOSPITAL_COMMUNITY): Payer: Self-pay

## 2018-04-22 ENCOUNTER — Encounter (HOSPITAL_COMMUNITY): Payer: Self-pay | Admitting: *Deleted

## 2018-04-22 ENCOUNTER — Encounter (HOSPITAL_COMMUNITY): Payer: Self-pay

## 2018-04-22 ENCOUNTER — Other Ambulatory Visit: Payer: Self-pay

## 2018-04-22 ENCOUNTER — Ambulatory Visit (HOSPITAL_COMMUNITY)
Admission: RE | Admit: 2018-04-22 | Discharge: 2018-04-22 | Disposition: A | Discharge status: Home / Self Care | Payer: Medicare Other | Source: Ambulatory Visit | Attending: Internal Medicine | Admitting: Internal Medicine

## 2018-04-22 ENCOUNTER — Telehealth (HOSPITAL_COMMUNITY): Payer: Self-pay

## 2018-04-22 VITALS — BP 90/56 | HR 86 | Wt 137.1 lb

## 2018-04-22 DIAGNOSIS — N179 Acute kidney failure, unspecified: Secondary | ICD-10-CM | POA: Diagnosis not present

## 2018-04-22 DIAGNOSIS — Z951 Presence of aortocoronary bypass graft: Secondary | ICD-10-CM | POA: Insufficient documentation

## 2018-04-22 DIAGNOSIS — I130 Hypertensive heart and chronic kidney disease with heart failure and stage 1 through stage 4 chronic kidney disease, or unspecified chronic kidney disease: Secondary | ICD-10-CM | POA: Insufficient documentation

## 2018-04-22 DIAGNOSIS — I25119 Atherosclerotic heart disease of native coronary artery with unspecified angina pectoris: Secondary | ICD-10-CM

## 2018-04-22 DIAGNOSIS — I5022 Chronic systolic (congestive) heart failure: Secondary | ICD-10-CM | POA: Diagnosis not present

## 2018-04-22 DIAGNOSIS — Z7901 Long term (current) use of anticoagulants: Secondary | ICD-10-CM | POA: Diagnosis not present

## 2018-04-22 DIAGNOSIS — I10 Essential (primary) hypertension: Secondary | ICD-10-CM | POA: Diagnosis not present

## 2018-04-22 DIAGNOSIS — Z79899 Other long term (current) drug therapy: Secondary | ICD-10-CM | POA: Insufficient documentation

## 2018-04-22 DIAGNOSIS — Z8249 Family history of ischemic heart disease and other diseases of the circulatory system: Secondary | ICD-10-CM | POA: Diagnosis not present

## 2018-04-22 DIAGNOSIS — N183 Chronic kidney disease, stage 3 unspecified: Secondary | ICD-10-CM

## 2018-04-22 DIAGNOSIS — R42 Dizziness and giddiness: Secondary | ICD-10-CM | POA: Diagnosis not present

## 2018-04-22 DIAGNOSIS — Z7982 Long term (current) use of aspirin: Secondary | ICD-10-CM | POA: Insufficient documentation

## 2018-04-22 DIAGNOSIS — I2510 Atherosclerotic heart disease of native coronary artery without angina pectoris: Secondary | ICD-10-CM | POA: Insufficient documentation

## 2018-04-22 LAB — CBC
HCT: 29.5 % — ABNORMAL LOW (ref 39.0–52.0)
Hemoglobin: 9.5 g/dL — ABNORMAL LOW (ref 13.0–17.0)
MCH: 30.2 pg (ref 26.0–34.0)
MCHC: 32.2 g/dL (ref 30.0–36.0)
MCV: 93.7 fL (ref 80.0–100.0)
Platelets: 185 10*3/uL (ref 150–400)
RBC: 3.15 MIL/uL — ABNORMAL LOW (ref 4.22–5.81)
RDW: 14.2 % (ref 11.5–15.5)
WBC: 5.8 10*3/uL (ref 4.0–10.5)
nRBC: 0 % (ref 0.0–0.2)

## 2018-04-22 LAB — BASIC METABOLIC PANEL
Anion gap: 10 (ref 5–15)
BUN: 42 mg/dL — ABNORMAL HIGH (ref 8–23)
CO2: 24 mmol/L (ref 22–32)
Calcium: 9.8 mg/dL (ref 8.9–10.3)
Chloride: 103 mmol/L (ref 98–111)
Creatinine, Ser: 2.31 mg/dL — ABNORMAL HIGH (ref 0.61–1.24)
GFR calc Af Amer: 30 mL/min — ABNORMAL LOW (ref >60)
GFR calc non Af Amer: 26 mL/min — ABNORMAL LOW (ref >60)
GLUCOSE: 97 mg/dL (ref 70–99)
Potassium: 4.3 mmol/L (ref 3.5–5.1)
Sodium: 137 mmol/L (ref 135–145)

## 2018-04-22 NOTE — Progress Notes (Signed)
Luis Chen 78 y.o. male DOB [birth year] MRN [id number]       Nutrition Screen Note  No diagnosis found. Past Medical History:  Diagnosis Date  . CAD (coronary artery disease)    non obstructive. Left main normal. LAd proximal long 25% stenosis, termingating as focal 50% prox to mid lesion. First & second diag were small, normal. Circumflex in proximal av groove had luminal irregularities.. Was large mid obtuse marg which was branching, scattered luminal irregul. Infer branch did supply some septal perforators. Was PDA,small & normal.  < 1.85mm. There was prox 95% stenosis  . Cardiomyopathy    EF was 25% improved to 50%.   . CHF (congestive heart failure) ([place])   . CKD (chronic kidney disease)   . Hyperlipidemia    transient history, controlled on diet  . Hypertension    Meds reviewed.     Current Outpatient Medications (Cardiovascular):  .  carvedilol (COREG) 3.125 MG tablet, Take 1 tablet (3.125 mg total) by mouth 2 (two) times daily with a meal. .  furosemide (LASIX) 40 MG tablet, Take 0.5 tablets (20 mg total) by mouth daily as needed for fluid or edema (for weight gain of more than 3 Lbs in 1 day). .  rosuvastatin (CRESTOR) 40 MG tablet, Take 1 tablet (40 mg total) by mouth daily at 6 PM.   Current Outpatient Medications (Analgesics):  .  acetaminophen (TYLENOL) 500 MG tablet, Take 2 tablets (1,000 mg total) by mouth every 6 (six) hours. Marland Kitchen  aspirin EC 325 MG EC tablet, Take 1 tablet (325 mg total) by mouth daily.  Current Outpatient Medications (Hematological):  Marland Kitchen  Ferrous Gluconate 324 (37.5 Fe) MG TABS, TAKE 1 TABLET BY MOUTH TWICE DAILY WITH MEALS  Current Outpatient Medications (Other):  .  feeding supplement, ENSURE ENLIVE, (ENSURE ENLIVE) LIQD, Take 237 mLs by mouth 3 (three) times daily between meals. .  latanoprost (XALATAN) 0.005 % ophthalmic solution, Place 1 drop into both eyes at bedtime.  .  traZODone (DESYREL) 50 MG tablet, Take 75 mg by mouth at bedtime as  needed for sleep.   HT: Ht Readings from Last 1 Encounters:  03/22/18 5\' 10"  (1.778 m)    WT: Wt Readings from Last 13 Encounters:  04/22/18 137 lb 2 oz (62.2 kg)  03/22/18 136 lb (61.7 kg)  03/11/18 140 lb 12.8 oz (63.9 kg)  03/04/18 139 lb 3.2 oz (63.1 kg)  02/22/18 143 lb 4.8 oz (65 kg)  02/16/18 145 lb 8 oz (66 kg)  02/10/18 140 lb 3.2 oz (63.6 kg)  01/25/18 152 lb (68.9 kg)  01/19/18 155 lb (70.3 kg)  10/18/17 155 lb 12.8 oz (70.7 kg)  08/25/17 160 lb 6.4 oz (72.8 kg)  08/25/17 160 lb 4 oz (72.7 kg)  07/02/17 163 lb 1.6 oz (74 kg)     Note: Pt has lost 26 lbs in 10 months  BMI = 19.51  03/12/18  Current tobacco use? No       Labs:  Lipid Panel     Component Value Date/Time   CHOL 171 02/09/2018 0253   TRIG 64 02/09/2018 0253   HDL 49 02/09/2018 0253   CHOLHDL 3.5 02/09/2018 0253   VLDL 13 02/09/2018 0253   LDLCALC 109 (H) 02/09/2018 0253    Lab Results  Component Value Date   HGBA1C 5.7 (H) 02/16/2018   CBG (last 3)  No results for input(s): GLUCAP in the last 72 hours.    Nutrition Diagnosis ? Food-and nutrition-related  knowledge deficit related to lack of exposure to information as related to diagnosis of: ? CVD ? CHF ? Unintentional weight loss related to inadequate energy intake as evidenced by a BMI of 19.5, and weight loss of 26 lbs in the last 10 months  Nutrition Goal(s):  ? To be determined  Plan:  Pt to attend nutrition classes ? Nutrition I ? Nutrition II ? Portion Distortion  Will provide client-centered nutrition education as part of interdisciplinary care.   Monitor and evaluate progress toward nutrition goal with team.  Laurina Bustle, MS, RD, LDN 04/22/2018 1:29 PM

## 2018-04-22 NOTE — Patient Instructions (Signed)
Routine lab work today. Will notify you of abnormal results  Follow up and echo in 6-8 weeks with Dr.Bensimhon

## 2018-04-22 NOTE — Telephone Encounter (Signed)
Spoke to pt. Pt aware and agreeable to med changes. Lab appointment made. Pt verbalized understanding.

## 2018-04-22 NOTE — Progress Notes (Signed)
Advanced Heart Failure Clinic Note   Referring Physician: PCP: Leamon Arnt, MD PCP-Cardiologist: No primary care provider on file.   HPI:  Luis Chen is a 78 y.o. male with a history of systolic HF with improved EF from 25% in 2010 -> 50%2011 due to NICM,nonobstructiveCAD, CKD, HL, and HTN.  Went for [facility] 02/08/18 LHC which showed severe multivessel disease with normal output and EF<25%. Meds adjusted as tolerated. Pt requested to go home, and return for CABG the following week.   Pt admitted 02/17/18 for planned CABG. He tolerated the procedure well. First degree block noted POD 4, but remained stable on low-dose coreg. Potassium supp discontinued. Meds adjusted as tolerated. Discharged 02/22/18 in stable condition.   He presents today for regular follow up. Last visit started Losartan, then reduced dose due to AKI. Feeling well overall. He walks daily around the block or in the house depending on the weather. He denies any undue SOB. Appetite stable. Occasional lightheadedness if he stands too quickly, but not marked or limiting. Denies CP, dizziness, or edema. Has not needed Lasix. Has plans to start cardiac rehab on 2/26, after orientation 2/18, and is interested in returning to his part-time job at [employer] (Mostly walking and moving cars. No heavy lifting). Taking all medications as prescribed.   cMRI 02/10/18  1. Moderately dilated LV with EF 22%, diffuse hypokinesis with regional variation. 2.  Normal RV size with mildly decreased systolic function, EF 11%. 3. Nonspecific LGE at the anterior RV insertion site, this can be associated with LV volume/pressure overload. Possible coronary pattern LGE at the true apex but subtle and obscured by artifact. There is not significant coronary pattern scarring present on this scan.  Review of systems complete and found to be negative unless listed in HPI.    Past Medical History 1. CAD: Severe 3 vessel disease with occluded RCA  with relatively mild anginal symptoms.  - s/p CABG 94/17/40 with uncomplicated post op course.  2. Chronic systolic CHF: Suspected mixed cardiomyopathy.  - Echo 02/2018: EF 20-25%, severe diffuse HK. Cardiac MRI showed EF 22%, relative preservation of RV function (EF 42%) and minimal LGE (only small area of non-coronary pattern LGE at the anterior RV insertion site). - H/o angioedema with ACEI so not candidate for Entresto.  -Will plan repeat echo 3 months after CABGto determine ICD need (narrow QRS, not CRT candidate). 3. CKD Stage 3  Past Medical History:  Diagnosis Date  . CAD (coronary artery disease)    non obstructive. Left main normal. LAd proximal long 25% stenosis, termingating as focal 50% prox to mid lesion. First & second diag were small, normal. Circumflex in proximal av groove had luminal irregularities.. Was large mid obtuse marg which was branching, scattered luminal irregul. Infer branch did supply some septal perforators. Was PDA,small & normal.  < 1.45mm. There was prox 95% stenosis  . Cardiomyopathy    EF was 25% improved to 50%.   . CHF (congestive heart failure) ([place])   . CKD (chronic kidney disease)   . Hyperlipidemia    transient history, controlled on diet  . Hypertension     Current Outpatient Medications  Medication Sig Dispense Refill  . acetaminophen (TYLENOL) 500 MG tablet Take 2 tablets (1,000 mg total) by mouth every 6 (six) hours. 30 tablet 0  . aspirin EC 325 MG EC tablet Take 1 tablet (325 mg total) by mouth daily. 30 tablet 0  . carvedilol (COREG) 3.125 MG tablet Take 1 tablet (  3.125 mg total) by mouth 2 (two) times daily with a meal. 180 tablet 3  . feeding supplement, ENSURE ENLIVE, (ENSURE ENLIVE) LIQD Take 237 mLs by mouth 3 (three) times daily between meals. 237 mL 12  . Ferrous Gluconate 324 (37.5 Fe) MG TABS TAKE 1 TABLET BY MOUTH TWICE DAILY WITH MEALS 60 tablet 1  . furosemide (LASIX) 40 MG tablet Take 0.5 tablets (20 mg total) by mouth  daily as needed for fluid or edema (for weight gain of more than 3 Lbs in 1 day). 30 tablet 0  . latanoprost (XALATAN) 0.005 % ophthalmic solution Place 1 drop into both eyes at bedtime.     Marland Kitchen losartan (COZAAR) 25 MG tablet Take 0.5 tablets (12.5 mg total) by mouth daily. 45 tablet 3  . rosuvastatin (CRESTOR) 40 MG tablet Take 1 tablet (40 mg total) by mouth daily at 6 PM. 30 tablet 5  . traZODone (DESYREL) 50 MG tablet Take 75 mg by mouth at bedtime as needed for sleep.     No current facility-administered medications for this encounter.     Allergies  Allergen Reactions  . Ace Inhibitors Swelling    Facial swelling       Social History   Socioeconomic History  . Marital status: Widowed    Spouse name: Not on file  . Number of children: Not on file  . Years of education: Not on file  . Highest education level: Not on file  Occupational History  . Not on file  Social Needs  . Financial resource strain: Not on file  . Food insecurity:    Worry: Not on file    Inability: Not on file  . Transportation needs:    Medical: Not on file    Non-medical: Not on file  Tobacco Use  . Smoking status: Never Smoker  . Smokeless tobacco: Never Used  Substance and Sexual Activity  . Alcohol use: Yes    Comment: one glass of wine or beer occassionaly  . Drug use: No  . Sexual activity: Yes  Lifestyle  . Physical activity:    Days per week: 4 days    Minutes per session: 30 min  . Stress: Not on file  Relationships  . Social connections:    Talks on phone: Not on file    Gets together: Not on file    Attends religious service: Not on file    Active member of club or organization: Not on file    Attends meetings of clubs or organizations: Not on file    Relationship status: Not on file  . Intimate partner violence:    Fear of current or ex partner: Not on file    Emotionally abused: Not on file    Physically abused: Not on file    Forced sexual activity: Not on file  Other  Topics Concern  . Not on file  Social History Narrative   Lives with grandson (56 yo), wife passed on Jun 09, 2000. 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       02/11/2018 @ 1352 Pt discharged home with niece. No s/s of distress noted. Education completed, discharge instructions provided and understanding verbalized. Iv sites removed *2. Occlusive drsg per procedure-cdi. No c/o pain/nor distress.    Family History  Problem Relation Age of Onset  . Diabetes Mother   . Arthritis Mother   . Heart disease Mother   . Hyperlipidemia Mother   .  Hypertension Mother   . Heart attack Mother 35  . Heart disease Brother        had in his 76s. Had CABG x2  . Hypothyroidism Daughter   . Stroke Daughter    Vitals:   04/22/18 0914  BP: (!) 90/56  Pulse: 86  SpO2: 99%  Weight: 62.2 kg   Wt Readings from Last 3 Encounters:  04/22/18 62.2 kg  03/22/18 61.7 kg  03/11/18 63.9 kg    PHYSICAL EXAM: General: Thin-appearing elderly male. NAD HEENT: Normal Neck: Supple. No JVD. Carotids 2+ bilat; no bruits. No lymphadenopathy or thyromegaly appreciated. Cor: PMI nondisplaced. RRR, no M/G/R.  Lungs: CTAB, normal effort.  Abdomen: soft, nontender, nondistended. No hepatosplenomegaly. No bruits or masses. BS present Extremities: No edema. No clubbing or cyanosis. Neuro: Alert & oriented x 3, cranial nerves grossly intact. moves all 4 extremities w/o difficulty.  Psych: Mood appropriate. Affect pleasant.  ASSESSMENT & PLAN:  1. Chronic systolic CHF: Suspected mixed cardiomyopathy. Patient had EF down to 25% in the past without significant CAD on prior cath, EF then improved over time. Now EF was back down in setting of significant coronary disease, but there may be a nonischemic component as well. He is not a heavy drinker and there is a family history of CAD but not dilated cardiomyopathy. PCWP was mildly elevated on initial RHC, cardiac output was  preserved. - Echo 02/2018: EF 20-25%, severe diffuse HK. Cardiac MRI showed EF 22%, relative preservation of RV function (EF 42%) and minimal LGE (only small area of non-coronary pattern LGE at the anterior RV insertion site). Will plan repeat Echo 6-8 weeks. If EF not improved, will need ICD.  - NYHA II-III symptoms.  - Volume status stable on exam.   - Continue Losartan 12.5 mg daily. AKI with higher dose. BMET today. ( H/o angioedema with ACEI so not candidate for Entresto.) - Continue coreg 3.125 mg BID - No room for medication titration at this time given kidney function and BP.  - Reinforced fluid restriction to < 2 L daily, sodium restriction to less than 2000 mg daily, and the importance of daily weights.   - Patient scheduled to begin cardiac rehab on the 05/04/18.   2. CAD s/p CABG x 3 - No s/s of ischemia.    - Continue ASA and statin.  3. AKI on CKD III - On Losartan. Dose cut back.  - BMET today.  BMET and CBC today. Follow up in 6-8 weeks with repeat ECHO (3 month post-op) and see Dr. Haroldine Laws.   Darla Lesches, Student-PA 04/22/18   Patient seen and examined with the above-signed Student. I personally reviewed laboratory data, imaging studies and relevant notes. I independently examined the patient and formulated the important aspects of the plan. I have edited the note to reflect any of my changes or salient points. I have personally discussed the plan with the patient and/or family.   Feeling good overall. Labs as above. No room to adjust meds. RTC 6-8 weeks with Echo and MD visit.   Legrand Como [address] [place], PA-C 04/22/2018 10:08 AM

## 2018-04-26 ENCOUNTER — Encounter (HOSPITAL_COMMUNITY): Payer: Self-pay

## 2018-04-26 ENCOUNTER — Encounter (HOSPITAL_COMMUNITY)
Admission: RE | Admit: 2018-04-26 | Discharge: 2018-04-26 | Disposition: A | Discharge status: Home / Self Care | Payer: Medicare Other | Source: Ambulatory Visit | Attending: Cardiology | Admitting: Cardiology

## 2018-04-26 VITALS — Ht 68.0 in | Wt 134.7 lb

## 2018-04-26 DIAGNOSIS — Z951 Presence of aortocoronary bypass graft: Secondary | ICD-10-CM | POA: Insufficient documentation

## 2018-04-26 NOTE — Progress Notes (Signed)
Cardiac Individual Treatment Plan  Patient Details  Name: Luis Chen MRN: [id number] Date of Birth: 01/21/1941 Referring Provider:   Flowsheet Row CARDIAC REHAB PHASE II ORIENTATION from 04/26/2018 in [place]  Referring Provider  Dr. Percival Spanish      Initial Encounter Date:  Flowsheet Row CARDIAC REHAB PHASE II ORIENTATION from 04/26/2018 in [place]  Date  04/26/18      Visit Diagnosis: 02/17/18 CABG x3  Patient's Home Medications on Admission:  Current Outpatient Medications:  .  acetaminophen (TYLENOL) 500 MG tablet, Take 2 tablets (1,000 mg total) by mouth every 6 (six) hours. (Patient taking differently: Take 1,000 mg by mouth every 6 (six) hours as needed. ), Disp: 30 tablet, Rfl: 0 .  aspirin EC 325 MG EC tablet, Take 1 tablet (325 mg total) by mouth daily., Disp: 30 tablet, Rfl: 0 .  carvedilol (COREG) 3.125 MG tablet, Take 1 tablet (3.125 mg total) by mouth 2 (two) times daily with a meal., Disp: 180 tablet, Rfl: 3 .  feeding supplement, ENSURE ENLIVE, (ENSURE ENLIVE) LIQD, Take 237 mLs by mouth 3 (three) times daily between meals., Disp: 237 mL, Rfl: 12 .  Ferrous Gluconate 324 (37.5 Fe) MG TABS, TAKE 1 TABLET BY MOUTH TWICE DAILY WITH MEALS, Disp: 60 tablet, Rfl: 1 .  latanoprost (XALATAN) 0.005 % ophthalmic solution, Place 1 drop into both eyes at bedtime. , Disp: , Rfl:  .  rosuvastatin (CRESTOR) 40 MG tablet, Take 1 tablet (40 mg total) by mouth daily at 6 PM., Disp: 30 tablet, Rfl: 5 .  traZODone (DESYREL) 50 MG tablet, Take 75 mg by mouth at bedtime as needed for sleep., Disp: , Rfl:  .  furosemide (LASIX) 40 MG tablet, Take 0.5 tablets (20 mg total) by mouth daily as needed for fluid or edema (for weight gain of more than 3 Lbs in 1 day). (Patient not taking: Reported on 04/26/2018), Disp: 30 tablet, Rfl: 0  Past Medical History: Past Medical History:  Diagnosis Date  . CAD (coronary artery disease)     non obstructive. Left main normal. LAd proximal long 25% stenosis, termingating as focal 50% prox to mid lesion. First & second diag were small, normal. Circumflex in proximal av groove had luminal irregularities.. Was large mid obtuse marg which was branching, scattered luminal irregul. Infer branch did supply some septal perforators. Was PDA,small & normal.  < 1.96mm. There was prox 95% stenosis  . Cardiomyopathy    EF was 25% improved to 50%.   . CHF (congestive heart failure) ([place]   . CKD (chronic kidney disease)   . Hyperlipidemia    transient history, controlled on diet  . Hypertension     Tobacco Use: Social History   Tobacco Use  Smoking Status Never Smoker  Smokeless Tobacco Never Used    Labs: Recent Review Flowsheet Data    Labs for ITP Cardiac and Pulmonary Rehab Latest Ref Rng & Units 02/18/2018 02/18/2018 02/18/2018 02/18/2018 02/18/2018   Cholestrol 0 - 200 mg/dL - - - - -   LDLCALC 0 - 99 mg/dL - - - - -   HDL >40 mg/dL - - - - -   Trlycerides <150 mg/dL - - - - -   Hemoglobin A1c 4.8 - 5.6 % - - - - -   PHART 7.350 - 7.450 7.304(L) - - - -   PCO2ART 32.0 - 48.0 mmHg 41.9 - - - -   HCO3 20.0 -  28.0 mmol/L 20.7 - - - -   TCO2 22 - 32 mmol/L 22 21(L) - - -   ACIDBASEDEF 0.0 - 2.0 mmol/L 5.0(H) - - - -   O2SAT % 100.0 - 43.1 42.1 59.3      Capillary Blood Glucose: Lab Results  Component Value Date   GLUCAP 89 02/21/2018   GLUCAP 90 02/21/2018   GLUCAP 125 (H) 02/20/2018   GLUCAP 108 (H) 02/20/2018   GLUCAP 126 (H) 02/20/2018     Exercise Target Goals: Exercise Program Goal: Individual exercise prescription set using results from initial 6 min walk test and THRR while considering  patient's activity barriers and safety.   Exercise Prescription Goal: Initial exercise prescription builds to 30-45 minutes a day of aerobic activity, 2-3 days per week.  Home exercise guidelines will be given to patient during program as part of exercise prescription that  the participant will acknowledge.  Activity Barriers & Risk Stratification: Activity Barriers & Cardiac Risk Stratification - 04/26/18 1056    Activity Barriers & Cardiac Risk Stratification          Activity Barriers  Muscular Weakness;Deconditioning;Other (comment)    Comments  Right calf pain    Cardiac Risk Stratification  High           6 Minute Walk: 6 Minute Walk    6 Minute Walk    Row Name 04/26/18 1038   Phase  Initial   Distance  1401 feet   Walk Time  6 minutes   # of Rest Breaks  0   MPH  2.6   METS  2.9   RPE  11   VO2 Peak  10.25   Symptoms  Yes (comment)   Comments  Right calf pain   Resting HR  89 bpm   Resting BP  98/60   Resting Oxygen Saturation   99 %   Exercise Oxygen Saturation  during 6 min walk  97 %   Max Ex. HR  106 bpm   Max Ex. BP  112/78   2 Minute Post BP  104/60          Oxygen Initial Assessment:   Oxygen Re-Evaluation:   Oxygen Discharge (Final Oxygen Re-Evaluation):   Initial Exercise Prescription: Initial Exercise Prescription - 04/26/18 1000    Date of Initial Exercise RX and Referring Provider          Date  04/26/18    Referring Provider  Dr. Percival Spanish    Expected Discharge Date  07/29/18        Treadmill          MPH  2.5    Grade  0    Minutes  10        Recumbant Bike          Level  2    Watts  15    Minutes  10    METs  2.79        NuStep          Level  3    SPM  75    Minutes  10    METs  2.8        Prescription Details          Frequency (times per week)  3    Duration  Progress to 30 minutes of continuous aerobic without signs/symptoms of physical distress        Intensity          THRR  40-80% of Max Heartrate  59-113    Ratings of Perceived Exertion  11-13        Progression          Progression  Continue to progress workloads to maintain intensity without signs/symptoms of physical distress.        Resistance Training          Training Prescription  Yes    Weight  3  lbs.     Reps  10-15           Perform Capillary Blood Glucose checks as needed.  Exercise Prescription Changes:   Exercise Comments:   Exercise Goals and Review: Exercise Goals    Exercise Goals    Row Name 04/26/18 1111   Increase Physical Activity  Yes   Intervention  Provide advice, education, support and counseling about physical activity/exercise needs.;Develop an individualized exercise prescription for aerobic and resistive training based on initial evaluation findings, risk stratification, comorbidities and participant's personal goals.   Expected Outcomes  Short Term: Attend rehab on a regular basis to increase amount of physical activity.   Increase Strength and Stamina  Yes   Intervention  Provide advice, education, support and counseling about physical activity/exercise needs.;Develop an individualized exercise prescription for aerobic and resistive training based on initial evaluation findings, risk stratification, comorbidities and participant's personal goals.   Expected Outcomes  Short Term: Increase workloads from initial exercise prescription for resistance, speed, and METs.   Able to understand and use rate of perceived exertion (RPE) scale  Yes   Intervention  Provide education and explanation on how to use RPE scale   Expected Outcomes  Short Term: Able to use RPE daily in rehab to express subjective intensity level;Long Term:  Able to use RPE to guide intensity level when exercising independently   Knowledge and understanding of Target Heart Rate Range (THRR)  Yes   Intervention  Provide education and explanation of THRR including how the numbers were predicted and where they are located for reference   Expected Outcomes  Short Term: Able to state/look up THRR;Long Term: Able to use THRR to govern intensity when exercising independently;Short Term: Able to use daily as guideline for intensity in rehab   Able to check pulse independently  Yes   Intervention   Provide education and demonstration on how to check pulse in carotid and radial arteries.;Review the importance of being able to check your own pulse for safety during independent exercise   Expected Outcomes  Short Term: Able to explain why pulse checking is important during independent exercise;Long Term: Able to check pulse independently and accurately   Understanding of Exercise Prescription  Yes   Intervention  Provide education, explanation, and written materials on patient's individual exercise prescription   Expected Outcomes  Short Term: Able to explain program exercise prescription;Long Term: Able to explain home exercise prescription to exercise independently          Exercise Goals Re-Evaluation :   Discharge Exercise Prescription (Final Exercise Prescription Changes):   Nutrition:  Target Goals: Understanding of nutrition guidelines, daily intake of sodium 1500mg , cholesterol 200mg , calories 30% from fat and 7% or less from saturated fats, daily to have 5 or more servings of fruits and vegetables.  Biometrics: Pre Biometrics - 04/26/18 1112    Pre Biometrics          Height  5\' 8"  (1.727 m)    Weight  61.1 kg    Waist Circumference  32.5 inches  Hip Circumference  38.25 inches    Waist to Hip Ratio  0.85 %    BMI (Calculated)  20.49    Triceps Skinfold  10 mm    % Body Fat  20.2 %    Grip Strength  42 kg    Flexibility  9 in    Single Leg Stand  20 seconds            Nutrition Therapy Plan and Nutrition Goals:   Nutrition Assessments:   Nutrition Goals Re-Evaluation:   Nutrition Goals Re-Evaluation:   Nutrition Goals Discharge (Final Nutrition Goals Re-Evaluation):   Psychosocial: Target Goals: Acknowledge presence or absence of significant depression and/or stress, maximize coping skills, provide positive support system. Participant is able to verbalize types and ability to use techniques and skills needed for reducing stress and  depression.  Initial Review & Psychosocial Screening: Initial Psych Review & Screening - 04/26/18 1133    Initial Review          Current issues with  None Identified        Family Dynamics          Good Support System?  Yes        Barriers          Psychosocial barriers to participate in program  There are no identifiable barriers or psychosocial needs.        Screening Interventions          Interventions  Encouraged to exercise           Quality of Life Scores: Quality of Life - 04/26/18 0943    Quality of Life          Select  Quality of Life        Quality of Life Scores          Health/Function Pre  25.9 %    Socioeconomic Pre  24.75 %    Psych/Spiritual Pre  24.86 %    Family Pre  24 %    GLOBAL Pre  25.16 %          Scores of 19 and below usually indicate a poorer quality of life in these areas.  A difference of  2-3 points is a clinically meaningful difference.  A difference of 2-3 points in the total score of the Quality of Life Index has been associated with significant improvement in overall quality of life, self-image, physical symptoms, and general health in studies assessing change in quality of life.  PHQ-9: Recent Review Flowsheet Data    Depression screen [facility] 2/9 08/25/2017 02/23/2017   Decreased Interest 0 0   Down, Depressed, Hopeless 0 1   PHQ - 2 Score 0 1     Interpretation of Total Score  Total Score Depression Severity:  1-4 = Minimal depression, 5-9 = Mild depression, 10-14 = Moderate depression, 15-19 = Moderately severe depression, 20-27 = Severe depression   Psychosocial Evaluation and Intervention:   Psychosocial Re-Evaluation:   Psychosocial Discharge (Final Psychosocial Re-Evaluation):   Vocational Rehabilitation: Provide vocational rehab assistance to qualifying candidates.   Vocational Rehab Evaluation & Intervention: Vocational Rehab - 04/26/18 1133    Initial Vocational Rehab Evaluation & Intervention           Assessment shows need for Vocational Rehabilitation  No   works PT at [employer]          Education: Education Goals: Education classes will be provided on a weekly basis, covering required topics.  Participant will state understanding/return demonstration of topics presented.  Learning Barriers/Preferences: Learning Barriers/Preferences - 04/26/18 1112    Learning Barriers/Preferences          Learning Barriers  Sight    Learning Preferences  Audio;Individual Instruction;Video           Education Topics: Count Your Pulse:  -Group instruction provided by verbal instruction, demonstration, patient participation and written materials to support subject.  Instructors address importance of being able to find your pulse and how to count your pulse when at home without a heart monitor.  Patients get hands on experience counting their pulse with staff help and individually.   Heart Attack, Angina, and Risk Factor Modification:  -Group instruction provided by verbal instruction, video, and written materials to support subject.  Instructors address signs and symptoms of angina and heart attacks.    Also discuss risk factors for heart disease and how to make changes to improve heart health risk factors.   Functional Fitness:  -Group instruction provided by verbal instruction, demonstration, patient participation, and written materials to support subject.  Instructors address safety measures for doing things around the house.  Discuss how to get up and down off the floor, how to pick things up properly, how to safely get out of a chair without assistance, and balance training.   Meditation and Mindfulness:  -Group instruction provided by verbal instruction, patient participation, and written materials to support subject.  Instructor addresses importance of mindfulness and meditation practice to help reduce stress and improve awareness.  Instructor also leads participants through  a meditation exercise.    Stretching for Flexibility and Mobility:  -Group instruction provided by verbal instruction, patient participation, and written materials to support subject.  Instructors lead participants through series of stretches that are designed to increase flexibility thus improving mobility.  These stretches are additional exercise for major muscle groups that are typically performed during regular warm up and cool down.   Hands Only CPR:  -Group verbal, video, and participation provides a basic overview of AHA guidelines for community CPR. Role-play of emergencies allow participants the opportunity to practice calling for help and chest compression technique with discussion of AED use.   Hypertension: -Group verbal and written instruction that provides a basic overview of hypertension including the most recent diagnostic guidelines, risk factor reduction with self-care instructions and medication management.    Nutrition I class: Heart Healthy Eating:  -Group instruction provided by PowerPoint slides, verbal discussion, and written materials to support subject matter. The instructor gives an explanation and review of the Therapeutic Lifestyle Changes diet recommendations, which includes a discussion on lipid goals, dietary fat, sodium, fiber, plant stanol/sterol esters, sugar, and the components of a well-balanced, healthy diet.   Nutrition II class: Lifestyle Skills:  -Group instruction provided by PowerPoint slides, verbal discussion, and written materials to support subject matter. The instructor gives an explanation and review of label reading, grocery shopping for heart health, heart healthy recipe modifications, and ways to make healthier choices when eating out.   Diabetes Question & Answer:  -Group instruction provided by PowerPoint slides, verbal discussion, and written materials to support subject matter. The instructor gives an explanation and review of diabetes  co-morbidities, pre- and post-prandial blood glucose goals, pre-exercise blood glucose goals, signs, symptoms, and treatment of hypoglycemia and hyperglycemia, and foot care basics.   Diabetes Blitz:  -Group instruction provided by PowerPoint slides, verbal discussion, and written materials to support subject matter. The instructor gives an explanation and  review of the physiology behind type 1 and type 2 diabetes, diabetes medications and rational behind using different medications, pre- and post-prandial blood glucose recommendations and Hemoglobin A1c goals, diabetes diet, and exercise including blood glucose guidelines for exercising safely.    Portion Distortion:  -Group instruction provided by PowerPoint slides, verbal discussion, written materials, and food models to support subject matter. The instructor gives an explanation of serving size versus portion size, changes in portions sizes over the last 20 years, and what consists of a serving from each food group.   Stress Management:  -Group instruction provided by verbal instruction, video, and written materials to support subject matter.  Instructors review role of stress in heart disease and how to cope with stress positively.     Exercising on Your Own:  -Group instruction provided by verbal instruction, power point, and written materials to support subject.  Instructors discuss benefits of exercise, components of exercise, frequency and intensity of exercise, and end points for exercise.  Also discuss use of nitroglycerin and activating EMS.  Review options of places to exercise outside of rehab.  Review guidelines for sex with heart disease.   Cardiac Drugs I:  -Group instruction provided by verbal instruction and written materials to support subject.  Instructor reviews cardiac drug classes: antiplatelets, anticoagulants, beta blockers, and statins.  Instructor discusses reasons, side effects, and lifestyle considerations for each  drug class.   Cardiac Drugs II:  -Group instruction provided by verbal instruction and written materials to support subject.  Instructor reviews cardiac drug classes: angiotensin converting enzyme inhibitors (ACE-I), angiotensin II receptor blockers (ARBs), nitrates, and calcium channel blockers.  Instructor discusses reasons, side effects, and lifestyle considerations for each drug class.   Anatomy and Physiology of the Circulatory System:  Group verbal and written instruction and models provide basic cardiac anatomy and physiology, with the coronary electrical and arterial systems. Review of: AMI, Angina, Valve disease, Heart Failure, Peripheral Artery Disease, Cardiac Arrhythmia, Pacemakers, and the ICD.   Other Education:  -Group or individual verbal, written, or video instructions that support the educational goals of the cardiac rehab program.   Holiday Eating Survival Tips:  -Group instruction provided by PowerPoint slides, verbal discussion, and written materials to support subject matter. The instructor gives patients tips, tricks, and techniques to help them not only survive but enjoy the holidays despite the onslaught of food that accompanies the holidays.   Knowledge Questionnaire Score: Knowledge Questionnaire Score - 04/26/18 0943    Knowledge Questionnaire Score          Pre Score  19/24           Core Components/Risk Factors/Patient Goals at Admission: Personal Goals and Risk Factors at Admission - 04/26/18 1113    Core Components/Risk Factors/Patient Goals on Admission           Weight Management  Yes;Weight Maintenance    Intervention  Weight Management: Develop a combined nutrition and exercise program designed to reach desired caloric intake, while maintaining appropriate intake of nutrient and fiber, sodium and fats, and appropriate energy expenditure required for the weight goal.;Weight Management: Provide education and appropriate resources to help participant  work on and attain dietary goals.    Admit Weight  134 lb 11.2 oz (61.1 kg)    Expected Outcomes  Short Term: Continue to assess and modify interventions until short term weight is achieved;Long Term: Adherence to nutrition and physical activity/exercise program aimed toward attainment of established weight goal;Weight Maintenance: Understanding of the daily  nutrition guidelines, which includes 25-35% calories from fat, 7% or less cal from saturated fats, less than 200mg  cholesterol, less than 1.5gm of sodium, & 5 or more servings of fruits and vegetables daily;Understanding recommendations for meals to include 15-35% energy as protein, 25-35% energy from fat, 35-60% energy from carbohydrates, less than 200mg  of dietary cholesterol, 20-35 gm of total fiber daily;Understanding of distribution of calorie intake throughout the day with the consumption of 4-5 meals/snacks    Hypertension  Yes    Intervention  Provide education on lifestyle modifcations including regular physical activity/exercise, weight management, moderate sodium restriction and increased consumption of fresh fruit, vegetables, and low fat dairy, alcohol moderation, and smoking cessation.;Monitor prescription use compliance.    Expected Outcomes  Short Term: Continued assessment and intervention until BP is < 140/66mm HG in hypertensive participants. < 130/84mm HG in hypertensive participants with diabetes, heart failure or chronic kidney disease.;Long Term: Maintenance of blood pressure at goal levels.    Lipids  Yes    Intervention  Provide education and support for participant on nutrition & aerobic/resistive exercise along with prescribed medications to achieve LDL 70mg , HDL >40mg .    Expected Outcomes  Short Term: Participant states understanding of desired cholesterol values and is compliant with medications prescribed. Participant is following exercise prescription and nutrition guidelines.;Long Term: Cholesterol controlled with  medications as prescribed, with individualized exercise RX and with personalized nutrition plan. Value goals: LDL < 70mg , HDL > 40 mg.    Stress  Yes    Intervention  Offer individual and/or small group education and counseling on adjustment to heart disease, stress management and health-related lifestyle change. Teach and support self-help strategies.;Refer participants experiencing significant psychosocial distress to appropriate mental health specialists for further evaluation and treatment. When possible, include family members and significant others in education/counseling sessions.    Expected Outcomes  Short Term: Participant demonstrates changes in health-related behavior, relaxation and other stress management skills, ability to obtain effective social support, and compliance with psychotropic medications if prescribed.;Long Term: Emotional wellbeing is indicated by absence of clinically significant psychosocial distress or social isolation.           Core Components/Risk Factors/Patient Goals Review:    Core Components/Risk Factors/Patient Goals at Discharge (Final Review):    ITP Comments: ITP Comments    Row Name 04/26/18 0805   ITP Comments  Medical Director- Dr. Fransico Him, MD      Comments: Patient attended orientation from [phone] to 1009  to review rules and guidelines for program. Completed 6 minute walk test, Intitial ITP, and exercise prescription.  VSS. Telemetry-sinus rhythm,  C/o right calf pain, relieved with rest. Will continue to monitor.    Andi Hence, RN, BSN Cardiac Pulmonary Rehab

## 2018-04-26 NOTE — Progress Notes (Signed)
Cardiac Rehab Medication Review by a registered nurse   Does the patient  feel that his/her medications are working for him/her?  yes  Has the patient been experiencing any side effects to the medications prescribed?  yes  Does the patient measure his/her own blood pressure or blood glucose at home?  yes   Does the patient have any problems obtaining medications due to transportation or finances?   no  Understanding of regimen: good Understanding of indications: good Potential of compliance: good    RN  comments: pt appears knowledgeable and comfortable with his medication regimen. Pt does admit to financial difficulty obtaining ensure supplements. However pt denies inability to afford medications with very affordable out of pocket cost. Pt Lasix currently on hold pending repeat labs on Friday. Will continue to monitor.     Harsh Trulock Hamilton Priseis Cratty 04/26/2018 9:15 AM

## 2018-04-26 NOTE — Progress Notes (Signed)
Luis Chen 78 y.o. male DOB: 1940-08-06 MRN: [id number]      Nutrition Note  1. 02/17/18 CABG x3    Past Medical History:  Diagnosis Date  . CAD (coronary artery disease)    non obstructive. Left main normal. LAd proximal long 25% stenosis, termingating as focal 50% prox to mid lesion. First & second diag were small, normal. Circumflex in proximal av groove had luminal irregularities.. Was large mid obtuse marg which was branching, scattered luminal irregul. Infer branch did supply some septal perforators. Was PDA,small & normal.  < 1.89mm. There was prox 95% stenosis  . Cardiomyopathy    EF was 25% improved to 50%.   . CHF (congestive heart failure) ([place])   . CKD (chronic kidney disease)   . Hyperlipidemia    transient history, controlled on diet  . Hypertension    Meds reviewed.    Current Outpatient Medications (Cardiovascular):  .  carvedilol (COREG) 3.125 MG tablet, Take 1 tablet (3.125 mg total) by mouth 2 (two) times daily with a meal. .  rosuvastatin (CRESTOR) 40 MG tablet, Take 1 tablet (40 mg total) by mouth daily at 6 PM. .  furosemide (LASIX) 40 MG tablet, Take 0.5 tablets (20 mg total) by mouth daily as needed for fluid or edema (for weight gain of more than 3 Lbs in 1 day). (Patient not taking: Reported on 04/26/2018)   Current Outpatient Medications (Analgesics):  .  acetaminophen (TYLENOL) 500 MG tablet, Take 2 tablets (1,000 mg total) by mouth every 6 (six) hours. (Patient taking differently: Take 1,000 mg by mouth every 6 (six) hours as needed. ) .  aspirin EC 325 MG EC tablet, Take 1 tablet (325 mg total) by mouth daily.  Current Outpatient Medications (Hematological):  Marland Kitchen  Ferrous Gluconate 324 (37.5 Fe) MG TABS, TAKE 1 TABLET BY MOUTH TWICE DAILY WITH MEALS  Current Outpatient Medications (Other):  .  feeding supplement, ENSURE ENLIVE, (ENSURE ENLIVE) LIQD, Take 237 mLs by mouth 3 (three) times daily between meals. .  latanoprost (XALATAN) 0.005 % ophthalmic  solution, Place 1 drop into both eyes at bedtime.  .  traZODone (DESYREL) 50 MG tablet, Take 75 mg by mouth at bedtime as needed for sleep.   HT: Ht Readings from Last 1 Encounters:  04/26/18 5\' 8"  (1.727 m)    WT: Wt Readings from Last 5 Encounters:  04/26/18 134 lb 11.2 oz (61.1 kg)  04/22/18 137 lb 2 oz (62.2 kg)  03/22/18 136 lb (61.7 kg)  03/11/18 140 lb 12.8 oz (63.9 kg)  03/04/18 139 lb 3.2 oz (63.1 kg)     Body mass index is 20.48 kg/m.   Current tobacco use? No  Labs:  Lipid Panel     Component Value Date/Time   CHOL 171 02/09/2018 0253   TRIG 64 02/09/2018 0253   HDL 49 02/09/2018 0253   CHOLHDL 3.5 02/09/2018 0253   VLDL 13 02/09/2018 0253   LDLCALC 109 (H) 02/09/2018 0253    Lab Results  Component Value Date   HGBA1C 5.7 (H) 02/16/2018   CBG (last 3)  No results for input(s): GLUCAP in the last 72 hours.  Nutrition Note Spoke with pt. Nutrition plan and goals reviewed with pt. Pt is following Step 2 of the Therapeutic Lifestyle Changes diet. Pt wants to lose wt. Pt has been trying to maintain wt. Pt lost ~26 lbs  Wt loss tips reviewed (label reading, how to build a healthy plate, portion sizes, eating frequently across the  day). Pt last A1C was elevated. Discussed the differences between complex and refined carbs, recommended pt replace refined carbs with complex. Reviewed the benefits swapping in complex carbs and moderating portion sizes can have on managing blood glucose with patient. Per discussion, pt does not use canned/convenience foods often. Pt does not add salt to food. Pt does not eat out frequently. Pt expressed understanding of the information reviewed. Pt aware of nutrition education classes offered and would like to attend nutrition classes.  Nutrition Diagnosis ? Food-and nutrition-related knowledge deficit related to lack of exposure to information as related to diagnosis of: ? CVD  Nutrition Intervention ? Pt's individual nutrition plan and  goals reviewed with pt. ? Pt given handouts for: ? Nutrition I class ? Nutrition II class   ? pre-diabetes  Nutrition Goal(s):  ? Pt to identify food quantities necessary to achieve weight maintenance of at graduation from cardiac rehab ? Pt to build a healthy plate including vegetables, fruits, whole grains, and low-fat dairy products in a heart healthy meal plan ? Pt able to name foods that affect blood glucose  Plan:  ? Pt to attend nutrition classes:  ? Nutrition I ? Nutrition II ? Portion Distortion  ? Will provide client-centered nutrition education as part of interdisciplinary care ? Monitor and evaluate progress toward nutrition goal with team.   Laurina Bustle, MS, RD, LDN 04/26/2018 3:14 PM

## 2018-04-29 ENCOUNTER — Other Ambulatory Visit (HOSPITAL_COMMUNITY): Payer: Medicare Other

## 2018-05-02 ENCOUNTER — Encounter (HOSPITAL_COMMUNITY): Payer: Self-pay

## 2018-05-02 ENCOUNTER — Other Ambulatory Visit (HOSPITAL_COMMUNITY): Payer: Medicare Other

## 2018-05-02 ENCOUNTER — Encounter (HOSPITAL_COMMUNITY)
Admission: RE | Admit: 2018-05-02 | Discharge: 2018-05-02 | Disposition: A | Discharge status: Home / Self Care | Payer: Medicare Other | Source: Ambulatory Visit | Attending: Cardiology | Admitting: Cardiology

## 2018-05-02 DIAGNOSIS — Z951 Presence of aortocoronary bypass graft: Secondary | ICD-10-CM

## 2018-05-02 NOTE — Progress Notes (Signed)
Daily Session Note  Patient Details  Name: Luis Chen MRN: [id number] Date of Birth: 01-17-1941 Referring Provider:   Flowsheet Row CARDIAC REHAB PHASE II ORIENTATION from 04/26/2018 in [place]  Referring Provider  Dr. Percival Spanish      Encounter Date: 05/02/2018  Check In: Session Check In - 05/02/18 1501    Check-In          Supervising physician immediately available to respond to emergencies  [facility] immediately available    Physician(s)  Dr. Lonny Prude    Location  [facility]    Staff Present  Andi Hence, RN, Mosie Epstein, MS,ACSM CEP, Exercise Physiologist;Dalton Kris Mouton, MS, Exercise Physiologist;Brittany Durene Fruits, BS, ACSM CEP, Exercise Physiologist    Medication changes reported      No    Fall or balance concerns reported     No    Tobacco Cessation  No Change    Warm-up and Cool-down  Performed as group-led instruction    Resistance Training Performed  Yes    VAD Patient?  No    PAD/SET Patient?  No        Pain Assessment          Currently in Pain?  No/denies           Capillary Blood Glucose: No results found for this or any previous visit (from the past 24 hour(s)).  Exercise Prescription Changes - 05/02/18 0900    Response to Exercise          Blood Pressure (Admit)  102/69    Blood Pressure (Exercise)  138/68    Blood Pressure (Exit)  102/60    Heart Rate (Admit)  98 bpm    Heart Rate (Exercise)  119 bpm    Heart Rate (Exit)  88 bpm    Rating of Perceived Exertion (Exercise)  13    Symptoms  None    Comments  Pt first day of exercise.     Duration  Continue with 30 min of aerobic exercise without signs/symptoms of physical distress.    Intensity  THRR unchanged        Progression          Progression  Continue to progress workloads to maintain intensity without signs/symptoms of physical distress.    Average METs  2.5        Resistance Training          Training Prescription   Yes    Weight  3 lbs.     Reps  10-15    Time  10 Minutes        Treadmill          MPH  2.5    Grade  0    Minutes  10        Recumbant Bike          Level  2    Watts  15    Minutes  10    METs  2.1        NuStep          Level  3    SPM  75    Minutes  10    METs  2.5           Social History   Tobacco Use  Smoking Status Never Smoker  Smokeless Tobacco Never Used    Goals Met:  Exercise tolerated well  Goals Unmet:  Not Applicable  Comments: Pt started  cardiac rehab today.  Pt tolerated light exercise without difficulty. VSS, telemetry-sinus rhythm, asymptomatic.  Medication list reconciled. Pt denies barriers to medicaiton compliance.  PSYCHOSOCIAL ASSESSMENT:  PHQ-0.  Pt exhibits positive coping skills, hopeful outlook with supportive family. No psychosocial needs identified at this time, no psychosocial interventions necessary. Pt oriented to exercise equipment and routine.  Understanding verbalized.  Andi Hence, RN, BSN Cardiac Pulmonary Rehab     Dr. Fransico Him is Medical Director for Cardiac Rehab at [facility].

## 2018-05-02 NOTE — Progress Notes (Signed)
Luis Chen 78 y.o. male Nutrition Note Spoke with pt. Nutrition Plan and Nutrition Survey goals reviewed with pt. Pt is following a Heart Healthy diet. Pt wants to maintain his wt. Pt noted he has lost weight but lost the majority of it from his belly, and doesn't want it to come back. Reviewed the benefits of gaining a small amount of weight with patient who was not open to the idea at this time. Heart healthy weight maintenance tips reviewed with patient today (label reading, how to build a healthy plate, portion sizes, eating frequently across the day, high protein high calorie foods). Reviewed foods and recipes that were higher in calorie and protein with patient today and recommended pt incorporated them into his current meal plan. Pt last A1C was elevated. Discussed the differences between complex and refined carbs, recommended pt replace refined carbs with complex. Reviewed the benefits swapping in complex carbs and moderating portion sizes can have on managing blood glucose with patient. Pt with dx of CHF. Per discussion, pt does use canned/convenience foods often. Pt does not add salt to food. Pt does not eat out frequently. Pt expressed understanding of the information reviewed. Pt aware of nutrition education classes offered and does plan on attending nutrition classes.  Lab Results  Component Value Date   HGBA1C 5.7 (H) 02/16/2018    Wt Readings from Last 3 Encounters:  04/26/18 134 lb 11.2 oz (61.1 kg)  04/22/18 137 lb 2 oz (62.2 kg)  03/22/18 136 lb (61.7 kg)    Nutrition Diagnosis ? Food-and nutrition-related knowledge deficit related to lack of exposure to information as related to diagnosis of: ? CVD ? CHF ? Unintentional weight loss related to inadequate energy intake as evidenced by a BMI of 19.5, and weight loss of 26 lbs in the last 10 months  Nutrition Intervention ? Pt's individual nutrition plan reviewed with pt. ? Pt given handouts for: ? high protein high calorie  recipes  Goal(s) ? Pt to identify food quantities necessary to achieve weight maintenance at graduation from cardiac rehab.  ? Pt to build a healthy plate including vegetables, fruits, whole grains, and low-fat dairy products in a heart healthy meal plan. ? Pt able to name foods that affect blood glucose  Plan:   Pt to attend nutrition classes ? Nutrition I ? Nutrition II ? Portion Distortion   Will provide client-centered nutrition education as part of interdisciplinary care  Monitor and evaluate progress toward nutrition goal with team.    Laurina Bustle, MS, RD, LDN 05/02/2018 9:22 AM

## 2018-05-04 ENCOUNTER — Ambulatory Visit (HOSPITAL_COMMUNITY)
Admission: RE | Admit: 2018-05-04 | Discharge: 2018-05-04 | Disposition: A | Discharge status: Home / Self Care | Payer: Medicare Other | Source: Ambulatory Visit | Attending: Internal Medicine | Admitting: Internal Medicine

## 2018-05-04 ENCOUNTER — Encounter (HOSPITAL_COMMUNITY)
Admission: RE | Admit: 2018-05-04 | Discharge: 2018-05-04 | Disposition: A | Discharge status: Home / Self Care | Payer: Medicare Other | Source: Ambulatory Visit | Attending: Cardiology | Admitting: Cardiology

## 2018-05-04 DIAGNOSIS — Z951 Presence of aortocoronary bypass graft: Secondary | ICD-10-CM | POA: Diagnosis not present

## 2018-05-04 DIAGNOSIS — I5020 Unspecified systolic (congestive) heart failure: Secondary | ICD-10-CM | POA: Diagnosis not present

## 2018-05-04 LAB — BASIC METABOLIC PANEL
Anion gap: 7 (ref 5–15)
BUN: 27 mg/dL — ABNORMAL HIGH (ref 8–23)
CHLORIDE: 105 mmol/L (ref 98–111)
CO2: 24 mmol/L (ref 22–32)
Calcium: 9.6 mg/dL (ref 8.9–10.3)
Creatinine, Ser: 2.11 mg/dL — ABNORMAL HIGH (ref 0.61–1.24)
GFR calc Af Amer: 33 mL/min — ABNORMAL LOW (ref >60)
GFR calc non Af Amer: 29 mL/min — ABNORMAL LOW (ref >60)
Glucose, Bld: 163 mg/dL — ABNORMAL HIGH (ref 70–99)
Potassium: 4.2 mmol/L (ref 3.5–5.1)
Sodium: 136 mmol/L (ref 135–145)

## 2018-05-06 ENCOUNTER — Encounter (HOSPITAL_COMMUNITY)
Admission: RE | Admit: 2018-05-06 | Discharge: 2018-05-06 | Disposition: A | Discharge status: Home / Self Care | Payer: Medicare Other | Source: Ambulatory Visit | Attending: Cardiology | Admitting: Cardiology

## 2018-05-06 DIAGNOSIS — Z951 Presence of aortocoronary bypass graft: Secondary | ICD-10-CM | POA: Diagnosis not present

## 2018-05-09 ENCOUNTER — Encounter (HOSPITAL_COMMUNITY): Payer: Self-pay

## 2018-05-09 ENCOUNTER — Encounter (HOSPITAL_COMMUNITY)
Admission: RE | Admit: 2018-05-09 | Discharge: 2018-05-09 | Disposition: A | Discharge status: Home / Self Care | Payer: Medicare Other | Source: Ambulatory Visit | Attending: Cardiology | Admitting: Cardiology

## 2018-05-09 DIAGNOSIS — Z951 Presence of aortocoronary bypass graft: Secondary | ICD-10-CM | POA: Insufficient documentation

## 2018-05-10 ENCOUNTER — Encounter (HOSPITAL_COMMUNITY): Payer: Self-pay

## 2018-05-11 ENCOUNTER — Telehealth: Payer: Self-pay | Admitting: *Deleted

## 2018-05-11 ENCOUNTER — Encounter (HOSPITAL_COMMUNITY)
Admission: RE | Admit: 2018-05-11 | Discharge: 2018-05-11 | Disposition: A | Discharge status: Home / Self Care | Payer: Medicare Other | Source: Ambulatory Visit | Attending: Cardiology | Admitting: Cardiology

## 2018-05-11 DIAGNOSIS — Z951 Presence of aortocoronary bypass graft: Secondary | ICD-10-CM

## 2018-05-11 NOTE — Progress Notes (Signed)
Cardiac Individual Treatment Plan  Patient Details  Name: Luis Chen MRN: [id number] Date of Birth: 1940/08/22 Referring Provider:   Flowsheet Row CARDIAC REHAB PHASE II ORIENTATION from 04/26/2018 in [facility]  Referring Provider  Dr. Percival Spanish      Initial Encounter Date:  Flowsheet Row CARDIAC REHAB PHASE II ORIENTATION from 04/26/2018 in [facility]  Date  04/26/18      Visit Diagnosis: 02/17/18 CABG x3  Patient's Home Medications on Admission:  Current Outpatient Medications:  .  acetaminophen (TYLENOL) 500 MG tablet, Take 2 tablets (1,000 mg total) by mouth every 6 (six) hours. (Patient taking differently: Take 1,000 mg by mouth every 6 (six) hours as needed. ), Disp: 30 tablet, Rfl: 0 .  aspirin EC 325 MG EC tablet, Take 1 tablet (325 mg total) by mouth daily., Disp: 30 tablet, Rfl: 0 .  carvedilol (COREG) 3.125 MG tablet, Take 1 tablet (3.125 mg total) by mouth 2 (two) times daily with a meal., Disp: 180 tablet, Rfl: 3 .  feeding supplement, ENSURE ENLIVE, (ENSURE ENLIVE) LIQD, Take 237 mLs by mouth 3 (three) times daily between meals., Disp: 237 mL, Rfl: 12 .  Ferrous Gluconate 324 (37.5 Fe) MG TABS, TAKE 1 TABLET BY MOUTH TWICE DAILY WITH MEALS, Disp: 60 tablet, Rfl: 1 .  furosemide (LASIX) 40 MG tablet, Take 0.5 tablets (20 mg total) by mouth daily as needed for fluid or edema (for weight gain of more than 3 Lbs in 1 day)., Disp: 30 tablet, Rfl: 0 .  latanoprost (XALATAN) 0.005 % ophthalmic solution, Place 1 drop into both eyes at bedtime. , Disp: , Rfl:  .  rosuvastatin (CRESTOR) 40 MG tablet, Take 1 tablet (40 mg total) by mouth daily at 6 PM., Disp: 30 tablet, Rfl: 5 .  traZODone (DESYREL) 50 MG tablet, Take 75 mg by mouth at bedtime as needed for sleep., Disp: , Rfl:   Past Medical History: Past Medical History:  Diagnosis Date  . CAD (coronary artery disease)    non obstructive. Left main normal. LAd  proximal long 25% stenosis, termingating as focal 50% prox to mid lesion. First & second diag were small, normal. Circumflex in proximal av groove had luminal irregularities.. Was large mid obtuse marg which was branching, scattered luminal irregul. Infer branch did supply some septal perforators. Was PDA,small & normal.  < 1.49m. There was prox 95% stenosis  . Cardiomyopathy    EF was 25% improved to 50%.   . [place]ive [place]   . CKD (chronic kidney disease)   . Hyperlipidemia    transient history, controlled on diet  . Hypertension     Tobacco Use: Social History   Tobacco Use  Smoking Status Never Smoker  Smokeless Tobacco Never Used    Labs: Recent Review Flowsheet Data    Labs for ITP Cardiac and Pulmonary Rehab Latest Ref Rng & Units 02/18/2018 02/18/2018 02/18/2018 02/18/2018 02/18/2018   Cholestrol 0 - 200 mg/dL - - - - -   LDLCALC 0 - 99 mg/dL - - - - -   HDL >40 mg/dL - - - - -   Trlycerides <150 mg/dL - - - - -   Hemoglobin A1c 4.8 - 5.6 % - - - - -   PHART 7.350 - 7.450 7.304(L) - - - -   PCO2ART 32.0 - 48.0 mmHg 41.9 - - - -   HCO3 20.0 - 28.0 mmol/L 20.7 - - - -  TCO2 22 - 32 mmol/L 22 21(L) - - -   ACIDBASEDEF 0.0 - 2.0 mmol/L 5.0(H) - - - -   O2SAT % 100.0 - 43.1 42.1 59.3      Capillary Blood Glucose: Lab Results  Component Value Date   GLUCAP 89 02/21/2018   GLUCAP 90 02/21/2018   GLUCAP 125 (H) 02/20/2018   GLUCAP 108 (H) 02/20/2018   GLUCAP 126 (H) 02/20/2018     Exercise Target Goals: Exercise Program Goal: Individual exercise prescription set using results from initial 6 min walk test and THRR while considering  patient's activity barriers and safety.   Exercise Prescription Goal: Initial exercise prescription builds to 30-45 minutes a day of aerobic activity, 2-3 days per week.  Home exercise guidelines will be given to patient during program as part of exercise prescription that the participant will  acknowledge.  Activity Barriers & Risk Stratification: Activity Barriers & Cardiac Risk Stratification - 04/26/18 1056    Activity Barriers & Cardiac Risk Stratification          Activity Barriers  Muscular Weakness;Deconditioning;Other (comment)    Comments  Right calf pain    Cardiac Risk Stratification  High           6 Minute Walk: 6 Minute Walk    6 Minute Walk    Row Name 04/26/18 1038   Phase  Initial   Distance  1401 feet   Walk Time  6 minutes   # of Rest Breaks  0   MPH  2.6   METS  2.9   RPE  11   VO2 Peak  10.25   Symptoms  Yes (comment)   Comments  Right calf pain   Resting HR  89 bpm   Resting BP  98/60   Resting Oxygen Saturation   99 %   Exercise Oxygen Saturation  during 6 min walk  97 %   Max Ex. HR  106 bpm   Max Ex. BP  112/78   2 Minute Post BP  104/60          Oxygen Initial Assessment:   Oxygen Re-Evaluation:   Oxygen Discharge (Final Oxygen Re-Evaluation):   Initial Exercise Prescription: Initial Exercise Prescription - 04/26/18 1000    Date of Initial Exercise RX and Referring Provider          Date  04/26/18    Referring Provider  Dr. Percival Spanish    Expected Discharge Date  07/29/18        Treadmill          MPH  2.5    Grade  0    Minutes  10        Recumbant Bike          Level  2    Watts  15    Minutes  10    METs  2.79        NuStep          Level  3    SPM  75    Minutes  10    METs  2.8        Prescription Details          Frequency (times per week)  3    Duration  Progress to 30 minutes of continuous aerobic without signs/symptoms of physical distress        Intensity          THRR 40-80% of Max Heartrate  59-113  Ratings of Perceived Exertion  11-13        Progression          Progression  Continue to progress workloads to maintain intensity without signs/symptoms of physical distress.        Resistance Training          Training Prescription  Yes    Weight  3 lbs.     Reps  10-15            Perform Capillary Blood Glucose checks as needed.  Exercise Prescription Changes: Exercise Prescription Changes    Response to Exercise    Row Name 05/02/18 0900   Blood Pressure (Admit)  102/69   Blood Pressure (Exercise)  138/68   Blood Pressure (Exit)  102/60   Heart Rate (Admit)  98 bpm   Heart Rate (Exercise)  119 bpm   Heart Rate (Exit)  88 bpm   Rating of Perceived Exertion (Exercise)  13   Symptoms  None   Comments  Pt first day of exercise.    Duration  Continue with 30 min of aerobic exercise without signs/symptoms of physical distress.   Intensity  THRR unchanged       Progression    Row Name 05/02/18 0900   Progression  Continue to progress workloads to maintain intensity without signs/symptoms of physical distress.   Average METs  2.5       Resistance Training    Row Name 05/02/18 0900   Training Prescription  Yes   Weight  3 lbs.    Reps  10-15   Time  10 Minutes       Treadmill    Row Name 05/02/18 0900   MPH  2.5   Grade  0   Minutes  [place] Name 05/02/18 0900   Level  2   Watts  15   Minutes  10   METs  2.1       NuStep    Row Name 05/02/18 0900   Level  3   SPM  75   Minutes  10   METs  2.5          Exercise Comments: Exercise Comments    Row Name 05/02/18 1003 05/10/18 1444   Exercise Comments  Pt first day of exericse. Pt tolerated exercise well.   Reviewed METs and goals with Pt. Pt is tolerating exercise well.       Exercise Goals and Review: Exercise Goals    Exercise Goals    Row Name 04/26/18 1111   Increase Physical Activity  Yes   Intervention  Provide advice, education, support and counseling about physical activity/exercise needs.;Develop an individualized exercise prescription for aerobic and resistive training based on initial evaluation findings, risk stratification, comorbidities and participant's personal goals.   Expected Outcomes  Short Term: Attend rehab on a regular  basis to increase amount of physical activity.   Increase Strength and Stamina  Yes   Intervention  Provide advice, education, support and counseling about physical activity/exercise needs.;Develop an individualized exercise prescription for aerobic and resistive training based on initial evaluation findings, risk stratification, comorbidities and participant's personal goals.   Expected Outcomes  Short Term: Increase workloads from initial exercise prescription for resistance, speed, and METs.   Able to understand and use rate of perceived exertion (RPE) scale  Yes   Intervention  Provide education and explanation on how to use RPE scale  Expected Outcomes  Short Term: Able to use RPE daily in rehab to express subjective intensity level;Long Term:  Able to use RPE to guide intensity level when exercising independently   Knowledge and understanding of Target Heart Rate Range (THRR)  Yes   Intervention  Provide education and explanation of THRR including how the numbers were predicted and where they are located for reference   Expected Outcomes  Short Term: Able to state/look up THRR;Long Term: Able to use THRR to govern intensity when exercising independently;Short Term: Able to use daily as guideline for intensity in rehab   Able to check pulse independently  Yes   Intervention  Provide education and demonstration on how to check pulse in carotid and radial arteries.;Review the importance of being able to check your own pulse for safety during independent exercise   Expected Outcomes  Short Term: Able to explain why pulse checking is important during independent exercise;Long Term: Able to check pulse independently and accurately   Understanding of Exercise Prescription  Yes   Intervention  Provide education, explanation, and written materials on patient's individual exercise prescription   Expected Outcomes  Short Term: Able to explain program exercise prescription;Long Term: Able to explain home  exercise prescription to exercise independently          Exercise Goals Re-Evaluation : Exercise Goals Re-Evaluation    Exercise Goal Re-Evaluation    Row Name 05/02/18 0957 05/10/18 1442   Exercise Goals Review  Increase Physical Activity;Increase Strength and Stamina;Able to understand and use rate of perceived exertion (RPE) scale;Knowledge and understanding of Target Heart Rate Range (THRR);Understanding of Exercise Prescription  Increase Physical Activity;Increase Strength and Stamina;Able to understand and use rate of perceived exertion (RPE) scale;Knowledge and understanding of Target Heart Rate Range (THRR);Understanding of Exercise Prescription   Comments  Pt first day of Cardiac Rehab. Pt tolerated exercise well and understands RPE scale, THRR, and workloads. Will progress Pt as tolerated.   Reviewed METs and goals with Pt. Pt MET level is 2.5. Pt is tolerating exercise well. Pt is walking 2-4 days 30-45 minutes. Pt has goals to add time on a stationary bike to his daily exercise routine.    Expected Outcomes  Will continue to monitor and progress Pt as tolerated.   Will continue to monitor and progress Pt as tolerated.           Discharge Exercise Prescription (Final Exercise Prescription Changes): Exercise Prescription Changes - 05/02/18 0900    Response to Exercise          Blood Pressure (Admit)  102/69    Blood Pressure (Exercise)  138/68    Blood Pressure (Exit)  102/60    Heart Rate (Admit)  98 bpm    Heart Rate (Exercise)  119 bpm    Heart Rate (Exit)  88 bpm    Rating of Perceived Exertion (Exercise)  13    Symptoms  None    Comments  Pt first day of exercise.     Duration  Continue with 30 min of aerobic exercise without signs/symptoms of physical distress.    Intensity  THRR unchanged        Progression          Progression  Continue to progress workloads to maintain intensity without signs/symptoms of physical distress.    Average METs  2.5         Resistance Training          Training Prescription  Yes  Weight  3 lbs.     Reps  10-15    Time  10 Minutes        Treadmill          MPH  2.5    Grade  0    Minutes  10        Recumbant Bike          Level  2    Watts  15    Minutes  10    METs  2.1        NuStep          Level  3    SPM  75    Minutes  10    METs  2.5           Nutrition:  Target Goals: Understanding of nutrition guidelines, daily intake of sodium <1522m, cholesterol <2063m calories 30% from fat and 7% or less from saturated fats, daily to have 5 or more servings of fruits and vegetables.  Biometrics: Pre Biometrics - 04/26/18 1112    Pre Biometrics          Height  '5\' 8"'  (1.727 m)    Weight  61.1 kg    Waist Circumference  32.5 inches    Hip Circumference  38.25 inches    Waist to Hip Ratio  0.85 %    BMI (Calculated)  20.49    Triceps Skinfold  10 mm    % Body Fat  20.2 %    Grip Strength  42 kg    Flexibility  9 in    Single Leg Stand  20 seconds            Nutrition Therapy Plan and Nutrition Goals: Nutrition Therapy & Goals - 04/26/18 1517    Nutrition Therapy          Diet  general healthful        Personal Nutrition Goals          Nutrition Goal  Pt to identify food quantities necessary to achieve weight maintenance of at graduation from cardiac rehab    Personal Goal #2  Pt to build a healthy plate including vegetables, fruits, whole grains, and low-fat dairy products in a heart healthy meal plan    Personal Goal #3  Pt able to name foods that affect blood glucose        Intervention Plan          Intervention  Prescribe, educate and counsel regarding individualized specific dietary modifications aiming towards targeted core components such as weight, hypertension, lipid management, diabetes, heart failure and other comorbidities.    Expected Outcomes  Short Term Goal: Understand basic principles of dietary content, such as calories, fat, sodium, cholesterol and  nutrients.;Long Term Goal: Adherence to prescribed nutrition plan.           Nutrition Assessments: Nutrition Assessments - 04/26/18 1522    MEDFICTS Scores          Pre Score  26           Nutrition Goals Re-Evaluation: Nutrition Goals Re-Evaluation    Goals    Row Name 04/26/18 1517   Current Weight  134 lb 11.2 oz (61.1 kg)          Nutrition Goals Re-Evaluation: Nutrition Goals Re-Evaluation    Goals    Row Name 04/26/18 1517   Current Weight  134 lb 11.2 oz (61.1 kg)  Nutrition Goals Discharge (Final Nutrition Goals Re-Evaluation): Nutrition Goals Re-Evaluation - 04/26/18 1517    Goals          Current Weight  134 lb 11.2 oz (61.1 kg)           Psychosocial: Target Goals: Acknowledge presence or absence of significant depression and/or stress, maximize coping skills, provide positive support system. Participant is able to verbalize types and ability to use techniques and skills needed for reducing stress and depression.  Initial Review & Psychosocial Screening: Initial Psych Review & Screening - 04/26/18 1133    Initial Review          Current issues with  None Identified        Family Dynamics          Good Support System?  Yes        Barriers          Psychosocial barriers to participate in program  There are no identifiable barriers or psychosocial needs.        Screening Interventions          Interventions  Encouraged to exercise           Quality of Life Scores: Quality of Life - 04/26/18 0943    Quality of Life          Select  Quality of Life        Quality of Life Scores          Health/Function Pre  25.9 %    Socioeconomic Pre  24.75 %    Psych/Spiritual Pre  24.86 %    Family Pre  24 %    GLOBAL Pre  25.16 %          Scores of 19 and below usually indicate a poorer quality of life in these areas.  A difference of  2-3 points is a clinically meaningful difference.  A difference of 2-3 points in the  total score of the Quality of Life Index has been associated with significant improvement in overall quality of life, self-image, physical symptoms, and general health in studies assessing change in quality of life.  PHQ-9: Recent Review Flowsheet Data    Depression screen [facility] 2/9 05/02/2018 08/25/2017 02/23/2017   Decreased Interest 0 0 0   Down, Depressed, Hopeless 0 0 1   PHQ - 2 Score 0 0 1     Interpretation of Total Score  Total Score Depression Severity:  1-4 = Minimal depression, 5-9 = Mild depression, 10-14 = Moderate depression, 15-19 = Moderately severe depression, 20-27 = Severe depression   Psychosocial Evaluation and Intervention: Psychosocial Evaluation - 05/02/18 1504    Psychosocial Evaluation & Interventions          Interventions  Encouraged to exercise with the program and follow exercise prescription    Comments  no psychosocial needs identified, no interventions necessary.  pt enjoys bowling on league and listening to jazz/gospel music.  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     Expected Outcomes  pt will exhibit positive outlook with good coping skills.     Continue Psychosocial Services   No Follow up required           Psychosocial Re-Evaluation: Psychosocial Re-Evaluation    Psychosocial Re-Evaluation    [place] Name 05/10/18 1313   Current issues with  None Identified   Comments  no psychosocial needs identified, no interventions necessary    Expected Outcomes  pt will exhibit positive outlook with good coping skills.    Interventions  Encouraged to attend Cardiac Rehabilitation for the exercise   Continue Psychosocial Services   No Follow up required          Psychosocial Discharge (Final Psychosocial Re-Evaluation): Psychosocial Re-Evaluation - 05/10/18 1313    Psychosocial Re-Evaluation          Current issues with  None Identified    Comments  no psychosocial needs identified, no interventions necessary     Expected  Outcomes  pt will exhibit positive outlook with good coping skills.     Interventions  Encouraged to attend Cardiac Rehabilitation for the exercise    Continue Psychosocial Services   No Follow up required           Vocational Rehabilitation: Provide vocational rehab assistance to qualifying candidates.   Vocational Rehab Evaluation & Intervention: Vocational Rehab - 04/26/18 1133    Initial Vocational Rehab Evaluation & Intervention          Assessment shows need for Vocational Rehabilitation  No   works PT at [employer]          Education: Education Goals: Education classes will be provided on a weekly basis, covering required topics. Participant will state understanding/return demonstration of topics presented.  Learning Barriers/Preferences: Learning Barriers/Preferences - 04/26/18 1112    Learning Barriers/Preferences          Learning Barriers  Sight    Learning Preferences  Audio;Individual Instruction;Video           Education Topics: Count Your Pulse:  -Group instruction provided by verbal instruction, demonstration, patient participation and written materials to support subject.  Instructors address importance of being able to find your pulse and how to count your pulse when at home without a heart monitor.  Patients get hands on experience counting their pulse with staff help and individually.   Heart Attack, Angina, and Risk Factor Modification:  -Group instruction provided by verbal instruction, video, and written materials to support subject.  Instructors address signs and symptoms of angina and heart attacks.    Also discuss risk factors for heart disease and how to make changes to improve heart health risk factors.   Functional Fitness:  -Group instruction provided by verbal instruction, demonstration, patient participation, and written materials to support subject.  Instructors address safety measures for doing things around the house.   Discuss how to get up and down off the floor, how to pick things up properly, how to safely get out of a chair without assistance, and balance training.   Meditation and Mindfulness:  -Group instruction provided by verbal instruction, patient participation, and written materials to support subject.  Instructor addresses importance of mindfulness and meditation practice to help reduce stress and improve awareness.  Instructor also leads participants through a meditation exercise.  Flowsheet Row CARDIAC REHAB PHASE II EXERCISE from 05/11/2018 in [place]  Date  05/11/18  Instruction Review Code  2- Demonstrated Understanding      Stretching for Flexibility and Mobility:  -Group instruction provided by verbal instruction, patient participation, and written materials to support subject.  Instructors lead participants through series of stretches that are designed to increase flexibility thus improving mobility.  These stretches are additional exercise for major muscle groups that are typically performed during regular warm up and cool down.   Hands Only CPR:  -Group verbal, video, and participation provides a basic overview of AHA guidelines for  community CPR. Role-play of emergencies allow participants the opportunity to practice calling for help and chest compression technique with discussion of AED use.   Hypertension: -Group verbal and written instruction that provides a basic overview of hypertension including the most recent diagnostic guidelines, risk factor reduction with self-care instructions and medication management.    Nutrition I class: Heart Healthy Eating:  -Group instruction provided by PowerPoint slides, verbal discussion, and written materials to support subject matter. The instructor gives an explanation and review of the Therapeutic Lifestyle Changes diet recommendations, which includes a discussion on lipid goals, dietary fat, sodium, fiber,  plant stanol/sterol esters, sugar, and the components of a well-balanced, healthy diet.   Nutrition II class: Lifestyle Skills:  -Group instruction provided by PowerPoint slides, verbal discussion, and written materials to support subject matter. The instructor gives an explanation and review of label reading, grocery shopping for heart health, heart healthy recipe modifications, and ways to make healthier choices when eating out.   Diabetes Question & Answer:  -Group instruction provided by PowerPoint slides, verbal discussion, and written materials to support subject matter. The instructor gives an explanation and review of diabetes co-morbidities, pre- and post-prandial blood glucose goals, pre-exercise blood glucose goals, signs, symptoms, and treatment of hypoglycemia and hyperglycemia, and foot care basics.   Diabetes Blitz:  -Group instruction provided by PowerPoint slides, verbal discussion, and written materials to support subject matter. The instructor gives an explanation and review of the physiology behind type 1 and type 2 diabetes, diabetes medications and rational behind using different medications, pre- and post-prandial blood glucose recommendations and Hemoglobin A1c goals, diabetes diet, and exercise including blood glucose guidelines for exercising safely.    Portion Distortion:  -Group instruction provided by PowerPoint slides, verbal discussion, written materials, and food models to support subject matter. The instructor gives an explanation of serving size versus portion size, changes in portions sizes over the last 20 years, and what consists of a serving from each food group.   Stress Management:  -Group instruction provided by verbal instruction, video, and written materials to support subject matter.  Instructors review role of stress in heart disease and how to cope with stress positively.     Exercising on Your Own:  -Group instruction provided by verbal  instruction, power point, and written materials to support subject.  Instructors discuss benefits of exercise, components of exercise, frequency and intensity of exercise, and end points for exercise.  Also discuss use of nitroglycerin and activating EMS.  Review options of places to exercise outside of rehab.  Review guidelines for sex with heart disease. Flowsheet Row CARDIAC REHAB PHASE II EXERCISE from 05/11/2018 in [place]  Date  05/04/18  Educator  EP  Instruction Review Code  2- Demonstrated Understanding      Cardiac Drugs I:  -Group instruction provided by verbal instruction and written materials to support subject.  Instructor reviews cardiac drug classes: antiplatelets, anticoagulants, beta blockers, and statins.  Instructor discusses reasons, side effects, and lifestyle considerations for each drug class.   Cardiac Drugs II:  -Group instruction provided by verbal instruction and written materials to support subject.  Instructor reviews cardiac drug classes: angiotensin converting enzyme inhibitors (ACE-I), angiotensin II receptor blockers (ARBs), nitrates, and calcium channel blockers.  Instructor discusses reasons, side effects, and lifestyle considerations for each drug class.   Anatomy and Physiology of the Circulatory System:  Group verbal and written instruction and models provide basic cardiac anatomy and physiology, with the  coronary electrical and arterial systems. Review of: AMI, Angina, Valve disease, Heart Failure, Peripheral Artery Disease, Cardiac Arrhythmia, Pacemakers, and the ICD.   Other Education:  -Group or individual verbal, written, or video instructions that support the educational goals of the cardiac rehab program.   Holiday Eating Survival Tips:  -Group instruction provided by PowerPoint slides, verbal discussion, and written materials to support subject matter. The instructor gives patients tips, tricks, and techniques to  help them not only survive but enjoy the holidays despite the onslaught of food that accompanies the holidays.   Knowledge Questionnaire Score: Knowledge Questionnaire Score - 04/26/18 0943    Knowledge Questionnaire Score          Pre Score  19/24           Core Components/Risk Factors/Patient Goals at Admission: Personal Goals and Risk Factors at Admission - 04/26/18 1113    Core Components/Risk Factors/Patient Goals on Admission           Weight Management  Yes;Weight Maintenance    Intervention  Weight Management: Develop a combined nutrition and exercise program designed to reach desired caloric intake, while maintaining appropriate intake of nutrient and fiber, sodium and fats, and appropriate energy expenditure required for the weight goal.;Weight Management: Provide education and appropriate resources to help participant work on and attain dietary goals.    Admit Weight  134 lb 11.2 oz (61.1 kg)    Expected Outcomes  Short Term: Continue to assess and modify interventions until short term weight is achieved;Long Term: Adherence to nutrition and physical activity/exercise program aimed toward attainment of established weight goal;Weight Maintenance: Understanding of the daily nutrition guidelines, which includes 25-35% calories from fat, 7% or less cal from saturated fats, less than 255m cholesterol, less than 1.5gm of sodium, & 5 or more servings of fruits and vegetables daily;Understanding recommendations for meals to include 15-35% energy as protein, 25-35% energy from fat, 35-60% energy from carbohydrates, less than 2049mof dietary cholesterol, 20-35 gm of total fiber daily;Understanding of distribution of calorie intake throughout the day with the consumption of 4-5 meals/snacks    Hypertension  Yes    Intervention  Provide education on lifestyle modifcations including regular physical activity/exercise, weight management, moderate sodium restriction and increased consumption of  fresh fruit, vegetables, and low fat dairy, alcohol moderation, and smoking cessation.;Monitor prescription use compliance.    Expected Outcomes  Short Term: Continued assessment and intervention until BP is < 140/9033mG in hypertensive participants. < 130/30m12m in hypertensive participants with diabetes, heart failure or chronic kidney disease.;Long Term: Maintenance of blood pressure at goal levels.    Lipids  Yes    Intervention  Provide education and support for participant on nutrition & aerobic/resistive exercise along with prescribed medications to achieve LDL <70mg50mL >40mg.77mExpected Outcomes  Short Term: Participant states understanding of desired cholesterol values and is compliant with medications prescribed. Participant is following exercise prescription and nutrition guidelines.;Long Term: Cholesterol controlled with medications as prescribed, with individualized exercise RX and with personalized nutrition plan. Value goals: LDL < 70mg, 13m> 40 mg.    Stress  Yes    Intervention  Offer individual and/or small group education and counseling on adjustment to heart disease, stress management and health-related lifestyle change. Teach and support self-help strategies.;Refer participants experiencing significant psychosocial distress to appropriate mental health specialists for further evaluation and treatment. When possible, include family members and significant others in education/counseling sessions.    Expected Outcomes  Short Term: Participant demonstrates changes in health-related behavior, relaxation and other stress management skills, ability to obtain effective social support, and compliance with psychotropic medications if prescribed.;Long Term: Emotional wellbeing is indicated by absence of clinically significant psychosocial distress or social isolation.           Core Components/Risk Factors/Patient Goals Review:  Goals and Risk Factor Review    Core Components/Risk  Factors/Patient Goals Review    Row Name 05/02/18 1505 05/10/18 1314   Personal Goals Review  Weight Management/Obesity;Hypertension;Lipids;Stress  Weight Management/Obesity;Hypertension;Lipids;Stress   Review  pt with mulitple CAD RF demonstrates willingness to participate in CR program. pt personal goals are to increase strength/stamina, balance and upper arm strength.   pt with mulitple CAD RF demonstrates willingness to participate in CR program. pt personal goals are to increase strength/stamina, balance and upper arm strength.    Expected Outcomes  pt will participate in CR exercise, nutrition and lifestyle modification to decrease overall RF.   pt will participate in CR exercise, nutrition and lifestyle modification to decrease overall RF.           Core Components/Risk Factors/Patient Goals at Discharge (Final Review):  Goals and Risk Factor Review - 05/10/18 1314    Core Components/Risk Factors/Patient Goals Review          Personal Goals Review  Weight Management/Obesity;Hypertension;Lipids;Stress    Review  pt with mulitple CAD RF demonstrates willingness to participate in CR program. pt personal goals are to increase strength/stamina, balance and upper arm strength.     Expected Outcomes  pt will participate in CR exercise, nutrition and lifestyle modification to decrease overall RF.            ITP Comments: ITP Comments    Row Name 04/26/18 0805 05/02/18 1502 05/10/18 1312   ITP Comments  Medical Director- Dr. Fransico Him, MD  pt started group exercise. pt tolerated light activity without difficulty. pt oriented to exercise equipment and safety routine.   30 day ITP review. pt demonstrates eagerness to participate in CR program.      Comments:

## 2018-05-11 NOTE — Telephone Encounter (Signed)
Pt recently had a triple by pass on 02/17/2018, pt was cleared by Dr Cyndia Bent and Dr Dierdre Forth, pt stated he is in heart rehab. Pt want to know if he is ok to return to work, pt work at [employer], he do not do any lifting, he is a lot attendant, pt want to know if he is ok to return to work or do he need a f/u appointment with you first.

## 2018-05-12 NOTE — Telephone Encounter (Signed)
OK to return to work without lifting.

## 2018-05-13 ENCOUNTER — Encounter: Payer: Self-pay | Admitting: *Deleted

## 2018-05-13 ENCOUNTER — Encounter (HOSPITAL_COMMUNITY)
Admission: RE | Admit: 2018-05-13 | Discharge: 2018-05-13 | Disposition: A | Discharge status: Home / Self Care | Payer: Medicare Other | Source: Ambulatory Visit | Attending: Cardiology | Admitting: Cardiology

## 2018-05-13 DIAGNOSIS — Z951 Presence of aortocoronary bypass graft: Secondary | ICD-10-CM

## 2018-05-13 DIAGNOSIS — Z006 Encounter for examination for normal comparison and control in clinical research program: Secondary | ICD-10-CM

## 2018-05-13 NOTE — Telephone Encounter (Signed)
Leave message for pt to call back 

## 2018-05-13 NOTE — Research (Signed)
[facility] Research study 90 day follow up: Subject met inclusion and exclusion criteria.  The informed consent form, study requirements and expectations were reviewed with the subject and questions and concerns were addressed prior to the signing of the consent form.  The subject verbalized understanding of the trail requirements.  The subject agreed to continue participating in the [facility] trial and signed the informed consent Version 3.0. The informed consent was obtained prior to performance of any protocol-specific procedures for the subject.  A copy of the signed informed consent was given to the subject.  Patient states he haas been doing well, denies any adverse events, changes in medication. This concludes the follow up for the research study. I thanked him for his participation.

## 2018-05-13 NOTE — Telephone Encounter (Signed)
Letter written and placed at front desk for pt to pick up, pt made aware.

## 2018-05-16 ENCOUNTER — Encounter (HOSPITAL_COMMUNITY)
Admission: RE | Admit: 2018-05-16 | Discharge: 2018-05-16 | Disposition: A | Discharge status: Home / Self Care | Payer: Medicare Other | Source: Ambulatory Visit | Attending: Cardiology | Admitting: Cardiology

## 2018-05-16 DIAGNOSIS — Z951 Presence of aortocoronary bypass graft: Secondary | ICD-10-CM | POA: Diagnosis not present

## 2018-05-17 NOTE — Progress Notes (Signed)
Advanced Heart Failure Clinic Note   PCP: Leamon Arnt, MD PCP-Cardiologist: Dr Aundra Dubin  HPI:  Luis Chen is a 78 y.o. male with a history of systolic HF with improved EF from 25% in 2010 -> 50%2011 due to NICM,nonobstructiveCAD, CKD, HL, and HTN.  Went for [facility] 02/08/18 LHC which showed severe multivessel disease with normal output and EF<25%. Meds adjusted as tolerated. Pt requested to go home, and return for CABG the following week.   Pt admitted 02/17/18 for planned CABG. He tolerated the procedure well. First degree block noted POD 4, but remained stable on low-dose coreg. Potassium supp discontinued. Meds adjusted as tolerated. Discharged 02/22/18 in stable condition.   He returns today for HF follow up. Last visit was set up for repeat echo, but did not complete. He had AKI on labs, so losartan was stopped. Creatinine has improved a little bit 2.31 -> 2.11 on 2/26. Overall doing well. He is going to cardiac rehab now. No dizziness. Has not needed to take PRN lasix. Denies SOB. Doing cardio and trampoline work outs at cardiac rehab and does stationary bike for 15-20 minutes at home on non rehab days. No edema, orthopnea, or PND. No CP. He is back to work. Working part time at [employer], but plans to go back full time soon. Taking all medications.   cMRI 02/10/18  1. Moderately dilated LV with EF 22%, diffuse hypokinesis with regional variation. 2.  Normal RV size with mildly decreased systolic function, EF 78%. 3. Nonspecific LGE at the anterior RV insertion site, this can be associated with LV volume/pressure overload. Possible coronary pattern LGE at the true apex but subtle and obscured by artifact. There is not significant coronary pattern scarring present on this scan.  Luis Chen 02/08/18: Ao sat 96%, PA sat 63%, PA pressure mean 25 mm Hg; PCWP mean 23 mm Hg, CO 4.8 L/min; CI 2.6  Review of systems complete and found to be negative unless listed in HPI.   Past Medical  History 1. CAD: Severe 3 vessel disease with occluded RCA with relatively mild anginal symptoms.  - s/p CABG 29/56/21 with uncomplicated post op course.  2. Chronic systolic CHF: Suspected mixed cardiomyopathy.  - Echo 02/2018: EF 20-25%, severe diffuse HK. Cardiac MRI showed EF 22%, relative preservation of RV function (EF 42%) and minimal LGE (only small area of non-coronary pattern LGE at the anterior RV insertion site). - H/o angioedema with ACEI so not candidate for Entresto.  -Will plan repeat echo 3 months after CABGto determine ICD need (narrow QRS, not CRT candidate). 3. CKD Stage 3  Past Medical History:  Diagnosis Date  . CAD (coronary artery disease)    non obstructive. Left main normal. LAd proximal long 25% stenosis, termingating as focal 50% prox to mid lesion. First & second diag were small, normal. Circumflex in proximal av groove had luminal irregularities.. Was large mid obtuse marg which was branching, scattered luminal irregul. Infer branch did supply some septal perforators. Was PDA,small & normal.  < 1.46mm. There was prox 95% stenosis  . Cardiomyopathy    EF was 25% improved to 50%.   . CHF (congestive heart failure) ([place])   . CKD (chronic kidney disease)   . Hyperlipidemia    transient history, controlled on diet  . Hypertension     Current Outpatient Medications  Medication Sig Dispense Refill  . acetaminophen (TYLENOL) 500 MG tablet Take 2 tablets (1,000 mg total) by mouth every 6 (six) hours. (Patient taking differently:  Take 1,000 mg by mouth every 6 (six) hours as needed. ) 30 tablet 0  . aspirin EC 325 MG EC tablet Take 1 tablet (325 mg total) by mouth daily. 30 tablet 0  . carvedilol (COREG) 3.125 MG tablet Take 1 tablet (3.125 mg total) by mouth 2 (two) times daily with a meal. 180 tablet 3  . feeding supplement, ENSURE ENLIVE, (ENSURE ENLIVE) LIQD Take 237 mLs by mouth 3 (three) times daily between meals. 237 mL 12  . Ferrous Gluconate 324 (37.5 Fe)  MG TABS TAKE 1 TABLET BY MOUTH TWICE DAILY WITH MEALS 60 tablet 1  . furosemide (LASIX) 40 MG tablet Take 0.5 tablets (20 mg total) by mouth daily as needed for fluid or edema (for weight gain of more than 3 Lbs in 1 day). 30 tablet 0  . latanoprost (XALATAN) 0.005 % ophthalmic solution Place 1 drop into both eyes at bedtime.     . rosuvastatin (CRESTOR) 40 MG tablet Take 1 tablet (40 mg total) by mouth daily at 6 PM. 30 tablet 5  . traZODone (DESYREL) 50 MG tablet Take 100 mg by mouth at bedtime as needed for sleep.      No current facility-administered medications for this encounter.     Allergies  Allergen Reactions  . Ace Inhibitors Swelling    Facial swelling       Social History   Socioeconomic History  . Marital status: Widowed    Spouse name: Not on file  . Number of children: Not on file  . Years of education: Not on file  . Highest education level: Not on file  Occupational History  . Not on file  Social Needs  . Financial resource strain: Not on file  . Food insecurity:    Worry: Not on file    Inability: Not on file  . Transportation needs:    Medical: Not on file    Non-medical: Not on file  Tobacco Use  . Smoking status: Never Smoker  . Smokeless tobacco: Never Used  Substance and Sexual Activity  . Alcohol use: Yes    Comment: one glass of wine or beer occassionaly  . Drug use: No  . Sexual activity: Yes  Lifestyle  . Physical activity:    Days per week: 4 days    Minutes per session: 30 min  . Stress: Not on file  Relationships  . Social connections:    Talks on phone: Not on file    Gets together: Not on file    Attends religious service: Not on file    Active member of club or organization: Not on file    Attends meetings of clubs or organizations: Not on file    Relationship status: Not on file  . Intimate partner violence:    Fear of current or ex partner: Not on file    Emotionally abused: Not on file    Physically abused: Not on file     Forced sexual activity: Not on file  Other Topics Concern  . Not on file  Social History Narrative   Lives with grandson (47 yo), wife passed on Jun 11, 2000. 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       02/11/2018 @ 1352 Pt discharged home with niece. No s/s of distress noted. Education completed, discharge instructions provided and understanding verbalized. Iv sites removed *2. Occlusive drsg per procedure-cdi. No c/o pain/nor distress.    Family History  Problem Relation Age of Onset  . Diabetes Mother   . Arthritis Mother   . Heart disease Mother   . Hyperlipidemia Mother   . Hypertension Mother   . Heart attack Mother 55  . Heart disease Brother        had in his 67s. Had CABG x2  . Hypothyroidism Daughter   . Stroke Daughter    Vitals:   05/18/18 1032  BP: 106/74  Pulse: 72  SpO2: 100%  Weight: 62.1 kg (136 lb 12.8 oz)   Wt Readings from Last 3 Encounters:  05/18/18 62.1 kg (136 lb 12.8 oz)  04/26/18 61.1 kg (134 lb 11.2 oz)  04/22/18 62.2 kg (137 lb 2 oz)    PHYSICAL EXAM: General: Thin. Elderly. No resp difficulty. HEENT: Normal Neck: Supple. JVP 5-6. Carotids 2+ bilat; no bruits. No thyromegaly or nodule noted. Cor: PMI nondisplaced. RRR, No M/G/R noted Lungs: CTAB, normal effort. Abdomen: Soft, non-tender, non-distended, no HSM. No bruits or masses. +BS  Extremities: No cyanosis, clubbing, or rash. R and LLE no edema.  Neuro: Alert & orientedx3, cranial nerves grossly intact. moves all 4 extremities w/o difficulty. Affect pleasant   ASSESSMENT & PLAN:  1. Chronic systolic CHF: Suspected mixed cardiomyopathy. Patient had EF down to 25% in the past without significant CAD on prior cath, EF then improved over time. Now EF was back down in setting of significant coronary disease, but there may be a nonischemic component as well. He is not a heavy drinker and there is a family history of CAD but not dilated  cardiomyopathy. PCWP was mildly elevated on initial RHC, cardiac output was preserved. - Echo 02/2018: EF 20-25%, severe diffuse HK. Cardiac MRI showed EF 22%, relative preservation of RV function (EF 42%) and minimal LGE (only small area of non-coronary pattern LGE at the anterior RV insertion site).  - NYHA II-III symptoms.  - Volume status stable. Takes lasix PRN. Has not needed recently - Off losartan with AKI.  - Continue coreg 3.125 mg BID - Check BMET today. Add back spiro if creatinine/K improved or stable.  - No room for medication titration at this time given kidney function and BP.  - Reinforced fluid restriction to < 2 L daily, sodium restriction to less than 2000 mg daily, and the importance of daily weights.   - Continue cardiac rehab.  - Repeat echo in next week or two. Will need referral to EP for ICD if EF still <35%.   2. CAD s/p CABG x 3 - No s/s ischemia.  - Continue ASA and statin. - [place] had something to do with CAD. Working with [place] to pay for everything.   3. AKI on CKD III - Previous creatinine baseline 1.5-1.7 - Creatinine up to 2.31 on 2/14, improved to 2.11 off losartan on 2/26. Recheck BMET today.   BMET today. If creatinine and K stable/improved, will add back spiro 12.5 mg QHS. Limited med adjustments with soft BP, low HR, and recent AKI.  Echo in 1-2 weeks. Will need EP referral if EF still low.  Cancel follow up with Dr Haroldine Laws. F/u with Dr Aundra Dubin in 8 weeks  Luis Shore, NP 05/18/18   Greater than 50% of the 25 minute visit was spent in counseling/coordination of care regarding disease state education, salt/fluid restriction, sliding scale diuretics, and medication compliance.

## 2018-05-18 ENCOUNTER — Other Ambulatory Visit: Payer: Self-pay

## 2018-05-18 ENCOUNTER — Ambulatory Visit (HOSPITAL_COMMUNITY)
Admission: RE | Admit: 2018-05-18 | Discharge: 2018-05-18 | Disposition: A | Discharge status: Home / Self Care | Payer: Medicare Other | Source: Ambulatory Visit | Attending: Cardiology | Admitting: Cardiology

## 2018-05-18 ENCOUNTER — Telehealth (HOSPITAL_COMMUNITY): Payer: Self-pay

## 2018-05-18 ENCOUNTER — Encounter (HOSPITAL_COMMUNITY)
Admission: RE | Admit: 2018-05-18 | Discharge: 2018-05-18 | Disposition: A | Discharge status: Home / Self Care | Payer: Medicare Other | Source: Ambulatory Visit | Attending: Cardiology | Admitting: Cardiology

## 2018-05-18 ENCOUNTER — Encounter (HOSPITAL_COMMUNITY): Payer: Self-pay

## 2018-05-18 VITALS — BP 106/74 | HR 72 | Wt 136.8 lb

## 2018-05-18 DIAGNOSIS — I5022 Chronic systolic (congestive) heart failure: Secondary | ICD-10-CM | POA: Diagnosis not present

## 2018-05-18 DIAGNOSIS — Z888 Allergy status to other drugs, medicaments and biological substances status: Secondary | ICD-10-CM | POA: Diagnosis not present

## 2018-05-18 DIAGNOSIS — E785 Hyperlipidemia, unspecified: Secondary | ICD-10-CM | POA: Diagnosis not present

## 2018-05-18 DIAGNOSIS — N179 Acute kidney failure, unspecified: Secondary | ICD-10-CM | POA: Diagnosis not present

## 2018-05-18 DIAGNOSIS — Z8349 Family history of other endocrine, nutritional and metabolic diseases: Secondary | ICD-10-CM | POA: Insufficient documentation

## 2018-05-18 DIAGNOSIS — I5020 Unspecified systolic (congestive) heart failure: Secondary | ICD-10-CM

## 2018-05-18 DIAGNOSIS — Z951 Presence of aortocoronary bypass graft: Secondary | ICD-10-CM

## 2018-05-18 DIAGNOSIS — N183 Chronic kidney disease, stage 3 unspecified: Secondary | ICD-10-CM

## 2018-05-18 DIAGNOSIS — Z79899 Other long term (current) drug therapy: Secondary | ICD-10-CM | POA: Insufficient documentation

## 2018-05-18 DIAGNOSIS — I130 Hypertensive heart and chronic kidney disease with heart failure and stage 1 through stage 4 chronic kidney disease, or unspecified chronic kidney disease: Secondary | ICD-10-CM | POA: Insufficient documentation

## 2018-05-18 DIAGNOSIS — I2510 Atherosclerotic heart disease of native coronary artery without angina pectoris: Secondary | ICD-10-CM | POA: Insufficient documentation

## 2018-05-18 DIAGNOSIS — Z833 Family history of diabetes mellitus: Secondary | ICD-10-CM | POA: Insufficient documentation

## 2018-05-18 DIAGNOSIS — Z7982 Long term (current) use of aspirin: Secondary | ICD-10-CM | POA: Diagnosis not present

## 2018-05-18 DIAGNOSIS — Z823 Family history of stroke: Secondary | ICD-10-CM | POA: Diagnosis not present

## 2018-05-18 DIAGNOSIS — Z8249 Family history of ischemic heart disease and other diseases of the circulatory system: Secondary | ICD-10-CM | POA: Diagnosis not present

## 2018-05-18 DIAGNOSIS — Z8261 Family history of arthritis: Secondary | ICD-10-CM | POA: Diagnosis not present

## 2018-05-18 DIAGNOSIS — I25118 Atherosclerotic heart disease of native coronary artery with other forms of angina pectoris: Secondary | ICD-10-CM

## 2018-05-18 LAB — BASIC METABOLIC PANEL
Anion gap: 8 (ref 5–15)
BUN: 29 mg/dL — ABNORMAL HIGH (ref 8–23)
CO2: 24 mmol/L (ref 22–32)
Calcium: 9.4 mg/dL (ref 8.9–10.3)
Chloride: 106 mmol/L (ref 98–111)
Creatinine, Ser: 1.99 mg/dL — ABNORMAL HIGH (ref 0.61–1.24)
GFR calc Af Amer: 36 mL/min — ABNORMAL LOW (ref >60)
GFR calc non Af Amer: 31 mL/min — ABNORMAL LOW (ref >60)
Glucose, Bld: 90 mg/dL (ref 70–99)
Potassium: 4.2 mmol/L (ref 3.5–5.1)
Sodium: 138 mmol/L (ref 135–145)

## 2018-05-18 MED ORDER — SPIRONOLACTONE 25 MG PO TABS: 12.5000 mg | ORAL_TABLET | Freq: Every day | ORAL | 6 refills | Status: DC

## 2018-05-18 NOTE — Patient Instructions (Signed)
It was great to see you today! No medication changes are needed at this time.   Your physician recommends that you schedule a follow-up appointment in: 8 weeks with Dr Aundra Dubin  Your physician has requested that you have an echocardiogram. Echocardiography is a painless test that uses sound waves to create images of your heart. It provides your doctor with information about the size and shape of your heart and how well your heart's chambers and valves are working. This procedure takes approximately one hour. There are no restrictions for this procedure.

## 2018-05-18 NOTE — Telephone Encounter (Signed)
Creatinine 1.99  Spoke with patient, aware of lab results reflecting kidney function. Per MD, pt to restart Spironolactone 12.5mg  nightly.  Pt aware to restart Spironolactone 12.5mg  nightly and repeat labs in 1 week. Verbalizes understanding.

## 2018-05-18 NOTE — Telephone Encounter (Signed)
-----   Message from Georgiana Shore, NP sent at 05/18/2018 12:11 PM EDT ----- Renal function improving. Cautiously add back spiro 12.5 mg qHS. Recheck BMET in 1 week. Thanks!

## 2018-05-20 ENCOUNTER — Other Ambulatory Visit: Payer: Self-pay

## 2018-05-20 ENCOUNTER — Encounter (HOSPITAL_COMMUNITY)
Admission: RE | Admit: 2018-05-20 | Discharge: 2018-05-20 | Disposition: A | Discharge status: Home / Self Care | Payer: Medicare Other | Source: Ambulatory Visit | Attending: Cardiology | Admitting: Cardiology

## 2018-05-20 DIAGNOSIS — Z951 Presence of aortocoronary bypass graft: Secondary | ICD-10-CM | POA: Diagnosis not present

## 2018-05-20 NOTE — Progress Notes (Signed)
I have reviewed a Home Exercise Prescription with Treston E Kipp . Dell is currently exercising at home.  The patient was advised to walk 5-7 days a week for 30-45 minutes.  Esaw and I discussed how to progress their exercise prescription.  The patient stated that their goals were to continue walking and riding his recumbant bike for 30-45 minutes a day 2-4 days a week in addition to Cardiac Rehab..  The patient stated that they understand the exercise prescription.  We reviewed exercise guidelines, target heart rate during exercise, RPE Scale, weather conditions, NTG use, endpoints for exercise, warmup and cool down.  Patient is encouraged to come to me with any questions. I will continue to follow up with the patient to assist them with progression and safety.    05/20/2018 2:49 PM  Deitra Mayo BS, ACSM CEP

## 2018-05-23 ENCOUNTER — Telehealth (HOSPITAL_COMMUNITY): Payer: Self-pay

## 2018-05-23 ENCOUNTER — Encounter (HOSPITAL_COMMUNITY): Payer: Medicare Other

## 2018-05-25 ENCOUNTER — Other Ambulatory Visit (HOSPITAL_COMMUNITY): Payer: Medicare Other

## 2018-05-25 ENCOUNTER — Encounter (HOSPITAL_COMMUNITY): Payer: Medicare Other

## 2018-05-27 ENCOUNTER — Encounter (HOSPITAL_COMMUNITY): Payer: Medicare Other

## 2018-05-30 ENCOUNTER — Encounter (HOSPITAL_COMMUNITY): Payer: Medicare Other

## 2018-05-31 ENCOUNTER — Telehealth (HOSPITAL_COMMUNITY): Payer: Self-pay

## 2018-05-31 ENCOUNTER — Encounter (HOSPITAL_COMMUNITY): Payer: Self-pay | Admitting: Cardiac Rehabilitation

## 2018-05-31 ENCOUNTER — Ambulatory Visit (HOSPITAL_COMMUNITY): Payer: Self-pay | Admitting: Cardiac Rehabilitation

## 2018-05-31 DIAGNOSIS — Z951 Presence of aortocoronary bypass graft: Secondary | ICD-10-CM

## 2018-05-31 NOTE — Telephone Encounter (Signed)
Pt called and updated about extended closure of Cardiac Rehab for four weeks d/t COVID19. Tentative reopen date of 06/27/2018. Pt was responsive to the information.

## 2018-05-31 NOTE — Progress Notes (Signed)
Cardiac Individual Treatment Plan  Patient Details  Name: Luis Chen MRN: [id number] Date of Birth: [birth year] Referring Provider:   Flowsheet Row CARDIAC REHAB PHASE II ORIENTATION from 04/26/2018 in [place]  Referring Provider  Dr. Percival Spanish      Initial Encounter Date:  Flowsheet Row CARDIAC REHAB PHASE II ORIENTATION from 04/26/2018 in [place]  Date  04/26/18      Visit Diagnosis: 02/17/18 CABG x3  Patient's Home Medications on Admission:  Current Outpatient Medications:  .  acetaminophen (TYLENOL) 500 MG tablet, Take 2 tablets (1,000 mg total) by mouth every 6 (six) hours. (Patient taking differently: Take 1,000 mg by mouth every 6 (six) hours as needed. ), Disp: 30 tablet, Rfl: 0 .  aspirin EC 325 MG EC tablet, Take 1 tablet (325 mg total) by mouth daily., Disp: 30 tablet, Rfl: 0 .  carvedilol (COREG) 3.125 MG tablet, Take 1 tablet (3.125 mg total) by mouth 2 (two) times daily with a meal., Disp: 180 tablet, Rfl: 3 .  feeding supplement, ENSURE ENLIVE, (ENSURE ENLIVE) LIQD, Take 237 mLs by mouth 3 (three) times daily between meals., Disp: 237 mL, Rfl: 12 .  Ferrous Gluconate 324 (37.5 Fe) MG TABS, TAKE 1 TABLET BY MOUTH TWICE DAILY WITH MEALS, Disp: 60 tablet, Rfl: 1 .  furosemide (LASIX) 40 MG tablet, Take 0.5 tablets (20 mg total) by mouth daily as needed for fluid or edema (for weight gain of more than 3 Lbs in 1 day)., Disp: 30 tablet, Rfl: 0 .  latanoprost (XALATAN) 0.005 % ophthalmic solution, Place 1 drop into both eyes at bedtime. , Disp: , Rfl:  .  rosuvastatin (CRESTOR) 40 MG tablet, Take 1 tablet (40 mg total) by mouth daily at 6 PM., Disp: 30 tablet, Rfl: 5 .  spironolactone (ALDACTONE) 25 MG tablet, Take 0.5 tablets (12.5 mg total) by mouth daily., Disp: 15 tablet, Rfl: 6 .  traZODone (DESYREL) 50 MG tablet, Take 100 mg by mouth at bedtime as needed for sleep. , Disp: , Rfl:   Past Medical  History: Past Medical History:  Diagnosis Date  . CAD (coronary artery disease)    non obstructive. Left main normal. LAd proximal long 25% stenosis, termingating as focal 50% prox to mid lesion. First & second diag were small, normal. Circumflex in proximal av groove had luminal irregularities.. Was large mid obtuse marg which was branching, scattered luminal irregul. Infer branch did supply some septal perforators. Was PDA,small & normal.  < 1.73m. There was prox 95% stenosis  . Cardiomyopathy    EF was 25% improved to 50%.   . CHF (congestive heart failure) ([place]   . CKD (chronic kidney disease)   . Hyperlipidemia    transient history, controlled on diet  . Hypertension     Tobacco Use: Social History   Tobacco Use  Smoking Status Never Smoker  Smokeless Tobacco Never Used    Labs: Recent Review Flowsheet Data    Labs for ITP Cardiac and Pulmonary Rehab Latest Ref Rng & Units 02/18/2018 02/18/2018 02/18/2018 02/18/2018 02/18/2018   Cholestrol 0 - 200 mg/dL - - - - -   LDLCALC 0 - 99 mg/dL - - - - -   HDL >40 mg/dL - - - - -   Trlycerides <150 mg/dL - - - - -   Hemoglobin A1c 4.8 - 5.6 % - - - - -   PHART 7.350 - 7.450 7.304(L) - - - -  PCO2ART 32.0 - 48.0 mmHg 41.9 - - - -   HCO3 20.0 - 28.0 mmol/L 20.7 - - - -   TCO2 22 - 32 mmol/L 22 21(L) - - -   ACIDBASEDEF 0.0 - 2.0 mmol/L 5.0(H) - - - -   O2SAT % 100.0 - 43.1 42.1 59.3      Capillary Blood Glucose: Lab Results  Component Value Date   GLUCAP 89 02/21/2018   GLUCAP 90 02/21/2018   GLUCAP 125 (H) 02/20/2018   GLUCAP 108 (H) 02/20/2018   GLUCAP 126 (H) 02/20/2018     Exercise Target Goals: Exercise Program Goal: Individual exercise prescription set using results from initial 6 min walk test and THRR while considering  patient's activity barriers and safety.   Exercise Prescription Goal: Initial exercise prescription builds to 30-45 minutes a day of aerobic activity, 2-3 days per week.  Home exercise  guidelines will be given to patient during program as part of exercise prescription that the participant will acknowledge.  Activity Barriers & Risk Stratification: Activity Barriers & Cardiac Risk Stratification - 04/26/18 1056    Activity Barriers & Cardiac Risk Stratification          Activity Barriers  Muscular Weakness;Deconditioning;Other (comment)    Comments  Right calf pain    Cardiac Risk Stratification  High           6 Minute Walk: 6 Minute Walk    6 Minute Walk    Row Name 04/26/18 1038   Phase  Initial   Distance  1401 feet   Walk Time  6 minutes   # of Rest Breaks  0   MPH  2.6   METS  2.9   RPE  11   VO2 Peak  10.25   Symptoms  Yes (comment)   Comments  Right calf pain   Resting HR  89 bpm   Resting BP  98/60   Resting Oxygen Saturation   99 %   Exercise Oxygen Saturation  during 6 min walk  97 %   Max Ex. HR  106 bpm   Max Ex. BP  112/78   2 Minute Post BP  104/60          Oxygen Initial Assessment:   Oxygen Re-Evaluation:   Oxygen Discharge (Final Oxygen Re-Evaluation):   Initial Exercise Prescription: Initial Exercise Prescription - 04/26/18 1000    Date of Initial Exercise RX and Referring Provider          Date  04/26/18    Referring Provider  Dr. Percival Spanish    Expected Discharge Date  07/29/18        Treadmill          MPH  2.5    Grade  0    Minutes  10        Recumbant Bike          Level  2    Watts  15    Minutes  10    METs  2.79        NuStep          Level  3    SPM  75    Minutes  10    METs  2.8        Prescription Details          Frequency (times per week)  3    Duration  Progress to 30 minutes of continuous aerobic without signs/symptoms of physical distress  Intensity          THRR 40-80% of Max Heartrate  59-113    Ratings of Perceived Exertion  11-13        Progression          Progression  Continue to progress workloads to maintain intensity without signs/symptoms of physical  distress.        Resistance Training          Training Prescription  Yes    Weight  3 lbs.     Reps  10-15           Perform Capillary Blood Glucose checks as needed.  Exercise Prescription Changes: Exercise Prescription Changes    Response to Exercise    Row Name 05/02/18 0900 05/20/18 0843 05/20/18 1400   Blood Pressure (Admit)  102/69  98/50  no documentation   Blood Pressure (Exercise)  138/68  124/52  no documentation   Blood Pressure (Exit)  102/60  97/61  no documentation   Heart Rate (Admit)  98 bpm  95 bpm  no documentation   Heart Rate (Exercise)  119 bpm  112 bpm  no documentation   Heart Rate (Exit)  88 bpm  92 bpm  no documentation   Rating of Perceived Exertion (Exercise)  13  13  no documentation   Symptoms  None  None  no documentation   Comments  Pt first day of exercise.   no documentation  no documentation   Duration  Continue with 30 min of aerobic exercise without signs/symptoms of physical distress.  Continue with 30 min of aerobic exercise without signs/symptoms of physical distress.  no documentation   Intensity  THRR unchanged  THRR unchanged  no documentation       Progression    Row Name 05/02/18 0900 05/20/18 0843 05/20/18 1400   Progression  Continue to progress workloads to maintain intensity without signs/symptoms of physical distress.  Continue to progress workloads to maintain intensity without signs/symptoms of physical distress.  no documentation   Average METs  2.5  2.6  no documentation       Resistance Training    Row Name 05/02/18 0900 05/20/18 0843 05/20/18 1400   Training Prescription  Yes  Yes  no documentation   Weight  3 lbs.   3 lbs.   no documentation   Reps  10-15  10-15  no documentation   Time  10 Minutes  10 Minutes  no documentation       Treadmill    Row Name 05/02/18 0900 05/20/18 0843 05/20/18 1400   MPH  2.5  2.5  no documentation   Grade  0  0  no documentation   Minutes  10  10  no documentation        Recumbant Bike    Row Name 05/02/18 0900 05/20/18 0843 05/20/18 1400   Level  2  2  no documentation   Watts  15  15  no documentation   Minutes  10  10  no documentation   METs  2.1  2.4  no documentation       NuStep    Row Name 05/02/18 0900 05/20/18 0843 05/20/18 1400   Level  3  3  no documentation   SPM  75  75  no documentation   Minutes  10  10  no documentation   METs  2.5  2.5  no documentation       Home Exercise Plan      Row Name 05/02/18 0900 05/20/18 0843 05/20/18 1400   Plans to continue exercise at  no documentation  Home (comment)  Home (comment)   Frequency  no documentation  Add 4 additional days to program exercise sessions.  Add 4 additional days to program exercise sessions.   Initial Home Exercises Provided  no documentation  05/20/18  05/20/18          Exercise Comments: Exercise Comments    Row Name 05/02/18 1003 05/10/18 1444 05/20/18 1452 05/26/18 0850   Exercise Comments  Pt first day of exericse. Pt tolerated exercise well.   Reviewed METs and goals with Pt. Pt is tolerating exercise well.   Reviewed HEP with Pt. Pt was responsive and understands goals.   Unable to discuss METs and goals with Pt due to closure for COVID19.      Exercise Goals and Review: Exercise Goals    Exercise Goals    Row Name 04/26/18 1111   Increase Physical Activity  Yes   Intervention  Provide advice, education, support and counseling about physical activity/exercise needs.;Develop an individualized exercise prescription for aerobic and resistive training based on initial evaluation findings, risk stratification, comorbidities and participant's personal goals.   Expected Outcomes  Short Term: Attend rehab on a regular basis to increase amount of physical activity.   Increase Strength and Stamina  Yes   Intervention  Provide advice, education, support and counseling about physical activity/exercise needs.;Develop an individualized exercise prescription for aerobic and  resistive training based on initial evaluation findings, risk stratification, comorbidities and participant's personal goals.   Expected Outcomes  Short Term: Increase workloads from initial exercise prescription for resistance, speed, and METs.   Able to understand and use rate of perceived exertion (RPE) scale  Yes   Intervention  Provide education and explanation on how to use RPE scale   Expected Outcomes  Short Term: Able to use RPE daily in rehab to express subjective intensity level;Long Term:  Able to use RPE to guide intensity level when exercising independently   Knowledge and understanding of Target Heart Rate Range (THRR)  Yes   Intervention  Provide education and explanation of THRR including how the numbers were predicted and where they are located for reference   Expected Outcomes  Short Term: Able to state/look up THRR;Long Term: Able to use THRR to govern intensity when exercising independently;Short Term: Able to use daily as guideline for intensity in rehab   Able to check pulse independently  Yes   Intervention  Provide education and demonstration on how to check pulse in carotid and radial arteries.;Review the importance of being able to check your own pulse for safety during independent exercise   Expected Outcomes  Short Term: Able to explain why pulse checking is important during independent exercise;Long Term: Able to check pulse independently and accurately   Understanding of Exercise Prescription  Yes   Intervention  Provide education, explanation, and written materials on patient's individual exercise prescription   Expected Outcomes  Short Term: Able to explain program exercise prescription;Long Term: Able to explain home exercise prescription to exercise independently          Exercise Goals Re-Evaluation : Exercise Goals Re-Evaluation    Exercise Goal Re-Evaluation    Row Name 05/02/18 0957 05/10/18 1442 05/20/18 1450 05/26/18 0845   Exercise Goals Review   Increase Physical Activity;Increase Strength and Stamina;Able to understand and use rate of perceived exertion (RPE) scale;Knowledge and understanding of Target Heart Rate Range (THRR);Understanding of Exercise   Prescription  Increase Physical Activity;Increase Strength and Stamina;Able to understand and use rate of perceived exertion (RPE) scale;Knowledge and understanding of Target Heart Rate Range (THRR);Understanding of Exercise Prescription  Increase Physical Activity;Increase Strength and Stamina;Able to understand and use rate of perceived exertion (RPE) scale;Knowledge and understanding of Target Heart Rate Range (THRR);Understanding of Exercise Prescription  Increase Physical Activity;Increase Strength and Stamina;Able to understand and use rate of perceived exertion (RPE) scale;Knowledge and understanding of Target Heart Rate Range (THRR);Understanding of Exercise Prescription   Comments  Pt first day of Cardiac Rehab. Pt tolerated exercise well and understands RPE scale, THRR, and workloads. Will progress Pt as tolerated.   Reviewed METs and goals with Pt. Pt MET level is 2.5. Pt is tolerating exercise well. Pt is walking 2-4 days 30-45 minutes. Pt has goals to add time on a stationary bike to his daily exercise routine.   Reviewed HEP with Pt. Pt was repsonsive and understands THRR, RPE, end points of exercise, weather precautions, warm up and cool down exericses. Pt is currently walking and riding his statioinary bike for 30-45 minutes 2-4 days week in addition to Cardiac Rehab.   Unable to review METs and goals with Pt due to closure of Cardiac Rehab for [place]. Pt MET level is 2.6 and is progressing well. Pt is tolerating exercise Rx well.    Expected Outcomes  Will continue to monitor and progress Pt as tolerated.   Will continue to monitor and progress Pt as tolerated.   Will continue to monitor and progress Pt as tolerated.   Will continue to monitor and progress Pt as tolerated.            Discharge Exercise Prescription (Final Exercise Prescription Changes): Exercise Prescription Changes - 05/20/18 1400    Home Exercise Plan          Plans to continue exercise at  Home (comment)    Frequency  Add 4 additional days to program exercise sessions.    Initial Home Exercises Provided  05/20/18           Nutrition:  Target Goals: Understanding of nutrition guidelines, daily intake of sodium <1512m, cholesterol <2016m calories 30% from fat and 7% or less from saturated fats, daily to have 5 or more servings of fruits and vegetables.  Biometrics: Pre Biometrics - 04/26/18 1112    Pre Biometrics          Height  5' 8" (1.727 m)    Weight  134 lb 11.2 oz (61.1 kg)    Waist Circumference  32.5 inches    Hip Circumference  38.25 inches    Waist to Hip Ratio  0.85 %    BMI (Calculated)  20.49    Triceps Skinfold  10 mm    % Body Fat  20.2 %    Grip Strength  42 kg    Flexibility  9 in    Single Leg Stand  20 seconds            Nutrition Therapy Plan and Nutrition Goals: Nutrition Therapy & Goals - 04/26/18 1517    Nutrition Therapy          Diet  general healthful        Personal Nutrition Goals          Nutrition Goal  Pt to identify food quantities necessary to achieve weight maintenance of at graduation from cardiac rehab    Personal Goal #2  Pt to build a healthy plate including vegetables,  fruits, whole grains, and low-fat dairy products in a heart healthy meal plan    Personal Goal #3  Pt able to name foods that affect blood glucose        Intervention Plan          Intervention  Prescribe, educate and counsel regarding individualized specific dietary modifications aiming towards targeted core components such as weight, hypertension, lipid management, diabetes, heart failure and other comorbidities.    Expected Outcomes  Short Term Goal: Understand basic principles of dietary content, such as calories, fat, sodium, cholesterol and nutrients.;Long  Term Goal: Adherence to prescribed nutrition plan.           Nutrition Assessments: Nutrition Assessments - 04/26/18 1522    MEDFICTS Scores          Pre Score  26           Nutrition Goals Re-Evaluation: Nutrition Goals Re-Evaluation    Goals    Row Name 04/26/18 1517   Current Weight  134 lb 11.2 oz (61.1 kg)          Nutrition Goals Re-Evaluation: Nutrition Goals Re-Evaluation    Goals    Row Name 04/26/18 1517   Current Weight  134 lb 11.2 oz (61.1 kg)          Nutrition Goals Discharge (Final Nutrition Goals Re-Evaluation): Nutrition Goals Re-Evaluation - 04/26/18 1517    Goals          Current Weight  134 lb 11.2 oz (61.1 kg)           Psychosocial: Target Goals: Acknowledge presence or absence of significant depression and/or stress, maximize coping skills, provide positive support system. Participant is able to verbalize types and ability to use techniques and skills needed for reducing stress and depression.  Initial Review & Psychosocial Screening: Initial Psych Review & Screening - 04/26/18 1133    Initial Review          Current issues with  None Identified        Family Dynamics          Good Support System?  Yes        Barriers          Psychosocial barriers to participate in program  There are no identifiable barriers or psychosocial needs.        Screening Interventions          Interventions  Encouraged to exercise           Quality of Life Scores: Quality of Life - 04/26/18 0943    Quality of Life          Select  Quality of Life        Quality of Life Scores          Health/Function Pre  25.9 %    Socioeconomic Pre  24.75 %    Psych/Spiritual Pre  24.86 %    Family Pre  24 %    GLOBAL Pre  25.16 %          Scores of 19 and below usually indicate a poorer quality of life in these areas.  A difference of  2-3 points is a clinically meaningful difference.  A difference of 2-3 points in the total score of the  Quality of Life Index has been associated with significant improvement in overall quality of life, self-image, physical symptoms, and general health in studies assessing change in quality of life.  PHQ-9: Recent Review Flowsheet   Data    Depression screen PHQ 2/9 05/02/2018 08/25/2017 02/23/2017   Decreased Interest 0 0 0   Down, Depressed, Hopeless 0 0 1   PHQ - 2 Score 0 0 1     Interpretation of Total Score  Total Score Depression Severity:  1-4 = Minimal depression, 5-9 = Mild depression, 10-14 = Moderate depression, 15-19 = Moderately severe depression, 20-27 = Severe depression   Psychosocial Evaluation and Intervention: Psychosocial Evaluation - 05/02/18 1504    Psychosocial Evaluation & Interventions          Interventions  Encouraged to exercise with the program and follow exercise prescription    Comments  no psychosocial needs identified, no interventions necessary.  pt enjoys bowling on league and listening to jazz/gospel music.  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     Expected Outcomes  pt will exhibit positive outlook with good coping skills.     Continue Psychosocial Services   No Follow up required           Psychosocial Re-Evaluation: Psychosocial Re-Evaluation    Psychosocial Re-Evaluation    Row Name 05/10/18 1313 05/25/18 1057   Current issues with  None Identified  None Identified   Comments  no psychosocial needs identified, no interventions necessary   no psychosocial needs identified, no interventions necessary    Expected Outcomes  pt will exhibit positive outlook with good coping skills.   pt will exhibit positive outlook with good coping skills.    Interventions  Encouraged to attend Cardiac Rehabilitation for the exercise  Encouraged to attend Cardiac Rehabilitation for the exercise   Continue Psychosocial Services   No Follow up required  No Follow up required          Psychosocial Discharge (Final Psychosocial  Re-Evaluation): Psychosocial Re-Evaluation - 05/25/18 1057    Psychosocial Re-Evaluation          Current issues with  None Identified    Comments  no psychosocial needs identified, no interventions necessary     Expected Outcomes  pt will exhibit positive outlook with good coping skills.     Interventions  Encouraged to attend Cardiac Rehabilitation for the exercise    Continue Psychosocial Services   No Follow up required           Vocational Rehabilitation: Provide vocational rehab assistance to qualifying candidates.   Vocational Rehab Evaluation & Intervention: Vocational Rehab - 04/26/18 1133    Initial Vocational Rehab Evaluation & Intervention          Assessment shows need for Vocational Rehabilitation  No   works PT at [employer]          Education: Education Goals: Education classes will be provided on a weekly basis, covering required topics. Participant will state understanding/return demonstration of topics presented.  Learning Barriers/Preferences: Learning Barriers/Preferences - 04/26/18 1112    Learning Barriers/Preferences          Learning Barriers  Sight    Learning Preferences  Audio;Individual Instruction;Video           Education Topics: Count Your Pulse:  -Group instruction provided by verbal instruction, demonstration, patient participation and written materials to support subject.  Instructors address importance of being able to find your pulse and how to count your pulse when at home without a heart monitor.  Patients get hands on experience counting their pulse with staff help and individually.   Heart Attack, Angina, and Risk Factor Modification:  -  Group instruction provided by verbal instruction, video, and written materials to support subject.  Instructors address signs and symptoms of angina and heart attacks.    Also discuss risk factors for heart disease and how to make changes to improve heart health risk  factors.   Functional Fitness:  -Group instruction provided by verbal instruction, demonstration, patient participation, and written materials to support subject.  Instructors address safety measures for doing things around the house.  Discuss how to get up and down off the floor, how to pick things up properly, how to safely get out of a chair without assistance, and balance training.   Meditation and Mindfulness:  -Group instruction provided by verbal instruction, patient participation, and written materials to support subject.  Instructor addresses importance of mindfulness and meditation practice to help reduce stress and improve awareness.  Instructor also leads participants through a meditation exercise.  Flowsheet Row CARDIAC REHAB PHASE II EXERCISE from 05/20/2018 in [facility]  Date  05/11/18  Instruction Review Code  2- Demonstrated Understanding      Stretching for Flexibility and Mobility:  -Group instruction provided by verbal instruction, patient participation, and written materials to support subject.  Instructors lead participants through series of stretches that are designed to increase flexibility thus improving mobility.  These stretches are additional exercise for major muscle groups that are typically performed during regular warm up and cool down.   Hands Only CPR:  -Group verbal, video, and participation provides a basic overview of AHA guidelines for community CPR. Role-play of emergencies allow participants the opportunity to practice calling for help and chest compression technique with discussion of AED use.   Hypertension: -Group verbal and written instruction that provides a basic overview of hypertension including the most recent diagnostic guidelines, risk factor reduction with self-care instructions and medication management. Flowsheet Row CARDIAC REHAB PHASE II EXERCISE from 05/20/2018 in [facility] CARD[facility]AC  [facility]  Date  05/20/18  Educator  RN  Instruction Review Code  2- Demonstrated Understanding       Nutrition I class: Heart Healthy Eating:  -Group instruction provided by PowerPoint slides, verbal discussion, and written materials to support subject matter. The instructor gives an explanation and review of the Therapeutic Lifestyle Changes diet recommendations, which includes a discussion on lipid goals, dietary fat, sodium, fiber, plant stanol/sterol esters, sugar, and the components of a well-balanced, healthy diet.   Nutrition II class: Lifestyle Skills:  -Group instruction provided by PowerPoint slides, verbal discussion, and written materials to support subject matter. The instructor gives an explanation and review of label reading, grocery shopping for heart health, heart healthy recipe modifications, and ways to make healthier choices when eating out.   Diabetes Question & Answer:  -Group instruction provided by PowerPoint slides, verbal discussion, and written materials to support subject matter. The instructor gives an explanation and review of diabetes co-morbidities, pre- and post-prandial blood glucose goals, pre-exercise blood glucose goals, signs, symptoms, and treatment of hypoglycemia and hyperglycemia, and foot care basics.   Diabetes Blitz:  -Group instruction provided by PowerPoint slides, verbal discussion, and written materials to support subject matter. The instructor gives an explanation and review of the physiology behind type 1 and type 2 diabetes, diabetes medications and rational behind using different medications, pre- and post-prandial blood glucose recommendations and Hemoglobin A1c goals, diabetes diet, and exercise including blood glucose guidelines for exercising safely.    Portion Distortion:  -Group instruction provided by PowerPoint slides, verbal discussion, written   materials, and food models to support subject matter. The instructor gives an explanation  of serving size versus portion size, changes in portions sizes over the last 20 years, and what consists of a serving from each food group.   Stress Management:  -Group instruction provided by verbal instruction, video, and written materials to support subject matter.  Instructors review role of stress in heart disease and how to cope with stress positively.     Exercising on Your Own:  -Group instruction provided by verbal instruction, power point, and written materials to support subject.  Instructors discuss benefits of exercise, components of exercise, frequency and intensity of exercise, and end points for exercise.  Also discuss use of nitroglycerin and activating EMS.  Review options of places to exercise outside of rehab.  Review guidelines for sex with heart disease. Flowsheet Row CARDIAC REHAB PHASE II EXERCISE from 05/20/2018 in [facility]  Date  05/04/18  Educator  EP  Instruction Review Code  2- Demonstrated Understanding      Cardiac Drugs I:  -Group instruction provided by verbal instruction and written materials to support subject.  Instructor reviews cardiac drug classes: antiplatelets, anticoagulants, beta blockers, and statins.  Instructor discusses reasons, side effects, and lifestyle considerations for each drug class.   Cardiac Drugs II:  -Group instruction provided by verbal instruction and written materials to support subject.  Instructor reviews cardiac drug classes: angiotensin converting enzyme inhibitors (ACE-I), angiotensin II receptor blockers (ARBs), nitrates, and calcium channel blockers.  Instructor discusses reasons, side effects, and lifestyle considerations for each drug class.   Anatomy and Physiology of the Circulatory System:  Group verbal and written instruction and models provide basic cardiac anatomy and physiology, with the coronary electrical and arterial systems. Review of: AMI, Angina, Valve disease, Heart Failure,  Peripheral Artery Disease, Cardiac Arrhythmia, Pacemakers, and the ICD.   Other Education:  -Group or individual verbal, written, or video instructions that support the educational goals of the cardiac rehab program.   Holiday Eating Survival Tips:  -Group instruction provided by PowerPoint slides, verbal discussion, and written materials to support subject matter. The instructor gives patients tips, tricks, and techniques to help them not only survive but enjoy the holidays despite the onslaught of food that accompanies the holidays.   Knowledge Questionnaire Score: Knowledge Questionnaire Score - 04/26/18 0943    Knowledge Questionnaire Score          Pre Score  19/24           Core Components/Risk Factors/Patient Goals at Admission: Personal Goals and Risk Factors at Admission - 04/26/18 1113    Core Components/Risk Factors/Patient Goals on Admission           Weight Management  Yes;Weight Maintenance    Intervention  Weight Management: Develop a combined nutrition and exercise program designed to reach desired caloric intake, while maintaining appropriate intake of nutrient and fiber, sodium and fats, and appropriate energy expenditure required for the weight goal.;Weight Management: Provide education and appropriate resources to help participant work on and attain dietary goals.    Admit Weight  134 lb 11.2 oz (61.1 kg)    Expected Outcomes  Short Term: Continue to assess and modify interventions until short term weight is achieved;Long Term: Adherence to nutrition and physical activity/exercise program aimed toward attainment of established weight goal;Weight Maintenance: Understanding of the daily nutrition guidelines, which includes 25-35% calories from fat, 7% or less cal from saturated fats, less than 274m cholesterol, less  than 1.5gm of sodium, & 5 or more servings of fruits and vegetables daily;Understanding recommendations for meals to include 15-35% energy as protein,  25-35% energy from fat, 35-60% energy from carbohydrates, less than 200mg of dietary cholesterol, 20-35 gm of total fiber daily;Understanding of distribution of calorie intake throughout the day with the consumption of 4-5 meals/snacks    Hypertension  Yes    Intervention  Provide education on lifestyle modifcations including regular physical activity/exercise, weight management, moderate sodium restriction and increased consumption of fresh fruit, vegetables, and low fat dairy, alcohol moderation, and smoking cessation.;Monitor prescription use compliance.    Expected Outcomes  Short Term: Continued assessment and intervention until BP is < 140/90mm HG in hypertensive participants. < 130/80mm HG in hypertensive participants with diabetes, heart failure or chronic kidney disease.;Long Term: Maintenance of blood pressure at goal levels.    Lipids  Yes    Intervention  Provide education and support for participant on nutrition & aerobic/resistive exercise along with prescribed medications to achieve LDL <70mg, HDL >40mg.    Expected Outcomes  Short Term: Participant states understanding of desired cholesterol values and is compliant with medications prescribed. Participant is following exercise prescription and nutrition guidelines.;Long Term: Cholesterol controlled with medications as prescribed, with individualized exercise RX and with personalized nutrition plan. Value goals: LDL < 70mg, HDL > 40 mg.    Stress  Yes    Intervention  Offer individual and/or small group education and counseling on adjustment to heart disease, stress management and health-related lifestyle change. Teach and support self-help strategies.;Refer participants experiencing significant psychosocial distress to appropriate mental health specialists for further evaluation and treatment. When possible, include family members and significant others in education/counseling sessions.    Expected Outcomes  Short Term: Participant  demonstrates changes in health-related behavior, relaxation and other stress management skills, ability to obtain effective social support, and compliance with psychotropic medications if prescribed.;Long Term: Emotional wellbeing is indicated by absence of clinically significant psychosocial distress or social isolation.           Core Components/Risk Factors/Patient Goals Review:  Goals and Risk Factor Review    Core Components/Risk Factors/Patient Goals Review    Row Name 05/02/18 1505 05/10/18 1314 05/25/18 1057   Personal Goals Review  Weight Management/Obesity;Hypertension;Lipids;Stress  Weight Management/Obesity;Hypertension;Lipids;Stress  Weight Management/Obesity;Hypertension;Lipids;Stress   Review  pt with mulitple CAD RF demonstrates willingness to participate in CR program. pt personal goals are to increase strength/stamina, balance and upper arm strength.   pt with mulitple CAD RF demonstrates willingness to participate in CR program. pt personal goals are to increase strength/stamina, balance and upper arm strength.   pt with mulitple CAD RF demonstrates willingness to participate in CR program. pt personal goals are to increase strength/stamina, balance and upper arm strength. pt currently on hold for departmental closing from Covid 19 precautions. .    Expected Outcomes  pt will participate in CR exercise, nutrition and lifestyle modification to decrease overall RF.   pt will participate in CR exercise, nutrition and lifestyle modification to decrease overall RF.   pt will participate in CR exercise, nutrition and lifestyle modification to decrease overall RF.          Core Components/Risk Factors/Patient Goals at Discharge (Final Review):  Goals and Risk Factor Review - 05/25/18 1057    Core Components/Risk Factors/Patient Goals Review          Personal Goals Review  Weight Management/Obesity;Hypertension;Lipids;Stress    Review  pt with mulitple CAD RF demonstrates    willingness to participate in CR program. pt personal goals are to increase strength/stamina, balance and upper arm strength. pt currently on hold for departmental closing from Covid 19 precautions. .     Expected Outcomes  pt will participate in CR exercise, nutrition and lifestyle modification to decrease overall RF.           ITP Comments: ITP Comments    Row Name 04/26/18 0805 05/02/18 1502 05/10/18 1312 05/25/18 1057 05/31/18 0731   ITP Comments  Medical Director- Dr. Fransico Him, MD  pt started group exercise. pt tolerated light activity without difficulty. pt oriented to exercise equipment and safety routine.   30 day ITP review. pt demonstrates eagerness to participate in CR program.  30 day ITP review. pt demonstrates eagerness to participate in CR program.  30 day ITP review. pt demonstrates eagerness to participate in CR program.  pt currently on hold for COVID 19 precautions.       Comments:  Andi Hence, RN, BSN Cardiac Pulmonary Rehab

## 2018-06-01 ENCOUNTER — Encounter (HOSPITAL_COMMUNITY): Payer: Medicare Other

## 2018-06-03 ENCOUNTER — Encounter (HOSPITAL_COMMUNITY): Payer: Medicare Other

## 2018-06-06 ENCOUNTER — Encounter (HOSPITAL_COMMUNITY): Payer: Medicare Other

## 2018-06-06 ENCOUNTER — Ambulatory Visit (HOSPITAL_COMMUNITY): Payer: Medicare Other

## 2018-06-08 ENCOUNTER — Encounter (HOSPITAL_COMMUNITY): Payer: Medicare Other

## 2018-06-10 ENCOUNTER — Encounter (HOSPITAL_COMMUNITY): Payer: Medicare Other

## 2018-06-13 ENCOUNTER — Encounter (HOSPITAL_COMMUNITY): Payer: Medicare Other

## 2018-06-15 ENCOUNTER — Encounter (HOSPITAL_COMMUNITY): Payer: Medicare Other

## 2018-06-15 ENCOUNTER — Telehealth (HOSPITAL_COMMUNITY): Payer: Self-pay | Admitting: Cardiac Rehabilitation

## 2018-06-15 NOTE — Telephone Encounter (Signed)
Pt phone call to inform of continued Outpatient Cardiac Rehab departmental closure for COVID 19 precautions. Future opening date to be determined.  Pt instructed to continue exercising on his own following home exercise guidelines. Pt advised to contact cardiology or PCP PRN symptoms, questions or concerns. Understanding verbalized.   Pt denies food insecurity or other needs at this time.  Pt grandsons are assisting him.   Pt offered emotional support and understanding. Pt expressed gratitude for the  call and is interested in resuming  CRPII when available.  Andi Hence, RN, BSN Cardiac Pulmonary Rehab

## 2018-06-16 ENCOUNTER — Telehealth: Payer: Self-pay | Admitting: Oncology

## 2018-06-16 NOTE — Telephone Encounter (Signed)
Unable to reach patient - called per 4/7 sch message - left message and mailed letter

## 2018-06-17 ENCOUNTER — Encounter (HOSPITAL_COMMUNITY): Payer: Medicare Other

## 2018-06-20 ENCOUNTER — Encounter (HOSPITAL_COMMUNITY): Payer: Medicare Other

## 2018-06-22 ENCOUNTER — Encounter (HOSPITAL_COMMUNITY): Payer: Medicare Other

## 2018-06-24 ENCOUNTER — Encounter (HOSPITAL_COMMUNITY): Payer: Medicare Other

## 2018-06-27 ENCOUNTER — Encounter (HOSPITAL_COMMUNITY): Payer: Medicare Other

## 2018-06-29 ENCOUNTER — Encounter (HOSPITAL_COMMUNITY): Payer: Medicare Other

## 2018-06-30 ENCOUNTER — Other Ambulatory Visit (HOSPITAL_COMMUNITY): Payer: Medicare Other

## 2018-07-01 ENCOUNTER — Other Ambulatory Visit: Payer: Self-pay

## 2018-07-01 ENCOUNTER — Other Ambulatory Visit (HOSPITAL_COMMUNITY): Payer: Medicare Other

## 2018-07-01 ENCOUNTER — Encounter (HOSPITAL_COMMUNITY): Payer: Medicare Other

## 2018-07-01 ENCOUNTER — Telehealth (HOSPITAL_COMMUNITY): Payer: Self-pay

## 2018-07-01 ENCOUNTER — Other Ambulatory Visit: Payer: Medicare Other

## 2018-07-01 ENCOUNTER — Encounter (HOSPITAL_COMMUNITY): Payer: Medicare Other | Admitting: Internal Medicine

## 2018-07-01 ENCOUNTER — Ambulatory Visit (HOSPITAL_COMMUNITY)
Admission: RE | Admit: 2018-07-01 | Discharge: 2018-07-01 | Disposition: A | Discharge status: Home / Self Care | Payer: Medicare Other | Source: Ambulatory Visit | Attending: Cardiology | Admitting: Cardiology

## 2018-07-01 ENCOUNTER — Ambulatory Visit: Payer: Medicare Other | Admitting: Oncology

## 2018-07-01 DIAGNOSIS — I5022 Chronic systolic (congestive) heart failure: Secondary | ICD-10-CM | POA: Insufficient documentation

## 2018-07-01 LAB — BASIC METABOLIC PANEL
Anion gap: 7 (ref 5–15)
BUN: 18 mg/dL (ref 8–23)
CO2: 23 mmol/L (ref 22–32)
Calcium: 9.3 mg/dL (ref 8.9–10.3)
Chloride: 110 mmol/L (ref 98–111)
Creatinine, Ser: 1.51 mg/dL — ABNORMAL HIGH (ref 0.61–1.24)
GFR calc Af Amer: 50 mL/min — ABNORMAL LOW (ref >60)
GFR calc non Af Amer: 43 mL/min — ABNORMAL LOW (ref >60)
Glucose, Bld: 90 mg/dL (ref 70–99)
Potassium: 4.1 mmol/L (ref 3.5–5.1)
Sodium: 140 mmol/L (ref 135–145)

## 2018-07-01 NOTE — Telephone Encounter (Signed)
-----   Message from Georgiana Shore, NP sent at 07/01/2018 11:36 AM EDT ----- Improved. Can we get him a virtual visit next week for med titration? Thanks!

## 2018-07-01 NOTE — Telephone Encounter (Signed)
Spoke to pt. Relayed lab work results to pt and made up coming appointment.

## 2018-07-04 ENCOUNTER — Telehealth (HOSPITAL_COMMUNITY): Payer: Self-pay

## 2018-07-04 ENCOUNTER — Encounter (HOSPITAL_COMMUNITY): Payer: Medicare Other

## 2018-07-04 NOTE — Telephone Encounter (Signed)
Faxed medical records to [facility] per faxed request. Original request to be scanned in chart. Fax confirmed sent.

## 2018-07-06 ENCOUNTER — Encounter (HOSPITAL_COMMUNITY): Payer: Medicare Other

## 2018-07-07 ENCOUNTER — Other Ambulatory Visit: Payer: Self-pay

## 2018-07-07 ENCOUNTER — Ambulatory Visit (HOSPITAL_COMMUNITY)
Admission: RE | Admit: 2018-07-07 | Discharge: 2018-07-07 | Disposition: A | Discharge status: Home / Self Care | Payer: Medicare Other | Source: Ambulatory Visit | Attending: Internal Medicine | Admitting: Internal Medicine

## 2018-07-07 ENCOUNTER — Telehealth (HOSPITAL_COMMUNITY): Payer: Self-pay | Admitting: Cardiology

## 2018-07-07 DIAGNOSIS — N183 Chronic kidney disease, stage 3 unspecified: Secondary | ICD-10-CM

## 2018-07-07 DIAGNOSIS — I25118 Atherosclerotic heart disease of native coronary artery with other forms of angina pectoris: Secondary | ICD-10-CM

## 2018-07-07 DIAGNOSIS — I5022 Chronic systolic (congestive) heart failure: Secondary | ICD-10-CM | POA: Diagnosis not present

## 2018-07-07 MED ORDER — LOSARTAN POTASSIUM 25 MG PO TABS: 12.5000 mg | ORAL_TABLET | Freq: Every day | ORAL | 3 refills | Status: DC

## 2018-07-07 NOTE — Telephone Encounter (Signed)
1. Order placed for echo and message to schedulers to schedule at a later time  2. New med start reviewed with patient and order sent to pharmacy  3. Orders placed lab appt 5/12 @ 930  4 virtual/telephone followup 6/17 @ 10  5. [facility] [place] already addressed and returned on 4/27, pt made aware  Copy of AVS mailed to patient after verbally reviewing above

## 2018-07-07 NOTE — Addendum Note (Signed)
Encounter addended by: Kerry Dory, CMA on: 07/07/2018 12:32 PM  Actions taken: Order list changed, Diagnosis association updated, Clinical Note Signed

## 2018-07-07 NOTE — Progress Notes (Signed)
Heart Failure TeleHealth Note  Due to national recommendations of social distancing due to [place] 19, telehealth visit is felt to be most appropriate for this patient at this time.  I discussed the limitations, risks, security and privacy concerns of performing an evaluation and management service by telephone and the availability of in person appointments. I also discussed with the patient that there may be a patient responsible charge related to this service. The patient expressed understanding and agreed to proceed.   ID:  Luis Chen, DOB [birth year], MRN [id number]  Location: Home  Provider location: [place] Type of Visit: Established patient   PCP:  Leamon Arnt, MD  Cardiologist:  No primary care provider on file. Primary HF: Dr Aundra Dubin  Chief Complaint: HF follow up   History of Present Illness: Luis Chen is a 78 y.o. male with a history of systolic HF with improved EF from 25% in 2010 ->50%2011 due to NICM,nonobstructiveCAD, CKD, HL, and HTN.  Went for [facility] 12/3/19LHC which showed severe multivessel disease with normal output and EF<25%. Meds adjusted as tolerated. Pt requested to go home, and return for CABG the following week.   Pt admitted 02/17/18 for planned CABG. He tolerated the procedure well. First degree block noted POD 4, but remained stable on low-dose coreg. Potassium supp discontinued. Meds adjusted as tolerated. Discharged 02/22/18 in stable condition.   Patient presents via audio conferencing for a telehealth visit today. Last visit spiro was added back. Overall doing fine. Denies SOB, orthopnea, or PND.  Has some edema in BLE due to knee surgery/vein grafting. Appetite and energy level both good. No CP or dizziness. Trying to do CR exercises at home. Using exercise bike 3x/week. He was furloughed from [employer] and has not received any unemployment. Gets disability and is okay on food currently. Taking all  medications. Weights stable 138-140 lbs. SBP 100-110s on home checks.   Pt denies symptoms of cough, fevers, chills, or new SOB worrisome for COVID 19.    cMRI 02/10/18  1. Moderately dilated LV with EF 22%, diffuse hypokinesis with regional variation. 2. Normal RV size with mildly decreased systolic function, EF 14%. 3. Nonspecific LGE at the anterior RV insertion site, this can be associated with LV volume/pressure overload. Possible coronary pattern LGE at the true apex but subtle and obscured by artifact. There is not significant coronary pattern scarring present on this scan.  [facility] 02/08/18: Ao sat 96%, PA sat 63%, PA pressure mean 25 mm Hg; PCWP mean 23 mm Hg, CO 4.8 L/min; CI 2.6  Past Medical History:  Diagnosis Date   CAD (coronary artery disease)    non obstructive. Left main normal. LAd proximal long 25% stenosis, termingating as focal 50% prox to mid lesion. First & second diag were small, normal. Circumflex in proximal av groove had luminal irregularities.. Was large mid obtuse marg which was branching, scattered luminal irregul. Infer branch did supply some septal perforators. Was PDA,small & normal.  < 1.85mm. There was prox 95% stenosis   Cardiomyopathy    EF was 25% improved to 50%.    CHF (congestive heart failure) (HCC)    CKD (chronic kidney disease)    Hyperlipidemia    transient history, controlled on diet   Hypertension    Past Surgical History:  Procedure Laterality Date   CORONARY ARTERY BYPASS GRAFT N/A 02/17/2018   Procedure: CORONARY ARTERY BYPASS GRAFTING (CABG);  Surgeon: [name], MD;  Location:  [facility];  Service: Open Heart Surgery;  Laterality: N/A;  Times 3 using endoscopically harvested right saphenous vein.     KNEE ARTHROSCOPY Left 07/01/2015   Procedure: Irrigation and debridement left knee arthroscopy;  Surgeon: Renette Butters, MD;  Location: [facility];  Service: Orthopedics;  Laterality: Left;   RIGHT/LEFT HEART CATH AND CORONARY  ANGIOGRAPHY N/A 02/08/2018   Procedure: RIGHT/LEFT HEART CATH AND CORONARY ANGIOGRAPHY;  Surgeon: Jettie Booze, MD;  Location: [facility];  Service: Cardiovascular;  Laterality: N/A;   TEE WITHOUT CARDIOVERSION N/A 02/17/2018   Procedure: TRANSESOPHAGEAL ECHOCARDIOGRAM (TEE);  Surgeon: [name], MD;  Location: [facility];  Service: Open Heart Surgery;  Laterality: N/A;     Current Outpatient Medications  Medication Sig Dispense Refill   aspirin EC 325 MG EC tablet Take 1 tablet (325 mg total) by mouth daily. 30 tablet 0   carvedilol (COREG) 3.125 MG tablet Take 1 tablet (3.125 mg total) by mouth 2 (two) times daily with a meal. 180 tablet 3   feeding supplement, ENSURE ENLIVE, (ENSURE ENLIVE) LIQD Take 237 mLs by mouth 3 (three) times daily between meals. 237 mL 12   Ferrous Gluconate 324 (37.5 Fe) MG TABS TAKE 1 TABLET BY MOUTH TWICE DAILY WITH MEALS 60 tablet 1   latanoprost (XALATAN) 0.005 % ophthalmic solution Place 1 drop into both eyes at bedtime.      rosuvastatin (CRESTOR) 40 MG tablet Take 1 tablet (40 mg total) by mouth daily at 6 PM. 30 tablet 5   spironolactone (ALDACTONE) 25 MG tablet Take 0.5 tablets (12.5 mg total) by mouth daily. 15 tablet 6   traZODone (DESYREL) 50 MG tablet Take 100 mg by mouth at bedtime as needed for sleep.      acetaminophen (TYLENOL) 500 MG tablet Take 2 tablets (1,000 mg total) by mouth every 6 (six) hours. (Patient taking differently: Take 1,000 mg by mouth every 6 (six) hours as needed. ) 30 tablet 0   furosemide (LASIX) 40 MG tablet Take 0.5 tablets (20 mg total) by mouth daily as needed for fluid or edema (for weight gain of more than 3 Lbs in 1 day). (Patient not taking: Reported on 07/07/2018) 30 tablet 0   No current facility-administered medications for this encounter.     Allergies:   Ace inhibitors   Social History:  The patient  reports that he has never smoked. He has never used smokeless tobacco. He reports  current alcohol use. He reports that he does not use drugs.   Family History:  The patient's family history includes Arthritis in his mother; Diabetes in his mother; Heart attack (age of onset: 30) in his mother; Heart disease in his brother and mother; Hyperlipidemia in his mother; Hypertension in his mother; Hypothyroidism in his daughter; Stroke in his daughter.   ROS:  Please see the history of present illness.   All other systems are personally reviewed and negative.    Exam:  (Video/Tele Health Call; Exam is subjective and or/visual.) General:  Speaks in full sentences. No resp difficulty. Lungs: Normal respiratory effort with conversation.  Abdomen: No distension per patient report Extremities: Pt denies edema. Neuro: Alert & oriented x 3.   Recent Labs: 01/25/2018: TSH 1.030 02/16/2018: ALT 17 02/18/2018: Magnesium 2.6 04/22/2018: Hemoglobin 9.5; Platelets 185 07/01/2018: BUN 18; Creatinine, Ser 1.51; Potassium 4.1; Sodium 140  Personally reviewed   Wt Readings from Last 3 Encounters:  05/18/18 62.1 kg (136 lb 12.8 oz)  04/26/18 61.1  kg (134 lb 11.2 oz)  04/22/18 62.2 kg (137 lb 2 oz)      ASSESSMENT AND PLAN:  1. Chronic systolic CHF: Suspectedmixed cardiomyopathy. Patient had EF down to 25% in the past without significant CAD on prior cath, EF then improved over time. Now EF was back down in setting of significant coronary disease, but there may be a nonischemic component as well. He is not a heavy drinker and there is a family history of CAD but not dilated cardiomyopathy. PCWP was mildly elevated on initial RHC, cardiac output was preserved. - Echo 02/2018: EF 20-25%, severe diffuse HK. Cardiac MRI showed EF 22%, relative preservation of RV function (EF 42%) and minimal LGE (only small area of non-coronary pattern LGE at the anterior RV insertion site).  - NYHA II.  - Volume status sounds stable. Takes lasix PRN. Has not needed recently - Continue coreg 3.125 mg  BID - Continue spiro 12.5 mg daily - Add back losartan 12.5 mg daily. He will continue to monitor his BP at home. Check BMET 2 weeks.  - Reinforced fluid restriction to < 2 L daily, sodium restriction to less than 2000 mg daily, and the importance of daily weights.   - Repeat echo when COVID restrictions lifted. I will add him to recall list. He does not think he would want an ICD.   2. CAD s/p CABG x 3 - Continue ASA and statin. - [place] had something to do with CAD. Working with [place] to pay for everything.  - No s/s ischemia.  3. CKD III - Previous creatinine baseline 1.5-1.7 - Creatinine back to baseline, 1.51 on labs 4/24. K 4.1   COVID screen The patient does not have any symptoms that suggest any further testing/ screening at this time.  Social distancing reinforced today.   Patient Risk: After full review of this patients clinical status, I feel that they are at moderate risk for cardiac decompensation at this time.  Orders/Follow up: Will put on Echo recall list. Restart losartan 12.5 mg daily. BMET in 2 weeks. Follow up in 6 weeks. Will follow up on [place] paperwork that was supposed to be sent to [place].   Today, I have spent 23 minutes with the patient with telehealth technology discussing the above issues.    Signed, Georgiana Shore, NP  07/07/2018 11:49 AM   [facility] [address] Heart and [place] [address] [phone] (office) [phone] (fax)

## 2018-07-07 NOTE — Patient Instructions (Addendum)
START Losartan 12.5 mg, one half tab daily  Labs needed in 2 weeks 07/21/18 @ [place] recommends that you schedule a follow-up appointment in: 6-8 weeks 08/24/18 @ 10 am  Your physician has requested that you have an echocardiogram. Echocardiography is a painless test that uses sound waves to create images of your heart. It provides your doctor with information about the size and shape of your heart and how well your heart's chambers and valves are working. This procedure takes approximately one hour. There are no restrictions for this procedure. - we will call you at a later time to get this scheduled

## 2018-07-07 NOTE — Telephone Encounter (Signed)
-----   Message from Georgiana Shore, NP sent at 07/07/2018 11:55 AM EDT ----- Regarding: Visit 1. Please put on Echo recall list.   2. Start losartan 12.5 mg daily.   3. Check BMET in 2 weeks in HF clinic.   4. Follow up in 6-8 weeks with APP.   5. He said [facility] was supposed to send some paperwork for [place] to fill out - Can you see if we got anything? Sounds like he is having a hard time getting VA to pay for his hospital bills. I have cc'd Jenna to help [place] follow up on this.

## 2018-07-08 ENCOUNTER — Encounter (HOSPITAL_COMMUNITY): Payer: Medicare Other

## 2018-07-11 ENCOUNTER — Encounter (HOSPITAL_COMMUNITY): Payer: Medicare Other

## 2018-07-12 ENCOUNTER — Telehealth (HOSPITAL_COMMUNITY): Payer: Self-pay | Admitting: Licensed Clinical Social Worker

## 2018-07-12 NOTE — Telephone Encounter (Signed)
CSW consulted to help with patient concerns with [facility] paying for medical bills.  CSW called pt who states he has coverage through Medicare and BCBS which are paying the hospital bills so he has not needed to get coverage through the [place] at this time.  Only concern with insurance coverage is that insurance need verification form pt MDs- CSW informed pt that our clinic had sent back requested paperwork on 07/04/18.  No further concerns or needs expressed at this time- CSW will continue to follow and assist as needed  Jorge Ny, [facility] Desk#: [phone] Cell#: [phone]

## 2018-07-13 ENCOUNTER — Encounter (HOSPITAL_COMMUNITY): Payer: Medicare Other

## 2018-07-15 ENCOUNTER — Encounter (HOSPITAL_COMMUNITY): Payer: Medicare Other

## 2018-07-18 ENCOUNTER — Encounter (HOSPITAL_COMMUNITY): Payer: Medicare Other

## 2018-07-20 ENCOUNTER — Encounter (HOSPITAL_COMMUNITY): Payer: Medicare Other

## 2018-07-21 ENCOUNTER — Ambulatory Visit (HOSPITAL_COMMUNITY)
Admission: RE | Admit: 2018-07-21 | Discharge: 2018-07-21 | Disposition: A | Discharge status: Home / Self Care | Payer: Medicare Other | Source: Ambulatory Visit | Attending: Cardiology | Admitting: Cardiology

## 2018-07-21 ENCOUNTER — Other Ambulatory Visit: Payer: Self-pay

## 2018-07-21 DIAGNOSIS — I5022 Chronic systolic (congestive) heart failure: Secondary | ICD-10-CM | POA: Diagnosis not present

## 2018-07-21 LAB — BASIC METABOLIC PANEL
Anion gap: 7 (ref 5–15)
BUN: 22 mg/dL (ref 8–23)
CO2: 25 mmol/L (ref 22–32)
Calcium: 9.6 mg/dL (ref 8.9–10.3)
Chloride: 106 mmol/L (ref 98–111)
Creatinine, Ser: 1.66 mg/dL — ABNORMAL HIGH (ref 0.61–1.24)
GFR calc Af Amer: 45 mL/min — ABNORMAL LOW (ref >60)
GFR calc non Af Amer: 39 mL/min — ABNORMAL LOW (ref >60)
Glucose, Bld: 83 mg/dL (ref 70–99)
Potassium: 4.5 mmol/L (ref 3.5–5.1)
Sodium: 138 mmol/L (ref 135–145)

## 2018-07-22 ENCOUNTER — Encounter (HOSPITAL_COMMUNITY): Payer: Medicare Other

## 2018-07-25 ENCOUNTER — Encounter (HOSPITAL_COMMUNITY): Payer: Medicare Other | Admitting: Cardiology

## 2018-07-25 ENCOUNTER — Encounter (HOSPITAL_COMMUNITY): Payer: Medicare Other

## 2018-07-27 ENCOUNTER — Encounter (HOSPITAL_COMMUNITY): Payer: Medicare Other

## 2018-07-29 ENCOUNTER — Encounter (HOSPITAL_COMMUNITY): Payer: Medicare Other

## 2018-08-02 ENCOUNTER — Telehealth (HOSPITAL_COMMUNITY): Payer: Self-pay

## 2018-08-02 NOTE — Telephone Encounter (Signed)
Phone call made to Pt to provide information about virtual cardiac rehab. Pt was responsive and agreed he would like to continue with virtual cardiac rehab. Will continue forward with getting Pt started on virtual rehab.

## 2018-08-03 ENCOUNTER — Encounter (HOSPITAL_COMMUNITY): Payer: Medicare Other

## 2018-08-03 ENCOUNTER — Encounter (HOSPITAL_COMMUNITY): Payer: Self-pay

## 2018-08-03 NOTE — Progress Notes (Signed)
Transitioning to virtual cardiac rehab ° °Dr.  Hochrein ° ° As you are aware our department remains closed to patients due to Covid-19.  We are excited to be able to offer an alternative to traditional onsite Cardiac Rehab while your patient continues to follow Re-Open guidelines.  This is a notification that your patient has been contacted and is very interested in participating in Virtual Cardiac Rehab. ° °Thank you for your continued support in helping us meet the health care needs of our patients. ° °Gloria W. Support Rep II ° ° °Cardiac Rehab staff °

## 2018-08-05 ENCOUNTER — Encounter (HOSPITAL_COMMUNITY): Payer: Medicare Other

## 2018-08-08 ENCOUNTER — Telehealth (HOSPITAL_COMMUNITY): Payer: Self-pay

## 2018-08-08 NOTE — Telephone Encounter (Signed)
Attempted to contact pt to schedule an appt for our Virtual Cardiac Rehab, LMTCB.

## 2018-08-22 ENCOUNTER — Telehealth (HOSPITAL_COMMUNITY): Payer: Self-pay | Admitting: *Deleted

## 2018-08-22 NOTE — Telephone Encounter (Signed)
Left second message for cardiac rehab.  Pt sent please contact letter for virtual cardiac rehab. Pt has until 09/05/18 to respond.  If no response will discharge pt from the rehab program. Maurice Small RN, BSN Cardiac and Pulmonary Rehab Nurse Navigator

## 2018-08-24 ENCOUNTER — Other Ambulatory Visit: Payer: Self-pay

## 2018-08-24 ENCOUNTER — Ambulatory Visit (HOSPITAL_COMMUNITY)
Admission: RE | Admit: 2018-08-24 | Discharge: 2018-08-24 | Disposition: A | Discharge status: Home / Self Care | Payer: Medicare Other | Source: Ambulatory Visit | Attending: Internal Medicine | Admitting: Internal Medicine

## 2018-09-05 NOTE — Progress Notes (Signed)
Pt did not respond to letter that was sent regarding discharge from cardiac rehab. Pt will be discharged from the program on 09/05/2018.

## 2018-09-05 NOTE — Addendum Note (Signed)
Encounter addended by: Jeralyn Bennett on: 09/05/2018 4:00 PM  Actions taken: Clinical Note Signed

## 2018-09-06 NOTE — Progress Notes (Signed)
Discharge Progress Report  Patient Details  Name: Luis Chen MRN: [id number] Date of Birth: 12-May-1940 Referring Provider:     [facility] from 04/26/2018 in [place]  Referring Provider  Dr. Percival Spanish       Number of Visits: 10  Reason for Discharge:  Early Exit:  Lack of attendance and unable to contact pt  Smoking History:  Social History   Tobacco Use  Smoking Status Never Smoker  Smokeless Tobacco Never Used    Diagnosis:  02/17/18 CABG x3  ADL [facility]:   Initial Exercise Prescription: Initial Exercise Prescription - 04/26/18 1000      Date of Initial Exercise RX and Referring Provider   Date  04/26/18    Referring Provider  Dr. Percival Spanish    Expected Discharge Date  07/29/18      Treadmill   MPH  2.5    Grade  0    Minutes  10      Recumbant Bike   Level  2    Watts  15    Minutes  10    METs  2.79      NuStep   Level  3    SPM  75    Minutes  10    METs  2.8      Prescription Details   Frequency (times per week)  3    Duration  Progress to 30 minutes of continuous aerobic without signs/symptoms of physical distress      Intensity   THRR 40-80% of Max Heartrate  59-113    Ratings of Perceived Exertion  11-13      Progression   Progression  Continue to progress workloads to maintain intensity without signs/symptoms of physical distress.      Resistance Training   Training Prescription  Yes    Weight  3 lbs.     Reps  10-15       Discharge Exercise Prescription (Final Exercise Prescription Changes): Exercise Prescription Changes - 05/20/18 1400      Home Exercise Plan   Plans to continue exercise at  Home (comment)    Frequency  Add 4 additional days to program exercise sessions.    Initial Home Exercises Provided  05/20/18       Functional Capacity: 6 Minute Walk    Row Name 04/26/18 1038         6 Minute Walk   Phase  Initial     Distance  1401 feet     Walk Time  6  minutes     # of Rest Breaks  0     MPH  2.6     METS  2.9     RPE  11     VO2 Peak  10.25     Symptoms  Yes (comment)     Comments  Right calf pain     Resting HR  89 bpm     Resting BP  98/60     Resting Oxygen Saturation   99 %     Exercise Oxygen Saturation  during 6 min walk  97 %     Max Ex. HR  106 bpm     Max Ex. BP  112/78     2 Minute Post BP  104/60        Psychological, QOL, Others - Outcomes: PHQ 2/9: Depression screen [facility] 2/9 05/02/2018 08/25/2017 02/23/2017  Decreased Interest 0 0 0  Down,  Depressed, Hopeless 0 0 1  PHQ - 2 Score 0 0 1    Quality of Life: Quality of Life - 04/26/18 0943      Quality of Life   Select  Quality of Life      Quality of Life Scores   Health/Function Pre  25.9 %    Socioeconomic Pre  24.75 %    Psych/Spiritual Pre  24.86 %    Family Pre  24 %    GLOBAL Pre  25.16 %       Personal Goals: Goals established at orientation with interventions provided to work toward goal. Personal Goals and Risk Factors at Admission - 04/26/18 1113      Core Components/Risk Factors/Patient Goals on Admission    Weight Management  Yes;Weight Maintenance    Intervention  Weight Management: Develop a combined nutrition and exercise program designed to reach desired caloric intake, while maintaining appropriate intake of nutrient and fiber, sodium and fats, and appropriate energy expenditure required for the weight goal.;Weight Management: Provide education and appropriate resources to help participant work on and attain dietary goals.    Admit Weight  134 lb 11.2 oz (61.1 kg)    Expected Outcomes  Short Term: Continue to assess and modify interventions until short term weight is achieved;Long Term: Adherence to nutrition and physical activity/exercise program aimed toward attainment of established weight goal;Weight Maintenance: Understanding of the daily nutrition guidelines, which includes 25-35% calories from fat, 7% or less cal from saturated fats,  less than 265m cholesterol, less than 1.5gm of sodium, & 5 or more servings of fruits and vegetables daily;Understanding recommendations for meals to include 15-35% energy as protein, 25-35% energy from fat, 35-60% energy from carbohydrates, less than 2059mof dietary cholesterol, 20-35 gm of total fiber daily;Understanding of distribution of calorie intake throughout the day with the consumption of 4-5 meals/snacks    Hypertension  Yes    Intervention  Provide education on lifestyle modifcations including regular physical activity/exercise, weight management, moderate sodium restriction and increased consumption of fresh fruit, vegetables, and low fat dairy, alcohol moderation, and smoking cessation.;Monitor prescription use compliance.    Expected Outcomes  Short Term: Continued assessment and intervention until BP is < 140/9022mG in hypertensive participants. < 130/59m84m in hypertensive participants with diabetes, heart failure or chronic kidney disease.;Long Term: Maintenance of blood pressure at goal levels.    Lipids  Yes    Intervention  Provide education and support for participant on nutrition & aerobic/resistive exercise along with prescribed medications to achieve LDL <70mg40mL >40mg.66mExpected Outcomes  Short Term: Participant states understanding of desired cholesterol values and is compliant with medications prescribed. Participant is following exercise prescription and nutrition guidelines.;Long Term: Cholesterol controlled with medications as prescribed, with individualized exercise RX and with personalized nutrition plan. Value goals: LDL < 70mg, 62m> 40 mg.    Stress  Yes    Intervention  Offer individual and/or small group education and counseling on adjustment to heart disease, stress management and health-related lifestyle change. Teach and support self-help strategies.;Refer participants experiencing significant psychosocial distress to appropriate mental health specialists for  further evaluation and treatment. When possible, include family members and significant others in education/counseling sessions.    Expected Outcomes  Short Term: Participant demonstrates changes in health-related behavior, relaxation and other stress management skills, ability to obtain effective social support, and compliance with psychotropic medications if prescribed.;Long Term: Emotional wellbeing is indicated by absence of clinically significant psychosocial  distress or social isolation.        Personal Goals Discharge: Goals and Risk Factor Review    Row Name 05/02/18 1505 05/10/18 1314 05/25/18 1057         Core Components/Risk Factors/Patient Goals Review   Personal Goals Review  Weight Management/Obesity;Hypertension;Lipids;Stress  Weight Management/Obesity;Hypertension;Lipids;Stress  Weight Management/Obesity;Hypertension;Lipids;Stress     Review  pt with mulitple CAD RF demonstrates willingness to participate in CR program. pt personal goals are to increase strength/stamina, balance and upper arm strength.   pt with mulitple CAD RF demonstrates willingness to participate in CR program. pt personal goals are to increase strength/stamina, balance and upper arm strength.   pt with mulitple CAD RF demonstrates willingness to participate in CR program. pt personal goals are to increase strength/stamina, balance and upper arm strength. pt currently on hold for departmental closing from Covid 19 precautions. .      Expected Outcomes  pt will participate in CR exercise, nutrition and lifestyle modification to decrease overall RF.   pt will participate in CR exercise, nutrition and lifestyle modification to decrease overall RF.   pt will participate in CR exercise, nutrition and lifestyle modification to decrease overall RF.        Exercise Goals and Review: Exercise Goals    Row Name 04/26/18 1111             Exercise Goals   Increase Physical Activity  Yes       Intervention  Provide  advice, education, support and counseling about physical activity/exercise needs.;Develop an individualized exercise prescription for aerobic and resistive training based on initial evaluation findings, risk stratification, comorbidities and participant's personal goals.       Expected Outcomes  Short Term: Attend rehab on a regular basis to increase amount of physical activity.       Increase Strength and Stamina  Yes       Intervention  Provide advice, education, support and counseling about physical activity/exercise needs.;Develop an individualized exercise prescription for aerobic and resistive training based on initial evaluation findings, risk stratification, comorbidities and participant's personal goals.       Expected Outcomes  Short Term: Increase workloads from initial exercise prescription for resistance, speed, and METs.       Able to understand and use rate of perceived exertion (RPE) scale  Yes       Intervention  Provide education and explanation on how to use RPE scale       Expected Outcomes  Short Term: Able to use RPE daily in rehab to express subjective intensity level;Long Term:  Able to use RPE to guide intensity level when exercising independently       Knowledge and understanding of Target Heart Rate Range (THRR)  Yes       Intervention  Provide education and explanation of THRR including how the numbers were predicted and where they are located for reference       Expected Outcomes  Short Term: Able to state/look up THRR;Long Term: Able to use THRR to govern intensity when exercising independently;Short Term: Able to use daily as guideline for intensity in rehab       Able to check pulse independently  Yes       Intervention  Provide education and demonstration on how to check pulse in carotid and radial arteries.;Review the importance of being able to check your own pulse for safety during independent exercise       Expected Outcomes  Short Term: Able to  explain why pulse  checking is important during independent exercise;Long Term: Able to check pulse independently and accurately       Understanding of Exercise Prescription  Yes       Intervention  Provide education, explanation, and written materials on patient's individual exercise prescription       Expected Outcomes  Short Term: Able to explain program exercise prescription;Long Term: Able to explain home exercise prescription to exercise independently          Exercise Goals Re-Evaluation: Exercise Goals Re-Evaluation    Row Name 05/02/18 0957 05/10/18 1442 05/20/18 1450 05/26/18 0845       Exercise Goal Re-Evaluation   Exercise Goals Review  Increase Physical Activity;Increase Strength and Stamina;Able to understand and use rate of perceived exertion (RPE) scale;Knowledge and understanding of Target Heart Rate Range (THRR);Understanding of Exercise Prescription  Increase Physical Activity;Increase Strength and Stamina;Able to understand and use rate of perceived exertion (RPE) scale;Knowledge and understanding of Target Heart Rate Range (THRR);Understanding of Exercise Prescription  Increase Physical Activity;Increase Strength and Stamina;Able to understand and use rate of perceived exertion (RPE) scale;Knowledge and understanding of Target Heart Rate Range (THRR);Understanding of Exercise Prescription  Increase Physical Activity;Increase Strength and Stamina;Able to understand and use rate of perceived exertion (RPE) scale;Knowledge and understanding of Target Heart Rate Range (THRR);Understanding of Exercise Prescription    Comments  Pt first day of Cardiac Rehab. Pt tolerated exercise well and understands RPE scale, THRR, and workloads. Will progress Pt as tolerated.   Reviewed METs and goals with Pt. Pt MET level is 2.5. Pt is tolerating exercise well. Pt is walking 2-4 days 30-45 minutes. Pt has goals to add time on a stationary bike to his daily exercise routine.   Reviewed HEP with Pt. Pt was repsonsive and  understands THRR, RPE, end points of exercise, weather precautions, warm up and cool down exericses. Pt is currently walking and riding his statioinary bike for 30-45 minutes 2-4 days week in addition to Cardiac Rehab.   Unable to review METs and goals with Pt due to closure of Cardiac Rehab for [place]. Pt MET level is 2.6 and is progressing well. Pt is tolerating exercise Rx well.     Expected Outcomes  Will continue to monitor and progress Pt as tolerated.   Will continue to monitor and progress Pt as tolerated.   Will continue to monitor and progress Pt as tolerated.   Will continue to monitor and progress Pt as tolerated.        Nutrition & Weight - Outcomes: Pre Biometrics - 04/26/18 1112      Pre Biometrics   Height  _0  (1.727 m)    Weight  61.1 kg    Waist Circumference  32.5 inches    Hip Circumference  38.25 inches    Waist to Hip Ratio  0.85 %    BMI (Calculated)  20.49    Triceps Skinfold  10 mm    % Body Fat  20.2 %    Grip Strength  42 kg    Flexibility  9 in    Single Leg Stand  20 seconds        Nutrition: Nutrition Therapy & Goals - 04/26/18 1517      Nutrition Therapy   Diet  general healthful      Personal Nutrition Goals   Nutrition Goal  Pt to identify food quantities necessary to achieve weight maintenance of at graduation from cardiac rehab    Personal Goal #  2  Pt to build a healthy plate including vegetables, fruits, whole grains, and low-fat dairy products in a heart healthy meal plan    Personal Goal #3  Pt able to name foods that affect blood glucose      Intervention Plan   Intervention  Prescribe, educate and counsel regarding individualized specific dietary modifications aiming towards targeted core components such as weight, hypertension, lipid management, diabetes, heart failure and other comorbidities.    Expected Outcomes  Short Term Goal: Understand basic principles of dietary content, such as calories, fat, sodium, cholesterol and  nutrients.;Long Term Goal: Adherence to prescribed nutrition plan.       Nutrition Discharge: Nutrition Assessments - 04/26/18 1522      MEDFICTS Scores   Pre Score  26       Education Questionnaire Score: Knowledge Questionnaire Score - 04/26/18 0943      Knowledge Questionnaire Score   Pre Score  19/24       Goals reviewed with patient; copy given to patient.

## 2018-09-06 NOTE — Addendum Note (Signed)
Encounter addended by: Noel Christmas, RN on: 09/06/2018 4:05 PM  Actions taken: Clinical Note Signed

## 2018-09-14 DIAGNOSIS — D472 Monoclonal gammopathy: Secondary | ICD-10-CM | POA: Diagnosis not present

## 2018-09-14 DIAGNOSIS — I429 Cardiomyopathy, unspecified: Secondary | ICD-10-CM | POA: Diagnosis not present

## 2018-09-14 DIAGNOSIS — I129 Hypertensive chronic kidney disease with stage 1 through stage 4 chronic kidney disease, or unspecified chronic kidney disease: Secondary | ICD-10-CM | POA: Diagnosis not present

## 2018-09-14 DIAGNOSIS — D631 Anemia in chronic kidney disease: Secondary | ICD-10-CM | POA: Diagnosis not present

## 2018-09-14 DIAGNOSIS — N189 Chronic kidney disease, unspecified: Secondary | ICD-10-CM | POA: Diagnosis not present

## 2018-09-14 DIAGNOSIS — E611 Iron deficiency: Secondary | ICD-10-CM | POA: Diagnosis not present

## 2018-09-14 DIAGNOSIS — N179 Acute kidney failure, unspecified: Secondary | ICD-10-CM | POA: Diagnosis not present

## 2018-09-14 DIAGNOSIS — N183 Chronic kidney disease, stage 3 (moderate): Secondary | ICD-10-CM | POA: Diagnosis not present

## 2018-09-14 LAB — IRON,TIBC AND FERRITIN PANEL
Ferritin: 570
Iron: 93
TIBC: 247
UIBC: 157

## 2018-09-14 LAB — CBC AND DIFFERENTIAL
HCT: 34 — AB (ref 41–53)
Hemoglobin: 11.4 — AB (ref 13.5–17.5)
WBC: 5.2

## 2018-09-14 LAB — BASIC METABOLIC PANEL
BUN: 27 — AB (ref 4–21)
Creatinine: 1.4 — AB (ref 0.6–1.3)
Glucose: 92
Potassium: 4.5 (ref 3.4–5.3)
Sodium: 139 (ref 137–147)

## 2018-09-14 LAB — HEPATIC FUNCTION PANEL
ALT: 14 (ref 10–40)
AST: 20 (ref 14–40)

## 2018-09-20 ENCOUNTER — Telehealth: Payer: Self-pay | Admitting: Family Medicine

## 2018-09-20 ENCOUNTER — Encounter: Payer: Self-pay | Admitting: *Deleted

## 2018-09-20 ENCOUNTER — Encounter: Payer: Self-pay | Admitting: Family Medicine

## 2018-09-20 MED ORDER — SPIRONOLACTONE 25 MG PO TABS: 12.5000 mg | ORAL_TABLET | Freq: Every day | ORAL | 1 refills | Status: DC

## 2018-09-20 MED ORDER — ROSUVASTATIN CALCIUM 40 MG PO TABS: 40.0000 mg | ORAL_TABLET | Freq: Every day | ORAL | 1 refills | Status: DC

## 2018-09-20 NOTE — Telephone Encounter (Signed)
Pt aware via Mychart RXs have been refilled

## 2018-09-20 NOTE — Telephone Encounter (Signed)
Please see mycharge request and complete requested letter.

## 2018-09-20 NOTE — Telephone Encounter (Signed)
Medication Refill - Medication: spironolactone (ALDACTONE) 25 MG tablet [[id number]]   rosuvastatin (CRESTOR) 40 MG tablet [[id number]]   Has the patient contacted their pharmacy? No. (Agent: If no, request that the patient contact the pharmacy for the refill.)   Preferred Pharmacy (with phone number or street name [facility] [address] [place] - [place] [address] [phone] (Phone) [phone] (Fax)     Agent: Please be advised that RX refills may take up to 3 business days. We ask that you follow-up with your pharmacy.

## 2018-09-26 ENCOUNTER — Encounter: Payer: Self-pay | Admitting: Family Medicine

## 2018-10-03 ENCOUNTER — Telehealth (HOSPITAL_COMMUNITY): Payer: Self-pay | Admitting: Cardiology

## 2018-10-03 ENCOUNTER — Other Ambulatory Visit: Payer: Self-pay

## 2018-10-03 ENCOUNTER — Ambulatory Visit (HOSPITAL_COMMUNITY)
Admission: RE | Admit: 2018-10-03 | Discharge: 2018-10-03 | Disposition: A | Discharge status: Home / Self Care | Payer: Medicare Other | Source: Ambulatory Visit | Attending: Cardiology | Admitting: Cardiology

## 2018-10-03 ENCOUNTER — Encounter (HOSPITAL_COMMUNITY): Payer: Self-pay

## 2018-10-03 VITALS — BP 124/70 | HR 60 | Wt 144.0 lb

## 2018-10-03 DIAGNOSIS — I25118 Atherosclerotic heart disease of native coronary artery with other forms of angina pectoris: Secondary | ICD-10-CM

## 2018-10-03 DIAGNOSIS — N183 Chronic kidney disease, stage 3 unspecified: Secondary | ICD-10-CM

## 2018-10-03 DIAGNOSIS — I5022 Chronic systolic (congestive) heart failure: Secondary | ICD-10-CM | POA: Diagnosis not present

## 2018-10-03 NOTE — Addendum Note (Signed)
Encounter addended by: Kerry Dory, CMA on: 10/03/2018 11:27 AM  Actions taken: Order list changed, Diagnosis association updated, Clinical Note Signed

## 2018-10-03 NOTE — Telephone Encounter (Signed)
-----   Message from Conrad Hickory, NP sent at 10/03/2018 11:10 AM EDT ----- Regarding: f/u F/U [facility] 6-8 weeks with an ECHO.   No other changes.   Thks A

## 2018-10-03 NOTE — Patient Instructions (Signed)
It was great to see you today! No medication changes are needed at this time.  Your physician recommends that you schedule a follow-up appointment in: 6-8 weeks with Dr Aundra Dubin and echo   Your physician has requested that you have an echocardiogram. Echocardiography is a painless test that uses sound waves to create images of your heart. It provides your doctor with information about the size and shape of your heart and how well your heart's chambers and valves are working. This procedure takes approximately one hour. There are no restrictions for this procedure.   Do the following things EVERYDAY: 1) Weigh yourself in the morning before breakfast. Write it down and keep it in a log. 2) Take your medicines as prescribed 3) Eat low salt foods-Limit salt (sodium) to 2000 mg per day.  4) Stay as active as you can everyday 5) Limit all fluids for the day to less than 2 liters   At the [facility], you and your health needs are our priority. As part of our continuing mission to provide you with exceptional heart care, we have created designated Provider Care Teams. These Care Teams include your primary Cardiologist (physician) and Advanced Practice Providers (APPs- Physician Assistants and Nurse Practitioners) who all work together to provide you with the care you need, when you need it.   You may see any of the following providers on your designated Care Team at your next follow up: Marland Kitchen Dr Glori Bickers . Dr Loralie Champagne . Darrick Grinder, NP

## 2018-10-03 NOTE — Progress Notes (Signed)
Heart Failure TeleHealth Note  Due to national recommendations of social distancing due to [place] 19, Audio/video telehealth visit is felt to be most appropriate for this patient at this time.  See MyChart message from today for patient consent regarding telehealth for [name].  Date:  10/03/2018   ID:  Floyce Stakes, DOB [birth year], MRN [id number]  Location: Home  Provider location: [place] [facility] Failure Type of Visit: Established patient   PCP:  Leamon Arnt, MD  Cardiologist:  No primary care provider on file. Primary HF: Dr Aundra Dubin  Nephrology: Dr Marval Regal  Chief Complaint: Heart Failure   History of Present Illness: Luis Chen is a 78 y.o. male with a history of systolic HF with improved EF from 25% in 2010 ->50%2011 due to NICM,nonobstructiveCAD, CKD, HL, and HTN.  Went for [facility] 12/3/19L[facility] which showed severe multivessel disease with normal output and EF<25%. Meds adjusted as tolerated. Pt requested to go home, and return for CABG the following week.   Pt admitted 02/17/18 for planned CABG. He tolerated the procedure well. First degree block noted POD 4, but remained stable on low-dose coreg. Potassium supp discontinued. Meds adjusted as tolerated. Discharged 02/22/18 in stable condition.  He presents via Engineer, civil (consulting) for a telehealth visit today.   Overall feeling fine. Denies SOB/PND/Orthopnea. No chest pain. Appetite ok. No fever or chills. Weight at home 144  pounds. Taking all medications.   he denies symptoms worrisome for COVID 19.   cMRI 02/10/18 1. Moderately dilated LV with EF 22%, diffuse hypokinesis with regional variation. 2. Normal RV size with mildly decreased systolic function, EF 78%. 3. Nonspecific LGE at the anterior RV insertion site, this can be associated with LV volume/pressure overload. Possible coronary pattern LGE at the true apex but subtle and obscured by artifact. There is not significant coronary  pattern scarring present on this scan.  [place] 02/08/18:Ao sat 96%, PA sat 63%, PA pressure mean 25 mm Hg; PCWP mean 23 mm Hg, CO 4.8 L/min; CI 2.6   Past Medical History:  Diagnosis Date  . CAD (coronary artery disease)    non obstructive. Left main normal. LAd proximal long 25% stenosis, termingating as focal 50% prox to mid lesion. First & second diag were small, normal. Circumflex in proximal av groove had luminal irregularities.. Was large mid obtuse marg which was branching, scattered luminal irregul. Infer branch did supply some septal perforators. Was PDA,small & normal.  < 1.29mm. There was prox 95% stenosis  . Cardiomyopathy    EF was 25% improved to 50%.   . CHF (congestive heart failure) ([place])   . CKD (chronic kidney disease)   . Hyperlipidemia    transient history, controlled on diet  . Hypertension    Past Surgical History:  Procedure Laterality Date  . CORONARY ARTERY BYPASS GRAFT N/A 02/17/2018   Procedure: CORONARY ARTERY BYPASS GRAFTING (CABG);  Surgeon: [name], MD;  Location: [place];  Service: Open Heart Surgery;  Laterality: N/A;  Times 3 using endoscopically harvested right saphenous vein.    Marland Kitchen KNEE ARTHROSCOPY Left 07/01/2015   Procedure: Irrigation and debridement left knee arthroscopy;  Surgeon: Renette Butters, MD;  Location: [place];  Service: Orthopedics;  Laterality: Left;  . RIGHT/LEFT HEART CATH AND CORONARY ANGIOGRAPHY N/A 02/08/2018   Procedure: RIGHT/LEFT HEART CATH AND CORONARY ANGIOGRAPHY;  Surgeon: Jettie Booze, MD;  Location: [place] [facility];  Service: Cardiovascular;  Laterality: N/A;  . TEE WITHOUT  CARDIOVERSION N/A 02/17/2018   Procedure: TRANSESOPHAGEAL ECHOCARDIOGRAM (TEE);  Surgeon: [name], MD;  Location: [place];  Service: Open Heart Surgery;  Laterality: N/A;     Current Outpatient Medications  Medication Sig Dispense Refill  . acetaminophen (TYLENOL) 500 MG tablet Take 2 tablets (1,000 mg total) by mouth every 6 (six)  hours. (Patient taking differently: Take 1,000 mg by mouth every 6 (six) hours as needed. ) 30 tablet 0  . aspirin EC 325 MG EC tablet Take 1 tablet (325 mg total) by mouth daily. 30 tablet 0  . carvedilol (COREG) 3.125 MG tablet Take 1 tablet (3.125 mg total) by mouth 2 (two) times daily with a meal. 180 tablet 3  . Ferrous Gluconate 324 (37.5 Fe) MG TABS TAKE 1 TABLET BY MOUTH TWICE DAILY WITH MEALS 60 tablet 1  . furosemide (LASIX) 40 MG tablet Take 0.5 tablets (20 mg total) by mouth daily as needed for fluid or edema (for weight gain of more than 3 Lbs in 1 day). 30 tablet 0  . latanoprost (XALATAN) 0.005 % ophthalmic solution Place 1 drop into both eyes at bedtime.     Marland Kitchen losartan (COZAAR) 25 MG tablet Take 0.5 tablets (12.5 mg total) by mouth daily. 45 tablet 3  . rosuvastatin (CRESTOR) 40 MG tablet Take 1 tablet (40 mg total) by mouth daily at 6 PM. 30 tablet 1  . spironolactone (ALDACTONE) 25 MG tablet Take 0.5 tablets (12.5 mg total) by mouth daily. 15 tablet 1  . traZODone (DESYREL) 50 MG tablet Take 100 mg by mouth at bedtime as needed for sleep.     . feeding supplement, ENSURE ENLIVE, (ENSURE ENLIVE) LIQD Take 237 mLs by mouth 3 (three) times daily between meals. (Patient not taking: Reported on 10/03/2018) 237 mL 12   No current facility-administered medications for this encounter.     Allergies:   Ace inhibitors   Social History:  The patient  reports that he has never smoked. He has never used smokeless tobacco. He reports current alcohol use. He reports that he does not use drugs.   Family History:  The patient's family history includes Arthritis in his mother; Diabetes in his mother; Heart attack (age of onset: 87) in his mother; Heart disease in his brother and mother; Hyperlipidemia in his mother; Hypertension in his mother; Hypothyroidism in his daughter; Stroke in his daughter.   ROS:  Please see the history of present illness.   All other systems are personally reviewed and  negative.  54 Exam:  Tele Health Call; Exam is subjective  General:  Speaks in full sentences. No resp difficulty. Lungs: Normal respiratory effort with conversation.  Abdomen: Non-distended per patient report Extremities: Pt denies edema. Neuro: Alert & oriented x 3.   Recent Labs: 01/25/2018: TSH 1.030 02/18/2018: Magnesium 2.6 04/22/2018: Platelets 185 09/14/2018: ALT 14; BUN 27; Creatinine 1.4; Hemoglobin 11.4; Potassium 4.5; Sodium 139  Personally reviewed   Wt Readings from Last 3 Encounters:  10/03/18 65.3 kg (144 lb)  05/18/18 62.1 kg (136 lb 12.8 oz)  04/26/18 61.1 kg (134 lb 11.2 oz)      ASSESSMENT AND PLAN:  1. Chronic systolic WJX:[id number] cardiomyopathy. Patient had EF down to 25% in the past without significant CAD on prior cath, EF then improved over time. Now EF was back down in setting of significant coronary disease, but there may be a nonischemic component as well. He is not a heavy drinker and there is a family history of CAD  but not dilated cardiomyopathy. PCWP was mildly elevated on initial RHC, cardiac output was preserved. - Echo 02/2018: EF 20-25%, severe diffuse HK. Cardiac MRI showed EF 22%, relative preservation of RV function (EF 42%) and minimal LGE (only small area of non-coronary pattern LGE at the anterior RV insertion site).  -NYHA II. Volume status sound stable. Continue lasix as needed.  - Continue coreg 3.125 mg BID. He reports heart rate at home 60 so continue current dose of bb.   - Continue spiro 12.5 mg daily -Continue losartan 12.5 mg daily.  -I reviewed recent BMET. Renal function was stable.   2. CAD s/p CABG x 3 - Continue ASA and statin. - Anthony had something to do with CAD. Working with [place] to pay for everything. - No chest pain.   3.CKD III -Previous creatinine baseline 1.5-1.7 - Creatinine  1.4 on recent BMEt   COVID screen The patient does not have any symptoms that suggest any further testing/  screening at this time.  Social distancing reinforced today.  Patient Risk: After full review of this patients clinical status, I feel that they are at moderate risk for cardiac decompensation at this time.  Relevant cardiac medications were reviewed at length with the patient today. The patient does not have concerns regarding their medications at this time.   The following changes were made today:  None  Recommended follow-up:  6-8 weeks with Dr Aundra Dubin and an ECHO  Today, I have spent 15 minutes with the patient with telehealth technology discussing the above issues .    Jeanmarie Hubert, NP  10/03/2018 11:06 AM  [address] [place] [address] [place] [address] [phone] (office) [phone] (fax)

## 2018-10-03 NOTE — Telephone Encounter (Signed)
avs mailed Follow up 6-8 weeks with echo Order for echo placed

## 2018-10-05 ENCOUNTER — Other Ambulatory Visit: Payer: Self-pay | Admitting: Family Medicine

## 2018-10-28 ENCOUNTER — Ambulatory Visit: Payer: Medicare Other | Admitting: Family Medicine

## 2018-10-31 ENCOUNTER — Encounter: Payer: Self-pay | Admitting: Family Medicine

## 2018-10-31 ENCOUNTER — Ambulatory Visit (INDEPENDENT_AMBULATORY_CARE_PROVIDER_SITE_OTHER): Payer: Medicare Other | Admitting: Family Medicine

## 2018-10-31 ENCOUNTER — Other Ambulatory Visit: Payer: Self-pay

## 2018-10-31 VITALS — BP 106/66 | HR 74 | Temp 98.1°F | Resp 16 | Ht 70.0 in | Wt 148.2 lb

## 2018-10-31 DIAGNOSIS — I25118 Atherosclerotic heart disease of native coronary artery with other forms of angina pectoris: Secondary | ICD-10-CM | POA: Diagnosis not present

## 2018-10-31 DIAGNOSIS — N183 Chronic kidney disease, stage 3 unspecified: Secondary | ICD-10-CM

## 2018-10-31 DIAGNOSIS — D638 Anemia in other chronic diseases classified elsewhere: Secondary | ICD-10-CM

## 2018-10-31 DIAGNOSIS — I5020 Unspecified systolic (congestive) heart failure: Secondary | ICD-10-CM

## 2018-10-31 DIAGNOSIS — I10 Essential (primary) hypertension: Secondary | ICD-10-CM | POA: Diagnosis not present

## 2018-10-31 DIAGNOSIS — Z23 Encounter for immunization: Secondary | ICD-10-CM | POA: Diagnosis not present

## 2018-10-31 DIAGNOSIS — Z951 Presence of aortocoronary bypass graft: Secondary | ICD-10-CM | POA: Diagnosis not present

## 2018-10-31 LAB — LIPID PANEL
Cholesterol: 168 mg/dL (ref 0–200)
HDL: 50.6 mg/dL (ref >39.00)
LDL Cholesterol: 95 mg/dL (ref 0–99)
NonHDL: 117.51
Total CHOL/HDL Ratio: 3
Triglycerides: 113 mg/dL (ref 0.0–149.0)
VLDL: 22.6 mg/dL (ref 0.0–40.0)

## 2018-10-31 NOTE — Progress Notes (Signed)
Subjective  CC:  Chief Complaint  Patient presents with  . Hypertension    HPI: Luis Chen is a 78 y.o. male who presents to the office today to address the problems listed above in the chief complaint.  Hypertension f/u: Control is good . Pt reports he is doing well. taking medications as instructed, no medication side effects noted, no TIAs, no chest pain on exertion, no dyspnea on exertion, no swelling of ankles.  He reports that usually his blood pressure is a little bit higher than it was here today.  He has no lightheadedness or palpitations.  He follows with cardiology regularly. denies adverse effects from his BP medications. Compliance with medication is good.   Hyperlipidemia on statin.  Last checked in December.  Status post CABG.  Goal LDL less than 70  Mixed heart failure well compensated on diuretics and management per cardiology.  He denies lower extremity edema or shortness of breath.  No chest pain  Chronic kidney disease that has been stable.  Recently saw nephrology.  Flu shot today  Anemia of chronic disease, recent lab with stable hemoglobin.  Assessment  1. Anemia, chronic disease   2. Benign essential hypertension   3. Chronic kidney disease (CKD), stage III (moderate) (HCC)   4. Coronary artery disease involving native coronary artery of native heart with other form of angina pectoris ([place])   5. Systolic heart failure, unspecified HF chronicity ([place])   6. S/P CABG x 3   7. Need for immunization against influenza      Plan    Hypertension f/u: BP control is well controlled.  No changes in medications.  He will monitor at home and let us know if blood pressures are going too low if he is having symptoms of low blood pressure  Hyperlipidemia f/u: Recheck nonfasting lipids today.  Adjust statin if needed  CAD and CHF per cardiology  Immunizations updated today  Monitoring anemia of chronic disease, currently stable due to chronic kidney disease  which is also stable Education regarding management of these chronic disease states was given. Management strategies discussed on successive visits include dietary and exercise recommendations, goals of achieving and maintaining IBW, and lifestyle modifications aiming for adequate sleep and minimizing stressors.   Follow up: Return in about 6 months (around 05/03/2019) for complete physical, follow up Hypertension.  Orders Placed This Encounter  Procedures  . Flu Vaccine QUAD High Dose(Fluad)  . Lipid panel  . TSH   No orders of the defined types were placed in this encounter.     BP Readings from Last 3 Encounters:  10/31/18 106/66  10/03/18 124/70  05/18/18 106/74   Wt Readings from Last 3 Encounters:  10/31/18 148 lb 3.2 oz (67.2 kg)  10/03/18 144 lb (65.3 kg)  05/18/18 136 lb 12.8 oz (62.1 kg)    Lab Results  Component Value Date   CHOL 171 02/09/2018   CHOL 148 02/23/2017   CHOL  10/19/2008    180        ATP III CLASSIFICATION:  <200     mg/dL   Desirable  200-239  mg/dL   Borderline High  >=240    mg/dL   High          Lab Results  Component Value Date   HDL 49 02/09/2018   HDL 44.40 02/23/2017   HDL 58 10/19/2008   Lab Results  Component Value Date   LDLCALC 109 (H) 02/09/2018   LDLCALC 78  02/23/2017   LDLCALC (H) 10/19/2008    110        Total Cholesterol/HDL:CHD Risk Coronary Heart Disease Risk Table                     Men   Women  1/2 Average Risk   3.4   3.3  Average Risk       5.0   4.4  2 X Average Risk   9.6   7.1  3 X Average Risk  23.4   11.0        Use the calculated Patient Ratio above and the CHD Risk Table to determine the patient's CHD Risk.        ATP III CLASSIFICATION (LDL):  <100     mg/dL   Optimal  100-129  mg/dL   Near or Above                    Optimal  130-159  mg/dL   Borderline  160-189  mg/dL   High  >190     mg/dL   Very High   Lab Results  Component Value Date   TRIG 64 02/09/2018   TRIG 127.0 02/23/2017    TRIG 58 10/19/2008   Lab Results  Component Value Date   CHOLHDL 3.5 02/09/2018   CHOLHDL 3 02/23/2017   CHOLHDL 3.1 10/19/2008   No results found for: LDLDIRECT Lab Results  Component Value Date   CREATININE 1.4 (A) 09/14/2018   BUN 27 (A) 09/14/2018   NA 139 09/14/2018   K 4.5 09/14/2018   CL 106 07/21/2018   CO2 25 07/21/2018    The ASCVD Risk score (Goff DC Jr., et al., 2013) failed to calculate for the following reasons:   The 2013 ASCVD risk score is only valid for ages 19 to 67  I reviewed the patients updated PMH, FH, and SocHx.    Patient Active Problem List   Diagnosis Date Noted  . Malnutrition of moderate degree 02/20/2018  . S/P CABG x 3 02/17/2018  . CAD (coronary artery disease) 02/08/2018  . Systolic HF (heart failure) ([place]) 02/08/2018  . Nonischemic cardiomyopathy ([place]) 07/03/2015  . Pyogenic arthritis of left knee joint ([place])   . Anemia, chronic disease 07/01/2015  . Primary osteoarthritis of knees, bilateral 12/10/2014  . MGUS (monoclonal gammopathy of unknown significance) 06/30/2010  . Chronic kidney disease (CKD), stage III (moderate) ([place]) 03/24/2010  . Benign essential hypertension 10/31/2008  . Mixed hyperlipidemia 10/30/2008    Allergies: Ace inhibitors  Social History: Patient  reports that he has never smoked. He has never used smokeless tobacco. He reports current alcohol use. He reports that he does not use drugs.  Current Meds  Medication Sig  . acetaminophen (TYLENOL) 500 MG tablet Take 2 tablets (1,000 mg total) by mouth every 6 (six) hours. (Patient taking differently: Take 1,000 mg by mouth every 6 (six) hours as needed. )  . aspirin EC 325 MG EC tablet Take 1 tablet (325 mg total) by mouth daily.  . carvedilol (COREG) 3.125 MG tablet TAKE 1 TABLET BY MOUTH TWICE DAILY WITH A MEAL  . Ferrous Gluconate 324 (37.5 Fe) MG TABS TAKE 1 TABLET BY MOUTH TWICE DAILY WITH MEALS  . latanoprost (XALATAN) 0.005 % ophthalmic solution Place 1  drop into both eyes at bedtime.   Marland Kitchen losartan (COZAAR) 25 MG tablet Take 0.5 tablets (12.5 mg total) by mouth daily.  . rosuvastatin (CRESTOR) 40 MG tablet  Take 1 tablet (40 mg total) by mouth daily at 6 PM.  . spironolactone (ALDACTONE) 25 MG tablet Take 0.5 tablets (12.5 mg total) by mouth daily.  . traZODone (DESYREL) 150 MG tablet Take 100 mg by mouth at bedtime as needed for sleep.     Review of Systems: Cardiovascular: negative for chest pain, palpitations, leg swelling, orthopnea Respiratory: negative for SOB, wheezing or persistent cough Gastrointestinal: negative for abdominal pain Genitourinary: negative for dysuria or gross hematuria  Objective  Vitals: BP 106/66   Pulse 74   Temp 98.1 F (36.7 C) (Tympanic)   Resp 16   Ht 5\' 10"  (1.778 m)   Wt 148 lb 3.2 oz (67.2 kg)   SpO2 98%   BMI 21.26 kg/m  General: no acute distress, appears well Psych:  Alert and oriented, normal mood and affect HEENT:  Normocephalic, atraumatic, supple neck  Cardiovascular:  RRR without murmur. no edema Respiratory:  Good breath sounds bilaterally, CTAB with normal respiratory effort Skin:  Warm, no rashes Neurologic:   Mental status is normal  Commons side effects, risks, benefits, and alternatives for medications and treatment plan prescribed today were discussed, and the patient expressed understanding of the given instructions. Patient is instructed to call or message via MyChart if he/she has any questions or concerns regarding our treatment plan. No barriers to understanding were identified. We discussed Red Flag symptoms and signs in detail. Patient expressed understanding regarding what to do in case of urgent or emergency type symptoms.   Medication list was reconciled, printed and provided to the patient in AVS. Patient instructions and summary information was reviewed with the patient as documented in the AVS. This note was prepared with assistance of Dragon voice recognition software.  Occasional wrong-word or sound-a-like substitutions may have occurred due to the inherent limitations of voice recognition software

## 2018-10-31 NOTE — Patient Instructions (Signed)
Please return in 6 months for your annual complete physical; please come fasting.   I will release your lab results to you on your MyChart account with further instructions. Please reply with any questions.    If you have any questions or concerns, please don't hesitate to send me a message via MyChart or call the office at [phone]. Thank you for visiting with us today! It's our pleasure caring for you.  

## 2018-11-01 ENCOUNTER — Encounter: Payer: Self-pay | Admitting: *Deleted

## 2018-11-01 LAB — TSH: TSH: 1.54 u[IU]/mL (ref 0.35–4.50)

## 2018-11-05 ENCOUNTER — Other Ambulatory Visit: Payer: Self-pay | Admitting: Family Medicine

## 2018-11-24 ENCOUNTER — Ambulatory Visit: Payer: Medicare Other | Admitting: Cardiology

## 2018-11-24 ENCOUNTER — Encounter: Payer: Self-pay | Admitting: Cardiology

## 2018-11-24 NOTE — Progress Notes (Signed)
Cardiology Office Note   Date:  11/25/2018   ID:  Luis Chen, DOB 10/01/2035, MRN [id number]  PCP:  Leamon Arnt, MD  Cardiologist:   No primary care provider on file.   Chief Complaint  Patient presents with  . Congestive Heart Failure      History of Present Illness: Luis Chen is a 78 y.o. male who presents for follow up of systolic HF with improved EF from 25% in 2010 ->50%2011 due to NICM,nonobstructiveCAD, CKD, HL, and HTN.   He had cardiac cath on 02/2018 with severe multivessel disease.  He had CABG in Dec.  His EF on MRI was 22%.    He has been followed in the advanced heart failure clinic.  Today he presents and says he is feeling well.  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  He is doing an exercise bike.  He had rehab canceled because of the pandemic. The patient denies any new symptoms such as chest discomfort, neck or arm discomfort. There has been no new shortness of breath, PND or orthopnea. There have been no reported palpitations, presyncope or syncope.tion.   Past Medical History:  Diagnosis Date  . Anemia, chronic disease 07/01/2015  . Angina decubitus ([place])   . Benign essential hypertension 10/31/2008   Qualifier: Diagnosis of  By: Percival Spanish, MD, Farrel Gordon    . CAD (coronary artery disease)    non obstructive. Left main normal. LAd proximal long 25% stenosis, termingating as focal 50% prox to mid lesion. First & second diag were small, normal. Circumflex in proximal av groove had luminal irregularities.. Was large mid obtuse marg which was branching, scattered luminal irregul. Infer branch did supply some septal perforators. Was PDA,small & normal.  < 1.79m. There was prox 95% stenosis  . CARDIOMYOPATHY 11/27/2008  . CKD (chronic kidney disease)   . Hyperlipidemia    transient history, controlled on diet  . Hypertension   . Mixed hyperlipidemia 10/30/2008   Qualifier: Diagnosis of  By: TLovette Cliche CNA, [name]    . Non-traumatic rhabdomyolysis  07/03/2015  . Polyarthropathy 07/01/2015  . Primary osteoarthritis of knees, bilateral 12/10/2014  . Pyogenic arthritis of left knee joint ([facility]   . Swelling of left knee joint 07/01/2015  . Systolic HF (heart failure) ([facility] 02/08/2018    Past Surgical History:  Procedure Laterality Date  . CORONARY ARTERY BYPASS GRAFT N/A 02/17/2018   Procedure: CORONARY ARTERY BYPASS GRAFTING (CABG);  Surgeon: BGaye Pollack MD;  [facility]  Service: Open Heart Surgery;  Laterality: N/A;  Times 3 using endoscopically harvested right saphenous vein.    .Marland KitchenKNEE ARTHROSCOPY Left 07/01/2015   Procedure: Irrigation and debridement left knee arthroscopy;  Surgeon: TRenette Butters MD;  [facility]  Service: Orthopedics;  Laterality: Left;  . RIGHT/LEFT HEART CATH AND CORONARY ANGIOGRAPHY N/A 02/08/2018   Procedure: RIGHT/LEFT HEART CATH AND CORONARY ANGIOGRAPHY;  Surgeon: VJettie Booze MD;  [facility];  Service: Cardiovascular;  Laterality: N/A;  . TEE WITHOUT CARDIOVERSION N/A 02/17/2018   Procedure: TRANSESOPHAGEAL ECHOCARDIOGRAM (TEE);  Surgeon: BGaye Pollack MD;  [facility]  Service: Open Heart Surgery;  Laterality: N/A;     Current Outpatient Medications  Medication Sig Dispense Refill  . aspirin EC 325 MG EC tablet Take 1 tablet (325 mg total) by mouth daily. 30 tablet 0  . carvedilol (COREG) 3.125 MG tablet TAKE 1 TABLET BY MOUTH TWICE DAILY WITH A MEAL 180 tablet 0  .  feeding supplement, ENSURE ENLIVE, (ENSURE ENLIVE) LIQD Take 237 mLs by mouth 3 (three) times daily between meals. 237 mL 12  . Ferrous Gluconate 324 (37.5 Fe) MG TABS TAKE 1 TABLET BY MOUTH TWICE DAILY WITH MEALS 60 tablet 1  . latanoprost (XALATAN) 0.005 % ophthalmic solution Place 1 drop into both eyes at bedtime.     Marland Kitchen losartan (COZAAR) 50 MG tablet Take 1 tablet (50 mg total) by mouth daily. 90 tablet 3  . rosuvastatin (CRESTOR) 40 MG tablet TAKE 1 TABLET BY MOUTH ONCE DAILY AT  6PM 90  tablet 3  . spironolactone (ALDACTONE) 25 MG tablet Take 0.5 tablets (12.5 mg total) by mouth daily. 15 tablet 1  . traZODone (DESYREL) 150 MG tablet Take 100 mg by mouth at bedtime as needed for sleep.      No current facility-administered medications for this visit.     Allergies:   Ace inhibitors    ROS:  Please see the history of present illness.   Otherwise, review of systems are positive for none.   All other systems are reviewed and negative.    PHYSICAL EXAM: VS:  BP (!) 142/66   Pulse 63   Temp (!) 96.8 F (36 C) (Temporal)   Ht _0  (1.778 m)   Wt 152 lb (68.9 kg)   BMI 21.81 kg/m  , BMI Body mass index is 21.81 kg/m. GENERAL:  Well appearing NECK:  No jugular venous distention, waveform within normal limits, carotid upstroke brisk and symmetric, no bruits, no thyromegaly LYMPHATICS:  No cervical, inguinal adenopathy LUNGS:  Clear to auscultation bilaterally BACK:  No CVA tenderness CHEST:  Well healed sternotomy scar. HEART:  PMI not displaced or sustained,S1 and S2 within normal limits, no S3, no S4, no clicks, no rubs, no murmurs ABD:  Flat, positive bowel sounds normal in frequency in pitch, no bruits, no rebound, no guarding, no midline pulsatile mass, no hepatomegaly, no splenomegaly EXT:  2 plus pulses throughout, trace edema, no cyanosis no clubbing SKIN:  No rashes no nodules NEURO:  Cranial nerves II through XII grossly intact, motor grossly intact throughout PSYCH:  Cognitively intact, oriented to person place and time    EKG:  EKG is ordered today. The ekg ordered today demonstrates sinus rhythm, rate 63, axis within normal limits, intervals within normal limits, nonspecific lateral T wave inversions.   Recent Labs: 02/18/2018: Magnesium 2.6 04/22/2018: Platelets 185 09/14/2018: ALT 14; BUN 27; Creatinine 1.4; Hemoglobin 11.4; Potassium 4.5; Sodium 139 10/31/2018: TSH 1.54    Lipid Panel    Component Value Date/Time   CHOL 168 10/31/2018 1410    TRIG 113.0 10/31/2018 1410   HDL 50.60 10/31/2018 1410   CHOLHDL 3 10/31/2018 1410   VLDL 22.6 10/31/2018 1410   LDLCALC 95 10/31/2018 1410      Wt Readings from Last 3 Encounters:  11/25/18 152 lb (68.9 kg)  10/31/18 148 lb 3.2 oz (67.2 kg)  10/03/18 144 lb (65.3 kg)      Other studies Reviewed: Additional studies/ records that were reviewed today include: Hospital records, HF records. Review of the above records demonstrates:  Please see elsewhere in the note.     ASSESSMENT AND PLAN:  Chronic systolic CHF:     I think today we can titrate his Cozaar to 50 mg daily.  His heart rate is low so he probably will tolerate a lot of beta-blocker.  I am going to hold off on ordering an echocardiogram to see if  we can continue to do med titration and possibly switching to [facility] in the future.  He can then have an echocardiogram when we get the maximal therapy and might need to be considered for an ICD.  He is going to be seen in the [facility] in about 10 days.  Symptoms a message and he should get a be met.   CAD s/p CABG x 3 He is done well post bypass.  Plan as above.  CKD III Most recent creatinine was 1.4 and he should have this followed up with his med titration at his next visit.  DYSLIPIDEMIA He is on Crestor now.  His last LDL was 109.  He should get a fasting lipid profile when he presents at the next visit with a goal therapy L DL less than 70  Current medicines are reviewed at length with the patient today.  The patient does not have concerns regarding medicines.  The following changes have been made:  As above.   Labs/ tests ordered today include: None  Orders Placed This Encounter  Procedures  . EKG 12-Lead     Disposition:   FU with HF Clinic as scheduled    Signed, [name], MD  11/25/2018 11:09 AM    [place]

## 2018-11-25 ENCOUNTER — Ambulatory Visit: Payer: Medicare Other | Admitting: Cardiology

## 2018-11-25 ENCOUNTER — Other Ambulatory Visit: Payer: Self-pay

## 2018-11-25 ENCOUNTER — Ambulatory Visit (INDEPENDENT_AMBULATORY_CARE_PROVIDER_SITE_OTHER): Payer: Medicare Other | Admitting: Cardiology

## 2018-11-25 ENCOUNTER — Encounter: Payer: Self-pay | Admitting: Cardiology

## 2018-11-25 VITALS — BP 142/66 | HR 63 | Temp 96.8°F | Ht 70.0 in | Wt 152.0 lb

## 2018-11-25 DIAGNOSIS — N183 Chronic kidney disease, stage 3 unspecified: Secondary | ICD-10-CM

## 2018-11-25 DIAGNOSIS — I5022 Chronic systolic (congestive) heart failure: Secondary | ICD-10-CM | POA: Diagnosis not present

## 2018-11-25 DIAGNOSIS — I25119 Atherosclerotic heart disease of native coronary artery with unspecified angina pectoris: Secondary | ICD-10-CM | POA: Diagnosis not present

## 2018-11-25 DIAGNOSIS — I25118 Atherosclerotic heart disease of native coronary artery with other forms of angina pectoris: Secondary | ICD-10-CM | POA: Diagnosis not present

## 2018-11-25 MED ORDER — LOSARTAN POTASSIUM 50 MG PO TABS: 50.0000 mg | ORAL_TABLET | Freq: Every day | ORAL | 3 refills | Status: DC

## 2018-11-25 NOTE — Patient Instructions (Signed)
Medication Instructions:  INCREASE the Losartan to 50 mg once daily  If you need a refill on your cardiac medications before your next appointment, please call your pharmacy.   Lab work: None ordered If you have labs (blood work) drawn today and your tests are completely normal, you will receive your results only by: Marland Kitchen MyChart Message (if you have MyChart) OR . A paper copy in the mail If you have any lab test that is abnormal or we need to change your treatment, we will call you to review the results.  Testing/Procedures: None ordered  Follow-Up: . Please keep your follow up with the Heart Failure Clinic  Any Other Special Instructions Will Be Listed Below (If Applicable). We will cancel your ECHO

## 2018-12-05 ENCOUNTER — Other Ambulatory Visit: Payer: Self-pay

## 2018-12-05 ENCOUNTER — Other Ambulatory Visit (HOSPITAL_COMMUNITY): Payer: Medicare Other

## 2018-12-05 ENCOUNTER — Ambulatory Visit (HOSPITAL_COMMUNITY)
Admission: RE | Admit: 2018-12-05 | Discharge: 2018-12-05 | Disposition: A | Discharge status: Home / Self Care | Payer: Medicare Other | Source: Ambulatory Visit | Attending: Cardiology | Admitting: Cardiology

## 2018-12-05 VITALS — BP 104/58 | HR 74 | Wt 151.1 lb

## 2018-12-05 DIAGNOSIS — I5022 Chronic systolic (congestive) heart failure: Secondary | ICD-10-CM

## 2018-12-05 DIAGNOSIS — I25119 Atherosclerotic heart disease of native coronary artery with unspecified angina pectoris: Secondary | ICD-10-CM

## 2018-12-05 DIAGNOSIS — I2510 Atherosclerotic heart disease of native coronary artery without angina pectoris: Secondary | ICD-10-CM | POA: Insufficient documentation

## 2018-12-05 DIAGNOSIS — I130 Hypertensive heart and chronic kidney disease with heart failure and stage 1 through stage 4 chronic kidney disease, or unspecified chronic kidney disease: Secondary | ICD-10-CM | POA: Diagnosis not present

## 2018-12-05 DIAGNOSIS — Z79899 Other long term (current) drug therapy: Secondary | ICD-10-CM | POA: Diagnosis not present

## 2018-12-05 DIAGNOSIS — E785 Hyperlipidemia, unspecified: Secondary | ICD-10-CM | POA: Diagnosis not present

## 2018-12-05 DIAGNOSIS — N183 Chronic kidney disease, stage 3 (moderate): Secondary | ICD-10-CM | POA: Insufficient documentation

## 2018-12-05 DIAGNOSIS — Z8249 Family history of ischemic heart disease and other diseases of the circulatory system: Secondary | ICD-10-CM | POA: Diagnosis not present

## 2018-12-05 DIAGNOSIS — Z7982 Long term (current) use of aspirin: Secondary | ICD-10-CM | POA: Insufficient documentation

## 2018-12-05 DIAGNOSIS — Z951 Presence of aortocoronary bypass graft: Secondary | ICD-10-CM | POA: Diagnosis not present

## 2018-12-05 LAB — BASIC METABOLIC PANEL
Anion gap: 7 (ref 5–15)
BUN: 17 mg/dL (ref 8–23)
CO2: 22 mmol/L (ref 22–32)
Calcium: 9.2 mg/dL (ref 8.9–10.3)
Chloride: 112 mmol/L — ABNORMAL HIGH (ref 98–111)
Creatinine, Ser: 1.72 mg/dL — ABNORMAL HIGH (ref 0.61–1.24)
GFR calc Af Amer: 43 mL/min — ABNORMAL LOW (ref >60)
GFR calc non Af Amer: 37 mL/min — ABNORMAL LOW (ref >60)
Glucose, Bld: 91 mg/dL (ref 70–99)
Potassium: 4.2 mmol/L (ref 3.5–5.1)
Sodium: 141 mmol/L (ref 135–145)

## 2018-12-05 MED ORDER — EZETIMIBE 10 MG PO TABS: 10.0000 mg | ORAL_TABLET | Freq: Every day | ORAL | 11 refills | Status: DC

## 2018-12-05 MED ORDER — ENTRESTO 24-26 MG PO TABS: 1.0000 | ORAL_TABLET | Freq: Two times a day (BID) | ORAL | 5 refills | Status: DC

## 2018-12-05 NOTE — Patient Instructions (Addendum)
Labs done today. We will contact you only if your labs are abnormal.  Increase Coreg to 6.25 mg twice a day.   Increase spironolactone to 25 mg daily.   START Zetia 10mg (1 tab) by mouth once daily.  Your physician recommends that you schedule a follow-up appointment in:10 days with a lab only appointment, in 3 weeks with the Pharmacist and in 6 weeks with Dr. Aundra Dubin with an echo prior to your appointment.  Your physician has requested that you have an echocardiogram. Echocardiography is a painless test that uses sound waves to create images of your heart. It provides your doctor with information about the size and shape of your heart and how well your heart's chambers and valves are working. This procedure takes approximately one hour. There are no restrictions for this procedure.  At the [facility], you and your health needs are our priority. As part of our continuing mission to provide you with exceptional heart care, we have created designated Provider Care Teams. These Care Teams include your primary Cardiologist (physician) and Advanced Practice Providers (APPs- Physician Assistants and Nurse Practitioners) who all work together to provide you with the care you need, when you need it.   You may see any of the following providers on your designated Care Team at your next follow up: Marland Kitchen Dr Glori Bickers . Dr Loralie Champagne . Darrick Grinder, NP   Please be sure to bring in all your medications bottles to every appointment.

## 2018-12-05 NOTE — Progress Notes (Signed)
**Note Luis-Identified via Obfuscation** Advanced Heart Failure Clinic Note   PCP: Leamon Arnt, MD HF Cardiology: Dr Aundra Dubin  HPI:  Luis Chen is a 78 y.o. male with a history of systolic HF from mixed ischemic/nonischemic cardiomyopathy, CAD s/p CABG, CKD, HL, and HTN.  Patient had echo in 2010 with EF 25%, cath showed nonobstructive CAD.  Echo in 2011 showed EF up to 50%.    Echo in 12/19 showed EF back down to 20-25%.  Went for [facility] 02/08/18 which showed severe multivessel disease with preserved cardiac output.  Pt admitted 02/17/18 for planned CABG with SVG-LAD (LIMA inadequate) and sequential SVG-OM1/OM2.  He tolerated the procedure well. First degree block noted POD 4, but remained stable on low-dose Coreg.   He returns today for HF follow up. Creatinine down to 1.4.  He plans to go back to bowling and is back at work at [employer] [place] 3 [place]. No exertional dyspnea, able to do yardwork with no problems.  No chest pain.  Weight is up but eating better.  No orthopnea/PND, no palpitations.  No lightheadedness.    ECG (9/20, personally reviewed): NSR, anterolateral TWIs  Labs (7/20): K 4.5, creatinine 1.4, hgb 11.4 Labs (8/20): LDL 95, TSH normal  Review of systems complete and found to be negative unless listed in HPI.   Past Medical History 1. CAD: LHC in 12/19 with severe 3 vessel disease with occluded RCA.  - s/p CABG 02/17/18 (SVG-LAD, sequential SVG-OM1/OM2).  2. Chronic systolic CHF: Suspected mixed ischemic/nonischemic cardiomyopathy.  - Echo in 2010 with EF 25%, nonobstructive CAD on cath.   - Echo in 2011 with EF 50%.  - Echo 02/2018: EF 20-25%, severe diffuse HK.  Cath as above with severe 3VD.  - RHC (12/19): PA mean pressure 25, mean PCWP 23, CI 2.6.  - Cardiac MRI showed EF 22%, relative preservation of RV function (EF 42%) and minimal LGE (only small area of non-coronary pattern LGE at the anterior RV insertion site). - H/o angioedema with ACEI so not candidate for Entresto.   3. CKD Stage 3 4. HTN 5. Hyperlipidemia  Current Outpatient Medications  Medication Sig Dispense Refill  . aspirin EC 325 MG EC tablet Take 1 tablet (325 mg total) by mouth daily. 30 tablet 0  . carvedilol (COREG) 3.125 MG tablet TAKE 1 TABLET BY MOUTH TWICE DAILY WITH A MEAL 180 tablet 0  . feeding supplement, ENSURE ENLIVE, (ENSURE ENLIVE) LIQD Take 237 mLs by mouth 3 (three) times daily between meals. 237 mL 12  . Ferrous Gluconate 324 (37.5 Fe) MG TABS TAKE 1 TABLET BY MOUTH TWICE DAILY WITH MEALS 60 tablet 1  . latanoprost (XALATAN) 0.005 % ophthalmic solution Place 1 drop into both eyes at bedtime.     . rosuvastatin (CRESTOR) 40 MG tablet TAKE 1 TABLET BY MOUTH ONCE DAILY AT  6PM 90 tablet 3  . spironolactone (ALDACTONE) 25 MG tablet Take 0.5 tablets (12.5 mg total) by mouth daily. 15 tablet 1  . traZODone (DESYREL) 150 MG tablet Take 100 mg by mouth at bedtime as needed for sleep.     Marland Kitchen ezetimibe (ZETIA) 10 MG tablet Take 1 tablet (10 mg total) by mouth daily. 30 tablet 11  . sacubitril-valsartan (ENTRESTO) 24-26 MG Take 1 tablet by mouth 2 (two) times daily. 60 tablet 5   No current facility-administered medications for this encounter.     Allergies  Allergen Reactions  . Ace Inhibitors Swelling    Facial swelling  Social History   Socioeconomic History  . Marital status: Widowed    Spouse name: Not on file  . Number of children: Not on file  . Years of education: Not on file  . Highest education level: Not on file  Occupational History  . Not on file  Social Needs  . Financial resource strain: Not on file  . Food insecurity    Worry: Not on file    Inability: Not on file  . Transportation needs    Medical: Not on file    Non-medical: Not on file  Tobacco Use  . Smoking status: Never Smoker  . Smokeless tobacco: Never Used  Substance and Sexual Activity  . Alcohol use: Yes    Comment: one glass of wine or beer occassionaly  . Drug use: No  . Sexual  activity: Yes  Lifestyle  . Physical activity    Days per week: 4 days    Minutes per session: 30 min  . Stress: Not on file  Relationships  . Social Herbalist on phone: Not on file    Gets together: Not on file    Attends religious service: Not on file    Active member of club or organization: Not on file    Attends meetings of clubs or organizations: Not on file    Relationship status: Not on file  . Intimate partner violence    Fear of current or ex partner: Not on file    Emotionally abused: Not on file    Physically abused: Not on file    Forced sexual activity: Not on file  Other Topics Concern  . Not on file  Social History Narrative   Lives with grandson (7 yo), wife passed on 06/14/00. 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       02/11/2018 @ 1352 Pt discharged home with niece. No s/s of distress noted. Education completed, discharge instructions provided and understanding verbalized. Iv sites removed *2. Occlusive drsg per procedure-cdi. No c/o pain/nor distress.    Family History  Problem Relation Age of Onset  . Diabetes Mother   . Arthritis Mother   . Heart disease Mother   . Hyperlipidemia Mother   . Hypertension Mother   . Heart attack Mother 60  . Heart disease Brother        had in his 33s. Had CABG x2  . Hypothyroidism Daughter   . Stroke Daughter    Vitals:   12/05/18 1150  BP: (!) 104/58  Pulse: 74  SpO2: 97%  Weight: 68.5 kg (151 lb 2 oz)   Wt Readings from Last 3 Encounters:  12/05/18 68.5 kg (151 lb 2 oz)  11/25/18 68.9 kg (152 lb)  10/31/18 67.2 kg (148 lb 3.2 oz)    PHYSICAL EXAM: General: NAD, thin.  Neck: No JVD, no thyromegaly or thyroid nodule.  Lungs: Clear to auscultation bilaterally with normal respiratory effort. CV: Nondisplaced PMI.  Heart regular S1/S2, no S3/S4, no murmur.  No peripheral edema.  No carotid bruit.  Normal pedal pulses.  Abdomen: Soft, nontender,  no hepatosplenomegaly, no distention.  Skin: Intact without lesions or rashes.  Neurologic: Alert and oriented x 3.  Psych: Normal affect. Extremities: No clubbing or cyanosis.  HEENT: Normal.   ASSESSMENT & PLAN:  1. Chronic systolic CHF: Suspected mixed ischemic/nonischemic cardiomyopathy. Patient had EF down to 25% in the past without significant CAD on prior cath 06/14/2008),  EF then improved over time (up to 50% by 2011).  EF was back down in setting of significant coronary disease in 12/19, but there may be a nonischemic component as well. He is not a heavy drinker and there is a family history of CAD but not dilated cardiomyopathy. Cardiac output was preserved on 12/19 cath.Echo 12/19 showed EF 20-25%, severe diffuse HK. Cardiac MRI in 12/19 showed EF 22%, relative preservation of RV function (EF 42%) and minimal LGE (only small area of non-coronary pattern LGE at the anterior RV insertion site). NYHA class II symptoms.  He is not volume overloaded on exam.  - Increase Coreg to 6.25 mg bid.  - He is on losartan 50 mg daily.  Cannot transition to [facility] due to history of angioedema with ACEI.  - Increase spironolactone to 25 mg daily.  BMET today and again in 10 days.   - He does not need Lasix.  - Will have him get an echo at followup with me in 6 wks.  With advanced age, I am not sure that ICD will be much benefit for him.  On the other hand, he is quite functional.  He is not a CRT candidate with narrow QRS.  Will discuss with him after repeat echo.  2. CAD s/p CABG x 3: No chest pain.  - Continue ASA.  - Continue Crestor 40 mg daily.  With LDL 95 recently, will add Zetia 10 mg daily with Lipids in 2 months.  3. CKD stage 3: Creatinine improved recently, 1.4 when last checked.   Followup with HF pharmacist in 3 wks, see me in 6 wks with echo.   Loralie Champagne, MD 12/05/18   Greater than 50% of the 25 minute visit was spent in counseling/coordination of care regarding disease state  education, salt/fluid restriction, sliding scale diuretics, and medication compliance.

## 2018-12-06 ENCOUNTER — Telehealth (HOSPITAL_COMMUNITY): Payer: Self-pay

## 2018-12-06 MED ORDER — CARVEDILOL 6.25 MG PO TABS: 6.2500 mg | ORAL_TABLET | Freq: Two times a day (BID) | ORAL | 3 refills | Status: DC

## 2018-12-06 MED ORDER — LOSARTAN POTASSIUM 50 MG PO TABS: 50.0000 mg | ORAL_TABLET | Freq: Every day | ORAL | 3 refills | Status: DC

## 2018-12-06 MED ORDER — SPIRONOLACTONE 25 MG PO TABS: 25.0000 mg | ORAL_TABLET | Freq: Every day | ORAL | 3 refills | Status: DC

## 2018-12-06 NOTE — Telephone Encounter (Signed)
Called patient to advise of medication changes.  Also called pharmacy for med changes.  Prescriptions for Losartan 50mg  daily, Coreg 6.25mg  BID and Spiro 25mg  daily called in to pharmacy.  Entresto discontinued. Spoke with Conservator, museum/gallery.  Pt aware of same. Verbalized understanding.

## 2018-12-06 NOTE — Progress Notes (Signed)
Luis Chen spoke with patient see telephone note

## 2018-12-06 NOTE — Telephone Encounter (Signed)
-----   Message from Larey Dresser, MD sent at 12/05/2018 11:36 PM EDT ----- Mr Luis Chen had angioedema with ACEI, so we cannot have him start Entresto.  Instead of starting Entresto, keep him on losartan 50 mg daily.  I would like him to increase Coreg to 6.25 mg bid and spironolactone to 25 mg daily.

## 2018-12-15 ENCOUNTER — Ambulatory Visit (HOSPITAL_COMMUNITY)
Admission: RE | Admit: 2018-12-15 | Discharge: 2018-12-15 | Disposition: A | Discharge status: Home / Self Care | Payer: Medicare Other | Source: Ambulatory Visit | Attending: Internal Medicine | Admitting: Internal Medicine

## 2018-12-15 ENCOUNTER — Other Ambulatory Visit: Payer: Self-pay

## 2018-12-15 DIAGNOSIS — I5022 Chronic systolic (congestive) heart failure: Secondary | ICD-10-CM | POA: Diagnosis not present

## 2018-12-15 LAB — BASIC METABOLIC PANEL
Anion gap: 7 (ref 5–15)
BUN: 20 mg/dL (ref 8–23)
CO2: 24 mmol/L (ref 22–32)
Calcium: 9.1 mg/dL (ref 8.9–10.3)
Chloride: 108 mmol/L (ref 98–111)
Creatinine, Ser: 1.81 mg/dL — ABNORMAL HIGH (ref 0.61–1.24)
GFR calc Af Amer: 40 mL/min — ABNORMAL LOW (ref >60)
GFR calc non Af Amer: 35 mL/min — ABNORMAL LOW (ref >60)
Glucose, Bld: 89 mg/dL (ref 70–99)
Potassium: 4.5 mmol/L (ref 3.5–5.1)
Sodium: 139 mmol/L (ref 135–145)

## 2018-12-30 ENCOUNTER — Inpatient Hospital Stay (HOSPITAL_BASED_OUTPATIENT_CLINIC_OR_DEPARTMENT_OTHER): Payer: Medicare Other | Admitting: Oncology

## 2018-12-30 ENCOUNTER — Other Ambulatory Visit: Payer: Self-pay

## 2018-12-30 ENCOUNTER — Inpatient Hospital Stay: Payer: Medicare Other | Attending: Oncology

## 2018-12-30 VITALS — BP 109/62 | HR 71 | Temp 98.6°F | Resp 17 | Ht 70.0 in | Wt 151.3 lb

## 2018-12-30 DIAGNOSIS — N189 Chronic kidney disease, unspecified: Secondary | ICD-10-CM | POA: Insufficient documentation

## 2018-12-30 DIAGNOSIS — Z79899 Other long term (current) drug therapy: Secondary | ICD-10-CM | POA: Insufficient documentation

## 2018-12-30 DIAGNOSIS — D472 Monoclonal gammopathy: Secondary | ICD-10-CM | POA: Diagnosis not present

## 2018-12-30 DIAGNOSIS — D638 Anemia in other chronic diseases classified elsewhere: Secondary | ICD-10-CM

## 2018-12-30 DIAGNOSIS — I25119 Atherosclerotic heart disease of native coronary artery with unspecified angina pectoris: Secondary | ICD-10-CM

## 2018-12-30 DIAGNOSIS — D509 Iron deficiency anemia, unspecified: Secondary | ICD-10-CM | POA: Insufficient documentation

## 2018-12-30 LAB — CBC WITH DIFFERENTIAL (CANCER CENTER ONLY)
Abs Immature Granulocytes: 0.01 10*3/uL (ref 0.00–0.07)
Basophils Absolute: 0 10*3/uL (ref 0.0–0.1)
Basophils Relative: 1 %
Eosinophils Absolute: 0.3 10*3/uL (ref 0.0–0.5)
Eosinophils Relative: 6 %
HCT: 31.9 % — ABNORMAL LOW (ref 39.0–52.0)
Hemoglobin: 10.5 g/dL — ABNORMAL LOW (ref 13.0–17.0)
Immature Granulocytes: 0 %
Lymphocytes Relative: 42 %
Lymphs Abs: 2.4 10*3/uL (ref 0.7–4.0)
MCH: 31.7 pg (ref 26.0–34.0)
MCHC: 32.9 g/dL (ref 30.0–36.0)
MCV: 96.4 fL (ref 80.0–100.0)
Monocytes Absolute: 0.5 10*3/uL (ref 0.1–1.0)
Monocytes Relative: 9 %
Neutro Abs: 2.5 10*3/uL (ref 1.7–7.7)
Neutrophils Relative %: 42 %
Platelet Count: 170 10*3/uL (ref 150–400)
RBC: 3.31 MIL/uL — ABNORMAL LOW (ref 4.22–5.81)
RDW: 13.9 % (ref 11.5–15.5)
WBC Count: 5.9 10*3/uL (ref 4.0–10.5)
nRBC: 0 % (ref 0.0–0.2)

## 2018-12-30 LAB — CMP (CANCER CENTER ONLY)
ALT: 19 U/L (ref 0–44)
AST: 22 U/L (ref 15–41)
Albumin: 3.4 g/dL — ABNORMAL LOW (ref 3.5–5.0)
Alkaline Phosphatase: 68 U/L (ref 38–126)
Anion gap: 9 (ref 5–15)
BUN: 30 mg/dL — ABNORMAL HIGH (ref 8–23)
CO2: 20 mmol/L — ABNORMAL LOW (ref 22–32)
Calcium: 9 mg/dL (ref 8.9–10.3)
Chloride: 110 mmol/L (ref 98–111)
Creatinine: 1.86 mg/dL — ABNORMAL HIGH (ref 0.61–1.24)
GFR, Est AFR Am: 39 mL/min — ABNORMAL LOW (ref >60)
GFR, Estimated: 33 mL/min — ABNORMAL LOW (ref >60)
Glucose, Bld: 124 mg/dL — ABNORMAL HIGH (ref 70–99)
Potassium: 4.5 mmol/L (ref 3.5–5.1)
Sodium: 139 mmol/L (ref 135–145)
Total Bilirubin: 0.4 mg/dL (ref 0.3–1.2)
Total Protein: 7 g/dL (ref 6.5–8.1)

## 2018-12-30 NOTE — Progress Notes (Signed)
Hematology and Oncology Follow Up Visit  Luis Chen [id number] [birth year] 78 y.o. 12/30/2018 3:30 PM  CC: Luis Caroli, MD  Luis Chen, M.D.  Luis Breeding, MD, [facility]    Principle Diagnosis:  78 year old man with:  1.  Monoclonal gammopathy of undetermined significance diagnosed in 2012.  He has IgG subtype of 0.2 g/dL at the time of diagnosis.   2.  Multifactorial anemia related to renal disease and iron deficiency.  Current therapy: Active surveillance and iron replacement as needed.  Interim History: [name] is here for return evaluation.  Since the last visit, he reports feeling reasonably well without any recent complaints.  He did have a coronary artery bypass graft surgery in December 2019 after presenting with severe multivessel coronary disease and ventricular dysfunction in December 2019.  He has a recovered very well since that time and has not had any recent issues.  He denies chest pain palpitation.  He denies any pathological bone fractures or excessive fatigue.  He has resumed work-related duties at this time.  Patient denied any alteration mental status, neuropathy, confusion or dizziness.  Denies any headaches or lethargy.  Denies any night sweats, weight loss or changes in appetite.  Denied orthopnea, dyspnea on exertion or chest discomfort.  Denies shortness of breath, difficulty breathing hemoptysis or cough.  Denies any abdominal distention, nausea, early satiety or dyspepsia.  Denies any hematuria, frequency, dysuria or nocturia.  Denies any skin irritation, dryness or rash.  Denies any ecchymosis or petechiae.  Denies any lymphadenopathy or clotting.  Denies any heat or cold intolerance.  Denies any anxiety or depression.  Remaining review of system is negative.    Medications: Updated without any changes.  Marland Kitchen  aspirin EC 325 MG EC tablet, Take 1 tablet (325 mg total) by mouth daily., Disp: 30 tablet, Rfl: 0 .  carvedilol (COREG) 6.25 MG tablet, Take 1  tablet (6.25 mg total) by mouth 2 (two) times daily with a meal., Disp: 180 tablet, Rfl: 3 .  ezetimibe (ZETIA) 10 MG tablet, Take 1 tablet (10 mg total) by mouth daily., Disp: 30 tablet, Rfl: 11 .  feeding supplement, ENSURE ENLIVE, (ENSURE ENLIVE) LIQD, Take 237 mLs by mouth 3 (three) times daily between meals., Disp: 237 mL, Rfl: 12 .  Ferrous Gluconate 324 (37.5 Fe) MG TABS, TAKE 1 TABLET BY MOUTH TWICE DAILY WITH MEALS, Disp: 60 tablet, Rfl: 1 .  latanoprost (XALATAN) 0.005 % ophthalmic solution, Place 1 drop into both eyes at bedtime. , Disp: , Rfl:  .  losartan (COZAAR) 50 MG tablet, Take 1 tablet (50 mg total) by mouth daily., Disp: 90 tablet, Rfl: 3 .  rosuvastatin (CRESTOR) 40 MG tablet, TAKE 1 TABLET BY MOUTH ONCE DAILY AT  6PM, Disp: 90 tablet, Rfl: 3 .  spironolactone (ALDACTONE) 25 MG tablet, Take 1 tablet (25 mg total) by mouth daily., Disp: 90 tablet, Rfl: 3 .  traZODone (DESYREL) 150 MG tablet, Take 100 mg by mouth at bedtime as needed for sleep. , Disp: , Rfl:   Allergies:  Allergies  Allergen Reactions  . Ace Inhibitors Swelling    Facial swelling     Past Medical History, Surgical history, Social history, and Family History reviewed without changes.   Physical Exam:  Blood pressure 109/62, pulse 71, temperature 98.6 F (37 C), temperature source Oral, resp. rate 17, height _0  (1.778 m), weight 151 lb 4.8 oz (68.6 kg), SpO2 100 %.   ECOG: 0    General appearance: Comfortable  appearing without any discomfort Head: Normocephalic without any trauma Oropharynx: Mucous membranes are moist and pink without any thrush or ulcers. Eyes: Pupils are equal and round reactive to light. Lymph nodes: No cervical, supraclavicular, inguinal or axillary lymphadenopathy.   Heart:regular rate and rhythm.  S1 and S2 without leg edema. Lung: Clear without any rhonchi or wheezes.  No dullness to percussion. Abdomin: Soft, nontender, nondistended with good bowel sounds.  No  hepatosplenomegaly. Musculoskeletal: No joint deformity or effusion.  Full range of motion noted. Neurological: No deficits noted on motor, sensory and deep tendon reflex exam. Skin: No petechial rash or dryness.  Appeared moist.  Psychiatric: Mood and affect appeared appropriate.     Lab Results: Lab Results  Component Value Date   WBC 5.2 09/14/2018   HGB 11.4 (A) 09/14/2018   HCT 34 (A) 09/14/2018   MCV 93.7 04/22/2018   PLT 185 04/22/2018     Chemistry      Component Value Date/Time   NA 139 12/15/2018 1040   NA 139 09/14/2018   NA 139 06/12/2016 1313   K 4.5 12/15/2018 1040   K 4.4 06/12/2016 1313   CL 108 12/15/2018 1040   CO2 24 12/15/2018 1040   CO2 25 06/12/2016 1313   BUN 20 12/15/2018 1040   BUN 27 (A) 09/14/2018   BUN 17.9 06/12/2016 1313   CREATININE 1.81 (H) 12/15/2018 1040   CREATININE 1.2 06/12/2016 1313   GLU 92 09/14/2018      Component Value Date/Time   CALCIUM 9.1 12/15/2018 1040   CALCIUM 9.3 06/12/2016 1313   ALKPHOS 74 02/16/2018 1115   ALKPHOS 98 06/12/2016 1313   AST 20 09/14/2018   AST 18 06/12/2016 1313   ALT 14 09/14/2018   ALT 16 06/12/2016 1313   BILITOT 0.7 02/16/2018 1115   BILITOT 0.44 06/12/2016 1313      Results for Luis Chen (MRN [id number]) as of 12/30/2018 14:35  Ref. Range 09/14/2018 00:00  Iron Unknown 93  UIBC Unknown 157  TIBC Unknown 247  Ferritin Unknown 627   Results for Luis Chen (MRN [id number]) as of 12/30/2018 14:35  Ref. Range 07/02/2017 08:44  M Protein SerPl Elph-Mcnc Latest Ref Range: Not Observed g/dL 0.2 (H)  IFE 1 Unknown Comment  Globulin, Total Latest Ref Range: 2.2 - 3.9 g/dL 3.0  B-Globulin SerPl Elph-Mcnc Latest Ref Range: 0.7 - 1.3 g/dL 0.9  IgG (Immunoglobin G), Serum Latest Ref Range: 700 - 1,600 mg/dL 1,325  IgM (Immunoglobulin M), Srm Latest Ref Range: 15 - 143 mg/dL 167 (H)  IgA Latest Ref Range: 61 - 437 mg/dL 144    Assessment and plan:  78 year old man with the  following:  1.  MGUS diagnosed in 2012.  He was found to have 0.2 g/dL IgG subtype.   Protein studies obtained in April 2019 were personally reviewed and continues to show M spike of 0.2 g/dL which has not changed over the years.  His IgG level is normal with slightly elevated IgM.  The natural course of this disease was reviewed today and the risk of progression to multiple myeloma was assessed.  At this time he does not have any signs or symptoms to suggest disease progression I recommended active surveillance at this time.  He is agreeable to continue at this time.    2.  Multifactorial anemia: Iron studies in July 2020 showed normal stores.  His anemia has mostly related to chronically insufficiency.  His hemoglobin remains adequate without any  additional growth factor support or supplement at this time.   3.  Chronic renal insufficiency: Unrelated to plasma cell disorder.  3.  Follow-up: Will be in 1 year for repeat evaluation.   15  minutes was spent with the patient face-to-face today.  More than 50% of time was spent on reviewing his disease status, reviewing laboratory data as well as future plan of care.  Zola Button, MD 10/23/20203:30 PM

## 2019-01-02 LAB — MULTIPLE MYELOMA PANEL, SERUM
Albumin SerPl Elph-Mcnc: 3.6 g/dL (ref 2.9–4.4)
Albumin/Glob SerPl: 1.3 (ref 0.7–1.7)
Alpha 1: 0.2 g/dL (ref 0.0–0.4)
Alpha2 Glob SerPl Elph-Mcnc: 0.7 g/dL (ref 0.4–1.0)
B-Globulin SerPl Elph-Mcnc: 0.9 g/dL (ref 0.7–1.3)
Gamma Glob SerPl Elph-Mcnc: 1.3 g/dL (ref 0.4–1.8)
Globulin, Total: 3 g/dL (ref 2.2–3.9)
IgA: 141 mg/dL (ref 61–437)
IgG (Immunoglobin G), Serum: 1397 mg/dL (ref 603–1613)
IgM (Immunoglobulin M), Srm: 162 mg/dL — ABNORMAL HIGH (ref 15–143)
M Protein SerPl Elph-Mcnc: 0.3 g/dL — ABNORMAL HIGH
Total Protein ELP: 6.6 g/dL (ref 6.0–8.5)

## 2019-01-02 LAB — KAPPA/LAMBDA LIGHT CHAINS
Kappa free light chain: 75.8 mg/L — ABNORMAL HIGH (ref 3.3–19.4)
Kappa, lambda light chain ratio: 3.28 — ABNORMAL HIGH (ref 0.26–1.65)
Lambda free light chains: 23.1 mg/L (ref 5.7–26.3)

## 2019-01-10 ENCOUNTER — Telehealth: Payer: Self-pay | Admitting: Family Medicine

## 2019-01-10 NOTE — Telephone Encounter (Signed)
I left a message asking the patient to call and schedule Medicare AWV with Courtney ([facility] Health Coach).  If patient calls back, please schedule Medicare Wellness Visit at next available opening.  VDM (Dee-Dee) °

## 2019-01-11 DIAGNOSIS — N179 Acute kidney failure, unspecified: Secondary | ICD-10-CM | POA: Diagnosis not present

## 2019-01-11 DIAGNOSIS — I129 Hypertensive chronic kidney disease with stage 1 through stage 4 chronic kidney disease, or unspecified chronic kidney disease: Secondary | ICD-10-CM | POA: Diagnosis not present

## 2019-01-11 DIAGNOSIS — N183 Chronic kidney disease, stage 3 unspecified: Secondary | ICD-10-CM | POA: Diagnosis not present

## 2019-01-11 DIAGNOSIS — D472 Monoclonal gammopathy: Secondary | ICD-10-CM | POA: Diagnosis not present

## 2019-01-11 DIAGNOSIS — E611 Iron deficiency: Secondary | ICD-10-CM | POA: Diagnosis not present

## 2019-01-11 DIAGNOSIS — D631 Anemia in chronic kidney disease: Secondary | ICD-10-CM | POA: Diagnosis not present

## 2019-01-11 DIAGNOSIS — I429 Cardiomyopathy, unspecified: Secondary | ICD-10-CM | POA: Diagnosis not present

## 2019-01-16 ENCOUNTER — Ambulatory Visit (INDEPENDENT_AMBULATORY_CARE_PROVIDER_SITE_OTHER): Payer: Medicare Other

## 2019-01-16 ENCOUNTER — Other Ambulatory Visit: Payer: Self-pay

## 2019-01-16 VITALS — BP 124/72 | Temp 97.5°F | Ht 70.0 in | Wt 149.4 lb

## 2019-01-16 DIAGNOSIS — Z0000 Encounter for general adult medical examination without abnormal findings: Secondary | ICD-10-CM | POA: Diagnosis not present

## 2019-01-16 NOTE — Progress Notes (Signed)
I have reviewed the documentation from the recent AWV done by Courtney Slade, RN; I agree with the documentation and will follow up on any recommendations or abnormal findings as suggested.  

## 2019-01-16 NOTE — Patient Instructions (Signed)
Luis Chen , Thank you for taking time to come for your Medicare Wellness Visit. I appreciate your ongoing commitment to your health goals. Please review the following plan we discussed and let me know if I can assist you in the future.   Screening recommendations/referrals: Colorectal Screening: No longer indicated   Vision and Dental Exams: Recommended annual ophthalmology exams for early detection of glaucoma and other disorders of the eye Recommended annual dental exams for proper oral hygiene  Vaccinations: Influenza vaccine: completed 10/31/18 Pneumococcal vaccine: up to date; last 12/06/13 Tdap vaccine: recommended; Please call your insurance company to determine your out of pocket expense. You may receive this vaccine at your local pharmacy or Health Dept. Shingles vaccine: Please call your insurance company to determine your out of pocket expense for the Shingrix vaccine. You may receive this vaccine at your local pharmacy.  Advanced directives: Advance directives discussed with you today. I have provided a copy for you to complete at home and have notarized. Once this is complete please bring a copy in to our office so we can scan it into your chart.  Goals: Recommend to drink at least 6-8 8oz glasses of water per day and consume a balanced diet rich in fresh fruits and vegetables.   Next appointment: Please schedule your Annual Wellness Visit with your Nurse Health Advisor in one year.  Preventive Care 80 Years and Older, Male Preventive care refers to lifestyle choices and visits with your health care provider that can promote health and wellness. What does preventive care include?  A yearly physical exam. This is also called an annual well check.  Dental exams once or twice a year.  Routine eye exams. Ask your health care provider how often you should have your eyes checked.  Personal lifestyle choices, including:  Daily care of your teeth and gums.  Regular physical  activity.  Eating a healthy diet.  Avoiding tobacco and drug use.  Limiting alcohol use.  Practicing safe sex.  Taking low doses of aspirin every day if recommended by your health care provider..  Taking vitamin and mineral supplements as recommended by your health care provider. What happens during an annual well check? The services and screenings done by your health care provider during your annual well check will depend on your age, overall health, lifestyle risk factors, and family history of disease. Counseling  Your health care provider may ask you questions about your:  Alcohol use.  Tobacco use.  Drug use.  Emotional well-being.  Home and relationship well-being.  Sexual activity.  Eating habits.  History of falls.  Memory and ability to understand (cognition).  Work and work Statistician. Screening  You may have the following tests or measurements:  Height, weight, and BMI.  Blood pressure.  Lipid and cholesterol levels. These may be checked every 5 years, or more frequently if you are over 80 years old.  Skin check.  Lung cancer screening. You may have this screening every year starting at age 80 if you have a 30-pack-year history of smoking and currently smoke or have quit within the past 15 years.  Fecal occult blood test (FOBT) of the stool. You may have this test every year starting at age 80  Flexible sigmoidoscopy or colonoscopy. You may have a sigmoidoscopy every 5 years or a colonoscopy every 10 years starting at age 80.  Prostate cancer screening. Recommendations will vary depending on your family history and other risks.  Hepatitis C blood test.  Hepatitis B  blood test.  Sexually transmitted disease (STD) testing.  Diabetes screening. This is done by checking your blood sugar (glucose) after you have not eaten for a while (fasting). You may have this done every 1-3 years.  Abdominal aortic aneurysm (AAA) screening. You may need this  if you are a current or former smoker.  Osteoporosis. You may be screened starting at age 80 if you are at high risk. Talk with your health care provider about your test results, treatment options, and if necessary, the need for more tests. Vaccines  Your health care provider may recommend certain vaccines, such as:  Influenza vaccine. This is recommended every year.  Tetanus, diphtheria, and acellular pertussis (Tdap, Td) vaccine. You may need a Td booster every 10 years.  Zoster vaccine. You may need this after age 80  Pneumococcal 13-valent conjugate (PCV13) vaccine. One dose is recommended after age 80  Pneumococcal polysaccharide (PPSV23) vaccine. One dose is recommended after age 80 Talk to your health care provider about which screenings and vaccines you need and how often you need them. This information is not intended to replace advice given to you by your health care provider. Make sure you discuss any questions you have with your health care provider. Document Released: 03/22/2015 Document Revised: 11/13/2015 Document Reviewed: 12/25/2014 Elsevier Interactive Patient Education  2017 [employer] Prevention in the Home Falls can cause injuries. They can happen to people of all ages. There are many things you can do to make your home safe and to help prevent falls. What can I do on the outside of my home?  Regularly fix the edges of walkways and driveways and fix any cracks.  Remove anything that might make you trip as you walk through a door, such as a raised step or threshold.  Trim any bushes or trees on the path to your home.  Use bright outdoor lighting.  Clear any walking paths of anything that might make someone trip, such as rocks or tools.  Regularly check to see if handrails are loose or broken. Make sure that both sides of any steps have handrails.  Any raised decks and porches should have guardrails on the edges.  Have any leaves, snow, or ice  cleared regularly.  Use sand or salt on walking paths during winter.  Clean up any spills in your garage right away. This includes oil or grease spills. What can I do in the bathroom?  Use night lights.  Install grab bars by the toilet and in the tub and shower. Do not use towel bars as grab bars.  Use non-skid mats or decals in the tub or shower.  If you need to sit down in the shower, use a plastic, non-slip stool.  Keep the floor dry. Clean up any water that spills on the floor as soon as it happens.  Remove soap buildup in the tub or shower regularly.  Attach bath mats securely with double-sided non-slip rug tape.  Do not have throw rugs and other things on the floor that can make you trip. What can I do in the bedroom?  Use night lights.  Make sure that you have a light by your bed that is easy to reach.  Do not use any sheets or blankets that are too big for your bed. They should not hang down onto the floor.  Have a firm chair that has side arms. You can use this for support while you get dressed.  Do not have throw  rugs and other things on the floor that can make you trip. What can I do in the kitchen?  Clean up any spills right away.  Avoid walking on wet floors.  Keep items that you use a lot in easy-to-reach places.  If you need to reach something above you, use a strong step stool that has a grab bar.  Keep electrical cords out of the way.  Do not use floor polish or wax that makes floors slippery. If you must use wax, use non-skid floor wax.  Do not have throw rugs and other things on the floor that can make you trip. What can I do with my stairs?  Do not leave any items on the stairs.  Make sure that there are handrails on both sides of the stairs and use them. Fix handrails that are broken or loose. Make sure that handrails are as long as the stairways.  Check any carpeting to make sure that it is firmly attached to the stairs. Fix any carpet that  is loose or worn.  Avoid having throw rugs at the top or bottom of the stairs. If you do have throw rugs, attach them to the floor with carpet tape.  Make sure that you have a light switch at the top of the stairs and the bottom of the stairs. If you do not have them, ask someone to add them for you. What else can I do to help prevent falls?  Wear shoes that:  Do not have high heels.  Have rubber bottoms.  Are comfortable and fit you well.  Are closed at the toe. Do not wear sandals.  If you use a stepladder:  Make sure that it is fully opened. Do not climb a closed stepladder.  Make sure that both sides of the stepladder are locked into place.  Ask someone to hold it for you, if possible.  Clearly mark and make sure that you can see:  Any grab bars or handrails.  First and last steps.  Where the edge of each step is.  Use tools that help you move around (mobility aids) if they are needed. These include:  Canes.  Walkers.  Scooters.  Crutches.  Turn on the lights when you go into a dark area. Replace any light bulbs as soon as they burn out.  Set up your furniture so you have a clear path. Avoid moving your furniture around.  If any of your floors are uneven, fix them.  If there are any pets around you, be aware of where they are.  Review your medicines with your doctor. Some medicines can make you feel dizzy. This can increase your chance of falling. Ask your doctor what other things that you can do to help prevent falls. This information is not intended to replace advice given to you by your health care provider. Make sure you discuss any questions you have with your health care provider. Document Released: 12/20/2008 Document Revised: 08/01/2015 Document Reviewed: 03/30/2014 Elsevier Interactive Patient Education  2017 [employer].

## 2019-01-16 NOTE — Progress Notes (Signed)
Subjective:   Luis Chen is a 78 y.o. male who presents for Medicare Annual/Subsequent preventive examination.  Review of Systems:    Cardiac Risk Factors include: advanced age (>69men, >94 women);male gender;family history of premature cardiovascular disease;hypertension    Objective:    Vitals: BP 124/72    Temp (!) 97.5 F (36.4 C) (Temporal)    Ht 5\' 10"  (1.778 m)    Wt 149 lb 6.4 oz (67.8 kg)    BMI 21.44 kg/m   Body mass index is 21.44 kg/m.  Advanced Directives 01/16/2019 02/17/2018 02/16/2018 02/08/2018 08/25/2017 06/12/2016 11/08/2015  Does Patient Have a Medical Advance Directive? No No No No No No No  Type of Advance Directive - - - - - - -  Does patient want to make changes to medical advance directive? - - - - - - -  Copy of [place] in Chart? - - - - - - -  Would patient like information on creating a medical advance directive? No - Patient declined No - Patient declined No - Patient declined No - Patient declined Yes (MAU/Ambulatory/Procedural Areas - Information given) - Yes - Educational materials given    Tobacco Social History   Tobacco Use  Smoking Status Never Smoker  Smokeless Tobacco Never Used     Counseling given: Not Answered   Clinical Intake:  Pre-visit preparation completed: Yes  Pain : No/denies pain  Diabetes: No  How often do you need to have someone help you when you read instructions, pamphlets, or other written materials from your doctor or pharmacy?: 1 - Never  Interpreter Needed?: No  Information entered by :: Denman George LPN  Past Medical History:  Diagnosis Date   Anemia, chronic disease 07/01/2015   Angina decubitus ([place])    Benign essential hypertension 10/31/2008   Qualifier: Diagnosis of  By: Percival Spanish, MD, Farrel Gordon     CAD (coronary artery disease)    non obstructive. Left main normal. LAd proximal long 25% stenosis, termingating as focal 50% prox to mid lesion. First & second diag were  small, normal. Circumflex in proximal av groove had luminal irregularities.. Was large mid obtuse marg which was branching, scattered luminal irregul. Infer branch did supply some septal perforators. Was PDA,small & normal.  < 1.57mm. There was prox 95% stenosis   CARDIOMYOPATHY 11/27/2008   CKD (chronic kidney disease)    Hyperlipidemia    transient history, controlled on diet   Hypertension    Mixed hyperlipidemia 10/30/2008   Qualifier: Diagnosis of  By: Lovette Cliche, CNA, [name]     Non-traumatic rhabdomyolysis 07/03/2015   Polyarthropathy 07/01/2015   Primary osteoarthritis of knees, bilateral 12/10/2014   Pyogenic arthritis of left knee joint (HCC)    Swelling of left knee joint 99991111   Systolic HF (heart failure) ([place]) 02/08/2018   Past Surgical History:  Procedure Laterality Date   CORONARY ARTERY BYPASS GRAFT N/A 02/17/2018   Procedure: CORONARY ARTERY BYPASS GRAFTING (CABG);  Surgeon: [name], MD;  Location: [facility];  Service: Open Heart Surgery;  Laterality: N/A;  Times 3 using endoscopically harvested right saphenous vein.     KNEE ARTHROSCOPY Left 07/01/2015   Procedure: Irrigation and debridement left knee arthroscopy;  Surgeon: Renette Butters, MD;  Location: [facility];  Service: Orthopedics;  Laterality: Left;   RIGHT/LEFT HEART CATH AND CORONARY ANGIOGRAPHY N/A 02/08/2018   Procedure: RIGHT/LEFT HEART CATH AND CORONARY ANGIOGRAPHY;  Surgeon: Jettie Booze, MD;  Location: [facility]  CV [facility];  Service: Cardiovascular;  Laterality: N/A;   TEE WITHOUT CARDIOVERSION N/A 02/17/2018   Procedure: TRANSESOPHAGEAL ECHOCARDIOGRAM (TEE);  Surgeon: [name], MD;  Location: [facility];  Service: Open Heart Surgery;  Laterality: N/A;   Family History  Problem Relation Age of Onset   Diabetes Mother    Arthritis Mother    Heart disease Mother    Hyperlipidemia Mother    Hypertension Mother    Heart attack Mother 54   Heart disease Brother        had in his  59s. Had CABG x2   Hypothyroidism Daughter    Stroke Daughter    Social History   Socioeconomic History   Marital status: Widowed    Spouse name: Not on file   Number of children: Not on file   Years of education: Not on file   Highest education level: Not on file  Occupational History   Not on file  Social Needs   Financial resource strain: Not on file   Food insecurity    Worry: Not on file    Inability: Not on file   Transportation needs    Medical: Not on file    Non-medical: Not on file  Tobacco Use   Smoking status: Never Smoker   Smokeless tobacco: Never Used  Substance and Sexual Activity   Alcohol use: Yes    Comment: one glass of wine or beer occassionaly   Drug use: No   Sexual activity: Yes  Lifestyle   Physical activity    Days per week: 4 days    Minutes per session: 30 min   Stress: Not on file  Relationships   Social connections    Talks on phone: Not on file    Gets together: Not on file    Attends religious service: Not on file    Active member of club or organization: Not on file    Attends meetings of clubs or organizations: Not on file    Relationship status: Not on file  Other Topics Concern   Not on file  Social History Narrative   Lives with grandson (27 yo), wife passed on 06/18/00. 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    Enjoys bowling- Is in a bowling league    02/11/2018 @ 1352 Pt discharged home with niece. No s/s of distress noted. Education completed, discharge instructions provided and understanding verbalized. Iv sites removed *2. Occlusive drsg per procedure-cdi. No c/o pain/nor distress.    Outpatient Encounter Medications as of 01/16/2019  Medication Sig   aspirin EC 325 MG EC tablet Take 1 tablet (325 mg total) by mouth daily.   carvedilol (COREG) 6.25 MG tablet Take 1 tablet (6.25 mg total) by mouth 2 (two) times daily with a meal.   ezetimibe (ZETIA)  10 MG tablet Take 1 tablet (10 mg total) by mouth daily.   feeding supplement, ENSURE ENLIVE, (ENSURE ENLIVE) LIQD Take 237 mLs by mouth 3 (three) times daily between meals.   Ferrous Gluconate 324 (37.5 Fe) MG TABS TAKE 1 TABLET BY MOUTH TWICE DAILY WITH MEALS   latanoprost (XALATAN) 0.005 % ophthalmic solution Place 1 drop into both eyes at bedtime.    losartan (COZAAR) 50 MG tablet Take 1 tablet (50 mg total) by mouth daily.   rosuvastatin (CRESTOR) 40 MG tablet TAKE 1 TABLET BY MOUTH ONCE DAILY AT  6PM   sacubitril-valsartan (ENTRESTO) 24-26 MG Take 1 tablet  by mouth 2 (two) times daily.   spironolactone (ALDACTONE) 25 MG tablet Take 1 tablet (25 mg total) by mouth daily.   traZODone (DESYREL) 150 MG tablet Take 150 mg by mouth at bedtime as needed for sleep.    No facility-administered encounter medications on file as of 01/16/2019.     Activities of Daily Living In your present state of health, do you have any difficulty performing the following activities: 01/16/2019 02/17/2018  Hearing? N N  Vision? N N  Difficulty concentrating or making decisions? N N  Walking or climbing stairs? N N  Dressing or bathing? N N  Doing errands, shopping? N N  Preparing Food and eating ? N -  Using the Toilet? N -  In the past six months, have you accidently leaked urine? N -  Do you have problems with loss of bowel control? N -  Managing your Medications? N -  Managing your Finances? N -  Housekeeping or managing your Housekeeping? N -  Some recent data might be hidden    Patient Care Team: Leamon Arnt, MD as PCP - General (Family Medicine) Larey Dresser, MD as PCP - Advanced Heart Failure (Cardiology) Renette Butters, MD as Attending Physician (Orthopedic Surgery) [name], MD as Consulting Physician (Nephrology) Caryl Pina, MD (Hematology) [name], MD as Consulting Physician (Cardiology) [facility], Mathis Dad, MD as Consulting Physician  (Oncology) [facility] as Consulting Physician   Assessment:   This is a routine wellness examination for Sem.  Exercise Activities and Dietary recommendations Current Exercise Habits: Home exercise routine, Type of exercise: walking;Other - see comments(bowling), Time (Minutes): 45, Frequency (Times/Week): 5, Weekly Exercise (Minutes/Week): 225, Intensity: Mild  Goals     Patient Stated     Maintain current health by staying active.        Fall Risk Fall Risk  01/16/2019 10/31/2018 04/26/2018 08/25/2017 02/23/2017  Falls in the past year? 0 0 0 No No  Number falls in past yr: - 0 0 - -  Injury with Fall? 0 0 0 - -  Follow up Education provided;Falls prevention discussed;Falls evaluation completed Falls evaluation completed Falls evaluation completed - -   Is the patient's home free of loose throw rugs in walkways, pet beds, electrical cords, etc?   yes      Grab bars in the bathroom? yes      Handrails on the stairs?   yes      Adequate lighting?   yes  Timed Get Up and Go Performed: completed and within normal timeframe; no gait abnormalities noted   Depression Screen PHQ 2/9 Scores 01/16/2019 05/02/2018 08/25/2017 02/23/2017  PHQ - 2 Score 0 0 0 1    Cognitive Function- no cognitive concerns at this time  MMSE - Mini Mental State Exam 08/25/2017  Orientation to time 5  Orientation to Place 5  Registration 3  Attention/ Calculation 3  Recall 3  Language- name 2 objects 2  Language- repeat 1  Language- follow 3 step command 3  Language- read & follow direction 1  Write a sentence 1  Copy design 1  Total score 28     6CIT Screen 01/16/2019  What Year? 0 points  What month? 0 points  What time? 0 points  Count back from 20 0 points  Months in reverse 0 points  Repeat phrase 0 points  Total Score 0    Immunization History  Administered Date(s) Administered   Fluad Quad(high  Dose 65+) 10/31/2018   Influenza Split 11/03/2010   Influenza, High  Dose Seasonal PF 11/30/2011, 12/06/2013, 12/10/2014, 12/20/2015, 02/10/2018   Influenza,inj,Quad PF,6+ Mos 12/05/2012   Influenza-Unspecified 01/31/2017   Pneumococcal Conjugate-13 12/06/2013   Pneumococcal Polysaccharide-23 03/09/2008   Tdap 12/03/2008   Zoster 03/09/2008    Qualifies for Shingles Vaccine? Discussed and patient will check with pharmacy for coverage.  Patient education handout provided   Screening Tests Health Maintenance  Topic Date Due   TETANUS/TDAP  12/04/2018   INFLUENZA VACCINE  Completed   PNA vac Low Risk Adult  Completed   Cancer Screenings: Lung: Low Dose CT Chest recommended if Age 37-80 years, 30 pack-year currently smoking OR have quit w/in 15years. Patient does not qualify. Colorectal: No longer indicated       Plan:    I have personally reviewed and addressed the Medicare Annual Wellness questionnaire and have noted the following in the patients chart:  A. Medical and social history B. Use of alcohol, tobacco or illicit drugs  C. Current medications and supplements D. Functional ability and status E.  Nutritional status F.  Physical activity G. Advance directives H. List of other physicians I.  Hospitalizations, surgeries, and ER visits in previous 12 months J.  [place] such as hearing and vision if needed, cognitive and depression L. Referrals, records requested, and appointments- none   In addition, I have reviewed and discussed with patient certain preventive protocols, quality metrics, and best practice recommendations. A written personalized care plan for preventive services as well as general preventive health recommendations were provided to patient.   Signed,  Denman George, LPN  Nurse Health Advisor   Nurse Notes: Patient will check with the [facility] for update on vaccinations

## 2019-01-18 ENCOUNTER — Ambulatory Visit (HOSPITAL_COMMUNITY)
Admission: RE | Admit: 2019-01-18 | Discharge: 2019-01-18 | Disposition: A | Discharge status: Home / Self Care | Payer: Medicare Other | Source: Ambulatory Visit | Attending: Cardiology | Admitting: Cardiology

## 2019-01-18 ENCOUNTER — Other Ambulatory Visit: Payer: Self-pay

## 2019-01-18 ENCOUNTER — Ambulatory Visit (HOSPITAL_BASED_OUTPATIENT_CLINIC_OR_DEPARTMENT_OTHER)
Admission: RE | Admit: 2019-01-18 | Discharge: 2019-01-18 | Disposition: A | Discharge status: Home / Self Care | Payer: Medicare Other | Source: Ambulatory Visit | Attending: Cardiology | Admitting: Cardiology

## 2019-01-18 ENCOUNTER — Encounter (HOSPITAL_COMMUNITY): Payer: Self-pay | Admitting: Pharmacist

## 2019-01-18 ENCOUNTER — Encounter (HOSPITAL_COMMUNITY): Payer: Self-pay | Admitting: Cardiology

## 2019-01-18 VITALS — BP 103/58 | HR 58 | Wt 151.0 lb

## 2019-01-18 DIAGNOSIS — Z79899 Other long term (current) drug therapy: Secondary | ICD-10-CM | POA: Diagnosis not present

## 2019-01-18 DIAGNOSIS — I2510 Atherosclerotic heart disease of native coronary artery without angina pectoris: Secondary | ICD-10-CM | POA: Insufficient documentation

## 2019-01-18 DIAGNOSIS — Z951 Presence of aortocoronary bypass graft: Secondary | ICD-10-CM | POA: Insufficient documentation

## 2019-01-18 DIAGNOSIS — I25119 Atherosclerotic heart disease of native coronary artery with unspecified angina pectoris: Secondary | ICD-10-CM

## 2019-01-18 DIAGNOSIS — I5022 Chronic systolic (congestive) heart failure: Secondary | ICD-10-CM

## 2019-01-18 DIAGNOSIS — D472 Monoclonal gammopathy: Secondary | ICD-10-CM | POA: Insufficient documentation

## 2019-01-18 DIAGNOSIS — E785 Hyperlipidemia, unspecified: Secondary | ICD-10-CM | POA: Insufficient documentation

## 2019-01-18 DIAGNOSIS — I130 Hypertensive heart and chronic kidney disease with heart failure and stage 1 through stage 4 chronic kidney disease, or unspecified chronic kidney disease: Secondary | ICD-10-CM | POA: Insufficient documentation

## 2019-01-18 DIAGNOSIS — Z888 Allergy status to other drugs, medicaments and biological substances status: Secondary | ICD-10-CM | POA: Insufficient documentation

## 2019-01-18 DIAGNOSIS — Z823 Family history of stroke: Secondary | ICD-10-CM | POA: Insufficient documentation

## 2019-01-18 DIAGNOSIS — N183 Chronic kidney disease, stage 3 unspecified: Secondary | ICD-10-CM | POA: Insufficient documentation

## 2019-01-18 DIAGNOSIS — Z7982 Long term (current) use of aspirin: Secondary | ICD-10-CM | POA: Insufficient documentation

## 2019-01-18 DIAGNOSIS — Z8249 Family history of ischemic heart disease and other diseases of the circulatory system: Secondary | ICD-10-CM | POA: Insufficient documentation

## 2019-01-18 DIAGNOSIS — Z833 Family history of diabetes mellitus: Secondary | ICD-10-CM | POA: Insufficient documentation

## 2019-01-18 LAB — BASIC METABOLIC PANEL
Anion gap: 6 (ref 5–15)
BUN: 21 mg/dL (ref 8–23)
CO2: 24 mmol/L (ref 22–32)
Calcium: 9.2 mg/dL (ref 8.9–10.3)
Chloride: 108 mmol/L (ref 98–111)
Creatinine, Ser: 1.49 mg/dL — ABNORMAL HIGH (ref 0.61–1.24)
GFR calc Af Amer: 51 mL/min — ABNORMAL LOW (ref >60)
GFR calc non Af Amer: 44 mL/min — ABNORMAL LOW (ref >60)
Glucose, Bld: 86 mg/dL (ref 70–99)
Potassium: 4.4 mmol/L (ref 3.5–5.1)
Sodium: 138 mmol/L (ref 135–145)

## 2019-01-18 LAB — LIPID PANEL
Cholesterol: 126 mg/dL (ref 0–200)
HDL: 44 mg/dL (ref >40)
LDL Cholesterol: 68 mg/dL (ref 0–99)
Total CHOL/HDL Ratio: 2.9 RATIO
Triglycerides: 70 mg/dL (ref <150)
VLDL: 14 mg/dL (ref 0–40)

## 2019-01-18 MED ORDER — LOSARTAN POTASSIUM 50 MG PO TABS: 50.0000 mg | ORAL_TABLET | Freq: Every day | ORAL | 3 refills | Status: DC

## 2019-01-18 NOTE — Progress Notes (Signed)
Advanced Heart Failure Clinic Note   PCP: Leamon Arnt, MD HF Cardiology: Dr Aundra Dubin  HPI:  Luis Chen is a 78 y.o. male with a history of systolic HF from mixed ischemic/nonischemic cardiomyopathy, CAD s/p CABG, CKD, HL, and HTN.  Patient had echo in 2010 with EF 25%, cath showed nonobstructive CAD.  Echo in 2011 showed EF up to 50%.    Echo in 12/19 showed EF back down to 20-25%.  Went for [facility] 02/08/18 which showed severe multivessel disease with preserved cardiac output.  Pt admitted 02/17/18 for planned CABG with SVG-LAD (LIMA inadequate) and sequential SVG-OM1/OM2.  He tolerated the procedure well. First degree block noted POD 4, but remained stable on low-dose Coreg.   Echo was done today and reviewed, EF up to 50-55% with basal inferior hypokinesis and mildly hypokinetic RV.    He returns today for HF follow up. He is doing well overall.  No significant exertional dyspnea.  No chest pain.  No orthopnea/PND.  Weight is down 6 lbs.  Of note, he is on Entresto, but has a history of angioedema with ACEI.    Labs (7/20): K 4.5, creatinine 1.4, hgb 11.4 Labs (8/20): LDL 95, TSH normal Labs (10/20): K 4.5, creatinine 1.86  Review of systems complete and found to be negative unless listed in HPI.   Past Medical History 1. CAD: LHC in 12/19 with severe 3 vessel disease with occluded RCA.  - s/p CABG 02/17/18 (SVG-LAD, sequential SVG-OM1/OM2).  2. Chronic systolic CHF: Suspected mixed ischemic/nonischemic cardiomyopathy.  - Echo in 2010 with EF 25%, nonobstructive CAD on cath.   - Echo in 2011 with EF 50%.  - Echo 02/2018: EF 20-25%, severe diffuse HK.  Cath as above with severe 3VD.  - RHC (12/19): PA mean pressure 25, mean PCWP 23, CI 2.6.  - Cardiac MRI showed EF 22%, relative preservation of RV function (EF 42%) and minimal LGE (only small area of non-coronary pattern LGE at the anterior RV insertion site). - H/o angioedema with ACEI so not candidate for Entresto.  -  Echo (11/20): EF up to 50-55% with basal inferior hypokinesis and mildly hypokinetic RV. 3. CKD Stage 3 4. HTN 5. Hyperlipidemia 6. MGUS  Current Outpatient Medications  Medication Sig Dispense Refill  . aspirin EC 325 MG EC tablet Take 1 tablet (325 mg total) by mouth daily. 30 tablet 0  . carvedilol (COREG) 6.25 MG tablet Take 1 tablet (6.25 mg total) by mouth 2 (two) times daily with a meal. 180 tablet 3  . ezetimibe (ZETIA) 10 MG tablet Take 1 tablet (10 mg total) by mouth daily. 30 tablet 11  . feeding supplement, ENSURE ENLIVE, (ENSURE ENLIVE) LIQD Take 237 mLs by mouth 3 (three) times daily between meals. 237 mL 12  . Ferrous Gluconate 324 (37.5 Fe) MG TABS TAKE 1 TABLET BY MOUTH TWICE DAILY WITH MEALS 60 tablet 1  . latanoprost (XALATAN) 0.005 % ophthalmic solution Place 1 drop into both eyes at bedtime.     . rosuvastatin (CRESTOR) 40 MG tablet TAKE 1 TABLET BY MOUTH ONCE DAILY AT  6PM 90 tablet 3  . spironolactone (ALDACTONE) 25 MG tablet Take 1 tablet (25 mg total) by mouth daily. 90 tablet 3  . traZODone (DESYREL) 150 MG tablet Take 150 mg by mouth at bedtime as needed for sleep.     Marland Kitchen losartan (COZAAR) 50 MG tablet Take 1 tablet (50 mg total) by mouth daily. 90 tablet 3   No current  facility-administered medications for this encounter.     Allergies  Allergen Reactions  . Ace Inhibitors Swelling    Facial swelling   . Entresto [Sacubitril-Valsartan] Swelling    Angioedema with ACE Inhibitor      Social History   Socioeconomic History  . Marital status: Widowed    Spouse name: Not on file  . Number of children: Not on file  . Years of education: Not on file  . Highest education level: Not on file  Occupational History  . Not on file  Social Needs  . Financial resource strain: Not on file  . Food insecurity    Worry: Not on file    Inability: Not on file  . Transportation needs    Medical: Not on file    Non-medical: Not on file  Tobacco Use  . Smoking  status: Never Smoker  . Smokeless tobacco: Never Used  Substance and Sexual Activity  . Alcohol use: Yes    Comment: one glass of wine or beer occassionaly  . Drug use: No  . Sexual activity: Yes  Lifestyle  . Physical activity    Days per week: 4 days    Minutes per session: 30 min  . Stress: Not on file  Relationships  . Social Herbalist on phone: Not on file    Gets together: Not on file    Attends religious service: Not on file    Active member of club or organization: Not on file    Attends meetings of clubs or organizations: Not on file    Relationship status: Not on file  . Intimate partner violence    Fear of current or ex partner: Not on file    Emotionally abused: Not on file    Physically abused: Not on file    Forced sexual activity: Not on file  Other Topics Concern  . Not on file  Social History Narrative   Lives with grandson (80 yo), wife passed on 2000-07-01. 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    Enjoys bowling- Is in a bowling league    02/11/2018 @ 1352 Pt discharged home with niece. No s/s of distress noted. Education completed, discharge instructions provided and understanding verbalized. Iv sites removed *2. Occlusive drsg per procedure-cdi. No c/o pain/nor distress.    Family History  Problem Relation Age of Onset  . Diabetes Mother   . Arthritis Mother   . Heart disease Mother   . Hyperlipidemia Mother   . Hypertension Mother   . Heart attack Mother 4  . Heart disease Brother        had in his 55s. Had CABG x2  . Hypothyroidism Daughter   . Stroke Daughter    Vitals:   01/18/19 0906  BP: (!) 103/58  Pulse: (!) 58  SpO2: 98%  Weight: 68.5 kg (151 lb)   Wt Readings from Last 3 Encounters:  01/18/19 68.5 kg (151 lb)  01/16/19 67.8 kg (149 lb 6.4 oz)  12/30/18 68.6 kg (151 lb 4.8 oz)    PHYSICAL EXAM: General: NAD Neck: No JVD, no thyromegaly or thyroid nodule.  Lungs:  Clear to auscultation bilaterally with normal respiratory effort. CV: Nondisplaced PMI.  Heart regular S1/S2, no S3/S4, no murmur.  No peripheral edema.  No carotid bruit.  Normal pedal pulses.  Abdomen: Soft, nontender, no hepatosplenomegaly, no distention.  Skin: Intact without lesions or rashes.  Neurologic: Alert and  oriented x 3.  Psych: Normal affect. Extremities: No clubbing or cyanosis.  HEENT: Normal.   ASSESSMENT & PLAN:  1. Chronic systolic CHF: Suspected mixed ischemic/nonischemic cardiomyopathy. Patient had EF down to 25% in the past without significant CAD on prior cath (2010), EF then improved over time (up to 50% by 2011).  EF was back down in setting of significant coronary disease in 12/19, but there may be a nonischemic component as well. He is not a heavy drinker and there is a family history of CAD but not dilated cardiomyopathy. Cardiac output was preserved on 12/19 cath.Echo 12/19 showed EF 20-25%, severe diffuse HK. Cardiac MRI in 12/19 showed EF 22%, relative preservation of RV function (EF 42%) and minimal LGE (only small area of non-coronary pattern LGE at the anterior RV insertion site). He had CABG in 12/19.  Echo today shows improvement in EF to 50-55%.  NYHA class II symptoms.  He is not volume overloaded on exam.  - Continue Coreg 6.25 mg bid.  - He should not be taking Entresto given history of angioedema with ACEI.  Stop Entresto, start back on losartan 50 mg daily (was on this in the past).  BMET today and again in 10 days.  - Continue spironolactone 25 mg daily.   - He does not need Lasix.  - No ICD given improved EF (and advanced age).  2. CAD s/p CABG x 3: No chest pain.  - Continue ASA.  - Continue Crestor and Zetia.  Check lipids today.  3. CKD stage 3: BMET today.   Followup with me in 6 months.   Loralie Champagne, MD 01/18/19

## 2019-01-18 NOTE — Progress Notes (Signed)
  Echocardiogram 2D Echocardiogram has been performed.  Luis Chen 01/18/2019, 9:04 AM

## 2019-01-18 NOTE — Patient Instructions (Signed)
Labs done today. We will contact you only if your labs are abnormal.  DISCONTINUE Entresto  START Losartan 50mg (1 tab) by mouth once daily.  Your physician recommends that you schedule a follow-up appointment in: 10 days for a lab only appointment and in 6 months for an appointment with Dr. Aundra Dubin. Please contact our office 2-3 months prior to schedule an appointment.  At the [facility], you and your health needs are our priority. As part of our continuing mission to provide you with exceptional heart care, we have created designated Provider Care Teams. These Care Teams include your primary Cardiologist (physician) and Advanced Practice Providers (APPs- Physician Assistants and Nurse Practitioners) who all work together to provide you with the care you need, when you need it.   You may see any of the following providers on your designated Care Team at your next follow up: Marland Kitchen Dr Glori Bickers . Dr Loralie Champagne . Darrick Grinder, NP . Lyda Jester, PA   Please be sure to bring in all your medications bottles to every appointment.

## 2019-01-27 ENCOUNTER — Other Ambulatory Visit: Payer: Self-pay

## 2019-01-27 ENCOUNTER — Ambulatory Visit (HOSPITAL_COMMUNITY)
Admission: RE | Admit: 2019-01-27 | Discharge: 2019-01-27 | Disposition: A | Discharge status: Home / Self Care | Payer: Medicare Other | Source: Ambulatory Visit | Attending: Cardiology | Admitting: Cardiology

## 2019-01-27 DIAGNOSIS — I5022 Chronic systolic (congestive) heart failure: Secondary | ICD-10-CM | POA: Diagnosis not present

## 2019-01-27 LAB — BASIC METABOLIC PANEL
Anion gap: 9 (ref 5–15)
BUN: 25 mg/dL — ABNORMAL HIGH (ref 8–23)
CO2: 24 mmol/L (ref 22–32)
Calcium: 9.3 mg/dL (ref 8.9–10.3)
Chloride: 108 mmol/L (ref 98–111)
Creatinine, Ser: 1.73 mg/dL — ABNORMAL HIGH (ref 0.61–1.24)
GFR calc Af Amer: 42 mL/min — ABNORMAL LOW (ref >60)
GFR calc non Af Amer: 36 mL/min — ABNORMAL LOW (ref >60)
Glucose, Bld: 137 mg/dL — ABNORMAL HIGH (ref 70–99)
Potassium: 4.4 mmol/L (ref 3.5–5.1)
Sodium: 141 mmol/L (ref 135–145)

## 2019-03-17 ENCOUNTER — Telehealth: Payer: Self-pay | Admitting: Family Medicine

## 2019-03-17 NOTE — Telephone Encounter (Signed)
Yes, this would be a good idea. I recommend getting vaccinated ASAP

## 2019-03-17 NOTE — Telephone Encounter (Signed)
Patient notified of message

## 2019-03-17 NOTE — Telephone Encounter (Signed)
Patient called in stated the [facility] called him to set up to get the Covid-19 Vaccine. Patient would like to see what Dr. Jonni Sanger thinks if he should get it or not. Please advise.

## 2019-03-17 NOTE — Telephone Encounter (Signed)
Please advise 

## 2019-03-18 ENCOUNTER — Other Ambulatory Visit: Payer: Self-pay | Admitting: *Deleted

## 2019-03-18 DIAGNOSIS — Z1152 Encounter for screening for COVID-19: Secondary | ICD-10-CM

## 2019-03-19 LAB — NOVEL CORONAVIRUS, NAA: SARS-CoV-2, NAA: DETECTED — AB

## 2019-03-20 ENCOUNTER — Telehealth: Payer: Self-pay | Admitting: Family Medicine

## 2019-03-20 NOTE — Telephone Encounter (Signed)
Patient called in just wanting to notify Dr.Andy that he now has covid.

## 2019-03-20 NOTE — Telephone Encounter (Signed)
See message below °

## 2019-03-21 ENCOUNTER — Telehealth: Payer: Self-pay | Admitting: Family Medicine

## 2019-03-21 NOTE — Telephone Encounter (Signed)
Spoke with patient about his covid symptoms and patient was questioning when he should get vaccinated since he has covid. Pat advised to wait after symptoms clear and he was going to get retested to find out when he was negative first.

## 2019-03-21 NOTE — Telephone Encounter (Signed)
Patient wanted to call back in to let us know that he is covid positive and wanted to speak to someone about this.

## 2019-05-04 ENCOUNTER — Encounter: Payer: Medicare Other | Admitting: Family Medicine

## 2019-05-05 ENCOUNTER — Ambulatory Visit (INDEPENDENT_AMBULATORY_CARE_PROVIDER_SITE_OTHER): Payer: Medicare Other | Admitting: Family Medicine

## 2019-05-05 ENCOUNTER — Encounter: Payer: Self-pay | Admitting: Family Medicine

## 2019-05-05 ENCOUNTER — Other Ambulatory Visit: Payer: Self-pay

## 2019-05-05 VITALS — BP 110/62 | HR 71 | Temp 98.0°F | Ht 70.0 in | Wt 150.6 lb

## 2019-05-05 DIAGNOSIS — D638 Anemia in other chronic diseases classified elsewhere: Secondary | ICD-10-CM

## 2019-05-05 DIAGNOSIS — I10 Essential (primary) hypertension: Secondary | ICD-10-CM | POA: Diagnosis not present

## 2019-05-05 DIAGNOSIS — I5022 Chronic systolic (congestive) heart failure: Secondary | ICD-10-CM

## 2019-05-05 DIAGNOSIS — I25118 Atherosclerotic heart disease of native coronary artery with other forms of angina pectoris: Secondary | ICD-10-CM

## 2019-05-05 DIAGNOSIS — D472 Monoclonal gammopathy: Secondary | ICD-10-CM | POA: Diagnosis not present

## 2019-05-05 DIAGNOSIS — I428 Other cardiomyopathies: Secondary | ICD-10-CM | POA: Diagnosis not present

## 2019-05-05 DIAGNOSIS — E782 Mixed hyperlipidemia: Secondary | ICD-10-CM

## 2019-05-05 DIAGNOSIS — N183 Chronic kidney disease, stage 3 unspecified: Secondary | ICD-10-CM | POA: Diagnosis not present

## 2019-05-05 LAB — CBC WITH DIFFERENTIAL/PLATELET
Basophils Absolute: 0 10*3/uL (ref 0.0–0.1)
Basophils Relative: 0.5 % (ref 0.0–3.0)
Eosinophils Absolute: 0.4 10*3/uL (ref 0.0–0.7)
Eosinophils Relative: 7.3 % — ABNORMAL HIGH (ref 0.0–5.0)
HCT: 30.2 % — ABNORMAL LOW (ref 39.0–52.0)
Hemoglobin: 10.2 g/dL — ABNORMAL LOW (ref 13.0–17.0)
Lymphocytes Relative: 38.7 % (ref 12.0–46.0)
Lymphs Abs: 2.2 10*3/uL (ref 0.7–4.0)
MCHC: 33.8 g/dL (ref 30.0–36.0)
MCV: 96.8 fl (ref 78.0–100.0)
Monocytes Absolute: 0.5 10*3/uL (ref 0.1–1.0)
Monocytes Relative: 9.1 % (ref 3.0–12.0)
Neutro Abs: 2.5 10*3/uL (ref 1.4–7.7)
Neutrophils Relative %: 44.4 % (ref 43.0–77.0)
Platelets: 192 10*3/uL (ref 150.0–400.0)
RBC: 3.12 Mil/uL — ABNORMAL LOW (ref 4.22–5.81)
RDW: 14.5 % (ref 11.5–15.5)
WBC: 5.7 10*3/uL (ref 4.0–10.5)

## 2019-05-05 LAB — POCT URINALYSIS DIPSTICK
Bilirubin, UA: NEGATIVE
Blood, UA: NEGATIVE
Glucose, UA: NEGATIVE
Ketones, UA: NEGATIVE
Leukocytes, UA: NEGATIVE
Nitrite, UA: NEGATIVE
Protein, UA: NEGATIVE
Spec Grav, UA: 1.02 (ref 1.010–1.025)
Urobilinogen, UA: 0.2 E.U./dL
pH, UA: 6 (ref 5.0–8.0)

## 2019-05-05 LAB — COMPREHENSIVE METABOLIC PANEL
ALT: 21 U/L (ref 0–53)
AST: 22 U/L (ref 0–37)
Albumin: 3.6 g/dL (ref 3.5–5.2)
Alkaline Phosphatase: 66 U/L (ref 39–117)
BUN: 25 mg/dL — ABNORMAL HIGH (ref 6–23)
CO2: 26 mEq/L (ref 19–32)
Calcium: 9.6 mg/dL (ref 8.4–10.5)
Chloride: 109 mEq/L (ref 96–112)
Creatinine, Ser: 1.55 mg/dL — ABNORMAL HIGH (ref 0.40–1.50)
GFR: 52.37 mL/min — ABNORMAL LOW (ref >60.00)
Glucose, Bld: 93 mg/dL (ref 70–99)
Potassium: 4.6 mEq/L (ref 3.5–5.1)
Sodium: 140 mEq/L (ref 135–145)
Total Bilirubin: 0.4 mg/dL (ref 0.2–1.2)
Total Protein: 6.8 g/dL (ref 6.0–8.3)

## 2019-05-05 LAB — LIPID PANEL
Cholesterol: 129 mg/dL (ref 0–200)
HDL: 41.6 mg/dL (ref >39.00)
LDL Cholesterol: 75 mg/dL (ref 0–99)
NonHDL: 87.1
Total CHOL/HDL Ratio: 3
Triglycerides: 59 mg/dL (ref 0.0–149.0)
VLDL: 11.8 mg/dL (ref 0.0–40.0)

## 2019-05-05 LAB — TSH: TSH: 2.02 u[IU]/mL (ref 0.35–4.50)

## 2019-05-05 NOTE — Patient Instructions (Addendum)
Please return in 6 months for follow up of your hypertension.  I will release your lab results to you on your MyChart account with further instructions. Please reply with any questions.   I'm so glad you are doing so well. I hope you find some wonderful hobbies in your well deserved retirement.   If you have any questions or concerns, please don't hesitate to send me a message via MyChart or call the office at [phone]. Thank you for visiting with [place] today! It's our pleasure caring for you.

## 2019-05-05 NOTE — Progress Notes (Signed)
Subjective  Chief Complaint  Patient presents with  . Annual Exam    fasting  . Hypertension    readings are are <140/80 at home. Takes losartan  . Hyperlipidemia    Takes rosuvastain. no side effects    HPI: Luis Chen is a 79 y.o. male who presents to [place] [facility] today for a Male Wellness Visit. He also has the concerns and/or needs as listed above in the chief complaint. These will be addressed in addition to the Health Maintenance Visit.   Wellness Visit: annual visit with health maintenance review and exam    HM: 78 and imms and screens up to date. Continues to do well. AWV , had first covid vaccine 2/11; due for next 3/11. Hasn't had tdap updated.  Body mass index is 21.61 kg/m. Wt Readings from Last 3 Encounters:  05/05/19 150 lb 9.6 oz (68.3 kg)  01/18/19 151 lb (68.5 kg)  01/16/19 149 lb 6.4 oz (67.8 kg)   Chronic disease management visit and/or acute problem visit:  Cad and cardiomyopathy/CHF: reviewed cards notes and results from November: EF back up to 50% and no volume overload. No chest pain or sob. On medications and tolerating well.   HLD: on statin w/o AEs. Fasting today. Has been at goal.   HTN: on medications and reports good control. Feeling well. Taking medications w/o adverse effects. No symptoms of CHF, angina; no palpitations, sob, cp or lower extremity edema. Compliant with meds.   Anemia of ckd: energy levels are good. Retired now. A little bored  MGUS: follows with heme.   Patient Active Problem List   Diagnosis Date Noted  . S/P CABG x 3 02/17/2018  . CAD (coronary artery disease) 02/08/2018  . Systolic HF (heart failure) ([place]) 02/08/2018  . Nonischemic cardiomyopathy ([place]) 07/03/2015  . Anemia, chronic disease 07/01/2015  . Primary osteoarthritis of knees, bilateral 12/10/2014  . MGUS (monoclonal gammopathy of unknown significance) 06/30/2010  . Chronic kidney disease (CKD), stage III (moderate) 03/24/2010    . Benign essential hypertension 10/31/2008  . Mixed hyperlipidemia 10/30/2008   Health Maintenance  Topic Date Due  . INFLUENZA VACCINE  Completed  . PNA vac Low Risk Adult  Completed  . TETANUS/TDAP  Discontinued   Immunization History  Administered Date(s) Administered  . Fluad Quad(high Dose 65+) 10/31/2018  . Influenza Split 11/03/2010  . Influenza, High Dose Seasonal PF 11/30/2011, 12/06/2013, 12/10/2014, 12/20/2015, 02/10/2018  . Influenza,inj,Quad PF,6+ Mos 12/05/2012  . Influenza-Unspecified 01/31/2017  . Pneumococcal Conjugate-13 12/06/2013  . Pneumococcal Polysaccharide-23 03/09/2008  . Tdap 12/03/2008  . Zoster 03/09/2008   We updated and reviewed the patient's past history in detail and it is documented below. Allergies: Patient is allergic to ace inhibitors and entresto [sacubitril-valsartan]. Past Medical History  has a past medical history of Anemia, chronic disease (07/01/2015), Angina decubitus ([place]), Benign essential hypertension (10/31/2008), CAD (coronary artery disease), CARDIOMYOPATHY (11/27/2008), CKD (chronic kidney disease), Hyperlipidemia, Hypertension, Mixed hyperlipidemia (10/30/2008), Non-traumatic rhabdomyolysis (07/03/2015), Polyarthropathy (07/01/2015), Primary osteoarthritis of knees, bilateral (12/10/2014), Pyogenic arthritis of left knee joint ([place]), Swelling of left knee joint (99991111), and Systolic HF (heart failure) ([place]) (02/08/2018). Past Surgical History Patient  has a past surgical history that includes Knee arthroscopy (Left, 07/01/2015); RIGHT/LEFT HEART CATH AND CORONARY ANGIOGRAPHY (N/A, 02/08/2018); Coronary artery bypass graft (N/A, 02/17/2018); and TEE without cardioversion (N/A, 02/17/2018). Social History Patient  reports that he has never smoked. He has never used smokeless tobacco. He reports current alcohol use.  He reports that he does not use drugs. Family History family history includes Arthritis in his mother; Diabetes in his mother;  Heart attack (age of onset: 61) in his mother; Heart disease in his brother and mother; Hyperlipidemia in his mother; Hypertension in his mother; Hypothyroidism in his daughter; Stroke in his daughter. Review of Systems: Constitutional: negative for fever or malaise Ophthalmic: negative for photophobia, double vision or loss of vision Cardiovascular: negative for chest pain, dyspnea on exertion, or new LE swelling Respiratory: negative for SOB or persistent cough Gastrointestinal: negative for abdominal pain, change in bowel habits or melena Genitourinary: negative for dysuria or gross hematuria Musculoskeletal: negative for new gait disturbance or muscular weakness Integumentary: negative for new or persistent rashes Neurological: negative for TIA or stroke symptoms Psychiatric: negative for SI or delusions Allergic/Immunologic: negative for hives  Patient Care Team    Relationship Specialty Notifications Start End  Leamon Arnt, MD PCP - General Family Medicine  07/01/15   Larey Dresser, MD PCP - Advanced Heart Failure Cardiology  12/05/18   Renette Butters, MD Attending Physician Orthopedic Surgery  02/23/17   [name], MD Consulting Physician Nephrology  02/23/17   Caryl Pina, MD  Hematology  02/23/17   [name], MD Consulting Physician Cardiology  02/23/17   [facility]    08/25/17   Wyatt Portela, MD Consulting Physician Oncology  01/16/19   Cl[facility] Consulting Physician   01/16/19    Objective  Vitals: BP 110/62 (BP Location: Left Arm, Patient Position: Sitting, Cuff Size: Normal)   Pulse 71   Temp 98 F (36.7 C) (Temporal)   Ht 5\' 10"  (1.778 m)   Wt 150 lb 9.6 oz (68.3 kg)   SpO2 95%   BMI 21.61 kg/m  General:  Well developed, well nourished, no acute distress  Psych:  Alert and orientedx3,normal mood and affect HEENT:  Normocephalic, atraumatic, non-icteric sclera, PERR supple neck without adenopathy, mass or  thyromegaly Cardiovascular:  Normal S1, S2, RRR without gallop, rub or murmur, +2 distal pulses in bilateral upper and lower extremities. Respiratory:  Good breath sounds bilaterally, CTAB with normal respiratory effort Gastrointestinal: normal bowel sounds, soft, non-tender, no noted masses. No HSM MSK: no deformities, contusions. Joints are without erythema or swelling. Skin:  Warm, no rashes or suspicious lesions noted Neurologic:    Mental status is normal. No tremor    Assessment  1. Benign essential hypertension   2. Anemia, chronic disease   3. Stage 3 chronic kidney disease, unspecified whether stage 3a or 3b CKD   4. MGUS (monoclonal gammopathy of unknown significance)   5. Mixed hyperlipidemia   6. Nonischemic cardiomyopathy ([place])   7. Chronic systolic heart failure ([place])   8. Coronary artery disease involving native coronary artery of native heart with other form of angina pectoris [facility])      Plan  Male Wellness Visit:  Age appropriate Health Maintenance and Prevention measures were discussed with patient. Included topics are cancer screening recommendations, ways to keep healthy (see AVS) including dietary and exercise recommendations, regular eye and dental care, use of seat belts, and avoidance of moderate alcohol use and tobacco use.   BMI: discussed patient's BMI and encouraged positive lifestyle modifications to help get to or maintain a target BMI.  HM needs and immunizations were addressed and ordered. See below for orders. See HM and immunization section for updates. May try to get Tdap at [facility]; will wait until after he receives  his second covid vaccine.   Routine labs and screening tests ordered including cmp, cbc and lipids where appropriate.  Discussed recommendations regarding Vit D and calcium supplementation (see AVS)  Chronic disease f/u and/or acute problem visit: (deemed necessary to be done in addition to the wellness visit):  CAD/CHF: well compensated  and no angina. Continue current meds. bp is controlled. Volume status is good  Anemia: monitoring. Has heme surveillance as well.  CKD: no sxs. Recheck today  HTN: well controlled.   Follow up: 6 months for htn and recheck.   Commons side effects, risks, benefits, and alternatives for medications and treatment plan prescribed today were discussed, and the patient expressed understanding of the given instructions. Patient is instructed to call or message via MyChart if he/she has any questions or concerns regarding our treatment plan. No barriers to understanding were identified. We discussed Red Flag symptoms and signs in detail. Patient expressed understanding regarding what to do in case of urgent or emergency type symptoms.   Medication list was reconciled, printed and provided to the patient in AVS. Patient instructions and summary information was reviewed with the patient as documented in the AVS. This note was prepared with assistance of Dragon voice recognition software. Occasional wrong-word or sound-a-like substitutions may have occurred due to the inherent limitations of voice recognition software  This visit occurred during the SARS-CoV-2 public health emergency.  Safety protocols were in place, including screening questions prior to the visit, additional usage of staff PPE, and extensive cleaning of exam room while observing appropriate contact time as indicated for disinfecting solutions.   Orders Placed This Encounter  Procedures  . CBC with Differential/Platelet  . Comprehensive metabolic panel  . Lipid panel  . TSH  . POCT urinalysis dipstick   No orders of the defined types were placed in this encounter.

## 2019-05-29 IMAGING — CR DG CHEST 2V
2 series · 2 of 2 positions shown · non-contrast
Comparison: 07/03/2015

CLINICAL DATA: Coronary artery disease

EXAM:
CHEST - 2 VIEW

[w chest pa]
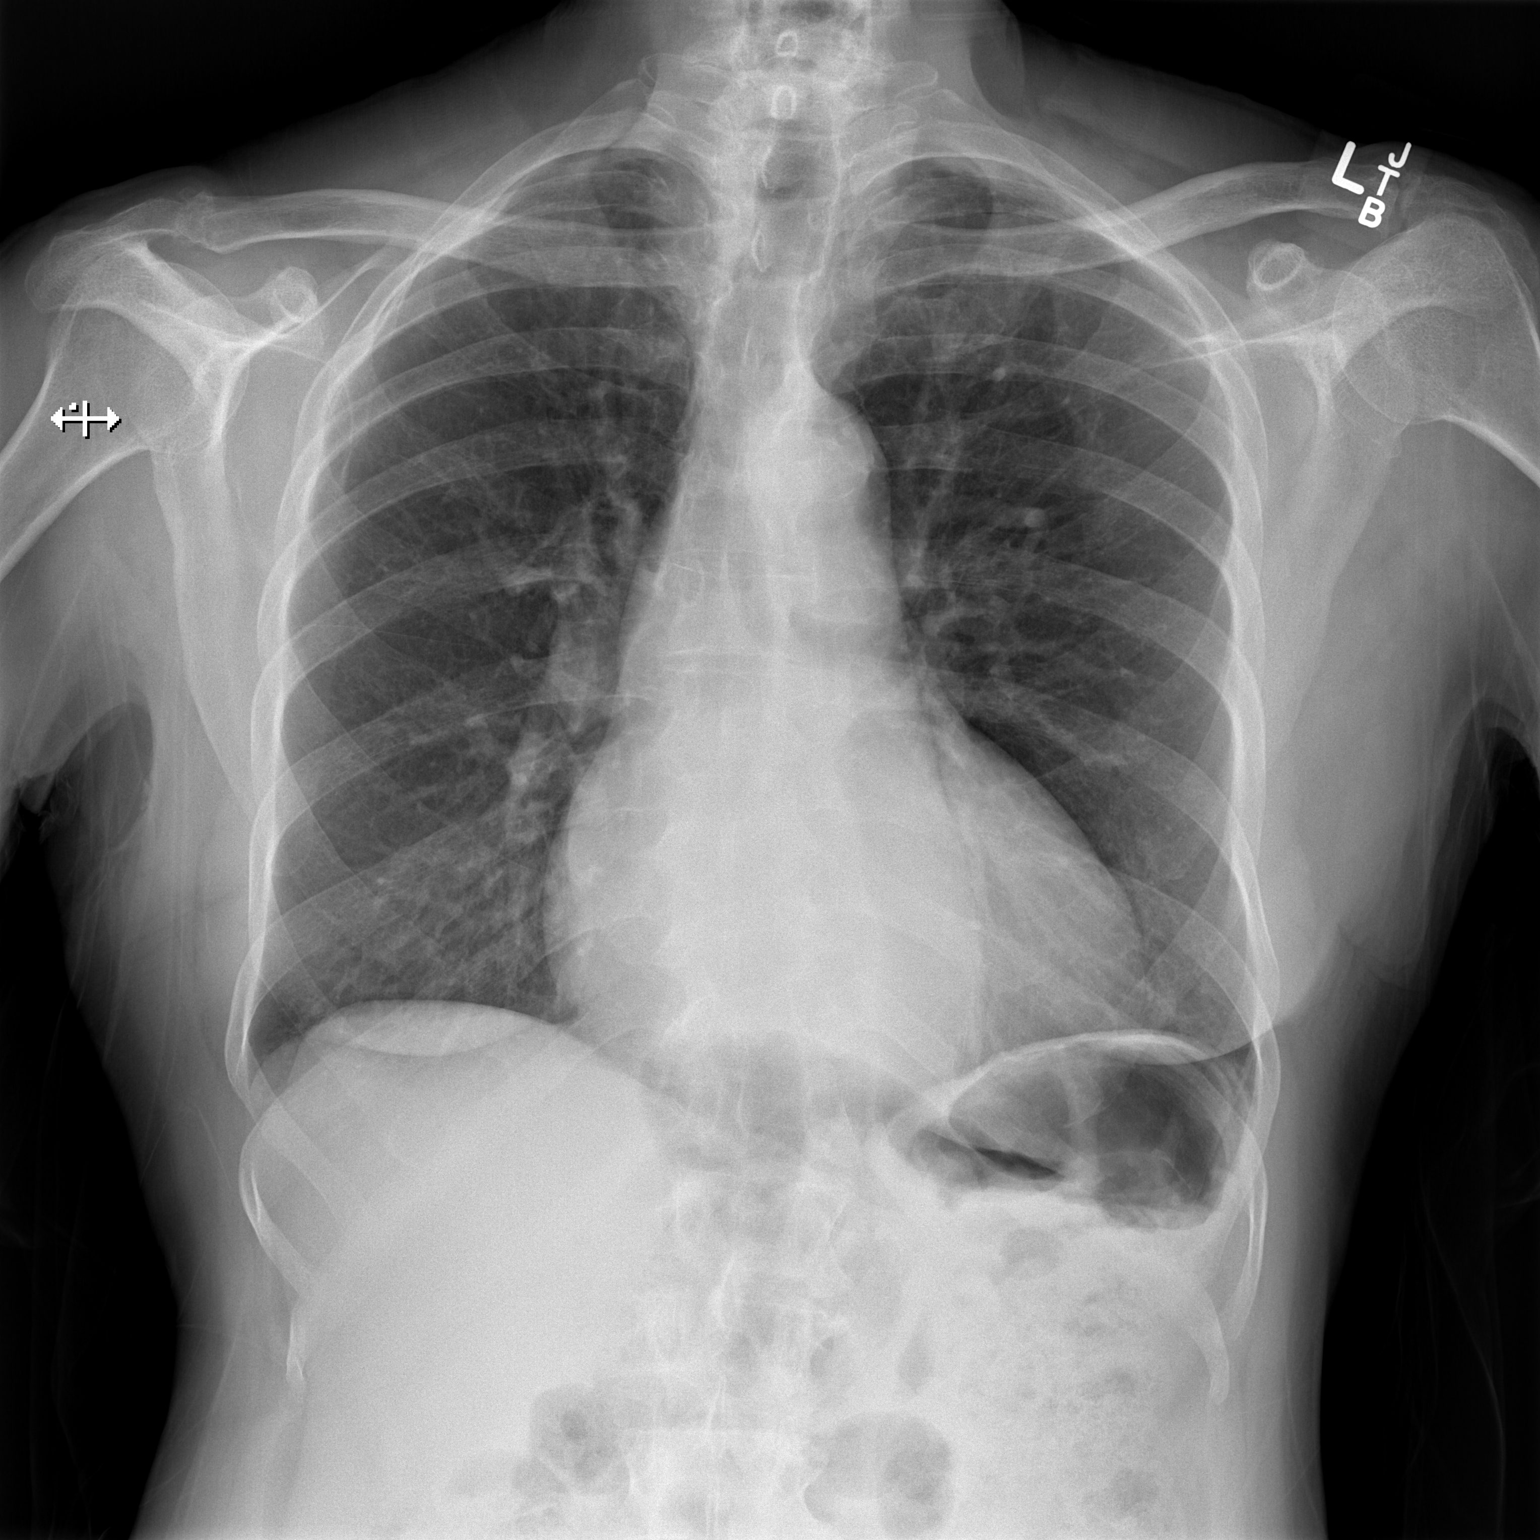

[w chest lat]
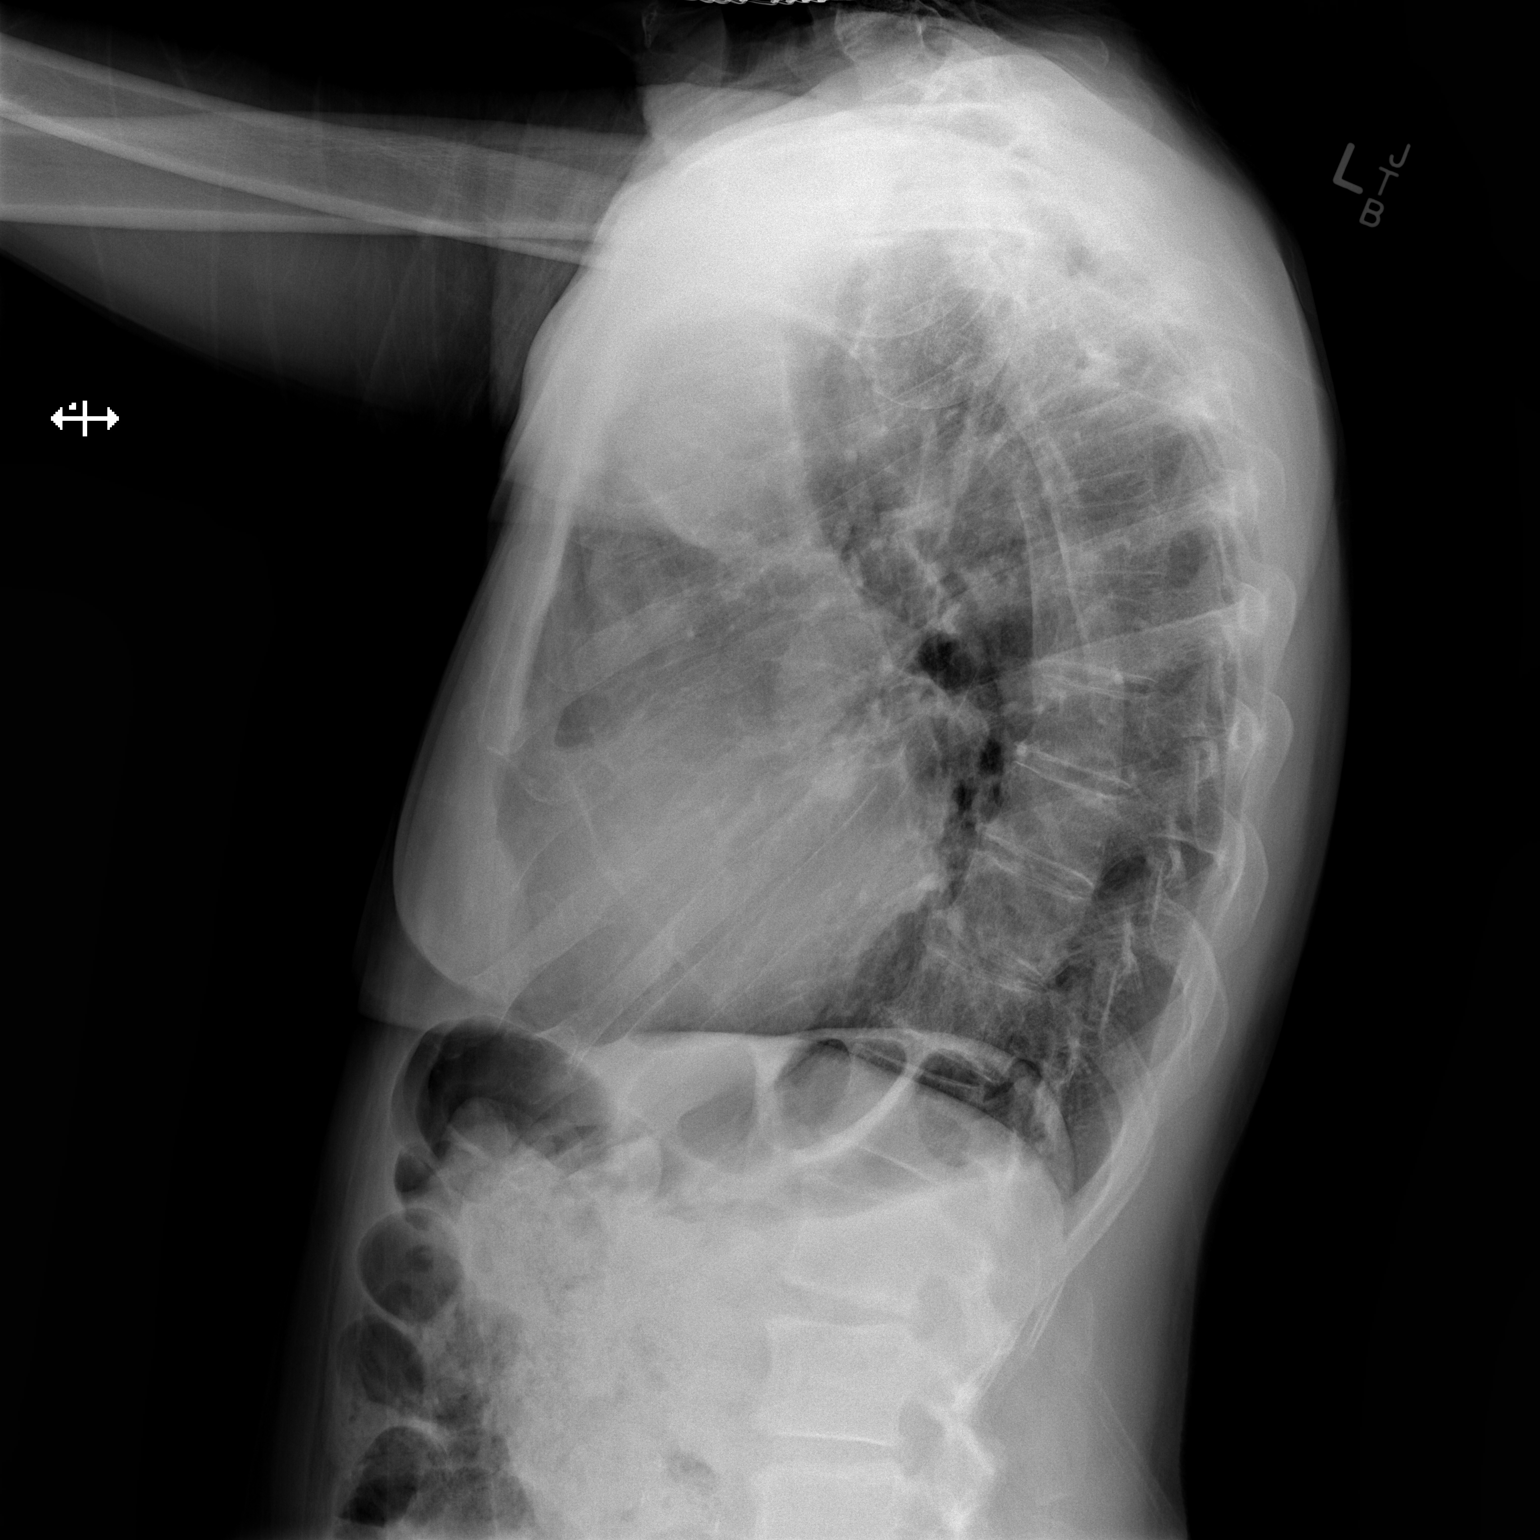

[2 of 2 positions shown; findings below may reference images not displayed]

FINDINGS: Cardiomegaly. Lungs clear. No effusions or edema. No acute bony
abnormality.
IMPRESSION: Cardiomegaly.  No active disease.

## 2019-06-22 ENCOUNTER — Ambulatory Visit: Payer: Medicare Other

## 2019-07-04 DIAGNOSIS — D631 Anemia in chronic kidney disease: Secondary | ICD-10-CM | POA: Diagnosis not present

## 2019-07-04 DIAGNOSIS — N189 Chronic kidney disease, unspecified: Secondary | ICD-10-CM | POA: Diagnosis not present

## 2019-07-04 DIAGNOSIS — N179 Acute kidney failure, unspecified: Secondary | ICD-10-CM | POA: Diagnosis not present

## 2019-07-04 DIAGNOSIS — N1831 Chronic kidney disease, stage 3a: Secondary | ICD-10-CM | POA: Diagnosis not present

## 2019-07-04 DIAGNOSIS — D472 Monoclonal gammopathy: Secondary | ICD-10-CM | POA: Diagnosis not present

## 2019-07-04 DIAGNOSIS — I429 Cardiomyopathy, unspecified: Secondary | ICD-10-CM | POA: Diagnosis not present

## 2019-07-04 DIAGNOSIS — I129 Hypertensive chronic kidney disease with stage 1 through stage 4 chronic kidney disease, or unspecified chronic kidney disease: Secondary | ICD-10-CM | POA: Diagnosis not present

## 2019-07-04 DIAGNOSIS — E611 Iron deficiency: Secondary | ICD-10-CM | POA: Diagnosis not present

## 2019-07-21 ENCOUNTER — Telehealth: Payer: Self-pay

## 2019-07-21 NOTE — Telephone Encounter (Signed)
   [facility] Pre-operative Risk Assessment    HEARTCARE STAFF: - Please ensure there is not already an duplicate clearance open for this procedure - Under Visit Info/Reason for Call, type in Other and utilize the format Clearance MM/DD/YY or Clearance TBD  Request for surgical clearance:  1. What type of surgery is being performed? Teeth extractions  2. When is this surgery scheduled? tbd  3. What type of clearance is required (medical clearance vs. Pharmacy clearance to hold med vs. Both)? Both, abx  4. Are there any medications that need to be held prior to surgery and how long? unknown  5. Practice name and name of physician performing surgery? [facility]-- Dr. Corene Cornea [facility] DDS  6. What is the office phone number? [phone]   7.   What is the office fax number? [phone]  8.   Anesthesia type (None, local, MAC, general) ? Nitrous oxide and LA   Darlyn Chamber Thaily Hackworth 07/21/2019, 11:47 AM  _________________________________________________________________   (provider comments below)

## 2019-07-24 NOTE — Telephone Encounter (Signed)
I s/w the dental office and confirmed pt will have 10 teeth extracted. Dr. Roney Jaffe, DDS would like to know if pt will SBE prior to procedure. Dental office gave their recommendations of Amoxicillin 500 mg tab; take 4 tabs 30-60 minutes prior to dental procedure. I will send update to pre op provider for further assessment.

## 2019-07-24 NOTE — Telephone Encounter (Signed)
How many teeth extraction?

## 2019-07-26 NOTE — Telephone Encounter (Signed)
   Primary Cardiologist: Dr. Percival Spanish / Dr. Aundra Dubin  Chart reviewed as part of pre-operative protocol coverage. Because of Luis Chen's past medical history and time since last visit, he/she will require a follow-up visit in order to better assess preoperative cardiovascular risk.  Pre-op covering staff: - Please schedule appointment and call patient to inform them. - Please contact requesting surgeon's office via preferred method (i.e, phone, fax) to inform them of need for appointment prior to surgery.  If applicable, this message will also be routed to pharmacy pool and/or primary cardiologist for input on holding anticoagulant/antiplatelet agent as requested below so that this information is available at time of patient's appointment.   Ledora Bottcher, PA  07/26/2019, 5:31 PM

## 2019-07-26 NOTE — Telephone Encounter (Signed)
Hildred Alamin, Clearance Coordinator for Dr. Land[facility] Corpor[facility] called and wanted to check on the status of the patient's surgical clearance.

## 2019-07-26 NOTE — Telephone Encounter (Signed)
Pt has been advised he needs pre op appt. Pt has been scheduled to see Almyra Deforest, PA 07/27/19 @ 8:45. Pt thanked me for the call and the help. I will forward clearance notes to PA for appt.

## 2019-07-27 ENCOUNTER — Other Ambulatory Visit: Payer: Self-pay

## 2019-07-27 ENCOUNTER — Encounter: Payer: Self-pay | Admitting: Physician Assistant

## 2019-07-27 ENCOUNTER — Ambulatory Visit (INDEPENDENT_AMBULATORY_CARE_PROVIDER_SITE_OTHER): Payer: Medicare Other | Admitting: Physician Assistant

## 2019-07-27 VITALS — BP 120/72 | HR 63 | Ht 70.0 in | Wt 153.2 lb

## 2019-07-27 DIAGNOSIS — I10 Essential (primary) hypertension: Secondary | ICD-10-CM

## 2019-07-27 DIAGNOSIS — N183 Chronic kidney disease, stage 3 unspecified: Secondary | ICD-10-CM | POA: Diagnosis not present

## 2019-07-27 DIAGNOSIS — Z01810 Encounter for preprocedural cardiovascular examination: Secondary | ICD-10-CM

## 2019-07-27 DIAGNOSIS — E785 Hyperlipidemia, unspecified: Secondary | ICD-10-CM

## 2019-07-27 DIAGNOSIS — I25810 Atherosclerosis of coronary artery bypass graft(s) without angina pectoris: Secondary | ICD-10-CM | POA: Diagnosis not present

## 2019-07-27 NOTE — Progress Notes (Signed)
Cardiology Office Note:    Date:  07/29/2019   ID:  Luis Chen, DOB [id number], MRN [id number]  PCP:  Leamon Arnt, MD  Cardiologist:  [name], MD  Electrophysiologist:  None   Referring MD: Leamon Arnt, MD   Chief Complaint  Patient presents with  . Pre-op Exam    upcoming dental procedure    History of Present Illness:    Luis Chen is a 79 y.o. male with a hx of CAD s/p CABG, HTN, HLD, CKD stage III, MGUS, mixed ischemic and NICM.  Previous echocardiogram in 2010 showed EF 25%.  Cardiac catheterization however showed nonobstructive CAD.  Ejection fraction improved to 50% by 2011.  However, repeat echocardiogram in December 2019 showed EF back down to 20 to 25%.  Left and right heart cath performed on 02/08/2018 showed severe multivessel CAD.  Cardiac MRI in December 2019 showed EF 22%, preserved RV function with EF of 42%, minimal LGE. He subsequently underwent CABG with SVG-LAD (LIMA inadequate as a graft) and sequential SVG-OM1/OM2.  He has a history of angioedema on he was on Entresto in the past, this was discontinued due to as he had history of angioedema with ACE inhibitor.  He was placed back on losartan 50 mg daily which was a medication he has taken in the past without any issue.  He is on spironolactone, losartan and carvedilol.  Last echocardiogram performed on 01/18/2019 showed EF 50 to 55%, grade 1 DD, mildly reduced RV systolic function, mildly dilated aortic root measuring 37 mm, otherwise no significant valve issue.   Patient presents today for cardiology office visit.  He has not had any angioedema-like symptoms on the losartan.  He is doing well on the current therapy.  He denies any recent chest pain, lower extremity edema, orthopnea or PND.  Overall, he is doing quite well from cardiac perspective and can follow up with Dr. Percival Spanish in 6 months.  He has upcoming dental extractions and 10 teeth need to be extracted at once.  This is a low risk  procedure, he may proceed without further work-up.  I would prefer him to stay on the aspirin, however he is currently on 325 mg aspirin, I will try to reduce this to 81 mg aspirin instead.  He says his dentist has already recommended antibiotic prior to the procedure, however this patient does not have any history of valve surgery therefore does not necessarily need SBE prophylaxis based on our current guidelines.  I forwarded a note to our clinical pharmacist who also agreed with me that this patient does not need SBE prophylaxis.  Past Medical History:  Diagnosis Date  . Anemia, chronic disease 07/01/2015  . Angina decubitus ([place])   . Benign essential hypertension 10/31/2008   Qualifier: Diagnosis of  By: Percival Spanish, MD, Farrel Gordon    . CAD (coronary artery disease)    non obstructive. Left main normal. LAd proximal long 25% stenosis, termingating as focal 50% prox to mid lesion. First & second diag were small, normal. Circumflex in proximal av groove had luminal irregularities.. Was large mid obtuse marg which was branching, scattered luminal irregul. Infer branch did supply some septal perforators. Was PDA,small & normal.  < 1.69mm. There was prox 95% stenosis  . CARDIOMYOPATHY 11/27/2008  . CKD (chronic kidney disease)   . Hyperlipidemia    transient history, controlled on diet  . Hypertension   . Mixed hyperlipidemia 10/30/2008   Qualifier: Diagnosis of  By: Lovette Cliche, CNA,  [name]    . Non-traumatic rhabdomyolysis 07/03/2015  . Polyarthropathy 07/01/2015  . Primary osteoarthritis of knees, bilateral 12/10/2014  . Pyogenic arthritis of left knee joint ([place])   . Swelling of left knee joint 07/01/2015  . Systolic HF (heart failure) ([place]) 02/08/2018    Past Surgical History:  Procedure Laterality Date  . CORONARY ARTERY BYPASS GRAFT N/A 02/17/2018   Procedure: CORONARY ARTERY BYPASS GRAFTING (CABG);  Surgeon: [name], MD;  Location: [facility];  Service: Open Heart Surgery;  Laterality: N/A;   Times 3 using endoscopically harvested right saphenous vein.    Marland Kitchen KNEE ARTHROSCOPY Left 07/01/2015   Procedure: Irrigation and debridement left knee arthroscopy;  Surgeon: Renette Butters, MD;  Location: [facility];  Service: Orthopedics;  Laterality: Left;  . RIGHT/LEFT HEART CATH AND CORONARY ANGIOGRAPHY N/A 02/08/2018   Procedure: RIGHT/LEFT HEART CATH AND CORONARY ANGIOGRAPHY;  Surgeon: Jettie Booze, MD;  Location: [facility];  Service: Cardiovascular;  Laterality: N/A;  . TEE WITHOUT CARDIOVERSION N/A 02/17/2018   Procedure: TRANSESOPHAGEAL ECHOCARDIOGRAM (TEE);  Surgeon: [name], MD;  Location: [facility];  Service: Open Heart Surgery;  Laterality: N/A;    Current Medications: Current Meds  Medication Sig  . carvedilol (COREG) 6.25 MG tablet Take 1 tablet (6.25 mg total) by mouth 2 (two) times daily with a meal.  . ezetimibe (ZETIA) 10 MG tablet Take 1 tablet (10 mg total) by mouth daily.  . feeding supplement, ENSURE ENLIVE, (ENSURE ENLIVE) LIQD Take 237 mLs by mouth 3 (three) times daily between meals.  . Ferrous Gluconate 324 (37.5 Fe) MG TABS TAKE 1 TABLET BY MOUTH TWICE DAILY WITH MEALS  . latanoprost (XALATAN) 0.005 % ophthalmic solution Place 1 drop into both eyes at bedtime.   Marland Kitchen losartan (COZAAR) 50 MG tablet Take 1 tablet (50 mg total) by mouth daily.  . rosuvastatin (CRESTOR) 40 MG tablet TAKE 1 TABLET BY MOUTH ONCE DAILY AT  6PM  . traZODone (DESYREL) 150 MG tablet Take 150 mg by mouth at bedtime as needed for sleep.   . [DISCONTINUED] aspirin EC 325 MG EC tablet Take 1 tablet (325 mg total) by mouth daily.     Allergies:   Ace inhibitors and Entresto [sacubitril-valsartan]   Social History   Socioeconomic History  . Marital status: Widowed    Spouse name: Not on file  . Number of children: Not on file  . Years of education: Not on file  . Highest education level: Not on file  Occupational History  . Not on file  Tobacco Use  . Smoking status: Never  Smoker  . Smokeless tobacco: Never Used  Substance and Sexual Activity  . Alcohol use: Yes    Comment: one glass of wine or beer occassionaly  . Drug use: No  . Sexual activity: Yes  Other Topics Concern  . Not on file  Social History Narrative   Lives with grandson (78 yo), wife passed on 06/01/2000. 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    Enjoys bowling- Is in a bowling league    02/11/2018 @ 1352 Pt discharged home with niece. No s/s of distress noted. Education completed, discharge instructions provided and understanding verbalized. Iv sites removed *2. Occlusive drsg per procedure-cdi. No c/o pain/nor distress.   Social Determinants of Health   Financial Resource Strain:   . Difficulty of Paying Living Expenses:   Food Insecurity:   . Worried About  Running Out of Food in the Last Year:   . [place] in the Last Year:   Transportation Needs:   . Lack of Transportation (Medical):   Marland Kitchen Lack of Transportation (Non-Medical):   Physical Activity:   . Days of Exercise per Week:   . Minutes of Exercise per Session:   Stress:   . Feeling of Stress :   Social Connections:   . Frequency of Communication with Friends and Family:   . Frequency of Social Gatherings with Friends and Family:   . Attends Religious Services:   . Active Member of Clubs or Organizations:   . Attends Archivist Meetings:   Marland Kitchen Marital Status:      Family History: The patient's family history includes Arthritis in his mother; Diabetes in his mother; Heart attack (age of onset: 53) in his mother; Heart disease in his brother and mother; Hyperlipidemia in his mother; Hypertension in his mother; Hypothyroidism in his daughter; Stroke in his daughter.  ROS:   Please see the history of present illness.     All other systems reviewed and are negative.  EKGs/Labs/Other Studies Reviewed:    The following studies were reviewed today:  Echo  01/18/2019 1. Left ventricular ejection fraction, by visual estimation, is 50 to  55%. The left ventricle has low normal function. There is mildly increased  left ventricular hypertrophy. Basal inferior hypokinesis. GLS -12.7%,  abnormal.  2. Left ventricular diastolic parameters are consistent with Grade I  diastolic dysfunction (impaired relaxation).  3. Global right ventricle has mildly reduced systolic function.The right  ventricular size is normal. No increase in right ventricular wall  thickness.  4. Left atrial size was normal.  5. Right atrial size was normal.  6. The mitral valve is normal in structure. Trace mitral valve  regurgitation. No evidence of mitral stenosis.  7. The tricuspid valve is normal in structure. Tricuspid valve  regurgitation is trivial.  8. The aortic valve is tricuspid. Aortic valve regurgitation is trivial.  Mild aortic valve sclerosis without stenosis.  9. There is mild dilatation of the aortic root measuring 37 mm.  10. The inferior vena cava is normal in size with greater than 50%  respiratory variability, suggesting right atrial pressure of 3 mmHg.  11. The tricuspid regurgitant velocity is 2.03 m/s, and with an assumed  right atrial pressure of 3 mmHg, the estimated right ventricular systolic  pressure is normal at 19.4 mmHg.   EKG:  EKG is ordered today.  The ekg ordered today demonstrates normal sinus rhythm without significant ST-T wave changes.  Recent Labs: 05/05/2019: ALT 21; BUN 25; Creatinine, Ser 1.55; Hemoglobin 10.2; Platelets 192.0; Potassium 4.6; Sodium 140; TSH 2.02  Recent Lipid Panel    Component Value Date/Time   CHOL 129 05/05/2019 0849   TRIG 59.0 05/05/2019 0849   HDL 41.60 05/05/2019 0849   CHOLHDL 3 05/05/2019 0849   VLDL 11.8 05/05/2019 0849   LDLCALC 75 05/05/2019 0849    Physical Exam:    VS:  BP 120/72   Pulse 63   Ht 5\' 10"  (1.778 m)   Wt 153 lb 3.2 oz (69.5 kg)   SpO2 94%   BMI 21.98 kg/m     Wt  Readings from Last 3 Encounters:  07/27/19 153 lb 3.2 oz (69.5 kg)  05/05/19 150 lb 9.6 oz (68.3 kg)  01/18/19 151 lb (68.5 kg)     GEN:  Well nourished, well developed in no acute distress HEENT:  Normal NECK: No JVD; No carotid bruits LYMPHATICS: No lymphadenopathy CARDIAC: RRR, no murmurs, rubs, gallops RESPIRATORY:  Clear to auscultation without rales, wheezing or rhonchi  ABDOMEN: Soft, non-tender, non-distended MUSCULOSKELETAL:  No edema; No deformity  SKIN: Warm and dry NEUROLOGIC:  Alert and oriented x 3 PSYCHIATRIC:  Normal affect   ASSESSMENT:    1. Pre-procedural cardiovascular examination   2. Coronary artery disease involving coronary bypass graft of native heart without angina pectoris   3. Essential hypertension   4. Hyperlipidemia LDL goal <70   5. Stage 3 chronic kidney disease, unspecified whether stage 3a or 3b CKD    PLAN:    In order of problems listed above:  1. Preop clearance: Patient has upcoming dental extraction.  He is cleared to proceed with the low risks procedure.  I have checked with our clinical pharmacist, since he does not have any prior history of valve procedure, no SBE prophylaxis is needed.  2. CAD s/p CABG: I recommend reduce his 325 mg aspirin to 81 mg aspirin.  He denies any recent chest pain   3. Hypertension: Blood pressure stable  4. Hyperlipidemia: Continue Crestor and Zetia  5. CKD stage III: Stable on last lab work.   Medication Adjustments/Labs and Tests Ordered: Current medicines are reviewed at length with the patient today.  Concerns regarding medicines are outlined above.  Orders Placed This Encounter  Procedures  . EKG 12-Lead   No orders of the defined types were placed in this encounter.   Patient Instructions  Medication Instructions:  Your physician recommends that you continue on your current medications as directed. Please refer to the Current Medication list given to you today.  *If you need a refill on  your cardiac medications before your next appointment, please call your pharmacy*  Lab Work: NONE ordered at this time of appointment   If you have labs (blood work) drawn today and your tests are completely normal, you will receive your results only by: Marland Kitchen MyChart Message (if you have MyChart) OR . A paper copy in the mail If you have any lab test that is abnormal or we need to change your treatment, we will call you to review the results.  Testing/Procedures: NONE ordered at this time of appointment   Follow-Up: At [facility], you and your health needs are our priority.  As part of our continuing mission to provide you with exceptional heart care, we have created designated Provider Care Teams.  These Care Teams include your primary Cardiologist (physician) and Advanced Practice Providers (APPs -  Physician Assistants and Nurse Practitioners) who all work together to provide you with the care you need, when you need it.  Your next appointment:   6 month(s)  The format for your next appointment:   In Person  Provider:   [name], MD  Other Instructions      Signed, Almyra Deforest, [place]  07/29/2019 9:41 PM    [place]

## 2019-07-27 NOTE — Patient Instructions (Signed)
Medication Instructions:  ?Your physician recommends that you continue on your current medications as directed. Please refer to the Current Medication list given to you today. ? ?*If you need a refill on your cardiac medications before your next appointment, please call your pharmacy* ? ?Lab Work: ?NONE ordered at this time of appointment  ? ?If you have labs (blood work) drawn today and your tests are completely normal, you will receive your results only by: ?MyChart Message (if you have MyChart) OR ?A paper copy in the mail ?If you have any lab test that is abnormal or we need to change your treatment, we will call you to review the results. ? ?Testing/Procedures: ?NONE ordered at this time of appointment  ? ?Follow-Up: ?At [facility], you and your health needs are our priority.  As part of our continuing mission to provide you with exceptional heart care, we have created designated Provider Care Teams.  These Care Teams include your primary Cardiologist (physician) and Advanced Practice Providers (APPs -  Physician Assistants and Nurse Practitioners) who all work together to provide you with the care you need, when you need it. ? ?Your next appointment:   ?6 month(s) ? ?The format for your next appointment:   ?In Person ? ?Provider:   ?James Hochrein, MD   ? ?Other Instructions ? ? ?

## 2019-07-27 NOTE — Telephone Encounter (Signed)
Agree that pt will NOT need antibiotic prophylaxis prior to dental extractions.  Would clarify what dose of aspirin pt is currently taking as he should not require full dose aspirin anymore since his CABG was in 2019.

## 2019-07-27 NOTE — Telephone Encounter (Signed)
   Primary Cardiologist: [name], MD  Chart reviewed as part of pre-operative protocol coverage. Given past medical history and time since last visit, based on ACC/AHA guidelines, Luis Chen would be at acceptable risk for the planned procedure without further cardiovascular testing.   I will route this recommendation to the requesting party via Epic fax function and remove from pre-op pool. Patient was seen in the cardiology office on 5/20 at which time he was doing well. He may proceed with dental extraction. I would prefer him to stay on the aspirin through the procedure if at all possible.   Please call with questions.  I will forward this note to our clinical pharmacist to review, although previous note mentioned antibiotic prophylaxis, however this patient does not have prior history of valve procedure. He did have CABG in the past. I do not think he necessarily needs SBE prophylaxis, however will ask our clinical pharmacist to review.   [place], [place] 07/27/2019, 9:15 AM

## 2019-07-28 ENCOUNTER — Other Ambulatory Visit: Payer: Self-pay | Admitting: Physician Assistant

## 2019-07-28 ENCOUNTER — Encounter: Payer: Self-pay | Admitting: Physician Assistant

## 2019-07-28 MED ORDER — ASPIRIN 81 MG PO TBEC: 81.0000 mg | DELAYED_RELEASE_TABLET | Freq: Every day | ORAL | Status: AC

## 2019-07-28 NOTE — Telephone Encounter (Signed)
I have called back and spoke with the patient

## 2019-07-28 NOTE — Telephone Encounter (Signed)
error 

## 2019-07-28 NOTE — Telephone Encounter (Signed)
Follow up     Pt is calling and says he got a call yesterday about taking antibiotics before a procedure. He is wondering if he is suppose to have the antibiotics    Please advise

## 2019-07-29 ENCOUNTER — Encounter: Payer: Self-pay | Admitting: Physician Assistant

## 2019-10-04 ENCOUNTER — Telehealth: Payer: Self-pay | Admitting: Family Medicine

## 2019-10-04 NOTE — Progress Notes (Signed)
  Chronic Care Management   Note  10/04/2019 Name: Luis Chen MRN: [id number] DOB: [birth year]  Luis Chen is a 79 y.o. year old male who is a primary care patient of Leamon Arnt, MD. I reached out to [employer] by phone today in response to a referral sent by Luis Chen's PCP, Leamon Arnt, MD.   Luis Chen was given information about Chronic Care Management services today including:  1. CCM service includes personalized support from designated clinical staff supervised by his physician, including individualized plan of care and coordination with other care providers 2. 24/7 contact phone numbers for assistance for urgent and routine care needs. 3. Service will only be billed when office clinical staff spend 20 minutes or more in a month to coordinate care. 4. Only one practitioner may furnish and bill the service in a calendar month. 5. The patient may stop CCM services at any time (effective at the end of the month) by phone call to the office staff.   Patient agreed to services and verbal consent obtained.   Follow up plan:   Earney [place] Upstream Scheduler

## 2019-10-27 NOTE — Progress Notes (Signed)
Chronic Care Management Pharmacy  Name: Luis Chen  MRN: [id number] DOB: [birth year]  Chief Complaint/ HPI  Luis Chen,  79 y.o., male presents for their Initial CCM visit with the clinical pharmacist via telephone due to COVID-19 Pandemic.  Pt staying actively involved in community through church and Therapist, sports. Wanting to get more active and get back to [facility]. Inquiring on fitness benefit with current plan.  PCP : Leamon Arnt, MD  Chronic conditions include:  Encounter Diagnoses  Name Primary?  . Mixed hyperlipidemia Yes  . Coronary artery disease involving native coronary artery of native heart with other form of angina pectoris ([place])   . Chronic systolic heart failure ([place])   . Stage 3 chronic kidney disease, unspecified whether stage 3a or 3b CKD     Office Visits:  05/05/2019 (PCP): annual visit, needing tdap.  Patient Active Problem List   Diagnosis Date Noted  . S/P CABG x 3 02/17/2018  . CAD (coronary artery disease) 02/08/2018  . Systolic HF (heart failure) ([place]) 02/08/2018  . Nonischemic cardiomyopathy ([place]) 07/03/2015  . Anemia, chronic disease 07/01/2015  . Primary osteoarthritis of knees, bilateral 12/10/2014  . MGUS (monoclonal gammopathy of unknown significance) 06/30/2010  . Chronic kidney disease (CKD), stage III (moderate) 03/24/2010  . Benign essential hypertension 10/31/2008  . Mixed hyperlipidemia 10/30/2008   Past Surgical History:  Procedure Laterality Date  . CORONARY ARTERY BYPASS GRAFT N/A 02/17/2018   Procedure: CORONARY ARTERY BYPASS GRAFTING (CABG);  Surgeon: [name], MD;  Location: [place];  Service: Open Heart Surgery;  Laterality: N/A;  Times 3 using endoscopically harvested right saphenous vein.    Marland Kitchen KNEE ARTHROSCOPY Left 07/01/2015   Procedure: Irrigation and debridement left knee arthroscopy;  Surgeon: Renette Butters, MD;  Location: [address];  Service: Orthopedics;  Laterality: Left;  . RIGHT/LEFT HEART CATH  AND CORONARY ANGIOGRAPHY N/A 02/08/2018   Procedure: RIGHT/LEFT HEART CATH AND CORONARY ANGIOGRAPHY;  Surgeon: Jettie Booze, MD;  Location: [place] [facility];  Service: Cardiovascular;  Laterality: N/A;  . TEE WITHOUT CARDIOVERSION N/A 02/17/2018   Procedure: TRANSESOPHAGEAL ECHOCARDIOGRAM (TEE);  Surgeon: [name], MD;  Location: [place];  Service: Open Heart Surgery;  Laterality: N/A;   Family History  Problem Relation Age of Onset  . Diabetes Mother   . Arthritis Mother   . Heart disease Mother   . Hyperlipidemia Mother   . Hypertension Mother   . Heart attack Mother 50  . Heart disease Brother        had in his 63s. Had CABG x2  . Hypothyroidism Daughter   . Stroke Daughter    Allergies  Allergen Reactions  . Ace Inhibitors Swelling    Facial swelling   . Entresto [Sacubitril-Valsartan] Swelling    Angioedema with ACE Inhibitor   Outpatient Encounter Medications as of 10/31/2019  Medication Sig  . aspirin 81 MG EC tablet Take 1 tablet (81 mg total) by mouth daily.  . carvedilol (COREG) 6.25 MG tablet Take 1 tablet (6.25 mg total) by mouth 2 (two) times daily with a meal.  . ezetimibe (ZETIA) 10 MG tablet Take 1 tablet (10 mg total) by mouth daily.  . feeding supplement, ENSURE ENLIVE, (ENSURE ENLIVE) LIQD Take 237 mLs by mouth 3 (three) times daily between meals.  . Ferrous Gluconate 324 (37.5 Fe) MG TABS TAKE 1 TABLET BY MOUTH TWICE DAILY WITH MEALS  . furosemide (LASIX) 20 MG tablet Take 20 mg by  mouth daily.  Marland Kitchen latanoprost (XALATAN) 0.005 % ophthalmic solution Place 1 drop into both eyes at bedtime.   Marland Kitchen losartan (COZAAR) 50 MG tablet Take 1 tablet (50 mg total) by mouth daily. (Patient taking differently: Take 50 mg by mouth in the morning and at bedtime. )  . rosuvastatin (CRESTOR) 40 MG tablet TAKE 1 TABLET BY MOUTH ONCE DAILY AT  6PM  . traZODone (DESYREL) 150 MG tablet Take 150 mg by mouth at bedtime as needed for sleep.   Marland Kitchen spironolactone (ALDACTONE) 25 MG  tablet Take 1 tablet (25 mg total) by mouth daily.   No facility-administered encounter medications on file as of 10/31/2019.   Patient Care Team    Relationship Specialty Notifications Start End  Leamon Arnt, MD PCP - General Family Medicine  07/01/15   Larey Dresser, MD PCP - Advanced Heart Failure Cardiology  12/05/18   [name], MD PCP - Cardiology Cardiology Admissions 07/26/19   Renette Butters, MD Attending Physician Orthopedic Surgery  02/23/17   [name], MD Consulting Physician Nephrology  02/23/17   Caryl Pina, MD  Hematology  02/23/17   [name], MD Consulting Physician Cardiology  02/23/17   [facility]    08/25/17   Wyatt Portela, MD Consulting Physician Oncology  01/16/19   Clinic, [facility] Consulting Physician   01/16/19   Madelin Rear, [facility] Pharmacist Pharmacist  10/04/19    Comment: PHONE NUMBER [phone]   Current Diagnosis/Assessment: Goals Addressed            This Visit's Progress   . PharmD Care Plan       CARE PLAN ENTRY (see longitudinal plan of care for additional care plan information)  Current Barriers:  . Chronic Disease Management support, education, and care coordination needs related to Hypertension and Hyperlipidemia   Hypertension / Heart Failure BP Readings from Last 3 Encounters:  07/27/19 120/72  05/05/19 110/62  01/18/19 (!) 103/58   . Pharmacist Clinical Goal(s): o Over the next 180 days, patient will work with PharmD and providers to maintain BP goal <130/80 . Current regimen:  o Losartan 50 mg twice daily o Spironolactone 25 mg once daily o Carvedilol 6.25 mg twice daily . Interventions: o Diet/exercise recommendations - Getting back to the [facility] . Patient self care activities - Over the next 180 days, patient will: o Check BP at elast once every 1-2 weeks, document, and provide at future appointments o Ensure daily salt intake < 2300 mg/day  Hyperlipidemia Lab Results  Component  Value Date/Time   LDLCALC 75 05/05/2019 08:49 AM   . Pharmacist Clinical Goal(s): o Over the next 180 days, patient will work with PharmD and providers to achieve LDL goal < 70 . Current regimen:  o Rosuvastatin 40 mg once daily o Zetia 10 mg once daily . Interventions: o Continue current management . Patient self care activities - Over the next 180 days, patient will: o Continue current management Medication management . Pharmacist Clinical Goal(s): o Over the next 180 days, patient will work with PharmD and providers to achieve optimal medication adherence . Current pharmacy: [place] . Interventions o Comprehensive medication review performed. o Continue current medication management strategy . Patient self care activities - Over the next 180 days, patient will: o Take medications as prescribed o Report any questions or concerns to PharmD and/or provider(s) Initial goal documentation.      Hypertension   BP goal <130/80  BP Readings from Last 3  Encounters:  07/27/19 120/72  05/05/19 110/62  01/18/19 (!) 103/58   Patient checks BP at home 1-2x per week. Patient home BP readings are ranging: 119/70, 122/70. Patient is currently at goal.  We discussed diet and exercise extensively.  Plan  Continue current medications and control with diet and exercise.   Heart Failure   Type: Systolic. Last ejection fraction: 2020. 50 to 55%. Patient has failed these meds in past: Entresto (allergy) Patient is currently controlled on the following medications:  . Spironolactone 25 mg once daily  . Losartan 50 mg twice daily . Carvedilol 6.25 mg twice daily  We discussed diet and exercise extensively.  Plan  Continue current medications.  Hyperlipidemia   LDL goal < 70  Lipid Panel     Component Value Date/Time   CHOL 129 05/05/2019 0849   TRIG 59.0 05/05/2019 0849   HDL 41.60 05/05/2019 0849   LDLCALC 75 05/05/2019 0849    Hepatic Function Latest Ref Rng &  Units 05/05/2019 12/30/2018 09/14/2018  Total Protein 6.0 - 8.3 g/dL 6.8 7.0 -  Albumin 3.5 - 5.2 g/dL 3.6 3.4(L) -  AST 0 - 37 U/L _0 ALT 0 - 53 U/L _1 Alk Phosphatase 39 - 117 U/L 66 68 -  Total Bilirubin 0.2 - 1.2 mg/dL 0.4 0.4 -    The ASCVD Risk score Mikey Bussing DC Jr., et al., 2013) failed to calculate for the following reasons:   The 2013 ASCVD risk score is only valid for ages 37 to 74   Patient is currently above goal on the following medications:  . Rosuvastatin 40 mg once daily . Zetia 10 mg once daily   We discussed:  diet and exercise extensively.  Plan  Continue current medications.  Vaccines   Immunization History  Administered Date(s) Administered  . Fluad Quad(high Dose 65+) 10/31/2018  . Influenza Split 11/03/2010  . Influenza, High Dose Seasonal PF 11/30/2011, 12/06/2013, 12/10/2014, 12/20/2015, 02/10/2018  . Influenza,inj,Quad PF,6+ Mos 12/05/2012  . Influenza-Unspecified 01/31/2017  . Moderna SARS-COVID-2 Vaccination 04/20/2019  . Pneumococcal Conjugate-13 12/06/2013  . Pneumococcal Polysaccharide-23 03/09/2008  . Tdap 12/03/2008  . Zoster 03/09/2008   Received 2nd moderna dose March 11th. Reviewed and discussed patient's vaccination history. Due for Shingrix, Tdap.  Plan  Recommended patient receive Shingrix and Tdap vaccine in pharmacy or [place].   Medication Management / Care Coordination   Receives prescription medications from: [place] at no cost.  [place], [place] - [address] [place] [address] [place] [address] Phone: [phone] Fax: [phone]   Denies any issues with current medication management.   Reviewed fitness benefit through [facility] PPO - as of 10/31/2019, current rate is $29 initial plus $29/month.  Plan  Continue current medication management strategy.  _________________________ SDOH (Social Determinants of Health) assessments performed: Yes.  Future  Appointments  Date Time Provider [place]  12/29/2019  9:30 AM [facility] None  12/29/2019 10:00 AM Shadad, Mathis Dad, MD [facility] None   Visit follow-up:  . CPA follow-up: November 2021 BP call. Marland Kitchen RPH follow-up: 6 month telephone visit.  Madelin Rear, Pharm.D., BCGP Clinical Pharmacist [facility] [phone]

## 2019-10-31 ENCOUNTER — Ambulatory Visit: Payer: Medicare Other

## 2019-10-31 DIAGNOSIS — I25118 Atherosclerotic heart disease of native coronary artery with other forms of angina pectoris: Secondary | ICD-10-CM

## 2019-10-31 DIAGNOSIS — I5022 Chronic systolic (congestive) heart failure: Secondary | ICD-10-CM

## 2019-10-31 DIAGNOSIS — N183 Chronic kidney disease, stage 3 unspecified: Secondary | ICD-10-CM

## 2019-10-31 DIAGNOSIS — E782 Mixed hyperlipidemia: Secondary | ICD-10-CM

## 2019-10-31 NOTE — Patient Instructions (Addendum)
Mr Luis Chen,  I reviewed the fitness benefit through [facility] PPO - as of 10/31/2019, current rate is $29 initial plus $29/month. You can call [phone] or go to [email or web address] for more info on this. I hope this helps in getting you back to the [facility]!  Also as a friendly reminder, please schedule your 57-month visit with Dr. Jonni Sanger.   Call me at [phone] (direct line) with any questions!  Thank you, Edyth Gunnels., Clinical Pharmacist  Goals Addressed            This Visit's Progress   . PharmD Care Plan       CARE PLAN ENTRY (see longitudinal plan of care for additional care plan information)  Current Barriers:  . Chronic Disease Management support, education, and care coordination needs related to Hypertension and Hyperlipidemia   Hypertension / Heart Failure BP Readings from Last 3 Encounters:  07/27/19 120/72  05/05/19 110/62  01/18/19 (!) 103/58   . Pharmacist Clinical Goal(s): o Over the next 180 days, patient will work with PharmD and providers to maintain BP goal <130/80 . Current regimen:  o Losartan 50 mg twice daily o Spironolactone 25 mg once daily o Carvedilol 6.25 mg twice daily . Interventions: o Diet/exercise recommendations - Getting back to the [facility] . Patient self care activities - Over the next 180 days, patient will: o Check BP at elast once every 1-2 weeks, document, and provide at future appointments o Ensure daily salt intake < 2300 mg/day  Hyperlipidemia Lab Results  Component Value Date/Time   LDLCALC 75 05/05/2019 08:49 AM   . Pharmacist Clinical Goal(s): o Over the next 180 days, patient will work with PharmD and providers to achieve LDL goal < 70 . Current regimen:  o Rosuvastatin 40 mg once daily o Zetia 10 mg once daily . Interventions: o Continue current management . Patient self care activities - Over the next 180 days, patient will: o Continue current management Medication management . Pharmacist Clinical Goal(s): o Over the next  180 days, patient will work with PharmD and providers to achieve optimal medication adherence . Current pharmacy: [place] . Interventions o Comprehensive medication review performed. o Continue current medication management strategy . Patient self care activities - Over the next 180 days, patient will: o Take medications as prescribed o Report any questions or concerns to PharmD and/or provider(s) Initial goal documentation.      Mr. Reali was given information about Chronic Care Management services today including:  1. CCM service includes personalized support from designated clinical staff supervised by his physician, including individualized plan of care and coordination with other care providers 2. 24/7 contact phone numbers for assistance for urgent and routine care needs. 3. Standard insurance, coinsurance, copays and deductibles apply for chronic care management only during months in which we provide at least 20 minutes of these services. Most insurances cover these services at 100%, however patients may be responsible for any copay, coinsurance and/or deductible if applicable. This service may help you avoid the need for more expensive face-to-face services. 4. Only one practitioner may furnish and bill the service in a calendar month. 5. The patient may stop CCM services at any time (effective at the end of the month) by phone call to the office staff.  Patient agreed to services and verbal consent obtained.   The patient verbalized understanding of instructions provided today and agreed to receive a mailed copy of patient instruction and/or educational materials. Telephone follow up appointment with  pharmacy team member scheduled for: See next appointment with "Care Management Staff" under "What's Next" below.   Madelin Rear, Pharm.D., BCGP Clinical Pharmacist [facility] [phone]  Hypertension, Adult High blood pressure (hypertension) is when the force of  blood pumping through the arteries is too strong. The arteries are the blood vessels that carry blood from the heart throughout the body. Hypertension forces the heart to work harder to pump blood and may cause arteries to become narrow or stiff. Untreated or uncontrolled hypertension can cause a heart attack, heart failure, a stroke, kidney disease, and other problems. A blood pressure reading consists of a higher number over a lower number. Ideally, your blood pressure should be below 120/80. The first ("top") number is called the systolic pressure. It is a measure of the pressure in your arteries as your heart beats. The second ("bottom") number is called the diastolic pressure. It is a measure of the pressure in your arteries as the heart relaxes. What are the causes? The exact cause of this condition is not known. There are some conditions that result in or are related to high blood pressure. What increases the risk? Some risk factors for high blood pressure are under your control. The following factors may make you more likely to develop this condition:  Smoking.  Having type 2 diabetes mellitus, high cholesterol, or both.  Not getting enough exercise or physical activity.  Being overweight.  Having too much fat, sugar, calories, or salt (sodium) in your diet.  Drinking too much alcohol. Some risk factors for high blood pressure may be difficult or impossible to change. Some of these factors include:  Having chronic kidney disease.  Having a family history of high blood pressure.  Age. Risk increases with age.  Race. You may be at higher risk if you are African American.  Gender. Men are at higher risk than women before age 109. After age 31, women are at higher risk than men.  Having obstructive sleep apnea.  Stress. What are the signs or symptoms? High blood pressure may not cause symptoms. Very high blood pressure (hypertensive crisis) may  cause:  Headache.  Anxiety.  Shortness of breath.  Nosebleed.  Nausea and vomiting.  Vision changes.  Severe chest pain.  Seizures. How is this diagnosed? This condition is diagnosed by measuring your blood pressure while you are seated, with your arm resting on a flat surface, your legs uncrossed, and your feet flat on the floor. The cuff of the blood pressure monitor will be placed directly against the skin of your upper arm at the level of your heart. It should be measured at least twice using the same arm. Certain conditions can cause a difference in blood pressure between your right and left arms. Certain factors can cause blood pressure readings to be lower or higher than normal for a short period of time:  When your blood pressure is higher when you are in a health care provider's office than when you are at home, this is called white coat hypertension. Most people with this condition do not need medicines.  When your blood pressure is higher at home than when you are in a health care provider's office, this is called masked hypertension. Most people with this condition may need medicines to control blood pressure. If you have a high blood pressure reading during one visit or you have normal blood pressure with other risk factors, you may be asked to:  Return on a different day  to have your blood pressure checked again.  Monitor your blood pressure at home for 1 week or longer. If you are diagnosed with hypertension, you may have other blood or imaging tests to help your health care provider understand your overall risk for other conditions. How is this treated? This condition is treated by making healthy lifestyle changes, such as eating healthy foods, exercising more, and reducing your alcohol intake. Your health care provider may prescribe medicine if lifestyle changes are not enough to get your blood pressure under control, and if:  Your systolic blood pressure is above  130.  Your diastolic blood pressure is above 80. Your personal target blood pressure may vary depending on your medical conditions, your age, and other factors. Follow these instructions at home: Eating and drinking   Eat a diet that is high in fiber and potassium, and low in sodium, added sugar, and fat. An example eating plan is called the DASH (Dietary Approaches to Stop Hypertension) diet. To eat this way: ? Eat plenty of fresh fruits and vegetables. Try to fill one half of your plate at each meal with fruits and vegetables. ? Eat whole grains, such as whole-wheat pasta, brown rice, or whole-grain bread. Fill about one fourth of your plate with whole grains. ? Eat or drink low-fat dairy products, such as skim milk or low-fat yogurt. ? Avoid fatty cuts of meat, processed or cured meats, and poultry with skin. Fill about one fourth of your plate with lean proteins, such as fish, chicken without skin, beans, eggs, or tofu. ? Avoid pre-made and processed foods. These tend to be higher in sodium, added sugar, and fat.  Reduce your daily sodium intake. Most people with hypertension should eat less than 1,500 mg of sodium a day.  Do not drink alcohol if: ? Your health care provider tells you not to drink. ? You are pregnant, may be pregnant, or are planning to become pregnant.  If you drink alcohol: ? Limit how much you use to:  0-1 drink a day for women.  0-2 drinks a day for men. ? Be aware of how much alcohol is in your drink. In the [place] one drink equals one 12 oz bottle of beer (355 mL), one 5 oz glass of wine (148 mL), or one 1 oz glass of hard liquor (44 mL). Lifestyle   Work with your health care provider to maintain a healthy body weight or to lose weight. Ask what an ideal weight is for you.  Get at least 30 minutes of exercise most days of the week. Activities may include walking, swimming, or biking.  Include exercise to strengthen your muscles (resistance exercise),  such as Pilates or lifting weights, as part of your weekly exercise routine. Try to do these types of exercises for 30 minutes at least 3 days a week.  Do not use any products that contain nicotine or tobacco, such as cigarettes, e-cigarettes, and chewing tobacco. If you need help quitting, ask your health care provider.  Monitor your blood pressure at home as told by your health care provider.  Keep all follow-up visits as told by your health care provider. This is important. Medicines  Take over-the-counter and prescription medicines only as told by your health care provider. Follow directions carefully. Blood pressure medicines must be taken as prescribed.  Do not skip doses of blood pressure medicine. Doing this puts you at risk for problems and can make the medicine less effective.  Ask your health care  provider about side effects or reactions to medicines that you should watch for. Contact a health care provider if you:  Think you are having a reaction to a medicine you are taking.  Have headaches that keep coming back (recurring).  Feel dizzy.  Have swelling in your ankles.  Have trouble with your vision. Get help right away if you:  Develop a severe headache or confusion.  Have unusual weakness or numbness.  Feel faint.  Have severe pain in your chest or abdomen.  Vomit repeatedly.  Have trouble breathing. Summary  Hypertension is when the force of blood pumping through your arteries is too strong. If this condition is not controlled, it may put you at risk for serious complications.  Your personal target blood pressure may vary depending on your medical conditions, your age, and other factors. For most people, a normal blood pressure is less than 120/80.  Hypertension is treated with lifestyle changes, medicines, or a combination of both. Lifestyle changes include losing weight, eating a healthy, low-sodium diet, exercising more, and limiting alcohol. This  information is not intended to replace advice given to you by your health care provider. Make sure you discuss any questions you have with your health care provider. Document Revised: 11/03/2017 Document Reviewed: 11/03/2017 Elsevier Patient Education  2020 [employer].

## 2019-12-27 DIAGNOSIS — E611 Iron deficiency: Secondary | ICD-10-CM | POA: Diagnosis not present

## 2019-12-27 DIAGNOSIS — D472 Monoclonal gammopathy: Secondary | ICD-10-CM | POA: Diagnosis not present

## 2019-12-27 DIAGNOSIS — N1831 Chronic kidney disease, stage 3a: Secondary | ICD-10-CM | POA: Diagnosis not present

## 2019-12-27 DIAGNOSIS — I129 Hypertensive chronic kidney disease with stage 1 through stage 4 chronic kidney disease, or unspecified chronic kidney disease: Secondary | ICD-10-CM | POA: Diagnosis not present

## 2019-12-27 DIAGNOSIS — D631 Anemia in chronic kidney disease: Secondary | ICD-10-CM | POA: Diagnosis not present

## 2019-12-27 DIAGNOSIS — N179 Acute kidney failure, unspecified: Secondary | ICD-10-CM | POA: Diagnosis not present

## 2019-12-27 DIAGNOSIS — I429 Cardiomyopathy, unspecified: Secondary | ICD-10-CM | POA: Diagnosis not present

## 2019-12-27 DIAGNOSIS — N189 Chronic kidney disease, unspecified: Secondary | ICD-10-CM | POA: Diagnosis not present

## 2019-12-29 ENCOUNTER — Other Ambulatory Visit: Payer: Self-pay

## 2019-12-29 ENCOUNTER — Inpatient Hospital Stay: Payer: Medicare Other | Attending: Oncology

## 2019-12-29 ENCOUNTER — Inpatient Hospital Stay (HOSPITAL_BASED_OUTPATIENT_CLINIC_OR_DEPARTMENT_OTHER): Payer: Medicare Other | Admitting: Oncology

## 2019-12-29 VITALS — BP 93/48 | HR 71 | Temp 97.9°F | Resp 18 | Ht 70.0 in | Wt 149.4 lb

## 2019-12-29 DIAGNOSIS — D509 Iron deficiency anemia, unspecified: Secondary | ICD-10-CM | POA: Diagnosis not present

## 2019-12-29 DIAGNOSIS — D631 Anemia in chronic kidney disease: Secondary | ICD-10-CM | POA: Insufficient documentation

## 2019-12-29 DIAGNOSIS — D472 Monoclonal gammopathy: Secondary | ICD-10-CM | POA: Diagnosis not present

## 2019-12-29 DIAGNOSIS — Z7982 Long term (current) use of aspirin: Secondary | ICD-10-CM | POA: Diagnosis not present

## 2019-12-29 DIAGNOSIS — N189 Chronic kidney disease, unspecified: Secondary | ICD-10-CM | POA: Diagnosis not present

## 2019-12-29 DIAGNOSIS — Z79899 Other long term (current) drug therapy: Secondary | ICD-10-CM | POA: Diagnosis not present

## 2019-12-29 DIAGNOSIS — D638 Anemia in other chronic diseases classified elsewhere: Secondary | ICD-10-CM

## 2019-12-29 DIAGNOSIS — I25810 Atherosclerosis of coronary artery bypass graft(s) without angina pectoris: Secondary | ICD-10-CM

## 2019-12-29 LAB — CMP (CANCER CENTER ONLY)
ALT: 17 U/L (ref 0–44)
AST: 20 U/L (ref 15–41)
Albumin: 3.6 g/dL (ref 3.5–5.0)
Alkaline Phosphatase: 89 U/L (ref 38–126)
Anion gap: 7 (ref 5–15)
BUN: 38 mg/dL — ABNORMAL HIGH (ref 8–23)
CO2: 24 mmol/L (ref 22–32)
Calcium: 9.7 mg/dL (ref 8.9–10.3)
Chloride: 108 mmol/L (ref 98–111)
Creatinine: 2.13 mg/dL — ABNORMAL HIGH (ref 0.61–1.24)
GFR, Estimated: 31 mL/min — ABNORMAL LOW (ref >60)
Glucose, Bld: 93 mg/dL (ref 70–99)
Potassium: 5 mmol/L (ref 3.5–5.1)
Sodium: 139 mmol/L (ref 135–145)
Total Bilirubin: 0.4 mg/dL (ref 0.3–1.2)
Total Protein: 7.3 g/dL (ref 6.5–8.1)

## 2019-12-29 LAB — IRON AND TIBC
Iron: 52 ug/dL (ref 42–163)
Saturation Ratios: 19 % — ABNORMAL LOW (ref 20–55)
TIBC: 274 ug/dL (ref 202–409)
UIBC: 221 ug/dL (ref 117–376)

## 2019-12-29 LAB — CBC WITH DIFFERENTIAL (CANCER CENTER ONLY)
Abs Immature Granulocytes: 0.01 10*3/uL (ref 0.00–0.07)
Basophils Absolute: 0 10*3/uL (ref 0.0–0.1)
Basophils Relative: 0 %
Eosinophils Absolute: 0.4 10*3/uL (ref 0.0–0.5)
Eosinophils Relative: 6 %
HCT: 31.5 % — ABNORMAL LOW (ref 39.0–52.0)
Hemoglobin: 10 g/dL — ABNORMAL LOW (ref 13.0–17.0)
Immature Granulocytes: 0 %
Lymphocytes Relative: 38 %
Lymphs Abs: 2.5 10*3/uL (ref 0.7–4.0)
MCH: 30.6 pg (ref 26.0–34.0)
MCHC: 31.7 g/dL (ref 30.0–36.0)
MCV: 96.3 fL (ref 80.0–100.0)
Monocytes Absolute: 0.7 10*3/uL (ref 0.1–1.0)
Monocytes Relative: 10 %
Neutro Abs: 3.1 10*3/uL (ref 1.7–7.7)
Neutrophils Relative %: 46 %
Platelet Count: 187 10*3/uL (ref 150–400)
RBC: 3.27 MIL/uL — ABNORMAL LOW (ref 4.22–5.81)
RDW: 13.8 % (ref 11.5–15.5)
WBC Count: 6.8 10*3/uL (ref 4.0–10.5)
nRBC: 0 % (ref 0.0–0.2)

## 2019-12-29 LAB — FERRITIN: Ferritin: 487 ng/mL — ABNORMAL HIGH (ref 24–336)

## 2019-12-29 NOTE — Progress Notes (Signed)
Hematology and Oncology Follow Up Visit  Luis Chen [id number] [birth year] 79 y.o. 12/29/2019 9:38 AM  CC: [name], MD  [name], M.D.  [name], MD, [facility]    Principle Diagnosis:  79 year old man with:  1.  IgG MGUS diagnosed in 2012.  He was found to have M spike 0.2 g/dL     2.  Anemia related to chronic renal sufficiency and iron deficiency.   Current therapy: Iron replacement as needed.   Interim History: Luis Chen is here for repeat evaluation.  Since her last visit, he reports no major changes in his health.  He has reported issues with low blood pressure and his losartan dose was decreased.  He denies any chest pain, shortness of breath or palpitation.  He denies any lightheadedness.  Performance status and quality of life remains unchanged.    Medications: Reviewed without changes.  Marland Kitchen  aspirin EC 325 MG EC tablet, Take 1 tablet (325 mg total) by mouth daily., Disp: 30 tablet, Rfl: 0 .  carvedilol (COREG) 6.25 MG tablet, Take 1 tablet (6.25 mg total) by mouth 2 (two) times daily with a meal., Disp: 180 tablet, Rfl: 3 .  ezetimibe (ZETIA) 10 MG tablet, Take 1 tablet (10 mg total) by mouth daily., Disp: 30 tablet, Rfl: 11 .  feeding supplement, ENSURE ENLIVE, (ENSURE ENLIVE) LIQD, Take 237 mLs by mouth 3 (three) times daily between meals., Disp: 237 mL, Rfl: 12 .  Ferrous Gluconate 324 (37.5 Fe) MG TABS, TAKE 1 TABLET BY MOUTH TWICE DAILY WITH MEALS, Disp: 60 tablet, Rfl: 1 .  latanoprost (XALATAN) 0.005 % ophthalmic solution, Place 1 drop into both eyes at bedtime. , Disp: , Rfl:  .  losartan (COZAAR) 50 MG tablet, Take 1 tablet (50 mg total) by mouth daily., Disp: 90 tablet, Rfl: 3 .  rosuvastatin (CRESTOR) 40 MG tablet, TAKE 1 TABLET BY MOUTH ONCE DAILY AT  6PM, Disp: 90 tablet, Rfl: 3 .  spironolactone (ALDACTONE) 25 MG tablet, Take 1 tablet (25 mg total) by mouth daily., Disp: 90 tablet, Rfl: 3 .  traZODone (DESYREL) 150 MG tablet, Take 100 mg by  mouth at bedtime as needed for sleep. , Disp: , Rfl:   Allergies:  Allergies  Allergen Reactions  . Ace Inhibitors Swelling    Facial swelling   . Entresto [Sacubitril-Valsartan] Swelling    Angioedema with ACE Inhibitor       Physical Exam:  Blood pressure (!) 93/48, pulse 71, temperature 97.9 F (36.6 C), temperature source Tympanic, resp. rate 18, height _0  (1.778 m), weight 149 lb 6.4 oz (67.8 kg), SpO2 100 %.    ECOG: 0     General appearance: Alert, awake without any distress. Head: Atraumatic without abnormalities Oropharynx: Without any thrush or ulcers. Eyes: No scleral icterus. Lymph nodes: No lymphadenopathy noted in the cervical, supraclavicular, or axillary nodes Heart:regular rate and rhythm, without any murmurs or gallops.   Lung: Clear to auscultation without any rhonchi, wheezes or dullness to percussion. Abdomin: Soft, nontender without any shifting dullness or ascites. Musculoskeletal: No clubbing or cyanosis. Neurological: No motor or sensory deficits. Skin: No rashes or lesions.      Lab Results: Lab Results  Component Value Date   WBC 5.7 05/05/2019   HGB 10.2 (L) 05/05/2019   HCT 30.2 (L) 05/05/2019   MCV 96.8 05/05/2019   PLT 192.0 05/05/2019     Chemistry      Component Value Date/Time   NA 140 05/05/2019 0849  NA 139 09/14/2018 0000   NA 139 06/12/2016 1313   K 4.6 05/05/2019 0849   K 4.4 06/12/2016 1313   CL 109 05/05/2019 0849   CO2 26 05/05/2019 0849   CO2 25 06/12/2016 1313   BUN 25 (H) 05/05/2019 0849   BUN 27 (A) 09/14/2018 0000   BUN 17.9 06/12/2016 1313   CREATININE 1.55 (H) 05/05/2019 0849   CREATININE 1.86 (H) 12/30/2018 1522   CREATININE 1.2 06/12/2016 1313   GLU 92 09/14/2018 0000      Component Value Date/Time   CALCIUM 9.6 05/05/2019 0849   CALCIUM 9.3 06/12/2016 1313   ALKPHOS 66 05/05/2019 0849   ALKPHOS 98 06/12/2016 1313   AST 22 05/05/2019 0849   AST 22 12/30/2018 1522   AST 18 06/12/2016  1313   ALT 21 05/05/2019 0849   ALT 19 12/30/2018 1522   ALT 16 06/12/2016 1313   BILITOT 0.4 05/05/2019 0849   BILITOT 0.4 12/30/2018 1522   BILITOT 0.44 06/12/2016 1313        Results for COLBERT, CURENTON (MRN [id number]) as of 12/29/2019 09:39  Ref. Range 06/12/2016 13:13 07/02/2017 08:44 12/30/2018 15:22  M Protein SerPl Elph-Mcnc Latest Ref Range: Not Observed g/dL 0.2 (H) 0.2 (H) 0.3 (H)  IFE 1 Unknown Comment Comment Comment (A)  Globulin, Total Latest Ref Range: 2.2 - 3.9 g/dL 3.1 3.0 3.0  B-Globulin SerPl Elph-Mcnc Latest Ref Range: 0.7 - 1.3 g/dL 0.8 0.9 0.9  IgG (Immunoglobin G), Serum Latest Ref Range: 603 - 1,613 mg/dL 1,338 1,325 1,397  IgM (Immunoglobulin M), Srm Latest Ref Range: 15 - 143 mg/dL  167 (H) 162 (H)  IgM, Qn, Serum Latest Ref Range: 15 - 143 mg/dL 180 (H)    IgA Latest Ref Range: 61 - 437 mg/dL  144 141     Assessment and plan:  79 year old man with the following:  1.  IgG MGUS diagnosed in 2012 without any active multiple myeloma and no indication for treatment.  He continues to be on active surveillance at this time.  Reviewed natural course of this disease and indication for treatments were reiterated.  Protein studies obtained in October of 2020 showed an M spike that has not changed dramatically around 0.3 g/dL and normal IgG level.  At this time I see no evidence to suggest evolving symptomatic myeloma and active surveillance is recommended.  We will continue to monitor this annually.    2.  Multifactorial anemia: Related to chronic renal insufficiency and iron deficiency.  His hemoglobin in February 2021 is close to baseline has not required any intervention.  We will continue to update his iron studies and replace as needed.  His hemoglobin today is close to baseline without any growth factor support at this time.    3.  Chronic renal insufficiency: His kidney function unchanged at this time with creatinine clearance around 50 cc/min.  4.   Follow-up: In 1 year for repeat follow-up.   30  minutes were dedicated to this visit. The time was spent on reviewing laboratory data, discussing treatment options, discussing differential diagnosis and answering questions regarding future plan.   Zola Button, MD 10/22/20219:38 AM

## 2020-01-01 LAB — KAPPA/LAMBDA LIGHT CHAINS
Kappa free light chain: 116.6 mg/L — ABNORMAL HIGH (ref 3.3–19.4)
Kappa, lambda light chain ratio: 4.35 — ABNORMAL HIGH (ref 0.26–1.65)
Lambda free light chains: 26.8 mg/L — ABNORMAL HIGH (ref 5.7–26.3)

## 2020-01-01 LAB — MULTIPLE MYELOMA PANEL, SERUM
Albumin SerPl Elph-Mcnc: 3.6 g/dL (ref 2.9–4.4)
Albumin/Glob SerPl: 1.1 (ref 0.7–1.7)
Alpha 1: 0.2 g/dL (ref 0.0–0.4)
Alpha2 Glob SerPl Elph-Mcnc: 0.8 g/dL (ref 0.4–1.0)
B-Globulin SerPl Elph-Mcnc: 1 g/dL (ref 0.7–1.3)
Gamma Glob SerPl Elph-Mcnc: 1.4 g/dL (ref 0.4–1.8)
Globulin, Total: 3.4 g/dL (ref 2.2–3.9)
IgA: 159 mg/dL (ref 61–437)
IgG (Immunoglobin G), Serum: 1339 mg/dL (ref 603–1613)
IgM (Immunoglobulin M), Srm: 178 mg/dL — ABNORMAL HIGH (ref 15–143)
M Protein SerPl Elph-Mcnc: 0.2 g/dL — ABNORMAL HIGH
Total Protein ELP: 7 g/dL (ref 6.0–8.5)

## 2020-01-17 DIAGNOSIS — N1831 Chronic kidney disease, stage 3a: Secondary | ICD-10-CM | POA: Diagnosis not present

## 2020-01-17 DIAGNOSIS — I129 Hypertensive chronic kidney disease with stage 1 through stage 4 chronic kidney disease, or unspecified chronic kidney disease: Secondary | ICD-10-CM | POA: Diagnosis not present

## 2020-01-26 ENCOUNTER — Telehealth: Payer: Self-pay

## 2020-01-26 NOTE — Progress Notes (Unsigned)
Chronic Care Management Pharmacy Assistant   Name: Luis Chen  MRN: [id number] DOB: 1940/03/17  Reason for Encounter: Disease State  PCP : Leamon Arnt, MD  Allergies:   Allergies  Allergen Reactions  . Ace Inhibitors Swelling    Facial swelling   . Entresto [Sacubitril-Valsartan] Swelling    Angioedema with ACE Inhibitor    Medications: Outpatient Encounter Medications as of 01/26/2020  Medication Sig  . aspirin 81 MG EC tablet Take 1 tablet (81 mg total) by mouth daily.  . carvedilol (COREG) 6.25 MG tablet Take 1 tablet (6.25 mg total) by mouth 2 (two) times daily with a meal.  . ezetimibe (ZETIA) 10 MG tablet Take 1 tablet (10 mg total) by mouth daily.  . feeding supplement, ENSURE ENLIVE, (ENSURE ENLIVE) LIQD Take 237 mLs by mouth 3 (three) times daily between meals.  . Ferrous Gluconate 324 (37.5 Fe) MG TABS TAKE 1 TABLET BY MOUTH TWICE DAILY WITH MEALS  . furosemide (LASIX) 20 MG tablet Take 20 mg by mouth daily.  Marland Kitchen latanoprost (XALATAN) 0.005 % ophthalmic solution Place 1 drop into both eyes at bedtime.   Marland Kitchen losartan (COZAAR) 50 MG tablet Take 1 tablet (50 mg total) by mouth daily. (Patient taking differently: Take 50 mg by mouth in the morning and at bedtime. )  . rosuvastatin (CRESTOR) 40 MG tablet TAKE 1 TABLET BY MOUTH ONCE DAILY AT  6PM  . spironolactone (ALDACTONE) 25 MG tablet Take 1 tablet (25 mg total) by mouth daily.  . traZODone (DESYREL) 150 MG tablet Take 150 mg by mouth at bedtime as needed for sleep.    No facility-administered encounter medications on file as of 01/26/2020.    Current Diagnosis: Patient Active Problem List   Diagnosis Date Noted  . S/P CABG x 3 02/17/2018  . CAD (coronary artery disease) 02/08/2018  . Systolic HF (heart failure) ([place]) 02/08/2018  . Nonischemic cardiomyopathy ([place]) 07/03/2015  . Anemia, chronic disease 07/01/2015  . Primary osteoarthritis of knees, bilateral 12/10/2014  . MGUS (monoclonal gammopathy of  unknown significance) 06/30/2010  . Chronic kidney disease (CKD), stage III (moderate) ([place]) 03/24/2010  . Benign essential hypertension 10/31/2008  . Mixed hyperlipidemia 10/30/2008     Reviewed chart prior to disease state call. Spoke with patient regarding BP  Recent Office Vitals: BP Readings from Last 3 Encounters:  12/29/19 (!) 93/48  07/27/19 120/72  05/05/19 110/62   Pulse Readings from Last 3 Encounters:  12/29/19 71  07/27/19 63  05/05/19 71    Wt Readings from Last 3 Encounters:  12/29/19 149 lb 6.4 oz (67.8 kg)  07/27/19 153 lb 3.2 oz (69.5 kg)  05/05/19 150 lb 9.6 oz (68.3 kg)     Kidney Function Lab Results  Component Value Date/Time   CREATININE 2.13 (H) 12/29/2019 09:26 AM   CREATININE 1.55 (H) 05/05/2019 08:49 AM   CREATININE 1.73 (H) 01/27/2019 08:42 AM   CREATININE 1.86 (H) 12/30/2018 03:22 PM   CREATININE 1.2 06/12/2016 01:13 PM   CREATININE 1.4 (H) 11/08/2015 02:17 PM   GFR 52.37 (L) 05/05/2019 08:49 AM   GFRNONAA 31 (L) 12/29/2019 09:26 AM   GFRAA 42 (L) 01/27/2019 08:42 AM   GFRAA 39 (L) 12/30/2018 03:22 PM    BMP Latest Ref Rng & Units 12/29/2019 05/05/2019 01/27/2019  Glucose 70 - 99 mg/dL 93 93 137(H)  BUN 8 - 23 mg/dL 38(H) 25(H) 25(H)  Creatinine 0.61 - 1.24 mg/dL 2.13(H) 1.55(H) 1.73(H)  BUN/Creat Ratio 10 -  24 - - -  Sodium 135 - 145 mmol/L 139 140 141  Potassium 3.5 - 5.1 mmol/L 5.0 4.6 4.4  Chloride 98 - 111 mmol/L 108 109 108  CO2 22 - 32 mmol/L 24 26 24   Calcium 8.9 - 10.3 mg/dL 9.7 9.6 9.3    . Current antihypertensive regimen:  o Carvedilol 6.25mg  Tab o Losartan 50 mg Tab . How often are you checking your Blood Pressure? {CHL HP BP Monitoring Frequency:[phone]} . Current home BP readings: *** . What recent interventions/DTPs have been made by any provider to improve Blood Pressure control since last CPP Visit: *** . Any recent hospitalizations or ED visits since last visit with CPP? No . What diet changes have been made  to improve Blood Pressure Control?  o *** . What exercise is being done to improve your Blood Pressure Control?  o ***  Adherence Review: Is the patient currently on ACE/ARB medication? Yes Does the patient have >5 day gap between last estimated fill dates? CPP to review   [place] ,[place] Pharmacist Assistant [phone]   Follow-Up:  Pharmacist Review

## 2020-02-09 ENCOUNTER — Telehealth: Payer: Self-pay

## 2020-02-09 NOTE — Progress Notes (Signed)
Chronic Care Management Pharmacy Assistant   Name: Luis Chen  MRN: [id number] DOB: [birth year]  Reason for Encounter: Disease State  PCP : Leamon Arnt, MD  Allergies:   Allergies  Allergen Reactions  . Ace Inhibitors Swelling    Facial swelling   . Entresto [Sacubitril-Valsartan] Swelling    Angioedema with ACE Inhibitor    Medications: Outpatient Encounter Medications as of 02/09/2020  Medication Sig  . aspirin 81 MG EC tablet Take 1 tablet (81 mg total) by mouth daily.  . carvedilol (COREG) 6.25 MG tablet Take 1 tablet (6.25 mg total) by mouth 2 (two) times daily with a meal.  . ezetimibe (ZETIA) 10 MG tablet Take 1 tablet (10 mg total) by mouth daily.  . feeding supplement, ENSURE ENLIVE, (ENSURE ENLIVE) LIQD Take 237 mLs by mouth 3 (three) times daily between meals.  . Ferrous Gluconate 324 (37.5 Fe) MG TABS TAKE 1 TABLET BY MOUTH TWICE DAILY WITH MEALS  . furosemide (LASIX) 20 MG tablet Take 20 mg by mouth daily.  Marland Kitchen latanoprost (XALATAN) 0.005 % ophthalmic solution Place 1 drop into both eyes at bedtime.   Marland Kitchen losartan (COZAAR) 50 MG tablet Take 1 tablet (50 mg total) by mouth daily. (Patient taking differently: Take 50 mg by mouth in the morning and at bedtime. )  . rosuvastatin (CRESTOR) 40 MG tablet TAKE 1 TABLET BY MOUTH ONCE DAILY AT  6PM  . spironolactone (ALDACTONE) 25 MG tablet Take 1 tablet (25 mg total) by mouth daily.  . traZODone (DESYREL) 150 MG tablet Take 150 mg by mouth at bedtime as needed for sleep.    No facility-administered encounter medications on file as of 02/09/2020.    Current Diagnosis: Patient Active Problem List   Diagnosis Date Noted  . S/P CABG x 3 02/17/2018  . CAD (coronary artery disease) 02/08/2018  . Systolic HF (heart failure) ([place]) 02/08/2018  . Nonischemic cardiomyopathy ([place]) 07/03/2015  . Anemia, chronic disease 07/01/2015  . Primary osteoarthritis of knees, bilateral 12/10/2014  . MGUS (monoclonal gammopathy of  unknown significance) 06/30/2010  . Chronic kidney disease (CKD), stage III (moderate) ([place]) 03/24/2010  . Benign essential hypertension 10/31/2008  . Mixed hyperlipidemia 10/30/2008   Reviewed chart prior to disease state call. Spoke with patient regarding BP  Recent Office Vitals: BP Readings from Last 3 Encounters:  12/29/19 (!) 93/48  07/27/19 120/72  05/05/19 110/62   Pulse Readings from Last 3 Encounters:  12/29/19 71  07/27/19 63  05/05/19 71    Wt Readings from Last 3 Encounters:  12/29/19 149 lb 6.4 oz (67.8 kg)  07/27/19 153 lb 3.2 oz (69.5 kg)  05/05/19 150 lb 9.6 oz (68.3 kg)     Kidney Function Lab Results  Component Value Date/Time   CREATININE 2.13 (H) 12/29/2019 09:26 AM   CREATININE 1.55 (H) 05/05/2019 08:49 AM   CREATININE 1.73 (H) 01/27/2019 08:42 AM   CREATININE 1.86 (H) 12/30/2018 03:22 PM   CREATININE 1.2 06/12/2016 01:13 PM   CREATININE 1.4 (H) 11/08/2015 02:17 PM   GFR 52.37 (L) 05/05/2019 08:49 AM   GFRNONAA 31 (L) 12/29/2019 09:26 AM   GFRAA 42 (L) 01/27/2019 08:42 AM   GFRAA 39 (L) 12/30/2018 03:22 PM    BMP Latest Ref Rng & Units 12/29/2019 05/05/2019 01/27/2019  Glucose 70 - 99 mg/dL 93 93 137(H)  BUN 8 - 23 mg/dL 38(H) 25(H) 25(H)  Creatinine 0.61 - 1.24 mg/dL 2.13(H) 1.55(H) 1.73(H)  BUN/Creat Ratio 10 - 24 - - -  Sodium 135 - 145 mmol/L 139 140 141  Potassium 3.5 - 5.1 mmol/L 5.0 4.6 4.4  Chloride 98 - 111 mmol/L 108 109 108  CO2 22 - 32 mmol/L 24 26 24   Calcium 8.9 - 10.3 mg/dL 9.7 9.6 9.3    . Current antihypertensive regimen:      - carvedilol (COREG) 6.25 MG tablet     - losartan (COZAAR) 50 MG tablet . How often are you checking your Blood Pressure? daily . Current home BP readings: Patient stated his readings are ranging from115/ 63 and 117/67 . What recent interventions/DTPs have been made by any provider to improve Blood Pressure control since last CPP Visit: None Noted . Any recent hospitalizations or ED visits since  last visit with CPP? No . What diet changes have been made to improve Blood Pressure Control?  o Patient stated he does not eat red meats and does not eat rice . Patient states he tries to stay away from carbs. . What exercise is being done to improve your Blood Pressure Control?  o Patient states he walks twice a week and rides his elliptical bike at home.  Adherence Review: Is the patient currently on ACE/ARB medication? Yes Does the patient have >5 day gap between last estimated fill dates? CPP to review   [place] ,[place] Pharmacist Assistant [phone]   Follow-Up:  Pharmacist Review

## 2020-02-15 ENCOUNTER — Telehealth: Payer: Self-pay | Admitting: Family Medicine

## 2020-02-15 NOTE — Telephone Encounter (Signed)
Left message for patient to call back and schedule Medicare Annual Wellness Visit (AWV) either virtually OR in office.   Last AWV 01/16/19; please schedule at anytime with [facility]-Nurse Health Advisor at [facility].  This should be a 45 minute visit.

## 2020-03-30 DIAGNOSIS — E785 Hyperlipidemia, unspecified: Secondary | ICD-10-CM | POA: Insufficient documentation

## 2020-03-30 NOTE — Progress Notes (Signed)
Cardiology Office Note   Date:  04/01/2020   ID:  DREVON PLOG, DOB [birth year], MRN [id number]  PCP:  Leamon Arnt, MD  Cardiologist:   [name], MD   Chief Complaint  Patient presents with  . Coronary Artery Disease      History of Present Illness: Luis Chen is a 80 y.o. male who presents for follow up of systolic HF with improved EF from 25% in 2010 ->50%2011 due to NICM,nonobstructiveCAD, CKD, HL, and HTN.   He had cardiac cath on 02/2018 with severe multivessel disease.  He had CABG in Dec.  His EF on MRI was 22%.  He subsequently underwent CABG with SVG-LAD (LIMA inadequate as a graft) and sequential SVG-OM1/OM2.  He has a history of angioedema on he was on Entresto in the past, this was discontinued due to as he had history of angioedema with ACE inhibitor.  Last echocardiogram performed on 01/18/2019 showed EF 50 to 55%.    Since I last saw him he bowls twice a week.  He rides his exercise bike.  He has stairs in his home. The patient denies any new symptoms such as chest discomfort, neck or arm discomfort. There has been no new shortness of breath, PND or orthopnea. There have been no reported palpitations, presyncope or syncope.   Past Medical History:  Diagnosis Date  . Anemia, chronic disease 07/01/2015  . Angina decubitus ([place])   . Benign essential hypertension 10/31/2008   Qualifier: Diagnosis of  By: Percival Spanish, MD, Farrel Gordon    . CAD (coronary artery disease)    non obstructive. Left main normal. LAd proximal long 25% stenosis, termingating as focal 50% prox to mid lesion. First & second diag were small, normal. Circumflex in proximal av groove had luminal irregularities.. Was large mid obtuse marg which was branching, scattered luminal irregul. Infer branch did supply some septal perforators. Was PDA,small & normal.  < 1.14mm. There was prox 95% stenosis  . CARDIOMYOPATHY 11/27/2008  . CKD (chronic kidney disease)   . Hyperlipidemia    transient  history, controlled on diet  . Hypertension   . Mixed hyperlipidemia 10/30/2008   Qualifier: Diagnosis of  By: Lovette Cliche, CNA, [name]    . Non-traumatic rhabdomyolysis 07/03/2015  . Polyarthropathy 07/01/2015  . Primary osteoarthritis of knees, bilateral 12/10/2014  . Pyogenic arthritis of left knee joint ([place])   . Swelling of left knee joint 07/01/2015  . Systolic HF (heart failure) ([place]) 02/08/2018    Past Surgical History:  Procedure Laterality Date  . CORONARY ARTERY BYPASS GRAFT N/A 02/17/2018   Procedure: CORONARY ARTERY BYPASS GRAFTING (CABG);  Surgeon: [name], MD;  Location: [facility];  Service: Open Heart Surgery;  Laterality: N/A;  Times 3 using endoscopically harvested right saphenous vein.    Marland Kitchen KNEE ARTHROSCOPY Left 07/01/2015   Procedure: Irrigation and debridement left knee arthroscopy;  Surgeon: Renette Butters, MD;  Location: [facility];  Service: Orthopedics;  Laterality: Left;  . RIGHT/LEFT HEART CATH AND CORONARY ANGIOGRAPHY N/A 02/08/2018   Procedure: RIGHT/LEFT HEART CATH AND CORONARY ANGIOGRAPHY;  Surgeon: Jettie Booze, MD;  Location: [facility];  Service: Cardiovascular;  Laterality: N/A;  . TEE WITHOUT CARDIOVERSION N/A 02/17/2018   Procedure: TRANSESOPHAGEAL ECHOCARDIOGRAM (TEE);  Surgeon: [name], MD;  Location: [facility];  Service: Open Heart Surgery;  Laterality: N/A;     Current Outpatient Medications  Medication Sig Dispense Refill  . acetic acid 2 % otic solution     .  aspirin 81 MG EC tablet Take 1 tablet (81 mg total) by mouth daily.    . carvedilol (COREG) 6.25 MG tablet Take 1 tablet (6.25 mg total) by mouth 2 (two) times daily with a meal. 180 tablet 3  . feeding supplement, ENSURE ENLIVE, (ENSURE ENLIVE) LIQD Take 237 mLs by mouth 3 (three) times daily between meals. 237 mL 12  . Ferrous Gluconate 324 (37.5 Fe) MG TABS TAKE 1 TABLET BY MOUTH TWICE DAILY WITH MEALS 60 tablet 1  . furosemide (LASIX) 20 MG tablet Take 20 mg by mouth daily.     Marland Kitchen latanoprost (XALATAN) 0.005 % ophthalmic solution Place 1 drop into both eyes at bedtime.     Marland Kitchen losartan (COZAAR) 50 MG tablet Take 1 tablet (50 mg total) by mouth daily. (Patient taking differently: Take 50 mg by mouth in the morning and at bedtime.) 90 tablet 3  . rosuvastatin (CRESTOR) 40 MG tablet TAKE 1 TABLET BY MOUTH ONCE DAILY AT  6PM 90 tablet 3  . traZODone (DESYREL) 150 MG tablet Take 150 mg by mouth at bedtime as needed for sleep.     Marland Kitchen ezetimibe (ZETIA) 10 MG tablet Take 1 tablet (10 mg total) by mouth daily. 30 tablet 11  . spironolactone (ALDACTONE) 25 MG tablet Take 1 tablet (25 mg total) by mouth daily. 90 tablet 3   No current facility-administered medications for this visit.    Allergies:   Ace inhibitors and Entresto [sacubitril-valsartan]    ROS:  Please see the history of present illness.   Otherwise, review of systems are positive for none.   All other systems are reviewed and negative.    PHYSICAL EXAM: VS:  BP 102/60 (BP Location: Left Arm, Patient Position: Sitting)   Pulse (!) 55   Ht 5\' 10"  (1.778 m)   Wt 154 lb 12.8 oz (70.2 kg)   SpO2 96%   BMI 22.21 kg/m  , BMI Body mass index is 22.21 kg/m. GENERAL:  Well appearing NECK:  No jugular venous distention, waveform within normal limits, carotid upstroke brisk and symmetric, no bruits, no thyromegaly LUNGS:  Clear to auscultation bilaterally CHEST:  Well healed sternotomy scar. HEART:  PMI not displaced or sustained,S1 and S2 within normal limits, no S3, no S4, no clicks, no rubs, no murmurs ABD:  Flat, positive bowel sounds normal in frequency in pitch, no bruits, no rebound, no guarding, no midline pulsatile mass, no hepatomegaly, no splenomegaly EXT:  2 plus pulses throughout, no edema, no cyanosis no clubbing   EKG:  EKG is ordered today. The ekg ordered today demonstrates sinus rhythm, rate 55, axis within normal limits, intervals within normal limits, nonspecific lateral T wave  inversions.   Recent Labs: 05/05/2019: TSH 2.02 12/29/2019: ALT 17; BUN 38; Creatinine 2.13; Hemoglobin 10.0; Platelet Count 187; Potassium 5.0; Sodium 139    Lipid Panel    Component Value Date/Time   CHOL 129 05/05/2019 0849   TRIG 59.0 05/05/2019 0849   HDL 41.60 05/05/2019 0849   CHOLHDL 3 05/05/2019 0849   VLDL 11.8 05/05/2019 0849   LDLCALC 75 05/05/2019 0849      Wt Readings from Last 3 Encounters:  04/01/20 154 lb 12.8 oz (70.2 kg)  12/29/19 149 lb 6.4 oz (67.8 kg)  07/27/19 153 lb 3.2 oz (69.5 kg)      Other studies Reviewed: Additional studies/ records that were reviewed today include: None Review of the above records demonstrates:  NA   ASSESSMENT AND PLAN:  Chronic  systolic CHF:      He seems to be euvolemic.  No change in therapy.  CAD s/p CABG x 3: He has done well post bypass.  He is having no angina.  Continue with risk reduction.  CKD III: I will check basic metabolic profile today.  DYSLIPIDEMIA: He had an excellent lipid profile last year and I will repeat this.  He will stay on the meds as listed for now.    Current medicines are reviewed at length with the patient today.  The patient does not have concerns regarding medicines.  The following changes have been made:  None  Labs/ tests ordered today include:   Orders Placed This Encounter  Procedures  . Comprehensive metabolic panel  . Lipid panel  . CBC  . EKG 12-Lead     Disposition:   FU with me in one year.    Signed, [name], MD  04/01/2020 9:53 AM    [facility]

## 2020-04-01 ENCOUNTER — Encounter: Payer: Self-pay | Admitting: Cardiology

## 2020-04-01 ENCOUNTER — Other Ambulatory Visit: Payer: Self-pay

## 2020-04-01 ENCOUNTER — Ambulatory Visit (INDEPENDENT_AMBULATORY_CARE_PROVIDER_SITE_OTHER): Payer: Medicare Other | Admitting: Cardiology

## 2020-04-01 VITALS — BP 102/60 | HR 55 | Ht 70.0 in | Wt 154.8 lb

## 2020-04-01 DIAGNOSIS — I2510 Atherosclerotic heart disease of native coronary artery without angina pectoris: Secondary | ICD-10-CM

## 2020-04-01 DIAGNOSIS — I5022 Chronic systolic (congestive) heart failure: Secondary | ICD-10-CM | POA: Diagnosis not present

## 2020-04-01 DIAGNOSIS — E785 Hyperlipidemia, unspecified: Secondary | ICD-10-CM

## 2020-04-01 DIAGNOSIS — N183 Chronic kidney disease, stage 3 unspecified: Secondary | ICD-10-CM | POA: Diagnosis not present

## 2020-04-01 LAB — COMPREHENSIVE METABOLIC PANEL
ALT: 21 IU/L (ref 0–44)
AST: 27 IU/L (ref 0–40)
Albumin/Globulin Ratio: 1.6 (ref 1.2–2.2)
Albumin: 4.2 g/dL (ref 3.6–4.6)
Alkaline Phosphatase: 98 IU/L (ref 44–121)
BUN/Creatinine Ratio: 12 (ref 10–24)
BUN: 18 mg/dL (ref 8–27)
Bilirubin Total: 0.3 mg/dL (ref 0.0–1.2)
CO2: 22 mmol/L (ref 20–29)
Calcium: 9.5 mg/dL (ref 8.6–10.2)
Chloride: 107 mmol/L — ABNORMAL HIGH (ref 96–106)
Creatinine, Ser: 1.47 mg/dL — ABNORMAL HIGH (ref 0.76–1.27)
GFR calc Af Amer: 51 mL/min/{1.73_m2} — ABNORMAL LOW (ref >59)
GFR calc non Af Amer: 44 mL/min/{1.73_m2} — ABNORMAL LOW (ref >59)
Globulin, Total: 2.7 g/dL (ref 1.5–4.5)
Glucose: 87 mg/dL (ref 65–99)
Potassium: 5.4 mmol/L — ABNORMAL HIGH (ref 3.5–5.2)
Sodium: 140 mmol/L (ref 134–144)
Total Protein: 6.9 g/dL (ref 6.0–8.5)

## 2020-04-01 LAB — LIPID PANEL
Chol/HDL Ratio: 2.5 ratio (ref 0.0–5.0)
Cholesterol, Total: 145 mg/dL (ref 100–199)
HDL: 59 mg/dL (ref >39)
LDL Chol Calc (NIH): 73 mg/dL (ref 0–99)
Triglycerides: 61 mg/dL (ref 0–149)
VLDL Cholesterol Cal: 13 mg/dL (ref 5–40)

## 2020-04-01 LAB — CBC
Hematocrit: 34.2 % — ABNORMAL LOW (ref 37.5–51.0)
Hemoglobin: 10.9 g/dL — ABNORMAL LOW (ref 13.0–17.7)
MCH: 31.1 pg (ref 26.6–33.0)
MCHC: 31.9 g/dL (ref 31.5–35.7)
MCV: 97 fL (ref 79–97)
Platelets: 187 10*3/uL (ref 150–450)
RBC: 3.51 x10E6/uL — ABNORMAL LOW (ref 4.14–5.80)
RDW: 13.2 % (ref 11.6–15.4)
WBC: 5.1 10*3/uL (ref 3.4–10.8)

## 2020-04-01 NOTE — Patient Instructions (Signed)
Medication Instructions:  No changes *If you need a refill on your cardiac medications before your next appointment, please call your pharmacy*   Lab Work: Your provider would like for you to have the following labs today: CBC, BMET, and Lipid  If you have labs (blood work) drawn today and your tests are completely normal, you will receive your results only by: Marland Kitchen MyChart Message (if you have MyChart) OR . A paper copy in the mail If you have any lab test that is abnormal or we need to change your treatment, we will call you to review the results.   Testing/Procedures: None ordered   Follow-Up: At [facility], you and your health needs are our priority.  As part of our continuing mission to provide you with exceptional heart care, we have created designated Provider Care Teams.  These Care Teams include your primary Cardiologist (physician) and Advanced Practice Providers (APPs -  Physician Assistants and Nurse Practitioners) who all work together to provide you with the care you need, when you need it.  We recommend signing up for the patient portal called "MyChart".  Sign up information is provided on this After Visit Summary.  MyChart is used to connect with patients for Virtual Visits (Telemedicine).  Patients are able to view lab/test results, encounter notes, upcoming appointments, etc.  Non-urgent messages can be sent to your provider as well.   To learn more about what you can do with MyChart, go to [email or web address].    Your next appointment:   12 month(s)  The format for your next appointment:   In Person  Provider:   You may see [name], MD or one of the following Advanced Practice Providers on your designated Care Team:    Rosaria Ferries, PA-C  Jory Sims, DNP, ANP

## 2020-04-04 ENCOUNTER — Telehealth: Payer: Self-pay

## 2020-04-04 NOTE — Progress Notes (Cosign Needed)
error 

## 2020-04-09 ENCOUNTER — Telehealth: Payer: Self-pay | Admitting: Cardiology

## 2020-04-09 DIAGNOSIS — I129 Hypertensive chronic kidney disease with stage 1 through stage 4 chronic kidney disease, or unspecified chronic kidney disease: Secondary | ICD-10-CM | POA: Diagnosis not present

## 2020-04-09 DIAGNOSIS — D472 Monoclonal gammopathy: Secondary | ICD-10-CM | POA: Diagnosis not present

## 2020-04-09 DIAGNOSIS — N1831 Chronic kidney disease, stage 3a: Secondary | ICD-10-CM | POA: Diagnosis not present

## 2020-04-09 DIAGNOSIS — E611 Iron deficiency: Secondary | ICD-10-CM | POA: Diagnosis not present

## 2020-04-09 DIAGNOSIS — N179 Acute kidney failure, unspecified: Secondary | ICD-10-CM | POA: Diagnosis not present

## 2020-04-09 DIAGNOSIS — I429 Cardiomyopathy, unspecified: Secondary | ICD-10-CM | POA: Diagnosis not present

## 2020-04-09 DIAGNOSIS — D631 Anemia in chronic kidney disease: Secondary | ICD-10-CM | POA: Diagnosis not present

## 2020-04-09 DIAGNOSIS — Z79899 Other long term (current) drug therapy: Secondary | ICD-10-CM

## 2020-04-09 NOTE — Telephone Encounter (Signed)
    Pt returning call to get his lab result. He said he will be at his doctor's office and nurse can call him after 11 am today

## 2020-04-09 NOTE — Telephone Encounter (Signed)
[name], MD  04/07/2020 10:39 AM EST      I would like for him to have a repeat BMET this week. If the potassium is still elevated he will need to stp the spironolactone. Call Luis Chen with the results and send results to Leamon Arnt, MD   Returned call to patient regarding the above results. Patient made aware and verbalized understanding. Patient is going to come this week for repeat BMET- order has been placed.

## 2020-04-11 ENCOUNTER — Other Ambulatory Visit: Payer: Self-pay

## 2020-04-11 DIAGNOSIS — E875 Hyperkalemia: Secondary | ICD-10-CM

## 2020-04-11 DIAGNOSIS — Z79899 Other long term (current) drug therapy: Secondary | ICD-10-CM | POA: Diagnosis not present

## 2020-04-11 LAB — BASIC METABOLIC PANEL
BUN/Creatinine Ratio: 14 (ref 10–24)
BUN: 23 mg/dL (ref 8–27)
CO2: 23 mmol/L (ref 20–29)
Calcium: 9.2 mg/dL (ref 8.6–10.2)
Chloride: 110 mmol/L — ABNORMAL HIGH (ref 96–106)
Creatinine, Ser: 1.69 mg/dL — ABNORMAL HIGH (ref 0.76–1.27)
GFR calc Af Amer: 43 mL/min/{1.73_m2} — ABNORMAL LOW (ref >59)
GFR calc non Af Amer: 37 mL/min/{1.73_m2} — ABNORMAL LOW (ref >59)
Glucose: 68 mg/dL (ref 65–99)
Potassium: 5.1 mmol/L (ref 3.5–5.2)
Sodium: 141 mmol/L (ref 134–144)

## 2020-04-11 NOTE — Progress Notes (Signed)
BMP placed per result note.

## 2020-04-16 ENCOUNTER — Telehealth: Payer: Self-pay

## 2020-04-16 NOTE — Chronic Care Management (AMB) (Signed)
Chronic Care Management Pharmacy Assistant   Name: Luis Chen  MRN: [id number] DOB: [birth year]  Reason for Encounter: Disease State/ Hypertension Adherence Call  Patient Questions:  1.  Have you seen any other providers since your last visit? Yes, 04/01/2020 [facility] [name], MD  2.  Any changes in your medicines or health? No, patient states he has not had any changes in his medicines or health recently.   PCP : Leamon Arnt, MD  Allergies:   Allergies  Allergen Reactions  . Ace Inhibitors Swelling    Facial swelling   . Entresto [Sacubitril-Valsartan] Swelling    Angioedema with ACE Inhibitor    Medications: Outpatient Encounter Medications as of 04/16/2020  Medication Sig  . acetic acid 2 % otic solution   . aspirin 81 MG EC tablet Take 1 tablet (81 mg total) by mouth daily.  . carvedilol (COREG) 6.25 MG tablet Take 1 tablet (6.25 mg total) by mouth 2 (two) times daily with a meal.  . ezetimibe (ZETIA) 10 MG tablet Take 1 tablet (10 mg total) by mouth daily.  . feeding supplement, ENSURE ENLIVE, (ENSURE ENLIVE) LIQD Take 237 mLs by mouth 3 (three) times daily between meals.  . Ferrous Gluconate 324 (37.5 Fe) MG TABS TAKE 1 TABLET BY MOUTH TWICE DAILY WITH MEALS  . furosemide (LASIX) 20 MG tablet Take 20 mg by mouth daily.  Marland Kitchen latanoprost (XALATAN) 0.005 % ophthalmic solution Place 1 drop into both eyes at bedtime.   Marland Kitchen losartan (COZAAR) 50 MG tablet Take 1 tablet (50 mg total) by mouth daily. (Patient taking differently: Take 50 mg by mouth in the morning and at bedtime.)  . rosuvastatin (CRESTOR) 40 MG tablet TAKE 1 TABLET BY MOUTH ONCE DAILY AT  6PM  . spironolactone (ALDACTONE) 25 MG tablet Take 1 tablet (25 mg total) by mouth daily.  . traZODone (DESYREL) 150 MG tablet Take 150 mg by mouth at bedtime as needed for sleep.    No facility-administered encounter medications on file as of 04/16/2020.    Current Diagnosis: Patient Active Problem List    Diagnosis Date Noted  . Dyslipidemia 03/30/2020  . S/P CABG x 3 02/17/2018  . CAD (coronary artery disease) 02/08/2018  . Systolic HF (heart failure) ([place]) 02/08/2018  . Nonischemic cardiomyopathy ([place]) 07/03/2015  . Anemia, chronic disease 07/01/2015  . Primary osteoarthritis of knees, bilateral 12/10/2014  . MGUS (monoclonal gammopathy of unknown significance) 06/30/2010  . Chronic kidney disease (CKD), stage III (moderate) ([place]) 03/24/2010  . Benign essential hypertension 10/31/2008  . Mixed hyperlipidemia 10/30/2008    Reviewed chart prior to disease state call. Spoke with patient regarding BP  Recent Office Vitals: BP Readings from Last 3 Encounters:  04/01/20 102/60  12/29/19 (!) 93/48  07/27/19 120/72   Pulse Readings from Last 3 Encounters:  04/01/20 (!) 55  12/29/19 71  07/27/19 63    Wt Readings from Last 3 Encounters:  04/01/20 154 lb 12.8 oz (70.2 kg)  12/29/19 149 lb 6.4 oz (67.8 kg)  07/27/19 153 lb 3.2 oz (69.5 kg)     Kidney Function Lab Results  Component Value Date/Time   CREATININE 1.69 (H) 04/11/2020 10:46 AM   CREATININE 1.47 (H) 04/01/2020 09:41 AM   CREATININE 2.13 (H) 12/29/2019 09:26 AM   CREATININE 1.86 (H) 12/30/2018 03:22 PM   CREATININE 1.2 06/12/2016 01:13 PM   CREATININE 1.4 (H) 11/08/2015 02:17 PM   GFR 52.37 (L) 05/05/2019 08:49 AM   GFRNONAA 37 (  L) 04/11/2020 10:46 AM   GFRNONAA 31 (L) 12/29/2019 09:26 AM   GFRAA 43 (L) 04/11/2020 10:46 AM   GFRAA 39 (L) 12/30/2018 03:22 PM    BMP Latest Ref Rng & Units 04/11/2020 04/01/2020 12/29/2019  Glucose 65 - 99 mg/dL 68 87 93  BUN 8 - 27 mg/dL 23 18 38(H)  Creatinine 0.76 - 1.27 mg/dL 1.69(H) 1.47(H) 2.13(H)  BUN/Creat Ratio 10 - 24 14 12  -  Sodium 134 - 144 mmol/L 141 140 139  Potassium 3.5 - 5.2 mmol/L 5.1 5.4(H) 5.0  Chloride 96 - 106 mmol/L 110(H) 107(H) 108  CO2 20 - 29 mmol/L 23 22 24   Calcium 8.6 - 10.2 mg/dL 9.2 9.5 9.7    . Current antihypertensive regimen:  o Carvedilol  6.25 mg tablet twice daily o Losartan 50 mg tablet daily o Spironolactone 25 mg tablet daily  . How often are you checking your Blood Pressure? 3-4x per week  . Current home BP readings: 117/65, 115/75  . What recent interventions/DTPs have been made by any provider to improve Blood Pressure control since last CPP Visit: Patient states he is currently taking his medications as prescribed.  . Any recent hospitalizations or ED visits since last visit with CPP? No , patient has not had any recent hospitalizations or ED visits since his last visit with Madelin Rear, CPP.  Marland Kitchen What diet changes have been made to improve Blood Pressure Control?  o Patient states he has increased his vegetable intake and states he only eat [place], chicken and fish instead of red meats.  . What exercise is being done to improve your Blood Pressure Control?  o Patient states he goes bowling twice a week and uses his stationary exercise bike at home twice a week as well.  Adherence Review: Is the patient currently on ACE/ARB medication? Yes Does the patient have >5 day gap between last estimated fill dates? No  Future Appointments  Date Time Provider [place]  05/07/2020  1:00 PM [facility] CCM PHARMACIST [facility]  05/28/2020 10:00 AM Leamon Arnt, MD [facility]  12/27/2020  1:00 PM [facility] LAB [facility] None  12/27/2020  1:30 PM Wyatt Portela, MD [facility] None   April D Calhoun, [place] Pharmacist Assistant [phone]   Follow-Up:  Pharmacist Review

## 2020-04-30 DIAGNOSIS — I129 Hypertensive chronic kidney disease with stage 1 through stage 4 chronic kidney disease, or unspecified chronic kidney disease: Secondary | ICD-10-CM | POA: Diagnosis not present

## 2020-04-30 DIAGNOSIS — N1831 Chronic kidney disease, stage 3a: Secondary | ICD-10-CM | POA: Diagnosis not present

## 2020-05-02 ENCOUNTER — Other Ambulatory Visit: Payer: Self-pay

## 2020-05-02 DIAGNOSIS — E875 Hyperkalemia: Secondary | ICD-10-CM

## 2020-05-06 ENCOUNTER — Telehealth: Payer: Self-pay

## 2020-05-06 NOTE — Chronic Care Management (AMB) (Signed)
    Chronic Care Management Pharmacy Assistant   Name: Luis Chen  MRN: [id number] DOB: [birth year]  Reason for Encounter: Chart Review  PCP : Leamon Arnt, MD  Allergies:   Allergies  Allergen Reactions  . Ace Inhibitors Swelling    Facial swelling   . Entresto [Sacubitril-Valsartan] Swelling    Angioedema with ACE Inhibitor    Medications: Outpatient Encounter Medications as of 05/06/2020  Medication Sig  . acetic acid 2 % otic solution   . aspirin 81 MG EC tablet Take 1 tablet (81 mg total) by mouth daily.  . carvedilol (COREG) 6.25 MG tablet Take 1 tablet (6.25 mg total) by mouth 2 (two) times daily with a meal.  . ezetimibe (ZETIA) 10 MG tablet Take 1 tablet (10 mg total) by mouth daily.  . feeding supplement, ENSURE ENLIVE, (ENSURE ENLIVE) LIQD Take 237 mLs by mouth 3 (three) times daily between meals.  . Ferrous Gluconate 324 (37.5 Fe) MG TABS TAKE 1 TABLET BY MOUTH TWICE DAILY WITH MEALS  . furosemide (LASIX) 20 MG tablet Take 20 mg by mouth daily.  Marland Kitchen latanoprost (XALATAN) 0.005 % ophthalmic solution Place 1 drop into both eyes at bedtime.   Marland Kitchen losartan (COZAAR) 50 MG tablet Take 1 tablet (50 mg total) by mouth daily. (Patient taking differently: Take 50 mg by mouth in the morning and at bedtime.)  . rosuvastatin (CRESTOR) 40 MG tablet TAKE 1 TABLET BY MOUTH ONCE DAILY AT  6PM  . spironolactone (ALDACTONE) 25 MG tablet Take 1 tablet (25 mg total) by mouth daily.  . traZODone (DESYREL) 150 MG tablet Take 150 mg by mouth at bedtime as needed for sleep.    No facility-administered encounter medications on file as of 05/06/2020.    Current Diagnosis: Patient Active Problem List   Diagnosis Date Noted  . Dyslipidemia 03/30/2020  . S/P CABG x 3 02/17/2018  . CAD (coronary artery disease) 02/08/2018  . Systolic HF (heart failure) ([place]) 02/08/2018  . Nonischemic cardiomyopathy ([place]) 07/03/2015  . Anemia, chronic disease 07/01/2015  . Primary osteoarthritis of knees,  bilateral 12/10/2014  . MGUS (monoclonal gammopathy of unknown significance) 06/30/2010  . Chronic kidney disease (CKD), stage III (moderate) ([place]) 03/24/2010  . Benign essential hypertension 10/31/2008  . Mixed hyperlipidemia 10/30/2008    Reviewed chart for medication changes.  No OVs, Consults, or hospital visits since last care coordination call/Pharmacist visit. No medication changes indicated.  Future Appointments  Date Time Provider [place]  05/07/2020  1:00 PM [facility] CCM PHARMACIST [facility]  05/28/2020 10:00 AM Leamon Arnt, MD [facility]  12/27/2020  1:00 PM [facility] LAB [facility] None  12/27/2020  1:30 PM Wyatt Portela, MD [facility] None     April D Calhoun, [place] Pharmacist Assistant [phone]   Follow-Up:  Pharmacist Review

## 2020-05-07 ENCOUNTER — Ambulatory Visit: Payer: Medicare Other

## 2020-05-07 NOTE — Progress Notes (Deleted)
Chronic Care Management Pharmacy Note  05/07/2020 Name:  Luis Chen MRN:  [id number] DOB:  May 26, 1940  Subjective: Negan Grudzien Seyller is an 80 y.o. year old male who is a primary patient of Leamon Arnt, MD.  The CCM team was consulted for assistance with disease management and care coordination needs.   {CCMTELEPHONEFACETOFACE:21091510} for {CCMINITIALFOLLOWUPCHOICE:21091511} in response to provider referral for pharmacy case management and/or care coordination services.   Consent to Services:  {CCMCONSENTOPTIONS:25074} Patient Care Team: Leamon Arnt, MD as PCP - General (Family Medicine) Larey Dresser, MD as PCP - Advanced Heart Failure (Cardiology) [name], MD as PCP - Cardiology (Cardiology) Renette Butters, MD as Attending Physician (Orthopedic Surgery) [name], MD as Consulting Physician (Nephrology) Caryl Pina, MD (Hematology) [name], MD as Consulting Physician (Cardiology) [facility] Wyatt Portela, MD as Consulting Physician (Oncology) [facility] [place] as Consulting Physician Madelin Rear, [facility] as Pharmacist (Pharmacist)  Recent office visits: *** Recent consult visits: 04/01/2020 (Dr Percival Spanish):   Objective: Lab Results  Component Value Date   CREATININE 1.69 (H) 04/11/2020   BUN 23 04/11/2020   GFR 52.37 (L) 05/05/2019   GFRNONAA 37 (L) 04/11/2020   GFRAA 43 (L) 04/11/2020   NA 141 04/11/2020   K 5.1 04/11/2020   CALCIUM 9.2 04/11/2020   CO2 23 04/11/2020   Lab Results  Component Value Date/Time   HGBA1C 5.7 (H) 02/16/2018 11:18 AM   HGBA1C  10/19/2008 05:25 AM    6.1 (NOTE) The ADA recommends the following therapeutic goal for glycemic control related to Hgb A1c measurement: Goal of therapy: <6.5 Hgb A1c  Reference: American Diabetes Association: Clinical Practice Recommendations 2010, Diabetes Care, 2010, 33: (Suppl  1).   GFR 52.37 (L) 05/05/2019 08:49 AM   GFR 59.82 (L) 03/04/2018 02:04 PM     Last diabetic Eye exam: No results found for: HMDIABEYEEXA  Last diabetic Foot exam: No results found for: HMDIABFOOTEX  Lab Results  Component Value Date   CHOL 145 04/01/2020   HDL 59 04/01/2020   LDLCALC 73 04/01/2020   TRIG 61 04/01/2020   CHOLHDL 2.5 04/01/2020   Hepatic Function Latest Ref Rng & Units 04/01/2020 12/29/2019 05/05/2019  Total Protein 6.0 - 8.5 g/dL 6.9 7.3 6.8  Albumin 3.6 - 4.6 g/dL 4.2 3.6 3.6  AST 0 - 40 IU/L '27 20 22  ' ALT 0 - 44 IU/L '21 17 21  ' Alk Phosphatase 44 - 121 IU/L 98 89 66  Total Bilirubin 0.0 - 1.2 mg/dL 0.3 0.4 0.4   Lab Results  Component Value Date/Time   TSH 2.02 05/05/2019 08:49 AM   TSH 1.54 10/31/2018 02:10 PM   FREET4 1.28 10/19/2008 01:15 PM   CBC Latest Ref Rng & Units 04/01/2020 12/29/2019 05/05/2019  WBC 3.4 - 10.8 x10E3/uL 5.1 6.8 5.7  Hemoglobin 13.0 - 17.7 g/dL 10.9(L) 10.0(L) 10.2(L)  Hematocrit 37.5 - 51.0 % 34.2(L) 31.5(L) 30.2(L)  Platelets 150 - 450 x10E3/uL 187 187 192.0   No results found for: VD25OH  Clinical ASCVD: {YES/NO:21197} The ASCVD Risk score Mikey Bussing DC Jr., et al., 2013) failed to calculate for the following reasons:   The 2013 ASCVD risk score is only valid for ages 98 to 31    Depression screen PHQ 2/9 01/16/2019 05/02/2018 08/25/2017  Decreased Interest 0 0 0  Down, Depressed, Hopeless 0 0 0  PHQ - 2 Score 0 0 0    Social History   Tobacco Use  Smoking Status Never  Smoker  Smokeless Tobacco Never Used   BP Readings from Last 3 Encounters:  04/01/20 102/60  12/29/19 (!) 93/48  07/27/19 120/72   Pulse Readings from Last 3 Encounters:  04/01/20 (!) 55  12/29/19 71  07/27/19 63   Wt Readings from Last 3 Encounters:  04/01/20 154 lb 12.8 oz (70.2 kg)  12/29/19 149 lb 6.4 oz (67.8 kg)  07/27/19 153 lb 3.2 oz (69.5 kg)   Assessment/Interventions: Review of patient past medical history, allergies, medications, health status, including review of consultants reports, laboratory and other test data, was  performed as part of comprehensive evaluation and provision of chronic care management services.   SDOH:  (Social Determinants of Health) assessments and interventions performed: {yes/no:20286}  CCM Care Plan Allergies  Allergen Reactions  . Ace Inhibitors Swelling    Facial swelling   . Entresto [Sacubitril-Valsartan] Swelling    Angioedema with ACE Inhibitor   Medications Reviewed Today    Reviewed by [name], MD (Physician) on 04/01/20 at [phone]  Med List Status: <None>  Medication Order Taking? Sig Documenting Provider Last Dose Status Informant  acetic acid 2 % otic solution [id number] Yes  [provider] Taking Active   aspirin 81 MG EC tablet [id number] Yes Take 1 tablet (81 mg total) by mouth daily. Almyra Deforest, [place] Taking Active   carvedilol (COREG) 6.25 MG tablet [id number] Yes Take 1 tablet (6.25 mg total) by mouth 2 (two) times daily with a meal. Larey Dresser, MD Taking Active   ezetimibe (ZETIA) 10 MG tablet [id number]  Take 1 tablet (10 mg total) by mouth daily. Larey Dresser, MD  Expired 12/05/19 2359   feeding supplement, ENSURE ENLIVE, (ENSURE ENLIVE) LIQD [id number] Yes Take 237 mLs by mouth 3 (three) times daily between meals. Lavina Hamman, MD Taking Active Self  Ferrous Gluconate 324 (37.5 Fe) MG TABS [id number] Yes TAKE 1 TABLET BY MOUTH TWICE DAILY WITH MEALS Shadad, Mathis Dad, MD Taking Active   furosemide (LASIX) 20 MG tablet [id number] Yes Take 20 mg by mouth daily. [provider] Taking Active Self  latanoprost (XALATAN) 0.005 % ophthalmic solution [id number] Yes Place 1 drop into both eyes at bedtime.  [provider] Taking Active Self  losartan (COZAAR) 50 MG tablet [id number] Yes Take 1 tablet (50 mg total) by mouth daily.  Patient taking differently: Take 50 mg by mouth in the morning and at bedtime.   Larey Dresser, MD Taking Active   rosuvastatin (CRESTOR) 40 MG tablet [id number] Yes TAKE 1 TABLET BY MOUTH ONCE DAILY AT  Felicity Pellegrini, MD Taking Active   spironolactone (ALDACTONE) 25 MG tablet [id number]  Take 1 tablet (25 mg total) by mouth daily. Larey Dresser, MD  Expired 03/06/19 2359   traZODone (DESYREL) 150 MG tablet [id number] Yes Take 150 mg by mouth at bedtime as needed for sleep.  [provider] Taking Active Self         Patient Active Problem List   Diagnosis Date Noted  . Dyslipidemia 03/30/2020  . S/P CABG x 3 02/17/2018  . CAD (coronary artery disease) 02/08/2018  . Systolic HF (heart failure) ([place]) 02/08/2018  . Nonischemic cardiomyopathy ([place]) 07/03/2015  . Anemia, chronic disease 07/01/2015  . Primary osteoarthritis of knees, bilateral 12/10/2014  . MGUS (monoclonal gammopathy of unknown significance) 06/30/2010  . Chronic kidney disease (CKD), stage III (moderate) ([place]) 03/24/2010  . Benign essential hypertension 10/31/2008  . Mixed hyperlipidemia 10/30/2008   Immunization History  Administered Date(s) Administered  . Fluad Quad(high Dose 65+) 10/31/2018  . Influenza Split 11/03/2010  . Influenza, High Dose Seasonal PF 11/30/2011, 12/06/2013, 12/10/2014, 12/20/2015, 02/10/2018  . Influenza,inj,Quad PF,6+ Mos 12/05/2012  . Influenza-Unspecified 01/31/2017  . Moderna Sars-Covid-2 Vaccination 04/20/2019  . Pneumococcal Conjugate-13 12/06/2013  . Pneumococcal Polysaccharide-23 03/09/2008  . Tdap 12/03/2008  . Zoster 03/09/2008    Conditions to be addressed/monitored:  {USCCMDZASSESSMENTOPTIONS:23563} There are no care plans that you recently modified to display for this patient.   Medication Assistance: {MEDASSISTANCEINFO:25044} Patient's preferred pharmacy is:  [facility] [address] [place] [address] Phone: [phone] Fax: [phone]  Uses pill box? {Yes or If no, why not?:20788}. Pt endorses ***% compliance. We discussed: {Pharmacy options:24294}. Patient decided to: {US Pharmacy  SWNI:62703} Patient agrees to Care Plan and Follow-up***.  Current Barriers:  . {pharmacybarriers:24917}  Pharmacist Clinical Goal(s):  Marland Kitchen Over the next *** days, patient will {PHARMACYGOALCHOICES:24921} through collaboration with PharmD and provider.  . ***  Interventions: . 1:1 collaboration with Leamon Arnt, MD regarding development and update of comprehensive plan of care as evidenced by provider attestation and co-signature . Inter-disciplinary care team collaboration (see longitudinal plan of care) . Comprehensive medication review performed; medication list updated in electronic medical record  Hypertension (BP goal <130/80) -{US controlled/uncontrolled:25276} -Current treatment: . *** -Medications previously tried: ***  -Current home readings: *** -Current dietary habits: *** -Current exercise habits: *** -{ACTIONS;DENIES/REPORTS:21021675::"Denies"} hypotensive/hypertensive symptoms -Educated on {CCM BP Counseling:25124} -Counseled to monitor BP at home ***, document, and provide log at future appointments -{CCMPHARMDINTERVENTION:25122}  Hyperlipidemia: (LDL goal < ***) -{US controlled/uncontrolled:25276} -Current treatment: . *** -Medications previously tried: ***  -Current dietary patterns: *** -Current exercise habits: *** -Educated on {CCM HLD Counseling:25126} -{CCMPHARMDINTERVENTION:25122}  Heart Failure (Goal: control symptoms and prevent exacerbations) -{US controlled/uncontrolled:25276}  -01/2019 Echo: LVEF 50-55% Type: {type of heart failure:30421350} -NYHA Class: {CHL HP Upstream Pharm NYHA Class:[phone]} -Ejection fraction: *** (Date: ***) -Current treatment:  *** -Medications previously tried: *** -Current home BP/HR readings: *** -Current dietary habits: *** -Current exercise routine: *** -Educated on {CCM HF Counseling:25125} -{CCMPHARMDINTERVENTION:25122}  Osteoporosis / Osteopenia (Goal ***) -{US  controlled/uncontrolled:25276} -Last DEXA Scan: ***   T-Score femoral neck: ***  T-Score total hip: ***  T-Score lumbar spine: ***  T-Score forearm radius: ***  10-year probability of major osteoporotic fracture: ***  10-year probability of hip fracture: *** -Patient {is;is not an osteoporosis candidate:23886} -Current treatment  . *** -Medications previously tried: ***  -{Osteoporosis Counseling:23892} -{CCMPHARMDINTERVENTION:25122}  *** (Goal: ***) -{US controlled/uncontrolled:25276} -Current treatment  . *** -Medications previously tried: ***  -{CCMPHARMDINTERVENTION:25122}  Health Maintenance -Vaccine gaps: *** -Current therapy:  . *** -Educated on {ccm supplement counseling:25128} -{CCM Patient satisfied:25129} -{CCMPHARMDINTERVENTION:25122}  Patient Goals/Self-Care Activities . Over the next *** days, patient will:  - {pharmacypatientgoals:24919}  Future Appointments  Date Time Provider [place]  05/07/2020  1:00 PM [facility] CCM PHARMACIST [facility]  05/28/2020 10:00 AM Leamon Arnt, MD [facility]  12/27/2020  1:00 PM [facility] LAB [facility] None  12/27/2020  1:30 PM Shadad, Mathis Dad, MD [facility] None   Follow-up plan with Care Management Team: . CPA: *** . RPH: *** visit  Madelin Rear, Pharm.D., BCGP Clinical Pharmacist [place] [phone]

## 2020-05-07 NOTE — Chronic Care Management (AMB) (Signed)
  Chronic Care Management   Outreach Note   Name: Luis Chen MRN: [id number] DOB: November 12, 1940  Referred by: Leamon Arnt, MD Reason for referral: Telephone Appointment with [place] Pharmacist, Madelin Rear.   An unsuccessful telephone outreach was attempted today. The patient was referred to the pharmacist for assistance with care management and care coordination.   Telephone appointment with clinical pharmacist today (05/07/2020) at 1pm. If patient immediately returns call, transfer to [phone]. Otherwise, please provide this number so patient can reschedule visit.   Madelin Rear, Pharm.D., BCGP Clinical Pharmacist [facility] [phone]

## 2020-05-10 ENCOUNTER — Telehealth: Payer: Self-pay

## 2020-05-10 NOTE — Chronic Care Management (AMB) (Signed)
Chronic Care Management Pharmacy Assistant   Name: Luis Chen  MRN: [id number] DOB: 1940/09/10  Reason for Encounter: Disease State/ Hypertension Adherence Call  PCP : Leamon Arnt, MD  Allergies:   Allergies  Allergen Reactions   Ace Inhibitors Swelling    Facial swelling    Entresto [Sacubitril-Valsartan] Swelling    Angioedema with ACE Inhibitor    Medications: Outpatient Encounter Medications as of 05/10/2020  Medication Sig   acetic acid 2 % otic solution    aspirin 81 MG EC tablet Take 1 tablet (81 mg total) by mouth daily.   carvedilol (COREG) 6.25 MG tablet Take 1 tablet (6.25 mg total) by mouth 2 (two) times daily with a meal.   ezetimibe (ZETIA) 10 MG tablet Take 1 tablet (10 mg total) by mouth daily.   feeding supplement, ENSURE ENLIVE, (ENSURE ENLIVE) LIQD Take 237 mLs by mouth 3 (three) times daily between meals.   Ferrous Gluconate 324 (37.5 Fe) MG TABS TAKE 1 TABLET BY MOUTH TWICE DAILY WITH MEALS   furosemide (LASIX) 20 MG tablet Take 20 mg by mouth daily.   latanoprost (XALATAN) 0.005 % ophthalmic solution Place 1 drop into both eyes at bedtime.    losartan (COZAAR) 50 MG tablet Take 1 tablet (50 mg total) by mouth daily. (Patient taking differently: Take 50 mg by mouth in the morning and at bedtime.)   rosuvastatin (CRESTOR) 40 MG tablet TAKE 1 TABLET BY MOUTH ONCE DAILY AT  6PM   spironolactone (ALDACTONE) 25 MG tablet Take 1 tablet (25 mg total) by mouth daily.   traZODone (DESYREL) 150 MG tablet Take 150 mg by mouth at bedtime as needed for sleep.    No facility-administered encounter medications on file as of 05/10/2020.    Current Diagnosis: Patient Active Problem List   Diagnosis Date Noted   Dyslipidemia 03/30/2020   S/P CABG x 3 02/17/2018   CAD (coronary artery disease) 54/56/2563   Systolic HF (heart failure) ([place]) 02/08/2018   Nonischemic cardiomyopathy ([place]) 07/03/2015   Anemia, chronic disease 07/01/2015    Primary osteoarthritis of knees, bilateral 12/10/2014   MGUS (monoclonal gammopathy of unknown significance) 06/30/2010   Chronic kidney disease (CKD), stage III (moderate) (HCC) 03/24/2010   Benign essential hypertension 10/31/2008   Mixed hyperlipidemia 10/30/2008    Reviewed chart prior to disease state call.   Recent Office Vitals: BP Readings from Last 3 Encounters:  04/01/20 102/60  12/29/19 (!) 93/48  07/27/19 120/72   Pulse Readings from Last 3 Encounters:  04/01/20 (!) 55  12/29/19 71  07/27/19 63    Wt Readings from Last 3 Encounters:  04/01/20 154 lb 12.8 oz (70.2 kg)  12/29/19 149 lb 6.4 oz (67.8 kg)  07/27/19 153 lb 3.2 oz (69.5 kg)     Kidney Function Lab Results  Component Value Date/Time   CREATININE 1.69 (H) 04/11/2020 10:46 AM   CREATININE 1.47 (H) 04/01/2020 09:41 AM   CREATININE 2.13 (H) 12/29/2019 09:26 AM   CREATININE 1.86 (H) 12/30/2018 03:22 PM   CREATININE 1.2 06/12/2016 01:13 PM   CREATININE 1.4 (H) 11/08/2015 02:17 PM   GFR 52.37 (L) 05/05/2019 08:49 AM   GFRNONAA 37 (L) 04/11/2020 10:46 AM   GFRNONAA 31 (L) 12/29/2019 09:26 AM   GFRAA 43 (L) 04/11/2020 10:46 AM   GFRAA 39 (L) 12/30/2018 03:22 PM    BMP Latest Ref Rng & Units 04/11/2020 04/01/2020 12/29/2019  Glucose 65 - 99 mg/dL 68 87 93  BUN 8 -  27 mg/dL 23 18 38(H)  Creatinine 0.76 - 1.27 mg/dL 1.69(H) 1.47(H) 2.13(H)  BUN/Creat Ratio 10 - 24 14 12  -  Sodium 134 - 144 mmol/L 141 140 139  Potassium 3.5 - 5.2 mmol/L 5.1 5.4(H) 5.0  Chloride 96 - 106 mmol/L 110(H) 107(H) 108  CO2 20 - 29 mmol/L 23 22 24   Calcium 8.6 - 10.2 mg/dL 9.2 9.5 9.7     Current antihypertensive regimen:  o Carvedilol 6.25 mg two times daily o Losartan 50 mg daily o Spironolactone 25 mg daily   How often are you checking your Blood Pressure?    Current home BP readings:    What recent interventions/DTPs have been made by any provider to improve Blood Pressure control since last CPP Visit:    Any  recent hospitalizations or ED visits since last visit with CPP? No    What diet changes have been made to improve Blood Pressure Control?  o    What exercise is being done to improve your Blood Pressure Control?  o   Adherence Review: Is the patient currently on ACE/ARB medication? Yes Does the patient have >5 day gap between last estimated fill dates? No   **Several unsuccessful attempts made to contact the patient for hypertension adherence call and to reschedule his missed appointment with Madelin Rear, CPP. I left messages for the patient to return call**  April D Calhoun, [place] Pharmacist Assistant [phone]   Follow-Up:  Pharmacist Review

## 2020-05-28 ENCOUNTER — Encounter: Payer: Self-pay | Admitting: Family Medicine

## 2020-05-28 ENCOUNTER — Ambulatory Visit (INDEPENDENT_AMBULATORY_CARE_PROVIDER_SITE_OTHER): Payer: Medicare Other | Admitting: Family Medicine

## 2020-05-28 ENCOUNTER — Other Ambulatory Visit: Payer: Self-pay

## 2020-05-28 VITALS — BP 98/52 | HR 57 | Temp 97.6°F | Resp 16 | Ht 72.0 in | Wt 146.4 lb

## 2020-05-28 DIAGNOSIS — N183 Chronic kidney disease, stage 3 unspecified: Secondary | ICD-10-CM | POA: Diagnosis not present

## 2020-05-28 DIAGNOSIS — D638 Anemia in other chronic diseases classified elsewhere: Secondary | ICD-10-CM

## 2020-05-28 DIAGNOSIS — D472 Monoclonal gammopathy: Secondary | ICD-10-CM

## 2020-05-28 DIAGNOSIS — I25118 Atherosclerotic heart disease of native coronary artery with other forms of angina pectoris: Secondary | ICD-10-CM | POA: Diagnosis not present

## 2020-05-28 DIAGNOSIS — I10 Essential (primary) hypertension: Secondary | ICD-10-CM

## 2020-05-28 DIAGNOSIS — I428 Other cardiomyopathies: Secondary | ICD-10-CM

## 2020-05-28 DIAGNOSIS — E782 Mixed hyperlipidemia: Secondary | ICD-10-CM | POA: Diagnosis not present

## 2020-05-28 DIAGNOSIS — I5022 Chronic systolic (congestive) heart failure: Secondary | ICD-10-CM | POA: Diagnosis not present

## 2020-05-28 LAB — CBC WITH DIFFERENTIAL/PLATELET
Basophils Absolute: 0 10*3/uL (ref 0.0–0.1)
Basophils Relative: 0.9 % (ref 0.0–3.0)
Eosinophils Absolute: 0.4 10*3/uL (ref 0.0–0.7)
Eosinophils Relative: 8.2 % — ABNORMAL HIGH (ref 0.0–5.0)
HCT: 31.3 % — ABNORMAL LOW (ref 39.0–52.0)
Hemoglobin: 10.8 g/dL — ABNORMAL LOW (ref 13.0–17.0)
Lymphocytes Relative: 28.9 % (ref 12.0–46.0)
Lymphs Abs: 1.3 10*3/uL (ref 0.7–4.0)
MCHC: 34.5 g/dL (ref 30.0–36.0)
MCV: 94.3 fl (ref 78.0–100.0)
Monocytes Absolute: 0.4 10*3/uL (ref 0.1–1.0)
Monocytes Relative: 9.1 % (ref 3.0–12.0)
Neutro Abs: 2.5 10*3/uL (ref 1.4–7.7)
Neutrophils Relative %: 52.9 % (ref 43.0–77.0)
Platelets: 171 10*3/uL (ref 150.0–400.0)
RBC: 3.32 Mil/uL — ABNORMAL LOW (ref 4.22–5.81)
RDW: 14.8 % (ref 11.5–15.5)
WBC: 4.6 10*3/uL (ref 4.0–10.5)

## 2020-05-28 LAB — COMPREHENSIVE METABOLIC PANEL
ALT: 20 U/L (ref 0–53)
AST: 26 U/L (ref 0–37)
Albumin: 3.8 g/dL (ref 3.5–5.2)
Alkaline Phosphatase: 80 U/L (ref 39–117)
BUN: 31 mg/dL — ABNORMAL HIGH (ref 6–23)
CO2: 25 mEq/L (ref 19–32)
Calcium: 9.3 mg/dL (ref 8.4–10.5)
Chloride: 107 mEq/L (ref 96–112)
Creatinine, Ser: 1.73 mg/dL — ABNORMAL HIGH (ref 0.40–1.50)
GFR: 36.5 mL/min — ABNORMAL LOW (ref >60.00)
Glucose, Bld: 119 mg/dL — ABNORMAL HIGH (ref 70–99)
Potassium: 4.6 mEq/L (ref 3.5–5.1)
Sodium: 138 mEq/L (ref 135–145)
Total Bilirubin: 0.5 mg/dL (ref 0.2–1.2)
Total Protein: 6.5 g/dL (ref 6.0–8.3)

## 2020-05-28 LAB — LIPID PANEL
Cholesterol: 126 mg/dL (ref 0–200)
HDL: 43.5 mg/dL (ref >39.00)
LDL Cholesterol: 68 mg/dL (ref 0–99)
NonHDL: 82.19
Total CHOL/HDL Ratio: 3
Triglycerides: 69 mg/dL (ref 0.0–149.0)
VLDL: 13.8 mg/dL (ref 0.0–40.0)

## 2020-05-28 LAB — TSH: TSH: 1 u[IU]/mL (ref 0.35–4.50)

## 2020-05-28 MED ORDER — SHINGRIX 50 MCG/0.5ML IM SUSR: 0.5000 mL | Freq: Once | INTRAMUSCULAR | 0 refills | Status: AC

## 2020-05-28 MED ORDER — LOSARTAN POTASSIUM 50 MG PO TABS: 50.0000 mg | ORAL_TABLET | Freq: Every day | ORAL | 3 refills | Status: DC

## 2020-05-28 NOTE — Patient Instructions (Addendum)
Please return in 6 months for hypertension follow up. Please decrease the losartan dose to 50mg  daily.   I will release your lab results to you on your MyChart account with further instructions. Please reply with any questions.   Please take the prescription for Shingrix to the pharmacy so they may administer the vaccinations. Your insurance will then cover the injections.   I'm glad you are doing so well!  If you have any questions or concerns, please don't hesitate to send me a message via MyChart or call the office at [phone]. Thank you for visiting with [place] today! It's our pleasure caring for you.

## 2020-05-28 NOTE — Progress Notes (Signed)
Subjective  Chief Complaint  Patient presents with  . Annual Exam    Non-fasting   . Numbness    Bilateral legs and right hand, several episodes over the past 3 months. Happens mostly at night when he is in the bed - walking helps     HPI: Luis Chen is a 80 y.o. male who presents to [facility] [place] today for a Male Wellness Visit. He also has the concerns and/or needs as listed above in the chief complaint. These will be addressed in addition to the Health Maintenance Visit.   Wellness Visit: annual visit with health maintenance review and exam    HM: screens are up to date. He is doing very well. 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 Active. No new complaints. Eligible for shingrix vaccines  Body mass index is 19.86 kg/m. Wt Readings from Last 3 Encounters:  05/28/20 146 lb 6.4 oz (66.4 kg)  04/01/20 154 lb 12.8 oz (70.2 kg)  12/29/19 149 lb 6.4 oz (67.8 kg)     Chronic disease management visit and/or acute problem visit:  Htn: Feeling well. Taking medications w/o adverse effects. No symptoms of CHF, angina; no palpitations, sob, cp or lower extremity edema. Compliant with meds.  Blood pressures running low here, at cardiology visit and renal visit. I reviewed notes. No lightheadedness or orthostatic sxs. Home readings avg 893/73  Systolic heart failure has been well controlled.  No volume overload.  Systolic function improved to 50% on most recent echo.  No evidence of active ischemia with history of CAD.  Active.  On spironolactone, losartan which she takes 50 mg twice a day and low-dose Coreg 3.125 mg twice a day.  He does not use a daily diuretic.  Hyperkalemia: Have been monitoring closely while on ACE inhibitor and spironolactone  Anemia of chronic disease related to chronic kidney dysfunction: Denies decreased energy, blood in stool, shortness of breath.  MGUS: Stable by report.  Reviewed hematology notes.  Hyperlipidemia on  Zetia.  Well-tolerated  Has noted intermittent anterior shin numbness at night while sleeping intermittently, happens on both sides.  Once awakens and stands up resolves.  No foot tingling, numbness, no lower extremity weakness.  Does happen during the day.  No lower back pain  Patient Active Problem List   Diagnosis Date Noted  . S/P CABG x 3 02/17/2018  . CAD (coronary artery disease) 02/08/2018  . Systolic HF (heart failure) ([place]) 02/08/2018  . Nonischemic cardiomyopathy ([place]) 07/03/2015  . Anemia, chronic disease 07/01/2015  . Primary osteoarthritis of knees, bilateral 12/10/2014  . MGUS (monoclonal gammopathy of unknown significance) 06/30/2010  . Chronic kidney disease (CKD), stage III (moderate) ([place]) 03/24/2010  . Benign essential hypertension 10/31/2008  . Mixed hyperlipidemia 10/30/2008   Health Maintenance  Topic Date Due  . INFLUENZA VACCINE  07/14/2020 (Originally 10/08/2019)  . COVID-19 Vaccine (4 - Booster for Moderna series) 07/24/2020  . PNA vac Low Risk Adult  Completed  . HPV VACCINES  Aged Out  . TETANUS/TDAP  Discontinued   Immunization History  Administered Date(s) Administered  . Fluad Quad(high Dose 65+) 10/31/2018  . Influenza Split 11/03/2010  . Influenza, High Dose Seasonal PF 11/30/2011, 12/06/2013, 12/10/2014, 12/20/2015, 02/10/2018  . Influenza,inj,Quad PF,6+ Mos 12/05/2012  . Influenza-Unspecified 01/31/2017  . Moderna Sars-Covid-2 Vaccination 04/20/2019  . Pneumococcal Conjugate-13 12/06/2013  . Pneumococcal Polysaccharide-23 03/09/2008  . Tdap 12/03/2008  . Zoster 03/09/2008   We updated and reviewed the patient's past history  in detail and it is documented below. Allergies: Patient is allergic to ace inhibitors and entresto [sacubitril-valsartan]. Past Medical History  has a past medical history of Anemia, chronic disease (07/01/2015), Angina decubitus ([place]), Benign essential hypertension (10/31/2008), CAD (coronary artery disease),  CARDIOMYOPATHY (11/27/2008), CKD (chronic kidney disease), Hyperlipidemia, Hypertension, Mixed hyperlipidemia (10/30/2008), Non-traumatic rhabdomyolysis (07/03/2015), Polyarthropathy (07/01/2015), Primary osteoarthritis of knees, bilateral (12/10/2014), Pyogenic arthritis of left knee joint ([place]), Swelling of left knee joint (6/96/2952), and Systolic HF (heart failure) ([place]) (02/08/2018). Past Surgical History Patient  has a past surgical history that includes Knee arthroscopy (Left, 07/01/2015); RIGHT/LEFT HEART CATH AND CORONARY ANGIOGRAPHY (N/A, 02/08/2018); Coronary artery bypass graft (N/A, 02/17/2018); and TEE without cardioversion (N/A, 02/17/2018). Social History Patient  reports that he has never smoked. He has never used smokeless tobacco. He reports current alcohol use. He reports that he does not use drugs. Family History family history includes Arthritis in his mother; Diabetes in his mother; Heart attack (age of onset: 10) in his mother; Heart disease in his brother and mother; Hyperlipidemia in his mother; Hypertension in his mother; Hypothyroidism in his daughter; Stroke in his daughter. Review of Systems: Constitutional: negative for fever or malaise Ophthalmic: negative for photophobia, double vision or loss of vision Cardiovascular: negative for chest pain, dyspnea on exertion, or new LE swelling Respiratory: negative for SOB or persistent cough Gastrointestinal: negative for abdominal pain, change in bowel habits or melena Genitourinary: negative for dysuria or gross hematuria Musculoskeletal: negative for new gait disturbance or muscular weakness Integumentary: negative for new or persistent rashes Neurological: negative for TIA or stroke symptoms Psychiatric: negative for SI or delusions Allergic/Immunologic: negative for hives  Patient Care Team    Relationship Specialty Notifications Start End  Leamon Arnt, MD PCP - General Family Medicine  07/01/15   Larey Dresser, MD  PCP - Advanced Heart Failure Cardiology  12/05/18   [name], MD PCP - Cardiology Cardiology Admissions 07/26/19   Renette Butters, MD Attending Physician Orthopedic Surgery  02/23/17   [name], MD Consulting Physician Nephrology  02/23/17   Caryl Pina, MD  Hematology  02/23/17   [name], MD Consulting Physician Cardiology  02/23/17   [facility]    08/25/17   Wyatt Portela, MD Consulting Physician Oncology  01/16/19   Clinic, [facility] Consulting Physician   01/16/19   Madelin Rear, [facility] Pharmacist Pharmacist  10/04/19    Comment: PHONE NUMBER [phone]   Objective  Vitals: BP (!) 98/52   Pulse (!) 57   Temp 97.6 F (36.4 C) (Temporal)   Resp 16   Ht 6' (1.829 m)   Wt 146 lb 6.4 oz (66.4 kg)   SpO2 97%   BMI 19.86 kg/m  General:  Well developed, well nourished, no acute distress, looks great Psych:  Alert and orientedx3,normal mood and affect HEENT:  Normocephalic, atraumatic, non-icteric sclera, PERRL, oropharynx is clear without mass or exudate, supple neck without adenopathy, mass or thyromegaly Cardiovascular:  Normal S1, S2, RRR without gallop, rub or murmur, nondisplaced PMI, +2 distal pulses in bilateral upper and lower extremities. Respiratory:  Good breath sounds bilaterally, CTAB with normal respiratory effort Gastrointestinal: normal bowel sounds, soft, non-tender Extremities: No edema  Assessment  1. Chronic systolic heart failure ([place])   2. Benign essential hypertension   3. Mixed hyperlipidemia   4. Stage 3 chronic kidney disease, unspecified whether stage 3a or 3b CKD ([place])   5. MGUS (monoclonal gammopathy of unknown significance)  6. Coronary artery disease involving native coronary artery of native heart with other form of angina pectoris ([place])   7. Nonischemic cardiomyopathy ([place])   8. Anemia, chronic disease      Plan  Male Wellness Visit:  Age appropriate Health Maintenance and Prevention measures were  discussed with patient. Included topics are cancer screening recommendations, ways to keep healthy (see AVS) including dietary and exercise recommendations, regular eye and dental care, use of seat belts, and avoidance of moderate alcohol use and tobacco use.   BMI: discussed patient's BMI and encouraged positive lifestyle modifications to help get to or maintain a target BMI.  HM needs and immunizations were addressed and ordered. See below for orders. See HM and immunization section for updates.  Patient to get Shingrix vaccination at his  Routine labs and screening tests ordered including cmp, cbc and lipids where appropriate.  Discussed recommendations regarding Vit D and calcium supplementation (see AVS)  Chronic disease f/u and/or acute problem visit: (deemed necessary to be done in addition to the wellness visit):  Hypertension: Low blood pressures in the office.  Asymptomatic.  Given chronic kidney disease, recommend decrease losartan to 50 mg daily.  He will follow-up in 3 months ensure stability.  Recheck renal function electrolytes lipids.  Hyperlipidemia on Zetia nonfasting for recheck today.  Heart failure heart disease: Currently clinically stable for both.  Monitor potassium.  Adjust medications as noted above.  Chronic kidney disease and anemia: Recheck levels today.  MGUS: Stable and monitoring annually with hematology.  Leg numbness: Likely mechanical and positional.  Reassured.  Monitor.  Follow up: Return in about 6 months (around 11/28/2020) for follow up Hypertension, recheck.   Commons side effects, risks, benefits, and alternatives for medications and treatment plan prescribed today were discussed, and the patient expressed understanding of the given instructions. Patient is instructed to call or message via MyChart if he/she has any questions or concerns regarding our treatment plan. No barriers to understanding were identified. We discussed Red Flag symptoms and  signs in detail. Patient expressed understanding regarding what to do in case of urgent or emergency type symptoms.   Medication list was reconciled, printed and provided to the patient in AVS. Patient instructions and summary information was reviewed with the patient as documented in the AVS. This note was prepared with assistance of Dragon voice recognition software. Occasional wrong-word or sound-a-like substitutions may have occurred due to the inherent limitations of voice recognition software  This visit occurred during the SARS-CoV-2 public health emergency.  Safety protocols were in place, including screening questions prior to the visit, additional usage of staff PPE, and extensive cleaning of exam room while observing appropriate contact time as indicated for disinfecting solutions.   Orders Placed This Encounter  Procedures  . Comprehensive metabolic panel  . CBC with Differential/Platelet  . Lipid panel  . TSH  . Iron, TIBC and Ferritin Panel   Meds ordered this encounter  Medications  . losartan (COZAAR) 50 MG tablet    Sig: Take 1 tablet (50 mg total) by mouth daily.    Dispense:  90 tablet    Refill:  3  . Zoster Vaccine Adjuvanted [facility]) injection    Sig: Inject 0.5 mLs into the muscle once for 1 dose. Please give 2nd dose 2-6 months after first dose    Dispense:  2 each    Refill:  0

## 2020-05-29 LAB — IRON,TIBC AND FERRITIN PANEL
%SAT: 30 % (calc) (ref 20–48)
Ferritin: 331 ng/mL (ref 24–380)
Iron: 71 ug/dL (ref 50–180)
TIBC: 240 mcg/dL (calc) — ABNORMAL LOW (ref 250–425)

## 2020-07-24 ENCOUNTER — Telehealth: Payer: Self-pay

## 2020-07-24 NOTE — Chronic Care Management (AMB) (Signed)
Chronic Care Management Pharmacy Assistant   Name: Luis Chen  MRN: [id number] DOB: 03-20-1940  Reason for Encounter: Hypertension Disease State Call   Recent office visits:  05/28/20- Billey Chang, MD- Seen for wellness visit, chronic conditions addressed, Shingrix vaccine written, visit noted decreased losartan to 50 mg daily, medication changes noted carvedilol decreased from 1 tab bid  to 1/2 tab bid, follow up 6 months for hypertension  Recent consult visits:  No visits noted  Hospital visits:  None in previous 6 months  Medications: Outpatient Encounter Medications as of 07/24/2020  Medication Sig  . acetic acid 2 % otic solution   . aspirin 81 MG EC tablet Take 1 tablet (81 mg total) by mouth daily.  . carvedilol (COREG) 6.25 MG tablet Take 0.5 tablets (3.125 mg total) by mouth 2 (two) times daily with a meal.  . ezetimibe (ZETIA) 10 MG tablet Take 1 tablet (10 mg total) by mouth daily.  . feeding supplement, ENSURE ENLIVE, (ENSURE ENLIVE) LIQD Take 237 mLs by mouth 3 (three) times daily between meals.  . Ferrous Gluconate 324 (37.5 Fe) MG TABS TAKE 1 TABLET BY MOUTH TWICE DAILY WITH MEALS  . furosemide (LASIX) 20 MG tablet Take 20 mg by mouth daily.  Marland Kitchen latanoprost (XALATAN) 0.005 % ophthalmic solution Place 1 drop into both eyes at bedtime.   Marland Kitchen losartan (COZAAR) 50 MG tablet Take 1 tablet (50 mg total) by mouth daily.  . rosuvastatin (CRESTOR) 40 MG tablet TAKE 1 TABLET BY MOUTH ONCE DAILY AT  6PM  . spironolactone (ALDACTONE) 25 MG tablet Take 1 tablet (25 mg total) by mouth daily.  . traZODone (DESYREL) 150 MG tablet Take 150 mg by mouth at bedtime as needed for sleep.    No facility-administered encounter medications on file as of 07/24/2020.    Reviewed chart prior to disease state call. Spoke with patient regarding BP  Recent Office Vitals: BP Readings from Last 3 Encounters:  05/28/20 (!) 98/52  04/01/20 102/60  12/29/19 (!) 93/48   Pulse Readings from  Last 3 Encounters:  05/28/20 (!) 57  04/01/20 (!) 55  12/29/19 71    Wt Readings from Last 3 Encounters:  05/28/20 146 lb 6.4 oz (66.4 kg)  04/01/20 154 lb 12.8 oz (70.2 kg)  12/29/19 149 lb 6.4 oz (67.8 kg)     Kidney Function Lab Results  Component Value Date/Time   CREATININE 1.73 (H) 05/28/2020 10:42 AM   CREATININE 1.69 (H) 04/11/2020 10:46 AM   CREATININE 2.13 (H) 12/29/2019 09:26 AM   CREATININE 1.86 (H) 12/30/2018 03:22 PM   CREATININE 1.2 06/12/2016 01:13 PM   CREATININE 1.4 (H) 11/08/2015 02:17 PM   GFR 36.50 (L) 05/28/2020 10:42 AM   GFRNONAA 37 (L) 04/11/2020 10:46 AM   GFRNONAA 31 (L) 12/29/2019 09:26 AM   GFRAA 43 (L) 04/11/2020 10:46 AM   GFRAA 39 (L) 12/30/2018 03:22 PM    BMP Latest Ref Rng & Units 05/28/2020 04/11/2020 04/01/2020  Glucose 70 - 99 mg/dL 119(H) 68 87  BUN 6 - 23 mg/dL 31(H) 23 18  Creatinine 0.40 - 1.50 mg/dL 1.73(H) 1.69(H) 1.47(H)  BUN/Creat Ratio 10 - 24 - 14 12  Sodium 135 - 145 mEq/L 138 141 140  Potassium 3.5 - 5.1 mEq/L 4.6 5.1 5.4(H)  Chloride 96 - 112 mEq/L 107 110(H) 107(H)  CO2 19 - 32 mEq/L 25 23 22   Calcium 8.4 - 10.5 mg/dL 9.3 9.2 9.5   Several unsuccessful attempts made to  contact patient   Current antihypertensive regimen:  Carvedilol 6.25 mg tablet twice daily Losartan 50 mg tablet daily Spironolactone 25 mg tablet daily  How often are you checking your Blood Pressure?  Current home BP readings:   What recent interventions/DTPs have been made by any provider to improve Blood Pressure control since last CPP Visit:  Any recent hospitalizations or ED visits since last visit with CPP?  What diet changes have been made to improve Blood Pressure Control?    What exercise is being done to improve your Blood Pressure Control?   Adherence Review: Is the patient currently on ACE/ARB medication? Yes Does the patient have >5 day gap between last estimated fill dates? Yes Carvedilol 6.25 mg -  Spironolactone 25 mg- 90 DS  last filled 04/23/20  Star Rating Drugs: Rosuvastatin 40 mg Losartan 50 mg  [employer], CMA

## 2020-09-07 DIAGNOSIS — Z20822 Contact with and (suspected) exposure to covid-19: Secondary | ICD-10-CM | POA: Diagnosis not present

## 2020-10-18 ENCOUNTER — Telehealth: Payer: Self-pay | Admitting: Pharmacist

## 2020-10-18 NOTE — Chronic Care Management (AMB) (Addendum)
Chronic Care Management Pharmacy Assistant   Name: Luis Chen  MRN: [id number] DOB: July 03, 1940   Reason for Encounter: Hypertension Adherence Call   Recent office visits:  None  Recent consult visits:  None  Hospital visits:  None in previous 6 months  Medications: Outpatient Encounter Medications as of 10/18/2020  Medication Sig   acetic acid 2 % otic solution    aspirin 81 MG EC tablet Take 1 tablet (81 mg total) by mouth daily.   carvedilol (COREG) 6.25 MG tablet Take 0.5 tablets (3.125 mg total) by mouth 2 (two) times daily with a meal.   ezetimibe (ZETIA) 10 MG tablet Take 1 tablet (10 mg total) by mouth daily.   feeding supplement, ENSURE ENLIVE, (ENSURE ENLIVE) LIQD Take 237 mLs by mouth 3 (three) times daily between meals.   Ferrous Gluconate 324 (37.5 Fe) MG TABS TAKE 1 TABLET BY MOUTH TWICE DAILY WITH MEALS   furosemide (LASIX) 20 MG tablet Take 20 mg by mouth daily.   latanoprost (XALATAN) 0.005 % ophthalmic solution Place 1 drop into both eyes at bedtime.    losartan (COZAAR) 50 MG tablet Take 1 tablet (50 mg total) by mouth daily.   rosuvastatin (CRESTOR) 40 MG tablet TAKE 1 TABLET BY MOUTH ONCE DAILY AT  6PM   spironolactone (ALDACTONE) 25 MG tablet Take 1 tablet (25 mg total) by mouth daily.   traZODone (DESYREL) 150 MG tablet Take 150 mg by mouth at bedtime as needed for sleep.    No facility-administered encounter medications on file as of 10/18/2020.   Reviewed chart prior to disease state call. Spoke with patient regarding BP  Recent Office Vitals: BP Readings from Last 3 Encounters:  05/28/20 (!) 98/52  04/01/20 102/60  12/29/19 (!) 93/48   Pulse Readings from Last 3 Encounters:  05/28/20 (!) 57  04/01/20 (!) 55  12/29/19 71    Wt Readings from Last 3 Encounters:  05/28/20 146 lb 6.4 oz (66.4 kg)  04/01/20 154 lb 12.8 oz (70.2 kg)  12/29/19 149 lb 6.4 oz (67.8 kg)     Kidney Function Lab Results  Component Value Date/Time    CREATININE 1.73 (H) 05/28/2020 10:42 AM   CREATININE 1.69 (H) 04/11/2020 10:46 AM   CREATININE 2.13 (H) 12/29/2019 09:26 AM   CREATININE 1.86 (H) 12/30/2018 03:22 PM   CREATININE 1.2 06/12/2016 01:13 PM   CREATININE 1.4 (H) 11/08/2015 02:17 PM   GFR 36.50 (L) 05/28/2020 10:42 AM   GFRNONAA 37 (L) 04/11/2020 10:46 AM   GFRNONAA 31 (L) 12/29/2019 09:26 AM   GFRAA 43 (L) 04/11/2020 10:46 AM   GFRAA 39 (L) 12/30/2018 03:22 PM    BMP Latest Ref Rng & Units 05/28/2020 04/11/2020 04/01/2020  Glucose 70 - 99 mg/dL 119(H) 68 87  BUN 6 - 23 mg/dL 31(H) 23 18  Creatinine 0.40 - 1.50 mg/dL 1.73(H) 1.69(H) 1.47(H)  BUN/Creat Ratio 10 - 24 - 14 12  Sodium 135 - 145 mEq/L 138 141 140  Potassium 3.5 - 5.1 mEq/L 4.6 5.1 5.4(H)  Chloride 96 - 112 mEq/L 107 110(H) 107(H)  CO2 19 - 32 mEq/L '25 23 22  '$ Calcium 8.4 - 10.5 mg/dL 9.3 9.2 9.5    Current antihypertensive regimen:  Carvedilol 6.25 mg - 0.5 tablet twice daily Losartan 50 mg daily  How often are you checking your Blood Pressure? 1-2x per week  Current home BP readings: 119/73   What recent interventions/DTPs have been made by any provider to improve Blood  Pressure control since last CPP Visit: No interventions or DTP's.  Any recent hospitalizations or ED visits since last visit with CPP? No  What diet changes have been made to improve Blood Pressure Control?  Patient states he doesn't have much of an appetite.  What exercise is being done to improve your Blood Pressure Control?  Bowls twice a week, does yard work  Adherence Review: Is the patient currently on ACE/ARB medication? Yes Does the patient have >5 day gap between last estimated fill dates? No  Future Appointments  Date Time Provider [place]  11/28/2020 10:30 AM Leamon Arnt, MD [facility]  12/27/2020  1:00 PM [facility] None  12/27/2020  1:30 PM Wyatt Portela, MD [facility] None  03/17/2021 10:30 AM [facility] PHARMACIST [facility]       Star Rating Drugs: Rosuvastatin last filled 09/05/2020 90 DS Losartan - gets from [facility]  April D Calhoun, [place] Pharmacist Assistant [phone]   10 minutes spent in review, coordination, and documentation.  Reviewed by: Beverly Milch, PharmD Clinical Pharmacist [phone]

## 2020-10-21 DIAGNOSIS — E611 Iron deficiency: Secondary | ICD-10-CM | POA: Diagnosis not present

## 2020-10-21 DIAGNOSIS — N189 Chronic kidney disease, unspecified: Secondary | ICD-10-CM | POA: Diagnosis not present

## 2020-10-21 DIAGNOSIS — N1831 Chronic kidney disease, stage 3a: Secondary | ICD-10-CM | POA: Diagnosis not present

## 2020-10-21 DIAGNOSIS — I129 Hypertensive chronic kidney disease with stage 1 through stage 4 chronic kidney disease, or unspecified chronic kidney disease: Secondary | ICD-10-CM | POA: Diagnosis not present

## 2020-10-21 DIAGNOSIS — D472 Monoclonal gammopathy: Secondary | ICD-10-CM | POA: Diagnosis not present

## 2020-10-21 DIAGNOSIS — N179 Acute kidney failure, unspecified: Secondary | ICD-10-CM | POA: Diagnosis not present

## 2020-10-21 DIAGNOSIS — D631 Anemia in chronic kidney disease: Secondary | ICD-10-CM | POA: Diagnosis not present

## 2020-10-21 DIAGNOSIS — I429 Cardiomyopathy, unspecified: Secondary | ICD-10-CM | POA: Diagnosis not present

## 2020-11-28 ENCOUNTER — Ambulatory Visit (INDEPENDENT_AMBULATORY_CARE_PROVIDER_SITE_OTHER): Payer: Medicare Other | Admitting: Family Medicine

## 2020-11-28 ENCOUNTER — Other Ambulatory Visit: Payer: Self-pay

## 2020-11-28 ENCOUNTER — Encounter: Payer: Self-pay | Admitting: Family Medicine

## 2020-11-28 VITALS — BP 106/80 | HR 64 | Temp 98.1°F | Ht 72.0 in | Wt 141.4 lb

## 2020-11-28 DIAGNOSIS — N183 Chronic kidney disease, stage 3 unspecified: Secondary | ICD-10-CM | POA: Diagnosis not present

## 2020-11-28 DIAGNOSIS — I10 Essential (primary) hypertension: Secondary | ICD-10-CM | POA: Diagnosis not present

## 2020-11-28 DIAGNOSIS — Z23 Encounter for immunization: Secondary | ICD-10-CM | POA: Diagnosis not present

## 2020-11-28 DIAGNOSIS — I5022 Chronic systolic (congestive) heart failure: Secondary | ICD-10-CM | POA: Diagnosis not present

## 2020-11-28 DIAGNOSIS — I25118 Atherosclerotic heart disease of native coronary artery with other forms of angina pectoris: Secondary | ICD-10-CM

## 2020-11-28 DIAGNOSIS — E782 Mixed hyperlipidemia: Secondary | ICD-10-CM

## 2020-11-28 NOTE — Patient Instructions (Signed)
Please return in 6 months for your annual complete physical; please come fasting.   We gave you your flu shot today.   If you have any questions or concerns, please don't hesitate to send me a message via MyChart or call the office at [phone]. Thank you for visiting with [place] today! It's our pleasure caring for you.

## 2020-11-28 NOTE — Progress Notes (Signed)
Subjective  CC:  Chief Complaint  Patient presents with   Hyperlipidemia   Hypertension    HPI: Luis Chen is a 80 y.o. male who presents to the office today to address the problems listed above in the chief complaint. Hypertension f/u: Control is good . Pt reports he is doing well. taking medications as instructed, no medication side effects noted, no TIAs, no chest pain on exertion, no dyspnea on exertion, no swelling of ankles. He denies adverse effects from his BP medications. Compliance with medication is good.  HLD: tolerates statin CKD w/ anemia: feels well. Good energy. No nausea, pruritus, leg edema.  CAD/CHF: no cp. No sob  Assessment  1. Chronic systolic heart failure ([place])   2. Mixed hyperlipidemia   3. Benign essential hypertension   4. Stage 3 chronic kidney disease, unspecified whether stage 3a or 3b CKD ([place])   5. Coronary artery disease involving native coronary artery of native heart with other form of angina pectoris ([place])      Plan   Hypertension f/u: BP control is well controlled. Conitnue coreg, losartan and aldactone. Doses listed below Hyperlipidemia f/u: crestor 40. At goal CKD: no sxs. On ARB and lasix CHF: on arb bb and spironalactone. See doses below. No changes today. Stable . CAD: no angina. Aspirin and bb.  Flu shot today. Discussed getting shingrix.   Education regarding management of these chronic disease states was given. Management strategies discussed on successive visits include dietary and exercise recommendations, goals of achieving and maintaining IBW, and lifestyle modifications aiming for adequate sleep and minimizing stressors.  Outpatient Encounter Medications as of 11/28/2020  Medication Sig   acetic acid 2 % otic solution    aspirin 81 MG EC tablet Take 1 tablet (81 mg total) by mouth daily.   carvedilol (COREG) 6.25 MG tablet Take 0.5 tablets (3.125 mg total) by mouth 2 (two) times daily with a meal.   feeding supplement,  ENSURE ENLIVE, (ENSURE ENLIVE) LIQD Take 237 mLs by mouth 3 (three) times daily between meals.   Ferrous Gluconate 324 (37.5 Fe) MG TABS TAKE 1 TABLET BY MOUTH TWICE DAILY WITH MEALS   furosemide (LASIX) 20 MG tablet Take 20 mg by mouth daily.   latanoprost (XALATAN) 0.005 % ophthalmic solution Place 1 drop into both eyes at bedtime.    losartan (COZAAR) 50 MG tablet Take 1 tablet (50 mg total) by mouth daily.   rosuvastatin (CRESTOR) 40 MG tablet TAKE 1 TABLET BY MOUTH ONCE DAILY AT  6PM   traZODone (DESYREL) 150 MG tablet Take 150 mg by mouth at bedtime as needed for sleep.    ezetimibe (ZETIA) 10 MG tablet Take 1 tablet (10 mg total) by mouth daily.   spironolactone (ALDACTONE) 25 MG tablet Take 1 tablet (25 mg total) by mouth daily.   No facility-administered encounter medications on file as of 11/28/2020.    Follow up: No follow-ups on file.  No orders of the defined types were placed in this encounter.  No orders of the defined types were placed in this encounter.     BP Readings from Last 3 Encounters:  11/28/20 106/80  05/28/20 (!) 98/52  04/01/20 102/60   Wt Readings from Last 3 Encounters:  11/28/20 141 lb 6.4 oz (64.1 kg)  05/28/20 146 lb 6.4 oz (66.4 kg)  04/01/20 154 lb 12.8 oz (70.2 kg)    Lab Results  Component Value Date   CHOL 126 05/28/2020   CHOL 145 04/01/2020  CHOL 129 05/05/2019   Lab Results  Component Value Date   HDL 43.50 05/28/2020   HDL 59 04/01/2020   HDL 41.60 05/05/2019   Lab Results  Component Value Date   LDLCALC 68 05/28/2020   LDLCALC 73 04/01/2020   LDLCALC 75 05/05/2019   Lab Results  Component Value Date   TRIG 69.0 05/28/2020   TRIG 61 04/01/2020   TRIG 59.0 05/05/2019   Lab Results  Component Value Date   CHOLHDL 3 05/28/2020   CHOLHDL 2.5 04/01/2020   CHOLHDL 3 05/05/2019   No results found for: LDLDIRECT Lab Results  Component Value Date   CREATININE 1.73 (H) 05/28/2020   BUN 31 (H) 05/28/2020   NA 138  05/28/2020   K 4.6 05/28/2020   CL 107 05/28/2020   CO2 25 05/28/2020    The ASCVD Risk score (Arnett DK, et al., 2019) failed to calculate for the following reasons:   The 2019 ASCVD risk score is only valid for ages 16 to 42  I reviewed the patients updated PMH, FH, and SocHx.    Patient Active Problem List   Diagnosis Date Noted   S/P CABG x 3 02/17/2018   CAD (coronary artery disease) 38/18/2993   Systolic HF (heart failure) ([place]) 02/08/2018   Nonischemic cardiomyopathy ([place]) 07/03/2015   Anemia, chronic disease 07/01/2015   Primary osteoarthritis of knees, bilateral 12/10/2014   MGUS (monoclonal gammopathy of unknown significance) 06/30/2010   Chronic kidney disease (CKD), stage III (moderate) ([facility]CC) 03/24/2010   Benign essential hypertension 10/31/2008   Mixed hyperlipidemia 10/30/2008    Allergies: Ace inhibitors and Entresto [sacubitril-valsartan]  Social History: Patient  reports that he has never smoked. He has never used smokeless tobacco. He reports current alcohol use. He reports that he does not use drugs.  Current Meds  Medication Sig   acetic acid 2 % otic solution    aspirin 81 MG EC tablet Take 1 tablet (81 mg total) by mouth daily.   carvedilol (COREG) 6.25 MG tablet Take 0.5 tablets (3.125 mg total) by mouth 2 (two) times daily with a meal.   feeding supplement, ENSURE ENLIVE, (ENSURE ENLIVE) LIQD Take 237 mLs by mouth 3 (three) times daily between meals.   Ferrous Gluconate 324 (37.5 Fe) MG TABS TAKE 1 TABLET BY MOUTH TWICE DAILY WITH MEALS   furosemide (LASIX) 20 MG tablet Take 20 mg by mouth daily.   latanoprost (XALATAN) 0.005 % ophthalmic solution Place 1 drop into both eyes at bedtime.    losartan (COZAAR) 50 MG tablet Take 1 tablet (50 mg total) by mouth daily.   rosuvastatin (CRESTOR) 40 MG tablet TAKE 1 TABLET BY MOUTH ONCE DAILY AT  6PM   traZODone (DESYREL) 150 MG tablet Take 150 mg by mouth at bedtime as needed for sleep.     Review of  Systems: Cardiovascular: negative for chest pain, palpitations, leg swelling, orthopnea Respiratory: negative for SOB, wheezing or persistent cough Gastrointestinal: negative for abdominal pain Genitourinary: negative for dysuria or gross hematuria  Objective  Vitals: BP 106/80   Pulse 64   Temp 98.1 F (36.7 C) (Temporal)   Ht 6' (1.829 m)   Wt 141 lb 6.4 oz (64.1 kg)   SpO2 98%   BMI 19.18 kg/m  General: no acute distress  Psych:  Alert and oriented, normal mood and affect HEENT:  Normocephalic, atraumatic, supple neck  Cardiovascular:  RRR with soft systolic murmur. no edema Respiratory:  Good breath sounds bilaterally, CTAB with normal  respiratory effort Skin:  Warm, no rashes Neurologic:   Mental status is normal Commons side effects, risks, benefits, and alternatives for medications and treatment plan prescribed today were discussed, and the patient expressed understanding of the given instructions. Patient is instructed to call or message via MyChart if he/she has any questions or concerns regarding our treatment plan. No barriers to understanding were identified. We discussed Red Flag symptoms and signs in detail. Patient expressed understanding regarding what to do in case of urgent or emergency type symptoms.  Medication list was reconciled, printed and provided to the patient in AVS. Patient instructions and summary information was reviewed with the patient as documented in the AVS. This note was prepared with assistance of Dragon voice recognition software. Occasional wrong-word or sound-a-like substitutions may have occurred due to the inherent limitations of voice recognition software  This visit occurred during the SARS-CoV-2 public health emergency.  Safety protocols were in place, including screening questions prior to the visit, additional usage of staff PPE, and extensive cleaning of exam room while observing appropriate contact time as indicated for disinfecting  solutions.

## 2020-12-27 ENCOUNTER — Other Ambulatory Visit: Payer: Self-pay

## 2020-12-27 ENCOUNTER — Inpatient Hospital Stay (HOSPITAL_BASED_OUTPATIENT_CLINIC_OR_DEPARTMENT_OTHER): Payer: Medicare Other | Admitting: Oncology

## 2020-12-27 ENCOUNTER — Inpatient Hospital Stay: Payer: Medicare Other | Attending: Oncology

## 2020-12-27 VITALS — BP 102/55 | HR 79 | Temp 97.8°F | Resp 16 | Ht 72.0 in | Wt 145.8 lb

## 2020-12-27 DIAGNOSIS — Z79899 Other long term (current) drug therapy: Secondary | ICD-10-CM | POA: Insufficient documentation

## 2020-12-27 DIAGNOSIS — N189 Chronic kidney disease, unspecified: Secondary | ICD-10-CM | POA: Diagnosis not present

## 2020-12-27 DIAGNOSIS — D638 Anemia in other chronic diseases classified elsewhere: Secondary | ICD-10-CM

## 2020-12-27 DIAGNOSIS — D472 Monoclonal gammopathy: Secondary | ICD-10-CM | POA: Insufficient documentation

## 2020-12-27 DIAGNOSIS — D631 Anemia in chronic kidney disease: Secondary | ICD-10-CM | POA: Diagnosis not present

## 2020-12-27 LAB — CBC WITH DIFFERENTIAL (CANCER CENTER ONLY)
Abs Immature Granulocytes: 0.01 10*3/uL (ref 0.00–0.07)
Basophils Absolute: 0 10*3/uL (ref 0.0–0.1)
Basophils Relative: 1 %
Eosinophils Absolute: 0.3 10*3/uL (ref 0.0–0.5)
Eosinophils Relative: 6 %
HCT: 29.6 % — ABNORMAL LOW (ref 39.0–52.0)
Hemoglobin: 10.1 g/dL — ABNORMAL LOW (ref 13.0–17.0)
Immature Granulocytes: 0 %
Lymphocytes Relative: 37 %
Lymphs Abs: 2 10*3/uL (ref 0.7–4.0)
MCH: 32 pg (ref 26.0–34.0)
MCHC: 34.1 g/dL (ref 30.0–36.0)
MCV: 93.7 fL (ref 80.0–100.0)
Monocytes Absolute: 0.5 10*3/uL (ref 0.1–1.0)
Monocytes Relative: 8 %
Neutro Abs: 2.7 10*3/uL (ref 1.7–7.7)
Neutrophils Relative %: 48 %
Platelet Count: 194 10*3/uL (ref 150–400)
RBC: 3.16 MIL/uL — ABNORMAL LOW (ref 4.22–5.81)
RDW: 14.2 % (ref 11.5–15.5)
WBC Count: 5.6 10*3/uL (ref 4.0–10.5)
nRBC: 0 % (ref 0.0–0.2)

## 2020-12-27 LAB — IRON AND TIBC
Iron: 90 ug/dL (ref 45–182)
Saturation Ratios: 29 % (ref 17.9–39.5)
TIBC: 307 ug/dL (ref 250–450)
UIBC: 217 ug/dL

## 2020-12-27 LAB — CMP (CANCER CENTER ONLY)
ALT: 24 U/L (ref 0–44)
AST: 25 U/L (ref 15–41)
Albumin: 3.7 g/dL (ref 3.5–5.0)
Alkaline Phosphatase: 74 U/L (ref 38–126)
Anion gap: 7 (ref 5–15)
BUN: 30 mg/dL — ABNORMAL HIGH (ref 8–23)
CO2: 22 mmol/L (ref 22–32)
Calcium: 9.3 mg/dL (ref 8.9–10.3)
Chloride: 110 mmol/L (ref 98–111)
Creatinine: 1.8 mg/dL — ABNORMAL HIGH (ref 0.61–1.24)
GFR, Estimated: 37 mL/min — ABNORMAL LOW (ref >60)
Glucose, Bld: 106 mg/dL — ABNORMAL HIGH (ref 70–99)
Potassium: 5.1 mmol/L (ref 3.5–5.1)
Sodium: 139 mmol/L (ref 135–145)
Total Bilirubin: 0.6 mg/dL (ref 0.3–1.2)
Total Protein: 7.5 g/dL (ref 6.5–8.1)

## 2020-12-27 LAB — FERRITIN: Ferritin: 305 ng/mL (ref 24–336)

## 2020-12-27 NOTE — Progress Notes (Signed)
Hematology and Oncology Follow Up Visit  Luis Chen [id number] [birth year] 80 y.o. 12/27/2020 1:25 PM  CC: [name], MD  [name], M.D.  [name], MD, [facility]    Principle Diagnosis:  80 year old man with:  1.  IgG MGUS presented with an M spike of 0.2 g/dL 2012.      2.  Multifactorial anemia related to chronic renal disease and iron deficiency diagnosed in 2012.    Current therapy: Oral iron supplement daily.   Interim History: Luis Chen returns today for a follow-up visit.  Since her last visit, he reports feeling well without any major complaints.  He denies any nausea, vomiting or abdominal pain.  He denies any recent hospitalizations or illnesses.  He denies any pathological fractures or excessive fatigue.    Medications: Updated on review.    aspirin EC 325 MG EC tablet, Take 1 tablet (325 mg total) by mouth daily., Disp: 30 tablet, Rfl: 0   carvedilol (COREG) 6.25 MG tablet, Take 1 tablet (6.25 mg total) by mouth 2 (two) times daily with a meal., Disp: 180 tablet, Rfl: 3   ezetimibe (ZETIA) 10 MG tablet, Take 1 tablet (10 mg total) by mouth daily., Disp: 30 tablet, Rfl: 11   feeding supplement, ENSURE ENLIVE, (ENSURE ENLIVE) LIQD, Take 237 mLs by mouth 3 (three) times daily between meals., Disp: 237 mL, Rfl: 12   Ferrous Gluconate 324 (37.5 Fe) MG TABS, TAKE 1 TABLET BY MOUTH TWICE DAILY WITH MEALS, Disp: 60 tablet, Rfl: 1   latanoprost (XALATAN) 0.005 % ophthalmic solution, Place 1 drop into both eyes at bedtime. , Disp: , Rfl:    losartan (COZAAR) 50 MG tablet, Take 1 tablet (50 mg total) by mouth daily., Disp: 90 tablet, Rfl: 3   rosuvastatin (CRESTOR) 40 MG tablet, TAKE 1 TABLET BY MOUTH ONCE DAILY AT  6PM, Disp: 90 tablet, Rfl: 3   spironolactone (ALDACTONE) 25 MG tablet, Take 1 tablet (25 mg total) by mouth daily., Disp: 90 tablet, Rfl: 3   traZODone (DESYREL) 150 MG tablet, Take 100 mg by mouth at bedtime as needed for sleep. , Disp: , Rfl:    Allergies:  Allergies  Allergen Reactions   Ace Inhibitors Swelling    Facial swelling    Entresto [Sacubitril-Valsartan] Swelling    Angioedema with ACE Inhibitor       Physical Exam:      ECOG: 0    General appearance: Comfortable appearing without any discomfort Head: Normocephalic without any trauma Oropharynx: Mucous membranes are moist and pink without any thrush or ulcers. Eyes: Pupils are equal and round reactive to light. Lymph nodes: No cervical, supraclavicular, inguinal or axillary lymphadenopathy.   Heart:regular rate and rhythm.  S1 and S2 without leg edema. Lung: Clear without any rhonchi or wheezes.  No dullness to percussion. Abdomin: Soft, nontender, nondistended with good bowel sounds.  No hepatosplenomegaly. Musculoskeletal: No joint deformity or effusion.  Full range of motion noted. Neurological: No deficits noted on motor, sensory and deep tendon reflex exam. Skin: No petechial rash or dryness.  Appeared moist.        Lab Results: Lab Results  Component Value Date   WBC 4.6 05/28/2020   HGB 10.8 (L) 05/28/2020   HCT 31.3 (L) 05/28/2020   MCV 94.3 05/28/2020   PLT 171.0 05/28/2020     Chemistry      Component Value Date/Time   NA 138 05/28/2020 1042   NA 141 04/11/2020 1046   NA 139 06/12/2016  1313   K 4.6 05/28/2020 1042   K 4.4 06/12/2016 1313   CL 107 05/28/2020 1042   CO2 25 05/28/2020 1042   CO2 25 06/12/2016 1313   BUN 31 (H) 05/28/2020 1042   BUN 23 04/11/2020 1046   BUN 17.9 06/12/2016 1313   CREATININE 1.73 (H) 05/28/2020 1042   CREATININE 2.13 (H) 12/29/2019 0926   CREATININE 1.2 06/12/2016 1313   GLU 92 09/14/2018 0000      Component Value Date/Time   CALCIUM 9.3 05/28/2020 1042   CALCIUM 9.3 06/12/2016 1313   ALKPHOS 80 05/28/2020 1042   ALKPHOS 98 06/12/2016 1313   AST 26 05/28/2020 1042   AST 20 12/29/2019 0926   AST 18 06/12/2016 1313   ALT 20 05/28/2020 1042   ALT 17 12/29/2019 0926   ALT 16  06/12/2016 1313   BILITOT 0.5 05/28/2020 1042   BILITOT 0.3 04/01/2020 0941   BILITOT 0.4 12/29/2019 0926   BILITOT 0.44 06/12/2016 1313        Results for Luis Chen, Luis Chen (MRN [id number]) as of 12/27/2020 13:34  Ref. Range 12/30/2018 15:22 12/29/2019 09:27  M Protein SerPl Elph-Mcnc Latest Ref Range: Not Observed g/dL 0.3 (H) 0.2 (H)  IFE 1 Unknown Comment (A) Comment (A)  Globulin, Total Latest Ref Range: 2.2 - 3.9 g/dL 3.0 3.4  B-Globulin SerPl Elph-Mcnc Latest Ref Range: 0.7 - 1.3 g/dL 0.9 1.0  IgG (Immunoglobin G), Serum Latest Ref Range: 603 - 1,613 mg/dL 1,397 1,339  IgM (Immunoglobulin M), Srm Latest Ref Range: 15 - 143 mg/dL 162 (H) 178 (H)  IgA Latest Ref Range: 61 - 437 mg/dL 141 159    Results for Luis Chen, Luis Chen (MRN [id number]) as of 12/27/2020 13:34  Ref. Range 12/29/2019 09:27 05/28/2020 10:42  Iron Latest Ref Range: 50 - 180 mcg/dL 52 71  UIBC Latest Ref Range: 117 - 376 ug/dL 221   TIBC Latest Ref Range: 250 - 425 mcg/dL (calc) 274 240 (L)  %SAT Latest Ref Range: 20 - 48 % (calc)  30  Saturation Ratios Latest Ref Range: 20 - 55 % 19 (L)   Ferritin Latest Ref Range: 24 - 380 ng/mL 487 (H) 331    Assessment and plan:  80 year old man with the following:  1.  Monoclonal gammopathy diagnosed in 2012.  She was found to have IgG subtype without any evidence of multiple myeloma or endorgan damage.  Protein studies in the last 10 years were personally reviewed and continues to show very low M spike at 0.2 g/dL with normal IgG level.  The natural course of this disease and risk of progressing into symptomatic multiple myeloma were discussed.  At this time the risk remains low we will continue annual surveillance.    2.  Multifactorial anemia: Due to chronic renal insufficiency and iron deficiency.  His hemoglobin is range between 10 and 11 and does not require any intervention.  Iron studies in March 2022 were within normal range.  Growth factor support could be  considered in the future if he has worsening anemia.    3.  Chronic renal insufficiency: Kidney function remained stable and unrelated to plasma cell disorder.  4.  Follow-up: He will return in 1 year for repeat follow-up.   30  minutes were spent on this encounter.  The time was dedicated to reviewing laboratory data, status update and outlining future plan of care.   Zola Button, MD 10/21/20221:25 PM

## 2020-12-30 LAB — KAPPA/LAMBDA LIGHT CHAINS
Kappa free light chain: 82.5 mg/L — ABNORMAL HIGH (ref 3.3–19.4)
Kappa, lambda light chain ratio: 3.57 — ABNORMAL HIGH (ref 0.26–1.65)
Lambda free light chains: 23.1 mg/L (ref 5.7–26.3)

## 2020-12-31 LAB — MULTIPLE MYELOMA PANEL, SERUM
Albumin SerPl Elph-Mcnc: 3.5 g/dL (ref 2.9–4.4)
Albumin/Glob SerPl: 1.1 (ref 0.7–1.7)
Alpha 1: 0.2 g/dL (ref 0.0–0.4)
Alpha2 Glob SerPl Elph-Mcnc: 0.7 g/dL (ref 0.4–1.0)
B-Globulin SerPl Elph-Mcnc: 0.9 g/dL (ref 0.7–1.3)
Gamma Glob SerPl Elph-Mcnc: 1.4 g/dL (ref 0.4–1.8)
Globulin, Total: 3.2 g/dL (ref 2.2–3.9)
IgA: 163 mg/dL (ref 61–437)
IgG (Immunoglobin G), Serum: 1395 mg/dL (ref 603–1613)
IgM (Immunoglobulin M), Srm: 158 mg/dL — ABNORMAL HIGH (ref 15–143)
M Protein SerPl Elph-Mcnc: 0.3 g/dL — ABNORMAL HIGH
Total Protein ELP: 6.7 g/dL (ref 6.0–8.5)

## 2021-02-10 DIAGNOSIS — E611 Iron deficiency: Secondary | ICD-10-CM | POA: Diagnosis not present

## 2021-02-10 DIAGNOSIS — I429 Cardiomyopathy, unspecified: Secondary | ICD-10-CM | POA: Diagnosis not present

## 2021-02-10 DIAGNOSIS — D631 Anemia in chronic kidney disease: Secondary | ICD-10-CM | POA: Diagnosis not present

## 2021-02-10 DIAGNOSIS — N1831 Chronic kidney disease, stage 3a: Secondary | ICD-10-CM | POA: Diagnosis not present

## 2021-02-10 DIAGNOSIS — I129 Hypertensive chronic kidney disease with stage 1 through stage 4 chronic kidney disease, or unspecified chronic kidney disease: Secondary | ICD-10-CM | POA: Diagnosis not present

## 2021-02-10 DIAGNOSIS — D472 Monoclonal gammopathy: Secondary | ICD-10-CM | POA: Diagnosis not present

## 2021-02-10 DIAGNOSIS — N179 Acute kidney failure, unspecified: Secondary | ICD-10-CM | POA: Diagnosis not present

## 2021-03-12 NOTE — Progress Notes (Deleted)
Chronic Care Management Pharmacy Note  03/12/2021 Name:  Luis Chen MRN:  [id number] DOB:  June 17, 1940  Summary: ***  Recommendations/Changes made from today's visit: ***  Plan: ***   Subjective: Luis Chen is an 81 y.o. year old male who is a primary patient of Leamon Arnt, MD.  The CCM team was consulted for assistance with disease management and care coordination needs.    Engaged with patient by telephone for follow up visit in response to provider referral for pharmacy case management and/or care coordination services.   Consent to Services:  The patient was given the following information about Chronic Care Management services today, agreed to services, and gave verbal consent: 1. CCM service includes personalized support from designated clinical staff supervised by the primary care provider, including individualized plan of care and coordination with other care providers 2. 24/7 contact phone numbers for assistance for urgent and routine care needs. 3. Service will only be billed when office clinical staff spend 20 minutes or more in a month to coordinate care. 4. Only one practitioner may furnish and bill the service in a calendar month. 5.The patient may stop CCM services at any time (effective at the end of the month) by phone call to the office staff. 6. The patient will be responsible for cost sharing (co-pay) of up to 20% of the service fee (after annual deductible is met). Patient agreed to services and consent obtained.  Patient Care Team: Leamon Arnt, MD as PCP - General (Family Medicine) Larey Dresser, MD as PCP - Advanced Heart Failure (Cardiology) [name], MD as PCP - Cardiology (Cardiology) Renette Butters, MD as Attending Physician (Orthopedic Surgery) [name], MD as Consulting Physician (Nephrology) Caryl Pina, MD (Hematology) [name], MD as Consulting Physician (Cardiology) [facility] Willey, Mathis Dad,  MD as Consulting Physician (Oncology) [facility], [facility] as Consulting Physician Madelin Rear, [facility] as Pharmacist (Pharmacist)  Recent office visits:  None   Recent consult visits:  None   Hospital visits:  None in previous 6 months  Objective:  Lab Results  Component Value Date   CREATININE 1.80 (H) 12/27/2020   BUN 30 (H) 12/27/2020   GFR 36.50 (L) 05/28/2020   GFRNONAA 37 (L) 12/27/2020   GFRAA 43 (L) 04/11/2020   NA 139 12/27/2020   K 5.1 12/27/2020   CALCIUM 9.3 12/27/2020   CO2 22 12/27/2020   GLUCOSE 106 (H) 12/27/2020    Lab Results  Component Value Date/Time   HGBA1C 5.7 (H) 02/16/2018 11:18 AM   HGBA1C  10/19/2008 05:25 AM    6.1 (NOTE) The ADA recommends the following therapeutic goal for glycemic control related to Hgb A1c measurement: Goal of therapy: <6.5 Hgb A1c  Reference: American Diabetes Association: Clinical Practice Recommendations 2010, Diabetes Care, 2010, 33: (Suppl  1).   GFR 36.50 (L) 05/28/2020 10:42 AM   GFR 52.37 (L) 05/05/2019 08:49 AM    Last diabetic Eye exam: No results found for: HMDIABEYEEXA  Last diabetic Foot exam: No results found for: HMDIABFOOTEX   Lab Results  Component Value Date   CHOL 126 05/28/2020   HDL 43.50 05/28/2020   LDLCALC 68 05/28/2020   TRIG 69.0 05/28/2020   CHOLHDL 3 05/28/2020    Hepatic Function Latest Ref Rng & Units 12/27/2020 05/28/2020 04/01/2020  Total Protein 6.5 - 8.1 g/dL 7.5 6.5 6.9  Albumin 3.5 - 5.0 g/dL 3.7 3.8 4.2  AST 15 - 41 U/L 25 26 27  ALT 0 - 44 U/L '24 20 21  ' Alk Phosphatase 38 - 126 U/L 74 80 98  Total Bilirubin 0.3 - 1.2 mg/dL 0.6 0.5 0.3    Lab Results  Component Value Date/Time   TSH 1.00 05/28/2020 10:42 AM   TSH 2.02 05/05/2019 08:49 AM   FREET4 1.28 10/19/2008 01:15 PM    CBC Latest Ref Rng & Units 12/27/2020 05/28/2020 04/01/2020  WBC 4.0 - 10.5 K/uL 5.6 4.6 5.1  Hemoglobin 13.0 - 17.0 g/dL 10.1(L) 10.8(L) 10.9(L)  Hematocrit 39.0 - 52.0 % 29.6(L) 31.3(L) 34.2(L)   Platelets 150 - 400 K/uL 194 171.0 187    No results found for: VD25OH  Clinical ASCVD: {YES/NO:21197} The ASCVD Risk score (Arnett DK, et al., 2019) failed to calculate for the following reasons:   The 2019 ASCVD risk score is only valid for ages 72 to 77    Depression screen PHQ 2/9 05/28/2020 01/16/2019 05/02/2018  Decreased Interest 0 0 0  Down, Depressed, Hopeless 0 0 0  PHQ - 2 Score 0 0 0     ***Other: (CHADS2VASc if Afib, MMRC or CAT for COPD, ACT, DEXA)  Social History   Tobacco Use  Smoking Status Never  Smokeless Tobacco Never   BP Readings from Last 3 Encounters:  12/27/20 (!) 102/55  11/28/20 106/80  05/28/20 (!) 98/52   Pulse Readings from Last 3 Encounters:  12/27/20 79  11/28/20 64  05/28/20 (!) 57   Wt Readings from Last 3 Encounters:  12/27/20 145 lb 12.8 oz (66.1 kg)  11/28/20 141 lb 6.4 oz (64.1 kg)  05/28/20 146 lb 6.4 oz (66.4 kg)   BMI Readings from Last 3 Encounters:  12/27/20 19.77 kg/m  11/28/20 19.18 kg/m  05/28/20 19.86 kg/m    Assessment/Interventions: Review of patient past medical history, allergies, medications, health status, including review of consultants reports, laboratory and other test data, was performed as part of comprehensive evaluation and provision of chronic care management services.   SDOH:  (Social Determinants of Health) assessments and interventions performed: {yes/no:20286}  SDOH Screenings   Alcohol Screen: Not on file  Depression (PHQ2-9): Low Risk    PHQ-2 Score: 0  Financial Resource Strain: Not on file  Food Insecurity: Not on file  Housing: Not on file  Physical Activity: Not on file  Social Connections: Not on file  Stress: Not on file  Tobacco Use: Low Risk    Smoking Tobacco Use: Never   Smokeless Tobacco Use: Never   Passive Exposure: Not on file  Transportation Needs: Not on file    [place]  Allergies  Allergen Reactions   Ace Inhibitors Swelling    Facial swelling    Entresto  [Sacubitril-Valsartan] Swelling    Angioedema with ACE Inhibitor    Medications Reviewed Today     Reviewed by Gean Birchwood, CMA (Certified Medical Assistant) on 11/28/20 at [place] Status: <None>   Medication Order Taking? Sig Documenting Provider Last Dose Status Informant  acetic acid 2 % otic solution [id number] Yes  [provider] Taking Active   aspirin 81 MG EC tablet [id number] Yes Take 1 tablet (81 mg total) by mouth daily. Almyra Deforest, [place] Taking Active   carvedilol (COREG) 6.25 MG tablet [id number] Yes Take 0.5 tablets (3.125 mg total) by mouth 2 (two) times daily with a meal. Leamon Arnt, MD Taking Active   ezetimibe (ZETIA) 10 MG tablet [id number]  Take 1 tablet (10 mg total) by mouth daily. Aundra Dubin,  Elby Showers, MD  Expired 12/05/19 2359   feeding supplement, ENSURE ENLIVE, (ENSURE ENLIVE) LIQD [id number] Yes Take 237 mLs by mouth 3 (three) times daily between meals. Lavina Hamman, MD Taking Active Self  Ferrous Gluconate 324 (37.5 Fe) MG TABS [id number] Yes TAKE 1 TABLET BY MOUTH TWICE DAILY WITH MEALS Shadad, Mathis Dad, MD Taking Active   furosemide (LASIX) 20 MG tablet [id number] Yes Take 20 mg by mouth daily. [provider] Taking Active Self  latanoprost (XALATAN) 0.005 % ophthalmic solution [id number] Yes Place 1 drop into both eyes at bedtime.  [provider] Taking Active Self  losartan (COZAAR) 50 MG tablet [id number] Yes Take 1 tablet (50 mg total) by mouth daily. Leamon Arnt, MD Taking Active   rosuvastatin (CRESTOR) 40 MG tablet [id number] Yes TAKE 1 TABLET BY MOUTH ONCE DAILY AT  Felicity Pellegrini, MD Taking Active   spironolactone (ALDACTONE) 25 MG tablet [id number]  Take 1 tablet (25 mg total) by mouth daily. Larey Dresser, MD  Expired 03/06/19 2359   traZODone (DESYREL) 150 MG tablet [id number] Yes Take 150 mg by mouth at bedtime as needed for sleep.  [provider] Taking Active Self            Patient Active  Problem List   Diagnosis Date Noted   S/P CABG x 3 02/17/2018   CAD (coronary artery disease) 54/65/0354   Systolic HF (heart failure) ([place]) 02/08/2018   Nonischemic cardiomyopathy ([place]) 07/03/2015   Anemia, chronic disease 07/01/2015   Primary osteoarthritis of knees, bilateral 12/10/2014   MGUS (monoclonal gammopathy of unknown significance) 06/30/2010   Chronic kidney disease (CKD), stage III (moderate) ([place]) 03/24/2010   Benign essential hypertension 10/31/2008   Mixed hyperlipidemia 10/30/2008    Immunization History  Administered Date(s) Administered   Fluad Quad(high Dose 65+) 10/31/2018, 11/28/2020   Influenza Split 11/03/2010   Influenza, High Dose Seasonal PF 11/30/2011, 12/06/2013, 12/10/2014, 12/20/2015, 02/10/2018   Influenza,inj,Quad PF,6+ Mos 12/05/2012   Influenza-Unspecified 01/31/2017   Moderna Sars-Covid-2 Vaccination 04/20/2019   Pneumococcal Conjugate-13 12/06/2013   Pneumococcal Polysaccharide-23 03/09/2008   Tdap 12/03/2008   Zoster, Live 03/09/2008    Conditions to be addressed/monitored:  HTN, CAD, Systolic HF, HLD  There are no care plans that you recently modified to display for this patient.    Medication Assistance: {MEDASSISTANCEINFO:25044}  Compliance/Adherence/Medication fill history: Care Gaps: ***  Star-Rating Drugs: ***  Patient's preferred pharmacy is:  [facility], [place]. [place] [address] Phone: [phone] Fax: [phone]  Uses pill box? {Yes or If no, why not?:20788} Pt endorses ***% compliance  We discussed: {Pharmacy options:24294} Patient decided to: {US Pharmacy Plan:23885}  Care Plan and Follow Up Patient Decision:  {FOLLOWUP:24991}  Plan: {CM FOLLOW UP BWGY:65993}  ***  Current Barriers:  {pharmacybarriers:24917}  Pharmacist Clinical Goal(s):  Patient will {PHARMACYGOALCHOICES:24921} through collaboration with PharmD and provider.    Interventions: 1:1 collaboration with Leamon Arnt, MD regarding development and update of comprehensive plan of care as evidenced by provider attestation and co-signature Inter-disciplinary care team collaboration (see longitudinal plan of care) Comprehensive medication review performed; medication list updated in electronic medical record  Hypertension (BP goal {CHL HP UPSTREAM Pharmacist BP ranges:[phone]}) -{US controlled/uncontrolled:25276} -Current treatment: *** -Medications previously tried: ***  -Current home readings: *** -Current dietary habits: *** -Current exercise habits: *** -{ACTIONS;DENIES/REPORTS:21021675::"Denies"} hypotensive/hypertensive symptoms -Educated on {CCM BP Counseling:25124} -Counseled to monitor BP at home ***, document, and provide  log at future appointments -{CCMPHARMDINTERVENTION:25122}  Hyperlipidemia/CAD: (LDL goal < 70) -{US controlled/uncontrolled:25276} -Current treatment: *** -Medications previously tried: ***  -Current dietary patterns: *** -Current exercise habits: *** -Educated on {CCM HLD Counseling:25126} -{CCMPHARMDINTERVENTION:25122}  Heart Failure (Goal: manage symptoms and prevent exacerbations) -{US controlled/uncontrolled:25276} -Last ejection fraction: *** (Date: ***) -HF type: {type of heart failure:30421350} -NYHA Class: {CHL HP Upstream Pharm NYHA [phone]} -AHA HF Stage: {CHL HP Upstream Pharm AHA HF Stage:[phone]} -Current treatment: *** -Medications previously tried: ***  -Current home BP/HR readings: *** -Current dietary habits: *** -Current exercise habits: *** -Educated on {CCM HF Counseling:25125} -{CCMPHARMDINTERVENTION:25122}  Patient Goals/Self-Care Activities Patient will:  - {pharmacypatientgoals:24919}  Follow Up Plan: {CM FOLLOW UP HMCN:47096}

## 2021-03-17 ENCOUNTER — Telehealth: Payer: Medicare Other

## 2021-03-20 DIAGNOSIS — I129 Hypertensive chronic kidney disease with stage 1 through stage 4 chronic kidney disease, or unspecified chronic kidney disease: Secondary | ICD-10-CM | POA: Diagnosis not present

## 2021-03-20 DIAGNOSIS — N1831 Chronic kidney disease, stage 3a: Secondary | ICD-10-CM | POA: Diagnosis not present

## 2021-05-09 ENCOUNTER — Telehealth: Payer: Self-pay | Admitting: Family Medicine

## 2021-05-09 NOTE — Telephone Encounter (Signed)
Pt cld requesting a call back from Dr. Jonni Sanger. Patient wants to know what over the counter medication he can take for Sinus Issues/Runny Nose.  ?

## 2021-05-09 NOTE — Telephone Encounter (Signed)
Spoke to pt told him per Dr. Jonni Sanger, can take Coricidin bp and/or mucinex dm if coughing. Pt verbalized understanding. ?

## 2021-05-09 NOTE — Telephone Encounter (Signed)
Please see message and advise 

## 2021-05-25 NOTE — Progress Notes (Signed)
?  ?Cardiology Office Note ? ? ?Date:  05/26/2021  ? ?ID:  Luis Chen, DOB [birth year], MRN [id number] ? ?PCP:  Leamon Arnt, MD  ?Cardiologist:   [name], MD ? ? ?Chief Complaint  ?Patient presents with  ? Coronary Artery Disease  ? ? ?  ?History of Present Illness: ?Luis Chen is a 81 y.o. male who presents for follow up of systolic HF with improved EF from 25% in 2010 -> 50% 2011 due to NICM, nonobstructive CAD, CKD, HL, and HTN.   He had cardiac cath on 02/2018 with severe multivessel disease.  He had CABG in Dec.  His EF on MRI was 22%.  He subsequently underwent CABG with SVG-LAD (LIMA inadequate as a graft) and sequential SVG-OM1/OM2.  He has a history of angioedema on he was on Entresto in the past, this was discontinued due to as he had history of angioedema with ACE inhibitor.  Last echocardiogram performed on 01/18/2019 showed EF 50 to 55%.   ? ?Since I last saw him he had a stress test at the [place].  I reviewed these results.  This was on May 08, 2021.  There was a subtle apical reversibility possibly apical thinning with minimal ischemia.  The EF was calculated to be 45%.  He exercised for 6 minutes and 20 seconds.  He stopped because of fatigue.  He achieved 84% of his age-predicted maximum.  This was thought to be low to intermediate risk.    The patient denies any new symptoms such as chest discomfort, neck or arm discomfort. There has been no new shortness of breath, PND or orthopnea. There have been no reported palpitations, presyncope or syncope. He apparently had this as a routine.  He still bowls twice a week and has a stationary bicycle.  He is a Psychologist, occupational at the [employer] ? ?Of note he says that his nephrologist took him off of his Cozaar because his creatinine was up.  I see the most recent I think is 1.79.  But the other reason was because his blood pressure was apparently running low. ? ?Past Medical History:  ?Diagnosis Date  ? Anemia, chronic disease 07/01/2015  ?  Angina decubitus ([place])   ? Benign essential hypertension 10/31/2008  ? Qualifier: Diagnosis of  By: Percival Spanish, MD, Farrel Gordon    ? CAD (coronary artery disease)   ? non obstructive. Left main normal. LAd proximal long 25% stenosis, termingating as focal 50% prox to mid lesion. First & second diag were small, normal. Circumflex in proximal av groove had luminal irregularities.. Was large mid obtuse marg which was branching, scattered luminal irregul. Infer branch did supply some septal perforators. Was PDA,small & normal.  < 1.76m. There was prox 95% stenosis  ? CARDIOMYOPATHY 11/27/2008  ? CKD (chronic kidney disease)   ? Hyperlipidemia   ? transient history, controlled on diet  ? Hypertension   ? Mixed hyperlipidemia 10/30/2008  ? Qualifier: Diagnosis of  By: TLovette ClicheCNA, [name]    ? Non-traumatic rhabdomyolysis 07/03/2015  ? Polyarthropathy 07/01/2015  ? Primary osteoarthritis of knees, bilateral 12/10/2014  ? Pyogenic arthritis of left knee joint ([place]   ? Swelling of left knee joint 07/01/2015  ? Systolic HF (heart failure) ([place] 02/08/2018  ? ? ?Past Surgical History:  ?Procedure Laterality Date  ? CORONARY ARTERY BYPASS GRAFT N/A 02/17/2018  ? Procedure: CORONARY ARTERY BYPASS GRAFTING (CABG);  Surgeon: BGaye Pollack MD;  [place] [facility]  Service: Open Heart Surgery;  Laterality: N/A;  Times 3 using endoscopically harvested right saphenous vein.    ? KNEE ARTHROSCOPY Left 07/01/2015  ? Procedure: Irrigation and debridement left knee arthroscopy;  Surgeon: Renette Butters, MD;  Location: [facility];  Service: Orthopedics;  Laterality: Left;  ? RIGHT/LEFT HEART CATH AND CORONARY ANGIOGRAPHY N/A 02/08/2018  ? Procedure: RIGHT/LEFT HEART CATH AND CORONARY ANGIOGRAPHY;  Surgeon: Jettie Booze, MD;  Location: [facility];  Service: Cardiovascular;  Laterality: N/A;  ? TEE WITHOUT CARDIOVERSION N/A 02/17/2018  ? Procedure: TRANSESOPHAGEAL ECHOCARDIOGRAM (TEE);  Surgeon: [name], MD;  Location: [facility];   Service: Open Heart Surgery;  Laterality: N/A;  ? ? ? ?Current Outpatient Medications  ?Medication Sig Dispense Refill  ? acetic acid 2 % otic solution     ? aspirin 81 MG EC tablet Take 1 tablet (81 mg total) by mouth daily.    ? carvedilol (COREG) 6.25 MG tablet Take 0.5 tablets (3.125 mg total) by mouth 2 (two) times daily with a meal.    ? ezetimibe (ZETIA) 10 MG tablet Take 1 tablet (10 mg total) by mouth daily. 30 tablet 11  ? feeding supplement, ENSURE ENLIVE, (ENSURE ENLIVE) LIQD Take 237 mLs by mouth 3 (three) times daily between meals. 237 mL 12  ? Ferrous Gluconate 324 (37.5 Fe) MG TABS TAKE 1 TABLET BY MOUTH TWICE DAILY WITH MEALS 60 tablet 1  ? furosemide (LASIX) 20 MG tablet Take 20 mg by mouth daily.    ? latanoprost (XALATAN) 0.005 % ophthalmic solution Place 1 drop into both eyes daily.    ? rosuvastatin (CRESTOR) 40 MG tablet TAKE 1 TABLET BY MOUTH ONCE DAILY AT  6PM 90 tablet 3  ? spironolactone (ALDACTONE) 25 MG tablet Take 1 tablet (25 mg total) by mouth daily. 90 tablet 3  ? traZODone (DESYREL) 150 MG tablet Take 150 mg by mouth at bedtime as needed for sleep.     ? ?No current facility-administered medications for this visit.  ? ? ?Allergies:   Ace inhibitors and Entresto [sacubitril-valsartan]  ? ? ?ROS:  Please see the history of present illness.   Otherwise, review of systems are positive for none.   All other systems are reviewed and negative.  ? ? ?PHYSICAL EXAM: ?VS:  BP 106/60   Pulse 65   Ht '5\' 10"'$  (1.778 m)   Wt 149 lb 9.6 oz (67.9 kg)   SpO2 99%   BMI 21.47 kg/m?  , BMI Body mass index is 21.47 kg/m?. ?GENERAL:  Well appearing ?NECK:  No jugular venous distention, waveform within normal limits, carotid upstroke brisk and symmetric, no bruits, no thyromegaly ?LUNGS:  Clear to auscultation bilaterally ?CHEST:  Well healed sternotomy scar. ?HEART:  PMI not displaced or sustained,S1 and S2 within normal limits, no S3, no S4, no clicks, no rubs, no murmurs ?ABD:  Flat, positive bowel  sounds normal in frequency in pitch, no bruits, no rebound, no guarding, no midline pulsatile mass, no hepatomegaly, no splenomegaly ?EXT:  2 plus pulses throughout, no edema, no cyanosis no clubbing ? ? ?EKG:  EKG is  ordered today. ?The ekg ordered today demonstrates sinus rhythm, rate 65, LAD, intervals within normal limits, nonspecific lateral T wave inversions. ? ? ?Recent Labs: ?05/28/2020: TSH 1.00 ?12/27/2020: ALT 24; BUN 30; Creatinine 1.80; Hemoglobin 10.1; Platelet Count 194; Potassium 5.1; Sodium 139  ? ? ?Lipid Panel ?   ?Component Value Date/Time  ? CHOL 126 05/28/2020 1042  ? CHOL 145 04/01/2020 0941  ?  TRIG 69.0 05/28/2020 1042  ? HDL 43.50 05/28/2020 1042  ? HDL 59 04/01/2020 0941  ? CHOLHDL 3 05/28/2020 1042  ? VLDL 13.8 05/28/2020 1042  ? [place] 68 05/28/2020 1042  ? [place] 73 04/01/2020 0941  ? ?  ? ?Wt Readings from Last 3 Encounters:  ?05/26/21 149 lb 9.6 oz (67.9 kg)  ?12/27/20 145 lb 12.8 oz (66.1 kg)  ?11/28/20 141 lb 6.4 oz (64.1 kg)  ?  ? ? ?Other studies Reviewed: ?Additional studies/ records that were reviewed today include: [facility] records ?Review of the above records demonstrates: See elsewhere ? ? ?ASSESSMENT AND PLAN: ? ?Chronic systolic CHF:    He is now off Cozaar.  His ejection fraction had improved from 25 to 50%.  The perfusion study does not suggest 47%.  I will follow-up with an echocardiogram but for now he can remain off the Cozaar. ? ?CAD s/p CABG x 3:     He has had no high risk findings on his perfusion study.  He continues with risk reduction.\ ? ?CKD III:     He is followed by nephrology.  As mentioned he is off the Cozaar. ? ?DYSLIPIDEMIA:     I will check a fasting lipid profile with a goal LDL probably in the 50s.  ? ?Current medicines are reviewed at length with the patient today.  The patient does not have concerns regarding medicines. ? ?The following changes have been made: None ? ?Labs/ tests ordered today include:  ? ?Orders Placed This Encounter  ?Procedures  ?  Lipid panel  ? EKG 12-Lead  ? ECHOCARDIOGRAM COMPLETE  ? ? ? ?Disposition:   FU with me in 12 months ? ? ?Signed, ?[name], MD  ?05/26/2021 8:28 AM    ?[place] ? ? ?

## 2021-05-26 ENCOUNTER — Encounter: Payer: Self-pay | Admitting: Cardiology

## 2021-05-26 ENCOUNTER — Ambulatory Visit (INDEPENDENT_AMBULATORY_CARE_PROVIDER_SITE_OTHER): Payer: Medicare Other | Admitting: Cardiology

## 2021-05-26 ENCOUNTER — Other Ambulatory Visit: Payer: Self-pay

## 2021-05-26 VITALS — BP 106/60 | HR 65 | Ht 70.0 in | Wt 149.6 lb

## 2021-05-26 DIAGNOSIS — I5022 Chronic systolic (congestive) heart failure: Secondary | ICD-10-CM

## 2021-05-26 DIAGNOSIS — E785 Hyperlipidemia, unspecified: Secondary | ICD-10-CM

## 2021-05-26 DIAGNOSIS — I2510 Atherosclerotic heart disease of native coronary artery without angina pectoris: Secondary | ICD-10-CM | POA: Diagnosis not present

## 2021-05-26 DIAGNOSIS — N183 Chronic kidney disease, stage 3 unspecified: Secondary | ICD-10-CM | POA: Diagnosis not present

## 2021-05-26 DIAGNOSIS — I428 Other cardiomyopathies: Secondary | ICD-10-CM

## 2021-05-26 LAB — LIPID PANEL
Chol/HDL Ratio: 2.6 ratio (ref 0.0–5.0)
Cholesterol, Total: 130 mg/dL (ref 100–199)
HDL: 50 mg/dL (ref >39)
LDL Chol Calc (NIH): 69 mg/dL (ref 0–99)
Triglycerides: 47 mg/dL (ref 0–149)
VLDL Cholesterol Cal: 11 mg/dL (ref 5–40)

## 2021-05-26 NOTE — Patient Instructions (Signed)
Medication Instructions:  ?Continue same medications ?*If you need a refill on your cardiac medications before your next appointment, please call your pharmacy* ? ? ?Lab Work: ?Lipid Panel ? ? ?Testing/Procedures: ?Echo ? ? ?Follow-Up: ?At [facility], you and your health needs are our priority.  As part of our continuing mission to provide you with exceptional heart care, we have created designated Provider Care Teams.  These Care Teams include your primary Cardiologist (physician) and Advanced Practice Providers (APPs -  Physician Assistants and Nurse Practitioners) who all work together to provide you with the care you need, when you need it. ? ?We recommend signing up for the patient portal called "MyChart".  Sign up information is provided on this After Visit Summary.  MyChart is used to connect with patients for Virtual Visits (Telemedicine).  Patients are able to view lab/test results, encounter notes, upcoming appointments, etc.  Non-urgent messages can be sent to your provider as well.   ?To learn more about what you can do with MyChart, go to [email or web address].   ? ?Your next appointment:  1 year ?  ? ?The format for your next appointment: Office ? ? ?Provider:  Dr.Hochrein ? ? ?

## 2021-05-30 ENCOUNTER — Encounter: Payer: Self-pay | Admitting: *Deleted

## 2021-06-02 ENCOUNTER — Encounter: Payer: Medicare Other | Admitting: Family Medicine

## 2021-06-11 ENCOUNTER — Ambulatory Visit (HOSPITAL_COMMUNITY): Payer: Medicare Other | Attending: Cardiovascular Disease

## 2021-06-11 DIAGNOSIS — I428 Other cardiomyopathies: Secondary | ICD-10-CM | POA: Insufficient documentation

## 2021-06-11 DIAGNOSIS — I2510 Atherosclerotic heart disease of native coronary artery without angina pectoris: Secondary | ICD-10-CM | POA: Diagnosis not present

## 2021-06-11 DIAGNOSIS — N183 Chronic kidney disease, stage 3 unspecified: Secondary | ICD-10-CM | POA: Diagnosis not present

## 2021-06-11 DIAGNOSIS — I5022 Chronic systolic (congestive) heart failure: Secondary | ICD-10-CM | POA: Diagnosis not present

## 2021-06-11 DIAGNOSIS — E785 Hyperlipidemia, unspecified: Secondary | ICD-10-CM | POA: Diagnosis not present

## 2021-06-11 LAB — ECHOCARDIOGRAM COMPLETE
Area-P 1/2: 3.08 cm2
P 1/2 time: 460 msec
S' Lateral: 3.4 cm

## 2021-06-16 ENCOUNTER — Encounter: Payer: Self-pay | Admitting: *Deleted

## 2021-06-23 ENCOUNTER — Encounter: Payer: Self-pay | Admitting: Family Medicine

## 2021-06-23 ENCOUNTER — Ambulatory Visit (INDEPENDENT_AMBULATORY_CARE_PROVIDER_SITE_OTHER): Payer: Medicare Other | Admitting: Family Medicine

## 2021-06-23 VITALS — BP 124/58 | HR 78 | Temp 98.6°F | Ht 70.0 in | Wt 148.6 lb

## 2021-06-23 DIAGNOSIS — Z77098 Contact with and (suspected) exposure to other hazardous, chiefly nonmedicinal, chemicals: Secondary | ICD-10-CM | POA: Insufficient documentation

## 2021-06-23 DIAGNOSIS — I5022 Chronic systolic (congestive) heart failure: Secondary | ICD-10-CM

## 2021-06-23 DIAGNOSIS — I428 Other cardiomyopathies: Secondary | ICD-10-CM

## 2021-06-23 DIAGNOSIS — Z7739 Contact with and (suspected) exposure to other war theater: Secondary | ICD-10-CM | POA: Insufficient documentation

## 2021-06-23 DIAGNOSIS — D638 Anemia in other chronic diseases classified elsewhere: Secondary | ICD-10-CM

## 2021-06-23 DIAGNOSIS — E782 Mixed hyperlipidemia: Secondary | ICD-10-CM

## 2021-06-23 DIAGNOSIS — I10 Essential (primary) hypertension: Secondary | ICD-10-CM

## 2021-06-23 DIAGNOSIS — N1832 Chronic kidney disease, stage 3b: Secondary | ICD-10-CM | POA: Diagnosis not present

## 2021-06-23 DIAGNOSIS — I25118 Atherosclerotic heart disease of native coronary artery with other forms of angina pectoris: Secondary | ICD-10-CM | POA: Diagnosis not present

## 2021-06-23 NOTE — Patient Instructions (Signed)
Please return in 6 months for hypertension follow up.  ? ?No changes are needed today. You look great! ?I will release your lab results to you on your MyChart account with further instructions. You may see the results before I do, but when I review them I will send you a message with my report or have my assistant call you if things need to be discussed. Please reply to my message with any questions. Thank you!  ? ?If you have any questions or concerns, please don't hesitate to send me a message via MyChart or call the office at [phone]. Thank you for visiting with Luis Chen today! It's our pleasure caring for you.  ?

## 2021-06-23 NOTE — Progress Notes (Signed)
?Subjective  ?Chief Complaint  ?Patient presents with  ? Annual Exam  ?  Pt here for Annual exam and is currently fasting  ? ? ?HPI: Luis Chen is a 81 y.o. male who presents to [facility] [place] today for a Male Wellness Visit. He also has the concerns and/or needs as listed above in the chief complaint. These will be addressed in addition to the Health Maintenance Visit.  ? ?Wellness Visit: annual visit with health maintenance review and exam  ? ?Health maintenance: Immunizations are current.  Will be due for his second Shingrix next month.  No longer requiring colon cancer screening.  Continues to thrive.  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  No new concerns. ? ?Body mass index is 21.32 kg/m?. ?Wt Readings from Last 3 Encounters:  ?06/23/21 148 lb 9.6 oz (67.4 kg)  ?05/26/21 149 lb 9.6 oz (67.9 kg)  ?12/27/20 145 lb 12.8 oz (66.1 kg)  ? ? ? ?Chronic disease management visit and/or acute problem visit: ?Hypertension, history of CABG and CAD, chronic heart failure due to nonischemic cardiomyopathy: Follows with cardiology regularly.  I reviewed notes.  Continues with good control.  Had recent echocardiogram, I reviewed results.  EF 50 to 55% stable.  Continue medical management.  He denies chest pain, shortness of breath, palpitations, lower extremity edema he is on a beta-blocker, aspirin therapy, Lasix and Aldactone. ?Anemia of chronic disease due to chronic kidney disease: No lower extremity edema or abdominal pain.  No fatigue. ?MGUS: Follows with hematology regularly.  I reviewed notes.  Stable. ?Hypertension: Well-controlled by home readings.  Carvedilol twice daily is well-tolerated. ?Hyperlipidemia on Crestor 20 and Zetia 10 nightly.  Fasting for recheck today. ? ?Patient Active Problem List  ? Diagnosis Date Noted  ? Exposure to Agent [facility] 06/23/2021  ? S/P CABG x 3 02/17/2018  ? CAD (coronary artery disease) 02/08/2018  ? Systolic HF (heart failure) ([place])  02/08/2018  ? Nonischemic cardiomyopathy ([place]) 07/03/2015  ? Anemia, chronic disease 07/01/2015  ? Primary osteoarthritis of knees, bilateral 12/10/2014  ? MGUS (monoclonal gammopathy of unknown significance) 06/30/2010  ? Chronic kidney disease (CKD), stage III (moderate) ([place]) 03/24/2010  ? Benign essential hypertension 10/31/2008  ? Mixed hyperlipidemia 10/30/2008  ? ?Health Maintenance  ?Topic Date Due  ? Zoster Vaccines- Shingrix (2 of 2) 08/05/2021  ? INFLUENZA VACCINE  10/07/2021  ? Pneumonia Vaccine 4+ Years old  Completed  ? COVID-19 Vaccine  Completed  ? HPV VACCINES  Aged Out  ? TETANUS/TDAP  Discontinued  ? ?Immunization History  ?Administered Date(s) Administered  ? Fluad Quad(high Dose 65+) 10/31/2018, 11/28/2020  ? Influenza Split 11/03/2010  ? Influenza, High Dose Seasonal PF 11/30/2011, 12/06/2013, 12/10/2014, 11/04/2015, 11/08/2015, 12/20/2015, 12/16/2016, 02/10/2018  ? Influenza,inj,Quad PF,6+ Mos 12/05/2012  ? Influenza-Unspecified 01/31/2017, 11/13/2018, 12/07/2020  ? Moderna Covid-19 Vaccine Bivalent Booster 25yr & up 03/06/2021  ? Moderna Sars-Covid-2 Vaccination 04/20/2019, 05/18/2019, 01/25/2020, 07/22/2020  ? Pneumococcal Conjugate-13 12/06/2013  ? Pneumococcal Polysaccharide-23 03/09/2008  ? Tdap 12/03/2008, 12/04/2014  ? Zoster Recombinat (Shingrix) 06/10/2021  ? Zoster, Live 03/09/2008  ? ?We updated and reviewed the patient's past history in detail and it is documented below. ?Allergies: ?Patient is allergic to ace inhibitors and entresto [sacubitril-valsartan]. ?Past Medical History ? has a past medical history of Anemia, chronic disease (07/01/2015), Angina decubitus ([place], Benign essential hypertension (10/31/2008), CAD (coronary artery disease), CARDIOMYOPATHY (11/27/2008), CKD (chronic kidney disease), Hyperlipidemia, Hypertension, Mixed hyperlipidemia (10/30/2008), Non-traumatic rhabdomyolysis (07/03/2015), Polyarthropathy (  07/01/2015), Primary osteoarthritis of knees, bilateral  (12/10/2014), Pyogenic arthritis of left knee joint ([place]), Swelling of left knee joint (09/07/6376), and Systolic HF (heart failure) ([place]) (02/08/2018). ?Past Surgical History ?Patient  has a past surgical history that includes Knee arthroscopy (Left, 07/01/2015); RIGHT/LEFT HEART CATH AND CORONARY ANGIOGRAPHY (N/A, 02/08/2018); Coronary artery bypass graft (N/A, 02/17/2018); and TEE without cardioversion (N/A, 02/17/2018). ?Social History ?Patient  reports that he has never smoked. He has never used smokeless tobacco. He reports current alcohol use. He reports that he does not use drugs. ?Family History ?family history includes Arthritis in his mother; Diabetes in his mother; Heart attack (age of onset: 64) in his mother; Heart disease in his brother and mother; Hyperlipidemia in his mother; Hypertension in his mother; Hypothyroidism in his daughter; Stroke in his daughter. ?Review of Systems: ?Constitutional: negative for fever or malaise ?Ophthalmic: negative for photophobia, double vision or loss of vision ?Cardiovascular: negative for chest pain, dyspnea on exertion, or new LE swelling ?Respiratory: negative for SOB or persistent cough ?Gastrointestinal: negative for abdominal pain, change in bowel habits or melena ?Genitourinary: negative for dysuria or gross hematuria ?Musculoskeletal: negative for new gait disturbance or muscular weakness ?Integumentary: negative for new or persistent rashes ?Neurological: negative for TIA or stroke symptoms ?Psychiatric: negative for SI or delusions ?Allergic/Immunologic: negative for hives ? ?Patient Care Team  ?  Relationship Specialty Notifications Start End  ?Leamon Arnt, MD PCP - General Family Medicine  07/01/15   ?Larey Dresser, MD PCP - Advanced Heart Failure Cardiology  12/05/18   ?[name], MD PCP - Cardiology Cardiology Admissions 07/26/19   ?Renette Butters, MD Attending Physician Orthopedic Surgery  02/23/17   ?[name], MD Consulting  Physician Nephrology  02/23/17   ?Caryl Pina, MD  Hematology  02/23/17   ?[name], MD Consulting Physician Cardiology  02/23/17   ?[place] providers    08/25/17   ?Wyatt Portela, MD Consulting Physician Oncology  01/16/19   ?[facility] Consulting Physician   01/16/19   ?Madelin Rear, [facility] Pharmacist Pharmacist  10/04/19   ? Comment: PHONE NUMBER [phone]  ? ?Objective  ?Vitals: BP (!) 151/71   Pulse 78   Temp 98.6 ?F (37 ?C)   Ht '5\' 10"'$  (1.778 m)   Wt 148 lb 9.6 oz (67.4 kg)   SpO2 96%   BMI 21.32 kg/m?  ?General:  Well developed, well nourished, no acute distress  ?Psych:  Alert and orientedx3,normal mood and affect ?HEENT:  Normocephalic, atraumatic, non-icteric sclera, PERRL, oropharynx is clear without mass or exudate, supple neck without adenopathy, mass or thyromegaly ?Cardiovascular:  Normal S1, S2, RRR without gallop, rub or murmur, nondisplaced PMI, +2 distal pulses in bilateral upper and lower extremities. ?Respiratory:  Good breath sounds bilaterally, CTAB with normal respiratory effort ?Gastrointestinal: normal bowel sounds, soft, non-tender, no noted masses. No HSM ?MSK: no deformities, contusions. Joints are without erythema or swelling. Spine and CVA region are nontender ?Skin:  Warm, no rashes or suspicious lesions noted ?Neurologic:    Mental status is normal. CN 2-11 are normal. Gross motor and sensory exams are normal. Stable gait. No tremor ?GU: No inguinal hernias or adenopathy are appreciated bilaterally ?  ?Assessment  ?1. Chronic systolic heart failure ([place])   ?2. Benign essential hypertension   ?3. Coronary artery disease involving native coronary artery of native heart with other form of angina pectoris ([place])   ?4. Anemia, chronic disease   ?5. Stage 3b chronic kidney disease ([place])   ?  6. Mixed hyperlipidemia   ?7. Nonischemic cardiomyopathy ([place])   ? ?  ?Plan  ?Male Wellness Visit: ?Age appropriate Health Maintenance and Prevention measures were discussed with  patient. Included topics are cancer screening recommendations, ways to keep healthy (see AVS) including dietary and exercise recommendations, regular eye and dental care, use of seat belts, and avoidance of moderate alcoh

## 2021-06-24 DIAGNOSIS — N1831 Chronic kidney disease, stage 3a: Secondary | ICD-10-CM | POA: Diagnosis not present

## 2021-06-24 DIAGNOSIS — D472 Monoclonal gammopathy: Secondary | ICD-10-CM | POA: Diagnosis not present

## 2021-06-24 DIAGNOSIS — I429 Cardiomyopathy, unspecified: Secondary | ICD-10-CM | POA: Diagnosis not present

## 2021-06-24 DIAGNOSIS — I129 Hypertensive chronic kidney disease with stage 1 through stage 4 chronic kidney disease, or unspecified chronic kidney disease: Secondary | ICD-10-CM | POA: Diagnosis not present

## 2021-06-24 DIAGNOSIS — D631 Anemia in chronic kidney disease: Secondary | ICD-10-CM | POA: Diagnosis not present

## 2021-06-24 DIAGNOSIS — E611 Iron deficiency: Secondary | ICD-10-CM | POA: Diagnosis not present

## 2021-06-24 DIAGNOSIS — N179 Acute kidney failure, unspecified: Secondary | ICD-10-CM | POA: Diagnosis not present

## 2021-06-24 LAB — CBC WITH DIFFERENTIAL/PLATELET
Basophils Absolute: 0.1 10*3/uL (ref 0.0–0.1)
Basophils Relative: 1.1 % (ref 0.0–3.0)
Eosinophils Absolute: 0.5 10*3/uL (ref 0.0–0.7)
Eosinophils Relative: 8.5 % — ABNORMAL HIGH (ref 0.0–5.0)
HCT: 30.5 % — ABNORMAL LOW (ref 39.0–52.0)
Hemoglobin: 10.4 g/dL — ABNORMAL LOW (ref 13.0–17.0)
Lymphocytes Relative: 25.5 % (ref 12.0–46.0)
Lymphs Abs: 1.6 10*3/uL (ref 0.7–4.0)
MCHC: 34 g/dL (ref 30.0–36.0)
MCV: 94.6 fl (ref 78.0–100.0)
Monocytes Absolute: 0.8 10*3/uL (ref 0.1–1.0)
Monocytes Relative: 13 % — ABNORMAL HIGH (ref 3.0–12.0)
Neutro Abs: 3.2 10*3/uL (ref 1.4–7.7)
Neutrophils Relative %: 51.9 % (ref 43.0–77.0)
Platelets: 178 10*3/uL (ref 150.0–400.0)
RBC: 3.23 Mil/uL — ABNORMAL LOW (ref 4.22–5.81)
RDW: 14 % (ref 11.5–15.5)
WBC: 6.2 10*3/uL (ref 4.0–10.5)

## 2021-06-24 LAB — LIPID PANEL
Cholesterol: 110 mg/dL (ref 0–200)
HDL: 40.6 mg/dL (ref >39.00)
LDL Cholesterol: 58 mg/dL (ref 0–99)
NonHDL: 69.27
Total CHOL/HDL Ratio: 3
Triglycerides: 54 mg/dL (ref 0.0–149.0)
VLDL: 10.8 mg/dL (ref 0.0–40.0)

## 2021-06-24 LAB — COMPREHENSIVE METABOLIC PANEL
ALT: 27 U/L (ref 0–53)
AST: 32 U/L (ref 0–37)
Albumin: 3.6 g/dL (ref 3.5–5.2)
Alkaline Phosphatase: 77 U/L (ref 39–117)
BUN: 24 mg/dL — ABNORMAL HIGH (ref 6–23)
CO2: 24 mEq/L (ref 19–32)
Calcium: 9.1 mg/dL (ref 8.4–10.5)
Chloride: 109 mEq/L (ref 96–112)
Creatinine, Ser: 1.42 mg/dL (ref 0.40–1.50)
GFR: 45.91 mL/min — ABNORMAL LOW (ref >60.00)
Glucose, Bld: 77 mg/dL (ref 70–99)
Potassium: 4.4 mEq/L (ref 3.5–5.1)
Sodium: 140 mEq/L (ref 135–145)
Total Bilirubin: 0.4 mg/dL (ref 0.2–1.2)
Total Protein: 7.1 g/dL (ref 6.0–8.3)

## 2021-06-24 LAB — TSH: TSH: 1.31 u[IU]/mL (ref 0.35–5.50)

## 2021-08-12 ENCOUNTER — Ambulatory Visit: Payer: Medicare Other | Admitting: Family Medicine

## 2021-10-22 DIAGNOSIS — N179 Acute kidney failure, unspecified: Secondary | ICD-10-CM | POA: Diagnosis not present

## 2021-10-22 DIAGNOSIS — E611 Iron deficiency: Secondary | ICD-10-CM | POA: Diagnosis not present

## 2021-10-22 DIAGNOSIS — I429 Cardiomyopathy, unspecified: Secondary | ICD-10-CM | POA: Diagnosis not present

## 2021-10-22 DIAGNOSIS — D631 Anemia in chronic kidney disease: Secondary | ICD-10-CM | POA: Diagnosis not present

## 2021-10-22 DIAGNOSIS — N1831 Chronic kidney disease, stage 3a: Secondary | ICD-10-CM | POA: Diagnosis not present

## 2021-10-22 DIAGNOSIS — D472 Monoclonal gammopathy: Secondary | ICD-10-CM | POA: Diagnosis not present

## 2021-10-22 DIAGNOSIS — I129 Hypertensive chronic kidney disease with stage 1 through stage 4 chronic kidney disease, or unspecified chronic kidney disease: Secondary | ICD-10-CM | POA: Diagnosis not present

## 2021-11-27 ENCOUNTER — Telehealth: Payer: Self-pay | Admitting: Pharmacist

## 2021-11-27 NOTE — Progress Notes (Unsigned)
Chronic Care Management Pharmacy Assistant   Name: Luis Chen  MRN: [id number] DOB: 1941/02/27   Reason for Encounter: General Adherence Call    Recent office visits:  06/23/2021 OV (PCP) Luis Arnt, MD; no medication changes indicated  Recent consult visits:  05/26/2021 OV (Cardiology) Luis Breeding, MD; no medication changes indicated.  Hospital visits:  None in previous 6 months  Medications: Outpatient Encounter Medications as of 11/27/2021  Medication Sig   acetic acid 2 % otic solution    aspirin 81 MG EC tablet Take 1 tablet (81 mg total) by mouth daily.   carvedilol (COREG) 6.25 MG tablet Take 0.5 tablets (3.125 mg total) by mouth 2 (two) times daily with a meal.   ezetimibe (ZETIA) 10 MG tablet Take 1 tablet (10 mg total) by mouth daily.   feeding supplement, ENSURE ENLIVE, (ENSURE ENLIVE) LIQD Take 237 mLs by mouth 3 (three) times daily between meals.   Ferrous Gluconate 324 (37.5 Fe) MG TABS TAKE 1 TABLET BY MOUTH TWICE DAILY WITH MEALS   furosemide (LASIX) 20 MG tablet Take 20 mg by mouth daily.   latanoprost (XALATAN) 0.005 % ophthalmic solution Place 1 drop into both eyes daily.   rosuvastatin (CRESTOR) 40 MG tablet TAKE 1 TABLET BY MOUTH ONCE DAILY AT  6PM   spironolactone (ALDACTONE) 25 MG tablet Take 1 tablet (25 mg total) by mouth daily.   traZODone (DESYREL) 150 MG tablet Take 150 mg by mouth at bedtime as needed for sleep.    No facility-administered encounter medications on file as of 11/27/2021.   Contacted Luis Chen for General Review Call   Chart Review:  Have there been any documented new, changed, or discontinued medications since last visit? {yes/no:20286} (If yes, include name, dose, frequency, date) Has there been any documented recent hospitalizations or ED visits since last visit with Clinical Pharmacist? {yes/no:20286} Brief Summary (including medication and/or Diagnosis changes):   Adherence Review:  Does the Clinical  Pharmacist Assistant have access to adherence rates? {yes/no:20286} Adherence rates for STAR metric medications (List medication(s)/day supply/ last 2 fill dates). Adherence rates for medications indicated for disease state being reviewed (List medication(s)/day supply/ last 2 fill dates). Does the patient have >5 day gap between last estimated fill dates for any of the above medications or other medication gaps? {yes/no:20286} Reason for medication gaps.   Disease State Questions:  Able to connect with Patient? {yes/no:20286} Did patient have any problems with their health recently? {yes/no:20286} Note problems and Concerns: Have you had any admissions or emergency room visits or worsening of your condition(s) since last visit? {yes/no:20286} Details of ED visit, hospital visit and/or worsening condition(s): Have you had any visits with new specialists or providers since your last visit? {yes/no:20286} Explain: Have you had any new health care problem(s) since your last visit? {yes/no:20286} New problem(s) reported: Have you run out of any of your medications since you last spoke with clinical pharmacist? {yes/no:20286} What caused you to run out of your medications? Are there any medications you are not taking as prescribed? {yes/no:20286} What kept you from taking your medications as prescribed? Are you having any issues or side effects with your medications? {yes/no:20286} Note of issues or side effects: Do you have any other health concerns or questions you want to discuss with your Clinical Pharmacist before your next visit? {yes/no:20286} Note additional concerns and questions from Patient. Are there any health concerns that you feel we can do a better job addressing? {yes/no:20286} Note  Patient's response. Are you having any problems with any of the following since the last visit: (select all that apply)  {General Call:27390}  Details: 12. Any falls since last visit?  {yes/no:20286}  Details: 13. Any increased or uncontrolled pain since last visit? {yes/no:20286}  Details: 14. Next visit Type: {Telephone/Office:25179}       Visit with:        Date:        Time:  32. Additional Details? {yes/no:20286}   Care Gaps: Medicare Annual Wellness: last AWV 01/16/2019 Hemoglobin A1C: 5.7% on 02/16/2018 Colonoscopy: Aged out  Future Appointments  Date Time Provider [place]  12/23/2021  9:30 AM Luis Arnt, MD [facility]  12/26/2021 10:00 AM [facility] None  12/26/2021 10:30 AM Luis Portela, MD [facility] None   Star Rating Drugs: Rosuvastatin 40 mg - no recent fill date available  Luis Chen, [place] Pharmacist Assistant [phone]

## 2021-12-01 ENCOUNTER — Encounter: Payer: Self-pay | Admitting: *Deleted

## 2021-12-23 ENCOUNTER — Ambulatory Visit (INDEPENDENT_AMBULATORY_CARE_PROVIDER_SITE_OTHER): Payer: Medicare Other | Admitting: Family Medicine

## 2021-12-23 ENCOUNTER — Encounter: Payer: Self-pay | Admitting: Family Medicine

## 2021-12-23 VITALS — BP 126/64 | HR 75 | Temp 98.6°F | Ht 70.0 in | Wt 150.2 lb

## 2021-12-23 DIAGNOSIS — I428 Other cardiomyopathies: Secondary | ICD-10-CM | POA: Diagnosis not present

## 2021-12-23 DIAGNOSIS — Z23 Encounter for immunization: Secondary | ICD-10-CM

## 2021-12-23 DIAGNOSIS — N1831 Chronic kidney disease, stage 3a: Secondary | ICD-10-CM | POA: Diagnosis not present

## 2021-12-23 DIAGNOSIS — D638 Anemia in other chronic diseases classified elsewhere: Secondary | ICD-10-CM | POA: Diagnosis not present

## 2021-12-23 DIAGNOSIS — I10 Essential (primary) hypertension: Secondary | ICD-10-CM | POA: Diagnosis not present

## 2021-12-23 NOTE — Patient Instructions (Signed)
Please return in 6 months for your annual complete physical; please come fasting.   Glad you are well!  If you have any questions or concerns, please don't hesitate to send me a message via MyChart or call the office at [phone]. Thank you for visiting with [place] today! It's our pleasure caring for you.

## 2021-12-23 NOTE — Progress Notes (Signed)
Subjective  CC:  Chief Complaint  Patient presents with   Hypertension    Pt here for 66mo F/U with Bp    HPI: Luis MCVAYis a 81y.o. male who presents to the office today to address the problems listed above in the chief complaint. Hypertension f/u: Control is good . Pt reports he is doing well. taking medications as instructed, no medication side effects noted, no TIAs, no chest pain on exertion, no dyspnea on exertion, no swelling of ankles. He denies adverse effects from his BP medications. Compliance with medication is good.  CKD: reviewed [facility] notes: stable CKD with stable labs including cbc, cmp and urine. Anemia remains stable on oral iron.  CHF: no edema, sob, orthopnea. No cp. Reviewed meds . No changes since last visit.  Flu shot today.   Assessment  1. Benign essential hypertension   2. Stage 3a chronic kidney disease ([place]   3. Nonischemic cardiomyopathy ([place]   4. Anemia, chronic disease   5. Need for immunization against influenza      Plan   Hypertension f/u: BP control is well controlled. No med changes CKD w/ anemia: stable. Has frequent f/u with renal. Avoid nephrotoxins CHF: doing well. Keeping active.  Flu shot updated.  Education regarding management of these chronic disease states was given. Management strategies discussed on successive visits include dietary and exercise recommendations, goals of achieving and maintaining IBW, and lifestyle modifications aiming for adequate sleep and minimizing stressors.   Follow up: 6 mo for cpe  Orders Placed This Encounter  Procedures   Flu Vaccine QUAD High Dose(Fluad)   No orders of the defined types were placed in this encounter.     BP Readings from Last 3 Encounters:  12/23/21 126/64  06/23/21 (!) 124/58  05/26/21 106/60   Wt Readings from Last 3 Encounters:  12/23/21 150 lb 3.2 oz (68.1 kg)  06/23/21 148 lb 9.6 oz (67.4 kg)  05/26/21 149 lb 9.6 oz (67.9 kg)    Lab Results  Component Value  Date   CHOL 110 06/23/2021   CHOL 130 05/26/2021   CHOL 126 05/28/2020   Lab Results  Component Value Date   HDL 40.60 06/23/2021   HDL 50 05/26/2021   HDL 43.50 05/28/2020   Lab Results  Component Value Date   LDLCALC 58 06/23/2021   LDLCALC 69 05/26/2021   LDLCALC 68 05/28/2020   Lab Results  Component Value Date   TRIG 54.0 06/23/2021   TRIG 47 05/26/2021   TRIG 69.0 05/28/2020   Lab Results  Component Value Date   CHOLHDL 3 06/23/2021   CHOLHDL 2.6 05/26/2021   CHOLHDL 3 05/28/2020   No results found for: "LDLDIRECT" Lab Results  Component Value Date   CREATININE 1.42 06/23/2021   BUN 24 (H) 06/23/2021   NA 140 06/23/2021   K 4.4 06/23/2021   CL 109 06/23/2021   CO2 24 06/23/2021    The ASCVD Risk score (Arnett DK, et al., 2019) failed to calculate for the following reasons:   The 2019 ASCVD risk score is only valid for ages 487to 724 I reviewed the patients updated PMH, FH, and SocHx.    Patient Active Problem List   Diagnosis Date Noted   [facility]   S/P CABG x 3 02/17/2018   CAD (coronary artery disease) 171/08/2692  Systolic HF (heart failure) ([place] 02/08/2018   Nonischemic cardiomyopathy ([place] 07/03/2015   Anemia, chronic disease 07/01/2015  Primary osteoarthritis of knees, bilateral 12/10/2014   MGUS (monoclonal gammopathy of unknown significance) 06/30/2010   Chronic kidney disease (CKD), stage III (moderate) ([facility]) 03/24/2010   Benign essential hypertension 10/31/2008   Mixed hyperlipidemia 10/30/2008    Allergies: Ace inhibitors and Entresto [sacubitril-valsartan]  Social History: Patient  reports that he has never smoked. He has never used smokeless tobacco. He reports current alcohol use. He reports that he does not use drugs.  Current Meds  Medication Sig   acetic acid 2 % otic solution    aspirin 81 MG EC tablet Take 1 tablet (81 mg total) by mouth daily.   carvedilol (COREG) 6.25 MG tablet Take 0.5  tablets (3.125 mg total) by mouth 2 (two) times daily with a meal.   feeding supplement, ENSURE ENLIVE, (ENSURE ENLIVE) LIQD Take 237 mLs by mouth 3 (three) times daily between meals.   Ferrous Gluconate 324 (37.5 Fe) MG TABS TAKE 1 TABLET BY MOUTH TWICE DAILY WITH MEALS   furosemide (LASIX) 20 MG tablet Take 20 mg by mouth daily.   latanoprost (XALATAN) 0.005 % ophthalmic solution Place 1 drop into both eyes daily.   rosuvastatin (CRESTOR) 40 MG tablet TAKE 1 TABLET BY MOUTH ONCE DAILY AT  6PM   traZODone (DESYREL) 150 MG tablet Take 150 mg by mouth at bedtime as needed for sleep.     Review of Systems: Cardiovascular: negative for chest pain, palpitations, leg swelling, orthopnea Respiratory: negative for SOB, wheezing or persistent cough Gastrointestinal: negative for abdominal pain Genitourinary: negative for dysuria or gross hematuria  Objective  Vitals: BP 126/64   Pulse 75   Temp 98.6 F (37 C)   Ht '5\' 10"'$  (1.778 m)   Wt 150 lb 3.2 oz (68.1 kg)   SpO2 97%   BMI 21.55 kg/m  General: no acute distress  Psych:  Alert and oriented, normal mood and affect HEENT:  Normocephalic, atraumatic, supple neck  Cardiovascular:  RRR without murmur. no edema Respiratory:  Good breath sounds bilaterally, CTAB with normal respiratory effort Skin:  Warm, no rashes Neurologic:   Mental status is normal [place] side effects, risks, benefits, and alternatives for medications and treatment plan prescribed today were discussed, and the patient expressed understanding of the given instructions. Patient is instructed to call or message via MyChart if he/she has any questions or concerns regarding our treatment plan. No barriers to understanding were identified. We discussed Red Flag symptoms and signs in detail. Patient expressed understanding regarding what to do in case of urgent or emergency type symptoms.  Medication list was reconciled, printed and provided to the patient in AVS. Patient  instructions and summary information was reviewed with the patient as documented in the AVS. This note was prepared with assistance of Dragon voice recognition software. Occasional wrong-word or sound-a-like substitutions may have occurred due to the inherent limitation

## 2021-12-26 ENCOUNTER — Other Ambulatory Visit: Payer: Self-pay | Admitting: Oncology

## 2021-12-26 ENCOUNTER — Inpatient Hospital Stay (HOSPITAL_BASED_OUTPATIENT_CLINIC_OR_DEPARTMENT_OTHER): Payer: Medicare Other | Admitting: Oncology

## 2021-12-26 ENCOUNTER — Inpatient Hospital Stay: Payer: Medicare Other | Attending: Oncology

## 2021-12-26 ENCOUNTER — Other Ambulatory Visit: Payer: Self-pay

## 2021-12-26 VITALS — BP 106/56 | HR 70 | Temp 98.1°F | Resp 16 | Ht 70.0 in | Wt 146.9 lb

## 2021-12-26 DIAGNOSIS — D631 Anemia in chronic kidney disease: Secondary | ICD-10-CM | POA: Insufficient documentation

## 2021-12-26 DIAGNOSIS — D472 Monoclonal gammopathy: Secondary | ICD-10-CM

## 2021-12-26 DIAGNOSIS — Z79899 Other long term (current) drug therapy: Secondary | ICD-10-CM | POA: Diagnosis not present

## 2021-12-26 DIAGNOSIS — D638 Anemia in other chronic diseases classified elsewhere: Secondary | ICD-10-CM

## 2021-12-26 DIAGNOSIS — N189 Chronic kidney disease, unspecified: Secondary | ICD-10-CM | POA: Insufficient documentation

## 2021-12-26 DIAGNOSIS — D509 Iron deficiency anemia, unspecified: Secondary | ICD-10-CM

## 2021-12-26 DIAGNOSIS — R768 Other specified abnormal immunological findings in serum: Secondary | ICD-10-CM | POA: Diagnosis not present

## 2021-12-26 LAB — CBC WITH DIFFERENTIAL (CANCER CENTER ONLY)
Abs Immature Granulocytes: 0.01 10*3/uL (ref 0.00–0.07)
Basophils Absolute: 0 10*3/uL (ref 0.0–0.1)
Basophils Relative: 0 %
Eosinophils Absolute: 0.3 10*3/uL (ref 0.0–0.5)
Eosinophils Relative: 6 %
HCT: 33.5 % — ABNORMAL LOW (ref 39.0–52.0)
Hemoglobin: 11.3 g/dL — ABNORMAL LOW (ref 13.0–17.0)
Immature Granulocytes: 0 %
Lymphocytes Relative: 39 %
Lymphs Abs: 2 10*3/uL (ref 0.7–4.0)
MCH: 31.9 pg (ref 26.0–34.0)
MCHC: 33.7 g/dL (ref 30.0–36.0)
MCV: 94.6 fL (ref 80.0–100.0)
Monocytes Absolute: 0.5 10*3/uL (ref 0.1–1.0)
Monocytes Relative: 9 %
Neutro Abs: 2.3 10*3/uL (ref 1.7–7.7)
Neutrophils Relative %: 46 %
Platelet Count: 174 10*3/uL (ref 150–400)
RBC: 3.54 MIL/uL — ABNORMAL LOW (ref 4.22–5.81)
RDW: 14.4 % (ref 11.5–15.5)
WBC Count: 5.2 10*3/uL (ref 4.0–10.5)
nRBC: 0 % (ref 0.0–0.2)

## 2021-12-26 LAB — CMP (CANCER CENTER ONLY)
ALT: 17 U/L (ref 0–44)
AST: 23 U/L (ref 15–41)
Albumin: 3.7 g/dL (ref 3.5–5.0)
Alkaline Phosphatase: 74 U/L (ref 38–126)
Anion gap: 5 (ref 5–15)
BUN: 21 mg/dL (ref 8–23)
CO2: 29 mmol/L (ref 22–32)
Calcium: 9.4 mg/dL (ref 8.9–10.3)
Chloride: 106 mmol/L (ref 98–111)
Creatinine: 1.59 mg/dL — ABNORMAL HIGH (ref 0.61–1.24)
GFR, Estimated: 43 mL/min — ABNORMAL LOW (ref >60)
Glucose, Bld: 93 mg/dL (ref 70–99)
Potassium: 4.3 mmol/L (ref 3.5–5.1)
Sodium: 140 mmol/L (ref 135–145)
Total Bilirubin: 0.5 mg/dL (ref 0.3–1.2)
Total Protein: 7.2 g/dL (ref 6.5–8.1)

## 2021-12-26 LAB — IRON AND IRON BINDING CAPACITY (CC-WL,HP ONLY)
Iron: 73 ug/dL (ref 45–182)
Saturation Ratios: 28 % (ref 17.9–39.5)
TIBC: 260 ug/dL (ref 250–450)
UIBC: 187 ug/dL (ref 117–376)

## 2021-12-26 LAB — FERRITIN: Ferritin: 274 ng/mL (ref 24–336)

## 2021-12-26 NOTE — Progress Notes (Signed)
Hematology and Oncology Follow Up Visit  Luis Chen [id number] [birth year] 81 y.o. 12/26/2021 10:01 AM  CC: [name], MD  [name], M.D.  [name], MD, [facility]    Principle Diagnosis:  81 year old man with:  1.  MGUS diagnosed in 2012.  He was found to have IgG subtype but without any evidence of endorgan damage and M spike of 0.2 g/dL.    2.  Anemia related to chronic kidney disease as well as mild iron deficiency diagnosed in 2012.  Current therapy: Oral iron supplement   Interim History: [name] presents today for repeat follow-up.  Since her last visit, he reports no major changes in his health.  He denies any recent hospitalizations or illnesses.  He denies excessive fatigue or tiredness.  His performance status and quality of life remain excellent.  Continues to exercise regularly.    Medications: Reviewed reviewed changes.    aspirin EC 325 MG EC tablet, Take 1 tablet (325 mg total) by mouth daily., Disp: 30 tablet, Rfl: 0   carvedilol (COREG) 6.25 MG tablet, Take 1 tablet (6.25 mg total) by mouth 2 (two) times daily with a meal., Disp: 180 tablet, Rfl: 3   ezetimibe (ZETIA) 10 MG tablet, Take 1 tablet (10 mg total) by mouth daily., Disp: 30 tablet, Rfl: 11   feeding supplement, ENSURE ENLIVE, (ENSURE ENLIVE) LIQD, Take 237 mLs by mouth 3 (three) times daily between meals., Disp: 237 mL, Rfl: 12   Ferrous Gluconate 324 (37.5 Fe) MG TABS, TAKE 1 TABLET BY MOUTH TWICE DAILY WITH MEALS, Disp: 60 tablet, Rfl: 1   latanoprost (XALATAN) 0.005 % ophthalmic solution, Place 1 drop into both eyes at bedtime. , Disp: , Rfl:    losartan (COZAAR) 50 MG tablet, Take 1 tablet (50 mg total) by mouth daily., Disp: 90 tablet, Rfl: 3   rosuvastatin (CRESTOR) 40 MG tablet, TAKE 1 TABLET BY MOUTH ONCE DAILY AT  6PM, Disp: 90 tablet, Rfl: 3   spironolactone (ALDACTONE) 25 MG tablet, Take 1 tablet (25 mg total) by mouth daily., Disp: 90 tablet, Rfl: 3   traZODone (DESYREL)  150 MG tablet, Take 100 mg by mouth at bedtime as needed for sleep. , Disp: , Rfl:   Allergies:  Allergies  Allergen Reactions   Ace Inhibitors Swelling    Facial swelling    Entresto [Sacubitril-Valsartan] Swelling    Angioedema with ACE Inhibitor       Physical Exam:   Blood pressure (!) 106/56, pulse 70, temperature 98.1 F (36.7 C), temperature source Temporal, resp. rate 16, height 5' 10" (1.778 m), weight 146 lb 14.4 oz (66.6 kg), SpO2 100 %.    ECOG: 0    General appearance: Alert, awake without any distress. Head: Atraumatic without abnormalities Oropharynx: Without any thrush or ulcers. Eyes: No scleral icterus. Lymph nodes: No lymphadenopathy noted in the cervical, supraclavicular, or axillary nodes Heart:regular rate and rhythm, without any murmurs or gallops.   Lung: Clear to auscultation without any rhonchi, wheezes or dullness to percussion. Abdomin: Soft, nontender without any shifting dullness or ascites. Musculoskeletal: No clubbing or cyanosis. Neurological: No motor or sensory deficits. Skin: No rashes or lesions.       Lab Results: Lab Results  Component Value Date   WBC 6.2 06/23/2021   HGB 10.4 (L) 06/23/2021   HCT 30.5 (L) 06/23/2021   MCV 94.6 06/23/2021   PLT 178.0 06/23/2021     Chemistry      Component Value Date/Time  NA 140 06/23/2021 1514   NA 141 04/11/2020 1046   NA 139 06/12/2016 1313   K 4.4 06/23/2021 1514   K 4.4 06/12/2016 1313   CL 109 06/23/2021 1514   CO2 24 06/23/2021 1514   CO2 25 06/12/2016 1313   BUN 24 (H) 06/23/2021 1514   BUN 23 04/11/2020 1046   BUN 17.9 06/12/2016 1313   CREATININE 1.42 06/23/2021 1514   CREATININE 1.80 (H) 12/27/2020 1311   CREATININE 1.2 06/12/2016 1313   GLU 92 09/14/2018 0000      Component Value Date/Time   CALCIUM 9.1 06/23/2021 1514   CALCIUM 9.3 06/12/2016 1313   ALKPHOS 77 06/23/2021 1514   ALKPHOS 98 06/12/2016 1313   AST 32 06/23/2021 1514   AST 25 12/27/2020 1311    AST 18 06/12/2016 1313   ALT 27 06/23/2021 1514   ALT 24 12/27/2020 1311   ALT 16 06/12/2016 1313   BILITOT 0.4 06/23/2021 1514   BILITOT 0.6 12/27/2020 1311   BILITOT 0.44 06/12/2016 1313           Assessment and plan:  81 year old man with the following:  1.  IgG monoclonal gammopathy diagnosed in 2012.  He has been on active surveillance without any evidence of endorgan damage or indication for treatment.   Protein studies obtained in October 2022 were personally reviewed and compared to previous labs.  These have continued to show very minimal M spike with a normal IgG level and slight elevation in his IgM level.  The differential diagnosis includes a MGUS versus a possible lymphoproliferative disorder.  Given the chronicity of these findings, this likely indicates that probably a chronic indolent process.  The rationale for obtaining bone marrow biopsy was discussed today including risks and benefits.  Options been deferred for the time being given the mild stable findings overall.  Laboratory data from today reviewed and discussed with the patient continues to show hemoglobin that is consistent with his baseline and does not require intervention.  2.  Anemia: His hemoglobin have ranged between 10 and 11 for the last 3 to 4 years.  Etiology appears to be multifactorial related to chronic kidney disease, chronic disease, mild iron deficiency and possible lymphoproliferative disorder.  At this time, I recommended continued monitoring and consider growth factor support after obtaining a bone marrow biopsy in the future.    3.  Chronic renal insufficiency: His creatinine clearance around 45 cc/min and has not changed dramatically over the last few years.  4.  Follow-up: In 1 year for repeat follow-up.   30  minutes were dedicated to this visit.  The time was spent on reviewing laboratory data, discussing differential diagnosis and management options for the future.   Zola Button, MD 10/20/202310:01 AM

## 2021-12-29 LAB — KAPPA/LAMBDA LIGHT CHAINS
Kappa free light chain: 108.5 mg/L — ABNORMAL HIGH (ref 3.3–19.4)
Kappa, lambda light chain ratio: 4.09 — ABNORMAL HIGH (ref 0.26–1.65)
Lambda free light chains: 26.5 mg/L — ABNORMAL HIGH (ref 5.7–26.3)

## 2021-12-31 LAB — MULTIPLE MYELOMA PANEL, SERUM
Albumin SerPl Elph-Mcnc: 3.3 g/dL (ref 2.9–4.4)
Albumin/Glob SerPl: 1.1 (ref 0.7–1.7)
Alpha 1: 0.3 g/dL (ref 0.0–0.4)
Alpha2 Glob SerPl Elph-Mcnc: 0.7 g/dL (ref 0.4–1.0)
B-Globulin SerPl Elph-Mcnc: 0.9 g/dL (ref 0.7–1.3)
Gamma Glob SerPl Elph-Mcnc: 1.4 g/dL (ref 0.4–1.8)
Globulin, Total: 3.2 g/dL (ref 2.2–3.9)
IgA: 169 mg/dL (ref 61–437)
IgG (Immunoglobin G), Serum: 1498 mg/dL (ref 603–1613)
IgM (Immunoglobulin M), Srm: 155 mg/dL — ABNORMAL HIGH (ref 15–143)
M Protein SerPl Elph-Mcnc: 0.3 g/dL — ABNORMAL HIGH
Total Protein ELP: 6.5 g/dL (ref 6.0–8.5)

## 2022-02-09 ENCOUNTER — Telehealth: Payer: Self-pay | Admitting: Family Medicine

## 2022-02-09 NOTE — Telephone Encounter (Signed)
Form has been placed in Luis Chen's box.  02/10/2022 Pt has been notified that placard form has been placed up from at check in for pick up

## 2022-02-09 NOTE — Telephone Encounter (Signed)
.  Pt dropped off disability placard form.  Placed in providers box, please call pt when completed.

## 2022-02-10 ENCOUNTER — Encounter (HOSPITAL_COMMUNITY): Payer: Self-pay | Admitting: *Deleted

## 2022-02-10 DIAGNOSIS — Z0279 Encounter for issue of other medical certificate: Secondary | ICD-10-CM

## 2022-02-10 NOTE — Progress Notes (Deleted)
Chronic Care Management Pharmacy Note  02/10/2022 Name:  Luis Chen MRN:  [id number] DOB:  [birth year]  Summary: ***  Recommendations/Changes made from today's visit: ***  Plan: ***   Subjective: Luis Chen is an 81 y.o. year old male who is a primary patient of Leamon Arnt, MD.  The CCM team was consulted for assistance with disease management and care coordination needs.    Engaged with patient by telephone for follow up visit in response to provider referral for pharmacy case management and/or care coordination services.   Consent to Services:  The patient was given the following information about Chronic Care Management services today, agreed to services, and gave verbal consent: 1. CCM service includes personalized support from designated clinical staff supervised by the primary care provider, including individualized plan of care and coordination with other care providers 2. 24/7 contact phone numbers for assistance for urgent and routine care needs. 3. Service will only be billed when office clinical staff spend 20 minutes or more in a month to coordinate care. 4. Only one practitioner may furnish and bill the service in a calendar month. 5.The patient may stop CCM services at any time (effective at the end of the month) by phone call to the office staff. 6. The patient will be responsible for cost sharing (co-pay) of up to 20% of the service fee (after annual deductible is met). Patient agreed to services and consent obtained.  Patient Care Team: Leamon Arnt, MD as PCP - General (Family Medicine) Larey Dresser, MD as PCP - Advanced Heart Failure (Cardiology) [name], MD as PCP - Cardiology (Cardiology) Renette Butters, MD as Attending Physician (Orthopedic Surgery) [name], MD as Consulting Physician (Nephrology) Caryl Pina, MD (Hematology) [name], MD as Consulting Physician (Cardiology) [facility] Wyatt Portela,  MD as Consulting Physician (Oncology) [facility] as Consulting Physician Madelin Rear, [facility] (Inactive) as Pharmacist (Pharmacist)  Recent office visits:  06/23/2021 [facility] (PCP) Leamon Arnt, MD; no medication changes indicated   Recent consult visits:  05/26/2021 OV (Cardiology) [name], MD; no medication changes indicated.   Hospital visits:  None in previous 6 months   Objective:  Lab Results  Component Value Date   CREATININE 1.59 (H) 12/26/2021   BUN 21 12/26/2021   GFR 45.91 (L) 06/23/2021   EGFR 66 (L) 06/12/2016   GFRNONAA 43 (L) 12/26/2021   GFRAA 43 (L) 04/11/2020   NA 140 12/26/2021   K 4.3 12/26/2021   CALCIUM 9.4 12/26/2021   CO2 29 12/26/2021   GLUCOSE 93 12/26/2021    Lab Results  Component Value Date/Time   HGBA1C 5.7 (H) 02/16/2018 11:18 AM   HGBA1C  10/19/2008 05:25 AM    6.1 (NOTE) The ADA recommends the following therapeutic goal for glycemic control related to Hgb A1c measurement: Goal of therapy: <6.5 Hgb A1c  Reference: American Diabetes Association: Clinical Practice Recommendations 2010, Diabetes Care, 2010, 33: (Suppl  1).   GFR 45.91 (L) 06/23/2021 03:14 PM   GFR 36.50 (L) 05/28/2020 10:42 AM    Last diabetic Eye exam: No results found for: "HMDIABEYEEXA"  Last diabetic Foot exam: No results found for: "HMDIABFOOTEX"   Lab Results  Component Value Date   CHOL 110 06/23/2021   HDL 40.60 06/23/2021   LDLCALC 58 06/23/2021   TRIG 54.0 06/23/2021   CHOLHDL 3 06/23/2021       Latest Ref Rng & Units 12/26/2021   10:09 AM 06/23/2021  3:14 PM 12/27/2020    1:11 PM  Hepatic Function  Total Protein 6.5 - 8.1 g/dL 7.2  7.1  7.5   Albumin 3.5 - 5.0 g/dL 3.7  3.6  3.7   AST 15 - 41 U/L 23  32  25   ALT 0 - 44 U/L _0 Alk Phosphatase 38 - 126 U/L 74  77  74   Total Bilirubin 0.3 - 1.2 mg/dL 0.5  0.4  0.6     Lab Results  Component Value Date/Time   TSH 1.31 06/23/2021 03:14 PM   TSH 1.00 05/28/2020 10:42 AM    FREET4 1.28 10/19/2008 01:15 PM       Latest Ref Rng & Units 12/26/2021   10:09 AM 06/23/2021    3:14 PM 12/27/2020    1:11 PM  CBC  WBC 4.0 - 10.5 K/uL 5.2  6.2  5.6   Hemoglobin 13.0 - 17.0 g/dL 11.3  10.4  10.1   Hematocrit 39.0 - 52.0 % 33.5  30.5  29.6   Platelets 150 - 400 K/uL 174  178.0  194     Lab Results  Component Value Date/Time   VITAMINB12 265 07/05/2015 08:51 AM    Clinical ASCVD: {YES/NO:21197} The ASCVD Risk score (Arnett DK, et al., 2019) failed to calculate for the following reasons:   The 2019 ASCVD risk score is only valid for ages 42 to 69       12/23/2021    9:23 AM 06/23/2021    2:46 PM 05/28/2020   10:06 AM  Depression screen PHQ 2/9  Decreased Interest 0 0 0  Down, Depressed, Hopeless 0 0 0  PHQ - 2 Score 0 0 0     ***Other: (CHADS2VASc if Afib, MMRC or CAT for COPD, ACT, DEXA)  Social History   Tobacco Use  Smoking Status Never  Smokeless Tobacco Never   BP Readings from Last 3 Encounters:  12/26/21 (!) 106/56  12/23/21 126/64  06/23/21 (!) 124/58   Pulse Readings from Last 3 Encounters:  12/26/21 70  12/23/21 75  06/23/21 78   Wt Readings from Last 3 Encounters:  12/26/21 146 lb 14.4 oz (66.6 kg)  12/23/21 150 lb 3.2 oz (68.1 kg)  06/23/21 148 lb 9.6 oz (67.4 kg)   BMI Readings from Last 3 Encounters:  12/26/21 21.08 kg/m  12/23/21 21.55 kg/m  06/23/21 21.32 kg/m    Assessment/Interventions: Review of patient past medical history, allergies, medications, health status, including review of consultants reports, laboratory and other test data, was performed as part of comprehensive evaluation and provision of chronic care management services.   SDOH:  (Social Determinants of Health) assessments and interventions performed: {yes/no:20286} SDOH Interventions    Flowsheet Row Chronic Care Management from 10/31/2019 in [place] Interventions Other (Comment)   Transportation Interventions Other (Comment)      [place]: No Food Insecurity (10/31/2019)  Transportation Needs: No Transportation Needs (10/31/2019)  Depression (PHQ2-9): Low Risk  (12/23/2021)  Physical Activity: Insufficiently Active (10/18/2017)  Tobacco Use: Low Risk  (12/23/2021)    CCM Care Plan  Allergies  Allergen Reactions   Ace Inhibitors Swelling    Facial swelling    Entresto [Sacubitril-Valsartan] Swelling    Angioedema with ACE Inhibitor    Medications Reviewed Today     Reviewed by Leamon Arnt, MD (Physician) on 12/23/21 at 0931  Med List Status: <  None>   Medication Order Taking? Sig Documenting Provider Last Dose Status Informant  acetic acid 2 % otic solution [id number] Yes  [provider] Taking Active   aspirin 81 MG EC tablet [id number] Yes Take 1 tablet (81 mg total) by mouth daily. Almyra Deforest, [place] Taking Active   carvedilol (COREG) 6.25 MG tablet [id number] Yes Take 0.5 tablets (3.125 mg total) by mouth 2 (two) times daily with a meal. Leamon Arnt, MD Taking Active   ezetimibe (ZETIA) 10 MG tablet [id number]  Take 1 tablet (10 mg total) by mouth daily. Larey Dresser, MD  Expired 05/26/21 2359   feeding supplement, ENSURE ENLIVE, (ENSURE ENLIVE) LIQD [id number] Yes Take 237 mLs by mouth 3 (three) times daily between meals. Lavina Hamman, MD Taking Active Self  Ferrous Gluconate 324 (37.5 Fe) MG TABS [id number] Yes TAKE 1 TABLET BY MOUTH TWICE DAILY WITH MEALS Shadad, Mathis Dad, MD Taking Active   furosemide (LASIX) 20 MG tablet [id number] Yes Take 20 mg by mouth daily. [provider] Taking Active Self  latanoprost (XALATAN) 0.005 % ophthalmic solution [id number] Yes Place 1 drop into both eyes daily. [provider] Taking Active Self  rosuvastatin (CRESTOR) 40 MG tablet [id number] Yes TAKE 1 TABLET BY MOUTH ONCE DAILY AT  Felicity Pellegrini, MD Taking Active   spironolactone (ALDACTONE) 25 MG tablet  [id number]  Take 1 tablet (25 mg total) by mouth daily. Larey Dresser, MD  Expired 05/26/21 2359   traZODone (DESYREL) 150 MG tablet [id number] Yes Take 150 mg by mouth at bedtime as needed for sleep.  [provider] Taking Active Self            Patient Active Problem List   Diagnosis Date Noted   Exposure to Agent [facility] 06/23/2021   S/P CABG x 3 02/17/2018   CAD (coronary artery disease) 56/38/9373   Systolic HF (heart failure) ([place]) 02/08/2018   Nonischemic cardiomyopathy ([place]) 07/03/2015   Anemia, chronic disease 07/01/2015   Primary osteoarthritis of knees, bilateral 12/10/2014   MGUS (monoclonal gammopathy of unknown significance) 06/30/2010   Chronic kidney disease (CKD), stage III (moderate) ([place]) 03/24/2010   Benign essential hypertension 10/31/2008   Mixed hyperlipidemia 10/30/2008    Immunization History  Administered Date(s) Administered   Fluad Quad(high Dose 65+) 10/31/2018, 11/28/2020, 12/23/2021   Influenza Split 11/03/2010   Influenza, High Dose Seasonal PF 11/30/2011, 12/06/2013, 12/10/2014, 11/04/2015, 11/08/2015, 12/20/2015, 12/16/2016, 02/10/2018   Influenza,inj,Quad PF,6+ Mos 12/05/2012   Influenza-Unspecified 01/31/2017, 11/13/2018, 12/07/2020   Moderna Covid-19 Vaccine Bivalent Booster 76yr & up 03/06/2021   Moderna Sars-Covid-2 Vaccination 04/20/2019, 05/18/2019, 01/25/2020, 07/22/2020   Pneumococcal Conjugate-13 12/06/2013   Pneumococcal Polysaccharide-23 03/09/2008   Tdap 12/03/2008, 12/04/2014   Zoster Recombinat (Shingrix) 06/10/2021   Zoster, Live 03/09/2008    Conditions to be addressed/monitored:  HTN, CAD, HF, HLD  There are no care plans that you recently modified to display for this patient.    Medication Assistance: {MEDASSISTANCEINFO:25044}  Compliance/Adherence/Medication fill history: Care Gaps: ***  Star-Rating Drugs: ***  [facility] [place]Phone: [phone]: [phone] Uses pill box? {Yes or If no, why not?:20788} Pt endorses ***% compliance  We discussed: {Pharmacy options:24294} Patient decided to: {US Pharmacy Plan:23885}  Care Plan and Follow Up Patient Decision:  {FOLLOWUP:24991}  Plan: {CM FOLLOW UP PTXMI:68032} *** Current Barriers:  {pharmacybarriers:24917}  Pharmacist Clinical Goal(s):  Patient will {PHARMACYGOALCHOICES:24921} through collaboration with PharmD and provider.   Interventions: 1:1 collaboration with Leamon Arnt, MD regarding development and update of comprehensive plan of care as evidenced by provider attestation and co-signature Inter-disciplinary care team collaboration (see longitudinal plan of care) Comprehensive medication review performed; medication list updated in electronic medical record  Hypertension (BP goal {CHL HP UPSTREAM Pharmacist BP ranges:[phone]}) -{US controlled/uncontrolled:25276} -Current treatment: *** -Medications previously tried: ***  -Current home readings: *** -Current dietary habits: *** -Current exercise habits: *** -{ACTIONS;DENIES/REPORTS:[id number]::"Denies"} hypotensive/hypertensive symptoms -Educated on {CCM BP Counseling:25124} -Counseled to monitor BP at home ***, document, and provide log at future appointments -{CCMPHARMDINTERVENTION:25122}  Hyperlipidemia: (LDL goal < ***) -{US controlled/uncontrolled:25276} -Current treatment: *** -Medications previously tried: ***  -Current dietary patterns: *** -Current exercise habits: *** -Educated on {CCM HLD Counseling:25126} -{CCMPHARMDINTERVENTION:25122}  Heart Failure (Goal: manage symptoms and prevent exacerbations) -{US controlled/uncontrolled:25276} -Last ejection fraction: *** (Date: ***) -HF type: {type of heart failure:28376} -NYHA Class: {CHL HP Upstream Pharm NYHA Class:[phone]} -AHA HF Stage: {CHL HP Upstream Pharm  AHA HF Stage:[phone]} -Current treatment: *** -Medications previously tried: ***  -Current home BP/HR readings: *** -Current home daily weights: *** -Current dietary habits: *** -Current exercise habits: *** -Educated on {CCM HF Counseling:25125} -{CCMPHARMDINTERVENTION:25122}  Patient Goals/Self-Care Activities Patient will:  - {pharmacypatientgoals:24919}  Follow Up Plan: {CM FOLLOW UP KKXF:81829}

## 2022-02-17 ENCOUNTER — Telehealth: Payer: Medicare Other

## 2022-02-17 ENCOUNTER — Ambulatory Visit: Payer: Medicare Other | Admitting: Pharmacist

## 2022-02-17 DIAGNOSIS — I5022 Chronic systolic (congestive) heart failure: Secondary | ICD-10-CM

## 2022-02-17 DIAGNOSIS — I129 Hypertensive chronic kidney disease with stage 1 through stage 4 chronic kidney disease, or unspecified chronic kidney disease: Secondary | ICD-10-CM | POA: Diagnosis not present

## 2022-02-17 DIAGNOSIS — D631 Anemia in chronic kidney disease: Secondary | ICD-10-CM | POA: Diagnosis not present

## 2022-02-17 DIAGNOSIS — N1831 Chronic kidney disease, stage 3a: Secondary | ICD-10-CM | POA: Diagnosis not present

## 2022-02-17 DIAGNOSIS — I429 Cardiomyopathy, unspecified: Secondary | ICD-10-CM | POA: Diagnosis not present

## 2022-02-17 DIAGNOSIS — E782 Mixed hyperlipidemia: Secondary | ICD-10-CM

## 2022-02-17 DIAGNOSIS — D472 Monoclonal gammopathy: Secondary | ICD-10-CM | POA: Diagnosis not present

## 2022-02-17 NOTE — Patient Instructions (Addendum)
Visit Information   Goals Addressed   None    Patient Care Plan: General Pharmacy (Adult)     Problem Identified: HF, HLD      Goal: Patient-Specific Goal   Note:   Current Barriers:  [place] GFR for Crestor  Pharmacist Clinical Goal(s):  Patient will maintain control of BP, LDL as evidenced by labs  through collaboration with PharmD and provider.   Interventions: 1:1 collaboration with Leamon Arnt, MD regarding development and update of comprehensive plan of care as evidenced by provider attestation and co-signature Inter-disciplinary care team collaboration (see longitudinal plan of care) Comprehensive medication review performed; medication list updated in electronic medical record  Hyperlipidemia: (LDL goal < 70) 02/17/22 -Controlled -Current treatment: Rosuvastatin '40mg'$  Appropriate, Effective, Query Safe,  -Medications previously tried: none noted  -Current exercise habits: he is still bowling a few times per week and doing virtual exercise classes through the [place] a few times per week.  He really enjoys these and keeps him active -Educated on Cholesterol goals;  Benefits of statin for ASCVD risk reduction; Importance of limiting foods high in cholesterol; Exercise goal of 150 minutes per week; -Recommended to continue current medication -Consider - GFR 43 most recently.  Crestor max dose is '10mg'$  once drops below 30.  Could go ahead and switch to Atorvastatin.  He reportedly saw his kidney doctor today and had labs done will FU on this.  May be something to monitor for now.  Heart Failure (Goal: manage symptoms and prevent exacerbations) 02/17/22 -Controlled -Last ejection fraction: 2020 (Date: 50-55%) -HF type: HFimpEF (EF improved from <40% to > 40%) -NYHA Class: I (no actitivty limitation) + -AHA HF Stage: B (Heart disease present - no symptoms present) -Current treatment: Carvedilol 6.'25mg'$  one-half tab bid Appropriate, Effective, Safe,  Accessible Furosemide '20mg'$  daily Appropriate, Effective, Safe, Accessible Spironolactone '25mg'$  Appropriate, Effective, Safe, Accessible -Medications previously tried: none noted  -Current home BP/HR readings: controlled, he checks at home daily -Current home daily weights: 150 today - denies any recent swelling -Current exercise habits: same see above -Educated on Benefits of medications for managing symptoms and prolonging life Importance of weighing daily; if you gain more than 3 pounds in one day or 5 pounds in one week, contact providers -Recommended to continue current medication Stay hydrated to protect kidneys, continue to be as active as you can!!  Patient Goals/Self-Care Activities Patient will:  - take medications as prescribed as evidenced by patient report and record review check blood pressure daily, document, and provide at future appointments  Follow Up Plan: The care management team will reach out to the patient again over the next 180 days.       The patient verbalized understanding of instructions, educational materials, and care plan provided today and DECLINED offer to receive copy of patient instructions, educational materials, and care plan.  Telephone follow up appointment with pharmacy team member scheduled for: 6 months  Edythe Clarity, [place], PharmD Clinical Pharmacist  [facility] [phone]

## 2022-02-17 NOTE — Progress Notes (Signed)
Chronic Care Management Pharmacy Note  02/17/2022 Name:  Luis Chen MRN:  [id number] DOB:  11-27-1940  Summary: PharmD FU visit.  He is still very active with bowling and his virtual exercise.  Gets all meds from [place].  Saw kidney doctor today and had labs.  Need to monitor GFR and Crestor (max dose is 18m once GFR drops < 30)  Recommendations/Changes made from today's visit: No changes at this time - continue current meds, monitor GFR.  Plan: FU in 6 months CMA to update GFR in 30 days   Subjective: FBRITAIN SABERis an 81y.o. year old male who is a primary patient of ALeamon Arnt MD.  The CCM team was consulted for assistance with disease management and care coordination needs.    Engaged with patient by telephone for follow up visit in response to provider referral for pharmacy case management and/or care coordination services.   Consent to Services:  The patient was given the following information about Chronic Care Management services today, agreed to services, and gave verbal consent: 1. CCM service includes personalized support from designated clinical staff supervised by the primary care provider, including individualized plan of care and coordination with other care providers 2. 24/7 contact phone numbers for assistance for urgent and routine care needs. 3. Service will only be billed when office clinical staff spend 20 minutes or more in a month to coordinate care. 4. Only one practitioner may furnish and bill the service in a calendar month. 5.The patient may stop CCM services at any time (effective at the end of the month) by phone call to the office staff. 6. The patient will be responsible for cost sharing (co-pay) of up to 20% of the service fee (after annual deductible is met). Patient agreed to services and consent obtained.  Patient Care Team: ALeamon Arnt MD as PCP - General (Family Medicine) MLarey Dresser MD as PCP - Advanced Heart Failure  (Cardiology) HMinus Breeding MD as PCP - Cardiology (Cardiology) MRenette Butters MD as Attending Physician (Orthopedic Surgery) CDonato Heinz MD as Consulting Physician (Nephrology) SCaryl Pina MD (Hematology) HMinus Breeding MD as Consulting Physician (Cardiology) [facility] SWyatt Portela MD as Consulting Physician (Oncology) Clinic, KThayer Dallasas Consulting Physician DEdythe Clarity [facility](Pharmacist)  Recent office visits:  06/23/2021 [facility] (PCP) ALeamon Arnt MD; no medication changes indicated   Recent consult visits:  05/26/2021 [facility] (Cardiology) HMinus Breeding MD; no medication changes indicated.   Hospital visits:  None in previous 6 months   Objective:  Lab Results  Component Value Date   CREATININE 1.59 (H) 12/26/2021   BUN 21 12/26/2021   GFR 45.91 (L) 06/23/2021   EGFR 66 (L) 06/12/2016   GFRNONAA 43 (L) 12/26/2021   GFRAA 43 (L) 04/11/2020   NA 140 12/26/2021   K 4.3 12/26/2021   CALCIUM 9.4 12/26/2021   CO2 29 12/26/2021   GLUCOSE 93 12/26/2021    Lab Results  Component Value Date/Time   HGBA1C 5.7 (H) 02/16/2018 11:18 AM   HGBA1C  10/19/2008 05:25 AM    6.1 (NOTE) The ADA recommends the following therapeutic goal for glycemic control related to Hgb A1c measurement: Goal of therapy: <6.5 Hgb A1c  Reference: American Diabetes Association: Clinical Practice Recommendations 2010, Diabetes Care, 2010, 33: (Suppl  1).   GFR 45.91 (L) 06/23/2021 03:14 PM   GFR 36.50 (L) 05/28/2020 10:42 AM    Last diabetic Eye exam: No results found  for: "HMDIABEYEEXA"  Last diabetic Foot exam: No results found for: "HMDIABFOOTEX"   Lab Results  Component Value Date   CHOL 110 06/23/2021   HDL 40.60 06/23/2021   LDLCALC 58 06/23/2021   TRIG 54.0 06/23/2021   CHOLHDL 3 06/23/2021       Latest Ref Rng & Units 12/26/2021   10:09 AM 06/23/2021    3:14 PM 12/27/2020    1:11 PM  Hepatic Function  Total Protein 6.5 - 8.1 g/dL 7.2  7.1  7.5    Albumin 3.5 - 5.0 g/dL 3.7  3.6  3.7   AST 15 - 41 U/L 23  32  25   ALT 0 - 44 U/L _0 Alk Phosphatase 38 - 126 U/L 74  77  74   Total Bilirubin 0.3 - 1.2 mg/dL 0.5  0.4  0.6     Lab Results  Component Value Date/Time   TSH 1.31 06/23/2021 03:14 PM   TSH 1.00 05/28/2020 10:42 AM   FREET4 1.28 10/19/2008 01:15 PM       Latest Ref Rng & Units 12/26/2021   10:09 AM 06/23/2021    3:14 PM 12/27/2020    1:11 PM  CBC  WBC 4.0 - 10.5 K/uL 5.2  6.2  5.6   Hemoglobin 13.0 - 17.0 g/dL 11.3  10.4  10.1   Hematocrit 39.0 - 52.0 % 33.5  30.5  29.6   Platelets 150 - 400 K/uL 174  178.0  194     Lab Results  Component Value Date/Time   VITAMINB12 265 07/05/2015 08:51 AM    Clinical ASCVD: No  The ASCVD Risk score (Arnett DK, et al., 2019) failed to calculate for the following reasons:   The 2019 ASCVD risk score is only valid for ages 28 to 68       12/23/2021    9:23 AM 06/23/2021    2:46 PM 05/28/2020   10:06 AM  Depression screen PHQ 2/9  Decreased Interest 0 0 0  Down, Depressed, Hopeless 0 0 0  PHQ - 2 Score 0 0 0      Social History   Tobacco Use  Smoking Status Never  Smokeless Tobacco Never   BP Readings from Last 3 Encounters:  12/26/21 (!) 106/56  12/23/21 126/64  06/23/21 (!) 124/58   Pulse Readings from Last 3 Encounters:  12/26/21 70  12/23/21 75  06/23/21 78   Wt Readings from Last 3 Encounters:  12/26/21 146 lb 14.4 oz (66.6 kg)  12/23/21 150 lb 3.2 oz (68.1 kg)  06/23/21 148 lb 9.6 oz (67.4 kg)   BMI Readings from Last 3 Encounters:  12/26/21 21.08 kg/m  12/23/21 21.55 kg/m  06/23/21 21.32 kg/m    Assessment/Interventions: Review of patient past medical history, allergies, medications, health status, including review of consultants reports, laboratory and other test data, was performed as part of comprehensive evaluation and provision of chronic care management services.   SDOH:  (Social Determinants of Health) assessments and  interventions performed: Yes Financial Resource Strain: Not on file   Food Insecurity: No Food Insecurity (10/31/2019)   Hunger Vital Sign    Worried About Running Out of Food in the Last Year: Never true    Ran Out of Food in the Last Year: Never true    SDOH Interventions    Flowsheet Row Chronic Care Management from 10/31/2019 in [place] Interventions Other (Comment)  Transportation Interventions Other (Comment)      SDOH Screenings   Food Insecurity: No Food Insecurity (10/31/2019)  Transportation Needs: No Transportation Needs (10/31/2019)  Depression (PHQ2-9): Low Risk  (12/23/2021)  Physical Activity: Insufficiently Active (10/18/2017)  Tobacco Use: Low Risk  (12/23/2021)    CCM Care Plan  Allergies  Allergen Reactions   Ace Inhibitors Swelling    Facial swelling    Entresto [Sacubitril-Valsartan] Swelling    Angioedema with ACE Inhibitor    Medications Reviewed Today     Reviewed by Edythe Clarity, [facility] (Pharmacist) on 02/17/22 at 58  Med List Status: <None>   Medication Order Taking? Sig Documenting Provider Last Dose Status Informant  acetic acid 2 % otic solution [id number] Yes  [provider] Taking Active   aspirin 81 MG EC tablet [id number] Yes Take 1 tablet (81 mg total) by mouth daily. Almyra Deforest, [place] Taking Active   carvedilol (COREG) 6.25 MG tablet [id number] Yes Take 0.5 tablets (3.125 mg total) by mouth 2 (two) times daily with a meal. Leamon Arnt, MD Taking Active   ezetimibe (ZETIA) 10 MG tablet [id number]  Take 1 tablet (10 mg total) by mouth daily. Larey Dresser, MD  Expired 05/26/21 2359   feeding supplement, ENSURE ENLIVE, (ENSURE ENLIVE) LIQD [id number] Yes Take 237 mLs by mouth 3 (three) times daily between meals. Lavina Hamman, MD Taking Active Self  Ferrous Gluconate 324 (37.5 Fe) MG TABS [id number] Yes TAKE 1 TABLET BY MOUTH TWICE DAILY WITH MEALS Shadad, Mathis Dad,  MD Taking Active   furosemide (LASIX) 20 MG tablet [id number] Yes Take 20 mg by mouth daily. [provider] Taking Active Self  latanoprost (XALATAN) 0.005 % ophthalmic solution [id number] Yes Place 1 drop into both eyes daily. [provider] Taking Active Self  rosuvastatin (CRESTOR) 40 MG tablet [id number] Yes TAKE 1 TABLET BY MOUTH ONCE DAILY AT  Felicity Pellegrini, MD Taking Active   spironolactone (ALDACTONE) 25 MG tablet [id number]  Take 1 tablet (25 mg total) by mouth daily. Larey Dresser, MD  Expired 05/26/21 2359   traZODone (DESYREL) 150 MG tablet [id number] Yes Take 150 mg by mouth at bedtime as needed for sleep.  [provider] Taking Active Self            Patient Active Problem List   Diagnosis Date Noted   Exposure to Agent [facility] 06/23/2021   S/P CABG x 3 02/17/2018   CAD (coronary artery disease) 79/04/4095   Systolic HF (heart failure) ([place]) 02/08/2018   Nonischemic cardiomyopathy ([place]) 07/03/2015   Anemia, chronic disease 07/01/2015   Primary osteoarthritis of knees, bilateral 12/10/2014   MGUS (monoclonal gammopathy of unknown significance) 06/30/2010   Chronic kidney disease (CKD), stage III (moderate) ([place]) 03/24/2010   Benign essential hypertension 10/31/2008   Mixed hyperlipidemia 10/30/2008    Immunization History  Administered Date(s) Administered   Fluad Quad(high Dose 65+) 10/31/2018, 11/28/2020, 12/23/2021   Influenza Split 11/03/2010   Influenza, High Dose Seasonal PF 11/30/2011, 12/06/2013, 12/10/2014, 11/04/2015, 11/08/2015, 12/20/2015, 12/16/2016, 02/10/2018   Influenza,inj,Quad PF,6+ Mos 12/05/2012   Influenza-Unspecified 01/31/2017, 11/13/2018, 12/07/2020   Moderna Covid-19 Vaccine Bivalent Booster 68yr & up 03/06/2021   Moderna Sars-Covid-2 Vaccination 04/20/2019, 05/18/2019, 01/25/2020, 07/22/2020   Pneumococcal Conjugate-13 12/06/2013   Pneumococcal Polysaccharide-23 03/09/2008   Tdap 12/03/2008, 12/04/2014    Zoster Recombinat (Shingrix) 06/10/2021   Zoster, Live 03/09/2008    Conditions to be addressed/monitored:  HTN, CAD, HF, CKD, HLD  Care Plan :  General Pharmacy (Adult)  Updates made by Edythe Clarity, RPH since 02/17/2022 12:00 AM     Problem: HF, HLD      Goal: Patient-Specific Goal   Note:   Current Barriers:  [place] GFR for Crestor  Pharmacist Clinical Goal(s):  Patient will maintain control of BP, LDL as evidenced by labs  through collaboration with PharmD and provider.   Interventions: 1:1 collaboration with Leamon Arnt, MD regarding development and update of comprehensive plan of care as evidenced by provider attestation and co-signature Inter-disciplinary care team collaboration (see longitudinal plan of care) Comprehensive medication review performed; medication list updated in electronic medical record  Hyperlipidemia: (LDL goal < 70) 02/17/22 -Controlled -Current treatment: Rosuvastatin 64m Appropriate, Effective, Query Safe,  -Medications previously tried: none noted  -Current exercise habits: he is still bowling a few times per week and [place]  He really enjoys these and keeps him active -Educated on Cholesterol goals;  Benefits of statin for ASCVD risk reduction; Importance of limiting foods high in cholesterol; Exercise goal of 150 minutes per week; -Recommended to continue current medication -Consider - GFR 43 most recently.  Crestor max dose is 169monce drops below 30.  Could go ahead and switch to Atorvastatin.  He reportedly saw his kidney doctor today and had labs done will FU on this.  May be something to monitor for now.  Heart Failure (Goal: manage symptoms and prevent exacerbations) 02/17/22 -Controlled -Last ejection fraction: 2020 (Date: 50-55%) -HF type: HFimpEF (EF improved from <40% to > 40%) -NYHA Class: I (no actitivty limitation) + -AHA HF Stage: B (Heart disease  present - no symptoms present) -Current treatment: Carvedilol 6.2564mne-half tab bid Appropriate, Effective, Safe, Accessible Furosemide 83m37mily Appropriate, Effective, Safe, Accessible Spironolactone 25mg4mropriate, Effective, Safe, Accessible -Medications previously tried: none noted  -Current home BP/HR readings: controlled, he checks at home daily -Current home daily weights: 150 today - denies any recent swelling -Current exercise habits: same see above -Educated on Benefits of medications for managing symptoms and prolonging life Importance of weighing daily; if you gain more than 3 pounds in one day or 5 pounds in one week, contact providers -Recommended to continue current medication Stay hydrated to protect kidneys, continue to be as active as you can!!  Patient Goals/Self-Care Activities Patient will:  - take medications as prescribed as evidenced by patient report and record review check blood pressure daily, document, and provide at future appointments  Follow Up Plan: The care management team will reach out to the patient again over the next 180 days.       Medication Assistance: None required.  Patient affirms current coverage meets needs.  Compliance/Adherence/Medication fill history: Care Gaps: AWV needed  Star[facility]Rating [facility]  [place] [place] [id number]: [phone] pill box? Yes Pt endorses 100% compliance  We discussed: Benefits of medication synchronization, packaging and delivery as well as enhanced pharmacist oversight with [facility]. Patient decided to: Utilize [facility] pharmacy for medication synchronization, packaging and delivery  Care Plan and Follow Up Patient Decision:  Patient agrees to Care Plan and Follow-up.  Plan: The care management team will reach out to the patient again over  the next 180 days.  ChrisBeverly MilchrmD Clinical Pharmacist  [facility])[phone]

## 2022-03-19 ENCOUNTER — Telehealth: Payer: Self-pay | Admitting: Physician Assistant

## 2022-03-19 NOTE — Telephone Encounter (Signed)
Called patient per Dr. Alen Blew transition. Patient r/s w/ new provider.

## 2022-03-24 ENCOUNTER — Ambulatory Visit: Payer: Medicare Other

## 2022-03-24 VITALS — Wt 150.0 lb

## 2022-03-24 DIAGNOSIS — Z0000 Encounter for general adult medical examination without abnormal findings: Secondary | ICD-10-CM | POA: Diagnosis not present

## 2022-03-24 NOTE — Patient Instructions (Signed)
[name] , Thank you for taking time to come for your Medicare Wellness Visit. I appreciate your ongoing commitment to your health goals. Please review the following plan we discussed and let me know if I can assist you in the future.   These are the goals we discussed:  Goals      Patient Stated     Maintain current health by staying active.      Patient Stated     Stay healthy and active      [place] (see longitudinal plan of care for additional care plan information)  Current Barriers:  Chronic Disease Management support, education, and care coordination needs related to Hypertension and Hyperlipidemia   Hypertension / Heart Failure BP Readings from Last 3 Encounters:  07/27/19 120/72  05/05/19 110/62  01/18/19 (!) 103/58  Pharmacist Clinical Goal(s): Over the next 180 days, patient will work with PharmD and providers to maintain BP goal <130/80 Current regimen:  Losartan 50 mg twice daily Spironolactone 25 mg once daily Carvedilol 6.25 mg twice daily Interventions: Diet/exercise recommendations Getting back to the [employer] Patient self care activities - Over the next 180 days, patient will: Check BP at elast once every 1-2 weeks, document, and provide at future appointments Ensure daily salt intake < 2300 mg/day  Hyperlipidemia Lab Results  Component Value Date/Time   LDLCALC 75 05/05/2019 08:49 AM  Pharmacist Clinical Goal(s): Over the next 180 days, patient will work with PharmD and providers to achieve LDL goal < 70 Current regimen:  Rosuvastatin 40 mg once daily Zetia 10 mg once daily Interventions: Continue current management Patient self care activities - Over the next 180 days, patient will: Continue current management Medication management Pharmacist Clinical Goal(s): Over the next 180 days, patient will work with PharmD and providers to achieve optimal medication adherence Current pharmacy: [facility] Interventions Comprehensive medication review performed. Continue current medication management strategy Patient self care activities - Over the next 180 days, patient will: Take medications as prescribed Report any questions or concerns to PharmD and/or provider(s) Initial goal documentation.        This is a list of the screening recommended for you and due dates:  Health Maintenance  Topic Date Due   COVID-19 Vaccine (6 - 2023-24 season) 11/07/2021   Medicare Annual Wellness Visit  03/25/2023   DTaP/Tdap/Td vaccine (3 - Td or Tdap) 12/03/2024   Pneumonia Vaccine  Completed   Flu Shot  Completed   Zoster (Shingles) Vaccine  Completed   HPV Vaccine  Aged Out    Advanced directives: Please bring a copy of your health care power of attorney and living will to the office at your convenience.  Conditions/risks identified: stay active and healthy   Next appointment: Follow up in one year for your annual wellness visit.   Preventive Care 66 Years and Older, Male  Preventive care refers to lifestyle choices and visits with your health care provider that can promote health and wellness. What does preventive care include? A yearly physical exam. This is also called an annual well check. Dental exams once or twice a year. Routine eye exams. Ask your health care provider how often you should have your eyes checked. Personal lifestyle choices, including: Daily care of your teeth and gums. Regular physical activity. Eating a healthy diet. Avoiding tobacco and drug use. Limiting alcohol use. Practicing safe sex. Taking low doses of aspirin every day. Taking vitamin and mineral supplements  as recommended by your health care provider. What happens during an annual well check? The services and screenings done by your health care provider during your annual well check will depend on your age, overall health, lifestyle risk factors, and family history of disease. Counseling   Your health care provider may ask you questions about your: Alcohol use. Tobacco use. Drug use. Emotional well-being. Home and relationship well-being. Sexual activity. Eating habits. History of falls. Memory and ability to understand (cognition). Work and work Statistician. Screening  You may have the following tests or measurements: Height, weight, and BMI. Blood pressure. Lipid and cholesterol levels. These may be checked every 5 years, or more frequently if you are over 52 years old. Skin check. Lung cancer screening. You may have this screening every year starting at age 30 if you have a 30-pack-year history of smoking and currently smoke or have quit within the past 15 years. Fecal occult blood test (FOBT) of the stool. You may have this test every year starting at age 65. Flexible sigmoidoscopy or colonoscopy. You may have a sigmoidoscopy every 5 years or a colonoscopy every 10 years starting at age 4. Prostate cancer screening. Recommendations will vary depending on your family history and other risks. Hepatitis C blood test. Hepatitis B blood test. Sexually transmitted disease (STD) testing. Diabetes screening. This is done by checking your blood sugar (glucose) after you have not eaten for a while (fasting). You may have this done every 1-3 years. Abdominal aortic aneurysm (AAA) screening. You may need this if you are a current or former smoker. Osteoporosis. You may be screened starting at age 49 if you are at high risk. Talk with your health care provider about your test results, treatment options, and if necessary, the need for more tests. Vaccines  Your health care provider may recommend certain vaccines, such as: Influenza vaccine. This is recommended every year. Tetanus, diphtheria, and acellular pertussis (Tdap, Td) vaccine. You may need a Td booster every 10 years. Zoster vaccine. You may need this after age 26. Pneumococcal 13-valent conjugate (PCV13) vaccine.  One dose is recommended after age 79. Pneumococcal polysaccharide (PPSV23) vaccine. One dose is recommended after age 29. Talk to your health care provider about which screenings and vaccines you need and how often you need them. This information is not intended to replace advice given to you by your health care provider. Make sure you discuss any questions you have with your health care provider. Document Released: 03/22/2015 Document Revised: 11/13/2015 Document Reviewed: 12/25/2014 Elsevier Interactive Patient Education  2017 [place] Prevention in the Home Falls can cause injuries. They can happen to people of all ages. There are many things you can do to make your home safe and to help prevent falls. What can I do on the outside of my home? Regularly fix the edges of walkways and driveways and fix any cracks. Remove anything that might make you trip as you walk through a door, such as a raised step or threshold. Trim any bushes or trees on the path to your home. Use bright outdoor lighting. Clear any walking paths of anything that might make someone trip, such as rocks or tools. Regularly check to see if handrails are loose or broken. Make sure that both sides of any steps have handrails. Any raised decks and porches should have guardrails on the edges. Have any leaves, snow, or ice cleared regularly. Use sand or salt on walking paths during winter. Clean up any spills  in your garage right away. This includes oil or grease spills. What can I do in the bathroom? Use night lights. Install grab bars by the toilet and in the tub and shower. Do not use towel bars as grab bars. Use non-skid mats or decals in the tub or shower. If you need to sit down in the shower, use a plastic, non-slip stool. Keep the floor dry. Clean up any water that spills on the floor as soon as it happens. Remove soap buildup in the tub or shower regularly. Attach bath mats securely with double-sided  non-slip rug tape. Do not have throw rugs and other things on the floor that can make you trip. What can I do in the bedroom? Use night lights. Make sure that you have a light by your bed that is easy to reach. Do not use any sheets or blankets that are too big for your bed. They should not hang down onto the floor. Have a firm chair that has side arms. You can use this for support while you get dressed. Do not have throw rugs and other things on the floor that can make you trip. What can I do in the kitchen? Clean up any spills right away. Avoid walking on wet floors. Keep items that you use a lot in easy-to-reach places. If you need to reach something above you, use a strong step stool that has a grab bar. Keep electrical cords out of the way. Do not use floor polish or wax that makes floors slippery. If you must use wax, use non-skid floor wax. Do not have throw rugs and other things on the floor that can make you trip. What can I do with my stairs? Do not leave any items on the stairs. Make sure that there are handrails on both sides of the stairs and use them. Fix handrails that are broken or loose. Make sure that handrails are as long as the stairways. Check any carpeting to make sure that it is firmly attached to the stairs. Fix any carpet that is loose or worn. Avoid having throw rugs at the top or bottom of the stairs. If you do have throw rugs, attach them to the floor with carpet tape. Make sure that you have a light switch at the top of the stairs and the bottom of the stairs. If you do not have them, ask someone to add them for you. What else can I do to help prevent falls? Wear shoes that: Do not have high heels. Have rubber bottoms. Are comfortable and fit you well. Are closed at the toe. Do not wear sandals. If you use a stepladder: Make sure that it is fully opened. Do not climb a closed stepladder. Make sure that both sides of the stepladder are locked into place. Ask  someone to hold it for you, if possible. Clearly mark and make sure that you can see: Any grab bars or handrails. First and last steps. Where the edge of each step is. Use tools that help you move around (mobility aids) if they are needed. These include: Canes. Walkers. Scooters. Crutches. Turn on the lights when you go into a dark area. Replace any light bulbs as soon as they burn out. Set up your furniture so you have a clear path. Avoid moving your furniture around. If any of your floors are uneven, fix them. If there are any pets around you, be aware of where they are. Review your medicines with your doctor. Some  medicines can make you feel dizzy. This can increase your chance of falling. Ask your doctor what other things that you can do to help prevent falls. This information is not intended to replace advice given to you by your health care provider. Make sure you discuss any questions you have with your health care provider. Document Released: 12/20/2008 Document Revised: 08/01/2015 Document Reviewed: 03/30/2014 Elsevier Interactive Patient Education  2017 [employer].

## 2022-03-24 NOTE — Progress Notes (Signed)
I connected with  Luis Chen on 60/73/71 by a audio enabled telemedicine application and verified that I am speaking with the correct person using two identifiers.  Patient Location: Home  Provider Location: Office/Clinic  I discussed the limitations of evaluation and management by telemedicine. The patient expressed understanding and agreed to proceed.   Subjective:   Luis Chen is a 82 y.o. male who presents for Medicare Annual/Subsequent preventive examination.  Review of Systems     Cardiac Risk Factors include: advanced age (>5mn, >>79women);dyslipidemia;hypertension;male gender     Objective:    Today's Vitals   03/24/22 0817  Weight: 150 lb (68 kg)   Body mass index is 21.52 kg/m.     03/24/2022    8:25 AM 01/16/2019    8:20 AM 02/17/2018   10:00 PM 02/16/2018   11:11 AM 02/08/2018    7:27 AM 08/25/2017    8:30 AM 06/12/2016    1:26 PM  Advanced Directives  Does Patient Have a Medical Advance Directive? Yes No No No No No No  Type of AParamedicof ADanbyLiving will        Copy of [facility] [place] Chart? No - copy requested        Would patient like information on creating a medical advance directive?  No - Patient declined No - Patient declined No - Patient declined No - Patient declined Yes (MAU/Ambulatory/Procedural Areas - Information given)     Current Medications (verified) Outpatient Encounter Medications as of 03/24/2022  Medication Sig   acetic acid 2 % otic solution    aspirin 81 MG EC tablet Take 1 tablet (81 mg total) by mouth daily.   carvedilol (COREG) 6.25 MG tablet Take 0.5 tablets (3.125 mg total) by mouth 2 (two) times daily with a meal.   Ferrous Gluconate 324 (37.5 Fe) MG TABS TAKE 1 TABLET BY MOUTH TWICE DAILY WITH MEALS   furosemide (LASIX) 20 MG tablet Take 20 mg by mouth daily.   latanoprost (XALATAN) 0.005 % ophthalmic solution Place 1 drop into both eyes daily.   Nutritional Supplements  (SUPLENA/CARB STEADY) LIQD Take by mouth.   rosuvastatin (CRESTOR) 40 MG tablet TAKE 1 TABLET BY MOUTH ONCE DAILY AT  6PM   traZODone (DESYREL) 150 MG tablet Take 150 mg by mouth at bedtime as needed for sleep.    ezetimibe (ZETIA) 10 MG tablet Take 1 tablet (10 mg total) by mouth daily.   spironolactone (ALDACTONE) 25 MG tablet Take 1 tablet (25 mg total) by mouth daily.   [DISCONTINUED] feeding supplement, ENSURE ENLIVE, (ENSURE ENLIVE) LIQD Take 237 mLs by mouth 3 (three) times daily between meals.   No facility-administered encounter medications on file as of 03/24/2022.    Allergies (verified) Ace inhibitors and Entresto [sacubitril-valsartan]   History: Past Medical History:  Diagnosis Date   Anemia, chronic disease 07/01/2015   Angina decubitus    Benign essential hypertension 10/31/2008   Qualifier: Diagnosis of  By: HPercival Spanish MD, FFarrel Gordon    CAD (coronary artery disease)    non obstructive. Left main normal. LAd proximal long 25% stenosis, termingating as focal 50% prox to mid lesion. First & second diag were small, normal. Circumflex in proximal av groove had luminal irregularities.. Was large mid obtuse marg which was branching, scattered luminal irregul. Infer branch did supply some septal perforators. Was PDA,small & normal.  < 1.548m There was prox 95% stenosis   CARDIOMYOPATHY 11/27/2008   CKD (  chronic kidney disease)    Hyperlipidemia    transient history, controlled on diet   Hypertension    Mixed hyperlipidemia 10/30/2008   Qualifier: Diagnosis of  By: Lovette Cliche, CNA, [name]     Non-traumatic rhabdomyolysis 07/03/2015   Polyarthropathy 07/01/2015   Primary osteoarthritis of knees, bilateral 12/10/2014   Pyogenic arthritis of left knee joint (HCC)    Swelling of left knee joint 03/27/4172   Systolic HF (heart failure) ([place]) 02/08/2018   Past Surgical History:  Procedure Laterality Date   CORONARY ARTERY BYPASS GRAFT N/A 02/17/2018   Procedure: CORONARY ARTERY BYPASS  GRAFTING (CABG);  Surgeon: [name], MD;  Location: [facility];  Service: Open Heart Surgery;  Laterality: N/A;  Times 3 using endoscopically harvested right saphenous vein.     KNEE ARTHROSCOPY Left 07/01/2015   Procedure: Irrigation and debridement left knee arthroscopy;  Surgeon: Renette Butters, MD;  Location: [facility];  Service: Orthopedics;  Laterality: Left;   RIGHT/LEFT HEART CATH AND CORONARY ANGIOGRAPHY N/A 02/08/2018   Procedure: RIGHT/LEFT HEART CATH AND CORONARY ANGIOGRAPHY;  Surgeon: Jettie Booze, MD;  Location: [facility];  Service: Cardiovascular;  Laterality: N/A;   TEE WITHOUT CARDIOVERSION N/A 02/17/2018   Procedure: TRANSESOPHAGEAL ECHOCARDIOGRAM (TEE);  Surgeon: [name], MD;  Location: [facility];  Service: Open Heart Surgery;  Laterality: N/A;   Family History  Problem Relation Age of Onset   Diabetes Mother    Arthritis Mother    Heart disease Mother    Hyperlipidemia Mother    Hypertension Mother    Heart attack Mother 16   Heart disease Brother        had in his 21s. Had CABG x2   Hypothyroidism Daughter    Stroke Daughter    Social History   Socioeconomic History   Marital status: Widowed    Spouse name: Not on file   Number of children: Not on file   Years of education: Not on file   Highest education level: Not on file  Occupational History   Not on file  Tobacco Use   Smoking status: Never   Smokeless tobacco: Never  Vaping Use   Vaping Use: Never used  Substance and Sexual Activity   Alcohol use: Yes    Comment: one glass of wine or beer occassionaly   Drug use: No   Sexual activity: Yes  Other Topics Concern   Not on file  Social History Narrative   Lives with grandson (29 yo), wife passed on 05-21-00. 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    Enjoys bowling- Is in a bowling league    02/11/2018 @ 1352 Pt discharged home with niece. No s/s of distress noted. Education  completed, discharge instructions provided and understanding verbalized. Iv sites removed *2. Occlusive drsg per procedure-cdi. No c/o pain/nor distress.   Social Determinants of Health   Financial Resource Strain: Low Risk  (03/24/2022)   Overall Financial Resource Strain (CARDIA)    Difficulty of Paying Living Expenses: Not hard at all  Food Insecurity: No Food Insecurity (03/24/2022)   Hunger Vital Sign    Worried About Running Out of Food in the Last Year: Never true    Ran Out of Food in the Last Year: Never true  Transportation Needs: No Transportation Needs (03/24/2022)   PRAPARE - Hydrologist (Medical): No    Lack of Transportation (Non-Medical): No  Physical Activity: Insufficiently Active (03/24/2022)   Exercise Vital Sign    Days of Exercise per Week: 4 days    Minutes of Exercise per Session: 30 min  Stress: No Stress Concern Present (03/24/2022)   [place]    Feeling of Stress : Not at all  Social Connections: Moderately Integrated (03/24/2022)   Social Connection and Isolation Panel [NHANES]    Frequency of Communication with Friends and Family: More than three times a week    Frequency of Social Gatherings with Friends and Family: More than three times a week    Attends Religious Services: More than 4 times per year    Active Member of [employer] or Organizations: Yes    Attends Archivist Meetings: 1 to 4 times per year    Marital Status: Widowed    Tobacco Counseling Counseling given: Not Answered   Clinical Intake:  Pre-visit preparation completed: Yes  Pain : No/denies pain     BMI - recorded: 21.52 Nutritional Status: BMI of 19-24  Normal Nutritional Risks: None Diabetes: No  How often do you need to have someone help you when you read instructions, pamphlets, or other written materials from your doctor or pharmacy?: 1 -  Never  Diabetic?no  Interpreter Needed?: No  Information entered by :: Charlott Rakes, LPN   Activities of Daily Living    03/24/2022    8:26 AM  In your present state of health, do you have any difficulty performing the following activities:  Hearing? 0  Vision? 0  Difficulty concentrating or making decisions? 0  Walking or climbing stairs? 0  Dressing or bathing? 0  Doing errands, shopping? 0  Preparing Food and eating ? N  Using the Toilet? N  In the past six months, have you accidently leaked urine? N  Do you have problems with loss of bowel control? N  Managing your Medications? N  Managing your Finances? N  Housekeeping or managing your Housekeeping? N    Patient Care Team: Leamon Arnt, MD as PCP - General (Family Medicine) Larey Dresser, MD as PCP - Advanced Heart Failure (Cardiology) [name], MD as PCP - Cardiology (Cardiology) Renette Butters, MD as Attending Physician (Orthopedic Surgery) [name], MD as Consulting Physician (Nephrology) Caryl Pina, MD (Hematology) [name], MD as Consulting Physician (Cardiology) [facility] Wyatt Portela, MD as Consulting Physician (Oncology) [facility], [facility] as Consulting Physician Edythe Clarity, [facility] (Pharmacist)  Indicate any recent Medical Services you may have received from other than [facility] providers in the past year (date may be approximate).     Assessment:   This is a routine wellness examination for Azeez.  Hearing/Vision screen Hearing Screening - Comments:: Pt denies a hearing issues  Vision Screening - Comments:: [facility]   Dietary issues and exercise activities discussed: Current Exercise Habits: Home exercise routine, Type of exercise: Other - see comments;stretching (bowling and online exercise)   Goals Addressed             This Visit's Progress    Patient Stated       Stay healthy and active         Depression Screen    03/24/2022    8:22 AM 12/23/2021    9:23 AM 06/23/2021    2:46 PM 05/28/2020   10:06 AM 01/16/2019    8:21 AM 05/02/2018    3:09 PM  08/25/2017    8:31 AM  PHQ 2/9 Scores  PHQ - 2 Score 0 0 0 0 0 0 0    Fall Risk    03/24/2022    8:26 AM 12/23/2021    9:23 AM 05/28/2020   10:06 AM 01/16/2019    8:21 AM 10/31/2018    1:48 PM  Fall Risk   Falls in the past year? 0 0 0 0 0  Number falls in past yr: 0 0 0  0  Injury with Fall? 0 0 0 0 0  Risk for fall due to : Impaired vision No Fall Risks     Follow up Falls prevention discussed Falls evaluation completed  Education provided;Falls prevention discussed;Falls evaluation completed Falls evaluation completed    FALL RISK PREVENTION PERTAINING TO THE HOME:  Any stairs in or around the home? Yes  If so, are there any without handrails? No  Home free of loose throw rugs in walkways, pet beds, electrical cords, etc? Yes  Adequate lighting in your home to reduce risk of falls? Yes   ASSISTIVE DEVICES UTILIZED TO PREVENT FALLS:  Life alert? No  Use of a cane, walker or w/c? No  Grab bars in the bathroom? Yes  Shower chair or bench in shower? No  Elevated toilet seat or a handicapped toilet? No   TIMED UP AND GO:  Was the test performed? No .   Cognitive Function:    08/25/2017    8:34 AM  MMSE - Mini Mental State Exam  Orientation to time 5  Orientation to Place 5  Registration 3  Attention/ Calculation 3  Recall 3  Language- name 2 objects 2  Language- repeat 1  Language- follow 3 step command 3  Language- read & follow direction 1  Write a sentence 1  Copy design 1  Total score 28        03/24/2022    8:27 AM 01/16/2019    8:23 AM  6CIT Screen  What Year? 0 points 0 points  What month? 0 points 0 points  What time? 0 points 0 points  Count back from 20 0 points 0 points  Months in reverse 0 points 0 points  Repeat phrase 4 points 0 points  Total Score 4 points 0 points     Immunizations Immunization History  Administered Date(s) Administered   Fluad Quad(high Dose 65+) 10/31/2018, 11/28/2020, 12/23/2021   Influenza Split 11/03/2010   Influenza, High Dose Seasonal PF 11/30/2011, 12/06/2013, 12/10/2014, 11/04/2015, 11/08/2015, 12/20/2015, 12/16/2016, 02/10/2018   Influenza,inj,Quad PF,6+ Mos 12/05/2012   Influenza-Unspecified 01/31/2017, 11/13/2018, 12/07/2020   Moderna Covid-19 Vaccine Bivalent Booster 56yr & up 03/06/2021   Moderna Sars-Covid-2 Vaccination 04/20/2019, 05/18/2019, 01/25/2020, 07/22/2020   Pneumococcal Conjugate-13 12/06/2013   Pneumococcal Polysaccharide-23 03/09/2008   Tdap 12/03/2008, 12/04/2014   Zoster Recombinat (Shingrix) 06/10/2021   Zoster, Live 03/09/2008    TDAP status: Up to date  Flu Vaccine status: Up to date  Pneumococcal vaccine status: Up to date  Covid-19 vaccine status: Completed vaccines  Qualifies for Shingles Vaccine? Yes   Zostavax completed Yes   Shingrix Completed?: Yes  Screening Tests Health Maintenance  Topic Date Due   COVID-19 Vaccine (6 - 2023-24 season) 11/07/2021   Medicare Annual Wellness (AWV)  03/25/2023   DTaP/Tdap/Td (3 - Td or Tdap) 12/03/2024   Pneumonia Vaccine 84 Years old  Completed   INFLUENZA VACCINE  Completed   Zoster Vaccines- Shingrix  Completed   HPV VACCINES  Aged Out  Health Maintenance  Health Maintenance Due  Topic Date Due   COVID-19 Vaccine (6 - 2023-24 season) 11/07/2021    Colorectal cancer screening: No longer required.    Additional Screening:  Vision Screening: Recommended annual ophthalmology exams for early detection of glaucoma and other disorders of the eye. Is the patient up to date with their annual eye exam?  Yes  Who is the provider or what is the name of the office in which the patient attends annual eye exams? [facility] If pt is not established with a provider, would they like to be referred to a provider to establish care? No .   Dental  Screening: Recommended annual dental exams for proper oral hygiene  Community Resource Referral / Chronic Care Management: CRR required this visit?  No   CCM required this visit?  No      Plan:     I have personally reviewed and noted the following in the patient's chart:   Medical and social history Use of alcohol, tobacco or illicit drugs  Current medications and supplements including opioid prescriptions. Patient is not currently taking opioid prescriptions. Functional ability and status Nutritional status Physical activity Advanced directives List of other physicians Hospitalizations, surgeries, and ER visits in previous 12 months Vitals Screenings to include cognitive, depression, and falls Referrals and appointments  In addition, I have reviewed and discussed with patient certain preventive protocols, quality metrics, and best practice recommendations. A written personalized care plan for preventive services as well as general preventive health recommendations were provided to patient.     Willette Brace, LPN   10/31/537   Nurse Notes: none

## 2022-05-05 ENCOUNTER — Telehealth: Payer: Self-pay | Admitting: Pharmacist

## 2022-05-05 NOTE — Progress Notes (Unsigned)
Care Management & Coordination Services Pharmacy Team  Reason for Encounter: General adherence update   Contacted patient for general health update and medication adherence call.  {US HC Outreach:28874}   What concerns do you have about your medications?  The patient {denies/reports:25180} side effects with their medications.   How often do you forget or accidentally miss a dose? {missed doses:25554}  Do you use a pillbox? {yes/no:20286}  Are you having any problems getting your medications from your pharmacy? {yes/no:20286}  Has the cost of your medications been a concern? {yes/no:20286} If yes, what medication and is patient assistance available or has it been applied for?  Since last visit with PharmD, {no/thefollowing:25210} interventions have been made.   The patient {has/has not:25209} had an ED visit since last contact.   The patient {denies/reports:25180} problems with their health.   Patient {denies/reports:25180} concerns or questions for ***, PharmD at this time.   Counseled patient on: {GENERALCOUNSELING:28686}   Chart Updates:  Recent office visits:  None since last visit with PharmD  Recent consult visits:  None since last visit with PharmD  Hospital visits:  None in previous 6 months  Medications: Outpatient Encounter Medications as of 05/05/2022  Medication Sig   acetic acid 2 % otic solution    aspirin 81 MG EC tablet Take 1 tablet (81 mg total) by mouth daily.   carvedilol (COREG) 6.25 MG tablet Take 0.5 tablets (3.125 mg total) by mouth 2 (two) times daily with a meal.   ezetimibe (ZETIA) 10 MG tablet Take 1 tablet (10 mg total) by mouth daily.   Ferrous Gluconate 324 (37.5 Fe) MG TABS TAKE 1 TABLET BY MOUTH TWICE DAILY WITH MEALS   furosemide (LASIX) 20 MG tablet Take 20 mg by mouth daily.   latanoprost (XALATAN) 0.005 % ophthalmic solution Place 1 drop into both eyes daily.   Nutritional Supplements (SUPLENA/CARB STEADY) LIQD Take by mouth.    rosuvastatin (CRESTOR) 40 MG tablet TAKE 1 TABLET BY MOUTH ONCE DAILY AT  6PM   spironolactone (ALDACTONE) 25 MG tablet Take 1 tablet (25 mg total) by mouth daily.   traZODone (DESYREL) 150 MG tablet Take 150 mg by mouth at bedtime as needed for sleep.    No facility-administered encounter medications on file as of 05/05/2022.    Recent vitals BP Readings from Last 3 Encounters:  12/26/21 (!) 106/56  12/23/21 126/64  06/23/21 (!) 124/58   Pulse Readings from Last 3 Encounters:  12/26/21 70  12/23/21 75  06/23/21 78   Wt Readings from Last 3 Encounters:  03/24/22 150 lb (68 kg)  12/26/21 146 lb 14.4 oz (66.6 kg)  12/23/21 150 lb 3.2 oz (68.1 kg)   BMI Readings from Last 3 Encounters:  03/24/22 21.52 kg/m  12/26/21 21.08 kg/m  12/23/21 21.55 kg/m    Recent lab results    Component Value Date/Time   NA 140 12/26/2021 1009   NA 141 04/11/2020 1046   NA 139 06/12/2016 1313   K 4.3 12/26/2021 1009   K 4.4 06/12/2016 1313   CL 106 12/26/2021 1009   CO2 29 12/26/2021 1009   CO2 25 06/12/2016 1313   GLUCOSE 93 12/26/2021 1009   GLUCOSE 92 06/12/2016 1313   BUN 21 12/26/2021 1009   BUN 23 04/11/2020 1046   BUN 17.9 06/12/2016 1313   CREATININE 1.59 (H) 12/26/2021 1009   CREATININE 1.2 06/12/2016 1313   CALCIUM 9.4 12/26/2021 1009   CALCIUM 9.3 06/12/2016 1313    Lab Results  Component Value Date   CREATININE 1.59 (H) 12/26/2021   GFR 45.91 (L) 06/23/2021   EGFR 66 (L) 06/12/2016   GFRNONAA 43 (L) 12/26/2021   GFRAA 43 (L) 04/11/2020   Lab Results  Component Value Date/Time   HGBA1C 5.7 (H) 02/16/2018 11:18 AM   HGBA1C  10/19/2008 05:25 AM    6.1 (NOTE) The ADA recommends the following therapeutic goal for glycemic control related to Hgb A1c measurement: Goal of therapy: <6.5 Hgb A1c  Reference: American Diabetes Association: Clinical Practice Recommendations 2010, Diabetes Care, 2010, 33: (Suppl  1).    Lab Results  Component Value Date   CHOL 110  06/23/2021   HDL 40.60 06/23/2021   LDLCALC 58 06/23/2021   TRIG 54.0 06/23/2021   CHOLHDL 3 06/23/2021    Care Gaps: Annual wellness visit in last year? Yes   Star Rating Drugs:  Rosuvastatin 40 mg last filled 03/27/2021 90 DS   Future Appointments  Date Time Provider [place]  06/24/2022  8:30 AM Leamon Arnt, MD [facility]  12/29/2022 12:30 PM [facility] None  12/29/2022  1:00 PM Cordelia Poche [facility] None  04/01/2023  8:15 AM [facility] HEALTH COACH [facility]   April D Calhoun, [place] Pharmacist Assistant [phone]

## 2022-05-06 DIAGNOSIS — I509 Heart failure, unspecified: Secondary | ICD-10-CM | POA: Diagnosis not present

## 2022-05-06 DIAGNOSIS — M25421 Effusion, right elbow: Secondary | ICD-10-CM | POA: Diagnosis not present

## 2022-05-06 DIAGNOSIS — Z7982 Long term (current) use of aspirin: Secondary | ICD-10-CM | POA: Diagnosis not present

## 2022-05-06 DIAGNOSIS — M25521 Pain in right elbow: Secondary | ICD-10-CM | POA: Diagnosis not present

## 2022-05-06 DIAGNOSIS — N189 Chronic kidney disease, unspecified: Secondary | ICD-10-CM | POA: Diagnosis not present

## 2022-05-06 DIAGNOSIS — I130 Hypertensive heart and chronic kidney disease with heart failure and stage 1 through stage 4 chronic kidney disease, or unspecified chronic kidney disease: Secondary | ICD-10-CM | POA: Diagnosis not present

## 2022-05-06 DIAGNOSIS — Z888 Allergy status to other drugs, medicaments and biological substances status: Secondary | ICD-10-CM | POA: Diagnosis not present

## 2022-05-09 ENCOUNTER — Encounter (HOSPITAL_BASED_OUTPATIENT_CLINIC_OR_DEPARTMENT_OTHER): Payer: Self-pay | Admitting: Emergency Medicine

## 2022-05-09 ENCOUNTER — Emergency Department (HOSPITAL_BASED_OUTPATIENT_CLINIC_OR_DEPARTMENT_OTHER)
Admission: EM | Admit: 2022-05-09 | Discharge: 2022-05-09 | Disposition: A | Discharge status: Home / Self Care | Payer: No Typology Code available for payment source | Attending: Emergency Medicine | Admitting: Emergency Medicine

## 2022-05-09 ENCOUNTER — Other Ambulatory Visit: Payer: Self-pay

## 2022-05-09 ENCOUNTER — Emergency Department (HOSPITAL_BASED_OUTPATIENT_CLINIC_OR_DEPARTMENT_OTHER): Payer: No Typology Code available for payment source

## 2022-05-09 DIAGNOSIS — Z7982 Long term (current) use of aspirin: Secondary | ICD-10-CM | POA: Insufficient documentation

## 2022-05-09 DIAGNOSIS — Z79899 Other long term (current) drug therapy: Secondary | ICD-10-CM | POA: Insufficient documentation

## 2022-05-09 DIAGNOSIS — N189 Chronic kidney disease, unspecified: Secondary | ICD-10-CM | POA: Diagnosis not present

## 2022-05-09 DIAGNOSIS — I509 Heart failure, unspecified: Secondary | ICD-10-CM | POA: Insufficient documentation

## 2022-05-09 DIAGNOSIS — M545 Low back pain, unspecified: Secondary | ICD-10-CM | POA: Diagnosis not present

## 2022-05-09 DIAGNOSIS — R519 Headache, unspecified: Secondary | ICD-10-CM | POA: Diagnosis not present

## 2022-05-09 DIAGNOSIS — M542 Cervicalgia: Secondary | ICD-10-CM | POA: Insufficient documentation

## 2022-05-09 DIAGNOSIS — I130 Hypertensive heart and chronic kidney disease with heart failure and stage 1 through stage 4 chronic kidney disease, or unspecified chronic kidney disease: Secondary | ICD-10-CM | POA: Insufficient documentation

## 2022-05-09 DIAGNOSIS — Y92410 Unspecified street and highway as the place of occurrence of the external cause: Secondary | ICD-10-CM | POA: Insufficient documentation
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
## 2022-05-13 ENCOUNTER — Telehealth: Payer: Self-pay
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
## 2022-05-14 ENCOUNTER — Telehealth: Payer: Self-pay
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
## 2022-05-27 ENCOUNTER — Encounter (HOSPITAL_COMMUNITY): Payer: Self-pay | Admitting: *Deleted

## 2022-06-22 DIAGNOSIS — D472 Monoclonal gammopathy: Secondary | ICD-10-CM | POA: Diagnosis not present

## 2022-06-22 DIAGNOSIS — D631 Anemia in chronic kidney disease: Secondary | ICD-10-CM | POA: Diagnosis not present

## 2022-06-22 DIAGNOSIS — I429 Cardiomyopathy, unspecified: Secondary | ICD-10-CM | POA: Diagnosis not present

## 2022-06-22 DIAGNOSIS — N1831 Chronic kidney disease, stage 3a: Secondary | ICD-10-CM | POA: Diagnosis not present

## 2022-06-22 DIAGNOSIS — I129 Hypertensive chronic kidney disease with stage 1 through stage 4 chronic kidney disease, or unspecified chronic kidney disease: Secondary | ICD-10-CM | POA: Diagnosis not present

## 2022-06-23 LAB — LAB REPORT - SCANNED
Creatinine, Random Urine: 105.5
Protein/Creatinine Ratio: 56
Total Protein, Urine: 5.9

## 2022-06-24 ENCOUNTER — Encounter (HOSPITAL_COMMUNITY): Payer: Self-pay | Admitting: *Deleted

## 2022-06-24 ENCOUNTER — Ambulatory Visit (INDEPENDENT_AMBULATORY_CARE_PROVIDER_SITE_OTHER): Payer: Medicare Other | Admitting: Family Medicine

## 2022-06-24 ENCOUNTER — Encounter: Payer: Self-pay | Admitting: Family Medicine

## 2022-06-24 VITALS — BP 106/56 | HR 74 | Temp 98.1°F | Ht 70.0 in | Wt 149.0 lb

## 2022-06-24 DIAGNOSIS — N1831 Chronic kidney disease, stage 3a: Secondary | ICD-10-CM

## 2022-06-24 DIAGNOSIS — D472 Monoclonal gammopathy: Secondary | ICD-10-CM

## 2022-06-24 DIAGNOSIS — E782 Mixed hyperlipidemia: Secondary | ICD-10-CM | POA: Diagnosis not present

## 2022-06-24 DIAGNOSIS — I428 Other cardiomyopathies: Secondary | ICD-10-CM | POA: Diagnosis not present

## 2022-06-24 DIAGNOSIS — I5022 Chronic systolic (congestive) heart failure: Secondary | ICD-10-CM

## 2022-06-24 DIAGNOSIS — I25118 Atherosclerotic heart disease of native coronary artery with other forms of angina pectoris: Secondary | ICD-10-CM

## 2022-06-24 DIAGNOSIS — I10 Essential (primary) hypertension: Secondary | ICD-10-CM

## 2022-06-24 LAB — LIPID PANEL
Cholesterol: 131 mg/dL (ref 0–200)
HDL: 48 mg/dL (ref >39.00)
LDL Cholesterol: 70 mg/dL (ref 0–99)
NonHDL: 82.94
Total CHOL/HDL Ratio: 3
Triglycerides: 64 mg/dL (ref 0.0–149.0)
VLDL: 12.8 mg/dL (ref 0.0–40.0)

## 2022-06-24 LAB — TSH: TSH: 1.9 u[IU]/mL (ref 0.35–5.50)

## 2022-06-24 NOTE — Patient Instructions (Addendum)
Please return in 12 months for your annual complete physical; please come fasting.   I will release your lab results to you on your MyChart account with further instructions. You may see the results before I do, but when I review them I will send you a message with my report or have my assistant call you if things need to be discussed. Please reply to my message with any questions. Thank you!   If you have any questions or concerns, please don't hesitate to send me a message via MyChart or call the office at [phone]. Thank you for visiting with [place] today! It's our pleasure caring for you.   Take care of yourself and have a great year!
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
## 2022-07-07 ENCOUNTER — Encounter: Payer: Self-pay | Admitting: *Deleted

## 2022-07-27 ENCOUNTER — Telehealth: Payer: Self-pay | Admitting: Pharmacist

## 2022-07-27 NOTE — Progress Notes (Signed)
Care Management & Coordination Services Pharmacy Team  Reason for Encounter: Appointment Reminder  Contacted patient to confirm telephone appointment with Erskine Emery, PharmD on 07/28/2022 at 11 am. Unsuccessful outreach. Left voicemail with appointment details.   Star Rating Drugs:  Rosuvastatin 40 mg last filled 03/27/2021 90 DS   Care Gaps: Annual wellness visit in last year? Yes   April D Calhoun, [facility] Clinical Pharmacist Assistant [phone]

## 2022-07-28 ENCOUNTER — Ambulatory Visit: Payer: TRICARE For Life (TFL) | Admitting: Pharmacist

## 2022-07-28 NOTE — Progress Notes (Signed)
Care Management & Coordination Services Pharmacy Note  07/28/2022 Name:  Luis Chen MRN:  [id number] DOB:  05-Oct-1940  Summary: PharmD FU visit for CHF.  Continues same medications with no concerns.  Denies SOB or swelling.  Taking medications appropriately and now getting these from [place] clinic.  Recommendations/Changes made from today's visit: No changes  Follow up plan: FU 6 monhts   Subjective: Luis Chen is an 82 y.o. year old male who is a primary patient of Willow Ora, MD.  The care coordination team was consulted for assistance with disease management and care coordination needs.    Engaged with patient by telephone for follow up visit.  Recent office visits:  None since last visit with PharmD   Recent consult visits:  None since last visit with PharmD   Hospital visits:  None in previous 6 months   Objective:  Lab Results  Component Value Date   CREATININE 1.59 (H) 12/26/2021   BUN 21 12/26/2021   GFR 45.91 (L) 06/23/2021   EGFR 66 (L) 06/12/2016   GFRNONAA 43 (L) 12/26/2021   GFRAA 43 (L) 04/11/2020   NA 140 12/26/2021   K 4.3 12/26/2021   CALCIUM 9.4 12/26/2021   CO2 29 12/26/2021   GLUCOSE 93 12/26/2021    Lab Results  Component Value Date/Time   HGBA1C 5.7 (H) 02/16/2018 11:18 AM   HGBA1C  10/19/2008 05:25 AM    6.1 (NOTE) The ADA recommends the following therapeutic goal for glycemic control related to Hgb A1c measurement: Goal of therapy: <6.5 Hgb A1c  Reference: American Diabetes Association: Clinical Practice Recommendations 2010, Diabetes Care, 2010, 33: (Suppl  1).   GFR 45.91 (L) 06/23/2021 03:14 PM   GFR 36.50 (L) 05/28/2020 10:42 AM    Last diabetic Eye exam: No results found for: "HMDIABEYEEXA"  Last diabetic Foot exam: No results found for: "HMDIABFOOTEX"   Lab Results  Component Value Date   CHOL 131 06/24/2022   HDL 48.00 06/24/2022   LDLCALC 70 06/24/2022   TRIG 64.0 06/24/2022   CHOLHDL 3 06/24/2022        Latest Ref Rng & Units 12/26/2021   10:09 AM 06/23/2021    3:14 PM 12/27/2020    1:11 PM  Hepatic Function  Total Protein 6.5 - 8.1 g/dL 7.2  7.1  7.5   Albumin 3.5 - 5.0 g/dL 3.7  3.6  3.7   AST 15 - 41 U/L 23  32  25   ALT 0 - 44 U/L 17  27  24    Alk Phosphatase 38 - 126 U/L 74  77  74   Total Bilirubin 0.3 - 1.2 mg/dL 0.5  0.4  0.6     Lab Results  Component Value Date/Time   TSH 1.90 06/24/2022 09:20 AM   TSH 1.31 06/23/2021 03:14 PM   FREET4 1.28 10/19/2008 01:15 PM       Latest Ref Rng & Units 12/26/2021   10:09 AM 06/23/2021    3:14 PM 12/27/2020    1:11 PM  CBC  WBC 4.0 - 10.5 K/uL 5.2  6.2  5.6   Hemoglobin 13.0 - 17.0 g/dL 40.9  81.1  91.4   Hematocrit 39.0 - 52.0 % 33.5  30.5  29.6   Platelets 150 - 400 K/uL 174  178.0  194     Lab Results  Component Value Date/Time   VITAMINB12 265 07/05/2015 08:51 AM    Clinical ASCVD: No  The ASCVD Risk score (Arnett DK,  et al., 2019) failed to calculate for the following reasons:   The 2019 ASCVD risk score is only valid for ages 60 to 3        06/24/2022    8:54 AM 03/24/2022    8:22 AM 12/23/2021    9:23 AM  Depression screen PHQ 2/9  Decreased Interest 0 0 0  Down, Depressed, Hopeless 0 0 0  PHQ - 2 Score 0 0 0     Social History   Tobacco Use  Smoking Status Never  Smokeless Tobacco Never   BP Readings from Last 3 Encounters:  06/24/22 (!) 106/56  05/09/22 (!) 150/77  12/26/21 (!) 106/56   Pulse Readings from Last 3 Encounters:  06/24/22 74  05/09/22 68  12/26/21 70   Wt Readings from Last 3 Encounters:  06/24/22 149 lb (67.6 kg)  03/24/22 150 lb (68 kg)  12/26/21 146 lb 14.4 oz (66.6 kg)   BMI Readings from Last 3 Encounters:  06/24/22 21.38 kg/m  03/24/22 21.52 kg/m  12/26/21 21.08 kg/m    Allergies  Allergen Reactions   Ace Inhibitors Swelling    Facial swelling    Entresto [Sacubitril-Valsartan] Swelling    Angioedema with ACE Inhibitor    Medications Reviewed Today      Reviewed by Erroll Luna, RPH (Pharmacist) on 07/28/22 at 1351  Med List Status: <None>   Medication Order Taking? Sig Documenting Provider Last Dose Status Informant  acetic acid 2 % otic solution [id number] No  [provider] Taking Active   aspirin 81 MG EC tablet [id number] No Take 1 tablet (81 mg total) by mouth daily. Azalee Course, [place] Taking Active   carvedilol (COREG) 6.25 MG tablet [id number] No Take 0.5 tablets (3.125 mg total) by mouth 2 (two) times daily with a meal. Willow Ora, MD Taking Active   ezetimibe (ZETIA) 10 MG tablet [id number] No Take 1 tablet (10 mg total) by mouth daily. Laurey Morale, MD Taking Expired 05/26/21 2359   Ferrous Gluconate 324 (37.5 Fe) MG TABS [id number] No TAKE 1 TABLET BY MOUTH TWICE DAILY WITH MEALS Shadad, Blenda Nicely, MD Taking Active   furosemide (LASIX) 20 MG tablet [id number] No Take 20 mg by mouth daily. [provider] Taking Active Self  latanoprost (XALATAN) 0.005 % ophthalmic solution [id number] No Place 1 drop into both eyes daily. [provider] Taking Active Self  Nutritional Supplements (SUPLENA/CARB STEADY) LIQD [id number] No Take by mouth. [provider] Taking Active   rosuvastatin (CRESTOR) 40 MG tablet [id number] No TAKE 1 TABLET BY MOUTH ONCE DAILY AT  Toula Moos, MD Taking Active   spironolactone (ALDACTONE) 25 MG tablet [id number] No Take 1 tablet (25 mg total) by mouth daily. Laurey Morale, MD Taking Expired 05/26/21 2359   traZODone (DESYREL) 150 MG tablet [id number] No Take 150 mg by mouth at bedtime as needed for sleep.  [provider] Taking Active Self            SDOH:  (Social Determinants of Health) assessments and interventions performed: No, done within year Financial Resource Strain: Low Risk  (03/24/2022)   Overall Financial Resource Strain (CARDIA)    Difficulty of Paying Living Expenses: Not hard at all   Food Insecurity: No Food Insecurity (03/24/2022)    Hunger Vital Sign    Worried About Running Out of Food in the Last Year: Never true    Ran Out of Food in the Last Year: Never true  SDOH Interventions    Flowsheet Row Clinical Support from 03/24/2022 in [facility] Chronic Care Management from 10/31/2019 in [facility]  SDOH Interventions    Food Insecurity Interventions Intervention Not Indicated Other (Comment)  Housing Interventions Intervention Not Indicated --  Transportation Interventions Intervention Not Indicated Other (Comment)  Utilities Interventions Intervention Not Indicated --  Financial Strain Interventions Intervention Not Indicated --  Physical Activity Interventions Intervention Not Indicated --  Stress Interventions Intervention Not Indicated --  Social Connections Interventions Intervention Not Indicated --       Medication Assistance: None required.  Patient affirms current coverage meets needs.  Medication Access: Name and location of current pharmacy:  [facility] - [place], [place] - [address] [place] [address] Phone: [phone] Fax: [phone]  [facility] [address] [place] [address] Phone: [phone] Fax: [phone]  Within the past 30 days, how often has patient missed a dose of medication? 0 Is a pillbox or other method used to improve adherence? No  Factors that may affect medication adherence? no barriers identified Are meds synced by current pharmacy? No  Are meds delivered by current pharmacy? Yes  Does patient experience delays in picking up medications due to transportation concerns? No   Compliance/Adherence/Medication fill history: Star Rating Drugs:  Rosuvastatin 40 mg last filled 03/27/2021 90 DS - gets meds through [place] so no data past this date     Care Gaps: Annual wellness visit in last year?  Yes   Assessment/Plan     Hyperlipidemia: (LDL goal < 70) 02/17/22 -Controlled, not assessed today -Current treatment: Rosuvastatin 40mg  Appropriate, Effective, Query Safe,  -Medications previously tried: none noted  -Current exercise habits: he is still bowling a few times per week and doing virtual exercise classes through the [place] a few times per week.  He really enjoys these and keeps him active -Educated on Cholesterol goals;  Benefits of statin for ASCVD risk reduction; Importance of limiting foods high in cholesterol; Exercise goal of 150 minutes per week; -Recommended to continue current medication -Consider - GFR 43 most recently.  Crestor max dose is 10mg  once drops below 30.  Could go ahead and switch to Atorvastatin.  He reportedly saw his kidney doctor today and had labs done will FU on this.  May be something to monitor for now.  Heart Failure (Goal: manage symptoms and prevent exacerbations) 07/28/22 -Controlled -Last ejection fraction: 2020 (Date: 50-55%) -HF type: HFimpEF (EF improved from <40% to > 40%) -NYHA Class: I (no actitivty limitation) -AHA HF Stage: B (Heart disease present - no symptoms present) -Current treatment: Carvedilol 6.25mg  one-half tab bid Appropriate, Effective, Safe, Accessible Furosemide 20mg  daily Appropriate, Effective, Safe, Accessible Spironolactone 25mg  Appropriate, Effective, Safe, Accessible -Medications previously tried: none noted  -Current home BP/HR readings: controlled, he checks at home daily (had not checked yet today) -Current home daily weights: stable -Current exercise habits: same see above -Educated on Benefits of medications for managing symptoms and prolonging life Importance of weighing daily; if you gain more than 3 pounds in one day or 5 pounds in one week, contact providers -Denies any swelling or shortness of breath.  All meds through [place] and reports 100% adherence.  At this time no changes.  Continue to monitor for  swelling, SOB and contact providers at first sign of changes. CMA to FU in 30 days.        Willa Frater, PharmD Clinical Pharmacist  [place] [phone]

## 2022-12-28 ENCOUNTER — Other Ambulatory Visit: Payer: Self-pay | Admitting: Physician Assistant

## 2022-12-28 DIAGNOSIS — D472 Monoclonal gammopathy: Secondary | ICD-10-CM

## 2022-12-29 ENCOUNTER — Encounter (HOSPITAL_COMMUNITY): Payer: Self-pay | Admitting: *Deleted

## 2022-12-29 ENCOUNTER — Inpatient Hospital Stay (HOSPITAL_BASED_OUTPATIENT_CLINIC_OR_DEPARTMENT_OTHER): Payer: Medicare Other | Admitting: Physician Assistant

## 2022-12-29 ENCOUNTER — Inpatient Hospital Stay: Payer: Medicare Other | Attending: Physician Assistant

## 2022-12-29 ENCOUNTER — Other Ambulatory Visit: Payer: Self-pay

## 2022-12-29 DIAGNOSIS — I130 Hypertensive heart and chronic kidney disease with heart failure and stage 1 through stage 4 chronic kidney disease, or unspecified chronic kidney disease: Secondary | ICD-10-CM | POA: Diagnosis not present

## 2022-12-29 DIAGNOSIS — D472 Monoclonal gammopathy: Secondary | ICD-10-CM | POA: Diagnosis not present

## 2022-12-29 DIAGNOSIS — Z79899 Other long term (current) drug therapy: Secondary | ICD-10-CM | POA: Diagnosis not present

## 2022-12-29 DIAGNOSIS — I429 Cardiomyopathy, unspecified: Secondary | ICD-10-CM | POA: Diagnosis not present

## 2022-12-29 DIAGNOSIS — I2510 Atherosclerotic heart disease of native coronary artery without angina pectoris: Secondary | ICD-10-CM | POA: Diagnosis not present

## 2022-12-29 DIAGNOSIS — E782 Mixed hyperlipidemia: Secondary | ICD-10-CM | POA: Insufficient documentation

## 2022-12-29 DIAGNOSIS — D638 Anemia in other chronic diseases classified elsewhere: Secondary | ICD-10-CM | POA: Diagnosis not present

## 2022-12-29 DIAGNOSIS — N189 Chronic kidney disease, unspecified: Secondary | ICD-10-CM | POA: Diagnosis not present

## 2022-12-29 DIAGNOSIS — Z7982 Long term (current) use of aspirin: Secondary | ICD-10-CM | POA: Diagnosis not present

## 2022-12-29 LAB — CMP (CANCER CENTER ONLY)
ALT: 20 U/L (ref 0–44)
AST: 24 U/L (ref 15–41)
Albumin: 3.7 g/dL (ref 3.5–5.0)
Alkaline Phosphatase: 69 U/L (ref 38–126)
Anion gap: 6 (ref 5–15)
BUN: 28 mg/dL — ABNORMAL HIGH (ref 8–23)
CO2: 26 mmol/L (ref 22–32)
Calcium: 9.3 mg/dL (ref 8.9–10.3)
Chloride: 108 mmol/L (ref 98–111)
Creatinine: 1.53 mg/dL — ABNORMAL HIGH (ref 0.61–1.24)
GFR, Estimated: 45 mL/min — ABNORMAL LOW (ref >60)
Glucose, Bld: 82 mg/dL (ref 70–99)
Potassium: 4.6 mmol/L (ref 3.5–5.1)
Sodium: 140 mmol/L (ref 135–145)
Total Bilirubin: 0.5 mg/dL (ref 0.3–1.2)
Total Protein: 7 g/dL (ref 6.5–8.1)

## 2022-12-29 LAB — CBC WITH DIFFERENTIAL (CANCER CENTER ONLY)
Abs Immature Granulocytes: 0.01 10*3/uL (ref 0.00–0.07)
Basophils Absolute: 0 10*3/uL (ref 0.0–0.1)
Basophils Relative: 1 %
Eosinophils Absolute: 0.3 10*3/uL (ref 0.0–0.5)
Eosinophils Relative: 7 %
HCT: 33.6 % — ABNORMAL LOW (ref 39.0–52.0)
Hemoglobin: 11.3 g/dL — ABNORMAL LOW (ref 13.0–17.0)
Immature Granulocytes: 0 %
Lymphocytes Relative: 43 %
Lymphs Abs: 2.1 10*3/uL (ref 0.7–4.0)
MCH: 32.1 pg (ref 26.0–34.0)
MCHC: 33.6 g/dL (ref 30.0–36.0)
MCV: 95.5 fL (ref 80.0–100.0)
Monocytes Absolute: 0.5 10*3/uL (ref 0.1–1.0)
Monocytes Relative: 9 %
Neutro Abs: 2 10*3/uL (ref 1.7–7.7)
Neutrophils Relative %: 40 %
Platelet Count: 183 10*3/uL (ref 150–400)
RBC: 3.52 MIL/uL — ABNORMAL LOW (ref 4.22–5.81)
RDW: 14.5 % (ref 11.5–15.5)
WBC Count: 4.9 10*3/uL (ref 4.0–10.5)
nRBC: 0 % (ref 0.0–0.2)

## 2022-12-29 NOTE — Progress Notes (Signed)
[facility] Telephone:([phone]) [phone]   Fax:(336) [phone]  PROGRESS NOTE  Patient Care Team: Luis Ora, MD as PCP - General (Family Medicine) Luis Morale, MD as PCP - Advanced Heart Failure (Cardiology) Luis Rotunda, MD as PCP - Cardiology (Cardiology) Luis Apley, MD as Attending Physician (Orthopedic Surgery) Luis Rhodes, MD as Consulting Physician (Nephrology) Luis Crocker, MD (Hematology) Luis Rotunda, MD as Consulting Physician (Cardiology) [facility], Blenda Nicely, MD (Inactive) as Consulting Physician (Oncology) Clinic, Lenn Sink as Consulting Physician Earlene Plater, Rita Ohara, [facility] (Inactive) (Pharmacist)   CHIEF COMPLAINTS/PURPOSE OF CONSULTATION:  IgG kappa MGUS  HISTORY OF PRESENTING ILLNESS:  Luis Chen 82 y.o. male returns for a follow up for continued management of MGUS. He was last seen by Dr. Clelia Croft on 12/26/2021.   On exam today, Luis Chen reports that overall he is doing well without any changes to his health since the last visit with Dr. Clelia Croft. He reports his energy and appetite are overall stable. He is able to complete his ADLs on his own. He denies nausea, vomiting or bowel habit changes. He has some right shoulder discomfort but adds that he is an avid bowler. He denies any new bone/back pain. He denies easy bruising or signs of bleeding. He denies fevers, chills, sweats, shortness of breath, chest pain or cough. He has no other complaints. Rest of the ROS is below.   MEDICAL HISTORY:  Past Medical History:  Diagnosis Date   Anemia, chronic disease 07/01/2015   Angina decubitus    Benign essential hypertension 10/31/2008   Qualifier: Diagnosis of  By: Antoine Poche, MD, Gerrit Heck     CAD (coronary artery disease)    non obstructive. Left main normal. LAd proximal long 25% stenosis, termingating as focal 50% prox to mid lesion. First & second diag were small, normal. Circumflex in proximal av groove had  luminal irregularities.. Was large mid obtuse marg which was branching, scattered luminal irregul. Infer branch did supply some septal perforators. Was PDA,small & normal.  < 1.72mm. There was prox 95% stenosis   CARDIOMYOPATHY 11/27/2008   CKD (chronic kidney disease)    Hyperlipidemia    transient history, controlled on diet   Hypertension    Mixed hyperlipidemia 10/30/2008   Qualifier: Diagnosis of  By: Cristela Felt, CNA, [name]     Non-traumatic rhabdomyolysis 07/03/2015   Polyarthropathy 07/01/2015   Primary osteoarthritis of knees, bilateral 12/10/2014   Pyogenic arthritis of left knee joint (HCC)    Swelling of left knee joint 07/01/2015   Systolic HF (heart failure) (HCC) 02/08/2018    SURGICAL HISTORY: Past Surgical History:  Procedure Laterality Date   CORONARY ARTERY BYPASS GRAFT N/A 02/17/2018   Procedure: CORONARY ARTERY BYPASS GRAFTING (CABG);  Surgeon: Alleen Borne, MD;  Location: [facility] OR;  Service: Open Heart Surgery;  Laterality: N/A;  Times 3 using endoscopically harvested right saphenous vein.     KNEE ARTHROSCOPY Left 07/01/2015   Procedure: Irrigation and debridement left knee arthroscopy;  Surgeon: Luis Apley, MD;  Location: [facility] OR;  Service: Orthopedics;  Laterality: Left;   RIGHT/LEFT HEART CATH AND CORONARY ANGIOGRAPHY N/A 02/08/2018   Procedure: RIGHT/LEFT HEART CATH AND CORONARY ANGIOGRAPHY;  Surgeon: Corky Crafts, MD;  Location: [facility] INVASIVE CV LAB;  Service: Cardiovascular;  Laterality: N/A;   TEE WITHOUT CARDIOVERSION N/A 02/17/2018   Procedure: TRANSESOPHAGEAL ECHOCARDIOGRAM (TEE);  Surgeon: Alleen Borne, MD;  Location: [facility] OR;  Service: Open Heart Surgery;  Laterality: N/A;  SOCIAL HISTORY: Social History   Socioeconomic History   Marital status: Widowed    Spouse name: Not on file   Number of children: Not on file   Years of education: Not on file   Highest education level: Not on file  Occupational History   Not on file  Tobacco Use   Smoking  status: Never   Smokeless tobacco: Never  Vaping Use   Vaping status: Never Used  Substance and Sexual Activity   Alcohol use: Yes    Comment: one glass of wine or beer occassionaly   Drug use: No   Sexual activity: Yes  Other Topics Concern   Not on file  Social History Narrative   Lives with grandson (64 yo), wife passed on January 16, 2001. 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    Enjoys bowling- Is in a bowling league    02/11/2018 @ 1352 Pt discharged home with niece. No s/s of distress noted. Education completed, discharge instructions provided and understanding verbalized. Iv sites removed *2. Occlusive drsg per procedure-cdi. No c/o pain/nor distress.   Social Determinants of Health   Financial Resource Strain: Low Risk  (03/24/2022)   Overall Financial Resource Strain (CARDIA)    Difficulty of Paying Living Expenses: Not hard at all  Food Insecurity: No Food Insecurity (03/24/2022)   Hunger Vital Sign    Worried About Running Out of Food in the Last Year: Never true    Ran Out of Food in the Last Year: Never true  Transportation Needs: No Transportation Needs (03/24/2022)   PRAPARE - Administrator, Civil Service (Medical): No    Lack of Transportation (Non-Medical): No  Physical Activity: Insufficiently Active (03/24/2022)   Exercise Vital Sign    Days of Exercise per Week: 4 days    Minutes of Exercise per Session: 30 min  Stress: No Stress Concern Present (03/24/2022)   [employer] of Occupational Health - Occupational Stress Questionnaire    Feeling of Stress : Not at all  Social Connections: Unknown (05/06/2022)   [facility] from [facility]   Social Network    Social Network: Not on file  Intimate Partner Violence: Unknown (05/06/2022)   Received from [facility]   HITS    Physically Hurt: Not on file    Insult or Talk Down To: Not on file    Threaten Physical Harm: Not on  file    Scream or Curse: Not on file    FAMILY HISTORY: Family History  Problem Relation Age of Onset   Diabetes Mother    Arthritis Mother    Heart disease Mother    Hyperlipidemia Mother    Hypertension Mother    Heart attack Mother 95   Heart disease Brother        had in his 35s. Had CABG x2   Hypothyroidism Daughter    Stroke Daughter     ALLERGIES:  is allergic to ace inhibitors and entresto [sacubitril-valsartan].  MEDICATIONS:  Current Outpatient Medications  Medication Sig Dispense Refill   acetic acid 2 % otic solution      aspirin 81 MG EC tablet Take 1 tablet (81 mg total) by mouth daily.     carvedilol (COREG) 6.25 MG tablet Take 0.5 tablets (3.125 mg total) by mouth 2 (two) times daily with a meal.     Ferrous Gluconate 324 (37.5 Fe) MG TABS TAKE 1 TABLET BY MOUTH TWICE  DAILY WITH MEALS 60 tablet 1   furosemide (LASIX) 20 MG tablet Take 20 mg by mouth daily.     latanoprost (XALATAN) 0.005 % ophthalmic solution Place 1 drop into both eyes daily.     Nutritional Supplements (SUPLENA/CARB STEADY) LIQD Take by mouth.     rosuvastatin (CRESTOR) 40 MG tablet TAKE 1 TABLET BY MOUTH ONCE DAILY AT  6PM 90 tablet 3   traZODone (DESYREL) 150 MG tablet Take 150 mg by mouth at bedtime as needed for sleep.      ezetimibe (ZETIA) 10 MG tablet Take 1 tablet (10 mg total) by mouth daily. 30 tablet 11   spironolactone (ALDACTONE) 25 MG tablet Take 1 tablet (25 mg total) by mouth daily. 90 tablet 3   No current facility-administered medications for this visit.    REVIEW OF SYSTEMS:   Constitutional: ( - ) fevers, ( - )  chills , ( - ) night sweats Eyes: ( - ) blurriness of vision, ( - ) double vision, ( - ) watery eyes Ears, nose, mouth, throat, and face: ( - ) mucositis, ( - ) sore throat Respiratory: ( - ) cough, ( - ) dyspnea, ( - ) wheezes Cardiovascular: ( - ) palpitation, ( - ) chest discomfort, ( - ) lower extremity swelling Gastrointestinal:  ( - ) nausea, ( - )  heartburn, ( - ) change in bowel habits Skin: ( - ) abnormal skin rashes Lymphatics: ( - ) new lymphadenopathy, ( - ) easy bruising Neurological: ( - ) numbness, ( - ) tingling, ( - ) new weaknesses Behavioral/Psych: ( - ) mood change, ( - ) new changes  All other systems were reviewed with the patient and are negative.  PHYSICAL EXAMINATION: ECOG PERFORMANCE STATUS: 0 - Asymptomatic  There were no vitals filed for this visit. There were no vitals filed for this visit.  GENERAL: well appearing male in NAD  SKIN: skin color, texture, turgor are normal, no rashes or significant lesions EYES: conjunctiva are pink and non-injected, sclera clear LUNGS: clear to auscultation and percussion with normal breathing effort HEART: regular rate & rhythm and no murmurs and no lower extremity edema Musculoskeletal: no cyanosis of digits and no clubbing  PSYCH: alert & oriented x 3, fluent speech NEURO: no focal motor/sensory deficits  LABORATORY DATA:  I have reviewed the data as listed    Latest Ref Rng & Units 12/29/2022   12:53 PM 12/26/2021   10:09 AM 06/23/2021    3:14 PM  CBC  WBC 4.0 - 10.5 K/uL 4.9  5.2  6.2   Hemoglobin 13.0 - 17.0 g/dL 95.6  21.3  08.6   Hematocrit 39.0 - 52.0 % 33.6  33.5  30.5   Platelets 150 - 400 K/uL 183  174  178.0        Latest Ref Rng & Units 12/29/2022   12:53 PM 12/26/2021   10:09 AM 06/23/2021    3:14 PM  CMP  Glucose 70 - 99 mg/dL 82  93  77   BUN 8 - 23 mg/dL 28  21  24    Creatinine 0.61 - 1.24 mg/dL 5.78  4.69  6.29   Sodium 135 - 145 mmol/L 140  140  140   Potassium 3.5 - 5.1 mmol/L 4.6  4.3  4.4   Chloride 98 - 111 mmol/L 108  106  109   CO2 22 - 32 mmol/L 26  29  24    Calcium 8.9 - 10.3 mg/dL 9.3  9.4  9.1   Total Protein 6.5 - 8.1 g/dL 7.0  7.2  7.1   Total Bilirubin 0.3 - 1.2 mg/dL 0.5  0.5  0.4   Alkaline Phos 38 - 126 U/L 69  74  77   AST 15 - 41 U/L 24  23  32   ALT 0 - 44 U/L 20  17  27     ASSESSMENT & PLAN Shanan Bennington Trias is a  82 y.o. male who returns for a follow up for IgG Kappa MGUS.   #IgG Kappa MGUS: --Most recent SPEP from 12/26/2021 detected M-protein measuring 0.3 g/dL, overall stable for several years. Kappa free light chains have trended up with most recent level was 108.5 mg/L, lambda free light chain 26.5, Ratio 4.09.  --Most recent bone met survey from 12/08/2010 showed no evidence of lytic lesions PLAN: --Labs from today show stable anemia with Hgb 11.3. No other cytopenias. Creatinine level is stable at 1.53. Normal calcium levels. Pending SPEP and serum free light chains.  --Discussed that based on pending SPEP and serum free light chain results, will evaluate need for bone marrow biopsy --Due for 24 hour UPEP, provided collection container today --Due for bone met survey, scheduled for 01/05/2023.  --Tentative RTC in 6 months in MGUS clinic unless above workup requires any intervention.   No orders of the defined types were placed in this encounter.   All questions were answered. The patient knows to call the clinic with any problems, questions or concerns.  I have spent a total of 25 minutes minutes of face-to-face and non-face-to-face time, preparing to see the patient,  performing a medically appropriate examination, counseling and educating the patient, ordering tests/procedures,  documenting clinical information in the electronic health record, independently interpreting results and communicating results to the patient, and care coordination.   Georga Kaufmann, PA-C Department of Hematology/Oncology [facility] at [facility] Phone: [phone]

## 2022-12-30 ENCOUNTER — Other Ambulatory Visit: Payer: Medicare Other

## 2022-12-30 ENCOUNTER — Ambulatory Visit: Payer: Medicare Other | Admitting: Oncology

## 2022-12-30 LAB — KAPPA/LAMBDA LIGHT CHAINS
Kappa free light chain: 81 mg/L — ABNORMAL HIGH (ref 3.3–19.4)
Kappa, lambda light chain ratio: 3.52 — ABNORMAL HIGH (ref 0.26–1.65)
Lambda free light chains: 23 mg/L (ref 5.7–26.3)

## 2022-12-31 DIAGNOSIS — D472 Monoclonal gammopathy: Secondary | ICD-10-CM | POA: Diagnosis not present

## 2022-12-31 DIAGNOSIS — I2510 Atherosclerotic heart disease of native coronary artery without angina pectoris: Secondary | ICD-10-CM | POA: Diagnosis not present

## 2022-12-31 DIAGNOSIS — E782 Mixed hyperlipidemia: Secondary | ICD-10-CM | POA: Diagnosis not present

## 2022-12-31 DIAGNOSIS — N189 Chronic kidney disease, unspecified: Secondary | ICD-10-CM | POA: Diagnosis not present

## 2022-12-31 DIAGNOSIS — D638 Anemia in other chronic diseases classified elsewhere: Secondary | ICD-10-CM | POA: Diagnosis not present

## 2022-12-31 DIAGNOSIS — I130 Hypertensive heart and chronic kidney disease with heart failure and stage 1 through stage 4 chronic kidney disease, or unspecified chronic kidney disease: Secondary | ICD-10-CM | POA: Diagnosis not present

## 2023-01-01 LAB — MULTIPLE MYELOMA PANEL, SERUM
Albumin SerPl Elph-Mcnc: 3.3 g/dL (ref 2.9–4.4)
Albumin/Glob SerPl: 1.1 (ref 0.7–1.7)
Alpha 1: 0.2 g/dL (ref 0.0–0.4)
Alpha2 Glob SerPl Elph-Mcnc: 0.7 g/dL (ref 0.4–1.0)
B-Globulin SerPl Elph-Mcnc: 0.8 g/dL (ref 0.7–1.3)
Gamma Glob SerPl Elph-Mcnc: 1.4 g/dL (ref 0.4–1.8)
Globulin, Total: 3.1 g/dL (ref 2.2–3.9)
IgA: 167 mg/dL (ref 61–437)
IgG (Immunoglobin G), Serum: 1518 mg/dL (ref 603–1613)
IgM (Immunoglobulin M), Srm: 148 mg/dL — ABNORMAL HIGH (ref 15–143)
M Protein SerPl Elph-Mcnc: 0.4 g/dL — ABNORMAL HIGH
Total Protein ELP: 6.4 g/dL (ref 6.0–8.5)

## 2023-01-05 ENCOUNTER — Ambulatory Visit (HOSPITAL_COMMUNITY)
Admission: RE | Admit: 2023-01-05 | Discharge: 2023-01-05 | Disposition: A | Discharge status: Home / Self Care | Payer: Medicare Other | Source: Ambulatory Visit | Attending: Physician Assistant | Admitting: Physician Assistant

## 2023-01-05 DIAGNOSIS — I429 Cardiomyopathy, unspecified: Secondary | ICD-10-CM | POA: Diagnosis not present

## 2023-01-05 DIAGNOSIS — M47819 Spondylosis without myelopathy or radiculopathy, site unspecified: Secondary | ICD-10-CM | POA: Diagnosis not present

## 2023-01-05 DIAGNOSIS — D631 Anemia in chronic kidney disease: Secondary | ICD-10-CM | POA: Diagnosis not present

## 2023-01-05 DIAGNOSIS — N1831 Chronic kidney disease, stage 3a: Secondary | ICD-10-CM | POA: Diagnosis not present

## 2023-01-05 DIAGNOSIS — I129 Hypertensive chronic kidney disease with stage 1 through stage 4 chronic kidney disease, or unspecified chronic kidney disease: Secondary | ICD-10-CM | POA: Diagnosis not present

## 2023-01-05 DIAGNOSIS — D472 Monoclonal gammopathy: Secondary | ICD-10-CM | POA: Diagnosis not present

## 2023-01-06 LAB — UPEP/UIFE/LIGHT CHAINS/TP, 24-HR UR
% BETA, Urine: 0 %
ALPHA 1 URINE: 0 %
Albumin, U: 100 %
Alpha 2, Urine: 0 %
Free Kappa Lt Chains,Ur: 16.35 mg/L (ref 1.17–86.46)
Free Kappa/Lambda Ratio: 10.76 (ref 1.83–14.26)
Free Lambda Lt Chains,Ur: 1.52 mg/L (ref 0.27–15.21)
GAMMA GLOBULIN URINE: 0 %
Total Protein, Urine-Ur/day: 221 mg/(24.h) — ABNORMAL HIGH (ref 30–150)
Total Protein, Urine: 13.4 mg/dL
Total Volume: 1650

## 2023-01-11 ENCOUNTER — Ambulatory Visit (HOSPITAL_BASED_OUTPATIENT_CLINIC_OR_DEPARTMENT_OTHER): Payer: Medicare Other

## 2023-01-11 ENCOUNTER — Ambulatory Visit (HOSPITAL_BASED_OUTPATIENT_CLINIC_OR_DEPARTMENT_OTHER): Payer: Medicare Other | Admitting: Student

## 2023-01-11 ENCOUNTER — Encounter (HOSPITAL_BASED_OUTPATIENT_CLINIC_OR_DEPARTMENT_OTHER): Payer: Self-pay | Admitting: Student

## 2023-01-11 DIAGNOSIS — M1712 Unilateral primary osteoarthritis, left knee: Secondary | ICD-10-CM | POA: Diagnosis not present

## 2023-01-11 DIAGNOSIS — M25562 Pain in left knee: Secondary | ICD-10-CM | POA: Diagnosis not present

## 2023-01-11 MED ORDER — LIDOCAINE HCL 1 % IJ SOLN
4.0000 mL | INTRAMUSCULAR | Status: AC | PRN
  Administered 2023-01-11: 4 mL

## 2023-01-11 MED ORDER — TRIAMCINOLONE ACETONIDE 40 MG/ML IJ SUSP
2.0000 mL | INTRAMUSCULAR | Status: AC | PRN
  Administered 2023-01-11: 2 mL via INTRA_ARTICULAR

## 2023-01-11 NOTE — Progress Notes (Signed)
Chief Complaint: Left knee pain     History of Present Illness:    Luis Chen is a 82 y.o. male presenting today for evaluation of left knee pain.  Patient states that this began yesterday without any known injury or cause and has been slowly worsening.  He does have pain to weight-bear and his knee appears swollen.  Pain levels are moderate.  He is using a cane for gait assistance.  Denies having any issues with his left knee since 2019.  He has had fluid aspirated before and states no infection or gout was found.  He has tried using a knee brace and Aspercreme.  No fever or chills.  He enjoys bowling and participates in a league twice a week.  Also an Investment banker, operational.   Surgical History:   Left knee I&D -2017  PMH/PSH/Family History/Social History/Meds/Allergies:    Past Medical History:  Diagnosis Date   Anemia, chronic disease 07/01/2015   Angina decubitus (HCC)    Benign essential hypertension 10/31/2008   Qualifier: Diagnosis of  By: Antoine Poche, MD, Gerrit Heck     CAD (coronary artery disease)    non obstructive. Left main normal. LAd proximal long 25% stenosis, termingating as focal 50% prox to mid lesion. First & second diag were small, normal. Circumflex in proximal av groove had luminal irregularities.. Was large mid obtuse marg which was branching, scattered luminal irregul. Infer branch did supply some septal perforators. Was PDA,small & normal.  < 1.87mm. There was prox 95% stenosis   CARDIOMYOPATHY 11/27/2008   CKD (chronic kidney disease)    Hyperlipidemia    transient history, controlled on diet   Hypertension    Mixed hyperlipidemia 10/30/2008   Qualifier: Diagnosis of  By: Cristela Felt, CNA, [name]     Non-traumatic rhabdomyolysis 07/03/2015   Polyarthropathy 07/01/2015   Primary osteoarthritis of knees, bilateral 12/10/2014   Pyogenic arthritis of left knee joint (HCC)    Swelling of left knee joint 07/01/2015   Systolic HF (heart failure)  (HCC) 02/08/2018   Past Surgical History:  Procedure Laterality Date   CORONARY ARTERY BYPASS GRAFT N/A 02/17/2018   Procedure: CORONARY ARTERY BYPASS GRAFTING (CABG);  Surgeon: Alleen Borne, MD;  Location: [facility] OR;  Service: Open Heart Surgery;  Laterality: N/A;  Times 3 using endoscopically harvested right saphenous vein.     KNEE ARTHROSCOPY Left 07/01/2015   Procedure: Irrigation and debridement left knee arthroscopy;  Surgeon: Sheral Apley, MD;  Location: [facility] OR;  Service: Orthopedics;  Laterality: Left;   RIGHT/LEFT HEART CATH AND CORONARY ANGIOGRAPHY N/A 02/08/2018   Procedure: RIGHT/LEFT HEART CATH AND CORONARY ANGIOGRAPHY;  Surgeon: Corky Crafts, MD;  Location: [facility] INVASIVE CV LAB;  Service: Cardiovascular;  Laterality: N/A;   TEE WITHOUT CARDIOVERSION N/A 02/17/2018   Procedure: TRANSESOPHAGEAL ECHOCARDIOGRAM (TEE);  Surgeon: Alleen Borne, MD;  Location: [facility] OR;  Service: Open Heart Surgery;  Laterality: N/A;   Social History   Socioeconomic History   Marital status: Widowed    Spouse name: Not on file   Number of children: Not on file   Years of education: Not on file   Highest education level: Not on file  Occupational History   Not on file  Tobacco Use   Smoking status: Never   Smokeless tobacco: Never  Vaping Use  Vaping status: Never Used  Substance and Sexual Activity   Alcohol use: Yes    Comment: one glass of wine or beer occassionaly   Drug use: No   Sexual activity: Yes  Other Topics Concern   Not on file  Social History Narrative   Lives with grandson (54 yo), wife passed on Jan 26, 2001. 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    Enjoys bowling- Is in a bowling league    02/11/2018 @ 1352 Pt discharged home with niece. No s/s of distress noted. Education completed, discharge instructions provided and understanding verbalized. Iv sites removed *2. Occlusive drsg per procedure-cdi. No c/o pain/nor  distress.   Social Determinants of Health   Financial Resource Strain: Low Risk  (03/24/2022)   Overall Financial Resource Strain (CARDIA)    Difficulty of Paying Living Expenses: Not hard at all  Food Insecurity: No Food Insecurity (03/24/2022)   Hunger Vital Sign    Worried About Running Out of Food in the Last Year: Never true    Ran Out of Food in the Last Year: Never true  Transportation Needs: No Transportation Needs (03/24/2022)   PRAPARE - Administrator, Civil Service (Medical): No    Lack of Transportation (Non-Medical): No  Physical Activity: Insufficiently Active (03/24/2022)   Exercise Vital Sign    Days of Exercise per Week: 4 days    Minutes of Exercise per Session: 30 min  Stress: No Stress Concern Present (03/24/2022)   [employer] of Occupational Health - Occupational Stress Questionnaire    Feeling of Stress : Not at all  Social Connections: Unknown (05/06/2022)   Received [facility]   Social Network    Social Network: Not on file   Family History  Problem Relation Age of Onset   Diabetes Mother    Arthritis Mother    Heart disease Mother    Hyperlipidemia Mother    Hypertension Mother    Heart attack Mother 33   Heart disease Brother        had in his 20s. Had CABG x2   Hypothyroidism Daughter    Stroke Daughter    Allergies  Allergen Reactions   Ace Inhibitors Swelling    Facial swelling    Entresto [Sacubitril-Valsartan] Swelling    Angioedema with ACE Inhibitor   Current Outpatient Medications  Medication Sig Dispense Refill   acetic acid 2 % otic solution      aspirin 81 MG EC tablet Take 1 tablet (81 mg total) by mouth daily.     carvedilol (COREG) 6.25 MG tablet Take 0.5 tablets (3.125 mg total) by mouth 2 (two) times daily with a meal.     ezetimibe (ZETIA) 10 MG tablet Take 1 tablet (10 mg total) by mouth daily. 30 tablet 11   Ferrous Gluconate 324 (37.5 Fe) MG TABS TAKE 1 TABLET BY MOUTH TWICE DAILY  WITH MEALS 60 tablet 1   furosemide (LASIX) 20 MG tablet Take 20 mg by mouth daily.     latanoprost (XALATAN) 0.005 % ophthalmic solution Place 1 drop into both eyes daily.     Nutritional Supplements (SUPLENA/CARB STEADY) LIQD Take by mouth.     rosuvastatin (CRESTOR) 40 MG tablet TAKE 1 TABLET BY MOUTH ONCE DAILY AT  6PM 90 tablet 3   spironolactone (ALDACTONE) 25 MG tablet Take 1 tablet (25 mg total) by mouth daily. 90 tablet 3   traZODone (DESYREL) 150  MG tablet Take 150 mg by mouth at bedtime as needed for sleep.      No current facility-administered medications for this visit.   No results found.  Review of Systems:   A ROS was performed including pertinent positives and negatives as documented in the HPI.  Physical Exam :   Constitutional: NAD and appears stated age Neurological: Alert and oriented Psych: Appropriate affect and cooperative There were no vitals taken for this visit.   Comprehensive Musculoskeletal Exam:    Patient ambulating with antalgic gait with use of a cane.  Varus deformity noted of the left knee.  Positive medial joint line tenderness with a moderate effusion present.  Active range of motion from 10 to 90 degrees with moderate crepitus.  No instability with varus or valgus stress.  Imaging:   Xray (left knee 4 views): Tricompartmental osteoarthritis that is severe in the medial compartment with almost complete loss of joint space.  Lateral compartment chondrocalcinosis noted that was present in prior x-ray in 2017.  Diffuse osteophytes.   I personally reviewed and interpreted the radiographs.   Assessment:   82 y.o. male with advanced osteoarthritis of the left knee.  Pain just began yesterday and he does have a moderate-sized effusion present.  Denies any previous treatments for this knee outside of an aspiration.  Given this I do believe he would benefit from an aspiration and cortisone injection of which he is agreeable to.  I was able to aspirate 30  mL of fluid from the knee followed by cortisone injection and compression wrap.  I have encouraged continual low impact activity.  He would be a candidate for a total knee arthroplasty however does not want any type of surgery.  We will plan to assess relief with the injection and he can return to clinic as needed.  Plan :    -Aspiration and cortisone injection performed of left knee today after patient consent -Return to clinic as needed use     Procedure Note  Patient: Luis Chen             Date of Birth: 02-11-1941           MRN: [id number]             Visit Date: 01/11/2023  Procedures: Visit Diagnoses:  1. Unilateral primary osteoarthritis, left knee     Large Joint Inj: L knee on 01/11/2023 1:06 PM Indications: pain and joint swelling Details: 18 G 1.5 in needle, superolateral approach Medications: 4 mL lidocaine 1 %; 2 mL triamcinolone acetonide 40 MG/ML Aspirate: 30 mL yellow and serous Outcome: tolerated well, no immediate complications Procedure, treatment alternatives, risks and benefits explained, specific risks discussed. Consent was given by the patient. Immediately prior to procedure a time out was called to verify the correct patient, procedure, equipment, support staff and site/side marked as required. Patient was prepped and draped in the usual sterile fashion.      I personally saw and evaluated the patient, and participated in the management and treatment plan.  Hazle Nordmann, PA-C Orthopedics

## 2023-03-09 ENCOUNTER — Encounter (HOSPITAL_BASED_OUTPATIENT_CLINIC_OR_DEPARTMENT_OTHER): Payer: Self-pay | Admitting: Emergency Medicine

## 2023-03-09 ENCOUNTER — Emergency Department (HOSPITAL_BASED_OUTPATIENT_CLINIC_OR_DEPARTMENT_OTHER)
Admission: EM | Admit: 2023-03-09 | Discharge: 2023-03-09 | Disposition: A | Discharge status: Home / Self Care | Payer: Medicare Other | Attending: Emergency Medicine | Admitting: Emergency Medicine

## 2023-03-09 ENCOUNTER — Other Ambulatory Visit: Payer: Self-pay

## 2023-03-09 DIAGNOSIS — Z7982 Long term (current) use of aspirin: Secondary | ICD-10-CM | POA: Insufficient documentation

## 2023-03-09 DIAGNOSIS — M25562 Pain in left knee: Secondary | ICD-10-CM | POA: Diagnosis not present

## 2023-03-09 DIAGNOSIS — R799 Abnormal finding of blood chemistry, unspecified: Secondary | ICD-10-CM | POA: Diagnosis not present

## 2023-03-09 DIAGNOSIS — M25462 Effusion, left knee: Secondary | ICD-10-CM | POA: Diagnosis not present

## 2023-03-09 DIAGNOSIS — M1120 Other chondrocalcinosis, unspecified site: Secondary | ICD-10-CM

## 2023-03-09 LAB — CBC WITH DIFFERENTIAL/PLATELET
Abs Immature Granulocytes: 0.05 10*3/uL (ref 0.00–0.07)
Basophils Absolute: 0 10*3/uL (ref 0.0–0.1)
Basophils Relative: 0 %
Eosinophils Absolute: 0 10*3/uL (ref 0.0–0.5)
Eosinophils Relative: 0 %
HCT: 35.5 % — ABNORMAL LOW (ref 39.0–52.0)
Hemoglobin: 12 g/dL — ABNORMAL LOW (ref 13.0–17.0)
Immature Granulocytes: 1 %
Lymphocytes Relative: 12 %
Lymphs Abs: 1.3 10*3/uL (ref 0.7–4.0)
MCH: 31.9 pg (ref 26.0–34.0)
MCHC: 33.8 g/dL (ref 30.0–36.0)
MCV: 94.4 fL (ref 80.0–100.0)
Monocytes Absolute: 1.6 10*3/uL — ABNORMAL HIGH (ref 0.1–1.0)
Monocytes Relative: 15 %
Neutro Abs: 8 10*3/uL — ABNORMAL HIGH (ref 1.7–7.7)
Neutrophils Relative %: 72 %
Platelets: 169 10*3/uL (ref 150–400)
RBC: 3.76 MIL/uL — ABNORMAL LOW (ref 4.22–5.81)
RDW: 14.6 % (ref 11.5–15.5)
WBC: 10.9 10*3/uL — ABNORMAL HIGH (ref 4.0–10.5)
nRBC: 0 % (ref 0.0–0.2)

## 2023-03-09 LAB — COMPREHENSIVE METABOLIC PANEL
ALT: 39 U/L (ref 0–44)
AST: 61 U/L — ABNORMAL HIGH (ref 15–41)
Albumin: 4 g/dL (ref 3.5–5.0)
Alkaline Phosphatase: 88 U/L (ref 38–126)
Anion gap: 10 (ref 5–15)
BUN: 14 mg/dL (ref 8–23)
CO2: 26 mmol/L (ref 22–32)
Calcium: 9.7 mg/dL (ref 8.9–10.3)
Chloride: 102 mmol/L (ref 98–111)
Creatinine, Ser: 1.29 mg/dL — ABNORMAL HIGH (ref 0.61–1.24)
GFR, Estimated: 55 mL/min — ABNORMAL LOW (ref >60)
Glucose, Bld: 142 mg/dL — ABNORMAL HIGH (ref 70–99)
Potassium: 4 mmol/L (ref 3.5–5.1)
Sodium: 138 mmol/L (ref 135–145)
Total Bilirubin: 1 mg/dL (ref 0.0–1.2)
Total Protein: 7.4 g/dL (ref 6.5–8.1)

## 2023-03-09 LAB — SYNOVIAL CELL COUNT + DIFF, W/ CRYSTALS
Eosinophils-Synovial: 0 % (ref 0–1)
Lymphocytes-Synovial Fld: 2 % (ref 0–20)
Monocyte-Macrophage-Synovial Fluid: 7 % — ABNORMAL LOW (ref 50–90)
Neutrophil, Synovial: 91 % — ABNORMAL HIGH (ref 0–25)
WBC, Synovial: 46200 /mm3 — ABNORMAL HIGH (ref 0–200)

## 2023-03-09 LAB — SEDIMENTATION RATE: Sed Rate: 68 mm/h — ABNORMAL HIGH (ref 0–16)

## 2023-03-09 LAB — LACTIC ACID, PLASMA: Lactic Acid, Venous: 1.9 mmol/L (ref 0.5–1.9)

## 2023-03-09 LAB — CBG MONITORING, ED: Glucose-Capillary: 134 mg/dL — ABNORMAL HIGH (ref 70–99)

## 2023-03-09 LAB — C-REACTIVE PROTEIN: CRP: 20.3 mg/dL — ABNORMAL HIGH (ref <1.0)

## 2023-03-09 MED ORDER — LIDOCAINE HCL (PF) 1 % IJ SOLN
30.0000 mL | Freq: Once | INTRAMUSCULAR | Status: DC
  Filled 2023-03-09: qty 30

## 2023-03-09 MED ORDER — PREDNISONE 20 MG PO TABS: 20.0000 mg | ORAL_TABLET | Freq: Every day | ORAL | 0 refills | Status: AC

## 2023-03-09 MED ORDER — LIDOCAINE HCL 1 % IJ SOLN
INTRAMUSCULAR | Status: AC
  Filled 2023-03-09: qty 20

## 2023-03-09 MED ORDER — OXYCODONE-ACETAMINOPHEN 5-325 MG PO TABS: 1.0000 | ORAL_TABLET | Freq: Four times a day (QID) | ORAL | 0 refills | Status: DC | PRN

## 2023-03-09 MED ORDER — HYDROCODONE-ACETAMINOPHEN 5-325 MG PO TABS
1.0000 | ORAL_TABLET | Freq: Once | ORAL | Status: AC
  Administered 2023-03-09: 1 via ORAL
  Filled 2023-03-09: qty 1

## 2023-03-09 NOTE — ED Provider Notes (Signed)
 Care assumed from previous provider.  See note for full HPI.  82 year old here for left knee pain and swelling.  He has had similar, was seen 1 month ago and had aspiration injection due to swelling.  He has had surgery on his left knee due to septic joint previously.  No recent falls or injuries.  Joint aspiration by previous provider.  Plan to follow-up on labs  If no septic joint can DC home with pain management  Physical Exam  BP (!) 145/70   Pulse (!) 109   Temp 99.8 F (37.7 C) (Oral)   Resp (!) 24   SpO2 99%   Physical Exam Vitals and nursing note reviewed.  Constitutional:      General: He is not in acute distress.    Appearance: He is well-developed. He is not ill-appearing or diaphoretic.  HENT:     Head: Atraumatic.  Eyes:     Pupils: Pupils are equal, round, and reactive to light.  Cardiovascular:     Rate and Rhythm: Normal rate and regular rhythm.     Pulses: Normal pulses.          Popliteal pulses are 2+ on the right side and 2+ on the left side.     Heart sounds: Normal heart sounds.  Pulmonary:     Effort: Pulmonary effort is normal. No respiratory distress.     Breath sounds: Normal breath sounds.  Abdominal:     General: Bowel sounds are normal. There is no distension.     Palpations: Abdomen is soft.  Musculoskeletal:        General: No deformity or signs of injury. Normal range of motion.     Cervical back: Normal range of motion and neck supple.     Comments: Joint effusion. Full ROM. No erythema, warmth  Skin:    General: Skin is warm and dry.  Neurological:     General: No focal deficit present.     Mental Status: He is alert and oriented to person, place, and time.     Procedures  Procedures Labs Reviewed  SEDIMENTATION RATE - Abnormal; Notable for the following components:      Result Value   Sed Rate 68 (*)    All other components within normal limits  C-REACTIVE PROTEIN - Abnormal; Notable for the following components:   CRP 20.3  (*)    All other components within normal limits  COMPREHENSIVE METABOLIC PANEL - Abnormal; Notable for the following components:   Glucose, Bld 142 (*)    Creatinine, Ser 1.29 (*)    AST 61 (*)    GFR, Estimated 55 (*)    All other components within normal limits  CBC WITH DIFFERENTIAL/PLATELET - Abnormal; Notable for the following components:   WBC 10.9 (*)    RBC 3.76 (*)    Hemoglobin 12.0 (*)    HCT 35.5 (*)    Neutro Abs 8.0 (*)    Monocytes Absolute 1.6 (*)    All other components within normal limits  SYNOVIAL CELL COUNT + DIFF, W/ CRYSTALS - Abnormal; Notable for the following components:   Color, Synovial RED (*)    Appearance-Synovial CLOUDY (*)    WBC, Synovial 46,200 (*)    Neutrophil, Synovial 91 (*)    Monocyte-Macrophage-Synovial Fluid 7 (*)    All other components within normal limits  CBG MONITORING, ED - Abnormal; Notable for the following components:   Glucose-Capillary 134 (*)    All other components within normal  limits  BODY FLUID CULTURE W GRAM STAIN  CULTURE, BLOOD (ROUTINE X 2)  CULTURE, BLOOD (ROUTINE X 2)  LACTIC ACID, PLASMA  GLUCOSE, BODY FLUID OTHER             No results found.  ED Course / MDM   Clinical Course as of 03/09/23 2228  Tue Mar 09, 2023  1714 WBC(!): 10.9 [AH]  1714 Hemoglobin(!): 12.0 [AH]  1843 Knee swelling, FU on aspiration, bloody fluid on aspiration. Pain meds and dc home if aspiration neg [BH]    Clinical Course User Index [AH] Harris, Abigail, PA-C [BH] Ariana Juul A, PA-C   Care assumed from previous provider.  See note for full HPI.  82 year old here for left knee pain and swelling.  He has had similar, was seen 1 month ago and had aspiration injection due to swelling.  He has had surgery on his left knee due to septic joint previously.  No recent falls or injuries.  Joint aspiration by previous provider.  Plan to follow-up on labs  If no septic joint can DC home with pain management  Labs personally  viewed and interpreted:  CBC leukocytosis 10.9 Metabolic panel creatinine 1.29, similar to prior Lactic 1.9 Aspiration with CPPD, discussed with attending. Dr. Zackowski  Will have him follow-up outpatient.  He is neurovascularly intact here.  Low suspicion for septic joint, occult fracture, VTE, dislocation, ischemic limb.  The patient has been appropriately medically screened and/or stabilized in the ED. I have low suspicion for any other emergent medical condition which would require further screening, evaluation or treatment in the ED or require inpatient management.  Patient is hemodynamically stable and in no acute distress.  Patient able to ambulate in department prior to ED.  Evaluation does not show acute pathology that would require ongoing or additional emergent interventions while in the emergency department or further inpatient treatment.  I have discussed the diagnosis with the patient and answered all questions.  Pain is been managed while in the emergency department and patient has no further complaints prior to discharge.  Patient is comfortable with plan discussed in room and is stable for discharge at this time.  I have discussed strict return precautions for returning to the emergency department.  Patient was encouraged to follow-up with PCP/specialist refer to at discharge.     Medical Decision Making Amount and/or Complexity of Data Reviewed External Data Reviewed: labs and notes. Labs: ordered. Decision-making details documented in ED Course.  Risk OTC drugs. Prescription drug management. Decision regarding hospitalization. Diagnosis or treatment significantly limited by social determinants of health.          Persis Graffius A, PA-C 03/09/23 2228    Geraldene Hamilton, MD 03/12/23 2325

## 2023-03-09 NOTE — ED Provider Notes (Signed)
 [place] EMERGENCY DEPARTMENT AT [facility] Provider Note   CSN: [id number] Arrival date & time: 03/09/23  1227     History  Chief Complaint  Patient presents with   Knee Pain    Luis Chen is a 82 y.o. male who presents with severe left knee pain.  Patient has a history of pyogenic-itis of the left knee.  Had a knee injection about a month ago due to recurrent knee swelling.  He has had a surgery on the left knee due to pyogenic arthritis in the past.  He denies fevers or chills.  He complains of severe pain in the left knee progressive over the last few days now unable to bear weight.   Knee Pain      Home Medications Prior to Admission medications   Medication Sig Start Date End Date Taking? Authorizing Provider  cyclobenzaprine  (FLEXERIL ) 10 MG tablet Take 5 mg by mouth 3 (three) times daily. *twice daily and at bedtime for muscle pain/cramping* 03/08/23  Yes [provider]  diclofenac Sodium (VOLTAREN) 1 % GEL Apply topically See admin instructions. APPLY 2 GRAMS TO AFFECTED AREA FOUR TIMES A DAY AS NEEDED FOR UPPER EXTREMITY JOINT PAIN FOR USE WHEN NOT TAKING THE SAME MEDICATION BY MOUTH OR OTHER NSAID MEDICATION BY MOUTH FOR USE WHEN NOT TAKING THE SAME MEDICATION BY MOUTH OR OTHER NSAID MEDICATION BY MOUTH 03/08/23  Yes [provider]  acetic acid  2 % otic solution  12/26/19   [provider]  aspirin  81 MG EC tablet Take 1 tablet (81 mg total) by mouth daily. 07/28/19   Meng, Hao, PA  carvedilol  (COREG ) 6.25 MG tablet Take 0.5 tablets (3.125 mg total) by mouth 2 (two) times daily with a meal. 05/28/20   [name], MD  ezetimibe  (ZETIA ) 10 MG tablet Take 1 tablet (10 mg total) by mouth daily. 12/05/18 05/26/21  Rolan Ezra RAMAN, MD  Ferrous Gluconate  324 (37.5 Fe) MG TABS TAKE 1 TABLET BY MOUTH TWICE DAILY WITH MEALS 02/08/18   Shadad, Firas N, MD  furosemide  (LASIX ) 20 MG tablet Take 20 mg by mouth daily.    [provider]  latanoprost  (XALATAN ) 0.005 % ophthalmic solution Place 1 drop into both eyes daily. 01/22/17   [provider]  Nutritional Supplements (SUPLENA/CARB STEADY) LIQD Take by mouth.    [provider]  rosuvastatin  (CRESTOR ) 40 MG tablet TAKE 1 TABLET BY MOUTH ONCE DAILY AT  6PM 11/07/18   [name], MD  spironolactone  (ALDACTONE ) 25 MG tablet Take 1 tablet (25 mg total) by mouth daily. 12/06/18 05/26/21  Rolan Ezra RAMAN, MD  traZODone  (DESYREL ) 150 MG tablet Take 150 mg by mouth at bedtime as needed for sleep.     [provider]      Allergies    Ace inhibitors and Entresto  [sacubitril-valsartan]    Review of Systems   Review of Systems  Physical Exam Updated Vital Signs BP (!) 159/85 (BP Location: Right Arm)   Pulse 98   Temp 97.9 F (36.6 C) (Oral)   Resp 18   SpO2 100%  Physical Exam Vitals and nursing note reviewed.  Constitutional:      General: He is not in acute distress.    Appearance: He is well-developed. He is not diaphoretic.  HENT:     Head: Normocephalic and atraumatic.  Eyes:     General: No scleral icterus.    Conjunctiva/sclera: Conjunctivae normal.  Cardiovascular:  Rate and Rhythm: Normal rate and regular rhythm.     Heart sounds: Normal heart sounds.  Pulmonary:     Effort: Pulmonary effort is normal. No respiratory distress.     Breath sounds: Normal breath sounds.  Abdominal:     Palpations: Abdomen is soft.     Tenderness: There is no abdominal tenderness.  Musculoskeletal:     Cervical back: Normal range of motion and neck supple.     Comments: Left knee is swollen, held in mid flexion, warm to the touch, severely tender with even minimal movement.  Skin:    General: Skin is warm and dry.  Neurological:     Mental Status: He is alert.  Psychiatric:        Behavior: Behavior normal.    ED Results / Procedures / Treatments   Labs (all labs ordered are listed, but only abnormal results are  displayed) Labs Reviewed - No data to display  EKG None  Radiology No results found.  Procedures .Joint Aspiration/Arthrocentesis  Date/Time: 03/09/2023 6:44 PM  Performed by: Arloa Chroman, PA-C Authorized by: Arloa Chroman, PA-C   Consent:    Consent obtained:  Verbal   Consent given by:  Patient   Risks discussed:  Bleeding, incomplete drainage, infection and pain     Medications Ordered in ED Medications  lidocaine  (PF) (XYLOCAINE ) 1 % injection 30 mL (30 mLs Infiltration Given 03/09/23 1524)    ED Course/ Medical Decision Making/ A&P Clinical Course as of 03/09/23 1844  Tue Mar 09, 2023  1714 WBC(!): 10.9 [AH]  1714 Hemoglobin(!): 12.0 [AH]  1843 Knee swelling, FU on aspiration, bloody fluid on aspiration. Pain meds and dc home if aspiration neg [BH]    Clinical Course User Index [AH] Ashlin Hidalgo, PA-C [BH] Henderly, Britni A, PA-C                                 Medical Decision Making Amount and/or Complexity of Data Reviewed Labs: ordered. Decision-making details documented in ED Course.  Risk Prescription drug management.           Final Clinical Impression(s) / ED Diagnoses Final diagnoses:  None    Rx / DC Orders ED Discharge Orders     None         Arloa Chroman, PA-C 03/12/23 1309    Geraldene Hamilton, MD 03/12/23 2342

## 2023-03-09 NOTE — Discharge Instructions (Signed)
 Take the medication as prescribed  Please use caution as the pain medicine, Percocet contains Tylenol  as well as a controlled substance.  This may make you sleepy and more unsteady on your feet.  Take as needed  Make sure to follow-up with your orthopedist  Return for new or worsening symptoms

## 2023-03-11 ENCOUNTER — Encounter (HOSPITAL_BASED_OUTPATIENT_CLINIC_OR_DEPARTMENT_OTHER): Payer: Self-pay | Admitting: Student

## 2023-03-11 ENCOUNTER — Ambulatory Visit (HOSPITAL_BASED_OUTPATIENT_CLINIC_OR_DEPARTMENT_OTHER): Payer: Medicare Other

## 2023-03-11 ENCOUNTER — Ambulatory Visit (HOSPITAL_BASED_OUTPATIENT_CLINIC_OR_DEPARTMENT_OTHER): Payer: Medicare Other | Admitting: Student

## 2023-03-11 DIAGNOSIS — M25462 Effusion, left knee: Secondary | ICD-10-CM | POA: Diagnosis not present

## 2023-03-11 DIAGNOSIS — M85862 Other specified disorders of bone density and structure, left lower leg: Secondary | ICD-10-CM | POA: Diagnosis not present

## 2023-03-11 DIAGNOSIS — M11862 Other specified crystal arthropathies, left knee: Secondary | ICD-10-CM | POA: Diagnosis not present

## 2023-03-11 DIAGNOSIS — M25562 Pain in left knee: Secondary | ICD-10-CM | POA: Diagnosis not present

## 2023-03-11 DIAGNOSIS — M1712 Unilateral primary osteoarthritis, left knee: Secondary | ICD-10-CM | POA: Diagnosis not present

## 2023-03-11 NOTE — Progress Notes (Signed)
 Chief Complaint: Left knee pain     History of Present Illness:   03/11/23: Patient is here today for follow-up of his left knee.  Reports that he began developing pain and swelling in the knee about 4 days ago and was seen in the emergency department on 12/31 for evaluation.  Synovial fluid analysis at that time demonstrated presence of pseudogout and he was started on prednisone .  Has also been taking Tylenol .  He has begun to get some relief and was able to begin weightbearing today with use of a cane.  Last cortisone injection was 11/4 and this did give him good relief.   Luis Chen is a 83 y.o. male presenting today for evaluation of left knee pain.  Patient states that this began yesterday without any known injury or cause and has been slowly worsening.  He does have pain to weight-bear and his knee appears swollen.  Pain levels are moderate.  He is using a cane for gait assistance.  Denies having any issues with his left knee since 2019.  He has had fluid aspirated before and states no infection or gout was found.  He has tried using a knee brace and Aspercreme.  No fever or chills.  He enjoys bowling and participates in a league twice a week.  Also an Investment banker, operational.   Surgical History:   Left knee I&D -2017  PMH/PSH/Family History/Social History/Meds/Allergies:    Past Medical History:  Diagnosis Date   Anemia, chronic disease 07/01/2015   Angina decubitus (HCC)    Benign essential hypertension 10/31/2008   Qualifier: Diagnosis of  By: Lavona, MD, CODY Agent     CAD (coronary artery disease)    non obstructive. Left main normal. LAd proximal long 25% stenosis, termingating as focal 50% prox to mid lesion. First & second diag were small, normal. Circumflex in proximal av groove had luminal irregularities.. Was large mid obtuse marg which was branching, scattered luminal irregul. Infer branch did supply some septal perforators. Was PDA,small &  normal.  < 1.36mm. There was prox 95% stenosis   CARDIOMYOPATHY 11/27/2008   CKD (chronic kidney disease)    Hyperlipidemia    transient history, controlled on diet   Hypertension    Mixed hyperlipidemia 10/30/2008   Qualifier: Diagnosis of  By: Leana, CNA, [name]     Non-traumatic rhabdomyolysis 07/03/2015   Polyarthropathy 07/01/2015   Primary osteoarthritis of knees, bilateral 12/10/2014   Pyogenic arthritis of left knee joint (HCC)    Swelling of left knee joint 07/01/2015   Systolic HF (heart failure) (HCC) 02/08/2018   Past Surgical History:  Procedure Laterality Date   CORONARY ARTERY BYPASS GRAFT N/A 02/17/2018   Procedure: CORONARY ARTERY BYPASS GRAFTING (CABG);  Surgeon: Lucas Dorise POUR, MD;  Location: [facility] OR;  Service: Open Heart Surgery;  Laterality: N/A;  Times 3 using endoscopically harvested right saphenous vein.     KNEE ARTHROSCOPY Left 07/01/2015   Procedure: Irrigation and debridement left knee arthroscopy;  Surgeon: Evalene JONETTA Chancy, MD;  Location: [facility] OR;  Service: Orthopedics;  Laterality: Left;   RIGHT/LEFT HEART CATH AND CORONARY ANGIOGRAPHY N/A 02/08/2018   Procedure: RIGHT/LEFT HEART CATH AND CORONARY ANGIOGRAPHY;  Surgeon: Dann Candyce RAMAN, MD;  Location: [facility] INVASIVE CV LAB;  Service: Cardiovascular;  Laterality: N/A;   TEE WITHOUT CARDIOVERSION  N/A 02/17/2018   Procedure: TRANSESOPHAGEAL ECHOCARDIOGRAM (TEE);  Surgeon: Lucas Dorise POUR, MD;  Location: [facility] OR;  Service: Open Heart Surgery;  Laterality: N/A;   Social History   Socioeconomic History   Marital status: Widowed    Spouse name: Not on file   Number of children: Not on file   Years of education: Not on file   Highest education level: Not on file  Occupational History   Not on file  Tobacco Use   Smoking status: Never   Smokeless tobacco: Never  Vaping Use   Vaping status: Never Used  Substance and Sexual Activity   Alcohol use: Yes    Comment: one glass of wine or beer occassionaly   Drug use: No    Sexual activity: Yes  Other Topics Concern   Not on file  Social History Narrative   Lives with grandson (2 yo), wife passed on 02-24-01. 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    Enjoys bowling- Is in a bowling league    02/11/2018 @ 1352 Pt discharged home with niece. No s/s of distress noted. Education completed, discharge instructions provided and understanding verbalized. Iv sites removed *2. Occlusive drsg per procedure-cdi. No c/o pain/nor distress.   Social Drivers of Corporate Investment Banker Strain: Low Risk  (03/24/2022)   Overall Financial Resource Strain (CARDIA)    Difficulty of Paying Living Expenses: Not hard at all  Food Insecurity: No Food Insecurity (03/24/2022)   Hunger Vital Sign    Worried About Running Out of Food in the Last Year: Never true    Ran Out of Food in the Last Year: Never true  Transportation Needs: No Transportation Needs (03/24/2022)   PRAPARE - Administrator, Civil Service (Medical): No    Lack of Transportation (Non-Medical): No  Physical Activity: Insufficiently Active (03/24/2022)   Exercise Vital Sign    Days of Exercise per Week: 4 days    Minutes of Exercise per Session: 30 min  Stress: No Stress Concern Present (03/24/2022)   [employer] of Occupational Health - Occupational Stress Questionnaire    Feeling of Stress : Not at all  Social Connections: Unknown (05/06/2022)   [facility]   Social Network    Social Network: Not on file   Family History  Problem Relation Age of Onset   Diabetes Mother    Arthritis Mother    Heart disease Mother    Hyperlipidemia Mother    Hypertension Mother    Heart attack Mother 71   Heart disease Brother        had in his 31s. Had CABG x2   Hypothyroidism Daughter    Stroke Daughter    Allergies  Allergen Reactions   Ace Inhibitors Swelling    Facial swelling    Entresto  [Sacubitril-Valsartan]  Swelling    Angioedema with ACE Inhibitor   Current Outpatient Medications  Medication Sig Dispense Refill   acetic acid  2 % otic solution      aspirin  81 MG EC tablet Take 1 tablet (81 mg total) by mouth daily.     carvedilol  (COREG ) 6.25 MG tablet Take 0.5 tablets (3.125 mg total) by mouth 2 (two) times daily with a meal.     cyclobenzaprine  (FLEXERIL ) 10 MG tablet Take 5 mg by mouth 3 (three) times daily. *twice daily and at bedtime for muscle pain/cramping*     diclofenac Sodium (VOLTAREN)  1 % GEL Apply topically See admin instructions. APPLY 2 GRAMS TO AFFECTED AREA FOUR TIMES A DAY AS NEEDED FOR UPPER EXTREMITY JOINT PAIN FOR USE WHEN NOT TAKING THE SAME MEDICATION BY MOUTH OR OTHER NSAID MEDICATION BY MOUTH FOR USE WHEN NOT TAKING THE SAME MEDICATION BY MOUTH OR OTHER NSAID MEDICATION BY MOUTH     ezetimibe  (ZETIA ) 10 MG tablet Take 1 tablet (10 mg total) by mouth daily. 30 tablet 11   Ferrous Gluconate  324 (37.5 Fe) MG TABS TAKE 1 TABLET BY MOUTH TWICE DAILY WITH MEALS 60 tablet 1   furosemide  (LASIX ) 20 MG tablet Take 20 mg by mouth daily.     latanoprost  (XALATAN ) 0.005 % ophthalmic solution Place 1 drop into both eyes daily.     Nutritional Supplements (SUPLENA/CARB STEADY) LIQD Take by mouth.     oxyCODONE -acetaminophen  (PERCOCET/ROXICET) 5-325 MG tablet Take 1 tablet by mouth every 6 (six) hours as needed for severe pain (pain score 7-10). 15 tablet 0   predniSONE  (DELTASONE ) 20 MG tablet Take 1 tablet (20 mg total) by mouth daily for 5 days. 5 tablet 0   rosuvastatin  (CRESTOR ) 40 MG tablet TAKE 1 TABLET BY MOUTH ONCE DAILY AT  6PM 90 tablet 3   spironolactone  (ALDACTONE ) 25 MG tablet Take 1 tablet (25 mg total) by mouth daily. 90 tablet 3   traZODone  (DESYREL ) 150 MG tablet Take 150 mg by mouth at bedtime as needed for sleep.      No current facility-administered medications for this visit.   No results found.  Review of Systems:   A ROS was performed including pertinent  positives and negatives as documented in the HPI.  Physical Exam :   Constitutional: NAD and appears stated age Neurological: Alert and oriented Psych: Appropriate affect and cooperative There were no vitals taken for this visit.   Comprehensive Musculoskeletal Exam:    Left knee exam demonstrates an notable varus deformity.  Active range of motion from 10 to 100 degrees.  Mild joint effusion present with bilateral joint line tenderness.  Using a cane for assistance with ambulation.  Imaging:   Xray (left knee 4 views): End-stage tricompartmental osteoarthritis that is bone-on-bone in the medial compartment.  Joint effusion present.  Chondrocalcinosis noted in the lateral compartment.   I personally reviewed and interpreted the radiographs.   Assessment:   83 y.o. male with acute on chronic left knee pain.  He does have significantly advanced osteoarthritis as well as recent confirmation of pseudogout.  Labs and cultures show no signs of septic arthritis.  Given that last cortisone injection was 2 months ago, I would like to avoid repeating this today if possible.  He is currently on a 5-day course of prednisone  from the ED which I would like him to continue.  May consider extending this out a few days if needed.  Discussed that given the severity of his arthritis, I would recommend following with a joint replacement surgeon to consider management options.  [facility]  Plan :    -Continue current course of prednisone  -Follow-up with orthopedic surgery at the [facility] concerning management of advanced knee osteoarthritis     I personally saw and evaluated the patient, and participated in the management and treatment plan.  Leonce Reveal, PA-C Orthopedics

## 2023-03-12 LAB — GLUCOSE, BODY FLUID OTHER: Glucose, Body Fluid Other: 38 mg/dL

## 2023-03-13 LAB — BODY FLUID CULTURE W GRAM STAIN: Culture: NO GROWTH

## 2023-03-14 LAB — CULTURE, BLOOD (ROUTINE X 2)
Culture: NO GROWTH
Culture: NO GROWTH
Special Requests: ADEQUATE
Special Requests: ADEQUATE

## 2023-04-01 ENCOUNTER — Ambulatory Visit: Payer: Medicare Other

## 2023-04-01 VITALS — Wt 149.0 lb

## 2023-04-01 DIAGNOSIS — Z0000 Encounter for general adult medical examination without abnormal findings: Secondary | ICD-10-CM

## 2023-04-01 NOTE — Progress Notes (Signed)
Subjective:   Luis Chen is a 83 y.o. male who presents for Medicare Annual/Subsequent preventive examination.  Visit Complete: Virtual I connected with  Luis Chen on 04/01/23 by a audio enabled telemedicine application and verified that I am speaking with the correct person using two identifiers.  Patient Location: Home  Provider Location: Office/Clinic  I discussed the limitations of evaluation and management by telemedicine. The patient expressed understanding and agreed to proceed.  Vital Signs: Because this visit was a virtual/telehealth visit, some criteria may be missing or patient reported. Any vitals not documented were not able to be obtained and vitals that have been documented are patient reported.   Cardiac Risk Factors include: advanced age (>12men, >85 women);dyslipidemia;male gender;hypertension     Objective:    Today's Vitals   04/01/23 1340  Weight: 149 lb (67.6 kg)   Body mass index is 21.38 kg/m.     04/01/2023    1:45 PM 03/09/2023    3:26 PM 05/09/2022   11:59 AM 03/24/2022    8:25 AM 01/16/2019    8:20 AM 02/17/2018   10:00 PM 02/16/2018   11:11 AM  Advanced Directives  Does Patient Have a Medical Advance Directive? Yes No No Yes No No No  Type of Estate agent of [place];Living will   Healthcare Power of [place];Living will     Copy of Healthcare Power of Attorney in Chart? No - copy requested   No - copy requested     Would patient like information on creating a medical advance directive?  No - Patient declined No - Patient declined  No - Patient declined No - Patient declined No - Patient declined    Current Medications (verified) Outpatient Encounter Medications as of 04/01/2023  Medication Sig   acetic acid 2 % otic solution    aspirin 81 MG EC tablet Take 1 tablet (81 mg total) by mouth daily.   carvedilol (COREG) 6.25 MG tablet Take 0.5 tablets (3.125 mg total) by mouth 2 (two) times daily with a meal.    cyclobenzaprine (FLEXERIL) 10 MG tablet Take 5 mg by mouth 3 (three) times daily. *twice daily and at bedtime for muscle pain/cramping*   diclofenac Sodium (VOLTAREN) 1 % GEL Apply topically See admin instructions. APPLY 2 GRAMS TO AFFECTED AREA FOUR TIMES A DAY AS NEEDED FOR UPPER EXTREMITY JOINT PAIN FOR USE WHEN NOT TAKING THE SAME MEDICATION BY MOUTH OR OTHER NSAID MEDICATION BY MOUTH FOR USE WHEN NOT TAKING THE SAME MEDICATION BY MOUTH OR OTHER NSAID MEDICATION BY MOUTH   Ferrous Gluconate 324 (37.5 Fe) MG TABS TAKE 1 TABLET BY MOUTH TWICE DAILY WITH MEALS   furosemide (LASIX) 20 MG tablet Take 20 mg by mouth daily.   latanoprost (XALATAN) 0.005 % ophthalmic solution Place 1 drop into both eyes daily.   Nutritional Supplements (SUPLENA/CARB STEADY) LIQD Take by mouth.   rosuvastatin (CRESTOR) 40 MG tablet TAKE 1 TABLET BY MOUTH ONCE DAILY AT  6PM   sildenafil (VIAGRA) 100 MG tablet Take by mouth.   traZODone (DESYREL) 150 MG tablet Take 150 mg by mouth at bedtime as needed for sleep.    ezetimibe (ZETIA) 10 MG tablet Take 1 tablet (10 mg total) by mouth daily.   oxyCODONE-acetaminophen (PERCOCET/ROXICET) 5-325 MG tablet Take 1 tablet by mouth every 6 (six) hours as needed for severe pain (pain score 7-10). (Patient not taking: Reported on 04/01/2023)   spironolactone (ALDACTONE) 25 MG tablet Take 1 tablet (25 mg  total) by mouth daily.   No facility-administered encounter medications on file as of 04/01/2023.    Allergies (verified) Ace inhibitors and Entresto [sacubitril-valsartan]   History: Past Medical History:  Diagnosis Date   Anemia, chronic disease 07/01/2015   Angina decubitus (HCC)    Benign essential hypertension 10/31/2008   Qualifier: Diagnosis of  By: Antoine Poche, MD, Gerrit Heck     CAD (coronary artery disease)    non obstructive. Left main normal. LAd proximal long 25% stenosis, termingating as focal 50% prox to mid lesion. First & second diag were small, normal. Circumflex  in proximal av groove had luminal irregularities.. Was large mid obtuse marg which was branching, scattered luminal irregul. Infer branch did supply some septal perforators. Was PDA,small & normal.  < 1.72mm. There was prox 95% stenosis   CARDIOMYOPATHY 11/27/2008   CKD (chronic kidney disease)    Hyperlipidemia    transient history, controlled on diet   Hypertension    Mixed hyperlipidemia 10/30/2008   Qualifier: Diagnosis of  By: Cristela Felt, CNA, [name]     Non-traumatic rhabdomyolysis 07/03/2015   Polyarthropathy 07/01/2015   Primary osteoarthritis of knees, bilateral 12/10/2014   Pyogenic arthritis of left knee joint (HCC)    Swelling of left knee joint 07/01/2015   Systolic HF (heart failure) (HCC) 02/08/2018   Past Surgical History:  Procedure Laterality Date   CORONARY ARTERY BYPASS GRAFT N/A 02/17/2018   Procedure: CORONARY ARTERY BYPASS GRAFTING (CABG);  Surgeon: Alleen Borne, MD;  Location: [facility] OR;  Service: Open Heart Surgery;  Laterality: N/A;  Times 3 using endoscopically harvested right saphenous vein.     KNEE ARTHROSCOPY Left 07/01/2015   Procedure: Irrigation and debridement left knee arthroscopy;  Surgeon: Sheral Apley, MD;  Location: [facility] OR;  Service: Orthopedics;  Laterality: Left;   RIGHT/LEFT HEART CATH AND CORONARY ANGIOGRAPHY N/A 02/08/2018   Procedure: RIGHT/LEFT HEART CATH AND CORONARY ANGIOGRAPHY;  Surgeon: Corky Crafts, MD;  Location: [facility] INVASIVE CV LAB;  Service: Cardiovascular;  Laterality: N/A;   TEE WITHOUT CARDIOVERSION N/A 02/17/2018   Procedure: TRANSESOPHAGEAL ECHOCARDIOGRAM (TEE);  Surgeon: Alleen Borne, MD;  Location: [facility] OR;  Service: Open Heart Surgery;  Laterality: N/A;   Family History  Problem Relation Age of Onset   Diabetes Mother    Arthritis Mother    Heart disease Mother    Hyperlipidemia Mother    Hypertension Mother    Heart attack Mother 60   Heart disease Brother        had in his 37s. Had CABG x2   Hypothyroidism Daughter     Stroke Daughter    Social History   Socioeconomic History   Marital status: Widowed    Spouse name: Not on file   Number of children: Not on file   Years of education: Not on file   Highest education level: Not on file  Occupational History   Not on file  Tobacco Use   Smoking status: Never   Smokeless tobacco: Never  Vaping Use   Vaping status: Never Used  Substance and Sexual Activity   Alcohol use: Yes    Comment: one glass of wine or beer occassionaly   Drug use: No   Sexual activity: Yes  Other Topics Concern   Not on file  Social History Narrative   Lives with grandson (16 yo), wife passed on 04-21-00. 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    Enjoys bowling-  Is in a bowling league    02/11/2018 @ 1352 Pt discharged home with niece. No s/s of distress noted. Education completed, discharge instructions provided and understanding verbalized. Iv sites removed *2. Occlusive drsg per procedure-cdi. No c/o pain/nor distress.   Social Drivers of Corporate investment banker Strain: Low Risk  (04/01/2023)   Overall Financial Resource Strain (CARDIA)    Difficulty of Paying Living Expenses: Not hard at all  Food Insecurity: No Food Insecurity (04/01/2023)   Hunger Vital Sign    Worried About Running Out of Food in the Last Year: Never true    Ran Out of Food in the Last Year: Never true  Transportation Needs: No Transportation Needs (04/01/2023)   PRAPARE - Administrator, Civil Service (Medical): No    Lack of Transportation (Non-Medical): No  Physical Activity: Sufficiently Active (04/01/2023)   Exercise Vital Sign    Days of Exercise per Week: 5 days    Minutes of Exercise per Session: 30 min  Stress: No Stress Concern Present (04/01/2023)   [employer] of Occupational Health - Occupational Stress Questionnaire    Feeling of Stress : Not at all  Social Connections: Moderately Integrated (04/01/2023)    Social Connection and Isolation Panel [NHANES]    Frequency of Communication with Friends and Family: More than three times a week    Frequency of Social Gatherings with Friends and Family: More than three times a week    Attends Religious Services: More than 4 times per year    Active Member of [employer] or Organizations: Yes    Attends Banker Meetings: 1 to 4 times per year    Marital Status: Widowed    Tobacco Counseling Counseling given: Not Answered   Clinical Intake:  Pre-visit preparation completed: Yes  Pain : No/denies pain     BMI - recorded: 21.38 Nutritional Status: BMI of 19-24  Normal Nutritional Risks: None Diabetes: No  How often do you need to have someone help you when you read instructions, pamphlets, or other written materials from your doctor or pharmacy?: 1 - Never  Interpreter Needed?: No  Information entered by :: Lanier Ensign, LPN   Activities of Daily Living    04/01/2023    1:41 PM  In your present state of health, do you have any difficulty performing the following activities:  Hearing? 0  Vision? 0  Difficulty concentrating or making decisions? 0  Walking or climbing stairs? 0  Dressing or bathing? 0  Doing errands, shopping? 0  Preparing Food and eating ? N  Using the Toilet? N  In the past six months, have you accidently leaked urine? N  Do you have problems with loss of bowel control? N  Managing your Medications? N  Managing your Finances? N  Housekeeping or managing your Housekeeping? N    Patient Care Team: Willow Ora, MD as PCP - General (Family Medicine) Laurey Morale, MD as PCP - Advanced Heart Failure (Cardiology) Rollene Rotunda, MD as PCP - Cardiology (Cardiology) Sheral Apley, MD as Attending Physician (Orthopedic Surgery) Terrial Rhodes, MD as Consulting Physician (Nephrology) Jiles Crocker, MD (Hematology) Rollene Rotunda, MD as Consulting Physician (Cardiology) [facility], Lenn Sink as Consulting Physician Erroll Luna, [facility] (Inactive) (Pharmacist) Jaci Standard, MD as Consulting Physician (Hematology and Oncology)  Indicate any recent Medical Services you may have received from other than [facility] providers in the past year (date may be approximate).  Assessment:   This is a routine wellness examination for Luis Chen.  Hearing/Vision screen Hearing Screening - Comments:: Pt denies any hearing issues  V[facility] Screening - Comments:: [facility]    Goals Addressed             This Visit's Progress    Patient Stated       Maintain health and activity        Depression Screen    04/01/2023    1:46 PM 06/24/2022    8:54 AM 03/24/2022    8:22 AM 12/23/2021    9:23 AM 06/23/2021    2:46 PM 05/28/2020   10:06 AM 01/16/2019    8:21 AM  PHQ 2/9 Scores  PHQ - 2 Score 0 0 0 0 0 0 0    Fall Risk    04/01/2023    1:48 PM 06/24/2022    8:51 AM 03/24/2022    8:26 AM 12/23/2021    9:23 AM 05/28/2020   10:06 AM  Fall Risk   Falls in the past year? 0 0 0 0 0  Number falls in past yr: 0 0 0 0 0  Injury with Fall? 0 0 0 0 0  Risk for fall due to : No Fall Risks No Fall Risks Impaired vision No Fall Risks   Follow up Falls prevention discussed Falls evaluation completed Falls prevention discussed Falls evaluation completed     MEDICARE RISK AT HOME: Medicare Risk at Home Any stairs in or around the home?: Yes If so, are there any without handrails?: No Home free of loose throw rugs in walkways, pet beds, electrical cords, etc?: Yes Adequate lighting in your home to reduce risk of falls?: Yes Life alert?: No Use of a cane, walker or w/c?: No Grab bars in the bathroom?: No Shower chair or bench in shower?: No Elevated toilet seat or a handicapped toilet?: No  TIMED UP AND GO:  Was the test performed?  No    Cognitive Function:    08/25/2017    8:34 AM  MMSE - Mini Mental State Exam   Orientation to time 5  Orientation to Place 5  Registration 3  Attention/ Calculation 3  Recall 3  Language- name 2 objects 2  Language- repeat 1  Language- follow 3 step command 3  Language- read & follow direction 1  Write a sentence 1  Copy design 1  Total score 28        04/01/2023    1:50 PM 03/24/2022    8:27 AM 01/16/2019    8:23 AM  6CIT Screen  What Year? 0 points 0 points 0 points  What month? 0 points 0 points 0 points  What time? 0 points 0 points 0 points  Count back from 20 0 points 0 points 0 points  Months in reverse 0 points 0 points 0 points  Repeat phrase 0 points 4 points 0 points  Total Score 0 points 4 points 0 points    Immunizations Immunization History  Administered Date(s) Administered   Fluad Quad(high Dose 65+) 10/31/2018, 11/28/2020, 12/23/2021   Influenza Split 11/03/2010   Influenza, High Dose Seasonal PF 11/30/2011, 12/06/2013, 12/10/2014, 11/04/2015, 11/08/2015, 12/20/2015, 12/16/2016, 02/10/2018, 12/09/2022   Influenza,inj,Quad PF,6+ Mos 12/05/2012   Influenza-Unspecified 01/31/2017, 11/13/2018, 12/07/2020, 12/04/2021   Moderna Covid-19 Fall Seasonal Vaccine 44yrs & older 12/27/2021, 12/09/2022   Moderna Covid-19 Vaccine Bivalent Booster 37yrs & up 03/06/2021   Moderna Sars-Covid-2 Vaccination 04/20/2019, 05/18/2019,  01/25/2020, 07/22/2020   Pneumococcal Conjugate-13 12/06/2013   Pneumococcal Polysaccharide-23 03/09/2008   Rsv, Bivalent, Protein Subunit Rsvpref,pf Verdis Frederickson) 03/24/2023   Tdap 12/03/2008, 12/04/2014   Zoster Recombinant(Shingrix) 06/10/2021, 08/11/2021   Zoster, Live 03/09/2008    TDAP status: Up to date  Flu Vaccine status: Up to date  Pneumococcal vaccine status: Up to date  Covid-19 vaccine status: Information provided on how to obtain vaccines.   Qualifies for Shingles Vaccine? Yes   Zostavax completed Yes   Shingrix Completed?: Yes  Screening Tests Health Maintenance  Topic Date Due   COVID-19 Vaccine  (8 - 2024-25 season) 02/03/2023   Medicare Annual Wellness (AWV)  03/31/2024   DTaP/Tdap/Td (3 - Td or Tdap) 12/03/2024   Pneumonia Vaccine 84+ Years old  Completed   INFLUENZA VACCINE  Completed   Zoster Vaccines- Shingrix  Completed   HPV VACCINES  Aged Out    Health Maintenance  Health Maintenance Due  Topic Date Due   COVID-19 Vaccine (8 - 2024-25 season) 02/03/2023    Colorectal cancer screening: No longer required.    Additional Screening:   Vision Screening: Recommended annual ophthalmology exams for early detection of glaucoma and other disorders of the eye. Is the patient up to date with their annual eye exam?  Yes  Who is the provider or what is the name of the office in which the patient attends annual eye exams? [facility] If pt is not established with a provider, would they like to be referred to a provider to establish care? No .   Dental Screening: Recommended annual dental exams for proper oral hygiene   Community Resource Referral / Chronic Care Management: CRR required this visit?  No   CCM required this visit?  No     Plan:     I have personally reviewed and noted the following in the patient's chart:   Medical and social history Use of alcohol, tobacco or illicit drugs  Current medications and supplements including opioid prescriptions. Patient is not currently taking opioid prescriptions. Functional ability and status Nutritional status Physical activity Advanced directives List of other physicians Hospitalizations, surgeries, and ER visits in previous 12 months Vitals Screenings to include cognitive, depression, and falls Referrals and appointments  In addition, I have reviewed and discussed with patient certain preventive protocols, quality metrics, and best practice recommendations. A written personalized care plan for preventive services as well as general preventive health recommendations were provided to patient.     Marzella Schlein,  LPN   2/44/0102   After Visit Summary: (MyChart) Due to this being a telephonic visit, the after visit summary with patients personalized plan was offered to patient via MyChart   Nurse Notes: none

## 2023-04-01 NOTE — Patient Instructions (Signed)
Luis Chen , Thank you for taking time to come for your Medicare Wellness Visit. I appreciate your ongoing commitment to your health goals. Please review the following plan we discussed and let me know if I can assist you in the future.   Referrals/Orders/Follow-Ups/Clinician Recommendations: maintain health and activity   This is a list of the screening recommended for you and due dates:  Health Maintenance  Topic Date Due   COVID-19 Vaccine (8 - 2024-25 season) 02/03/2023   Medicare Annual Wellness Visit  03/25/2023   DTaP/Tdap/Td vaccine (3 - Td or Tdap) 12/03/2024   Pneumonia Vaccine  Completed   Flu Shot  Completed   Zoster (Shingles) Vaccine  Completed   HPV Vaccine  Aged Out    Advanced directives: (Copy Requested) Please bring a copy of your health care power of attorney and living will to the office to be added to your chart at your convenience.  Next Medicare Annual Wellness Visit scheduled for next year: Yes

## 2023-05-07 DIAGNOSIS — D631 Anemia in chronic kidney disease: Secondary | ICD-10-CM | POA: Diagnosis not present

## 2023-05-07 DIAGNOSIS — N2581 Secondary hyperparathyroidism of renal origin: Secondary | ICD-10-CM | POA: Diagnosis not present

## 2023-05-07 DIAGNOSIS — N1831 Chronic kidney disease, stage 3a: Secondary | ICD-10-CM | POA: Diagnosis not present

## 2023-05-07 DIAGNOSIS — D472 Monoclonal gammopathy: Secondary | ICD-10-CM | POA: Diagnosis not present

## 2023-05-07 DIAGNOSIS — I429 Cardiomyopathy, unspecified: Secondary | ICD-10-CM | POA: Diagnosis not present

## 2023-05-07 DIAGNOSIS — I129 Hypertensive chronic kidney disease with stage 1 through stage 4 chronic kidney disease, or unspecified chronic kidney disease: Secondary | ICD-10-CM | POA: Diagnosis not present

## 2023-05-07 DIAGNOSIS — N189 Chronic kidney disease, unspecified: Secondary | ICD-10-CM | POA: Diagnosis not present

## 2023-05-10 LAB — LAB REPORT - SCANNED
Creatinine, POC: 97.9 mg/dL
EGFR: 47

## 2023-05-13 ENCOUNTER — Encounter: Payer: Self-pay | Admitting: Nephrology

## 2023-06-15 ENCOUNTER — Inpatient Hospital Stay: Payer: Medicare Other | Attending: Hematology and Oncology

## 2023-06-15 ENCOUNTER — Encounter (HOSPITAL_COMMUNITY): Payer: Self-pay | Admitting: *Deleted

## 2023-06-15 ENCOUNTER — Other Ambulatory Visit: Payer: Self-pay | Admitting: Hematology and Oncology

## 2023-06-15 DIAGNOSIS — I130 Hypertensive heart and chronic kidney disease with heart failure and stage 1 through stage 4 chronic kidney disease, or unspecified chronic kidney disease: Secondary | ICD-10-CM | POA: Insufficient documentation

## 2023-06-15 DIAGNOSIS — D472 Monoclonal gammopathy: Secondary | ICD-10-CM

## 2023-06-15 DIAGNOSIS — D631 Anemia in chronic kidney disease: Secondary | ICD-10-CM | POA: Diagnosis not present

## 2023-06-15 DIAGNOSIS — N189 Chronic kidney disease, unspecified: Secondary | ICD-10-CM | POA: Insufficient documentation

## 2023-06-15 DIAGNOSIS — Z79899 Other long term (current) drug therapy: Secondary | ICD-10-CM | POA: Diagnosis not present

## 2023-06-15 LAB — CMP (CANCER CENTER ONLY)
ALT: 13 U/L (ref 0–44)
AST: 21 U/L (ref 15–41)
Albumin: 3.6 g/dL (ref 3.5–5.0)
Alkaline Phosphatase: 69 U/L (ref 38–126)
Anion gap: 6 (ref 5–15)
BUN: 17 mg/dL (ref 8–23)
CO2: 23 mmol/L (ref 22–32)
Calcium: 9.1 mg/dL (ref 8.9–10.3)
Chloride: 110 mmol/L (ref 98–111)
Creatinine: 1.31 mg/dL — ABNORMAL HIGH (ref 0.61–1.24)
GFR, Estimated: 54 mL/min — ABNORMAL LOW (ref >60)
Glucose, Bld: 98 mg/dL (ref 70–99)
Potassium: 4.4 mmol/L (ref 3.5–5.1)
Sodium: 139 mmol/L (ref 135–145)
Total Bilirubin: 0.6 mg/dL (ref 0.0–1.2)
Total Protein: 6.8 g/dL (ref 6.5–8.1)

## 2023-06-15 LAB — CBC WITH DIFFERENTIAL (CANCER CENTER ONLY)
Abs Immature Granulocytes: 0.01 10*3/uL (ref 0.00–0.07)
Basophils Absolute: 0 10*3/uL (ref 0.0–0.1)
Basophils Relative: 0 %
Eosinophils Absolute: 0.4 10*3/uL (ref 0.0–0.5)
Eosinophils Relative: 8 %
HCT: 31.3 % — ABNORMAL LOW (ref 39.0–52.0)
Hemoglobin: 10.6 g/dL — ABNORMAL LOW (ref 13.0–17.0)
Immature Granulocytes: 0 %
Lymphocytes Relative: 40 %
Lymphs Abs: 1.8 10*3/uL (ref 0.7–4.0)
MCH: 31.9 pg (ref 26.0–34.0)
MCHC: 33.9 g/dL (ref 30.0–36.0)
MCV: 94.3 fL (ref 80.0–100.0)
Monocytes Absolute: 0.5 10*3/uL (ref 0.1–1.0)
Monocytes Relative: 11 %
Neutro Abs: 1.9 10*3/uL (ref 1.7–7.7)
Neutrophils Relative %: 41 %
Platelet Count: 183 10*3/uL (ref 150–400)
RBC: 3.32 MIL/uL — ABNORMAL LOW (ref 4.22–5.81)
RDW: 15.1 % (ref 11.5–15.5)
WBC Count: 4.6 10*3/uL (ref 4.0–10.5)
nRBC: 0 % (ref 0.0–0.2)

## 2023-06-15 LAB — LACTATE DEHYDROGENASE: LDH: 133 U/L (ref 98–192)

## 2023-06-16 LAB — KAPPA/LAMBDA LIGHT CHAINS
Kappa free light chain: 67.6 mg/L — ABNORMAL HIGH (ref 3.3–19.4)
Kappa, lambda light chain ratio: 3.05 — ABNORMAL HIGH (ref 0.26–1.65)
Lambda free light chains: 22.2 mg/L (ref 5.7–26.3)

## 2023-06-17 LAB — MULTIPLE MYELOMA PANEL, SERUM
Albumin SerPl Elph-Mcnc: 3.3 g/dL (ref 2.9–4.4)
Albumin/Glob SerPl: 1.2 (ref 0.7–1.7)
Alpha 1: 0.2 g/dL (ref 0.0–0.4)
Alpha2 Glob SerPl Elph-Mcnc: 0.6 g/dL (ref 0.4–1.0)
B-Globulin SerPl Elph-Mcnc: 0.9 g/dL (ref 0.7–1.3)
Gamma Glob SerPl Elph-Mcnc: 1.2 g/dL (ref 0.4–1.8)
Globulin, Total: 2.9 g/dL (ref 2.2–3.9)
IgA: 157 mg/dL (ref 61–437)
IgG (Immunoglobin G), Serum: 1321 mg/dL (ref 603–1613)
IgM (Immunoglobulin M), Srm: 118 mg/dL (ref 15–143)
M Protein SerPl Elph-Mcnc: 0.2 g/dL — ABNORMAL HIGH
Total Protein ELP: 6.2 g/dL (ref 6.0–8.5)

## 2023-06-29 ENCOUNTER — Inpatient Hospital Stay: Payer: Medicare Other | Admitting: Physician Assistant

## 2023-07-01 ENCOUNTER — Telehealth: Payer: Self-pay | Admitting: Physician Assistant

## 2023-07-06 ENCOUNTER — Inpatient Hospital Stay (HOSPITAL_BASED_OUTPATIENT_CLINIC_OR_DEPARTMENT_OTHER): Admitting: Physician Assistant

## 2023-07-06 DIAGNOSIS — D472 Monoclonal gammopathy: Secondary | ICD-10-CM | POA: Diagnosis not present

## 2023-07-06 NOTE — Progress Notes (Signed)
 [facility] [phone]   [phone]  PROGRESS NOTE  I connected with Luis Chen  on 07/06/23 by telephone visit and verified that I am speaking with the correct person using two identifiers.   I discussed the limitations, risks, security and privacy concerns of performing an evaluation and management service by telemedicine and the availability of in-person appointments. I also discussed with the patient that there may be a patient responsible charge related to this service. The patient expressed understanding and agreed to proceed.  Patient's location: Home Provider's location: Office   Patient Care Team: Luevenia Saha, MD as PCP - General (Family Medicine) Darlis Eisenmenger, MD as PCP - Advanced Heart Failure (Cardiology) Eilleen Grates, MD as PCP - Cardiology (Cardiology) Saundra Curl, MD as Attending Physician (Orthopedic Surgery) Charley Constable, MD as Consulting Physician (Nephrology) Mora Apple, MD (Hematology) Eilleen Grates, MD as Consulting Physician (Cardiology) [facility], Nada Auer as Consulting Physician Nolon Baxter, Fredricka Jenny, [facility] (Inactive) (Pharmacist) Ander Bame, MD as Consulting Physician (Hematology and Oncology)   CHIEF COMPLAINTS/PURPOSE OF CONSULTATION:  IgG kappa MGUS  HISTORY OF PRESENTING ILLNESS:  Luis Chen 83 y.o. male returns for a follow up for continued management of MGUS. He was last seen on 12/29/2022. In the interim, he denies any changes to his health.   [name] reports he is doing well without any changes to his health. He reports stable energy levels and continues to bowel twice a week. He has a good appetite without any noticeable weight changes. He denies nausea, vomiting or bowel habit changes. He stays hydrated and drinks water throughout the day. He does have some allergies because of the pollen with runny nose and itchy eyes that is managed with OTC  supportive care. He has left knee pain that was evaluated by ortho and felt to be psuedogout. He was give cortisone injection and underwent fluid aspiration. He continues to apply voltaren gel with some improvement of pain.   He denies any new bone/back pain. He denies easy bruising or signs of bleeding. He denies fevers, chills, sweats, shortness of breath, chest pain or cough. He has no other complaints. Rest of the ROS is below.   MEDICAL HISTORY:  Past Medical History:  Diagnosis Date   Anemia, chronic disease 07/01/2015   Angina decubitus (HCC)    Benign essential hypertension 10/31/2008   Qualifier: Diagnosis of  By: [name], MD, [name]     CAD (coronary artery disease)    non obstructive. Left main normal. LAd proximal long 25% stenosis, termingating as focal 50% prox to mid lesion. First & second diag were small, normal. Circumflex in proximal av groove had luminal irregularities.. Was large mid obtuse marg which was branching, scattered luminal irregul. Infer branch did supply some septal perforators. Was PDA,small & normal.  < 1.68mm. There was prox 95% stenosis   CARDIOMYOPATHY 11/27/2008   CKD (chronic kidney disease)    Hyperlipidemia    transient history, controlled on diet   Hypertension    Mixed hyperlipidemia 10/30/2008   Qualifier: Diagnosis of  By: [name], CNA, [name]     Non-traumatic rhabdomyolysis 07/03/2015   Polyarthropathy 07/01/2015   Primary osteoarthritis of knees, bilateral 12/10/2014   Pyogenic arthritis of left knee joint (HCC)    Swelling of left knee joint 07/01/2015   Systolic HF (heart failure) (HCC) 02/08/2018    SURGICAL HISTORY: Past Surgical History:  Procedure Laterality Date   CORONARY ARTERY  BYPASS GRAFT N/A 02/17/2018   Procedure: CORONARY ARTERY BYPASS GRAFTING (CABG);  Surgeon: Bartley Lightning, MD;  Location: [facility] OR;  Service: Open Heart Surgery;  Laterality: N/A;  Times 3 using endoscopically harvested right saphenous vein.     KNEE ARTHROSCOPY  Left 07/01/2015   Procedure: Irrigation and debridement left knee arthroscopy;  Surgeon: Saundra Curl, MD;  Location: [facility] OR;  Service: Orthopedics;  Laterality: Left;   RIGHT/LEFT HEART CATH AND CORONARY ANGIOGRAPHY N/A 02/08/2018   Procedure: RIGHT/LEFT HEART CATH AND CORONARY ANGIOGRAPHY;  Surgeon: [name], MD;  Location: [facility] INVASIVE CV LAB;  Service: Cardiovascular;  Laterality: N/A;   TEE WITHOUT CARDIOVERSION N/A 02/17/2018   Procedure: TRANSESOPHAGEAL ECHOCARDIOGRAM (TEE);  Surgeon: Bartley Lightning, MD;  Location: [facility] OR;  Service: Open Heart Surgery;  Laterality: N/A;    SOCIAL HISTORY: Social History   Socioeconomic History   Marital status: Widowed    Spouse name: Not on file   Number of children: Not on file   Years of education: Not on file   Highest education level: Not on file  Occupational History   Not on file  Tobacco Use   Smoking status: Never   Smokeless tobacco: Never  Vaping Use   Vaping status: Never Used  Substance and Sexual Activity   Alcohol use: Yes    Comment: one glass of wine or beer occassionaly   Drug use: No   Sexual activity: Yes  Other Topics Concern   Not on file  Social History Narrative   Lives with grandson (63 yo), wife passed on Jul 28, 2000. 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    Enjoys bowling- Is in a bowling league    02/11/2018 @ 1352 Pt discharged home with niece. No s/s of distress noted. Education completed, discharge instructions provided and understanding verbalized. Iv sites removed *2. Occlusive drsg per procedure-cdi. No c/o pain/nor distress.   Social Drivers of Corporate investment banker Strain: Low Risk  (04/01/2023)   Overall Financial Resource Strain (CARDIA)    Difficulty of Paying Living Expenses: Not hard at all  Food Insecurity: No Food Insecurity (04/01/2023)   Hunger Vital Sign    Worried About Running Out of Food in the Last Year: Never true     Ran Out of Food in the Last Year: Never true  Transportation Needs: No Transportation Needs (04/01/2023)   PRAPARE - Administrator, Civil Service (Medical): No    Lack of Transportation (Non-Medical): No  Physical Activity: Sufficiently Active (04/01/2023)   Exercise Vital Sign    Days of Exercise per Week: 5 days    Minutes of Exercise per Session: 30 min  Stress: No Stress Concern Present (04/01/2023)   [employer] of Occupational Health - Occupational Stress Questionnaire    Feeling of Stress : Not at all  Social Connections: Moderately Integrated (04/01/2023)   Social Connection and Isolation Panel [NHANES]    Frequency of Communication with Friends and Family: More than three times a week    Frequency of Social Gatherings with Friends and Family: More than three times a week    Attends Religious Services: More than 4 times per year    Active Member of [employer] or Organizations: Yes    Attends Banker Meetings: 1 to 4 times per year    Marital Status: Widowed  Intimate Partner Violence: Not At Risk (04/01/2023)   Humiliation, Afraid, Rape,  and Kick questionnaire    Fear of Current or Ex-Partner: No    Emotionally Abused: No    Physically Abused: No    Sexually Abused: No    FAMILY HISTORY: Family History  Problem Relation Age of Onset   Diabetes Mother    Arthritis Mother    Heart disease Mother    Hyperlipidemia Mother    Hypertension Mother    Heart attack Mother 46   Heart disease Brother        had in his 67s. Had CABG x2   Hypothyroidism Daughter    Stroke Daughter     ALLERGIES:  is allergic to ace inhibitors and entresto  [sacubitril-valsartan].  MEDICATIONS:  Current Outpatient Medications  Medication Sig Dispense Refill   acetic acid  2 % otic solution      aspirin  81 MG EC tablet Take 1 tablet (81 mg total) by mouth daily.     carvedilol  (COREG ) 6.25 MG tablet Take 0.5 tablets (3.125 mg total) by mouth 2 (two) times daily with a  meal.     cyclobenzaprine  (FLEXERIL ) 10 MG tablet Take 5 mg by mouth 3 (three) times daily. *twice daily and at bedtime for muscle pain/cramping*     diclofenac Sodium (VOLTAREN) 1 % GEL Apply topically See admin instructions. APPLY 2 GRAMS TO AFFECTED AREA FOUR TIMES A DAY AS NEEDED FOR UPPER EXTREMITY JOINT PAIN FOR USE WHEN NOT TAKING THE SAME MEDICATION BY MOUTH OR OTHER NSAID MEDICATION BY MOUTH FOR USE WHEN NOT TAKING THE SAME MEDICATION BY MOUTH OR OTHER NSAID MEDICATION BY MOUTH     ezetimibe  (ZETIA ) 10 MG tablet Take 1 tablet (10 mg total) by mouth daily. 30 tablet 11   Ferrous Gluconate  324 (37.5 Fe) MG TABS TAKE 1 TABLET BY MOUTH TWICE DAILY WITH MEALS 60 tablet 1   furosemide  (LASIX ) 20 MG tablet Take 20 mg by mouth daily.     latanoprost  (XALATAN ) 0.005 % ophthalmic solution Place 1 drop into both eyes daily.     Nutritional Supplements (SUPLENA/CARB STEADY) LIQD Take by mouth.     oxyCODONE -acetaminophen  (PERCOCET/ROXICET) 5-325 MG tablet Take 1 tablet by mouth every 6 (six) hours as needed for severe pain (pain score 7-10). (Patient not taking: Reported on 04/01/2023) 15 tablet 0   rosuvastatin  (CRESTOR ) 40 MG tablet TAKE 1 TABLET BY MOUTH ONCE DAILY AT  6PM 90 tablet 3   sildenafil (VIAGRA) 100 MG tablet Take by mouth.     spironolactone  (ALDACTONE ) 25 MG tablet Take 1 tablet (25 mg total) by mouth daily. 90 tablet 3   traZODone  (DESYREL ) 150 MG tablet Take 150 mg by mouth at bedtime as needed for sleep.      No current facility-administered medications for this visit.    REVIEW OF SYSTEMS:   Constitutional: ( - ) fevers, ( - )  chills , ( - ) night sweats Eyes: ( - ) blurriness of vision, ( - ) double vision, ( - ) watery eyes Ears, nose, mouth, throat, and face: ( - ) mucositis, ( - ) sore throat Respiratory: ( - ) cough, ( - ) dyspnea, ( - ) wheezes Cardiovascular: ( - ) palpitation, ( - ) chest discomfort, ( - ) lower extremity swelling Gastrointestinal:  ( - ) nausea, ( - )  heartburn, ( - ) change in bowel habits Skin: ( - ) abnormal skin rashes Lymphatics: ( - ) new lymphadenopathy, ( - ) easy bruising Neurological: ( - ) numbness, ( - ) tingling, ( - )  new weaknesses Behavioral/Psych: ( - ) mood change, ( - ) new changes  All other systems were reviewed with the patient and are negative.  PHYSICAL EXAMINATION: Not performed due to virtual visit  LABORATORY DATA:  I have reviewed the data as listed    Latest Ref Rng & Units 06/15/2023   11:16 AM 03/09/2023    4:27 PM 12/29/2022   12:53 PM  CBC  WBC 4.0 - 10.5 K/uL 4.6  10.9  4.9   Hemoglobin 13.0 - 17.0 g/dL 40.9  81.1  91.4   Hematocrit 39.0 - 52.0 % 31.3  35.5  33.6   Platelets 150 - 400 K/uL 183  169  183        Latest Ref Rng & Units 06/15/2023   11:16 AM 03/09/2023    4:27 PM 12/29/2022   12:53 PM  CMP  Glucose 70 - 99 mg/dL 98  782  82   BUN 8 - 23 mg/dL 17  14  28    Creatinine 0.61 - 1.24 mg/dL 9.56  2.13  0.86   Sodium 135 - 145 mmol/L 139  138  140   Potassium 3.5 - 5.1 mmol/L 4.4  4.0  4.6   Chloride 98 - 111 mmol/L 110  102  108   CO2 22 - 32 mmol/L 23  26  26    Calcium  8.9 - 10.3 mg/dL 9.1  9.7  9.3   Total Protein 6.5 - 8.1 g/dL 6.8  7.4  7.0   Total Bilirubin 0.0 - 1.2 mg/dL 0.6  1.0  0.5   Alkaline Phos 38 - 126 U/L 69  88  69   AST 15 - 41 U/L 21  61  24   ALT 0 - 44 U/L 13  39  20    ASSESSMENT & PLAN Manolo Seiler Ellzey is a 83 y.o. male who returns for a follow up for IgG Kappa MGUS.   #IgG Kappa MGUS: --Labs from 06/15/2023 showed overall stable anemia with Hgb 10.6. No other cytopenias. Creatinine level is stable at 1.31. Normal calcium  levels.  --SPEP showed M protein stable at 0.2. Kappa light chain 67.6, lambda 22.2, ratio 3.05.  --Most met survey from 01/05/2023 was negative for lytic lesions. Repeat annually, next due around October 2025.  --Most recent 24 hr UPEP from 12/31/2022 showed no evidence of monoclonal protein.  --No signs or symptoms of transformation to  active myeloma.  -- RTC in 6 months in MGUS clinic  #Anemia secondary to CKD --Stable between 10-11. Monitor for now   No orders of the defined types were placed in this encounter.   All questions were answered. The patient knows to call the clinic with any problems, questions or concerns.  I have spent a total of 25 minutes minutes of non-face-to-face time, preparing to see the patient, counseling and educating the patient, ordering tests/procedures,  documenting clinical information in the electronic health record, independently interpreting results and communicating results to the patient, and care coordination.   Wyline Hearing, PA-C Department of Hematology/Oncology [facility] [place] Phone: [phone]

## 2023-12-20 ENCOUNTER — Other Ambulatory Visit: Payer: Self-pay | Admitting: Physician Assistant

## 2023-12-21 ENCOUNTER — Inpatient Hospital Stay: Attending: Physician Assistant

## 2023-12-21 DIAGNOSIS — I130 Hypertensive heart and chronic kidney disease with heart failure and stage 1 through stage 4 chronic kidney disease, or unspecified chronic kidney disease: Secondary | ICD-10-CM | POA: Diagnosis not present

## 2023-12-21 DIAGNOSIS — D472 Monoclonal gammopathy: Secondary | ICD-10-CM | POA: Insufficient documentation

## 2023-12-21 DIAGNOSIS — N189 Chronic kidney disease, unspecified: Secondary | ICD-10-CM | POA: Insufficient documentation

## 2023-12-21 DIAGNOSIS — Z79899 Other long term (current) drug therapy: Secondary | ICD-10-CM | POA: Insufficient documentation

## 2023-12-21 DIAGNOSIS — D631 Anemia in chronic kidney disease: Secondary | ICD-10-CM | POA: Insufficient documentation

## 2023-12-21 DIAGNOSIS — Z7982 Long term (current) use of aspirin: Secondary | ICD-10-CM | POA: Insufficient documentation

## 2023-12-21 LAB — CMP (CANCER CENTER ONLY)
ALT: 17 U/L (ref 0–44)
AST: 22 U/L (ref 15–41)
Albumin: 3.5 g/dL (ref 3.5–5.0)
Alkaline Phosphatase: 84 U/L (ref 38–126)
Anion gap: 5 (ref 5–15)
BUN: 20 mg/dL (ref 8–23)
CO2: 27 mmol/L (ref 22–32)
Calcium: 9.8 mg/dL (ref 8.9–10.3)
Chloride: 107 mmol/L (ref 98–111)
Creatinine: 1.38 mg/dL — ABNORMAL HIGH (ref 0.61–1.24)
GFR, Estimated: 50 mL/min — ABNORMAL LOW (ref >60)
Glucose, Bld: 154 mg/dL — ABNORMAL HIGH (ref 70–99)
Potassium: 4.4 mmol/L (ref 3.5–5.1)
Sodium: 139 mmol/L (ref 135–145)
Total Bilirubin: 0.4 mg/dL (ref 0.0–1.2)
Total Protein: 6.8 g/dL (ref 6.5–8.1)

## 2023-12-21 LAB — CBC WITH DIFFERENTIAL (CANCER CENTER ONLY)
Abs Immature Granulocytes: 0 K/uL (ref 0.00–0.07)
Basophils Absolute: 0 K/uL (ref 0.0–0.1)
Basophils Relative: 1 %
Eosinophils Absolute: 0.3 K/uL (ref 0.0–0.5)
Eosinophils Relative: 5 %
HCT: 33.3 % — ABNORMAL LOW (ref 39.0–52.0)
Hemoglobin: 11.2 g/dL — ABNORMAL LOW (ref 13.0–17.0)
Immature Granulocytes: 0 %
Lymphocytes Relative: 28 %
Lymphs Abs: 1.5 K/uL (ref 0.7–4.0)
MCH: 31.7 pg (ref 26.0–34.0)
MCHC: 33.6 g/dL (ref 30.0–36.0)
MCV: 94.3 fL (ref 80.0–100.0)
Monocytes Absolute: 0.5 K/uL (ref 0.1–1.0)
Monocytes Relative: 9 %
Neutro Abs: 3.2 K/uL (ref 1.7–7.7)
Neutrophils Relative %: 57 %
Platelet Count: 178 K/uL (ref 150–400)
RBC: 3.53 MIL/uL — ABNORMAL LOW (ref 4.22–5.81)
RDW: 14 % (ref 11.5–15.5)
WBC Count: 5.6 K/uL (ref 4.0–10.5)
nRBC: 0 % (ref 0.0–0.2)

## 2023-12-21 LAB — LACTATE DEHYDROGENASE: LDH: 132 U/L (ref 98–192)

## 2023-12-22 LAB — KAPPA/LAMBDA LIGHT CHAINS
Kappa free light chain: 73 mg/L — ABNORMAL HIGH (ref 3.3–19.4)
Kappa, lambda light chain ratio: 2.83 — ABNORMAL HIGH (ref 0.26–1.65)
Lambda free light chains: 25.8 mg/L (ref 5.7–26.3)

## 2023-12-27 LAB — MULTIPLE MYELOMA PANEL, SERUM
Albumin SerPl Elph-Mcnc: 2.9 g/dL (ref 2.9–4.4)
Albumin/Glob SerPl: 0.9 (ref 0.7–1.7)
Alpha 1: 0.3 g/dL (ref 0.0–0.4)
Alpha2 Glob SerPl Elph-Mcnc: 0.8 g/dL (ref 0.4–1.0)
B-Globulin SerPl Elph-Mcnc: 1 g/dL (ref 0.7–1.3)
Gamma Glob SerPl Elph-Mcnc: 1.2 g/dL (ref 0.4–1.8)
Globulin, Total: 3.3 g/dL (ref 2.2–3.9)
IgA: 154 mg/dL (ref 61–437)
IgG (Immunoglobin G), Serum: 1335 mg/dL (ref 603–1613)
IgM (Immunoglobulin M), Srm: 133 mg/dL (ref 15–143)
M Protein SerPl Elph-Mcnc: 0.3 g/dL — ABNORMAL HIGH
Total Protein ELP: 6.2 g/dL (ref 6.0–8.5)

## 2023-12-29 ENCOUNTER — Inpatient Hospital Stay: Admitting: Physician Assistant

## 2023-12-29 DIAGNOSIS — N2581 Secondary hyperparathyroidism of renal origin: Secondary | ICD-10-CM | POA: Diagnosis not present

## 2023-12-29 DIAGNOSIS — N1831 Chronic kidney disease, stage 3a: Secondary | ICD-10-CM | POA: Diagnosis not present

## 2023-12-29 DIAGNOSIS — D472 Monoclonal gammopathy: Secondary | ICD-10-CM | POA: Diagnosis not present

## 2023-12-29 DIAGNOSIS — D631 Anemia in chronic kidney disease: Secondary | ICD-10-CM | POA: Diagnosis not present

## 2023-12-29 DIAGNOSIS — I429 Cardiomyopathy, unspecified: Secondary | ICD-10-CM | POA: Diagnosis not present

## 2023-12-29 DIAGNOSIS — I129 Hypertensive chronic kidney disease with stage 1 through stage 4 chronic kidney disease, or unspecified chronic kidney disease: Secondary | ICD-10-CM | POA: Diagnosis not present

## 2023-12-30 DIAGNOSIS — I130 Hypertensive heart and chronic kidney disease with heart failure and stage 1 through stage 4 chronic kidney disease, or unspecified chronic kidney disease: Secondary | ICD-10-CM | POA: Diagnosis not present

## 2023-12-30 DIAGNOSIS — N189 Chronic kidney disease, unspecified: Secondary | ICD-10-CM | POA: Diagnosis not present

## 2023-12-30 DIAGNOSIS — Z7982 Long term (current) use of aspirin: Secondary | ICD-10-CM | POA: Diagnosis not present

## 2023-12-30 DIAGNOSIS — D472 Monoclonal gammopathy: Secondary | ICD-10-CM | POA: Diagnosis not present

## 2023-12-30 DIAGNOSIS — D631 Anemia in chronic kidney disease: Secondary | ICD-10-CM | POA: Diagnosis not present

## 2023-12-30 DIAGNOSIS — Z79899 Other long term (current) drug therapy: Secondary | ICD-10-CM | POA: Diagnosis not present

## 2023-12-30 NOTE — Progress Notes (Unsigned)
 [facility] [phone]   [phone]  PROGRESS NOTE  I connected with Jance E Dettore  on 12/30/23 by telephone visit and verified that I am speaking with the correct person using two identifiers.   I discussed the limitations, risks, security and privacy concerns of performing an evaluation and management service by telemedicine and the availability of in-person appointments. I also discussed with the patient that there may be a patient responsible charge related to this service. The patient expressed understanding and agreed to proceed.  Patient's location: Home Provider's location: Office   Patient Care Team: [name], MD as PCP - General (Family Medicine) Rolan Ezra RAMAN, MD as PCP - Advanced Heart Failure (Cardiology) Lavona Agent, MD as PCP - Cardiology (Cardiology) Beverley Evalene BIRCH, MD as Attending Physician (Orthopedic Surgery) Rayburn Pac, MD as Consulting Physician (Nephrology) Marci Dorisann PARAS, MD (Hematology) Lavona Agent, MD as Consulting Physician (Cardiology) [facility] Bonni Lien as Consulting Physician Nicholaus, Sherlean CROME, [facility] (Inactive) (Pharmacist) Federico Norleen ONEIDA MADISON, MD as Consulting Physician (Hematology and Oncology)   CHIEF COMPLAINTS/PURPOSE OF CONSULTATION:  IgG kappa MGUS  HISTORY OF PRESENTING ILLNESS:  Gaje E Chapple 83 y.o. male returns for a follow up for continued management of MGUS. He was last seen on 07/06/2023. In the interim, he denies any changes to his health.   Mr. Gillingham reports ***   He denies any new bone/back pain. He denies easy bruising or signs of bleeding. He denies fevers, chills, sweats, shortness of breath, chest pain or cough. He has no other complaints. Rest of the ROS is below.   MEDICAL HISTORY:  Past Medical History:  Diagnosis Date   Anemia, chronic disease 07/01/2015   Angina decubitus    Benign essential hypertension 10/31/2008   Qualifier:  Diagnosis of  By: Lavona, MD, CODY Agent     CAD (coronary artery disease)    non obstructive. Left main normal. LAd proximal long 25% stenosis, termingating as focal 50% prox to mid lesion. First & second diag were small, normal. Circumflex in proximal av groove had luminal irregularities.. Was large mid obtuse marg which was branching, scattered luminal irregul. Infer branch did supply some septal perforators. Was PDA,small & normal.  < 1.55mm. There was prox 95% stenosis   CARDIOMYOPATHY 11/27/2008   CKD (chronic kidney disease)    Hyperlipidemia    transient history, controlled on diet   Hypertension    Mixed hyperlipidemia 10/30/2008   Qualifier: Diagnosis of  By: Leana, CNA, [name]     Non-traumatic rhabdomyolysis 07/03/2015   Polyarthropathy 07/01/2015   Primary osteoarthritis of knees, bilateral 12/10/2014   Pyogenic arthritis of left knee joint (HCC)    Swelling of left knee joint 07/01/2015   Systolic HF (heart failure) (HCC) 02/08/2018    SURGICAL HISTORY: Past Surgical History:  Procedure Laterality Date   CORONARY ARTERY BYPASS GRAFT N/A 02/17/2018   Procedure: CORONARY ARTERY BYPASS GRAFTING (CABG);  Surgeon: Lucas Dorise POUR, MD;  Location: [facility] OR;  Service: Open Heart Surgery;  Laterality: N/A;  Times 3 using endoscopically harvested right saphenous vein.     KNEE ARTHROSCOPY Left 07/01/2015   Procedure: Irrigation and debridement left knee arthroscopy;  Surgeon: Evalene BIRCH Beverley, MD;  Location: [facility] OR;  Service: Orthopedics;  Laterality: Left;   RIGHT/LEFT HEART CATH AND CORONARY ANGIOGRAPHY N/A 02/08/2018   Procedure: RIGHT/LEFT HEART CATH AND CORONARY ANGIOGRAPHY;  Surgeon: Dann Candyce RAMAN, MD;  Location: [facility] INVASIVE CV LAB;  Service: Cardiovascular;  Laterality: N/A;   TEE WITHOUT CARDIOVERSION N/A 02/17/2018   Procedure: TRANSESOPHAGEAL ECHOCARDIOGRAM (TEE);  Surgeon: Lucas Dorise POUR, MD;  Location: [facility] OR;  Service: Open Heart Surgery;  Laterality: N/A;    SOCIAL  HISTORY: Social History   Socioeconomic History   Marital status: Widowed    Spouse name: Not on file   Number of children: Not on file   Years of education: Not on file   Highest education level: Not on file  Occupational History   Not on file  Tobacco Use   Smoking status: Never   Smokeless tobacco: Never  Vaping Use   Vaping status: Never Used  Substance and Sexual Activity   Alcohol use: Yes    Comment: one glass of wine or beer occassionaly   Drug use: No   Sexual activity: Yes  Other Topics Concern   Not on file  Social History Narrative   Lives with grandson (52 yo), wife passed on 20-Jan-2001. 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    Enjoys bowling- Is in a bowling league    02/11/2018 @ 1352 Pt discharged home with niece. No s/s of distress noted. Education completed, discharge instructions provided and understanding verbalized. Iv sites removed *2. Occlusive drsg per procedure-cdi. No c/o pain/nor distress.   Social Drivers of Corporate investment banker Strain: Low Risk  (04/01/2023)   Overall Financial Resource Strain (CARDIA)    Difficulty of Paying Living Expenses: Not hard at all  Food Insecurity: No Food Insecurity (04/01/2023)   Hunger Vital Sign    Worried About Running Out of Food in the Last Year: Never true    Ran Out of Food in the Last Year: Never true  Transportation Needs: No Transportation Needs (04/01/2023)   PRAPARE - Administrator, Civil Service (Medical): No    Lack of Transportation (Non-Medical): No  Physical Activity: Sufficiently Active (04/01/2023)   Exercise Vital Sign    Days of Exercise per Week: 5 days    Minutes of Exercise per Session: 30 min  Stress: No Stress Concern Present (04/01/2023)   [employer] of Occupational Health - Occupational Stress Questionnaire    Feeling of Stress : Not at all  Social Connections: Moderately Integrated (04/01/2023)   Social  Connection and Isolation Panel    Frequency of Communication with Friends and Family: More than three times a week    Frequency of Social Gatherings with Friends and Family: More than three times a week    Attends Religious Services: More than 4 times per year    Active Member of [employer] or Organizations: Yes    Attends Banker Meetings: 1 to 4 times per year    Marital Status: Widowed  Intimate Partner Violence: Not At Risk (04/01/2023)   Humiliation, Afraid, Rape, and Kick questionnaire    Fear of Current or Ex-Partner: No    Emotionally Abused: No    Physically Abused: No    Sexually Abused: No    FAMILY HISTORY: Family History  Problem Relation Age of Onset   Diabetes Mother    Arthritis Mother    Heart disease Mother    Hyperlipidemia Mother    Hypertension Mother    Heart attack Mother 59   Heart disease Brother        had in his 36s. Had CABG x2   Hypothyroidism Daughter    Stroke Daughter     ALLERGIES:  is allergic to ace inhibitors and entresto  [sacubitril-valsartan].  MEDICATIONS:  Current Outpatient Medications  Medication Sig Dispense Refill   acetic acid  2 % otic solution      aspirin  81 MG EC tablet Take 1 tablet (81 mg total) by mouth daily.     carvedilol  (COREG ) 6.25 MG tablet Take 0.5 tablets (3.125 mg total) by mouth 2 (two) times daily with a meal.     cyclobenzaprine  (FLEXERIL ) 10 MG tablet Take 5 mg by mouth 3 (three) times daily. *twice daily and at bedtime for muscle pain/cramping*     diclofenac Sodium (VOLTAREN) 1 % GEL Apply topically See admin instructions. APPLY 2 GRAMS TO AFFECTED AREA FOUR TIMES A DAY AS NEEDED FOR UPPER EXTREMITY JOINT PAIN FOR USE WHEN NOT TAKING THE SAME MEDICATION BY MOUTH OR OTHER NSAID MEDICATION BY MOUTH FOR USE WHEN NOT TAKING THE SAME MEDICATION BY MOUTH OR OTHER NSAID MEDICATION BY MOUTH     ezetimibe  (ZETIA ) 10 MG tablet Take 1 tablet (10 mg total) by mouth daily. 30 tablet 11   Ferrous Gluconate  324 (37.5  Fe) MG TABS TAKE 1 TABLET BY MOUTH TWICE DAILY WITH MEALS 60 tablet 1   furosemide  (LASIX ) 20 MG tablet Take 20 mg by mouth daily.     latanoprost  (XALATAN ) 0.005 % ophthalmic solution Place 1 drop into both eyes daily.     Nutritional Supplements (SUPLENA/CARB STEADY) LIQD Take by mouth.     oxyCODONE -acetaminophen  (PERCOCET/ROXICET) 5-325 MG tablet Take 1 tablet by mouth every 6 (six) hours as needed for severe pain (pain score 7-10). (Patient not taking: Reported on 04/01/2023) 15 tablet 0   rosuvastatin  (CRESTOR ) 40 MG tablet TAKE 1 TABLET BY MOUTH ONCE DAILY AT  6PM 90 tablet 3   sildenafil (VIAGRA) 100 MG tablet Take by mouth.     spironolactone  (ALDACTONE ) 25 MG tablet Take 1 tablet (25 mg total) by mouth daily. 90 tablet 3   traZODone  (DESYREL ) 150 MG tablet Take 150 mg by mouth at bedtime as needed for sleep.      No current facility-administered medications for this visit.    REVIEW OF SYSTEMS:   Constitutional: ( - ) fevers, ( - )  chills , ( - ) night sweats Eyes: ( - ) blurriness of vision, ( - ) double vision, ( - ) watery eyes Ears, nose, mouth, throat, and face: ( - ) mucositis, ( - ) sore throat Respiratory: ( - ) cough, ( - ) dyspnea, ( - ) wheezes Cardiovascular: ( - ) palpitation, ( - ) chest discomfort, ( - ) lower extremity swelling Gastrointestinal:  ( - ) nausea, ( - ) heartburn, ( - ) change in bowel habits Skin: ( - ) abnormal skin rashes Lymphatics: ( - ) new lymphadenopathy, ( - ) easy bruising Neurological: ( - ) numbness, ( - ) tingling, ( - ) new weaknesses Behavioral/Psych: ( - ) mood change, ( - ) new changes  All other systems were reviewed with the patient and are negative.  PHYSICAL EXAMINATION: ECOG PERFORMANCE STATUS: 0 - Asymptomatic   There were no vitals filed for this visit.  GENERAL: well appearing male in NAD  SKIN: skin color, texture, turgor are normal, no rashes or significant lesions EYES: conjunctiva are pink and non-injected, sclera  clear LUNGS: clear to auscultation and percussion with normal breathing effort HEART: regular rate & rhythm and no murmurs and no lower extremity edema Musculoskeletal: no cyanosis of digits and no clubbing  PSYCH: alert &  oriented x 3, fluent speech NEURO: no focal motor/sensory deficits  LABORATORY DATA:  I have reviewed the data as listed    Latest Ref Rng & Units 12/21/2023    1:07 PM 06/15/2023   11:16 AM 03/09/2023    4:27 PM  CBC  WBC 4.0 - 10.5 K/uL 5.6  4.6  10.9   Hemoglobin 13.0 - 17.0 g/dL 88.7  89.3  87.9   Hematocrit 39.0 - 52.0 % 33.3  31.3  35.5   Platelets 150 - 400 K/uL 178  183  169        Latest Ref Rng & Units 12/21/2023    1:07 PM 06/15/2023   11:16 AM 03/09/2023    4:27 PM  CMP  Glucose 70 - 99 mg/dL 845  98  857   BUN 8 - 23 mg/dL 20  17  14    Creatinine 0.61 - 1.24 mg/dL 8.61  8.68  8.70   Sodium 135 - 145 mmol/L 139  139  138   Potassium 3.5 - 5.1 mmol/L 4.4  4.4  4.0   Chloride 98 - 111 mmol/L 107  110  102   CO2 22 - 32 mmol/L 27  23  26    Calcium  8.9 - 10.3 mg/dL 9.8  9.1  9.7   Total Protein 6.5 - 8.1 g/dL 6.8  6.8  7.4   Total Bilirubin 0.0 - 1.2 mg/dL 0.4  0.6  1.0   Alkaline Phos 38 - 126 U/L 84  69  88   AST 15 - 41 U/L 22  21  61   ALT 0 - 44 U/L 17  13  39    ASSESSMENT & PLAN Marcy Bogosian Tripoli is a 83 y.o. male who returns for a follow up for IgG Kappa MGUS.   #IgG Kappa MGUS: --Labs from 12/21/2023 showed overall stable anemia with Hgb 11.2. No other cytopenias. Creatinine level is stable at 1.38. Normal calcium  levels.  --SPEP showed M protein stable at 0.3. Kappa light chain 73.0, lambda 25.8, ratio 2.83.  --Most met survey from 01/05/2023 was negative for lytic lesions. Repeat annually, next one scheduled for 01/05/2024.  --Most recent 24 hr UPEP from 12/31/2022 showed no evidence of monoclonal protein.  --No signs or symptoms of transformation to active myeloma.  -- RTC in 6 months in MGUS clinic  #Anemia secondary to  CKD --Stable between 10-11. Monitor for now   No orders of the defined types were placed in this encounter.   All questions were answered. The patient knows to call the clinic with any problems, questions or concerns.  I have spent a total of 25 minutes minutes of non-face-to-face time, preparing to see the patient, counseling and educating the patient, ordering tests/procedures,  documenting clinical information in the electronic health record, independently interpreting results and communicating results to the patient, and care coordination.   Johnston Police, PA-C Department of Hematology/Oncology [facility] Phone: [phone]

## 2023-12-31 ENCOUNTER — Other Ambulatory Visit: Payer: Self-pay

## 2023-12-31 ENCOUNTER — Inpatient Hospital Stay (HOSPITAL_BASED_OUTPATIENT_CLINIC_OR_DEPARTMENT_OTHER): Admitting: Physician Assistant

## 2023-12-31 VITALS — BP 112/78 | HR 75 | Temp 98.2°F | Resp 18 | Wt 143.7 lb

## 2023-12-31 DIAGNOSIS — Z7982 Long term (current) use of aspirin: Secondary | ICD-10-CM | POA: Diagnosis not present

## 2023-12-31 DIAGNOSIS — I130 Hypertensive heart and chronic kidney disease with heart failure and stage 1 through stage 4 chronic kidney disease, or unspecified chronic kidney disease: Secondary | ICD-10-CM | POA: Diagnosis not present

## 2023-12-31 DIAGNOSIS — D472 Monoclonal gammopathy: Secondary | ICD-10-CM

## 2023-12-31 DIAGNOSIS — D631 Anemia in chronic kidney disease: Secondary | ICD-10-CM | POA: Diagnosis not present

## 2023-12-31 DIAGNOSIS — N189 Chronic kidney disease, unspecified: Secondary | ICD-10-CM | POA: Diagnosis not present

## 2023-12-31 DIAGNOSIS — Z79899 Other long term (current) drug therapy: Secondary | ICD-10-CM | POA: Diagnosis not present

## 2024-01-05 ENCOUNTER — Ambulatory Visit (HOSPITAL_COMMUNITY)
Admission: RE | Admit: 2024-01-05 | Discharge: 2024-01-05 | Disposition: A | Discharge status: Home / Self Care | Source: Ambulatory Visit | Attending: Physician Assistant | Admitting: Physician Assistant

## 2024-01-05 DIAGNOSIS — D472 Monoclonal gammopathy: Secondary | ICD-10-CM | POA: Diagnosis not present

## 2024-01-05 DIAGNOSIS — M170 Bilateral primary osteoarthritis of knee: Secondary | ICD-10-CM | POA: Diagnosis not present

## 2024-01-05 LAB — UPEP/UIFE/LIGHT CHAINS/TP, 24-HR UR
% BETA, Urine: 26.5 %
ALPHA 1 URINE: 5.8 %
Albumin, U: 19.1 %
Alpha 2, Urine: 23.6 %
Free Kappa Lt Chains,Ur: 57.58 mg/L (ref 1.17–86.46)
Free Kappa/Lambda Ratio: 12.52 (ref 1.83–14.26)
Free Lambda Lt Chains,Ur: 4.6 mg/L (ref 0.27–15.21)
GAMMA GLOBULIN URINE: 25 %
Total Protein, Urine-Ur/day: 74 mg/(24.h) (ref 30–150)
Total Protein, Urine: 9.2 mg/dL
Total Volume: 800

## 2024-01-06 ENCOUNTER — Ambulatory Visit: Payer: Self-pay | Admitting: Physician Assistant

## 2024-01-10 ENCOUNTER — Encounter: Payer: Self-pay | Admitting: Radiology

## 2024-03-14 ENCOUNTER — Encounter (HOSPITAL_BASED_OUTPATIENT_CLINIC_OR_DEPARTMENT_OTHER): Payer: Self-pay | Admitting: *Deleted

## 2024-03-14 ENCOUNTER — Emergency Department (HOSPITAL_BASED_OUTPATIENT_CLINIC_OR_DEPARTMENT_OTHER)

## 2024-03-14 ENCOUNTER — Inpatient Hospital Stay (HOSPITAL_BASED_OUTPATIENT_CLINIC_OR_DEPARTMENT_OTHER)
Admission: EM | Admit: 2024-03-14 | Discharge: 2024-03-30 | DRG: 405 | Disposition: A | Discharge status: Home Health | Attending: Internal Medicine | Admitting: Internal Medicine

## 2024-03-14 ENCOUNTER — Other Ambulatory Visit: Payer: Self-pay

## 2024-03-14 DIAGNOSIS — K4090 Unilateral inguinal hernia, without obstruction or gangrene, not specified as recurrent: Secondary | ICD-10-CM | POA: Diagnosis present

## 2024-03-14 DIAGNOSIS — N179 Acute kidney failure, unspecified: Secondary | ICD-10-CM | POA: Diagnosis present

## 2024-03-14 DIAGNOSIS — R739 Hyperglycemia, unspecified: Secondary | ICD-10-CM | POA: Diagnosis present

## 2024-03-14 DIAGNOSIS — M130 Polyarthritis, unspecified: Secondary | ICD-10-CM | POA: Diagnosis present

## 2024-03-14 DIAGNOSIS — D472 Monoclonal gammopathy: Secondary | ICD-10-CM | POA: Diagnosis present

## 2024-03-14 DIAGNOSIS — I700 Atherosclerosis of aorta: Secondary | ICD-10-CM | POA: Diagnosis present

## 2024-03-14 DIAGNOSIS — E869 Volume depletion, unspecified: Secondary | ICD-10-CM | POA: Diagnosis present

## 2024-03-14 DIAGNOSIS — R748 Abnormal levels of other serum enzymes: Secondary | ICD-10-CM

## 2024-03-14 DIAGNOSIS — R7303 Prediabetes: Secondary | ICD-10-CM | POA: Diagnosis present

## 2024-03-14 DIAGNOSIS — Z823 Family history of stroke: Secondary | ICD-10-CM

## 2024-03-14 DIAGNOSIS — I5022 Chronic systolic (congestive) heart failure: Secondary | ICD-10-CM | POA: Diagnosis present

## 2024-03-14 DIAGNOSIS — M11262 Other chondrocalcinosis, left knee: Secondary | ICD-10-CM | POA: Diagnosis present

## 2024-03-14 DIAGNOSIS — I25118 Atherosclerotic heart disease of native coronary artery with other forms of angina pectoris: Secondary | ICD-10-CM | POA: Diagnosis present

## 2024-03-14 DIAGNOSIS — I77810 Thoracic aortic ectasia: Secondary | ICD-10-CM | POA: Diagnosis present

## 2024-03-14 DIAGNOSIS — K315 Obstruction of duodenum: Secondary | ICD-10-CM | POA: Diagnosis present

## 2024-03-14 DIAGNOSIS — Z833 Family history of diabetes mellitus: Secondary | ICD-10-CM

## 2024-03-14 DIAGNOSIS — C250 Malignant neoplasm of head of pancreas: Principal | ICD-10-CM | POA: Diagnosis present

## 2024-03-14 DIAGNOSIS — Z681 Body mass index (BMI) 19 or less, adult: Secondary | ICD-10-CM

## 2024-03-14 DIAGNOSIS — K828 Other specified diseases of gallbladder: Secondary | ICD-10-CM | POA: Diagnosis present

## 2024-03-14 DIAGNOSIS — Z8249 Family history of ischemic heart disease and other diseases of the circulatory system: Secondary | ICD-10-CM

## 2024-03-14 DIAGNOSIS — I071 Rheumatic tricuspid insufficiency: Secondary | ICD-10-CM | POA: Diagnosis present

## 2024-03-14 DIAGNOSIS — Z79899 Other long term (current) drug therapy: Secondary | ICD-10-CM

## 2024-03-14 DIAGNOSIS — Z8261 Family history of arthritis: Secondary | ICD-10-CM

## 2024-03-14 DIAGNOSIS — D638 Anemia in other chronic diseases classified elsewhere: Secondary | ICD-10-CM | POA: Diagnosis present

## 2024-03-14 DIAGNOSIS — K449 Diaphragmatic hernia without obstruction or gangrene: Secondary | ICD-10-CM | POA: Diagnosis present

## 2024-03-14 DIAGNOSIS — F32A Depression, unspecified: Secondary | ICD-10-CM | POA: Diagnosis present

## 2024-03-14 DIAGNOSIS — E782 Mixed hyperlipidemia: Secondary | ICD-10-CM | POA: Diagnosis present

## 2024-03-14 DIAGNOSIS — C259 Malignant neoplasm of pancreas, unspecified: Secondary | ICD-10-CM | POA: Insufficient documentation

## 2024-03-14 DIAGNOSIS — R7401 Elevation of levels of liver transaminase levels: Secondary | ICD-10-CM | POA: Diagnosis present

## 2024-03-14 DIAGNOSIS — I959 Hypotension, unspecified: Secondary | ICD-10-CM | POA: Diagnosis present

## 2024-03-14 DIAGNOSIS — N183 Chronic kidney disease, stage 3 unspecified: Secondary | ICD-10-CM | POA: Diagnosis present

## 2024-03-14 DIAGNOSIS — Z951 Presence of aortocoronary bypass graft: Secondary | ICD-10-CM

## 2024-03-14 DIAGNOSIS — N401 Enlarged prostate with lower urinary tract symptoms: Secondary | ICD-10-CM | POA: Diagnosis present

## 2024-03-14 DIAGNOSIS — H409 Unspecified glaucoma: Secondary | ICD-10-CM | POA: Diagnosis present

## 2024-03-14 DIAGNOSIS — D631 Anemia in chronic kidney disease: Secondary | ICD-10-CM | POA: Diagnosis present

## 2024-03-14 DIAGNOSIS — Z888 Allergy status to other drugs, medicaments and biological substances status: Secondary | ICD-10-CM

## 2024-03-14 DIAGNOSIS — R509 Fever, unspecified: Secondary | ICD-10-CM | POA: Diagnosis not present

## 2024-03-14 DIAGNOSIS — D630 Anemia in neoplastic disease: Secondary | ICD-10-CM | POA: Diagnosis present

## 2024-03-14 DIAGNOSIS — N1832 Chronic kidney disease, stage 3b: Secondary | ICD-10-CM | POA: Diagnosis present

## 2024-03-14 DIAGNOSIS — Z7982 Long term (current) use of aspirin: Secondary | ICD-10-CM

## 2024-03-14 DIAGNOSIS — R935 Abnormal findings on diagnostic imaging of other abdominal regions, including retroperitoneum: Secondary | ICD-10-CM

## 2024-03-14 DIAGNOSIS — R339 Retention of urine, unspecified: Secondary | ICD-10-CM | POA: Diagnosis present

## 2024-03-14 DIAGNOSIS — Z5982 Transportation insecurity: Secondary | ICD-10-CM

## 2024-03-14 DIAGNOSIS — K831 Obstruction of bile duct: Secondary | ICD-10-CM | POA: Diagnosis present

## 2024-03-14 DIAGNOSIS — R17 Unspecified jaundice: Principal | ICD-10-CM

## 2024-03-14 DIAGNOSIS — K8689 Other specified diseases of pancreas: Secondary | ICD-10-CM

## 2024-03-14 DIAGNOSIS — K439 Ventral hernia without obstruction or gangrene: Secondary | ICD-10-CM | POA: Diagnosis present

## 2024-03-14 DIAGNOSIS — E876 Hypokalemia: Secondary | ICD-10-CM | POA: Diagnosis present

## 2024-03-14 DIAGNOSIS — E43 Unspecified severe protein-calorie malnutrition: Secondary | ICD-10-CM | POA: Diagnosis present

## 2024-03-14 DIAGNOSIS — M25461 Effusion, right knee: Secondary | ICD-10-CM

## 2024-03-14 DIAGNOSIS — I130 Hypertensive heart and chronic kidney disease with heart failure and stage 1 through stage 4 chronic kidney disease, or unspecified chronic kidney disease: Secondary | ICD-10-CM | POA: Diagnosis present

## 2024-03-14 DIAGNOSIS — N320 Bladder-neck obstruction: Secondary | ICD-10-CM | POA: Diagnosis present

## 2024-03-14 DIAGNOSIS — E872 Acidosis, unspecified: Secondary | ICD-10-CM | POA: Diagnosis present

## 2024-03-14 DIAGNOSIS — I428 Other cardiomyopathies: Secondary | ICD-10-CM | POA: Diagnosis present

## 2024-03-14 DIAGNOSIS — Z83438 Family history of other disorder of lipoprotein metabolism and other lipidemia: Secondary | ICD-10-CM

## 2024-03-14 DIAGNOSIS — M009 Pyogenic arthritis, unspecified: Secondary | ICD-10-CM | POA: Diagnosis present

## 2024-03-14 DIAGNOSIS — I2510 Atherosclerotic heart disease of native coronary artery without angina pectoris: Secondary | ICD-10-CM | POA: Diagnosis present

## 2024-03-14 DIAGNOSIS — R338 Other retention of urine: Secondary | ICD-10-CM | POA: Diagnosis present

## 2024-03-14 DIAGNOSIS — E039 Hypothyroidism, unspecified: Secondary | ICD-10-CM | POA: Diagnosis present

## 2024-03-14 LAB — CBC WITH DIFFERENTIAL/PLATELET
Abs Immature Granulocytes: 0.01 K/uL (ref 0.00–0.07)
Basophils Absolute: 0 K/uL (ref 0.0–0.1)
Basophils Relative: 0 %
Eosinophils Absolute: 0.3 K/uL (ref 0.0–0.5)
Eosinophils Relative: 5 %
HCT: 26 % — ABNORMAL LOW (ref 39.0–52.0)
Hemoglobin: 9.2 g/dL — ABNORMAL LOW (ref 13.0–17.0)
Immature Granulocytes: 0 %
Lymphocytes Relative: 20 %
Lymphs Abs: 1.2 K/uL (ref 0.7–4.0)
MCH: 31.4 pg (ref 26.0–34.0)
MCHC: 35.4 g/dL (ref 30.0–36.0)
MCV: 88.7 fL (ref 80.0–100.0)
Monocytes Absolute: 0.7 K/uL (ref 0.1–1.0)
Monocytes Relative: 11 %
Neutro Abs: 3.9 K/uL (ref 1.7–7.7)
Neutrophils Relative %: 64 %
Platelets: 258 K/uL (ref 150–400)
RBC: 2.93 MIL/uL — ABNORMAL LOW (ref 4.22–5.81)
RDW: 17.7 % — ABNORMAL HIGH (ref 11.5–15.5)
WBC: 6.1 K/uL (ref 4.0–10.5)
nRBC: 0 % (ref 0.0–0.2)

## 2024-03-14 LAB — COMPREHENSIVE METABOLIC PANEL WITH GFR
ALT: 285 U/L — ABNORMAL HIGH (ref 0–44)
AST: 222 U/L — ABNORMAL HIGH (ref 15–41)
Albumin: 3.3 g/dL — ABNORMAL LOW (ref 3.5–5.0)
Alkaline Phosphatase: 1876 U/L — ABNORMAL HIGH (ref 38–126)
Anion gap: 13 (ref 5–15)
BUN: 27 mg/dL — ABNORMAL HIGH (ref 8–23)
CO2: 21 mmol/L — ABNORMAL LOW (ref 22–32)
Calcium: 9.7 mg/dL (ref 8.9–10.3)
Chloride: 103 mmol/L (ref 98–111)
Creatinine, Ser: 2.57 mg/dL — ABNORMAL HIGH (ref 0.61–1.24)
GFR, Estimated: 24 mL/min — ABNORMAL LOW
Glucose, Bld: 147 mg/dL — ABNORMAL HIGH (ref 70–99)
Potassium: 4.5 mmol/L (ref 3.5–5.1)
Sodium: 137 mmol/L (ref 135–145)
Total Bilirubin: 8.2 mg/dL — ABNORMAL HIGH (ref 0.0–1.2)
Total Protein: 6.7 g/dL (ref 6.5–8.1)

## 2024-03-14 LAB — TROPONIN T, HIGH SENSITIVITY
Troponin T High Sensitivity: 26 ng/L — ABNORMAL HIGH (ref 0–19)
Troponin T High Sensitivity: 25 ng/L — ABNORMAL HIGH (ref 0–19)

## 2024-03-14 LAB — PROTIME-INR
INR: 1 (ref 0.8–1.2)
Prothrombin Time: 13.3 s (ref 11.4–15.2)

## 2024-03-14 LAB — LIPASE, BLOOD: Lipase: 62 U/L — ABNORMAL HIGH (ref 11–51)

## 2024-03-14 LAB — LACTIC ACID, PLASMA: Lactic Acid, Venous: 1.2 mmol/L (ref 0.5–1.9)

## 2024-03-14 MED ORDER — PIPERACILLIN-TAZOBACTAM 3.375 G IVPB 30 MIN
3.3750 g | Freq: Once | INTRAVENOUS | Status: AC
  Administered 2024-03-14: 3.375 g via INTRAVENOUS
  Filled 2024-03-14: qty 50

## 2024-03-14 MED ORDER — PIPERACILLIN-TAZOBACTAM IN DEX 2-0.25 GM/50ML IV SOLN
2.2500 g | Freq: Four times a day (QID) | INTRAVENOUS | Status: DC
  Filled 2024-03-14: qty 50

## 2024-03-14 MED ORDER — LACTATED RINGERS IV SOLN: INTRAVENOUS | Status: DC

## 2024-03-14 MED ORDER — MORPHINE SULFATE (PF) 2 MG/ML IV SOLN: 1.0000 mg | INTRAVENOUS | Status: DC | PRN

## 2024-03-14 MED ORDER — ONDANSETRON HCL 4 MG/2ML IJ SOLN: 4.0000 mg | Freq: Four times a day (QID) | INTRAMUSCULAR | Status: DC | PRN

## 2024-03-14 MED ORDER — SODIUM CHLORIDE 0.9 % IV BOLUS
1000.0000 mL | Freq: Once | INTRAVENOUS | Status: AC
  Administered 2024-03-14: 1000 mL via INTRAVENOUS

## 2024-03-14 MED ORDER — DEXTROSE IN LACTATED RINGERS 5 % IV SOLN: INTRAVENOUS | Status: AC

## 2024-03-14 NOTE — Progress Notes (Signed)
 Pharmacy Antibiotic Note  WARRICK LLERA is a 84 y.o. male for which pharmacy has been consulted for zosyn  dosing for IAI.  Using historical weights, CrCl is ~19.7 --- borderline for dose adjustment   Plan: Zosyn  3.375g once in the ED at [facility] --Zosyn  2.25g q6h after --F/u CrCl once actual weight entered to see if need to adjust dose Monitor WBC, fever, renal function, cultures De-escalate when able     Temp (24hrs), Avg:97.5 F (36.4 C), Min:97.5 F (36.4 C), Max:97.5 F (36.4 C)  Recent Labs  Lab 03/14/24 1730  WBC 6.1  CREATININE 2.57*  LATICACIDVEN 1.2    CrCl cannot be calculated (Unknown ideal weight.).    Allergies[1]  Microbiology results: Pending  Thank you for allowing pharmacy to be a part of this patients care.  Dorn Buttner, PharmD, BCPS 03/14/2024 10:18 PM ED Clinical Pharmacist -  [phone]    Addendum:  Pt still at [facility] which does not have access to 2.25g doses of Zosyn .  Will change to 3.375g IV Q12H (4h infusion) and f/u plan when transferred. Marvetta Dauphin, PharmD, BCPS 03/15/2024 5:21 AM      [1]  Allergies Allergen Reactions   Ace Inhibitors Swelling    Facial swelling    Entresto  [Sacubitril-Valsartan] Swelling    Angioedema with ACE Inhibitor

## 2024-03-14 NOTE — Plan of Care (Addendum)
 [facility] emergency department to  [facility] transfer:  84 year old man history of ischemic cardiomyopathy  improved EF 50-55 from 25%, MGUS, anemia, essential hypertension, CAD, CKD, hyperlipidemia, osteoarthritis, presented to emergency department complaining of dizziness at home and low blood pressure systolic 90 at home when he checked the blood pressure.  Patient reported he is also losing weight. He is followed through the [facility]. He is currently on supplemental shakes for energy/weight gain. He presents today because he was feeling lightheaded and checked his blood pressure which was low. States he is otherwise been compliant with his home medications. He did not have any syncope, chest pain, palpitations. He did have about 4-5 episodes of diarrhea 2 days ago and endorses decreased appetite but denies any fever, vomiting or genitourinary symptoms.    At presentation to ED patient found hypotensive blood pressure 91/53 otherwise hemodynamically stable.  Lab work, CBC normal WBC and platelet count however low hemoglobin 9.2 baseline hemoglobin around 10-12. CMP showing elevated creatinine 2.57, elevated AST 222, ALT 285, ALP 1876 and elevated bilirubin 8.2.  Slightly elevated lipase 62 Normal pro time INR. Normal lactic acid level. Flat troponin 25 and 26.  EKG showed normal sinus rhythm heart rate 71.  CT abdomen pelvis showing: 1. Fullness in the head of the pancreas with associated pancreatic and bile duct dilatation, likely representing a pancreatic adenocarcinoma measuring about 2.5 cm in diameter; unenhanced evaluation is limited, and MRCP or contrast-enhanced CT abdomen and pelvis is recommended for further evaluation.  Dr. Bari consulted with gastroenterologist Dr. Suzann from [facility].  She recommends admission, preferably at [facility] long, n.p.o. at midnight with anticipation for MRCP and further dedicated imaging tomorrow.    In the ED patient received 1 L of NS bolus and  currently on maintenance fluid LR 125 cc/h.   Hospitalist consulted for further emergent management for transaminitis, hyperbilirubinemia and AKI. Update, Dr. Bari reported that GI recommended to start patient on IV Zosyn  and follow-up with blood culture.    [facility] will assume care on arrival to accepting facility. Until arrival, care as per EDP. However, [facility] available 24/7 for questions and assistance. Check [email or web address] for on-call coverage. Nursing staff, please call [facility] Admits & Consults System-Wide number under Amion on patient's arrival so appropriate admitting provider can evaluate the pt.   Author: Shyheem Whitham, MD  [facility]

## 2024-03-14 NOTE — ED Triage Notes (Signed)
 Pt states that he was feeling slightly dizzy at home and took his BP and noted that his BP was 90 systolic.  Pt is a thin person and states that he has been losing weight.  No CP or sob with this.

## 2024-03-14 NOTE — ED Notes (Signed)
 No answer

## 2024-03-14 NOTE — ED Provider Notes (Signed)
 " [place] EMERGENCY DEPARTMENT AT [facility] Provider Note   CSN: [id number] Arrival date & time: 03/14/24  1653     Patient presents with: Hypotension   Luis Chen is a 84 y.o. male.   HPI   84 year old pleasant male presents the emergency department with concern for low blood pressure and lightheadedness.  Patient states over the last month he has had unintentional weight loss.  He is followed through the [facility].  He is currently on supplemental shakes for energy/weight gain.  He presents today because he was feeling lightheaded and checked his blood pressure which was low.  States he is otherwise been compliant with his home medications.  He did not have any syncope, chest pain, palpitations.  He did have about 4-5 episodes of diarrhea 2 days ago and endorses decreased appetite but denies any fever, vomiting or genitourinary symptoms.  Prior to Admission medications  Medication Sig Start Date End Date Taking? Authorizing Provider  acetic acid 2 % otic solution  12/26/19   [provider]  aspirin  81 MG EC tablet Take 1 tablet (81 mg total) by mouth daily. 07/28/19   Meng, Hao, PA  carvedilol  (COREG ) 6.25 MG tablet Take 0.5 tablets (3.125 mg total) by mouth 2 (two) times daily with a meal. 05/28/20   [name], MD  cyclobenzaprine  (FLEXERIL ) 10 MG tablet Take 5 mg by mouth 3 (three) times daily. *twice daily and at bedtime for muscle pain/cramping* 03/08/23   [provider]  diclofenac Sodium (VOLTAREN) 1 % GEL Apply topically See admin instructions. APPLY 2 GRAMS TO AFFECTED AREA FOUR TIMES A DAY AS NEEDED FOR UPPER EXTREMITY JOINT PAIN FOR USE WHEN NOT TAKING THE SAME MEDICATION BY MOUTH OR OTHER NSAID MEDICATION BY MOUTH FOR USE WHEN NOT TAKING THE SAME MEDICATION BY MOUTH OR OTHER NSAID MEDICATION BY MOUTH 03/08/23   [provider]  ezetimibe  (ZETIA ) 10 MG tablet Take 1 tablet (10 mg total) by mouth daily. 12/05/18 12/31/23  Rolan Ezra RAMAN,  MD  Ferrous Gluconate  324 (37.5 Fe) MG TABS TAKE 1 TABLET BY MOUTH TWICE DAILY WITH MEALS 02/08/18   Amadeo Windell SAILOR, MD  furosemide  (LASIX ) 20 MG tablet Take 20 mg by mouth daily.    [provider]  latanoprost  (XALATAN ) 0.005 % ophthalmic solution Place 1 drop into both eyes daily. 01/22/17   [provider]  Nutritional Supplements (SUPLENA/CARB STEADY) LIQD Take by mouth.    [provider]  oxyCODONE -acetaminophen  (PERCOCET/ROXICET) 5-325 MG tablet Take 1 tablet by mouth every 6 (six) hours as needed for severe pain (pain score 7-10). 03/09/23   Henderly, Britni A, PA-C  rosuvastatin  (CRESTOR ) 40 MG tablet TAKE 1 TABLET BY MOUTH ONCE DAILY AT  6PM 11/07/18   [name], MD  sildenafil (VIAGRA) 100 MG tablet Take by mouth. 03/24/23   [provider]  spironolactone  (ALDACTONE ) 25 MG tablet Take 1 tablet (25 mg total) by mouth daily. 12/06/18 12/31/23  Rolan Ezra RAMAN, MD  traZODone  (DESYREL ) 150 MG tablet Take 150 mg by mouth at bedtime as needed for sleep.     [provider]    Allergies: Ace inhibitors and Entresto  [sacubitril-valsartan]    Review of Systems  Constitutional:  Positive for appetite change, fatigue and unexpected weight change. Negative for fever.  Respiratory:  Negative for shortness of breath.   Cardiovascular:  Negative for chest pain.  Gastrointestinal:  Positive for diarrhea. Negative for abdominal pain and vomiting.  Skin:  Negative for rash.  Neurological:  Negative for headaches.    Updated Vital Signs BP 113/70   Pulse 81   Temp (!) 97.5 F (36.4 C) (Oral)   Resp (!) 24   SpO2 95%   Physical Exam Vitals and nursing note reviewed.  Constitutional:      Appearance: Normal appearance.  HENT:     Head: Normocephalic.     Mouth/Throat:     Mouth: Mucous membranes are moist.  Cardiovascular:     Rate and Rhythm: Normal rate.  Pulmonary:     Effort: Pulmonary effort is normal. No respiratory distress.   Abdominal:     Palpations: Abdomen is soft.     Comments: States that there is no tenderness on abdominal exam but has some mild involuntary reaction to palpation of all 4 quadrants  Skin:    General: Skin is warm.  Neurological:     Mental Status: He is alert and oriented to person, place, and time. Mental status is at baseline.  Psychiatric:        Mood and Affect: Mood normal.     (all labs ordered are listed, but only abnormal results are displayed) Labs Reviewed  CBC WITH DIFFERENTIAL/PLATELET - Abnormal; Notable for the following components:      Result Value   RBC 2.93 (*)    Hemoglobin 9.2 (*)    HCT 26.0 (*)    RDW 17.7 (*)    All other components within normal limits  COMPREHENSIVE METABOLIC PANEL WITH GFR - Abnormal; Notable for the following components:   CO2 21 (*)    Glucose, Bld 147 (*)    BUN 27 (*)    Creatinine, Ser 2.57 (*)    Albumin  3.3 (*)    AST 222 (*)    ALT 285 (*)    Alkaline Phosphatase 1,876 (*)    Total Bilirubin 8.2 (*)    GFR, Estimated 24 (*)    All other components within normal limits  TROPONIN T, HIGH SENSITIVITY - Abnormal; Notable for the following components:   Troponin T High Sensitivity 26 (*)    All other components within normal limits  CULTURE, BLOOD (ROUTINE X 2)  CULTURE, BLOOD (ROUTINE X 2)  LACTIC ACID, PLASMA  PROTIME-INR  URINALYSIS, ROUTINE W REFLEX MICROSCOPIC  LIPASE, BLOOD  TROPONIN T, HIGH SENSITIVITY    EKG: EKG Interpretation Date/Time:  Tuesday March 14 2024 17:38:08 EST Ventricular Rate:  71 PR Interval:  163 QRS Duration:  101 QT Interval:  408 QTC Calculation: 444 R Axis:   -24  Text Interpretation: Sinus rhythm Borderline left axis deviation Abnormal R-wave progression, early transition Confirmed by Bari Flank [phone]) on 03/14/2024 6:09:19 PM  Radiology: No results found.   Procedures   Medications Ordered in the ED  sodium chloride  0.9 % bolus 1,000 mL (1,000 mLs Intravenous New  Bag/Given 03/14/24 1736)                                    Medical Decision Making Amount and/or Complexity of Data Reviewed Radiology: ordered.  Risk Prescription drug management. Decision regarding hospitalization.   84 year old male presents emergency department concern for lightheadedness, hypotension.  Endorses unintentional weight loss over the past month with decreased appetite and energy.  Patient was slightly hypotensive on arrival but with a maintained MAP.  Blood pressure is fluid responsive.  No fever.  Blood work is concerning for  worsening CKD along with a transaminitis and a bilirubin over 8.  Lipase is slightly elevated.  Baseline anemia, denies any black stools.  Troponin is slightly elevated but flat, no active chest pain or acute changes on EKG.  CT of the abdomen pelvis shows fullness of the pancreatic head with concern for duct dilation.  Consulted with gastroenterologist Dr. Suzann from [facility].  She recommends admission, preferably at [facility] long, n.p.o. at midnight with anticipation for MRCP and further dedicated imaging tomorrow.  Patient has been updated and understands.  Patients evaluation and results requires admission for further treatment and care.  Spoke with hospitalist, reviewed patient's ED course and they accept admission.  Patient agrees with admission plan, offers no new complaints and is stable/unchanged at time of admit.     Final diagnoses:  None    ED Discharge Orders     None          Bari Roxie HERO, DO 03/14/24 2107  "

## 2024-03-14 NOTE — ED Provider Triage Note (Signed)
 Emergency Medicine Provider Triage Evaluation Note  Luis Chen , a 84 y.o. male  was evaluated in triage.  Pt complains of low BP, 90/48 at home. Takes BP medicine (losartan , no changes in dose). Felt a little dizzy (light headed, not presyncopal) so he checked his BP. No longer feeling dizzy.  Weight loss over the past few months, unintentional. Does not eat regularly.  Goes to the [facility], told to take energy drinks to increase weight.  Normally 112/70. Review of Systems  Positive: Dizziness, weak (both resolved)  Negative:   Physical Exam  There were no vitals taken for this visit. Gen:   Awake, no distress   Resp:  Normal effort  MSK:   Moves extremities without difficulty  Other:    Medical Decision Making  Medically screening exam initiated at 5:16 PM.  Appropriate orders placed.  Luis Chen was informed that the remainder of the evaluation will be completed by another provider, this initial triage assessment does not replace that evaluation, and the importance of remaining in the ED until their evaluation is complete.     Beverley Leita LABOR, PA-C 03/14/24 1720

## 2024-03-15 ENCOUNTER — Inpatient Hospital Stay (HOSPITAL_COMMUNITY)

## 2024-03-15 DIAGNOSIS — K8689 Other specified diseases of pancreas: Secondary | ICD-10-CM | POA: Diagnosis not present

## 2024-03-15 DIAGNOSIS — R338 Other retention of urine: Secondary | ICD-10-CM | POA: Diagnosis present

## 2024-03-15 DIAGNOSIS — R935 Abnormal findings on diagnostic imaging of other abdominal regions, including retroperitoneum: Secondary | ICD-10-CM | POA: Diagnosis not present

## 2024-03-15 DIAGNOSIS — M170 Bilateral primary osteoarthritis of knee: Secondary | ICD-10-CM | POA: Diagnosis not present

## 2024-03-15 DIAGNOSIS — I2510 Atherosclerotic heart disease of native coronary artery without angina pectoris: Secondary | ICD-10-CM | POA: Diagnosis present

## 2024-03-15 DIAGNOSIS — E876 Hypokalemia: Secondary | ICD-10-CM | POA: Diagnosis present

## 2024-03-15 DIAGNOSIS — I428 Other cardiomyopathies: Secondary | ICD-10-CM | POA: Diagnosis present

## 2024-03-15 DIAGNOSIS — Z681 Body mass index (BMI) 19 or less, adult: Secondary | ICD-10-CM | POA: Diagnosis not present

## 2024-03-15 DIAGNOSIS — I5020 Unspecified systolic (congestive) heart failure: Secondary | ICD-10-CM | POA: Diagnosis not present

## 2024-03-15 DIAGNOSIS — E039 Hypothyroidism, unspecified: Secondary | ICD-10-CM | POA: Diagnosis present

## 2024-03-15 DIAGNOSIS — I25118 Atherosclerotic heart disease of native coronary artery with other forms of angina pectoris: Secondary | ICD-10-CM | POA: Diagnosis not present

## 2024-03-15 DIAGNOSIS — N183 Chronic kidney disease, stage 3 unspecified: Secondary | ICD-10-CM | POA: Diagnosis not present

## 2024-03-15 DIAGNOSIS — R7401 Elevation of levels of liver transaminase levels: Secondary | ICD-10-CM | POA: Diagnosis present

## 2024-03-15 DIAGNOSIS — M25462 Effusion, left knee: Secondary | ICD-10-CM | POA: Diagnosis not present

## 2024-03-15 DIAGNOSIS — E782 Mixed hyperlipidemia: Secondary | ICD-10-CM | POA: Diagnosis present

## 2024-03-15 DIAGNOSIS — N189 Chronic kidney disease, unspecified: Secondary | ICD-10-CM | POA: Diagnosis not present

## 2024-03-15 DIAGNOSIS — I130 Hypertensive heart and chronic kidney disease with heart failure and stage 1 through stage 4 chronic kidney disease, or unspecified chronic kidney disease: Secondary | ICD-10-CM | POA: Diagnosis present

## 2024-03-15 DIAGNOSIS — R509 Fever, unspecified: Secondary | ICD-10-CM | POA: Diagnosis not present

## 2024-03-15 DIAGNOSIS — E43 Unspecified severe protein-calorie malnutrition: Secondary | ICD-10-CM | POA: Diagnosis present

## 2024-03-15 DIAGNOSIS — K831 Obstruction of bile duct: Secondary | ICD-10-CM | POA: Diagnosis present

## 2024-03-15 DIAGNOSIS — I071 Rheumatic tricuspid insufficiency: Secondary | ICD-10-CM | POA: Diagnosis present

## 2024-03-15 DIAGNOSIS — M25561 Pain in right knee: Secondary | ICD-10-CM | POA: Diagnosis not present

## 2024-03-15 DIAGNOSIS — K315 Obstruction of duodenum: Secondary | ICD-10-CM | POA: Diagnosis present

## 2024-03-15 DIAGNOSIS — K449 Diaphragmatic hernia without obstruction or gangrene: Secondary | ICD-10-CM | POA: Diagnosis not present

## 2024-03-15 DIAGNOSIS — R7303 Prediabetes: Secondary | ICD-10-CM | POA: Diagnosis present

## 2024-03-15 DIAGNOSIS — C259 Malignant neoplasm of pancreas, unspecified: Secondary | ICD-10-CM | POA: Diagnosis not present

## 2024-03-15 DIAGNOSIS — D638 Anemia in other chronic diseases classified elsewhere: Secondary | ICD-10-CM | POA: Diagnosis not present

## 2024-03-15 DIAGNOSIS — M009 Pyogenic arthritis, unspecified: Secondary | ICD-10-CM | POA: Diagnosis present

## 2024-03-15 DIAGNOSIS — N1831 Chronic kidney disease, stage 3a: Secondary | ICD-10-CM | POA: Diagnosis not present

## 2024-03-15 DIAGNOSIS — I129 Hypertensive chronic kidney disease with stage 1 through stage 4 chronic kidney disease, or unspecified chronic kidney disease: Secondary | ICD-10-CM | POA: Diagnosis not present

## 2024-03-15 DIAGNOSIS — M25461 Effusion, right knee: Secondary | ICD-10-CM | POA: Diagnosis not present

## 2024-03-15 DIAGNOSIS — D631 Anemia in chronic kidney disease: Secondary | ICD-10-CM | POA: Diagnosis present

## 2024-03-15 DIAGNOSIS — N179 Acute kidney failure, unspecified: Secondary | ICD-10-CM | POA: Diagnosis present

## 2024-03-15 DIAGNOSIS — D472 Monoclonal gammopathy: Secondary | ICD-10-CM | POA: Diagnosis not present

## 2024-03-15 DIAGNOSIS — H409 Unspecified glaucoma: Secondary | ICD-10-CM | POA: Diagnosis present

## 2024-03-15 DIAGNOSIS — N1832 Chronic kidney disease, stage 3b: Secondary | ICD-10-CM | POA: Diagnosis present

## 2024-03-15 DIAGNOSIS — I5022 Chronic systolic (congestive) heart failure: Secondary | ICD-10-CM | POA: Diagnosis present

## 2024-03-15 DIAGNOSIS — E872 Acidosis, unspecified: Secondary | ICD-10-CM | POA: Diagnosis present

## 2024-03-15 DIAGNOSIS — M25562 Pain in left knee: Secondary | ICD-10-CM | POA: Diagnosis not present

## 2024-03-15 DIAGNOSIS — F32A Depression, unspecified: Secondary | ICD-10-CM | POA: Diagnosis present

## 2024-03-15 DIAGNOSIS — K862 Cyst of pancreas: Secondary | ICD-10-CM | POA: Diagnosis not present

## 2024-03-15 DIAGNOSIS — I77810 Thoracic aortic ectasia: Secondary | ICD-10-CM | POA: Diagnosis present

## 2024-03-15 DIAGNOSIS — C250 Malignant neoplasm of head of pancreas: Secondary | ICD-10-CM | POA: Diagnosis present

## 2024-03-15 DIAGNOSIS — N320 Bladder-neck obstruction: Secondary | ICD-10-CM | POA: Diagnosis present

## 2024-03-15 DIAGNOSIS — R748 Abnormal levels of other serum enzymes: Secondary | ICD-10-CM | POA: Diagnosis not present

## 2024-03-15 LAB — URINALYSIS, ROUTINE W REFLEX MICROSCOPIC
Glucose, UA: NEGATIVE mg/dL
Ketones, ur: NEGATIVE mg/dL
Leukocytes,Ua: NEGATIVE
Nitrite: NEGATIVE
Protein, ur: 100 mg/dL — AB
Specific Gravity, Urine: 1.015 (ref 1.005–1.030)
pH: 6 (ref 5.0–8.0)

## 2024-03-15 LAB — BASIC METABOLIC PANEL WITH GFR
Anion gap: 11 (ref 5–15)
BUN: 25 mg/dL — ABNORMAL HIGH (ref 8–23)
CO2: 18 mmol/L — ABNORMAL LOW (ref 22–32)
Calcium: 8.9 mg/dL (ref 8.9–10.3)
Chloride: 109 mmol/L (ref 98–111)
Creatinine, Ser: 2.75 mg/dL — ABNORMAL HIGH (ref 0.61–1.24)
GFR, Estimated: 22 mL/min — ABNORMAL LOW
Glucose, Bld: 127 mg/dL — ABNORMAL HIGH (ref 70–99)
Potassium: 4.3 mmol/L (ref 3.5–5.1)
Sodium: 139 mmol/L (ref 135–145)

## 2024-03-15 LAB — URINALYSIS, MICROSCOPIC (REFLEX): Squamous Epithelial / HPF: NONE SEEN /HPF (ref 0–5)

## 2024-03-15 LAB — SODIUM, URINE, RANDOM: Sodium, Ur: 36 mmol/L

## 2024-03-15 LAB — CREATININE, URINE, RANDOM: Creatinine, Urine: 93 mg/dL

## 2024-03-15 MED ORDER — SENNA 8.6 MG PO TABS
1.0000 | ORAL_TABLET | Freq: Two times a day (BID) | ORAL | Status: DC
  Administered 2024-03-15 – 2024-03-29 (×21): 8.6 mg via ORAL
  Filled 2024-03-15 (×24): qty 1

## 2024-03-15 MED ORDER — LATANOPROST 0.005 % OP SOLN
1.0000 [drp] | Freq: Every day | OPHTHALMIC | Status: DC
  Administered 2024-03-15 – 2024-03-30 (×15): 1 [drp] via OPHTHALMIC
  Filled 2024-03-15: qty 2.5

## 2024-03-15 MED ORDER — SODIUM CHLORIDE 0.45 % IV SOLN: INTRAVENOUS | Status: DC

## 2024-03-15 MED ORDER — PIPERACILLIN-TAZOBACTAM 3.375 G IVPB
3.3750 g | Freq: Two times a day (BID) | INTRAVENOUS | Status: DC
  Administered 2024-03-15: 3.375 g via INTRAVENOUS
  Filled 2024-03-15: qty 50

## 2024-03-15 MED ORDER — GADOBUTROL 1 MMOL/ML IV SOLN
6.0000 mL | Freq: Once | INTRAVENOUS | Status: AC | PRN
  Administered 2024-03-15: 6 mL via INTRAVENOUS

## 2024-03-15 MED ORDER — ACETAMINOPHEN 325 MG PO TABS
650.0000 mg | ORAL_TABLET | Freq: Four times a day (QID) | ORAL | Status: DC | PRN
  Administered 2024-03-15 – 2024-03-28 (×9): 650 mg via ORAL
  Filled 2024-03-15 (×11): qty 2

## 2024-03-15 MED ORDER — PIPERACILLIN-TAZOBACTAM IN DEX 2-0.25 GM/50ML IV SOLN
2.2500 g | Freq: Three times a day (TID) | INTRAVENOUS | Status: DC
  Administered 2024-03-15 – 2024-03-21 (×16): 2.25 g via INTRAVENOUS
  Filled 2024-03-15 (×18): qty 50

## 2024-03-15 MED ORDER — ACETAMINOPHEN 650 MG RE SUPP: 650.0000 mg | Freq: Four times a day (QID) | RECTAL | Status: DC | PRN

## 2024-03-15 NOTE — Progress Notes (Signed)
 PHARMACY NOTE:  ANTIMICROBIAL RENAL DOSAGE ADJUSTMENT  Current antimicrobial regimen includes a mismatch between antimicrobial dosage and estimated renal function.  As per policy approved by the Pharmacy & Therapeutics and Medical Executive Committees, the antimicrobial dosage will be adjusted accordingly.  Current antimicrobial dosage:  Zosyn  3.375g IV q12h  Indication: intra-abdominal infection  Renal Function:  Estimated Creatinine Clearance: 16.7 mL/min (A) (by C-G formula based on SCr of 2.75 mg/dL (H)). []      On intermittent HD, scheduled: []      On CRRT    Antimicrobial dosage has been changed to:  Zosyn  2.25g IV q8hrs  Additional comments: N/A   Thank you for allowing pharmacy to be a part of this patient's care.  Lacinda Moats, PharmD Clinical Pharmacist  1/7/20263:53 PM

## 2024-03-15 NOTE — H&P (Addendum)
 " [facility] History and Physical  Luis Chen FMW:[id number] DOB: Mar 03, 1941 DOA: 03/14/2024   PCP: [name], MD  Specialists: Followed by nephrology Dr. Rayburn.  Dr. Lavona with cardiology.  Also [facility] for [facility]US.  Chief Complaint: Weight loss, dizziness  HPI: Luis Chen is a 84 y.o. male this is a 84 year old African-American male with past medical history of chronic kidney disease stage IIIb, coronary artery disease, MGUS, hyperlipidemia who follows with providers here locally as well as at the [facility].  He presented with concerns for lightheadedness and low blood pressure.  Also mentioned that he has lost about 15 pounds in the last few weeks.  Denied any abdominal pain nausea vomiting.  No chest pain or shortness of breath.  Denies any blood in the stool.  No difficulty urinating more than usual.  In the emergency department he underwent blood work which revealed a significantly abnormal transaminases with significantly elevated alkaline phosphatase and bilirubin.  He was found to be anemic.  CT scan raise concern for a pancreatic mass.  Patient was hospitalized for further management.  Home Medications: This list is not reconciled yet Prior to Admission medications  Medication Sig Start Date End Date Taking? Authorizing Provider  acetic acid 2 % otic solution  12/26/19   [provider]  aspirin  81 MG EC tablet Take 1 tablet (81 mg total) by mouth daily. 07/28/19   Meng, Hao, PA  carvedilol  (COREG ) 6.25 MG tablet Take 0.5 tablets (3.125 mg total) by mouth 2 (two) times daily with a meal. 05/28/20   [name], MD  cyclobenzaprine  (FLEXERIL ) 10 MG tablet Take 5 mg by mouth 3 (three) times daily. *twice daily and at bedtime for muscle pain/cramping* 03/08/23   [provider]  diclofenac Sodium (VOLTAREN) 1 % GEL Apply topically See admin instructions. APPLY 2 GRAMS TO AFFECTED AREA FOUR TIMES A DAY AS NEEDED FOR  UPPER EXTREMITY JOINT PAIN FOR USE WHEN NOT TAKING THE SAME MEDICATION BY MOUTH OR OTHER NSAID MEDICATION BY MOUTH FOR USE WHEN NOT TAKING THE SAME MEDICATION BY MOUTH OR OTHER NSAID MEDICATION BY MOUTH 03/08/23   [provider]  ezetimibe  (ZETIA ) 10 MG tablet Take 1 tablet (10 mg total) by mouth daily. 12/05/18 12/31/23  Rolan Ezra RAMAN, MD  Ferrous Gluconate  324 (37.5 Fe) MG TABS TAKE 1 TABLET BY MOUTH TWICE DAILY WITH MEALS 02/08/18   Shadad, Firas N, MD  furosemide  (LASIX ) 20 MG tablet Take 20 mg by mouth daily.    [provider]  latanoprost  (XALATAN ) 0.005 % ophthalmic solution Place 1 drop into both eyes daily. 01/22/17   [provider]  Nutritional Supplements (SUPLENA/CARB STEADY) LIQD Take by mouth.    [provider]  oxyCODONE -acetaminophen  (PERCOCET/ROXICET) 5-325 MG tablet Take 1 tablet by mouth every 6 (six) hours as needed for severe pain (pain score 7-10). 03/09/23   Henderly, Britni A, PA-C  rosuvastatin  (CRESTOR ) 40 MG tablet TAKE 1 TABLET BY MOUTH ONCE DAILY AT  6PM 11/07/18   [name], MD  sildenafil (VIAGRA) 100 MG tablet Take by mouth. 03/24/23   [provider]  spironolactone  (ALDACTONE ) 25 MG tablet Take 1 tablet (25 mg total) by mouth daily. 12/06/18 12/31/23  Rolan Ezra RAMAN, MD  traZODone  (DESYREL ) 150 MG tablet Take 150 mg by mouth at bedtime as needed for sleep.     [provider]    Allergies: Allergies[1]  Past Medical History: Past  Medical History:  Diagnosis Date   Anemia, chronic disease 07/01/2015   Angina decubitus    Benign essential hypertension 10/31/2008   Qualifier: Diagnosis of  By: Lavona, MD, CODY Agent     CAD (coronary artery disease)    non obstructive. Left main normal. LAd proximal long 25% stenosis, termingating as focal 50% prox to mid lesion. First & second diag were small, normal. Circumflex in proximal av groove had luminal irregularities.. Was large mid obtuse marg which was  branching, scattered luminal irregul. Infer branch did supply some septal perforators. Was PDA,small & normal.  < 1.64mm. There was prox 95% stenosis   CARDIOMYOPATHY 11/27/2008   CKD (chronic kidney disease)    Hyperlipidemia    transient history, controlled on diet   Hypertension    Mixed hyperlipidemia 10/30/2008   Qualifier: Diagnosis of  By: Leana, CNA, [name]     Non-traumatic rhabdomyolysis 07/03/2015   Polyarthropathy 07/01/2015   Primary osteoarthritis of knees, bilateral 12/10/2014   Pyogenic arthritis of left knee joint (HCC)    Swelling of left knee joint 07/01/2015   Systolic HF (heart failure) (HCC) 02/08/2018    Past Surgical History:  Procedure Laterality Date   CORONARY ARTERY BYPASS GRAFT N/A 02/17/2018   Procedure: CORONARY ARTERY BYPASS GRAFTING (CABG);  Surgeon: Lucas Dorise POUR, MD;  Location: [facility] OR;  Service: Open Heart Surgery;  Laterality: N/A;  Times 3 using endoscopically harvested right saphenous vein.     KNEE ARTHROSCOPY Left 07/01/2015   Procedure: Irrigation and debridement left knee arthroscopy;  Surgeon: Evalene JONETTA Chancy, MD;  Location: [facility] OR;  Service: Orthopedics;  Laterality: Left;   RIGHT/LEFT HEART CATH AND CORONARY ANGIOGRAPHY N/A 02/08/2018   Procedure: RIGHT/LEFT HEART CATH AND CORONARY ANGIOGRAPHY;  Surgeon: Dann Candyce RAMAN, MD;  Location: [facility] INVASIVE CV LAB;  Service: Cardiovascular;  Laterality: N/A;   TEE WITHOUT CARDIOVERSION N/A 02/17/2018   Procedure: TRANSESOPHAGEAL ECHOCARDIOGRAM (TEE);  Surgeon: Lucas Dorise POUR, MD;  Location: [facility] OR;  Service: Open Heart Surgery;  Laterality: N/A;    Social History: Denies smoking use.  Occasional beer consumption.  No illicit drug use.  Family History:  Family History  Problem Relation Age of Onset   Diabetes Mother    Arthritis Mother    Heart disease Mother    Hyperlipidemia Mother    Hypertension Mother    Heart attack Mother 16   Heart disease Brother        had in his 38s. Had CABG x2    Hypothyroidism Daughter    Stroke Daughter      Review of Systems - History obtained from the patient General ROS: positive for  - weight loss Psychological ROS: positive for - anxiety Ophthalmic ROS: negative ENT ROS: negative Allergy and Immunology ROS: negative Hematological and Lymphatic ROS: negative Endocrine ROS: negative Respiratory ROS: no cough, shortness of breath, or wheezing Cardiovascular ROS: no chest pain or dyspnea on exertion Gastrointestinal ROS: no abdominal pain, change in bowel habits, or black or bloody stools Genito-Urinary ROS: no dysuria, trouble voiding, or hematuria Musculoskeletal ROS: negative Neurological ROS: no TIA or stroke symptoms Dermatological ROS: negative  Physical Examination  Vitals:   03/15/24 1200 03/15/24 1300 03/15/24 1400 03/15/24 1530  BP: (!) 117/58 112/64 120/67 120/74  Pulse: 68 71 70 84  Resp: 13 17 16 18   Temp:    98.3 F (36.8 C)  TempSrc:    Oral  SpO2: 96% 98% 98% 99%  Weight:    60.1 kg  Height:    5' 10 (1.778 m)    BP 120/74 (BP Location: Right Arm)   Pulse 84   Temp 98.3 F (36.8 C) (Oral)   Resp 18   Ht 5' 10 (1.778 m)   Wt 60.1 kg   SpO2 99%   BMI 19.01 kg/m   General appearance: alert, cooperative, appears stated age, and no distress Head: Normocephalic, without obvious abnormality, atraumatic Eyes: conjunctivae/corneas clear. PERRL, EOM's intact.  Noted to be jaundiced Throat: lips, mucosa, and tongue normal; teeth and gums normal Neck: no adenopathy, no carotid bruit, no JVD, supple, symmetrical, trachea midline, and thyroid  not enlarged, symmetric, no tenderness/mass/nodules Resp: clear to auscultation bilaterally Cardio: regular rate and rhythm, S1, S2 normal, no murmur, [place], rub or gallop GI: soft, non-tender; bowel sounds normal; no masses,  no organomegaly Extremities: extremities normal, atraumatic, no cyanosis or edema Pulses: 2+ and symmetric Skin: Skin color, texture, turgor normal.  No rashes or lesions Lymph nodes: Cervical, supraclavicular, and axillary nodes normal. Neurologic: He is alert oriented x 3.  No obvious focal neurological deficits noted.   Labs on Admission: I have personally reviewed following labs and imaging studies  CBC: Recent Labs  Lab 03/14/24 1730  WBC 6.1  NEUTROABS 3.9  HGB 9.2*  HCT 26.0*  MCV 88.7  PLT 258   Basic Metabolic Panel: Recent Labs  Lab 03/14/24 1730 03/15/24 0947  NA 137 139  K 4.5 4.3  CL 103 109  CO2 21* 18*  GLUCOSE 147* 127*  BUN 27* 25*  CREATININE 2.57* 2.75*  CALCIUM  9.7 8.9   GFR: Estimated Creatinine Clearance: 16.7 mL/min (A) (by C-G formula based on SCr of 2.75 mg/dL (H)). Liver Function Tests: Recent Labs  Lab 03/14/24 1730  AST 222*  ALT 285*  ALKPHOS 1,876*  BILITOT 8.2*  PROT 6.7  ALBUMIN  3.3*   Recent Labs  Lab 03/14/24 1850  LIPASE 62*    Coagulation Profile: Recent Labs  Lab 03/14/24 1730  INR 1.0    Radiological Exams on Admission: CT ABDOMEN PELVIS WO CONTRAST Result Date: 03/14/2024 EXAM: CT ABDOMEN AND PELVIS WITHOUT CONTRAST 03/14/2024 06:52:08 PM TECHNIQUE: CT of the abdomen and pelvis was performed without the administration of intravenous contrast. Multiplanar reformatted images are provided for review. Automated exposure control, iterative reconstruction, and/or weight-based adjustment of the mA/kV was utilized to reduce the radiation dose to as low as reasonably achievable. COMPARISON: None available. CLINICAL HISTORY: Abdominal pain, acute, nonlocalized; Hypotensive, elevated liver/bilirubin. Dizziness. Weight loss. FINDINGS: LOWER CHEST: Sternotomy wires. Mild dependent changes in the lung bases. LIVER: Diffuse intra- and extrahepatic bile duct dilatation. GALLBLADDER AND BILE DUCTS: The gallbladder is mildly distended. No gallstones are identified. Diffuse intra- and extrahepatic bile duct dilatation. SPLEEN: The spleen is normal. No nodules. PANCREAS: Pancreatic  ductal dilatation with fullness in the head of the pancreas. Unenhanced evaluation is limited, but this likely represents a pancreatic mass measuring about 2.5 cm in diameter. Correlate with MRCP or contrast-enhanced CT abdomen and pelvis for further evaluation. ADRENAL GLANDS: No acute abnormality. KIDNEYS, URETERS AND BLADDER: Kidneys are normal. No stones in the kidneys or ureters. No hydronephrosis. No perinephric or periureteral stranding. The bladder wall is mildly thickened, likely due to outlet obstruction. GI AND BOWEL: Stomach, small bowel, and colon are not abnormally distended. No discrete wall thickening is appreciated. There is no bowel obstruction. Small left inguinal hernia containing small bowel without proximal obstruction. PERITONEUM AND RETROPERITONEUM: No ascites. No free air. VASCULATURE: Aorta is normal  in caliber. Calcification of the aorta. No aneurysm. LYMPH NODES: No lymphadenopathy. REPRODUCTIVE ORGANS: The prostate gland is enlarged. Measuring 6.5 cm in diameter. BONES AND SOFT TISSUES: Degenerative changes in the spine. No focal bone lesions. Left lumbar triangle abdominal wall hernia containing fat. IMPRESSION: 1. Fullness in the head of the pancreas with associated pancreatic and bile duct dilatation, likely representing a pancreatic adenocarcinoma measuring about 2.5 cm in diameter; unenhanced evaluation is limited, and MRCP or contrast-enhanced CT abdomen and pelvis is recommended for further evaluation. Electronically signed by: Elsie Gravely MD 03/14/2024 07:22 PM EST RP Workstation: [id number]    My interpretation of Electrocardiogram: Sinus rhythm in the 70s.  Normal intervals.  No Q waves.  No concerning ST or T wave changes.   Problem List  Principal Problem:   Obstructive jaundice (HCC) Active Problems:   Anemia, chronic disease   Chronic kidney disease (CKD), stage III (moderate) (HCC)   MGUS (monoclonal gammopathy of unknown significance)   Coronary artery  disease involving native coronary artery of native heart with other form of angina pectoris   Transaminitis   Assessment: This is a 84 year old African-American male with past medical history as stated earlier comes in with weight loss feeling dizzy and lightheaded.  Found to have remarkably abnormal liver function tests with concern for obstructive jaundice.  He is found to have a possible pancreatic mass.  He is hospitalized for further management.  Plan:  Obstructive jaundice/pancreatic mass Case was discussed with gastroenterology who recommended hospitalization.   Patient was started on Zosyn  per GI. His bilirubin and alkaline phosphatase levels noted to be significantly elevated.  Lipase level was 62.  MRCP to be done today.  Gastroenterology to evaluate him later today or tomorrow.  He be given clear liquids for today and then kept n.p.o. past midnight in case of procedure needs to be performed tomorrow depending on MRCP findings. He is no longer lightheaded.  Able to sit comfortably on the bed without any symptoms.  Vital signs have been stable for the past several hours. Check CA 19-9.  Acute on chronic kidney disease stage IIIb His creatinine was 1.38 in October 2025.  Came in with a creatinine of 2.57.  Also noted to have some metabolic acidosis.  There is appears to be some degree of hypovolemia.  Kidneys look normal on the CT scan without any hydronephrosis.  Will give him IV fluids and recheck labs in the morning.  UA reviewed.  Normocytic anemia Hemoglobin slightly lower than his baseline which appears to be between 10-12.  No overt bleeding noted by patient.  Will check anemia panel.  FOBT.  History of coronary artery disease He is status post CABG.  Cardiac status seems to be stable.  Hold antiplatelets for now.  Monitor on telemetry.  History of glaucoma Continue with eyedrops.  Medication reconciliation has not been completed yet   DVT Prophylaxis: SCDs for now Code  Status: Full code Family Communication: Discussed with patient Disposition: Hopefully return home when stable Consults called: [facility] Admission Status: Status is: Inpatient Remains inpatient appropriate because: Obstructive jaundice    Severity of Illness: The appropriate patient status for this patient is INPATIENT. Inpatient status is judged to be reasonable and necessary in order to provide the required intensity of service to ensure the patient's safety. The patient's presenting symptoms, physical exam findings, and initial radiographic and laboratory data in the context of their chronic comorbidities is felt to place them at high risk for further clinical  deterioration. Furthermore, it is not anticipated that the patient will be medically stable for discharge from the hospital within 2 midnights of admission.   * I certify that at the point of admission it is my clinical judgment that the patient will require inpatient hospital care spanning beyond 2 midnights from the point of admission due to high intensity of service, high risk for further deterioration and high frequency of surveillance required.*   Further management decisions will depend on results of further testing and patient's response to treatment.   Analiese Krupka  [facility] Pager on [email or web address]  03/15/2024, 5:08 PM     [1]  Allergies Allergen Reactions   Ace Inhibitors Swelling    Facial swelling    Entresto  [Sacubitril-Valsartan] Swelling    Angioedema with ACE Inhibitor   "

## 2024-03-15 NOTE — ED Notes (Signed)
 Purple man Green.

## 2024-03-15 NOTE — ED Notes (Signed)
 Called George at [facility] for transport 12:57-TC

## 2024-03-16 DIAGNOSIS — R17 Unspecified jaundice: Principal | ICD-10-CM

## 2024-03-16 DIAGNOSIS — K831 Obstruction of bile duct: Secondary | ICD-10-CM | POA: Diagnosis not present

## 2024-03-16 DIAGNOSIS — K8689 Other specified diseases of pancreas: Secondary | ICD-10-CM

## 2024-03-16 DIAGNOSIS — R935 Abnormal findings on diagnostic imaging of other abdominal regions, including retroperitoneum: Secondary | ICD-10-CM | POA: Diagnosis not present

## 2024-03-16 DIAGNOSIS — R7401 Elevation of levels of liver transaminase levels: Secondary | ICD-10-CM | POA: Diagnosis not present

## 2024-03-16 LAB — CBC
HCT: 23.1 % — ABNORMAL LOW (ref 39.0–52.0)
Hemoglobin: 8.4 g/dL — ABNORMAL LOW (ref 13.0–17.0)
MCH: 31.2 pg (ref 26.0–34.0)
MCHC: 36.4 g/dL — ABNORMAL HIGH (ref 30.0–36.0)
MCV: 85.9 fL (ref 80.0–100.0)
Platelets: 249 K/uL (ref 150–400)
RBC: 2.69 MIL/uL — ABNORMAL LOW (ref 4.22–5.81)
RDW: 17.6 % — ABNORMAL HIGH (ref 11.5–15.5)
WBC: 5.7 K/uL (ref 4.0–10.5)
nRBC: 0 % (ref 0.0–0.2)

## 2024-03-16 LAB — COMPREHENSIVE METABOLIC PANEL WITH GFR
ALT: 236 U/L — ABNORMAL HIGH (ref 0–44)
AST: 255 U/L — ABNORMAL HIGH (ref 15–41)
Albumin: 2.7 g/dL — ABNORMAL LOW (ref 3.5–5.0)
Alkaline Phosphatase: 1691 U/L — ABNORMAL HIGH (ref 38–126)
Anion gap: 11 (ref 5–15)
BUN: 22 mg/dL (ref 8–23)
CO2: 20 mmol/L — ABNORMAL LOW (ref 22–32)
Calcium: 9 mg/dL (ref 8.9–10.3)
Chloride: 108 mmol/L (ref 98–111)
Creatinine, Ser: 3.2 mg/dL — ABNORMAL HIGH (ref 0.61–1.24)
GFR, Estimated: 18 mL/min — ABNORMAL LOW
Glucose, Bld: 124 mg/dL — ABNORMAL HIGH (ref 70–99)
Potassium: 4 mmol/L (ref 3.5–5.1)
Sodium: 139 mmol/L (ref 135–145)
Total Bilirubin: 7.4 mg/dL — ABNORMAL HIGH (ref 0.0–1.2)
Total Protein: 5.7 g/dL — ABNORMAL LOW (ref 6.5–8.1)

## 2024-03-16 LAB — FERRITIN: Ferritin: 2730 ng/mL — ABNORMAL HIGH (ref 24–336)

## 2024-03-16 LAB — RETICULOCYTES
Immature Retic Fract: 18.7 % — ABNORMAL HIGH (ref 2.3–15.9)
RBC.: 2.7 MIL/uL — ABNORMAL LOW (ref 4.22–5.81)
Retic Count, Absolute: 37.8 K/uL (ref 19.0–186.0)
Retic Ct Pct: 1.4 % (ref 0.4–3.1)

## 2024-03-16 LAB — VITAMIN B12: Vitamin B-12: 2605 pg/mL — ABNORMAL HIGH (ref 180–914)

## 2024-03-16 LAB — IRON AND TIBC
Iron: 68 ug/dL (ref 45–182)
Saturation Ratios: 32 % (ref 17.9–39.5)
TIBC: 213 ug/dL — ABNORMAL LOW (ref 250–450)
UIBC: 145 ug/dL

## 2024-03-16 LAB — FOLATE: Folate: 15.6 ng/mL

## 2024-03-16 LAB — PROTIME-INR
INR: 1.1 (ref 0.8–1.2)
Prothrombin Time: 14.7 s (ref 11.4–15.2)

## 2024-03-16 LAB — TSH: TSH: 0.838 u[IU]/mL (ref 0.350–4.500)

## 2024-03-16 MED ORDER — SODIUM CHLORIDE 0.45 % IV SOLN: INTRAVENOUS | Status: DC

## 2024-03-16 MED ORDER — SODIUM CHLORIDE 0.9 % IV BOLUS
1000.0000 mL | Freq: Once | INTRAVENOUS | Status: AC
  Administered 2024-03-16: 1000 mL via INTRAVENOUS

## 2024-03-16 MED ORDER — STERILE WATER FOR INJECTION IV SOLN
INTRAVENOUS | Status: DC
  Filled 2024-03-16: qty 150
  Filled 2024-03-16: qty 1000
  Filled 2024-03-16: qty 150

## 2024-03-16 NOTE — Consult Note (Addendum)
 "  Referring Provider: Dr. Subrina Sundil Primary Care Physician:  [name], MD Primary Gastroenterologist:  Sampson Finn PCP  Reason for Consultation:  Elevated LFTs, pancreatic mass  HPI: Luis Chen is a 84 y.o. male with a past medical history of hypertension, hyperlipidemia, coronary artery disease s/p 3 vessel CABG 02/2018, nonischemic cardiomyopathy, chronic systolic CHF, Ace inhibitor induce angioedema, MGUS and CKD stage IIIb.   He developed light headedness, home BP 90/48 therefore he presented to to [facility] ED 03/14/2024 for further evaluation.  No chest pain or shortness of breath.  Labs and the ED showed negative WBC count of 6.1.  Hemoglobin 9.2 down from 11.2 two months ago.  Hematocrit 26.0.  Platelets 258.  BUN 27.  Creatinine 2.57 up from 1.38.  Total bili 8.2.  Alk phos 1876.  AST 222.  ALT 285.  LFTs were normal 2 months ago.  Lipase 62.  Troponin 26.  Repeat troponin 25.  Lactic acid 1.2.  INR 1.0.  Blood cultures no growth at 2 days. CTAP showed fullness in the head of the pancreas measuring 2.5 cm in diameter with associated pancreatic and bile duct dilatation, likely representing a pancreatic adenocarcinoma.   Patient was transferred to [facility] 03/15/2024 for further evaluation for suspected pancreatic malignancy, MRI/MRCP and ERCP.  He remains NPO.  On Zosyn  IV every 8 hours and normal saline IV at 75 cc an hour.  Abdominal MRI/MRCP with and without contrast identified a 3.3 x 2.3 x 2.1 cm pancreatic head mass associated with several dorsal pancreatic duct and biliary dilatation suspicious for pancreatic adenocarcinoma.  Mild gallbladder distention and mild gallbladder wall thickening also noted.  Labs today: WBC 5.7.  Hemoglobin 8.4.  Platelets 249.  BUN 22.  Creatinine 3.20.  Albumin  2.7.  Total bili 7.4.  Alk phos 1691.  AST 255.  ALT 236.  TSH 0.838.  INR 1.0.  Iron  68.  TIBC 213.  Saturation ratio was 32.  Ferritin 2730.  Vitamin B12 level  2605.  Folate 15.6.  CA 19-9 in process.  He endorses losing 15 pounds over the past 4 weeks with decreased appetite, altered sense of taste.  No fevers or night sweats.  No nausea or vomiting.  No GERD symptoms or dysphagia.  No upper or lower abdominal pain.  He typically passes normal brown bowel movement daily, however, over the past weekend he was constipated and took a suppository which resulted in passing a BM.  No bloody or black stools.  Takes ASA 81 mg daily.  He took a second ASA when he felt dizzy at home on 1/6, otherwise no other NSAID use.  Not on AC.  He denies ever having an EGD.  He underwent a colonoscopy around 2010 which he reported was normal.  No known family history of esophageal, gastric, colon, pancreatic, biliary or liver cancer.  Urine has been a little darker for the past few days which is clearing.  Non-smoker.  He drinks a total 3 glasses of wine and 2 beers weekly.  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  On oral iron  at home.  ECHO 05/26/2021:  Left ventricular ejection fraction, by estimation, is 50 to 55%. The left ventricle has low normal function. The left ventricle demonstrates global hypokinesis. Left ventricular diastolic parameters were normal. The average left ventricular global longitudinal strain is -14.6 %. The global longitudinal strain is normal.  1. Right ventricular systolic function is normal. The right ventricular size is normal. There is normal pulmonary artery systolic pressure. 2. 3. Left atrial size was mildly dilated. 4. Right atrial size was mildly dilated. The mitral valve is normal in structure. Mild mitral valve regurgitation. No evidence of mitral stenosis. 5. 6. Tricuspid valve regurgitation is mild to moderate. The aortic valve is tricuspid. There is moderate calcification of the aortic valve. There is moderate thickening of the aortic valve. Aortic valve regurgitation  is mild. Aortic valve sclerosis is present, with no evidence of aortic valve stenosis. 7. Aortic dilatation noted. There is mild dilatation of the aortic root, measuring 40 mm. There is mild dilatation of the ascending aorta, measuring 39 mm. 8. The inferior vena cava is normal in size with greater than 50% respiratory variability, suggesting right atrial pressure of 3 mmHg.   CTAP WITH CONTRAST 03/14/2024:  LOWER CHEST:  Sternotomy wires. Mild dependent changes in the lung bases.    LIVER:  Diffuse intra- and extrahepatic bile duct dilatation.    GALLBLADDER AND BILE DUCTS:  The gallbladder is mildly distended. No gallstones are identified. Diffuse  intra- and extrahepatic bile duct dilatation.    SPLEEN:  The spleen is normal. No nodules.    PANCREAS:  Pancreatic ductal dilatation with fullness in the head of the pancreas.  Unenhanced evaluation is limited, but this likely represents a pancreatic mass  measuring about 2.5 cm in diameter. Correlate with MRCP or contrast-enhanced CT  abdomen and pelvis for further evaluation.    ADRENAL GLANDS:  No acute abnormality.    KIDNEYS, URETERS AND BLADDER:  Kidneys are normal. No stones in the kidneys or ureters. No hydronephrosis. No  perinephric or periureteral stranding. The bladder wall is mildly thickened,  likely due to outlet obstruction.    GI AND BOWEL:  Stomach, small bowel, and colon are not abnormally distended. No discrete wall  thickening is appreciated. There is no bowel obstruction. Small left inguinal  hernia containing small bowel without proximal obstruction.    PERITONEUM AND RETROPERITONEUM:  No ascites. No free air.    VASCULATURE:  Aorta is normal in caliber. Calcification of the aorta. No aneurysm.    LYMPH NODES:  No lymphadenopathy.    REPRODUCTIVE ORGANS:  The prostate gland is enlarged. Measuring 6.5 cm in diameter.    BONES AND SOFT TISSUES:  Degenerative changes in the spine. No focal bone  lesions. Left lumbar triangle  abdominal wall hernia containing fat.    IMPRESSION:  1. Fullness in the head of the pancreas with associated pancreatic and bile  duct dilatation, likely representing a pancreatic adenocarcinoma measuring  about 2.5 cm in diameter; unenhanced evaluation is limited, and MRCP or  contrast-enhanced CT abdomen and pelvis is recommended for further evaluation.   Past Medical History:  Diagnosis Date   Anemia, chronic disease 07/01/2015   Angina decubitus    Benign essential hypertension 10/31/2008   Qualifier: Diagnosis of  By: Lavona, MD, CODY Agent     CAD (coronary artery disease)    non obstructive. Left main normal. LAd proximal long 25% stenosis, termingating as focal 50% prox to mid lesion. First & second diag were small, normal. Circumflex in proximal av groove had luminal irregularities.. Was large mid obtuse marg which was branching, scattered luminal irregul. Infer branch did supply some septal perforators. Was PDA,small & normal.  < 1.41mm. There was prox 95% stenosis   CARDIOMYOPATHY 11/27/2008   CKD (chronic kidney disease)  Hyperlipidemia    transient history, controlled on diet   Hypertension    Mixed hyperlipidemia 10/30/2008   Qualifier: Diagnosis of  By: Leana, CNA, [name]     Non-traumatic rhabdomyolysis 07/03/2015   Polyarthropathy 07/01/2015   Primary osteoarthritis of knees, bilateral 12/10/2014   Pyogenic arthritis of left knee joint (HCC)    Swelling of left knee joint 07/01/2015   Systolic HF (heart failure) (HCC) 02/08/2018    Past Surgical History:  Procedure Laterality Date   CORONARY ARTERY BYPASS GRAFT N/A 02/17/2018   Procedure: CORONARY ARTERY BYPASS GRAFTING (CABG);  Surgeon: Lucas Dorise POUR, MD;  Location: [facility] OR;  Service: Open Heart Surgery;  Laterality: N/A;  Times 3 using endoscopically harvested right saphenous vein.     KNEE ARTHROSCOPY Left 07/01/2015   Procedure: Irrigation and debridement left knee arthroscopy;   Surgeon: Evalene JONETTA Chancy, MD;  Location: [facility] OR;  Service: Orthopedics;  Laterality: Left;   RIGHT/LEFT HEART CATH AND CORONARY ANGIOGRAPHY N/A 02/08/2018   Procedure: RIGHT/LEFT HEART CATH AND CORONARY ANGIOGRAPHY;  Surgeon: Dann Candyce RAMAN, MD;  Location: [facility] INVASIVE CV LAB;  Service: Cardiovascular;  Laterality: N/A;   TEE WITHOUT CARDIOVERSION N/A 02/17/2018   Procedure: TRANSESOPHAGEAL ECHOCARDIOGRAM (TEE);  Surgeon: Lucas Dorise POUR, MD;  Location: [facility] OR;  Service: Open Heart Surgery;  Laterality: N/A;    Prior to Admission medications  Medication Sig Start Date End Date Taking? Authorizing Provider  aspirin  81 MG EC tablet Take 1 tablet (81 mg total) by mouth daily. 07/28/19  Yes Meng, Hao, PA  carvedilol  (COREG ) 6.25 MG tablet Take 0.5 tablets (3.125 mg total) by mouth 2 (two) times daily with a meal. 05/28/20  Yes [name], MD  Cholecalciferol (VITAMIN D3) 1000 units CAPS Take 1,000 Units by mouth daily.   Yes [provider]  diclofenac  Sodium (VOLTAREN) 1 % GEL 2 g 4 (four) times daily as needed (for pain- left knee and any other affected area). 03/08/23  Yes [provider]  ezetimibe  (ZETIA ) 10 MG tablet Take 10 mg by mouth daily.   Yes [provider]  Ferrous Gluconate  324 (37.5 Fe) MG TABS TAKE 1 TABLET BY MOUTH TWICE DAILY WITH MEALS Patient taking differently: Take 324 mg by mouth See admin instructions. Take 324 mg by mouth with food on Mon/Wed/Fri 02/08/18  Yes Shadad, Firas N, MD  KETOTIFEN FUMARATE OP Place 1 drop into both eyes See admin instructions. Ketotifen 0.025% eye drops - Instill 1 drop into both eyes one to two times a day   Yes [provider]  latanoprost  (XALATAN ) 0.005 % ophthalmic solution Place 1 drop into both eyes at bedtime. 01/22/17  Yes [provider]  losartan  (COZAAR ) 50 MG tablet Take 50 mg by mouth 2 (two) times daily.   Yes [provider]  Nutritional Supplements (SUPLENA/CARB STEADY)  LIQD Take 237 mLs by mouth See admin instructions. Drink 237 ml's of Vanilla Suplena by mouth three times a day   Yes [provider]  rosuvastatin  (CRESTOR ) 40 MG tablet TAKE 1 TABLET BY MOUTH ONCE DAILY AT  6PM Patient taking differently: Take 40 mg by mouth at bedtime. 11/07/18  Yes [name], MD  traZODone  (DESYREL ) 150 MG tablet Take 150 mg by mouth at bedtime as needed for sleep.    Yes [provider]  trolamine salicylate (ASPERCREME) 10 % cream Apply 1 Application topically as needed (for left knee pain and/or other affected areas).   Yes [provider]  ezetimibe  (ZETIA ) 10 MG tablet Take 1 tablet (10 mg total) by mouth daily. Patient not taking: Reported on 03/15/2024 12/05/18 03/15/24  Rolan Ezra RAMAN, MD  oxyCODONE -acetaminophen  (PERCOCET/ROXICET) 5-325 MG tablet Take 1 tablet by mouth every 6 (six) hours as needed for severe pain (pain score 7-10). Patient not taking: Reported on 03/15/2024 03/09/23   Henderly, Britni A, PA-C  spironolactone  (ALDACTONE ) 25 MG tablet Take 1 tablet (25 mg total) by mouth daily. Patient not taking: Reported on 03/15/2024 12/06/18 03/15/24  Rolan Ezra RAMAN, MD    Current Facility-Administered Medications  Medication Dose Route Frequency Provider Last Rate Last Admin   0.45 % sodium chloride  infusion   Intravenous Continuous Krishnan, Gokul, MD 75 mL/hr at 03/15/24 1843 New Bag at 03/15/24 1843   acetaminophen  (TYLENOL ) tablet 650 mg  650 mg Oral Q6H PRN Krishnan, Gokul, MD   650 mg at 03/15/24 2104-04-06   Or   acetaminophen  (TYLENOL ) suppository 650 mg  650 mg Rectal Q6H PRN Verdene Purchase, MD       latanoprost  (XALATAN ) 0.005 % ophthalmic solution 1 drop  1 drop Both Eyes Daily Verdene Purchase, MD   1 drop at 03/15/24 2300   morphine  (PF) 2 MG/ML injection 1 mg  1 mg Intravenous Q4H PRN Sundil, Subrina, MD       ondansetron  (ZOFRAN ) injection 4 mg  4 mg Intravenous Q6H PRN Sundil, Subrina, MD       piperacillin -tazobactam (ZOSYN )  IVPB 2.25 g  2.25 g Intravenous Q8H Nicholaus Quarry, RPH 100 mL/hr at 03/16/24 0442 2.25 g at 03/16/24 0442   senna (SENOKOT) tablet 8.6 mg  1 tablet Oral BID Krishnan, Gokul, MD   8.6 mg at 03/15/24 2300    Allergies as of 03/14/2024 - Review Complete 03/14/2024  Allergen Reaction Noted   Ace inhibitors Swelling 10/08/2008   Entresto  [sacubitril-valsartan] Swelling 01/18/2019    Family History  Problem Relation Age of Onset   Diabetes Mother    Arthritis Mother    Heart disease Mother    Hyperlipidemia Mother    Hypertension Mother    Heart attack Mother 58   Heart disease Brother        had in his 96s. Had CABG x2   Hypothyroidism Daughter    Stroke Daughter     Social History   Socioeconomic History   Marital status: Widowed    Spouse name: Not on file   Number of children: Not on file   Years of education: Not on file   Highest education level: Not on file  Occupational History   Not on file  Tobacco Use   Smoking status: Never   Smokeless tobacco: Never  Vaping Use   Vaping status: Never Used  Substance and Sexual Activity   Alcohol use: Yes    Comment: one glass of wine or beer occassionaly   Drug use: No   Sexual activity: Yes  Other Topics Concern   Not on file  Social History Narrative   Lives with grandson (44 yo), wife passed on Apr 06, 2000. 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    Enjoys bowling- Is in a bowling league    02/11/2018 @ 1352 Pt discharged home with niece. No s/s of distress noted. Education completed, discharge instructions provided and understanding verbalized. Iv sites removed *2. Occlusive drsg per procedure-cdi. No c/o pain/nor distress.   Social Drivers of Health   Tobacco Use: Low Risk (03/14/2024)  Patient History    Smoking Tobacco Use: Never    Smokeless Tobacco Use: Never    Passive Exposure: Not on file  Financial Resource Strain: Low Risk (04/01/2023)   Overall Financial  Resource Strain (CARDIA)    Difficulty of Paying Living Expenses: Not hard at all  Food Insecurity: No Food Insecurity (03/15/2024)   Epic    Worried About Programme Researcher, Broadcasting/film/video in the Last Year: Never true    Ran Out of Food in the Last Year: Never true  Transportation Needs: Unmet Transportation Needs (03/15/2024)   Epic    Lack of Transportation (Medical): Yes    Lack of Transportation (Non-Medical): No  Physical Activity: Sufficiently Active (04/01/2023)   Exercise Vital Sign    Days of Exercise per Week: 5 days    Minutes of Exercise per Session: 30 min  Stress: No Stress Concern Present (04/01/2023)   [employer] of Occupational Health - Occupational Stress Questionnaire    Feeling of Stress : Not at all  Social Connections: Moderately Integrated (03/15/2024)   Social Connection and Isolation Panel    Frequency of Communication with Friends and Family: More than three times a week    Frequency of Social Gatherings with Friends and Family: More than three times a week    Attends Religious Services: More than 4 times per year    Active Member of [employer] or Organizations: Yes    Attends Banker Meetings: 1 to 4 times per year    Marital Status: Widowed  Intimate Partner Violence: Not At Risk (03/15/2024)   Epic    Fear of Current or Ex-Partner: No    Emotionally Abused: No    Physically Abused: No    Sexually Abused: No  Depression (PHQ2-9): Low Risk (04/01/2023)   Depression (PHQ2-9)    PHQ-2 Score: 0  Alcohol Screen: Not on file  Housing: Low Risk (03/15/2024)   Epic    Unable to Pay for Housing in the Last Year: No    Number of Times Moved in the Last Year: 0    Homeless in the Last Year: No  Utilities: Not At Risk (03/15/2024)   Epic    Threatened with loss of utilities: No  Health Literacy: Adequate Health Literacy (04/01/2023)   B1300 Health Literacy    Frequency of need for help with medical instructions: Never   Review of Systems: Gen: Denies fever, sweats or  chills. No weight loss.  CV: Denies chest pain, palpitations or edema. Resp: Denies cough, shortness of breath of hemoptysis.  GI: See HPI.   GU: Urine has been a little darker over the past few days which is clearing. MS: Denies joint pain, muscles aches or weakness. Derm: Denies rash, itchiness, skin lesions or unhealing ulcers. Psych: Denies depression, anxiety, memory loss or confusion. Heme: Denies easy bruising, bleeding. Neuro: + Dizziness.  Endo:  Denies any problems with DM, thyroid  or adrenal function.  Physical Exam: Vital signs in last 24 hours: Temp:  [98.3 F (36.8 C)-99.1 F (37.3 C)] 99.1 F (37.3 C) (01/08 0427) Pulse Rate:  [62-84] 62 (01/08 0427) Resp:  [13-18] 16 (01/08 0427) BP: (103-129)/(49-74) 112/55 (01/08 0427) SpO2:  [95 %-100 %] 100 % (01/08 0427) Weight:  [60.1 kg-65.2 kg] 60.1 kg (01/07 1530) Last BM Date : 03/15/24 General:  Alert 84 year old male in no acute distress. Head:  Normocephalic and atraumatic. Eyes: Scleral icterus present.  Conjunctiva pink. Ears:  Normal auditory acuity. Nose:  No deformity, discharge or  lesions. Mouth: Upper and lower dentures intact.  No ulcers or lesions.  Neck:  Supple. No lymphadenopathy or thyromegaly.  Lungs: Breath sounds clear throughout. No wheezes, rhonchi or crackles.  Heart: Regular rate and rhythm, no murmurs. Abdomen: Soft, nondistended.  Nontender.  Positive bowel sounds to all 4 quadrants.  No palpable mass. Rectal: Deferred. Musculoskeletal:  Symmetrical without gross deformities.  Pulses:  Normal pulses noted. Extremities:  Without clubbing or edema. Neurologic:  Alert and  oriented x 4. No focal deficits.  Skin:  Intact without significant lesions or rashes. Psych:  Alert and cooperative. Normal mood and affect.  Intake/Output from previous day: 01/07 0701 - 01/08 0700 In: 711.7 [P.O.:240; I.V.:321.7; IV Piggyback:150] Out: 300 [Urine:300] Intake/Output this shift: No intake/output data  recorded.  Lab Results: Recent Labs    03/14/24 1730 03/16/24 0447  WBC 6.1 5.7  HGB 9.2* 8.4*  HCT 26.0* 23.1*  PLT 258 249   BMET Recent Labs    03/14/24 1730 03/15/24 0947 03/16/24 0447  NA 137 139 139  K 4.5 4.3 4.0  CL 103 109 108  CO2 21* 18* 20*  GLUCOSE 147* 127* 124*  BUN 27* 25* 22  CREATININE 2.57* 2.75* 3.20*  CALCIUM  9.7 8.9 9.0   LFT Recent Labs    03/16/24 0447  PROT 5.7*  ALBUMIN  2.7*  AST 255*  ALT 236*  ALKPHOS 1,691*  BILITOT 7.4*   PT/INR Recent Labs    03/14/24 1730 03/16/24 0447  LABPROT 13.3 14.7  INR 1.0 1.1   Hepatitis Panel No results for input(s): HEPBSAG, HCVAB, HEPAIGM, HEPBIGM in the last 72 hours.    Studies/Results: MR ABDOMEN MRCP W WO CONTRAST Result Date: 03/15/2024 EXAM: MRCP WITH AND WITHOUT IV CONTRAST 03/15/2024 04:41:16 PM TECHNIQUE: Multisequence, multiplanar magnetic resonance images of the abdomen with and without intravenous contrast. MRCP sequences were performed. Acquisition workstation 3D images were acquired by the technologist using existing protocol; radiologist available for consultation but did not designate specific images to acquire in this individual case. COMPARISON: CT abdomen 03/14/2024. CLINICAL HISTORY: Biliary ductal dilation and transaminitis. Suspected pancreatic head mass on recent CT scan. FINDINGS: LIVER: 8 mm focus of restricted diffusion just above the gallbladder fossa on image 24 series 8, not well corroborated on other imaging sequences, possibly artifact although a small subtle metastatic lesion cannot be totally excluded . GALLBLADDER AND BILIARY SYSTEM: Severe intrahepatic biliary dilatation and common bile duct dilatation up to 1.5 cm extending to a mass in the head of the pancreas. Mildly distended gallbladder with mild gallbladder wall thickening; strictly speaking, cholecystitis is not excluded. SPLEEN: Unremarkable. PANCREAS/PANCREATIC DUCT: Indistinctly marginated 3.3 x 2.3 x  2.1 cm mass in the head of the pancreas demonstrating relative isoenhancement on early arterial phase images and mildly accentuated enhancement on delayed images and also demonstrating substantial restriction of diffusion as on image 28 series 8. The dorsopancreatic duct is likewise prominently dilated extending into this mass. I don't see definite involvement of the celiac trunk or superior mesenteric artery. No definite invasion or encirclement of the portal vein or superior mesenteric vein (SMV). ADRENAL GLANDS: Unremarkable. KIDNEYS: Unremarkable. LYMPH NODES: No enlarged abdominal lymph nodes. VASCULATURE: No definite involvement of the celiac trunk or superior mesenteric artery. No definite invasion or encirclement of the portal vein or superior mesenteric vein (SMV). PERITONEUM: No ascites. ABDOMINAL WALL: Left lumbar hernia on image 57 series 19 contains adipose tissue. No mass. BOWEL: Grossly unremarkable. No bowel obstruction. BONES: No acute abnormality or worrisome  osseous lesion. SOFT TISSUES: Prior median sternotomy. Motion artifact is present, reducing diagnostic sensitivity and specificity. MISCELLANEOUS: Unremarkable. IMPRESSION: 1. 3.3 x 2.3 x 2.1 cm pancreatic head mass associated with severe dorsal pancreatic duct and biliary dilatation , high suspicion for pancreatic adenocarcinoma. Tissue diagnosis recommended. Although we are limited by spatial resolution and motion artifact, no definite involvement observed of the celiac trunk, superior mesenteric artery, portal vein, or superior mesenteric vein. The mass is very indistinctly marginated and poorly visible on most sequences but is more conspicuous on high b-value diffusion-weighted images. 2. 8 mm in diameter focus of restricted diffusion in the liver just above the gallbladder fossa on image 24, series 8, nonspecific, although small metastatic lesion is difficult to exclude . 3. Mild gallbladder distention and mild gallbladder wall  thickening; cholecystitis is not excluded. 4. Chronic and incidental findings include prior median sternotomy and a left lumbar hernia containing adipose tissue. Electronically signed by: Ryan Salvage MD MD 03/15/2024 05:11 PM EST RP Workstation: [id number]   MR 3D Recon At Scanner Result Date: 03/15/2024 EXAM: MRCP WITH AND WITHOUT IV CONTRAST 03/15/2024 04:41:16 PM TECHNIQUE: Multisequence, multiplanar magnetic resonance images of the abdomen with and without intravenous contrast. MRCP sequences were performed. Acquisition workstation 3D images were acquired by the technologist using existing protocol; radiologist available for consultation but did not designate specific images to acquire in this individual case. COMPARISON: CT abdomen 03/14/2024. CLINICAL HISTORY: Biliary ductal dilation and transaminitis. Suspected pancreatic head mass on recent CT scan. FINDINGS: LIVER: 8 mm focus of restricted diffusion just above the gallbladder fossa on image 24 series 8, not well corroborated on other imaging sequences, possibly artifact although a small subtle metastatic lesion cannot be totally excluded . GALLBLADDER AND BILIARY SYSTEM: Severe intrahepatic biliary dilatation and common bile duct dilatation up to 1.5 cm extending to a mass in the head of the pancreas. Mildly distended gallbladder with mild gallbladder wall thickening; strictly speaking, cholecystitis is not excluded. SPLEEN: Unremarkable. PANCREAS/PANCREATIC DUCT: Indistinctly marginated 3.3 x 2.3 x 2.1 cm mass in the head of the pancreas demonstrating relative isoenhancement on early arterial phase images and mildly accentuated enhancement on delayed images and also demonstrating substantial restriction of diffusion as on image 28 series 8. The dorsopancreatic duct is likewise prominently dilated extending into this mass. I don't see definite involvement of the celiac trunk or superior mesenteric artery. No definite invasion or encirclement of the  portal vein or superior mesenteric vein (SMV). ADRENAL GLANDS: Unremarkable. KIDNEYS: Unremarkable. LYMPH NODES: No enlarged abdominal lymph nodes. VASCULATURE: No definite involvement of the celiac trunk or superior mesenteric artery. No definite invasion or encirclement of the portal vein or superior mesenteric vein (SMV). PERITONEUM: No ascites. ABDOMINAL WALL: Left lumbar hernia on image 57 series 19 contains adipose tissue. No mass. BOWEL: Grossly unremarkable. No bowel obstruction. BONES: No acute abnormality or worrisome osseous lesion. SOFT TISSUES: Prior median sternotomy. Motion artifact is present, reducing diagnostic sensitivity and specificity. MISCELLANEOUS: Unremarkable. IMPRESSION: 1. 3.3 x 2.3 x 2.1 cm pancreatic head mass associated with severe dorsal pancreatic duct and biliary dilatation , high suspicion for pancreatic adenocarcinoma. Tissue diagnosis recommended. Although we are limited by spatial resolution and motion artifact, no definite involvement observed of the celiac trunk, superior mesenteric artery, portal vein, or superior mesenteric vein. The mass is very indistinctly marginated and poorly visible on most sequences but is more conspicuous on high b-value diffusion-weighted images. 2. 8 mm in diameter focus of restricted diffusion in the liver just above the  gallbladder fossa on image 24, series 8, nonspecific, although small metastatic lesion is difficult to exclude . 3. Mild gallbladder distention and mild gallbladder wall thickening; cholecystitis is not excluded. 4. Chronic and incidental findings include prior median sternotomy and a left lumbar hernia containing adipose tissue. Electronically signed by: Ryan Salvage MD MD 03/15/2024 05:11 PM EST RP Workstation: [id number]   CT ABDOMEN PELVIS WO CONTRAST Result Date: 03/14/2024 EXAM: CT ABDOMEN AND PELVIS WITHOUT CONTRAST 03/14/2024 06:52:08 PM TECHNIQUE: CT of the abdomen and pelvis was performed without the  administration of intravenous contrast. Multiplanar reformatted images are provided for review. Automated exposure control, iterative reconstruction, and/or weight-based adjustment of the mA/kV was utilized to reduce the radiation dose to as low as reasonably achievable. COMPARISON: None available. CLINICAL HISTORY: Abdominal pain, acute, nonlocalized; Hypotensive, elevated liver/bilirubin. Dizziness. Weight loss. FINDINGS: LOWER CHEST: Sternotomy wires. Mild dependent changes in the lung bases. LIVER: Diffuse intra- and extrahepatic bile duct dilatation. GALLBLADDER AND BILE DUCTS: The gallbladder is mildly distended. No gallstones are identified. Diffuse intra- and extrahepatic bile duct dilatation. SPLEEN: The spleen is normal. No nodules. PANCREAS: Pancreatic ductal dilatation with fullness in the head of the pancreas. Unenhanced evaluation is limited, but this likely represents a pancreatic mass measuring about 2.5 cm in diameter. Correlate with MRCP or contrast-enhanced CT abdomen and pelvis for further evaluation. ADRENAL GLANDS: No acute abnormality. KIDNEYS, URETERS AND BLADDER: Kidneys are normal. No stones in the kidneys or ureters. No hydronephrosis. No perinephric or periureteral stranding. The bladder wall is mildly thickened, likely due to outlet obstruction. GI AND BOWEL: Stomach, small bowel, and colon are not abnormally distended. No discrete wall thickening is appreciated. There is no bowel obstruction. Small left inguinal hernia containing small bowel without proximal obstruction. PERITONEUM AND RETROPERITONEUM: No ascites. No free air. VASCULATURE: Aorta is normal in caliber. Calcification of the aorta. No aneurysm. LYMPH NODES: No lymphadenopathy. REPRODUCTIVE ORGANS: The prostate gland is enlarged. Measuring 6.5 cm in diameter. BONES AND SOFT TISSUES: Degenerative changes in the spine. No focal bone lesions. Left lumbar triangle abdominal wall hernia containing fat. IMPRESSION: 1. Fullness in  the head of the pancreas with associated pancreatic and bile duct dilatation, likely representing a pancreatic adenocarcinoma measuring about 2.5 cm in diameter; unenhanced evaluation is limited, and MRCP or contrast-enhanced CT abdomen and pelvis is recommended for further evaluation. Electronically signed by: Elsie Gravely MD 03/14/2024 07:22 PM EST RP Workstation: [id number]    IMPRESSION/PLAN:  84 year old male admitted 03/15/2024 for further evaluation regarding weight loss, elevated LFTs, obstructive jaundice with associated pancreatic mass, biliary and pancreatic duct dilatation concerning for pancreatic adenocarcinoma. CTAP showed fullness in the head of the pancreas  measuring 2.5 cm in diameter with associated pancreatic and bile duct dilatation. Abdominal MRI/MRCP W/WO contrast identified a 3.3 x 2.3 x 2.1 cm pancreatic head mass associated with several dorsal pancreatic duct and biliary dilatation, mild gallbladder distention and mild gallbladder wall thickening. T. Bili 8.2 -> 7.4. Alk phos 1,876 -> 1,691. AST 222 -> 255. ALT 285 -> 236.  On Zosyn  IV. No leukocytosis.  Temp 99.24F. Hemodynamically stable. - Clear liquid diet - NPO after midnight - ERCP with Dr. Abran Friday 03/17/2024 benefits and risks discussed including risk with sedation, risk of bleeding, perforation, pancreatitis and infection  - Await Ca 19-9 results - CBC, BMP and hepatic panel in am - Await further recommendations per Dr.Shayona Hibbitts  MGUS with normocytic anemia. Hg 9.2 (Hg 11.2 two months ago) -> 8.4. Iron  68. TIBC 213.  On oral iron  at home.  No overt GI bleeding.  - Transfuse for Hg level < 8   AKI on CKD. Cr 2.57 -> 2.75 -> 3.20.  IVFs at 75 cc/hr. - Management per the hospitalist  - Recommend renal consult   Coronary artery disease s/p 3 vessel CABG 02/2018.  No angina.  Nonischemic cardiomyopathy. LV EF 50 - 55% per ECHO 05/2021.      Elida HERO Kennedy-Smith  03/16/2024, 9:08 AM  GI ATTENDING  History,  laboratories, x-rays all personally reviewed.  Patient seen and examined as outlined above.  Agree with comprehensive consultation note as outlined above.  Total time of 75 minutes was spent in the comprehensive evaluation of this patient.  The level of clinical decision making was deemed highest.  84 year old presents with painless jaundice and weight loss.  Imaging suggest adenocarcinoma the head of the pancreas with associated obstruction.  Elevated liver tests and jaundice secondary to obstruction.  Multiple general medical problems as noted above.  The patient will be for ERCP with sphincterotomy and stent placement.  The procedure is high risk as is the patient given high risk given his age and comorbidities.  Currently, the procedure is tentatively scheduled for tomorrow late morning/early afternoon.  Norleen SAILOR. Abran Raddle., M.D. [facility] Division of Gastroenterology      "

## 2024-03-16 NOTE — Progress Notes (Signed)
 " PROGRESS NOTE    Luis Chen  FMW:[id number] DOB: Jan 29, 1941 DOA: 03/14/2024 PCP: [name], MD  Subjective: Patient reports feeling okay, denied any abdominal pain. Has a headache but attributes it not eating well in the last few days     Hospital Course: 84 year old male with PMH of CKD stage III, CAD, MGUS, HLD who presented with lightheadedness and low blood pressure, and weight loss over the last few weeks.  Blood work in the ED showed elevated LFTs, significantly elevated alkaline phosphatase and bilirubin.  CT abdomen/pelvis showed concern for pancreatic mass and patient was hospitalized for further management   Assessment and Plan:  Obstructive jaundice due to pancreatic head mass - MRCP showed pancreatic head mass associated with severe dorsal pancreatic duct and biliary dilation - GI following and plan for ERCP tomorrow  - clear today, n.p.o. after midnight tomorrow - f/u CA 19-9 levels  - monitor CMP daily   AKI on chronic CKD - baseline Cr is around 1.5-1.8, presented with creatinine of 2.57 which is trending up to 3.20 today. CT without any hydronephrosis - continue IVF fluids - strict I/Os, has made 225 mL of urine today, but unclear how accurate that is  - check urine lytes - will consult nephrology   Normocytic anemia - has h/o MGUS, no overt bleeding - Hb trending down to 8.4, monitor - transfuse for Hb<7  CAD - holding antiplatelets for now in anticipation of ERCP tomorrow  Glaucoma - continue eyedrops      DVT prophylaxis: SCDs Start: 03/15/24 1637    Code Status: Full Code Family Communication: Updated at bedside Disposition Plan: TBD Reason for continuing need for hospitalization: ERCP  Objective: Vitals:   03/15/24 2340 03/16/24 0427 03/16/24 0923 03/16/24 1418  BP: (!) 126/52 (!) 112/55 124/61 116/60  Pulse: 66 62 60 65  Resp: 16 16 16 20   Temp: 98.5 F (36.9 C) 99.1 F (37.3 C) 97.7 F (36.5 C) 97.8 F (36.6 C)  TempSrc: Oral  Oral Oral Oral  SpO2: 97% 100% 100% 100%  Weight:      Height:        Intake/Output Summary (Last 24 hours) at 03/16/2024 1432 Last data filed at 03/16/2024 1214 Gross per 24 hour  Intake 1021.67 ml  Output 525 ml  Net 496.67 ml   Filed Weights   03/15/24 1000 03/15/24 1530  Weight: 65.2 kg 60.1 kg    Examination:  Physical Exam Vitals and nursing note reviewed.  Constitutional:      General: He is not in acute distress. Cardiovascular:     Rate and Rhythm: Normal rate.  Pulmonary:     Effort: No respiratory distress.  Abdominal:     General: There is no distension.     Tenderness: There is no abdominal tenderness.  Musculoskeletal:     Right lower leg: No edema.     Left lower leg: No edema.     Data Reviewed: I have personally reviewed following labs and imaging studies  CBC: Recent Labs  Lab 03/14/24 1730 03/16/24 0447  WBC 6.1 5.7  NEUTROABS 3.9  --   HGB 9.2* 8.4*  HCT 26.0* 23.1*  MCV 88.7 85.9  PLT 258 249   Basic Metabolic Panel: Recent Labs  Lab 03/14/24 1730 03/15/24 0947 03/16/24 0447  NA 137 139 139  K 4.5 4.3 4.0  CL 103 109 108  CO2 21* 18* 20*  GLUCOSE 147* 127* 124*  BUN 27* 25* 22  CREATININE  2.57* 2.75* 3.20*  CALCIUM  9.7 8.9 9.0   GFR: Estimated Creatinine Clearance: 14.3 mL/min (A) (by C-G formula based on SCr of 3.2 mg/dL (H)). Liver Function Tests: Recent Labs  Lab 03/14/24 1730 03/16/24 0447  AST 222* 255*  ALT 285* 236*  ALKPHOS 1,876* 1,691*  BILITOT 8.2* 7.4*  PROT 6.7 5.7*  ALBUMIN  3.3* 2.7*   Recent Labs  Lab 03/14/24 1850  LIPASE 62*   No results for input(s): AMMONIA in the last 168 hours. Coagulation Profile: Recent Labs  Lab 03/14/24 1730 03/16/24 0447  INR 1.0 1.1   Cardiac Enzymes: No results for input(s): CKTOTAL, CKMB, CKMBINDEX, TROPONINI in the last 168 hours. ProBNP, BNP (last 5 results) No results for input(s): PROBNP, BNP in the last 8760 hours. HbA1C: No results for  input(s): HGBA1C in the last 72 hours. CBG: No results for input(s): GLUCAP in the last 168 hours. Lipid Profile: No results for input(s): CHOL, HDL, LDLCALC, TRIG, CHOLHDL, LDLDIRECT in the last 72 hours. Thyroid  Function Tests: Recent Labs    03/16/24 0447  TSH 0.838   Anemia Panel: Recent Labs    03/16/24 0447  VITAMINB12 2,605*  FOLATE 15.6  FERRITIN 2,730*  TIBC 213*  IRON  68  RETICCTPCT 1.4   Sepsis Labs: Recent Labs  Lab 03/14/24 1730  LATICACIDVEN 1.2    Recent Results (from the past 240 hours)  Culture, blood (routine x 2)     Status: None (Preliminary result)   Collection Time: 03/14/24  5:33 PM   Specimen: BLOOD  Result Value Ref Range Status   Specimen Description   Final    BLOOD LEFT ANTECUBITAL Performed at [facility], [address] [place], [place] [address]    Special Requests   Final    BOTTLES DRAWN AEROBIC AND ANAEROBIC Blood Culture adequate volume Performed at [facility], [address], [place], [place] [address]    Culture   Final    NO GROWTH 2 DAYS Performed at [facility], [address], [place], [place] [address]    Report Status PENDING  Incomplete  Culture, blood (routine x 2)     Status: None (Preliminary result)   Collection Time: 03/14/24  5:50 PM   Specimen: BLOOD RIGHT ARM  Result Value Ref Range Status   Specimen Description   Final    BLOOD RIGHT ARM Performed at [facility], [address], [place], [place] [address]    Special Requests   Final    BOTTLES DRAWN AEROBIC ONLY Blood Culture adequate volume Performed at [facility], [address], [place], [place] [address]    Culture   Final    NO GROWTH 2 DAYS Performed at [facility], [address], [place], [place] [address]    Report Status PENDING  Incomplete     Radiology Studies: MR ABDOMEN MRCP W WO CONTRAST Result Date: 03/15/2024 EXAM: MRCP WITH AND  WITHOUT IV CONTRAST 03/15/2024 04:41:16 PM TECHNIQUE: Multisequence, multiplanar magnetic resonance images of the abdomen with and without intravenous contrast. MRCP sequences were performed. Acquisition workstation [id number] images were acquired by the technologist using existing protocol; radiologist available for consultation but did not designate specific images to acquire in this individual case. COMPARISON: CT abdomen 03/14/2024. CLINICAL HISTORY: Biliary ductal dilation and transaminitis. Suspected pancreatic head mass on recent CT scan. FINDINGS: LIVER: 8 mm focus of restricted diffusion just above the gallbladder fossa on image 24 series 8, not well corroborated on  other imaging sequences, possibly artifact although a small subtle metastatic lesion cannot be totally excluded . GALLBLADDER AND BILIARY SYSTEM: Severe intrahepatic biliary dilatation and common bile duct dilatation up to 1.5 cm extending to a mass in the head of the pancreas. Mildly distended gallbladder with mild gallbladder wall thickening; strictly speaking, cholecystitis is not excluded. SPLEEN: Unremarkable. PANCREAS/PANCREATIC DUCT: Indistinctly marginated 3.3 x 2.3 x 2.1 cm mass in the head of the pancreas demonstrating relative isoenhancement on early arterial phase images and mildly accentuated enhancement on delayed images and also demonstrating substantial restriction of diffusion as on image 28 series 8. The dorsopancreatic duct is likewise prominently dilated extending into this mass. I don't see definite involvement of the celiac trunk or superior mesenteric artery. No definite invasion or encirclement of the portal vein or superior mesenteric vein (SMV). ADRENAL GLANDS: Unremarkable. KIDNEYS: Unremarkable. LYMPH NODES: No enlarged abdominal lymph nodes. VASCULATURE: No definite involvement of the celiac trunk or superior mesenteric artery. No definite invasion or encirclement of the portal vein or superior mesenteric vein (SMV).  PERITONEUM: No ascites. ABDOMINAL WALL: Left lumbar hernia on image 57 series 19 contains adipose tissue. No mass. BOWEL: Grossly unremarkable. No bowel obstruction. BONES: No acute abnormality or worrisome osseous lesion. SOFT TISSUES: Prior median sternotomy. Motion artifact is present, reducing diagnostic sensitivity and specificity. MISCELLANEOUS: Unremarkable. IMPRESSION: 1. 3.3 x 2.3 x 2.1 cm pancreatic head mass associated with severe dorsal pancreatic duct and biliary dilatation , high suspicion for pancreatic adenocarcinoma. Tissue diagnosis recommended. Although we are limited by spatial resolution and motion artifact, no definite involvement observed of the celiac trunk, superior mesenteric artery, portal vein, or superior mesenteric vein. The mass is very indistinctly marginated and poorly visible on most sequences but is more conspicuous on high b-value diffusion-weighted images. 2. 8 mm in diameter focus of restricted diffusion in the liver just above the gallbladder fossa on image 24, series 8, nonspecific, although small metastatic lesion is difficult to exclude . 3. Mild gallbladder distention and mild gallbladder wall thickening; cholecystitis is not excluded. 4. Chronic and incidental findings include prior median sternotomy and a left lumbar hernia containing adipose tissue. Electronically signed by: Ryan Salvage MD MD 03/15/2024 05:11 PM EST RP Workstation: [id number]   MR 3D Recon At Scanner Result Date: 03/15/2024 EXAM: MRCP WITH AND WITHOUT IV CONTRAST 03/15/2024 04:41:16 PM TECHNIQUE: Multisequence, multiplanar magnetic resonance images of the abdomen with and without intravenous contrast. MRCP sequences were performed. Acquisition workstation 3D images were acquired by the technologist using existing protocol; radiologist available for consultation but did not designate specific images to acquire in this individual case. COMPARISON: CT abdomen 03/14/2024. CLINICAL HISTORY: Biliary  ductal dilation and transaminitis. Suspected pancreatic head mass on recent CT scan. FINDINGS: LIVER: 8 mm focus of restricted diffusion just above the gallbladder fossa on image 24 series 8, not well corroborated on other imaging sequences, possibly artifact although a small subtle metastatic lesion cannot be totally excluded . GALLBLADDER AND BILIARY SYSTEM: Severe intrahepatic biliary dilatation and common bile duct dilatation up to 1.5 cm extending to a mass in the head of the pancreas. Mildly distended gallbladder with mild gallbladder wall thickening; strictly speaking, cholecystitis is not excluded. SPLEEN: Unremarkable. PANCREAS/PANCREATIC DUCT: Indistinctly marginated 3.3 x 2.3 x 2.1 cm mass in the head of the pancreas demonstrating relative isoenhancement on early arterial phase images and mildly accentuated enhancement on delayed images and also demonstrating substantial restriction of diffusion as on image 28 series 8. The dorsopancreatic duct is likewise prominently  dilated extending into this mass. I don't see definite involvement of the celiac trunk or superior mesenteric artery. No definite invasion or encirclement of the portal vein or superior mesenteric vein (SMV). ADRENAL GLANDS: Unremarkable. KIDNEYS: Unremarkable. LYMPH NODES: No enlarged abdominal lymph nodes. VASCULATURE: No definite involvement of the celiac trunk or superior mesenteric artery. No definite invasion or encirclement of the portal vein or superior mesenteric vein (SMV). PERITONEUM: No ascites. ABDOMINAL WALL: Left lumbar hernia on image 57 series 19 contains adipose tissue. No mass. BOWEL: Grossly unremarkable. No bowel obstruction. BONES: No acute abnormality or worrisome osseous lesion. SOFT TISSUES: Prior median sternotomy. Motion artifact is present, reducing diagnostic sensitivity and specificity. MISCELLANEOUS: Unremarkable. IMPRESSION: 1. 3.3 x 2.3 x 2.1 cm pancreatic head mass associated with severe dorsal pancreatic  duct and biliary dilatation , high suspicion for pancreatic adenocarcinoma. Tissue diagnosis recommended. Although we are limited by spatial resolution and motion artifact, no definite involvement observed of the celiac trunk, superior mesenteric artery, portal vein, or superior mesenteric vein. The mass is very indistinctly marginated and poorly visible on most sequences but is more conspicuous on high b-value diffusion-weighted images. 2. 8 mm in diameter focus of restricted diffusion in the liver just above the gallbladder fossa on image 24, series 8, nonspecific, although small metastatic lesion is difficult to exclude . 3. Mild gallbladder distention and mild gallbladder wall thickening; cholecystitis is not excluded. 4. Chronic and incidental findings include prior median sternotomy and a left lumbar hernia containing adipose tissue. Electronically signed by: Ryan Salvage MD MD 03/15/2024 05:11 PM EST RP Workstation: [id number]   CT ABDOMEN PELVIS WO CONTRAST Result Date: 03/14/2024 EXAM: CT ABDOMEN AND PELVIS WITHOUT CONTRAST 03/14/2024 06:52:08 PM TECHNIQUE: CT of the abdomen and pelvis was performed without the administration of intravenous contrast. Multiplanar reformatted images are provided for review. Automated exposure control, iterative reconstruction, and/or weight-based adjustment of the mA/kV was utilized to reduce the radiation dose to as low as reasonably achievable. COMPARISON: None available. CLINICAL HISTORY: Abdominal pain, acute, nonlocalized; Hypotensive, elevated liver/bilirubin. Dizziness. Weight loss. FINDINGS: LOWER CHEST: Sternotomy wires. Mild dependent changes in the lung bases. LIVER: Diffuse intra- and extrahepatic bile duct dilatation. GALLBLADDER AND BILE DUCTS: The gallbladder is mildly distended. No gallstones are identified. Diffuse intra- and extrahepatic bile duct dilatation. SPLEEN: The spleen is normal. No nodules. PANCREAS: Pancreatic ductal dilatation with  fullness in the head of the pancreas. Unenhanced evaluation is limited, but this likely represents a pancreatic mass measuring about 2.5 cm in diameter. Correlate with MRCP or contrast-enhanced CT abdomen and pelvis for further evaluation. ADRENAL GLANDS: No acute abnormality. KIDNEYS, URETERS AND BLADDER: Kidneys are normal. No stones in the kidneys or ureters. No hydronephrosis. No perinephric or periureteral stranding. The bladder wall is mildly thickened, likely due to outlet obstruction. GI AND BOWEL: Stomach, small bowel, and colon are not abnormally distended. No discrete wall thickening is appreciated. There is no bowel obstruction. Small left inguinal hernia containing small bowel without proximal obstruction. PERITONEUM AND RETROPERITONEUM: No ascites. No free air. VASCULATURE: Aorta is normal in caliber. Calcification of the aorta. No aneurysm. LYMPH NODES: No lymphadenopathy. REPRODUCTIVE ORGANS: The prostate gland is enlarged. Measuring 6.5 cm in diameter. BONES AND SOFT TISSUES: Degenerative changes in the spine. No focal bone lesions. Left lumbar triangle abdominal wall hernia containing fat. IMPRESSION: 1. Fullness in the head of the pancreas with associated pancreatic and bile duct dilatation, likely representing a pancreatic adenocarcinoma measuring about 2.5 cm in diameter; unenhanced evaluation is limited,  and MRCP or contrast-enhanced CT abdomen and pelvis is recommended for further evaluation. Electronically signed by: Elsie Gravely MD 03/14/2024 07:22 PM EST RP Workstation: [id number]    Scheduled Meds:  latanoprost   1 drop Both Eyes Daily   senna  1 tablet Oral BID   Continuous Infusions:  sodium chloride  75 mL/hr at 03/15/24 1843   piperacillin -tazobactam (ZOSYN )  IV 2.25 g (03/16/24 1201)     LOS: 1 day   Time spent: 40 minutes  Casimer Dare, MD  [facility]  03/16/2024, 2:32 PM   "

## 2024-03-16 NOTE — Anesthesia Preprocedure Evaluation (Addendum)
 "                                  Anesthesia Evaluation  Patient identified by MRN, date of birth, ID band Patient awake    Reviewed: Allergy & Precautions, NPO status , Patient's Chart, lab work & pertinent test results  History of Anesthesia Complications Negative for: history of anesthetic complications  Airway Mallampati: II  TM Distance: >3 FB Neck ROM: Full    Dental no notable dental hx. (+) Upper Dentures, Missing, Partial Lower,    Pulmonary neg pulmonary ROS   Pulmonary exam normal breath sounds clear to auscultation       Cardiovascular hypertension, Pt. on medications and Pt. on home beta blockers + CAD and + CABG (2019)  Normal cardiovascular exam Rhythm:Regular Rate:Normal  06/2021 TTE  1. Left ventricular ejection fraction, by estimation, is 50 to 55%. The  left ventricle has low normal function. The left ventricle demonstrates  global hypokinesis. Left ventricular diastolic parameters were normal. The  average left ventricular global  longitudinal strain is -14.6 %. The global longitudinal strain is normal.   2. Right ventricular systolic function is normal. The right ventricular  size is normal. There is normal pulmonary artery systolic pressure.   3. Left atrial size was mildly dilated.   4. Right atrial size was mildly dilated.   5. The mitral valve is normal in structure. Mild mitral valve  regurgitation. No evidence of mitral stenosis.   6. Tricuspid valve regurgitation is mild to moderate.   7. The aortic valve is tricuspid. There is moderate calcification of the  aortic valve. There is moderate thickening of the aortic valve. Aortic  valve regurgitation is mild. Aortic valve sclerosis is present, with no  evidence of aortic valve stenosis.   8. Aortic dilatation noted. There is mild dilatation of the aortic root,  measuring 40 mm. There is mild dilatation of the ascending aorta,  measuring 39 mm.   9. The inferior vena cava is normal in  size with greater than 50%  respiratory variability, suggesting right atrial pressure of 3 mmHg.      Neuro/Psych  Neuromuscular disease    GI/Hepatic negative GI ROS, Neg liver ROS,,,  Endo/Other  negative endocrine ROS    Renal/GU CRFRenal disease Stage 3   Lab Results      Component                Value               Date                            K                        4.0                 03/16/2024                    CREATININE               3.20 (H)            03/16/2024                           Musculoskeletal  (+) Arthritis ,    Abdominal  Peds  Hematology  (+) Blood dyscrasia, anemia Lab Results      Component                Value               Date                      WBC                      5.7                 03/16/2024                HGB                      8.4 (L)             03/16/2024                HCT                      23.1 (L)            03/16/2024                MCV                      85.9                03/16/2024                PLT                      249                 03/16/2024              Anesthesia Other Findings All: ACE and Entresto   Reproductive/Obstetrics                              Anesthesia Physical Anesthesia Plan  ASA: 4  Anesthesia Plan: General   Post-op Pain Management: Minimal or no pain anticipated   Induction: Intravenous  PONV Risk Score and Plan: 3 and Treatment may vary due to age or medical condition, Ondansetron  and Dexamethasone   Airway Management Planned: Oral ETT  Additional Equipment: None  Intra-op Plan:   Post-operative Plan: Extubation in OR  Informed Consent: I have reviewed the patients History and Physical, chart, labs and discussed the procedure including the risks, benefits and alternatives for the proposed anesthesia with the patient or authorized representative who has indicated his/her understanding and acceptance.     Dental advisory  given  Plan Discussed with: CRNA and Surgeon  Anesthesia Plan Comments: (ERCP for Pancreatic  Headmass)         Anesthesia Quick Evaluation  "

## 2024-03-16 NOTE — TOC Initial Note (Signed)
 Transition of Care [facility]) - Initial/Assessment Note    Patient Details  Name: Luis Chen MRN: [id number] Date of Birth: May 23, 1940  Transition of Care [facility]) CM/SW Contact:    Tawni CHRISTELLA Eva, LCSW Phone Number: 03/16/2024, 3:45 PM  Clinical Narrative:                   CSW spoke with the pts son to discuss discharge planning. The pts son reported that the patient lives at home alone. The pts son reported no DME needs. CSW discussed SDOH concerns regarding transportation to medical appointments. The pts son stated that he and his sons provide transportation for the pt to his medical appointments. The pts son reports that he or his sister will provide transportation home upon discharge. ICM to follow.  Expected Discharge Plan: Home/Self Care Barriers to Discharge: Continued Medical Work up   Patient Goals and CMS Choice Patient states their goals for this hospitalization and ongoing recovery are:: retrun home          Expected Discharge Plan and Services       Living arrangements for the past 2 months: Single Family Home                                      Prior Living Arrangements/Services Living arrangements for the past 2 months: Single Family Home Lives with:: Self Patient language and need for interpreter reviewed:: Yes Do you feel safe going back to the place where you live?: Yes      Need for Family Participation in Patient Care: No (Comment) Care giver support system in place?: No (comment)   Criminal Activity/Legal Involvement Pertinent to Current Situation/Hospitalization: No - Comment as needed  Activities of Daily Living   ADL Screening (condition at time of admission) Independently performs ADLs?: Yes (appropriate for developmental age) Is the patient deaf or have difficulty hearing?: No Does the patient have difficulty seeing, even when wearing glasses/contacts?: No Does the patient have difficulty concentrating, remembering, or  making decisions?: No  Permission Sought/Granted                  Emotional Assessment Appearance:: Appears stated age            Admission diagnosis:  Transaminitis [R74.01] Pancreatic mass [K86.89] Elevated bilirubin [R17] Patient Active Problem List   Diagnosis Date Noted   Pancreatic mass 03/16/2024   Elevated bilirubin 03/16/2024   Abnormal CT of the abdomen 03/16/2024   Obstructive jaundice (HCC) 03/15/2024   Transaminitis 03/14/2024   Exposure to Agent Orange 06/23/2021   S/P CABG x 3 02/17/2018   Coronary artery disease involving native coronary artery of native heart with other form of angina pectoris 02/08/2018   Systolic HF (heart failure) (HCC) 02/08/2018   Nonischemic cardiomyopathy (HCC) 07/03/2015   Anemia, chronic disease 07/01/2015   Primary osteoarthritis of knees, bilateral 12/10/2014   MGUS (monoclonal gammopathy of unknown significance) 06/30/2010   Chronic kidney disease (CKD), stage III (moderate) (HCC) 03/24/2010   Benign essential hypertension 10/31/2008   Mixed hyperlipidemia 10/30/2008   PCP:  [name], MD Pharmacy:   [facility] [address] [place] - [address] [place] [address] Phone: [phone] Fax: [phone]     Social Drivers of Health (SDOH) Social History: SDOH Screenings   Food Insecurity: No Food Insecurity (03/15/2024)  Housing: Low Risk (03/15/2024)  Transportation Needs:  Unmet Transportation Needs (03/15/2024)  Utilities: Not At Risk (03/15/2024)  Depression (PHQ2-9): Low Risk (04/01/2023)  Financial Resource Strain: Low Risk (04/01/2023)  Physical Activity: Sufficiently Active (04/01/2023)  Social Connections: Moderately Integrated (03/15/2024)  Stress: No Stress Concern Present (04/01/2023)  Tobacco Use: Low Risk (03/14/2024)  Health Literacy: Adequate Health Literacy (04/01/2023)   SDOH Interventions:     Readmission Risk Interventions     No data to display

## 2024-03-16 NOTE — Consult Note (Signed)
 Renal Service Consult Note [facility] Lamar JONETTA Fret, MD  Patient: Luis Chen Date: 03/16/2024 Requesting Physician: Dr. Caleen  Reason for Consult: Renal failure HPI: The patient is a 84 y.o. year-old w/ PMH sig for CKD kb, CAD, MGUS, HTN, systolic HF, HL who presented to ED on 1/06 c/o dizziness and lightheadness at home and took his BP and it was 90 systolic. Has been losing weight since losing appetite in Dec 2025. Stated he had lost 15 lbs in 3 wks. In ED BP's 104/58, HR 75, RR 20, temp 97.5. labs showed K+ 4.5, bun 27, creat 2.57, alk phos 1876, alb 3.3, lipase 62, AST 22, ALT 285, Tbili 8.2, eGFR 24. LA 1.2, WBC 6K, Hb 9.2 -> 8.4.  plt wnl. UA amber, clear, neg LE/ nit, prot 100, rbc 11-20, wbc 6-10, rare bact, no epis. UNa 36, UCr 93.   CT abd showed normal appearing kidney, no stones, no hydro. No stranding. Bladder wall mildly thickened likely due to outlet obstruction.   Pt seen in room. Denies any hx of kidney problems. Hx of CHF dx'd about 10-15 yrs ago. Denies any voiding issues. Poor appetite which started in Dec and has lost sig amt of weight. No f/c/s, no n/v/d.    ROS - denies CP, no joint pain, no HA, no blurry vision, no rash, no diarrhea, no nausea/ vomiting   Past Medical History  Past Medical History:  Diagnosis Date   Anemia, chronic disease 07/01/2015   Angina decubitus    Benign essential hypertension 10/31/2008   Qualifier: Diagnosis of  By: Lavona, MD, CODY Agent     CAD (coronary artery disease)    non obstructive. Left main normal. LAd proximal long 25% stenosis, termingating as focal 50% prox to mid lesion. First & second diag were small, normal. Circumflex in proximal av groove had luminal irregularities.. Was large mid obtuse marg which was branching, scattered luminal irregul. Infer branch did supply some septal perforators. Was PDA,small & normal.  < 1.14mm. There was prox 95% stenosis   CARDIOMYOPATHY 11/27/2008   CKD (chronic kidney  disease)    Hyperlipidemia    transient history, controlled on diet   Hypertension    Mixed hyperlipidemia 10/30/2008   Qualifier: Diagnosis of  By: Leana, CNA, [name]     Non-traumatic rhabdomyolysis 07/03/2015   Polyarthropathy 07/01/2015   Primary osteoarthritis of knees, bilateral 12/10/2014   Pyogenic arthritis of left knee joint (HCC)    Swelling of left knee joint 07/01/2015   Systolic HF (heart failure) (HCC) 02/08/2018   Past Surgical History  Past Surgical History:  Procedure Laterality Date   CORONARY ARTERY BYPASS GRAFT N/A 02/17/2018   Procedure: CORONARY ARTERY BYPASS GRAFTING (CABG);  Surgeon: Lucas Dorise POUR, MD;  Location: [facility] OR;  Service: Open Heart Surgery;  Laterality: N/A;  Times 3 using endoscopically harvested right saphenous vein.     KNEE ARTHROSCOPY Left 07/01/2015   Procedure: Irrigation and debridement left knee arthroscopy;  Surgeon: Evalene JONETTA Chancy, MD;  Location: [facility] OR;  Service: Orthopedics;  Laterality: Left;   RIGHT/LEFT HEART CATH AND CORONARY ANGIOGRAPHY N/A 02/08/2018   Procedure: RIGHT/LEFT HEART CATH AND CORONARY ANGIOGRAPHY;  Surgeon: Dann Candyce RAMAN, MD;  Location[facility] INVASIVE CV LAB;  Service: Cardiovascular;  Laterality: N/A;   TEE WITHOUT CARDIOVERSION N/A 02/17/2018   Procedure: TRANSESOPHAGEAL ECHOCARDIOGRAM (TEE);  Surgeon: Lucas Dorise POUR, MD;  Location: [facility] OR;  Service: Open Heart Surgery;  Laterality: N/A;   Family History  Family History  Problem Relation Age of Onset   Diabetes Mother    Arthritis Mother    Heart disease Mother    Hyperlipidemia Mother    Hypertension Mother    Heart attack Mother 66   Heart disease Brother        had in his 15s. Had CABG x2   Hypothyroidism Daughter    Stroke Daughter    Social History  reports that he has never smoked. He has never used smokeless tobacco. He reports current alcohol use. He reports that he does not use drugs. Allergies Allergies[1] Home medications Prior to Admission  medications  Medication Sig Start Date End Date Taking? Authorizing Provider  aspirin  81 MG EC tablet Take 1 tablet (81 mg total) by mouth daily. 07/28/19  Yes Meng, Hao, PA  carvedilol  (COREG ) 6.25 MG tablet Take 0.5 tablets (3.125 mg total) by mouth 2 (two) times daily with a meal. 05/28/20  Yes [name], MD  Cholecalciferol (VITAMIN D3) 1000 units CAPS Take 1,000 Units by mouth daily.   Yes [provider]  diclofenac  Sodium (VOLTAREN) 1 % GEL 2 g 4 (four) times daily as needed (for pain- left knee and any other affected area). 03/08/23  Yes [provider]  ezetimibe  (ZETIA ) 10 MG tablet Take 10 mg by mouth daily.   Yes [provider]  Ferrous Gluconate  324 (37.5 Fe) MG TABS TAKE 1 TABLET BY MOUTH TWICE DAILY WITH MEALS Patient taking differently: Take 324 mg by mouth See admin instructions. Take 324 mg by mouth with food on Mon/Wed/Fri 02/08/18  Yes Shadad, Firas N, MD  KETOTIFEN FUMARATE OP Place 1 drop into both eyes See admin instructions. Ketotifen 0.025% eye drops - Instill 1 drop into both eyes one to two times a day   Yes [provider]  latanoprost  (XALATAN ) 0.005 % ophthalmic solution Place 1 drop into both eyes at bedtime. 01/22/17  Yes [provider]  losartan  (COZAAR ) 50 MG tablet Take 50 mg by mouth 2 (two) times daily.   Yes [provider]  Nutritional Supplements (SUPLENA/CARB STEADY) LIQD Take 237 mLs by mouth See admin instructions. Drink 237 ml's of Vanilla Suplena by mouth three times a day   Yes [provider]  rosuvastatin  (CRESTOR ) 40 MG tablet TAKE 1 TABLET BY MOUTH ONCE DAILY AT  6PM Patient taking differently: Take 40 mg by mouth at bedtime. 11/07/18  Yes [name], MD  traZODone  (DESYREL ) 150 MG tablet Take 150 mg by mouth at bedtime as needed for sleep.    Yes [provider]  trolamine salicylate (ASPERCREME) 10 % cream Apply 1 Application topically as needed (for left knee pain  and/or other affected areas).   Yes [provider]  ezetimibe  (ZETIA ) 10 MG tablet Take 1 tablet (10 mg total) by mouth daily. Patient not taking: Reported on 03/15/2024 12/05/18 03/15/24  Rolan Ezra RAMAN, MD  oxyCODONE -acetaminophen  (PERCOCET/ROXICET) 5-325 MG tablet Take 1 tablet by mouth every 6 (six) hours as needed for severe pain (pain score 7-10). Patient not taking: Reported on 03/15/2024 03/09/23   Henderly, Britni A, PA-C  spironolactone  (ALDACTONE ) 25 MG tablet Take 1 tablet (25 mg total) by mouth daily. Patient not taking: Reported on 03/15/2024 12/06/18 03/15/24  Rolan Ezra RAMAN, MD     Vitals:   03/15/24 2340 03/16/24 0427 03/16/24 0923 03/16/24 1418  BP: (!) 126/52 (!) 112/55 124/61 116/60  Pulse: 66 62 60 65  Resp: 16 16 16  20  Temp: 98.5 F (36.9 C) 99.1 F (37.3 C) 97.7 F (36.5 C) 97.8 F (36.6 C)  TempSrc: Oral Oral Oral Oral  SpO2: 97% 100% 100% 100%  Weight:      Height:       Exam Gen alert, no distress, pleasant elderly male Sclera anicteric, throat clear  Flat neck veins Chest clear bilat to bases RRR no MRG Abd soft ntnd no mass or ascites +bs, liver edge slightly palpable about 1/2 inch down Ext no LE or UE edema, no other edema Neuro is alert, Ox 3 , nf   Home relevant meds: Coreg   Lasix  20 every day Aldactone  25 every day   Date   Creat  eGFR (ml/min) 2010- 2013  1.26- 1.70 2017- 2019  1.20- 1.91 2020   1.40- 2.31 2021- 2023  1.42- 2.13 2024   1.29- 1.53 45- 55 ml/min  12/21/23  1.38 1/06   2.57  24  1/07   2.75 1/08   3.20   18     UA - clear, neg LE/ nit, prot 100, rbc 11-20, wbc 6-10, rare bact UNa 36, UCr 93.   UOP 250-300 cc/d since admission CT abd ->  showed normal appearing kidney, no stones, no hydro. No stranding. Bladder wall mildly thickened likely due to outlet obstruction ED Rx = 1 L NS bolus, then LR at 125/hr IV zosyn  No CT contrast    Assessment/ Plan:  # AKI  - creat 2.5 on admission in setting of  dizziness, wt loss, loss of appetite and soft bp's, new mass at head of pancreas w/ severe biliary dilatation, w/u in progress. No vol excess on exam. CT showed -> normal kidneys, no hydro and bladder thickened d/t bladder outlet obstruction. UNa borderline low. Prior hx of systolic CHF, but doesn't appear clinically to be overloaded. Suspect AKI due to vol depletion/ hypotension +/- bladder outlet obstruction, less likely bilirubin nephropathy (usually bili level is higher) vs other. H/o MGUS, will send off SPEP and serum FLC as well. Cont IVFs will ^ rate, place foley catheter, f/u LFTs. Will follow.   # CKD 3a - b/l creatinine 1.3-1.5 in 2024 and oct 2025, eGFR 45- 55 ml/min  # pancreatic mass  # biliary obstruction  # h/o systolic CHF       Myer Fret  MD [facility] 03/16/2024, 4:49 PM  Recent Labs  Lab 03/15/24 0947 03/16/24 0447  CREATININE 2.75* 3.20*  K 4.3 4.0   Inpatient medications:  latanoprost   1 drop Both Eyes Daily   senna  1 tablet Oral BID    sodium chloride      piperacillin -tazobactam (ZOSYN )  IV 2.25 g (03/16/24 1201)   acetaminophen  **OR** acetaminophen , morphine  injection, ondansetron  (ZOFRAN ) IV      [1]  Allergies Allergen Reactions   Ace Inhibitors Swelling and Other (See Comments)    Facial swelling    Entresto  [Sacubitril-Valsartan] Swelling and Other (See Comments)    Angiotensin Receptor Neprilysin Inhibitor

## 2024-03-16 NOTE — Evaluation (Signed)
 Physical Therapy Evaluation Patient Details Name: Luis Chen MRN: [id number] DOB: 1941-02-22 Today's Date: 03/16/2024  History of Present Illness  Luis Chen is an 84 y.o. male presents with concern for low blood pressure and lightheadedness. Abdominal MRI/MRCP with and without contrast identified a 3.3 x 2.3 x 2.1 cm pancreatic head mass associated with several dorsal pancreatic duct and biliary dilatation suspicious for pancreatic adenocarcinoma.  Mild gallbladder distention and mild gallbladder wall thickening also noted. PMH: chronic kidney disease stage IIIb, coronary artery disease, MGUS, hyperlipidemia  Clinical Impression  Pt admitted with above diagnosis. PTA, pt reports ind without AD, denies falls, drives, grandson who is in school and works part time lives with pt, daughter present lives in [place] and calls pt daily, pt active with the [employer] and bowls 2x/week on a team. On eval, pt with good strength, AROM WFL, denies numbness/tingling throughout BLE, denies dizziness/lightheadedness throughout eval. Pt completes transfers and amb with supv, no AD needed, noted to have slight R knee static flexion in stance and RLE external rotation, no overt LOB, able to complete head turns and navigate obstacles in hallway without LOB. Anticipate good progress in acute care with no f/u at discharge. Pt currently with functional limitations due to the deficits listed below (see PT Problem List). Pt will benefit from acute skilled PT to increase their independence and safety with mobility to allow discharge.           If plan is discharge home, recommend the following:     Can travel by private vehicle        Equipment Recommendations None recommended by PT  Recommendations for Other Services       Functional Status Assessment Patient has had a recent decline in their functional status and demonstrates the ability to make significant improvements in function in a reasonable and predictable  amount of time.     Precautions / Restrictions Precautions Precautions: Fall Recall of Precautions/Restrictions: Intact Restrictions Weight Bearing Restrictions Per Provider Order: No      Mobility  Bed Mobility Overal bed mobility: Modified Independent             General bed mobility comments: no assist, using bedrail and elevated HOB as needed    Transfers Overall transfer level: Needs assistance Equipment used: None Transfers: Sit to/from Stand Sit to Stand: Supervision           General transfer comment: powers to stand with BUE assisting    Ambulation/Gait Ambulation/Gait assistance: Supervision Gait Distance (Feet): 350 Feet Assistive device: IV Pole Gait Pattern/deviations: Step-through pattern, Decreased stride length Gait velocity: WFL     General Gait Details: step through gait pattern with slight R knee static flexion and RLE external rotation in gait pattern, pt reports feeling at baseline, able to navigate obstacles and complete head turns without LOB, conversational without dyspnea noted  Stairs            Wheelchair Mobility     Tilt Bed    Modified Rankin (Stroke Patients Only)       Balance Overall balance assessment: No apparent balance deficits (not formally assessed)                                           Pertinent Vitals/Pain Pain Assessment Pain Assessment: No/denies pain    Home Living Family/patient expects to be discharged to:: Private  residence Living Arrangements: Other relatives (grandson who works and is in school) Available Help at Discharge: Family;Available PRN/intermittently Type of Home: House Home Access: Stairs to enter   Entrance Stairs-Number of Steps: 3 Alternate Level Stairs-Number of Steps: 8-9 Home Layout: Multi-level Home Equipment: Agricultural Consultant (2 wheels);Cane - single point;BSC/3in1 Additional Comments: pt's daughter present, reports lives in [place] and calls pt  daily    Prior Function Prior Level of Function : Independent/Modified Independent;Driving             Mobility Comments: pt reports ind without AD, denies falls, bowls twice a week ADLs Comments: pt reports ind with ADLs/IADLs     Extremity/Trunk Assessment   Upper Extremity Assessment Upper Extremity Assessment: Overall WFL for tasks assessed    Lower Extremity Assessment Lower Extremity Assessment: Overall WFL for tasks assessed (AROM WFL, strength grossly 3+/5, denies numbness/tingling)    Cervical / Trunk Assessment Cervical / Trunk Assessment: Normal  Communication   Communication Communication: No apparent difficulties    Cognition Arousal: Alert Behavior During Therapy: WFL for tasks assessed/performed   PT - Cognitive impairments: No apparent impairments                         Following commands: Intact       Cueing       General Comments General comments (skin integrity, edema, etc.): HR 80s-90s on tele monitor on RA without dyspnea noted, denies dizziness/lightheadedness    Exercises     Assessment/Plan    PT Assessment Patient needs continued PT services  PT Problem List Decreased activity tolerance;Decreased balance;Cardiopulmonary status limiting activity       PT Treatment Interventions Gait training;Stair training;DME instruction;Functional mobility training;Therapeutic activities;Therapeutic exercise;Patient/family education    PT Goals (Current goals can be found in the Care Plan section)  Acute Rehab PT Goals Patient Stated Goal: return home, resume bowling PT Goal Formulation: With patient/family Time For Goal Achievement: 03/30/24 Potential to Achieve Goals: Good    Frequency Min 2X/week     Co-evaluation               AM-PAC PT 6 Clicks Mobility  Outcome Measure Help needed turning from your back to your side while in a flat bed without using bedrails?: None Help needed moving from lying on your back to  sitting on the side of a flat bed without using bedrails?: None Help needed moving to and from a bed to a chair (including a wheelchair)?: A Little Help needed standing up from a chair using your arms (e.g., wheelchair or bedside chair)?: A Little Help needed to walk in hospital room?: A Little Help needed climbing 3-5 steps with a railing? : A Little 6 Paparella Score: 20    End of Session Equipment Utilized During Treatment: Gait belt Activity Tolerance: Patient tolerated treatment well Patient left: in bed;with call bell/phone within reach;with bed alarm set;with family/visitor present Nurse Communication: Mobility status PT Visit Diagnosis: Unsteadiness on feet (R26.81);Other abnormalities of gait and mobility (R26.89)    Time: 8675-8653 PT Time Calculation (min) (ACUTE ONLY): 22 min   Charges:   PT Evaluation $PT Eval Low Complexity: 1 Low   PT General Charges $$ ACUTE PT VISIT: 1 Visit         Tori Lopaka Karge PT, DPT 03/16/2024, 1:56 PM

## 2024-03-17 ENCOUNTER — Encounter (HOSPITAL_COMMUNITY)
Admission: EM | Disposition: A | Discharge status: Home Health | Payer: Self-pay | Source: Home / Self Care | Attending: Internal Medicine

## 2024-03-17 ENCOUNTER — Inpatient Hospital Stay (HOSPITAL_COMMUNITY)

## 2024-03-17 ENCOUNTER — Inpatient Hospital Stay (HOSPITAL_COMMUNITY): Admitting: Certified Registered"

## 2024-03-17 ENCOUNTER — Encounter (HOSPITAL_COMMUNITY): Payer: Self-pay | Admitting: Internal Medicine

## 2024-03-17 DIAGNOSIS — I130 Hypertensive heart and chronic kidney disease with heart failure and stage 1 through stage 4 chronic kidney disease, or unspecified chronic kidney disease: Secondary | ICD-10-CM | POA: Diagnosis not present

## 2024-03-17 DIAGNOSIS — K831 Obstruction of bile duct: Secondary | ICD-10-CM | POA: Diagnosis not present

## 2024-03-17 DIAGNOSIS — I5020 Unspecified systolic (congestive) heart failure: Secondary | ICD-10-CM

## 2024-03-17 DIAGNOSIS — K8689 Other specified diseases of pancreas: Secondary | ICD-10-CM | POA: Diagnosis not present

## 2024-03-17 DIAGNOSIS — N183 Chronic kidney disease, stage 3 unspecified: Secondary | ICD-10-CM | POA: Diagnosis not present

## 2024-03-17 DIAGNOSIS — I2510 Atherosclerotic heart disease of native coronary artery without angina pectoris: Secondary | ICD-10-CM | POA: Diagnosis not present

## 2024-03-17 HISTORY — PX: ERCP: SHX5425

## 2024-03-17 HISTORY — PX: IR INT EXT BILIARY DRAIN WITH CHOLANGIOGRAM: IMG6044

## 2024-03-17 LAB — BASIC METABOLIC PANEL WITH GFR
Anion gap: 11 (ref 5–15)
BUN: 16 mg/dL (ref 8–23)
CO2: 22 mmol/L (ref 22–32)
Calcium: 8.9 mg/dL (ref 8.9–10.3)
Chloride: 109 mmol/L (ref 98–111)
Creatinine, Ser: 3.09 mg/dL — ABNORMAL HIGH (ref 0.61–1.24)
GFR, Estimated: 19 mL/min — ABNORMAL LOW
Glucose, Bld: 89 mg/dL (ref 70–99)
Potassium: 3.7 mmol/L (ref 3.5–5.1)
Sodium: 141 mmol/L (ref 135–145)

## 2024-03-17 LAB — CBC
HCT: 21.8 % — ABNORMAL LOW (ref 39.0–52.0)
Hemoglobin: 8.1 g/dL — ABNORMAL LOW (ref 13.0–17.0)
MCH: 31.4 pg (ref 26.0–34.0)
MCHC: 37.2 g/dL — ABNORMAL HIGH (ref 30.0–36.0)
MCV: 84.5 fL (ref 80.0–100.0)
Platelets: 238 K/uL (ref 150–400)
RBC: 2.58 MIL/uL — ABNORMAL LOW (ref 4.22–5.81)
RDW: 17.9 % — ABNORMAL HIGH (ref 11.5–15.5)
WBC: 6.5 K/uL (ref 4.0–10.5)
nRBC: 0 % (ref 0.0–0.2)

## 2024-03-17 LAB — HEPATIC FUNCTION PANEL
ALT: 228 U/L — ABNORMAL HIGH (ref 0–44)
AST: 260 U/L — ABNORMAL HIGH (ref 15–41)
Albumin: 2.6 g/dL — ABNORMAL LOW (ref 3.5–5.0)
Alkaline Phosphatase: 1545 U/L — ABNORMAL HIGH (ref 38–126)
Bilirubin, Direct: 6.2 mg/dL — ABNORMAL HIGH (ref 0.0–0.2)
Indirect Bilirubin: 1.6 mg/dL — ABNORMAL HIGH (ref 0.3–0.9)
Total Bilirubin: 7.8 mg/dL — ABNORMAL HIGH (ref 0.0–1.2)
Total Protein: 5.1 g/dL — ABNORMAL LOW (ref 6.5–8.1)

## 2024-03-17 LAB — KAPPA/LAMBDA LIGHT CHAINS
Kappa free light chain: 104.1 mg/L — ABNORMAL HIGH (ref 3.3–19.4)
Kappa, lambda light chain ratio: 3.03 — ABNORMAL HIGH (ref 0.26–1.65)
Lambda free light chains: 34.4 mg/L — ABNORMAL HIGH (ref 5.7–26.3)

## 2024-03-17 LAB — CANCER ANTIGEN 19-9: CA 19-9: 2010 U/mL — ABNORMAL HIGH (ref 0–35)

## 2024-03-17 MED ORDER — MIDAZOLAM HCL (PF) 2 MG/2ML IJ SOLN
INTRAMUSCULAR | Status: DC | PRN
  Administered 2024-03-17: 1 mg via INTRAVENOUS

## 2024-03-17 MED ORDER — MIDAZOLAM HCL 2 MG/2ML IJ SOLN
INTRAMUSCULAR | Status: AC
  Filled 2024-03-17: qty 2

## 2024-03-17 MED ORDER — ONDANSETRON HCL 4 MG/2ML IJ SOLN
INTRAMUSCULAR | Status: DC | PRN
  Administered 2024-03-17: 4 mg via INTRAVENOUS

## 2024-03-17 MED ORDER — SODIUM CHLORIDE 0.9 % IV SOLN: INTRAVENOUS | Status: DC

## 2024-03-17 MED ORDER — LIDOCAINE-EPINEPHRINE 1 %-1:100000 IJ SOLN
INTRAMUSCULAR | Status: AC
  Filled 2024-03-17: qty 20

## 2024-03-17 MED ORDER — MEPERIDINE HCL 25 MG/ML IJ SOLN
INTRAMUSCULAR | Status: DC | PRN
  Administered 2024-03-17: 12.5 mg via INTRAVENOUS

## 2024-03-17 MED ORDER — VASOPRESSIN 20 UNIT/ML IV SOLN
INTRAVENOUS | Status: AC
  Filled 2024-03-17: qty 1

## 2024-03-17 MED ORDER — SODIUM CHLORIDE 0.9 % IV SOLN
INTRAVENOUS | Status: AC
  Filled 2024-03-17: qty 20

## 2024-03-17 MED ORDER — GLUCAGON HCL RDNA (DIAGNOSTIC) 1 MG IJ SOLR
INTRAMUSCULAR | Status: DC | PRN
  Administered 2024-03-17: .5 mg via INTRAVENOUS

## 2024-03-17 MED ORDER — PROPOFOL 1000 MG/100ML IV EMUL
INTRAVENOUS | Status: AC
  Filled 2024-03-17: qty 100

## 2024-03-17 MED ORDER — SODIUM CHLORIDE 0.9 % IV SOLN
INTRAVENOUS | Status: DC | PRN
  Administered 2024-03-17: 20 mL

## 2024-03-17 MED ORDER — ROCURONIUM BROMIDE 10 MG/ML (PF) SYRINGE
PREFILLED_SYRINGE | INTRAVENOUS | Status: DC | PRN
  Administered 2024-03-17: 10 mg via INTRAVENOUS
  Administered 2024-03-17: 40 mg via INTRAVENOUS

## 2024-03-17 MED ORDER — FENTANYL CITRATE (PF) 100 MCG/2ML IJ SOLN
INTRAMUSCULAR | Status: DC | PRN
  Administered 2024-03-17: 50 ug via INTRAVENOUS

## 2024-03-17 MED ORDER — FENTANYL CITRATE (PF) 100 MCG/2ML IJ SOLN
INTRAMUSCULAR | Status: AC
  Filled 2024-03-17: qty 2

## 2024-03-17 MED ORDER — PROPOFOL 10 MG/ML IV BOLUS
INTRAVENOUS | Status: AC
  Filled 2024-03-17: qty 20

## 2024-03-17 MED ORDER — PROPOFOL 500 MG/50ML IV EMUL
INTRAVENOUS | Status: AC
  Filled 2024-03-17: qty 50

## 2024-03-17 MED ORDER — DICLOFENAC SUPPOSITORY 100 MG
RECTAL | Status: AC
  Filled 2024-03-17: qty 1

## 2024-03-17 MED ORDER — DICLOFENAC SUPPOSITORY 100 MG
RECTAL | Status: DC | PRN
  Administered 2024-03-17: 100 mg via RECTAL

## 2024-03-17 MED ORDER — SUGAMMADEX SODIUM 200 MG/2ML IV SOLN
INTRAVENOUS | Status: DC | PRN
  Administered 2024-03-17: 150 mg via INTRAVENOUS

## 2024-03-17 MED ORDER — IOHEXOL 300 MG/ML  SOLN: 50.0000 mL | Freq: Once | INTRAMUSCULAR | Status: DC | PRN

## 2024-03-17 MED ORDER — DEXAMETHASONE SOD PHOSPHATE PF 10 MG/ML IJ SOLN
INTRAMUSCULAR | Status: DC | PRN
  Administered 2024-03-17: 4 mg via INTRAVENOUS

## 2024-03-17 MED ORDER — CHLORHEXIDINE GLUCONATE CLOTH 2 % EX PADS
6.0000 | MEDICATED_PAD | Freq: Every day | CUTANEOUS | Status: DC
  Administered 2024-03-17 – 2024-03-30 (×12): 6 via TOPICAL

## 2024-03-17 MED ORDER — GLUCAGON HCL RDNA (DIAGNOSTIC) 1 MG IJ SOLR
INTRAMUSCULAR | Status: AC
  Filled 2024-03-17: qty 2

## 2024-03-17 MED ORDER — LIDOCAINE-EPINEPHRINE 1 %-1:100000 IJ SOLN: 20.0000 mL | Freq: Once | INTRAMUSCULAR | Status: DC

## 2024-03-17 MED ORDER — SODIUM CHLORIDE 0.9 % IV SOLN
2.0000 g | Freq: Once | INTRAVENOUS | Status: AC
  Administered 2024-03-20: 2 g via INTRAVENOUS
  Filled 2024-03-17: qty 20

## 2024-03-17 MED ORDER — VASOPRESSIN 20 UNIT/ML IV SOLN
INTRAVENOUS | Status: DC | PRN
  Administered 2024-03-17: 1 [IU] via INTRAVENOUS

## 2024-03-17 MED ORDER — PHENYLEPHRINE HCL-NACL 20-0.9 MG/250ML-% IV SOLN
INTRAVENOUS | Status: DC | PRN
  Administered 2024-03-17: 100 ug/min via INTRAVENOUS

## 2024-03-17 MED ORDER — PHENYLEPHRINE 80 MCG/ML (10ML) SYRINGE FOR IV PUSH (FOR BLOOD PRESSURE SUPPORT)
PREFILLED_SYRINGE | INTRAVENOUS | Status: DC | PRN
  Administered 2024-03-17: 80 ug via INTRAVENOUS
  Administered 2024-03-17 (×2): 160 ug via INTRAVENOUS

## 2024-03-17 MED ORDER — PROPOFOL 10 MG/ML IV BOLUS
INTRAVENOUS | Status: DC | PRN
  Administered 2024-03-17: 110 mg via INTRAVENOUS

## 2024-03-17 MED ORDER — SODIUM CHLORIDE 0.9% FLUSH
5.0000 mL | Freq: Three times a day (TID) | INTRAVENOUS | Status: DC
  Administered 2024-03-17 – 2024-03-23 (×17): 5 mL

## 2024-03-17 MED ORDER — MEPERIDINE HCL 25 MG/ML IJ SOLN: 25.0000 mg | Freq: Once | INTRAMUSCULAR | Status: DC

## 2024-03-17 MED ORDER — DICLOFENAC SUPPOSITORY 100 MG: 100.0000 mg | Freq: Once | RECTAL | Status: DC

## 2024-03-17 MED ORDER — CIPROFLOXACIN IN D5W 400 MG/200ML IV SOLN
INTRAVENOUS | Status: AC
  Filled 2024-03-17: qty 200

## 2024-03-17 MED ORDER — LIDOCAINE HCL (CARDIAC) PF 100 MG/5ML IV SOSY
PREFILLED_SYRINGE | INTRAVENOUS | Status: DC | PRN
  Administered 2024-03-17: 70 mg via INTRAVENOUS

## 2024-03-17 MED ORDER — STERILE WATER FOR INJECTION IV SOLN
INTRAVENOUS | Status: AC
  Filled 2024-03-17: qty 150
  Filled 2024-03-17 (×2): qty 1000

## 2024-03-17 NOTE — Progress Notes (Signed)
 RN arrived to take patient for ERCP procedure, telemetry removed. Pt to be placed on tele in Endo.

## 2024-03-17 NOTE — Procedures (Addendum)
 Vascular and Interventional Radiology Procedure Note  Patient: Luis Chen DOB: 1940/05/26 Medical Record Number: [id number] Note Date/Time: 03/17/2024 4:11 PM   Performing Physician: Thom Hall, MD Assistant(s): None  Diagnosis: Biliary obstruction, suspected malignant  Procedure:  PERCUTANEOUS TRANSHEPATIC BILIARY TUBE PLACEMENT ANTEROGRADE CHOLANGIOGRAM  Anesthesia: Conscious Sedation Complications: None Estimated Blood Loss: Minimal Specimens:  Microbiology and Cytology  Findings:  Successful placement of 32F PTBD via a R hepatic approach. Biliary dilatation w shouldering at distal CBD suspicious for malignancy.  Plan: Flush tube w 5 mL sterile NS q8h and record drain output qShift. Follow up for routine tube evaluation in 6-8 week(s).   See detailed procedure note with images in PACS. The patient tolerated the procedure well without incident or complication and was returned to Recovery in stable condition.    Thom Hall, MD [facility]   Pager. [phone] Clinic. [phone]

## 2024-03-17 NOTE — Progress Notes (Signed)
 MAR at 1600, Rocephin  given by IR RN during procedure per report.

## 2024-03-17 NOTE — Progress Notes (Signed)
 [facility] Progress Note  Subjective:  BP's good UOP 875, much better, yesterday I/O yest 2.6 in and 800 cc out = +1.7 L  Creat down to 3.0 today   Presentation summary: CKD 3a, CAD, MGUS, HTN, systolic HF, HL who presented to ED on 1/06 c/o dizziness and lightheadness at home and took his BP and it was 90 systolic. Has been losing weight since losing appetite in Dec 2025. Stated he had lost 15 lbs in 3 wks. In ED BP's 104/58, HR 75, RR 20, temp 97.5. labs showed K+ 4.5, bun 27, creat 2.57, alk phos 1876, alb 3.3, lipase 62, AST 22, ALT 285, Tbili 8.2, eGFR 24. LA 1.2, WBC 6K, Hb 9.2 -> 8.4.  plt wnl. UA amber, clear, neg LE/ nit, prot 100, rbc 11-20, wbc 6-10, rare bact, no epis. UNa 36, UCr 93. CT abd showed normal appearing kidney, no stones, no hydro. No stranding. Bladder wall mildly thickened likely due to outlet obstruction.  Pt seen in room. Denies any hx of kidney problems. Hx of CHF dx'd about 10-15 yrs ago. Denies any voiding issues. Poor appetite which started in Dec and has lost sig amt of weight. No f/c/s, no n/v/d.     Vitals:   03/17/24 1335 03/17/24 1340 03/17/24 1345 03/17/24 1350  BP:  (!) 140/57  (!) 129/47  Pulse: 68 70 66 66  Resp: 12 17 13 16   Temp: 98.7 F (37.1 C)     TempSrc: Temporal     SpO2: 100% 98% 97% 97%  Weight:      Height:        Exam: Gen alert, no distress, pleasant elderly male Sclera anicteric, throat clear  Flat neck veins Chest clear bilat to bases RRR no MRG Abd soft ntnd no mass or ascites +bs, liver edge slightly palpable about 1/2 inch down Ext no LE or UE edema, no other edema Neuro is alert, Ox 3 , nf     Home relevant meds: Coreg   Lasix  20 every day Aldactone  25 every day     Date                             Creat               eGFR (ml/min) 2010- 2013                  1.26- 1.70 2017- 2019                  1.20- 1.91 2020                            1.40- 2.31 2021- 2023                  1.42- 2.13 2024                             1.29- 1.53        45- 55 ml/min   12/21/23                      1.38 1/06                             2.57  24         1/07                             2.75 1/08                             3.20                 18             UA - clear, neg LE/ nit, prot 100, rbc 11-20, wbc 6-10, rare bact UNa 36, UCr 93.   UOP 250-300 cc/d since admission CT abd ->  showed normal appearing kidney, no stones, no hydro. No stranding. Bladder wall mildly thickened likely due to outlet obstruction ED Rx = 1 L NS bolus, then LR at 125/hr IV zosyn  No CT contrast       Assessment/ Plan:   # AKI  - creat 2.5 on admission in setting of dizziness, wt loss, loss of appetite and soft bp's, new mass at head of pancreas w/ severe biliary dilatation, w/u in progress. No vol excess on exam. CT showed no hydro and bladder thickened possibly d/t bladder outlet obstruction. UNa borderline low. Prior hx of systolic CHF, but doesn't look vol overloaded here. Suspect AKI due to vol depletion +/- bladder outlet obstruction, less likely bilirubin nephropathy (usually bili would be higher) vs other. H/o MGUS, will send off SPEP and serum FLC as well. IVFs were ^'d and foley placed. UOP improved to 800 cc yest and creat down at 3.0 today. Cont IVFs, lower to 100 cc/hr, cont foley cath, f/u labs in am.    # CKD 3a - b/l creatinine 1.3-1.5 in 2024 and oct 2025, eGFR 45- 55 ml/min   # pancreatic mass/ biliary obstruction - suspected malignancy per GI notes - undergoing ERCP today   # h/o systolic CHF     Rob Kemba Hoppes MD  [facility] 03/17/2024, 1:54 PM  Recent Labs  Lab 03/16/24 0447 03/17/24 0433  HGB 8.4* 8.1*  ALBUMIN  2.7* 2.6*  CALCIUM  9.0 8.9  CREATININE 3.20* 3.09*  K 4.0 3.7   Recent Labs  Lab 03/16/24 0447  IRON  68  TIBC 213*  FERRITIN 2,730*   Inpatient medications:  diclofenac   100 mg Rectal Once   [MAR Hold] latanoprost   1 drop Both Eyes Daily   [MAR Hold] senna   1 tablet Oral BID    sodium chloride      [MAR Hold] piperacillin -tazobactam (ZOSYN )  IV 2.25 g (03/17/24 0408)   sodium bicarbonate  150 mEq in sterile water  1,150 mL infusion 125 mL/hr (03/17/24 1159)   [MAR Hold] acetaminophen  **OR** [MAR Hold] acetaminophen , [MAR Hold]  morphine  injection, [MAR Hold] ondansetron  (ZOFRAN ) IV

## 2024-03-17 NOTE — Anesthesia Postprocedure Evaluation (Signed)
"   Anesthesia Post Note  Patient: Branden E Lasorsa  Procedure(s) Performed: ERCP, WITH INTERVENTION IF INDICATED     Patient location during evaluation: Endoscopy Anesthesia Type: General Level of consciousness: awake and alert Pain management: pain level controlled Vital Signs Assessment: post-procedure vital signs reviewed and stable Respiratory status: spontaneous breathing, nonlabored ventilation, respiratory function stable and patient connected to nasal cannula oxygen Cardiovascular status: blood pressure returned to baseline and stable Postop Assessment: no apparent nausea or vomiting Anesthetic complications: no   No notable events documented.  Last Vitals:  Vitals:   03/17/24 1720 03/17/24 2040  BP:  (!) 117/58  Pulse: 72 66  Resp:  15  Temp:  36.4 C  SpO2: 95% 99%    Last Pain:  Vitals:   03/17/24 2040  TempSrc: Oral  PainSc:                  Garnette DELENA Gab      "

## 2024-03-17 NOTE — Op Note (Signed)
 [facility] Patient Name: Luis Chen Procedure Date: 03/17/2024 MRN: [id number] Attending MD: Norleen SAILOR. Abran , MD, [id number] Date of Birth: Apr 15, 1940 CSN: [id number] Age: 84 Admit Type: Inpatient Procedure:                ERCP Indications:              Common bile duct stricture Providers:                Norleen SAILOR. Abran, MD, Ozell Pouch, Curtistine Bishop, Technician Referring MD:             [facility] hospitalist Medicines:                General Anesthesia Complications:            No immediate complications. Estimated Blood Loss:     Estimated blood loss: none. Procedure:                Pre-Anesthesia Assessment:                           - Prophylactic Antibiotics: The patient requires                            prophylactic antibiotics for the planned                            performance of dilation.                           After obtaining informed consent, the scope was                            passed under direct vision. Throughout the                            procedure, the patient's blood pressure, pulse, and                            oxygen saturations were monitored continuously. The                            [id number]-Q190V ([id number] Olympus duodenoscope was                            introduced through the mouth, and used to inject                            contrast into and used to inject contrast into the                            bile duct. Scope In: Scope Out: Findings:      1. The side-viewing endoscope was passed blindly into the esophagus.       There were no gross abnormalities of the gastric or duodenal mucosa.      2. There  was mild compression of the duodenum at the bulb/D2 junction,       presumably due to tumor      3. There was partial opacification of the bile duct which appeared to       have a 1.5 to 2 cm stricture with proximal dilation      4. Despite numerous attempts, the hydrophilic guidewire  would not find       its way into the proximal bile duct. At one juncture there was       submucosal injection with wire tracking submucosally.      5. No manipulation or opacification of the pancreatic duct      The procedure was aborted Impression:               1. 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 Failed biliary drainage as discussed above. Moderate Sedation:      general      none Recommendation:           1. Consult interventional radiology for                            percutaneous biliary drainage                           2. Keep NPO today until seen by IR.                           3. Continue antibiotics                           The procedure findings and plans as outlined above                            discussed with the patient and his wife. They were                            provided a copy of this report Procedure Code(s):        --- Professional ---                           [phone], Endoscopic retrograde                            cholangiopancreatography (ERCP); with removal of                            calculi/debris from biliary/pancreatic duct(s)                           43262, Endoscopic retrograde                            cholangiopancreatography (ERCP); with  sphincterotomy/papillotomy Diagnosis Code(s):        --- Professional ---                           K83.1, Obstruction of bile duct CPT copyright 2022 [employer]. All rights reserved. The codes documented in this report are preliminary and upon coder review may  be revised to meet current compliance requirements. Norleen SAILOR. Abran, MD 03/17/2024 1:23:50 PM This report has been signed electronically. Number of Addenda: 0

## 2024-03-17 NOTE — Consult Note (Addendum)
 "  Chief Complaint: Pancreatic head mass, biliary obstruction; failed ERCP; referred for image guided percutaneous transhepatic cholangiogram with biliary drain placement  Referring Provider(s): Perry,J  Supervising Physician: Hughes Simmonds  Patient Status: [facility] - In-pt  History of Present Illness: Luis Chen is an 84 y.o. male with past medical history significant for anemia, chronic kidney disease, coronary artery disease with prior CABG, cardiomyopathy, MGUS, hypertension, CHF, glaucoma, hyperlipidemia, osteoarthritis who was admitted to [facility] on 1/7 with weight loss, lightheadedness, low blood pressure, elevated liver function tests, obstructive jaundice with associated pancreatic head mass, biliary and pancreatic duct dilatation concerning for pancreatic adenocarcinoma, mild gallbladder distention and mild gallbladder wall thickening.  Current lab studies reveal normal WBC, hemoglobin 8.1, platelets 238k,, creatinine 3.09, total bilirubin 7.8, PT/INR normal, CA 19-9 2000.  Patient underwent failed ERCP today and request now received from GI team for percutaneous transhepatic cholangiogram with biliary drain placement.   Patient is Full Code  Past Medical History:  Diagnosis Date   Anemia, chronic disease 07/01/2015   Angina decubitus    Benign essential hypertension 10/31/2008   Qualifier: Diagnosis of  By: Lavona, MD, CODY Agent     CAD (coronary artery disease)    non obstructive. Left main normal. LAd proximal long 25% stenosis, termingating as focal 50% prox to mid lesion. First & second diag were small, normal. Circumflex in proximal av groove had luminal irregularities.. Was large mid obtuse marg which was branching, scattered luminal irregul. Infer branch did supply some septal perforators. Was PDA,small & normal.  < 1.67mm. There was prox 95% stenosis   CARDIOMYOPATHY 11/27/2008   CKD (chronic kidney disease)    Hyperlipidemia    transient history, controlled  on diet   Hypertension    Mixed hyperlipidemia 10/30/2008   Qualifier: Diagnosis of  By: Leana, CNA, [name]     Non-traumatic rhabdomyolysis 07/03/2015   Polyarthropathy 07/01/2015   Primary osteoarthritis of knees, bilateral 12/10/2014   Pyogenic arthritis of left knee joint (HCC)    Swelling of left knee joint 07/01/2015   Systolic HF (heart failure) (HCC) 02/08/2018    Past Surgical History:  Procedure Laterality Date   CORONARY ARTERY BYPASS GRAFT N/A 02/17/2018   Procedure: CORONARY ARTERY BYPASS GRAFTING (CABG);  Surgeon: Lucas Dorise POUR, MD;  Location: [facility] OR;  Service: Open Heart Surgery;  Laterality: N/A;  Times 3 using endoscopically harvested right saphenous vein.     KNEE ARTHROSCOPY Left 07/01/2015   Procedure: Irrigation and debridement left knee arthroscopy;  Surgeon: Evalene JONETTA Chancy, MD;  Location: [facility] OR;  Service: Orthopedics;  Laterality: Left;   RIGHT/LEFT HEART CATH AND CORONARY ANGIOGRAPHY N/A 02/08/2018   Procedure: RIGHT/LEFT HEART CATH AND CORONARY ANGIOGRAPHY;  Surgeon: Dann Candyce RAMAN, MD;  Location: [facility] INVASIVE CV LAB;  Service: Cardiovascular;  Laterality: N/A;   TEE WITHOUT CARDIOVERSION N/A 02/17/2018   Procedure: TRANSESOPHAGEAL ECHOCARDIOGRAM (TEE);  Surgeon: Lucas Dorise POUR, MD;  Location: [facility] OR;  Service: Open Heart Surgery;  Laterality: N/A;    Allergies: Ace inhibitors and Entresto  [sacubitril-valsartan]  Medications: Prior to Admission medications  Medication Sig Start Date End Date Taking? Authorizing Provider  aspirin  81 MG EC tablet Take 1 tablet (81 mg total) by mouth daily. 07/28/19  Yes Meng, Hao, PA  carvedilol  (COREG ) 6.25 MG tablet Take 0.5 tablets (3.125 mg total) by mouth 2 (two) times daily with a meal. 05/28/20  Yes [name], MD  Cholecalciferol (VITAMIN D3) 1000 units CAPS Take 1,000 Units by  mouth daily.   Yes [provider]  diclofenac  Sodium (VOLTAREN) 1 % GEL 2 g 4 (four) times daily as needed (for pain- left knee and  any other affected area). 03/08/23  Yes [provider]  ezetimibe  (ZETIA ) 10 MG tablet Take 10 mg by mouth daily.   Yes [provider]  Ferrous Gluconate  324 (37.5 Fe) MG TABS TAKE 1 TABLET BY MOUTH TWICE DAILY WITH MEALS Patient taking differently: Take 324 mg by mouth See admin instructions. Take 324 mg by mouth with food on Mon/Wed/Fri 02/08/18  Yes Shadad, Firas N, MD  KETOTIFEN FUMARATE OP Place 1 drop into both eyes See admin instructions. Ketotifen 0.025% eye drops - Instill 1 drop into both eyes one to two times a day   Yes [provider]  latanoprost  (XALATAN ) 0.005 % ophthalmic solution Place 1 drop into both eyes at bedtime. 01/22/17  Yes [provider]  losartan  (COZAAR ) 50 MG tablet Take 50 mg by mouth 2 (two) times daily.   Yes [provider]  Nutritional Supplements (SUPLENA/CARB STEADY) LIQD Take 237 mLs by mouth See admin instructions. Drink 237 ml's of Vanilla Suplena by mouth three times a day   Yes [provider]  rosuvastatin  (CRESTOR ) 40 MG tablet TAKE 1 TABLET BY MOUTH ONCE DAILY AT  6PM Patient taking differently: Take 40 mg by mouth at bedtime. 11/07/18  Yes [name], MD  traZODone  (DESYREL ) 150 MG tablet Take 150 mg by mouth at bedtime as needed for sleep.    Yes [provider]  trolamine salicylate (ASPERCREME) 10 % cream Apply 1 Application topically as needed (for left knee pain and/or other affected areas).   Yes [provider]  ezetimibe  (ZETIA ) 10 MG tablet Take 1 tablet (10 mg total) by mouth daily. Patient not taking: Reported on 03/15/2024 12/05/18 03/15/24  Rolan Ezra RAMAN, MD  oxyCODONE -acetaminophen  (PERCOCET/ROXICET) 5-325 MG tablet Take 1 tablet by mouth every 6 (six) hours as needed for severe pain (pain score 7-10). Patient not taking: Reported on 03/15/2024 03/09/23   Henderly, Britni A, PA-C  spironolactone  (ALDACTONE ) 25 MG tablet Take 1 tablet (25 mg total) by mouth  daily. Patient not taking: Reported on 03/15/2024 12/06/18 03/15/24  Rolan Ezra RAMAN, MD     Family History  Problem Relation Age of Onset   Diabetes Mother    Arthritis Mother    Heart disease Mother    Hyperlipidemia Mother    Hypertension Mother    Heart attack Mother 20   Heart disease Brother        had in his 46s. Had CABG x2   Hypothyroidism Daughter    Stroke Daughter     Social History   Socioeconomic History   Marital status: Widowed    Spouse name: Not on file   Number of children: Not on file   Years of education: Not on file   Highest education level: Not on file  Occupational History   Not on file  Tobacco Use   Smoking status: Never   Smokeless tobacco: Never  Vaping Use   Vaping status: Never Used  Substance and Sexual Activity   Alcohol use: Yes    Comment: one glass of wine or beer occassionaly   Drug use: No   Sexual activity: Yes  Other Topics Concern   Not on file  Social History Narrative   Lives with grandson (52 yo), wife passed on Apr 10, 2000. 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    Enjoys bowling- Is in a bowling league    02/11/2018 @ 1352 Pt discharged home with niece. No s/s of distress noted. Education completed, discharge instructions provided and understanding verbalized. Iv sites removed *2. Occlusive drsg per procedure-cdi. No c/o pain/nor distress.   Social Drivers of Health   Tobacco Use: Low Risk (03/17/2024)   Patient History    Smoking Tobacco Use: Never    Smokeless Tobacco Use: Never    Passive Exposure: Not on file  Financial Resource Strain: Low Risk (04/01/2023)   Overall Financial Resource Strain (CARDIA)    Difficulty of Paying Living Expenses: Not hard at all  Food Insecurity: No Food Insecurity (03/15/2024)   Epic    Worried About Programme Researcher, Broadcasting/film/video in the Last Year: Never true    Ran Out of Food in the Last Year: Never true  Transportation Needs: Unmet Transportation  Needs (03/15/2024)   Epic    Lack of Transportation (Medical): Yes    Lack of Transportation (Non-Medical): No  Physical Activity: Sufficiently Active (04/01/2023)   Exercise Vital Sign    Days of Exercise per Week: 5 days    Minutes of Exercise per Session: 30 min  Stress: No Stress Concern Present (04/01/2023)   [employer] of Occupational Health - Occupational Stress Questionnaire    Feeling of Stress : Not at all  Social Connections: Moderately Integrated (03/15/2024)   Social Connection and Isolation Panel    Frequency of Communication with Friends and Family: More than three times a week    Frequency of Social Gatherings with Friends and Family: More than three times a week    Attends Religious Services: More than 4 times per year    Active Member of [employer] or Organizations: Yes    Attends Banker Meetings: 1 to 4 times per year    Marital Status: Widowed  Depression (PHQ2-9): Low Risk (04/01/2023)   Depression (PHQ2-9)    PHQ-2 Score: 0  Alcohol Screen: Not on file  Housing: Low Risk (03/15/2024)   Epic    Unable to Pay for Housing in the Last Year: No    Number of Times Moved in the Last Year: 0    Homeless in the Last Year: No  Utilities: Not At Risk (03/15/2024)   Epic    Threatened with loss of utilities: No  Health Literacy: Adequate Health Literacy (04/01/2023)   B1300 Health Literacy    Frequency of need for help with medical instructions: Never       Review of Systems see above; currently denies fever, headache, chest pain, dyspnea, abdominal/back pain, nausea, vomiting or bleeding; has occasional cough.  Vital Signs: BP 123/87 (BP Location: Left Arm)   Pulse 71   Temp 98.2 F (36.8 C) (Oral)   Resp 16   Ht 5' 10 (1.778 m)   Wt 132 lb 7.9 oz (60.1 kg)   SpO2 97%   BMI 19.01 kg/m   Advance Care Plan: No documents on file  Physical Exam patient awake, responds to questions okay but slightly drowsy.  Scleral icterus.  Family in room.  Chest  clear to auscultation bilaterally.  Heart with regular rate and rhythm.  Abdomen soft, positive bowel sounds, currently nontender.  No lower extremity edema.  Imaging: DG ERCP Result Date: 03/17/2024 CLINICAL DATA:  [id number] Surgery, elective [id number] EXAM: ERCP COMPARISON:  CT AP, 03/14/2024.  MRCP, 03/15/2024. FLUOROSCOPY: Exposure Index (as provided by the  fluoroscopic device): 50.1 mGy Kerma FINDINGS: Limited oblique planar images of the RIGHT upper quadrant obtained C-arm. Images demonstrating flexible endoscopy. No submitted images for retrograde cholangiogram., IMPRESSION: Fluoroscopic imaging for ERCP. No submitted images for retrograde cholangiogram For complete description of intra procedural findings, please see performing service dictation. Electronically Signed   By: Thom Hall M.D.   On: 03/17/2024 13:53   MR ABDOMEN MRCP W WO CONTRAST Result Date: 03/15/2024 EXAM: MRCP WITH AND WITHOUT IV CONTRAST 03/15/2024 04:41:16 PM TECHNIQUE: Multisequence, multiplanar magnetic resonance images of the abdomen with and without intravenous contrast. MRCP sequences were performed. Acquisition workstation 3D images were acquired by the technologist using existing protocol; radiologist available for consultation but did not designate specific images to acquire in this individual case. COMPARISON: CT abdomen 03/14/2024. CLINICAL HISTORY: Biliary ductal dilation and transaminitis. Suspected pancreatic head mass on recent CT scan. FINDINGS: LIVER: 8 mm focus of restricted diffusion just above the gallbladder fossa on image 24 series 8, not well corroborated on other imaging sequences, possibly artifact although a small subtle metastatic lesion cannot be totally excluded . GALLBLADDER AND BILIARY SYSTEM: Severe intrahepatic biliary dilatation and common bile duct dilatation up to 1.5 cm extending to a mass in the head of the pancreas. Mildly distended gallbladder with mild gallbladder wall thickening; strictly  speaking, cholecystitis is not excluded. SPLEEN: Unremarkable. PANCREAS/PANCREATIC DUCT: Indistinctly marginated 3.3 x 2.3 x 2.1 cm mass in the head of the pancreas demonstrating relative isoenhancement on early arterial phase images and mildly accentuated enhancement on delayed images and also demonstrating substantial restriction of diffusion as on image 28 series 8. The dorsopancreatic duct is likewise prominently dilated extending into this mass. I don't see definite involvement of the celiac trunk or superior mesenteric artery. No definite invasion or encirclement of the portal vein or superior mesenteric vein (SMV). ADRENAL GLANDS: Unremarkable. KIDNEYS: Unremarkable. LYMPH NODES: No enlarged abdominal lymph nodes. VASCULATURE: No definite involvement of the celiac trunk or superior mesenteric artery. No definite invasion or encirclement of the portal vein or superior mesenteric vein (SMV). PERITONEUM: No ascites. ABDOMINAL WALL: Left lumbar hernia on image 57 series 19 contains adipose tissue. No mass. BOWEL: Grossly unremarkable. No bowel obstruction. BONES: No acute abnormality or worrisome osseous lesion. SOFT TISSUES: Prior median sternotomy. Motion artifact is present, reducing diagnostic sensitivity and specificity. MISCELLANEOUS: Unremarkable. IMPRESSION: 1. 3.3 x 2.3 x 2.1 cm pancreatic head mass associated with severe dorsal pancreatic duct and biliary dilatation , high suspicion for pancreatic adenocarcinoma. Tissue diagnosis recommended. Although we are limited by spatial resolution and motion artifact, no definite involvement observed of the celiac trunk, superior mesenteric artery, portal vein, or superior mesenteric vein. The mass is very indistinctly marginated and poorly visible on most sequences but is more conspicuous on high b-value diffusion-weighted images. 2. 8 mm in diameter focus of restricted diffusion in the liver just above the gallbladder fossa on image 24, series 8, nonspecific,  although small metastatic lesion is difficult to exclude . 3. Mild gallbladder distention and mild gallbladder wall thickening; cholecystitis is not excluded. 4. Chronic and incidental findings include prior median sternotomy and a left lumbar hernia containing adipose tissue. Electronically signed by: Ryan Salvage MD MD 03/15/2024 05:11 PM EST RP Workstation: [id number]   MR 3D Recon At Scanner Result Date: 03/15/2024 EXAM: MRCP WITH AND WITHOUT IV CONTRAST 03/15/2024 04:41:16 PM TECHNIQUE: Multisequence, multiplanar magnetic resonance images of the abdomen with and without intravenous contrast. MRCP sequences were performed. Acquisition workstation 3D images were acquired by  the technologist using existing protocol; radiologist available for consultation but did not designate specific images to acquire in this individual case. COMPARISON: CT abdomen 03/14/2024. CLINICAL HISTORY: Biliary ductal dilation and transaminitis. Suspected pancreatic head mass on recent CT scan. FINDINGS: LIVER: 8 mm focus of restricted diffusion just above the gallbladder fossa on image 24 series 8, not well corroborated on other imaging sequences, possibly artifact although a small subtle metastatic lesion cannot be totally excluded . GALLBLADDER AND BILIARY SYSTEM: Severe intrahepatic biliary dilatation and common bile duct dilatation up to 1.5 cm extending to a mass in the head of the pancreas. Mildly distended gallbladder with mild gallbladder wall thickening; strictly speaking, cholecystitis is not excluded. SPLEEN: Unremarkable. PANCREAS/PANCREATIC DUCT: Indistinctly marginated 3.3 x 2.3 x 2.1 cm mass in the head of the pancreas demonstrating relative isoenhancement on early arterial phase images and mildly accentuated enhancement on delayed images and also demonstrating substantial restriction of diffusion as on image 28 series 8. The dorsopancreatic duct is likewise prominently dilated extending into this mass. I don't see  definite involvement of the celiac trunk or superior mesenteric artery. No definite invasion or encirclement of the portal vein or superior mesenteric vein (SMV). ADRENAL GLANDS: Unremarkable. KIDNEYS: Unremarkable. LYMPH NODES: No enlarged abdominal lymph nodes. VASCULATURE: No definite involvement of the celiac trunk or superior mesenteric artery. No definite invasion or encirclement of the portal vein or superior mesenteric vein (SMV). PERITONEUM: No ascites. ABDOMINAL WALL: Left lumbar hernia on image 57 series 19 contains adipose tissue. No mass. BOWEL: Grossly unremarkable. No bowel obstruction. BONES: No acute abnormality or worrisome osseous lesion. SOFT TISSUES: Prior median sternotomy. Motion artifact is present, reducing diagnostic sensitivity and specificity. MISCELLANEOUS: Unremarkable. IMPRESSION: 1. 3.3 x 2.3 x 2.1 cm pancreatic head mass associated with severe dorsal pancreatic duct and biliary dilatation , high suspicion for pancreatic adenocarcinoma. Tissue diagnosis recommended. Although we are limited by spatial resolution and motion artifact, no definite involvement observed of the celiac trunk, superior mesenteric artery, portal vein, or superior mesenteric vein. The mass is very indistinctly marginated and poorly visible on most sequences but is more conspicuous on high b-value diffusion-weighted images. 2. 8 mm in diameter focus of restricted diffusion in the liver just above the gallbladder fossa on image 24, series 8, nonspecific, although small metastatic lesion is difficult to exclude . 3. Mild gallbladder distention and mild gallbladder wall thickening; cholecystitis is not excluded. 4. Chronic and incidental findings include prior median sternotomy and a left lumbar hernia containing adipose tissue. Electronically signed by: Ryan Salvage MD MD 03/15/2024 05:11 PM EST RP Workstation: [id number]   CT ABDOMEN PELVIS WO CONTRAST Result Date: 03/14/2024 EXAM: CT ABDOMEN AND PELVIS  WITHOUT CONTRAST 03/14/2024 06:52:08 PM TECHNIQUE: CT of the abdomen and pelvis was performed without the administration of intravenous contrast. Multiplanar reformatted images are provided for review. Automated exposure control, iterative reconstruction, and/or weight-based adjustment of the mA/kV was utilized to reduce the radiation dose to as low as reasonably achievable. COMPARISON: None available. CLINICAL HISTORY: Abdominal pain, acute, nonlocalized; Hypotensive, elevated liver/bilirubin. Dizziness. Weight loss. FINDINGS: LOWER CHEST: Sternotomy wires. Mild dependent changes in the lung bases. LIVER: Diffuse intra- and extrahepatic bile duct dilatation. GALLBLADDER AND BILE DUCTS: The gallbladder is mildly distended. No gallstones are identified. Diffuse intra- and extrahepatic bile duct dilatation. SPLEEN: The spleen is normal. No nodules. PANCREAS: Pancreatic ductal dilatation with fullness in the head of the pancreas. Unenhanced evaluation is limited, but this likely represents a pancreatic mass measuring about 2.5 cm  in diameter. Correlate with MRCP or contrast-enhanced CT abdomen and pelvis for further evaluation. ADRENAL GLANDS: No acute abnormality. KIDNEYS, URETERS AND BLADDER: Kidneys are normal. No stones in the kidneys or ureters. No hydronephrosis. No perinephric or periureteral stranding. The bladder wall is mildly thickened, likely due to outlet obstruction. GI AND BOWEL: Stomach, small bowel, and colon are not abnormally distended. No discrete wall thickening is appreciated. There is no bowel obstruction. Small left inguinal hernia containing small bowel without proximal obstruction. PERITONEUM AND RETROPERITONEUM: No ascites. No free air. VASCULATURE: Aorta is normal in caliber. Calcification of the aorta. No aneurysm. LYMPH NODES: No lymphadenopathy. REPRODUCTIVE ORGANS: The prostate gland is enlarged. Measuring 6.5 cm in diameter. BONES AND SOFT TISSUES: Degenerative changes in the spine. No  focal bone lesions. Left lumbar triangle abdominal wall hernia containing fat. IMPRESSION: 1. Fullness in the head of the pancreas with associated pancreatic and bile duct dilatation, likely representing a pancreatic adenocarcinoma measuring about 2.5 cm in diameter; unenhanced evaluation is limited, and MRCP or contrast-enhanced CT abdomen and pelvis is recommended for further evaluation. Electronically signed by: Elsie Gravely MD 03/14/2024 07:22 PM EST RP Workstation: [id number]    Labs:  CBC: Recent Labs    12/21/23 1307 03/14/24 1730 03/16/24 0447 03/17/24 0433  WBC 5.6 6.1 5.7 6.5  HGB 11.2* 9.2* 8.4* 8.1*  HCT 33.3* 26.0* 23.1* 21.8*  PLT 178 258 249 238    COAGS: Recent Labs    03/14/24 1730 03/16/24 0447  INR 1.0 1.1    BMP: Recent Labs    03/14/24 1730 03/15/24 0947 03/16/24 0447 03/17/24 0433  NA 137 139 139 141  K 4.5 4.3 4.0 3.7  CL 103 109 108 109  CO2 21* 18* 20* 22  GLUCOSE 147* 127* 124* 89  BUN 27* 25* 22 16  CALCIUM  9.7 8.9 9.0 8.9  CREATININE 2.57* 2.75* 3.20* 3.09*  GFRNONAA 24* 22* 18* 19*    LIVER FUNCTION TESTS: Recent Labs    12/21/23 1307 03/14/24 1730 03/16/24 0447 03/17/24 0433  BILITOT 0.4 8.2* 7.4* 7.8*  AST 22 222* 255* 260*  ALT 17 285* 236* 228*  ALKPHOS 84 1,876* 1,691* 1,545*  PROT 6.8 6.7 5.7* 5.1*  ALBUMIN  3.5 3.3* 2.7* 2.6*    TUMOR MARKERS: No results for input(s): AFPTM, CEA, CA199, CHROMGRNA in the last 8760 hours.  Assessment and Plan: 84 y.o. male with past medical history significant for anemia, chronic kidney disease, coronary artery disease with prior CABG, cardiomyopathy, MGUS, hypertension, CHF, glaucoma, hyperlipidemia, osteoarthritis who was admitted to [facility] on 1/7 with weight loss, lightheadedness, low blood pressure, elevated liver function tests, obstructive jaundice with associated pancreatic head mass, biliary and pancreatic duct dilatation concerning for pancreatic  adenocarcinoma, mild gallbladder distention and mild gallbladder wall thickening.  Current lab studies reveal normal WBC, hemoglobin 8.1, platelets 238k, creatinine 3.09, total bilirubin 7.8, PT/INR normal, CA 19-9 2000.  Patient underwent failed ERCP today and request now received from GI team for percutaneous transhepatic cholangiogram with biliary drain placement.  Imaging studies have been reviewed by Dr. Hughes.  Details/risks of procedure, including but not limited to, internal bleeding, infection, injury to adjacent structures, worsening renal function, need for prolonged drainage discussed with patient/daughter with their understanding and consent.  Procedure tentatively scheduled for later today or tomorrow. Last dose of baby aspirin  yesterday.    Thank you for allowing our service to participate in [place] FORBES Finn 's care  Electronically Signed: D. Franky Rakers, PA-C  03/17/2024, 2:33 PM      I spent a total of  30 minutes   in face to face in clinical consultation, greater than 50% of which was counseling/coordinating care for image guided percutaneous transhepatic cholangiogram with biliary drain placement   "

## 2024-03-17 NOTE — Progress Notes (Signed)
" ° ° °  PROCEDURAL EXPEDITER PROGRESS NOTE  Patient Name: Luis Chen  DOB:[birth year] Date of Admission: 03/14/2024  Date of Assessment:03/17/2024   -------------------------------------------------------------------------------------------------------------------   Brief clinical summary: Came in for low BP, feeling dizzy, and abdominal pain. Hx of CKD3, CAD, MGUS, and HLD. GI consulted w/plan for ERCP.   Orders in place:  Yes   Communication with surgical team if no orders: N/A  Labs, test, and orders reviewed: yes  Requires surgical clearance:   No  What type of clearance: n/a  Clearance received: n/a  Barriers noted: None   Intervention provided by [facility] team: N/A  Barrier resolved: not applicable   -------------------------------------------------------------------------------------------------------------------  [employer], Rexene LITTIE Kirks Please contact us  directly via secure chat (search for [facility]) or by calling us  at [phone] [facility]).  "

## 2024-03-17 NOTE — Progress Notes (Signed)
 Patient returned from procedure, He is alert, oriented x4. dressing is clean dry and intact to right abdomen. Drain is in place to gravity. No complaints of pain at this time. Pt is drowsy but easy to arouse.

## 2024-03-17 NOTE — Sedation Documentation (Signed)
 RN Wenonah Milo pulled 2 mg Versed  and 100 mcg Fentanyl  in IR room pysix. Pt. Received 2 mg Versed  and 100 mcg Fentanyl  throughout the procedure. RN received 25 mg of demerol  from pharmacy pt. Received 25 mg of demerol  throughout the procedure.

## 2024-03-17 NOTE — Progress Notes (Signed)
 The patient has returned from procedure. No changes from am assessment.   03/17/24 1418  Vitals  Temp 98.2 F (36.8 C)  Temp Source Oral  BP 123/87  MAP (mmHg) 97  BP Location Left Arm  BP Method Automatic  Patient Position (if appropriate) Lying  Pulse Rate 71  Pulse Rate Source Monitor  Resp 16  MEWS COLOR  MEWS Score Color Green  Oxygen Therapy  SpO2 97 %  O2 Device Room Air  MEWS Score  MEWS Temp 0  MEWS Systolic 0  MEWS Pulse 0  MEWS RR 0  MEWS LOC 0  MEWS Score 0

## 2024-03-17 NOTE — Progress Notes (Addendum)
 " PROGRESS NOTE    Luis Chen  FMW:[id number] DOB: February 15, 1941 DOA: 03/14/2024 PCP: [name], MD  Subjective: Patient reports feeling okay, no significant change. Denied any abdominal pain, nausea or vomiting. Did not sleep well    Hospital Course: 84 year old male with PMH of CKD stage III, CAD, MGUS, HLD who presented with lightheadedness and low blood pressure, and weight loss over the last few weeks. Blood work in the ED showed elevated LFTs, significantly elevated alkaline phosphatase and bilirubin. CT abdomen/pelvis showed concern for pancreatic mass and patient was hospitalized for further management. Labs also showed AKI on CKD for which nephrology was consulted.    Assessment and Plan:  Obstructive jaundice due to pancreatic head mass - MRCP showed pancreatic head mass associated with severe dorsal pancreatic duct and biliary dilation - GI following and plan for ERCP today - f/u CA 19-9 levels  - monitor CMP daily  - continue zosyn  for now Update: failed ERCP today, IR was already consulted by GI for a perc biliary drain. Keep NPO, procedure to be done today vs tomorrow    AKI on chronic CKD - baseline Cr is around 1.5-1.8, presented with creatinine of 2.57 and peaked to 3.20. CT without any hydronephrosis but noted to bladder was mildly thickened likely from outlet obstruction  - nephro consulted, appreciate recommendations  - continue IVF fluids, getting higher rate of bicarb per nephro - strict I/Os - Cr improving to 3.09 today, monitor daily  - pending SPEP and serum FLC (given his h/o of MGUS)   Normocytic anemia - has h/o MGUS, no overt bleeding - Hb trending down to 8.1, monitor - transfuse for Hb<7   CAD - holding antiplatelets for now in anticipation of perc biliary drain    Glaucoma - continue eyedrops     DVT prophylaxis: SCDs Start: 03/15/24 1637    Code Status: Full Code Family Communication: Updated at bedside Disposition Plan: TBD Reason for  continuing need for hospitalization: needs perc drain   Objective: Vitals:   03/17/24 1335 03/17/24 1340 03/17/24 1345 03/17/24 1350  BP:  (!) 140/57  (!) 129/47  Pulse: 68 70 66 66  Resp: 12 17 13 16   Temp: 98.7 F (37.1 C)     TempSrc: Temporal     SpO2: 100% 98% 97% 97%  Weight:      Height:        Intake/Output Summary (Last 24 hours) at 03/17/2024 1407 Last data filed at 03/17/2024 1321 Gross per 24 hour  Intake 3692.39 ml  Output 650 ml  Net 3042.39 ml   Filed Weights   03/15/24 1000 03/15/24 1530  Weight: 65.2 kg 60.1 kg    Examination:  Physical Exam Vitals and nursing note reviewed.  Constitutional:      General: He is not in acute distress. Cardiovascular:     Rate and Rhythm: Normal rate.  Pulmonary:     Effort: No respiratory distress.  Abdominal:     General: There is no distension.     Tenderness: There is no abdominal tenderness.  Musculoskeletal:     Right lower leg: No edema.     Left lower leg: No edema.     Data Reviewed: I have personally reviewed following labs and imaging studies  CBC: Recent Labs  Lab 03/14/24 1730 03/16/24 0447 03/17/24 0433  WBC 6.1 5.7 6.5  NEUTROABS 3.9  --   --   HGB 9.2* 8.4* 8.1*  HCT 26.0* 23.1* 21.8*  MCV  88.7 85.9 84.5  PLT 258 249 238   Basic Metabolic Panel: Recent Labs  Lab 03/14/24 1730 03/15/24 0947 03/16/24 0447 03/17/24 0433  NA 137 139 139 141  K 4.5 4.3 4.0 3.7  CL 103 109 108 109  CO2 21* 18* 20* 22  GLUCOSE 147* 127* 124* 89  BUN 27* 25* 22 16  CREATININE 2.57* 2.75* 3.20* 3.09*  CALCIUM  9.7 8.9 9.0 8.9   GFR: Estimated Creatinine Clearance: 14.9 mL/min (A) (by C-G formula based on SCr of 3.09 mg/dL (H)). Liver Function Tests: Recent Labs  Lab 03/14/24 1730 03/16/24 0447 03/17/24 0433  AST 222* 255* 260*  ALT 285* 236* 228*  ALKPHOS 1,876* 1,691* 1,545*  BILITOT 8.2* 7.4* 7.8*  PROT 6.7 5.7* 5.1*  ALBUMIN  3.3* 2.7* 2.6*   Recent Labs  Lab 03/14/24 1850  LIPASE 62*    No results for input(s): AMMONIA in the last 168 hours. Coagulation Profile: Recent Labs  Lab 03/14/24 1730 03/16/24 0447  INR 1.0 1.1   Cardiac Enzymes: No results for input(s): CKTOTAL, CKMB, CKMBINDEX, TROPONINI in the last 168 hours. ProBNP, BNP (last 5 results) No results for input(s): PROBNP, BNP in the last 8760 hours. HbA1C: No results for input(s): HGBA1C in the last 72 hours. CBG: No results for input(s): GLUCAP in the last 168 hours. Lipid Profile: No results for input(s): CHOL, HDL, LDLCALC, TRIG, CHOLHDL, LDLDIRECT in the last 72 hours. Thyroid  Function Tests: Recent Labs    03/16/24 0447  TSH 0.838   Anemia Panel: Recent Labs    03/16/24 0447  VITAMINB12 2,605*  FOLATE 15.6  FERRITIN 2,730*  TIBC 213*  IRON  68  RETICCTPCT 1.4   Sepsis Labs: Recent Labs  Lab 03/14/24 1730  LATICACIDVEN 1.2    Recent Results (from the past 240 hours)  Culture, blood (routine x 2)     Status: None (Preliminary result)   Collection Time: 03/14/24  5:33 PM   Specimen: BLOOD  Result Value Ref Range Status   Specimen Description   Final    BLOOD LEFT ANTECUBITAL Performed at [facility], [address], [place], [place] [address]    Special Requests   Final    BOTTLES DRAWN AEROBIC AND ANAEROBIC Blood Culture adequate volume Performed at [facility], [address] [place], [place] [address]    Culture   Final    NO GROWTH 3 DAYS Performed at [facility], [address], [place], [place] [address]    Report Status PENDING  Incomplete  Culture, blood (routine x 2)     Status: None (Preliminary result)   Collection Time: 03/14/24  5:50 PM   Specimen: BLOOD RIGHT ARM  Result Value Ref Range Status   Specimen Description   Final    BLOOD RIGHT ARM Performed at [facility], [address], [place], [place] [address]    Special Requests   Final    BOTTLES DRAWN AEROBIC ONLY  Blood Culture adequate volume Performed at [facility], [address] [place], [place] [address]    Culture   Final    NO GROWTH 3 DAYS Performed at [facility], [address] [place], [place] [address]    Report Status PENDING  Incomplete     Radiology Studies: DG ERCP Result Date: 03/17/2024 CLINICAL DATA:  [id number] Surgery, elective [id number] EXAM: ERCP COMPARISON:  CT AP, 03/14/2024.  MRCP, 03/15/2024. FLUOROSCOPY: Exposure Index (as provided by the fluoroscopic device): 50.1 mGy  Kerma FINDINGS: Limited oblique planar images of the RIGHT upper quadrant obtained C-arm. Images demonstrating flexible endoscopy. No submitted images for retrograde cholangiogram., IMPRESSION: Fluoroscopic imaging for ERCP. No submitted images for retrograde cholangiogram For complete description of intra procedural findings, please see performing service dictation. Electronically Signed   By: Thom Hall M.D.   On: 03/17/2024 13:53   MR ABDOMEN MRCP W WO CONTRAST Result Date: 03/15/2024 EXAM: MRCP WITH AND WITHOUT IV CONTRAST 03/15/2024 04:41:16 PM TECHNIQUE: Multisequence, multiplanar magnetic resonance images of the abdomen with and without intravenous contrast. MRCP sequences were performed. Acquisition workstation 3D images were acquired by the technologist using existing protocol; radiologist available for consultation but did not designate specific images to acquire in this individual case. COMPARISON: CT abdomen 03/14/2024. CLINICAL HISTORY: Biliary ductal dilation and transaminitis. Suspected pancreatic head mass on recent CT scan. FINDINGS: LIVER: 8 mm focus of restricted diffusion just above the gallbladder fossa on image 24 series 8, not well corroborated on other imaging sequences, possibly artifact although a small subtle metastatic lesion cannot be totally excluded . GALLBLADDER AND BILIARY SYSTEM: Severe intrahepatic biliary dilatation and common bile duct dilatation up to 1.5 cm  extending to a mass in the head of the pancreas. Mildly distended gallbladder with mild gallbladder wall thickening; strictly speaking, cholecystitis is not excluded. SPLEEN: Unremarkable. PANCREAS/PANCREATIC DUCT: Indistinctly marginated 3.3 x 2.3 x 2.1 cm mass in the head of the pancreas demonstrating relative isoenhancement on early arterial phase images and mildly accentuated enhancement on delayed images and also demonstrating substantial restriction of diffusion as on image 28 series 8. The dorsopancreatic duct is likewise prominently dilated extending into this mass. I don't see definite involvement of the celiac trunk or superior mesenteric artery. No definite invasion or encirclement of the portal vein or superior mesenteric vein (SMV). ADRENAL GLANDS: Unremarkable. KIDNEYS: Unremarkable. LYMPH NODES: No enlarged abdominal lymph nodes. VASCULATURE: No definite involvement of the celiac trunk or superior mesenteric artery. No definite invasion or encirclement of the portal vein or superior mesenteric vein (SMV). PERITONEUM: No ascites. ABDOMINAL WALL: Left lumbar hernia on image 57 series 19 contains adipose tissue. No mass. BOWEL: Grossly unremarkable. No bowel obstruction. BONES: No acute abnormality or worrisome osseous lesion. SOFT TISSUES: Prior median sternotomy. Motion artifact is present, reducing diagnostic sensitivity and specificity. MISCELLANEOUS: Unremarkable. IMPRESSION: 1. 3.3 x 2.3 x 2.1 cm pancreatic head mass associated with severe dorsal pancreatic duct and biliary dilatation , high suspicion for pancreatic adenocarcinoma. Tissue diagnosis recommended. Although we are limited by spatial resolution and motion artifact, no definite involvement observed of the celiac trunk, superior mesenteric artery, portal vein, or superior mesenteric vein. The mass is very indistinctly marginated and poorly visible on most sequences but is more conspicuous on high b-value diffusion-weighted images. 2. 8 mm  in diameter focus of restricted diffusion in the liver just above the gallbladder fossa on image 24, series 8, nonspecific, although small metastatic lesion is difficult to exclude . 3. Mild gallbladder distention and mild gallbladder wall thickening; cholecystitis is not excluded. 4. Chronic and incidental findings include prior median sternotomy and a left lumbar hernia containing adipose tissue. Electronically signed by: Ryan Salvage MD MD 03/15/2024 05:11 PM EST RP Workstation: [id number]   MR 3D Recon At Scanner Result Date: 03/15/2024 EXAM: MRCP WITH AND WITHOUT IV CONTRAST 03/15/2024 04:41:16 PM TECHNIQUE: Multisequence, multiplanar magnetic resonance images of the abdomen with and without intravenous contrast. MRCP sequences were performed. Acquisition workstation 3D images were acquired by the technologist using existing  protocol; radiologist available for consultation but did not designate specific images to acquire in this individual case. COMPARISON: CT abdomen 03/14/2024. CLINICAL HISTORY: Biliary ductal dilation and transaminitis. Suspected pancreatic head mass on recent CT scan. FINDINGS: LIVER: 8 mm focus of restricted diffusion just above the gallbladder fossa on image 24 series 8, not well corroborated on other imaging sequences, possibly artifact although a small subtle metastatic lesion cannot be totally excluded . GALLBLADDER AND BILIARY SYSTEM: Severe intrahepatic biliary dilatation and common bile duct dilatation up to 1.5 cm extending to a mass in the head of the pancreas. Mildly distended gallbladder with mild gallbladder wall thickening; strictly speaking, cholecystitis is not excluded. SPLEEN: Unremarkable. PANCREAS/PANCREATIC DUCT: Indistinctly marginated 3.3 x 2.3 x 2.1 cm mass in the head of the pancreas demonstrating relative isoenhancement on early arterial phase images and mildly accentuated enhancement on delayed images and also demonstrating substantial restriction of  diffusion as on image 28 series 8. The dorsopancreatic duct is likewise prominently dilated extending into this mass. I don't see definite involvement of the celiac trunk or superior mesenteric artery. No definite invasion or encirclement of the portal vein or superior mesenteric vein (SMV). ADRENAL GLANDS: Unremarkable. KIDNEYS: Unremarkable. LYMPH NODES: No enlarged abdominal lymph nodes. VASCULATURE: No definite involvement of the celiac trunk or superior mesenteric artery. No definite invasion or encirclement of the portal vein or superior mesenteric vein (SMV). PERITONEUM: No ascites. ABDOMINAL WALL: Left lumbar hernia on image 57 series 19 contains adipose tissue. No mass. BOWEL: Grossly unremarkable. No bowel obstruction. BONES: No acute abnormality or worrisome osseous lesion. SOFT TISSUES: Prior median sternotomy. Motion artifact is present, reducing diagnostic sensitivity and specificity. MISCELLANEOUS: Unremarkable. IMPRESSION: 1. 3.3 x 2.3 x 2.1 cm pancreatic head mass associated with severe dorsal pancreatic duct and biliary dilatation , high suspicion for pancreatic adenocarcinoma. Tissue diagnosis recommended. Although we are limited by spatial resolution and motion artifact, no definite involvement observed of the celiac trunk, superior mesenteric artery, portal vein, or superior mesenteric vein. The mass is very indistinctly marginated and poorly visible on most sequences but is more conspicuous on high b-value diffusion-weighted images. 2. 8 mm in diameter focus of restricted diffusion in the liver just above the gallbladder fossa on image 24, series 8, nonspecific, although small metastatic lesion is difficult to exclude . 3. Mild gallbladder distention and mild gallbladder wall thickening; cholecystitis is not excluded. 4. Chronic and incidental findings include prior median sternotomy and a left lumbar hernia containing adipose tissue. Electronically signed by: Ryan Salvage MD MD 03/15/2024  05:11 PM EST RP Workstation: [id number]    Scheduled Meds:  diclofenac   100 mg Rectal Once   [MAR Hold] latanoprost   1 drop Both Eyes Daily   [MAR Hold] senna  1 tablet Oral BID   Continuous Infusions:  sodium chloride      [MAR Hold] piperacillin -tazobactam (ZOSYN )  IV 2.25 g (03/17/24 0408)   sodium bicarbonate  150 mEq in sterile water  1,150 mL infusion 125 mL/hr (03/17/24 1159)     LOS: 2 days   Time spent: 40 minutes  Casimer Dare, MD  [facility]  03/17/2024, 2:07 PM   "

## 2024-03-17 NOTE — Anesthesia Procedure Notes (Signed)
 Procedure Name: Intubation Date/Time: 03/17/2024 12:10 PM  Performed by: Metta Andrea NOVAK, CRNAPre-anesthesia Checklist: Patient identified, Emergency Drugs available, Suction available, Patient being monitored and Timeout performed Patient Re-evaluated:Patient Re-evaluated prior to induction Oxygen Delivery Method: Circle system utilized Preoxygenation: Pre-oxygenation with 100% oxygen Induction Type: IV induction Ventilation: Mask ventilation without difficulty Laryngoscope Size: Mac and 4 Grade View: Grade I Tube type: Oral Tube size: 7.5 mm Number of attempts: 1 Airway Equipment and Method: Stylet Placement Confirmation: ETT inserted through vocal cords under direct vision, positive ETCO2 and breath sounds checked- equal and bilateral Secured at: 22 cm Tube secured with: Tape Dental Injury: Teeth and Oropharynx as per pre-operative assessment

## 2024-03-17 NOTE — Transfer of Care (Signed)
 Immediate Anesthesia Transfer of Care Note  Patient: Luis Chen  Procedure(s) Performed: ERCP, WITH INTERVENTION IF INDICATED  Patient Location: PACU  Anesthesia Type:General  Level of Consciousness: awake, alert , and patient cooperative  Airway & Oxygen Therapy: Patient Spontanous Breathing and Patient connected to face mask oxygen  Post-op Assessment: Report given to RN and Post -op Vital signs reviewed and stable  Post vital signs: Reviewed and stable  Last Vitals:  Vitals Value Taken Time  BP 147/56 03/17/24 13:20  Temp    Pulse 73 03/17/24 13:20  Resp 16 03/17/24 13:20  SpO2 100 % 03/17/24 13:20  Vitals shown include unfiled device data.  Last Pain:  Vitals:   03/17/24 1039  TempSrc: Temporal  PainSc: 0-No pain      Patients Stated Pain Goal: 0 (03/15/24 2106)  Complications: No notable events documented.

## 2024-03-17 NOTE — Progress Notes (Addendum)
 "     Gastroenterology Progress Note  CC:  Elevated LFTs, pancreatic mass   Subjective: He did not sleep very well last night due to numerous interruptions and feeling hungry.  No nausea or vomiting.  No abdominal pain.  He passed a soft brown stool earlier this morning, no melena or rectal bleeding.  No chest pain or shortness of breath.  No family at the bedside at this time.   Objective:  Vital signs in last 24 hours: Temp:  [97.7 F (36.5 C)-98.3 F (36.8 C)] 98.3 F (36.8 C) (01/08 2135) Pulse Rate:  [60-73] 73 (01/08 2135) Resp:  [16-20] 19 (01/08 2135) BP: (116-130)/(60-71) 130/71 (01/08 2135) SpO2:  [100 %] 100 % (01/08 2135) Last BM Date : 03/16/24 General: Alert fatigued appearing 84 year old male in no acute distress. Heart: Regular rate and rhythm, no murmurs. Pulm: Breath sounds clear throughout.  On room air. Abdomen: Soft, nondistended.  Nontender.  Positive bowel sounds all 4 quadrants.  No palpable mass. Extremities: No lower extremity edema. Neurologic:  Alert and oriented x 4. Grossly normal neurologically. Psych:  Alert and cooperative. Normal mood and affect.  Intake/Output from previous day: 01/08 0701 - 01/09 0700 In: 2657.7 [P.O.:720; I.V.:1254.4; IV Piggyback:683.3] Out: 875 [Urine:875] Intake/Output this shift: No intake/output data recorded.  Lab Results: Recent Labs    03/14/24 1730 03/16/24 0447 03/17/24 0433  WBC 6.1 5.7 6.5  HGB 9.2* 8.4* 8.1*  HCT 26.0* 23.1* 21.8*  PLT 258 249 238   BMET Recent Labs    03/15/24 0947 03/16/24 0447 03/17/24 0433  NA 139 139 141  K 4.3 4.0 3.7  CL 109 108 109  CO2 18* 20* 22  GLUCOSE 127* 124* 89  BUN 25* 22 16  CREATININE 2.75* 3.20* 3.09*  CALCIUM  8.9 9.0 8.9   LFT Recent Labs    03/17/24 0433  PROT 5.1*  ALBUMIN  2.6*  AST 260*  ALT 228*  ALKPHOS 1,545*  BILITOT 7.8*  BILIDIR 6.2*  IBILI 1.6*   PT/INR Recent Labs    03/14/24 1730 03/16/24 0447  LABPROT 13.3 14.7   INR 1.0 1.1   Hepatitis Panel No results for input(s): HEPBSAG, HCVAB, HEPAIGM, HEPBIGM in the last 72 hours.  MR ABDOMEN MRCP W WO CONTRAST Result Date: 03/15/2024 EXAM: MRCP WITH AND WITHOUT IV CONTRAST 03/15/2024 04:41:16 PM TECHNIQUE: Multisequence, multiplanar magnetic resonance images of the abdomen with and without intravenous contrast. MRCP sequences were performed. Acquisition workstation 3D images were acquired by the technologist using existing protocol; radiologist available for consultation but did not designate specific images to acquire in this individual case. COMPARISON: CT abdomen 03/14/2024. CLINICAL HISTORY: Biliary ductal dilation and transaminitis. Suspected pancreatic head mass on recent CT scan. FINDINGS: LIVER: 8 mm focus of restricted diffusion just above the gallbladder fossa on image 24 series 8, not well corroborated on other imaging sequences, possibly artifact although a small subtle metastatic lesion cannot be totally excluded . GALLBLADDER AND BILIARY SYSTEM: Severe intrahepatic biliary dilatation and common bile duct dilatation up to 1.5 cm extending to a mass in the head of the pancreas. Mildly distended gallbladder with mild gallbladder wall thickening; strictly speaking, cholecystitis is not excluded. SPLEEN: Unremarkable. PANCREAS/PANCREATIC DUCT: Indistinctly marginated 3.3 x 2.3 x 2.1 cm mass in the head of the pancreas demonstrating relative isoenhancement on early arterial phase images and mildly accentuated enhancement on delayed images and also demonstrating substantial restriction of diffusion as on image 28 series 8. The dorsopancreatic duct is likewise prominently dilated  extending into this mass. I don't see definite involvement of the celiac trunk or superior mesenteric artery. No definite invasion or encirclement of the portal vein or superior mesenteric vein (SMV). ADRENAL GLANDS: Unremarkable. KIDNEYS: Unremarkable. LYMPH NODES: No enlarged abdominal  lymph nodes. VASCULATURE: No definite involvement of the celiac trunk or superior mesenteric artery. No definite invasion or encirclement of the portal vein or superior mesenteric vein (SMV). PERITONEUM: No ascites. ABDOMINAL WALL: Left lumbar hernia on image 57 series 19 contains adipose tissue. No mass. BOWEL: Grossly unremarkable. No bowel obstruction. BONES: No acute abnormality or worrisome osseous lesion. SOFT TISSUES: Prior median sternotomy. Motion artifact is present, reducing diagnostic sensitivity and specificity. MISCELLANEOUS: Unremarkable. IMPRESSION: 1. 3.3 x 2.3 x 2.1 cm pancreatic head mass associated with severe dorsal pancreatic duct and biliary dilatation , high suspicion for pancreatic adenocarcinoma. Tissue diagnosis recommended. Although we are limited by spatial resolution and motion artifact, no definite involvement observed of the celiac trunk, superior mesenteric artery, portal vein, or superior mesenteric vein. The mass is very indistinctly marginated and poorly visible on most sequences but is more conspicuous on high b-value diffusion-weighted images. 2. 8 mm in diameter focus of restricted diffusion in the liver just above the gallbladder fossa on image 24, series 8, nonspecific, although small metastatic lesion is difficult to exclude . 3. Mild gallbladder distention and mild gallbladder wall thickening; cholecystitis is not excluded. 4. Chronic and incidental findings include prior median sternotomy and a left lumbar hernia containing adipose tissue. Electronically signed by: Ryan Salvage MD MD 03/15/2024 05:11 PM EST RP Workstation: [id number]   MR 3D Recon At Scanner Result Date: 03/15/2024 EXAM: MRCP WITH AND WITHOUT IV CONTRAST 03/15/2024 04:41:16 PM TECHNIQUE: Multisequence, multiplanar magnetic resonance images of the abdomen with and without intravenous contrast. MRCP sequences were performed. Acquisition workstation 3D images were acquired by the technologist using  existing protocol; radiologist available for consultation but did not designate specific images to acquire in this individual case. COMPARISON: CT abdomen 03/14/2024. CLINICAL HISTORY: Biliary ductal dilation and transaminitis. Suspected pancreatic head mass on recent CT scan. FINDINGS: LIVER: 8 mm focus of restricted diffusion just above the gallbladder fossa on image 24 series 8, not well corroborated on other imaging sequences, possibly artifact although a small subtle metastatic lesion cannot be totally excluded . GALLBLADDER AND BILIARY SYSTEM: Severe intrahepatic biliary dilatation and common bile duct dilatation up to 1.5 cm extending to a mass in the head of the pancreas. Mildly distended gallbladder with mild gallbladder wall thickening; strictly speaking, cholecystitis is not excluded. SPLEEN: Unremarkable. PANCREAS/PANCREATIC DUCT: Indistinctly marginated 3.3 x 2.3 x 2.1 cm mass in the head of the pancreas demonstrating relative isoenhancement on early arterial phase images and mildly accentuated enhancement on delayed images and also demonstrating substantial restriction of diffusion as on image 28 series 8. The dorsopancreatic duct is likewise prominently dilated extending into this mass. I don't see definite involvement of the celiac trunk or superior mesenteric artery. No definite invasion or encirclement of the portal vein or superior mesenteric vein (SMV). ADRENAL GLANDS: Unremarkable. KIDNEYS: Unremarkable. LYMPH NODES: No enlarged abdominal lymph nodes. VASCULATURE: No definite involvement of the celiac trunk or superior mesenteric artery. No definite invasion or encirclement of the portal vein or superior mesenteric vein (SMV). PERITONEUM: No ascites. ABDOMINAL WALL: Left lumbar hernia on image 57 series 19 contains adipose tissue. No mass. BOWEL: Grossly unremarkable. No bowel obstruction. BONES: No acute abnormality or worrisome osseous lesion. SOFT TISSUES: Prior median sternotomy. Motion  artifact is  present, reducing diagnostic sensitivity and specificity. MISCELLANEOUS: Unremarkable. IMPRESSION: 1. 3.3 x 2.3 x 2.1 cm pancreatic head mass associated with severe dorsal pancreatic duct and biliary dilatation , high suspicion for pancreatic adenocarcinoma. Tissue diagnosis recommended. Although we are limited by spatial resolution and motion artifact, no definite involvement observed of the celiac trunk, superior mesenteric artery, portal vein, or superior mesenteric vein. The mass is very indistinctly marginated and poorly visible on most sequences but is more conspicuous on high b-value diffusion-weighted images. 2. 8 mm in diameter focus of restricted diffusion in the liver just above the gallbladder fossa on image 24, series 8, nonspecific, although small metastatic lesion is difficult to exclude . 3. Mild gallbladder distention and mild gallbladder wall thickening; cholecystitis is not excluded. 4. Chronic and incidental findings include prior median sternotomy and a left lumbar hernia containing adipose tissue. Electronically signed by: Ryan Salvage MD MD 03/15/2024 05:11 PM EST RP Workstation: [id number]    Assessment / Plan:   84 year old male admitted 03/15/2024 for further evaluation regarding weight loss, elevated LFTs, obstructive jaundice with associated pancreatic mass, biliary and pancreatic duct dilatation concerning for pancreatic adenocarcinoma. CTAP showed fullness in the head of the pancreas  measuring 2.5 cm in diameter with associated pancreatic and bile duct dilatation. Abdominal MRI/MRCP W/WO contrast identified a 3.3 x 2.3 x 2.1 cm pancreatic head mass associated with several dorsal pancreatic duct and biliary dilatation, mild gallbladder distention and mild gallbladder wall thickening. T. Bili 8.2 -> 7.4 -> 7.8. Direct bili 6.2. Alk phos 1,876 -> 1,691 -> 1545. AST 222 -> 255 -> 260. ALT 285 -> 236 -> 228.  INR 1.1 on 1/8. On Zosyn  IV. No leukocytosis. - No vital  signs yet reported for today.  Dayshift RN to obtain vital signs at this time. - NPO  -IV fluids per the hospitalist - Patient to proceed with ERCP with Dr. Abran today - Await Ca 19-9 results   MGUS with normocytic anemia. Hg 9.2 (Hg 11.2 two months ago) -> 8.4 -> today Hg 8.1. Iron  68. TIBC 213.  On oral iron  at home.  No overt GI bleeding.  - Transfuse for Hg level < 8    AKI on CKD. Cr 2.57 -> 2.75 -> 3.20 -> 3.09.  GFR 19. IVFs at 75 cc/hr. - Management per the hospitalist  - Recommend renal consult    Coronary artery disease s/p 3 vessel CABG 02/2018.  No angina.   Nonischemic cardiomyopathy. LV EF 50 - 55% per ECHO 05/2021.  Principal Problem:   Obstructive jaundice (HCC) Active Problems:   Anemia, chronic disease   Chronic kidney disease (CKD), stage III (moderate) (HCC)   MGUS (monoclonal gammopathy of unknown significance)   Coronary artery disease involving native coronary artery of native heart with other form of angina pectoris   Transaminitis   Pancreatic mass   Elevated bilirubin   Abnormal CT of the abdomen     LOS: 2 days   Elida HERO Kennedy-Smith  03/17/2024, 8:11 AM  GI ATTENDING  Interval history data reviewed.  Patient personally seen and examined in the preprocedure area of the endoscopy suite.  He is accompanied by his daughter.  Agree with interval progress note as outlined above.  He is for ERCP with sphincterotomy and stent placement for likely malignant obstructive jaundice secondary to pancreatic cancer.The nature of the procedure, as well as the risks, benefits, and alternatives were carefully and thoroughly reviewed with the patient. Ample time for discussion and questions allowed. The  patient understood, was satisfied, and agreed to proceed.  Norleen SAILOR. Abran Raddle., M.D. [facility] Division of Gastroenterology    "

## 2024-03-18 DIAGNOSIS — C250 Malignant neoplasm of head of pancreas: Secondary | ICD-10-CM | POA: Diagnosis not present

## 2024-03-18 DIAGNOSIS — K831 Obstruction of bile duct: Secondary | ICD-10-CM | POA: Diagnosis not present

## 2024-03-18 LAB — COMPREHENSIVE METABOLIC PANEL WITH GFR
ALT: 227 U/L — ABNORMAL HIGH (ref 0–44)
AST: 234 U/L — ABNORMAL HIGH (ref 15–41)
Albumin: 2.6 g/dL — ABNORMAL LOW (ref 3.5–5.0)
Alkaline Phosphatase: 1560 U/L — ABNORMAL HIGH (ref 38–126)
Anion gap: 10 (ref 5–15)
BUN: 24 mg/dL — ABNORMAL HIGH (ref 8–23)
CO2: 29 mmol/L (ref 22–32)
Calcium: 8.8 mg/dL — ABNORMAL LOW (ref 8.9–10.3)
Chloride: 101 mmol/L (ref 98–111)
Creatinine, Ser: 3.11 mg/dL — ABNORMAL HIGH (ref 0.61–1.24)
GFR, Estimated: 19 mL/min — ABNORMAL LOW
Glucose, Bld: 224 mg/dL — ABNORMAL HIGH (ref 70–99)
Potassium: 4 mmol/L (ref 3.5–5.1)
Sodium: 139 mmol/L (ref 135–145)
Total Bilirubin: 4.9 mg/dL — ABNORMAL HIGH (ref 0.0–1.2)
Total Protein: 5.7 g/dL — ABNORMAL LOW (ref 6.5–8.1)

## 2024-03-18 LAB — HEMOGLOBIN A1C
Hgb A1c MFr Bld: 6.2 % — ABNORMAL HIGH (ref 4.8–5.6)
Mean Plasma Glucose: 131.24 mg/dL

## 2024-03-18 LAB — CBC
HCT: 23.3 % — ABNORMAL LOW (ref 39.0–52.0)
Hemoglobin: 8.7 g/dL — ABNORMAL LOW (ref 13.0–17.0)
MCH: 31.4 pg (ref 26.0–34.0)
MCHC: 37.3 g/dL — ABNORMAL HIGH (ref 30.0–36.0)
MCV: 84.1 fL (ref 80.0–100.0)
Platelets: 244 K/uL (ref 150–400)
RBC: 2.77 MIL/uL — ABNORMAL LOW (ref 4.22–5.81)
RDW: 18 % — ABNORMAL HIGH (ref 11.5–15.5)
WBC: 5.3 K/uL (ref 4.0–10.5)
nRBC: 0 % (ref 0.0–0.2)

## 2024-03-18 MED ORDER — LACTATED RINGERS IV SOLN: INTRAVENOUS | Status: AC

## 2024-03-18 MED ORDER — MENTHOL 3 MG MT LOZG
1.0000 | LOZENGE | OROMUCOSAL | Status: DC | PRN
  Filled 2024-03-18 (×2): qty 9

## 2024-03-18 NOTE — Progress Notes (Signed)
 " PROGRESS NOTE Luis Chen  FMW:[id number] DOB: Feb 04, 1941 DOA: 03/14/2024 PCP: [name], MD  Brief Narrative/Hospital Course: Luis Chen is a 84 y.o. male with PMH of CKD stage III, CAD, MGUS, HLD who presented with lightheadedness and low blood pressure, and weight loss over the last few weeks. Blood work in the ED showed elevated LFTs, significantly elevated alkaline phosphatase and bilirubin. CT abdomen/pelvis showed concern for pancreatic mass , labs showed AKI.  Gastroenterology and nephrology IR has been consulted.   S/p ERCP but unable to pass guidewire, now s/p perc biliary drain by IR.   Subjective: Seen and examined today Resting comfortably, some discomfort in the abdomen, son at the bedside Overnight afebrile BP fairly stable, on room air, VSS, Labs-creatinine about the same 3.1 from 3.0 alk phos AST ALT remains elevated total bili down 7.8> 4.9, CBC stable hemoglobin ~ 8  Assessment and plan:   Obstructive jaundice due to pancreatic head mass: MRCP-pancreatic head mass associated with severe dorsal pancreatic duct and biliary dilation-GI consulted underwent ERCP-unable to pass guidewire, now s/p perc biliary drain by IR. CA 19-9 levels elevated 2010, cytology pending.  Biliary drain output 550 cc Remains afebrile LFTs remains elevated, T. bili now improving, continue on Zosyn , IV fluid hydration Appreciate GI follow-up and management  AKI on CKD 3a: B/l creat~1.3-1.5 in 2024 and October 2025.  Currently remains elevated nephrology following CT showed no hydro and bladder thickening possibly due to bladder outlet obstruction likely volume depletion and bladder outlet obstruction contributed.  Continue Foley, monitor output. Continue sodium bicarb drip per nephro Recent Labs    06/15/23 1116 12/21/23 1307 03/14/24 1730 03/15/24 0947 03/16/24 0447 03/17/24 0433 03/18/24 0425  BUN 17 20 27* 25* 22 16 24*  CREATININE 1.31* 1.38* 2.57* 2.75* 3.20* 3.09* 3.11*  CO2  23 27 21* 18* 20* 22 29  K 4.4 4.4 4.5 4.3 4.0 3.7 4.0    Normocytic anemia: Remains stable transfuse if less than 7 g suspect in the setting of #1  CAD: Antiplatelets on hold resume once okay postprocedure by GI or IR  Glaucoma: Continue eyedrops  Hyperglycemia: CBG more than 200, check A1c  Moderate malnutrition with Weight loss, low BMI Body mass index is 19.36 kg/m.: Augment diet as able.    Mobility: PT Orders: Active PT Follow up Rec: No Pt Follow Up1/10/2024 1354   DVT prophylaxis: SCDs Start: 03/15/24 1637 Code Status:   Code Status: Full Code Family Communication: plan of care discussed with patient and son at the bedside. Patient status is: Remains hospitalized because of severity of illness Level of care: Telemetry   Dispo: The patient is from: home            Anticipated disposition: TBD Objective: Vitals last 24 hrs: Vitals:   03/18/24 0040 03/18/24 0328 03/18/24 0452 03/18/24 0510  BP: (!) 125/58  (!) 112/52   Pulse: (!) 57  65   Resp: 18 18 18  (!) 21  Temp: (!) 97.5 F (36.4 C)     TempSrc: Oral     SpO2: 98%  99%   Weight:    61.2 kg  Height:       Physical Examination: General exam: AAOX3 HEENT:Oral mucosa moist, Ear/Nose WNL grossly Respiratory system: Bilaterally clear BS,no use of accessory muscle Cardiovascular system: S1 & S2 +, No JVD. Gastrointestinal system: Abdomen soft, tender,ND, BS+ Nervous System: Alert, awake, moving all extremities,and following commands. Extremities: extremities warm, leg edema neg Skin: Warm, no  rashes MSK: Normal muscle bulk,tone, power   Medications reviewed:  Scheduled Meds:  Chlorhexidine  Gluconate Cloth  6 each Topical Daily   diclofenac   100 mg Rectal Once   latanoprost   1 drop Both Eyes Daily   lidocaine -EPINEPHrine   20 mL Intradermal Once   meperidine  (DEMEROL ) injection  25 mg Intravenous Once   senna  1 tablet Oral BID   sodium chloride  flush  5 mL Intracatheter Q8H   Continuous Infusions:   cefTRIAXone  (ROCEPHIN )  IV     piperacillin -tazobactam (ZOSYN )  IV 2.25 g (03/18/24 0447)   sodium bicarbonate  150 mEq in sterile water  1,150 mL infusion 100 mL/hr at 03/18/24 0445   Diet: Diet Order             Diet Heart Room service appropriate? Yes; Fluid consistency: Thin  Diet effective now                   Data Reviewed: I have personally reviewed following labs and imaging studies ( see epic result tab) CBC: Recent Labs  Lab 03/14/24 1730 03/16/24 0447 03/17/24 0433 03/18/24 0425  WBC 6.1 5.7 6.5 5.3  NEUTROABS 3.9  --   --   --   HGB 9.2* 8.4* 8.1* 8.7*  HCT 26.0* 23.1* 21.8* 23.3*  MCV 88.7 85.9 84.5 84.1  PLT 258 249 238 244   CMP: Recent Labs  Lab 03/14/24 1730 03/15/24 0947 03/16/24 0447 03/17/24 0433 03/18/24 0425  NA 137 139 139 141 139  K 4.5 4.3 4.0 3.7 4.0  CL 103 109 108 109 101  CO2 21* 18* 20* 22 29  GLUCOSE 147* 127* 124* 89 224*  BUN 27* 25* 22 16 24*  CREATININE 2.57* 2.75* 3.20* 3.09* 3.11*  CALCIUM  9.7 8.9 9.0 8.9 8.8*   GFR: Estimated Creatinine Clearance: 15 mL/min (A) (by C-G formula based on SCr of 3.11 mg/dL (H)). Recent Labs  Lab 03/14/24 1730 03/16/24 0447 03/17/24 0433 03/18/24 0425  AST 222* 255* 260* 234*  ALT 285* 236* 228* 227*  ALKPHOS 1,876* 1,691* 1,545* 1,560*  BILITOT 8.2* 7.4* 7.8* 4.9*  PROT 6.7 5.7* 5.1* 5.7*  ALBUMIN  3.3* 2.7* 2.6* 2.6*    Recent Labs  Lab 03/14/24 1850  LIPASE 62*   No results for input(s): AMMONIA in the last 168 hours. Coagulation Profile:  Recent Labs  Lab 03/14/24 1730 03/16/24 0447  INR 1.0 1.1   Unresulted Labs (From admission, onward)     Start     Ordered   03/18/24 0819  Hemoglobin A1c  Add-on,   AD        03/18/24 0819   03/18/24 0500  Renal function panel  Daily,   R      03/17/24 1400   03/16/24 1719  Protein electrophoresis, serum  Once,   R        03/16/24 1718           Antimicrobials/Microbiology: Anti-infectives (From admission, onward)     Start     Dose/Rate Route Frequency Ordered Stop   03/17/24 1615  cefTRIAXone  (ROCEPHIN ) 2 g in sodium chloride  0.9 % 100 mL IVPB        2 g 200 mL/hr over 30 Minutes Intravenous  Once 03/17/24 1517     03/15/24 2000  piperacillin -tazobactam (ZOSYN ) IVPB 2.25 g        2.25 g 100 mL/hr over 30 Minutes Intravenous Every 8 hours 03/15/24 1555     03/15/24 0800  piperacillin -tazobactam (ZOSYN ) IVPB 3.375  g  Status:  Discontinued        3.375 g 12.5 mL/hr over 240 Minutes Intravenous Every 12 hours 03/15/24 0520 03/15/24 1555   03/15/24 0600  piperacillin -tazobactam (ZOSYN ) IVPB 2.25 g  Status:  Discontinued       Placed in Followed by Linked Group   2.25 g 100 mL/hr over 30 Minutes Intravenous Every 6 hours 03/14/24 2217 03/15/24 0519   03/14/24 2230  piperacillin -tazobactam (ZOSYN ) IVPB 3.375 g       Placed in Followed by Linked Group   3.375 g 100 mL/hr over 30 Minutes Intravenous  Once 03/14/24 2217 03/15/24 9394         Component Value Date/Time   SDES  03/17/2024 1758    BILE Performed at [facility], [address] [place], [place] [address]    SPECREQUEST  03/17/2024 1758    NONE Performed at [facility], [address] [place], [place] [address]    CULT  03/17/2024 1758    NO GROWTH < 12 HOURS Performed at [facility], [address], [place], [place] [address]    REPTSTATUS PENDING 03/17/2024 1758    Procedures: Procedures (LRB): ERCP, WITH INTERVENTION IF INDICATED (N/A)   Mennie LAMY, MD [facility] 03/18/2024, 11:59 AM   "

## 2024-03-18 NOTE — Progress Notes (Signed)
 HISTORY OF PRESENT ILLNESS:  Luis Chen is a 84 y.o. male admitted with obstructive jaundice felt secondary to pancreatic cancer.  Failed ERCP.  Status post PTC with drain.  Stable without complaints  Liver test improving  REVIEW OF SYSTEMS:  All non-GI ROS negative.   Past Medical History:  Diagnosis Date   Anemia, chronic disease 07/01/2015   Angina decubitus    Benign essential hypertension 10/31/2008   Qualifier: Diagnosis of  By: Lavona, MD, CODY Agent     CAD (coronary artery disease)    non obstructive. Left main normal. LAd proximal long 25% stenosis, termingating as focal 50% prox to mid lesion. First & second diag were small, normal. Circumflex in proximal av groove had luminal irregularities.. Was large mid obtuse marg which was branching, scattered luminal irregul. Infer branch did supply some septal perforators. Was PDA,small & normal.  < 1.71mm. There was prox 95% stenosis   CARDIOMYOPATHY 11/27/2008   CKD (chronic kidney disease)    Hyperlipidemia    transient history, controlled on diet   Hypertension    Mixed hyperlipidemia 10/30/2008   Qualifier: Diagnosis of  By: Leana, CNA, Luis Chen     Non-traumatic rhabdomyolysis 07/03/2015   Polyarthropathy 07/01/2015   Primary osteoarthritis of knees, bilateral 12/10/2014   Pyogenic arthritis of left knee joint (HCC)    Swelling of left knee joint 07/01/2015   Systolic HF (heart failure) (HCC) 02/08/2018    Past Surgical History:  Procedure Laterality Date   CORONARY ARTERY BYPASS GRAFT N/A 02/17/2018   Procedure: CORONARY ARTERY BYPASS GRAFTING (CABG);  Surgeon: Luis Dorise POUR, MD;  Location: [facility] OR;  Service: Open Heart Surgery;  Laterality: N/A;  Times 3 using endoscopically harvested right saphenous vein.     IR INT EXT BILIARY DRAIN WITH CHOLANGIOGRAM  03/17/2024   KNEE ARTHROSCOPY Left 07/01/2015   Procedure: Irrigation and debridement left knee arthroscopy;  Surgeon: Luis JONETTA Chancy, MD;  Location: [facility] OR;  Service:  Orthopedics;  Laterality: Left;   RIGHT/LEFT HEART CATH AND CORONARY ANGIOGRAPHY N/A 02/08/2018   Procedure: RIGHT/LEFT HEART CATH AND CORONARY ANGIOGRAPHY;  Surgeon: Luis Candyce RAMAN, MD;  Location: [facility] INVASIVE CV LAB;  Service: Cardiovascular;  Laterality: N/A;   TEE WITHOUT CARDIOVERSION N/A 02/17/2018   Procedure: TRANSESOPHAGEAL ECHOCARDIOGRAM (TEE);  Surgeon: Luis Dorise POUR, MD;  Location: [facility] OR;  Service: Open Heart Surgery;  Laterality: N/A;    Social History Luis Chen  reports that he has never smoked. He has never used smokeless tobacco. He reports current alcohol use. He reports that he does not use drugs.  family history includes Arthritis in his mother; Diabetes in his mother; Heart attack (age of onset: 81) in his mother; Heart disease in his brother and mother; Hyperlipidemia in his mother; Hypertension in his mother; Hypothyroidism in his daughter; Stroke in his daughter.  Allergies[1]     PHYSICAL EXAMINATION: Vital signs: BP (!) 129/58 (BP Location: Left Arm)   Pulse 62   Temp 98.9 F (37.2 C) (Oral)   Resp 18   Ht 5' 10 (1.778 m)   Wt 61.2 kg   SpO2 99%   BMI 19.36 kg/m  General: Thin, no acute distress HEENT: Sclerae icteric Abdomen: soft, nontender.  PTC in place. Extremities: No edema Psychiatric: alert and oriented x3. Cooperative   ASSESSMENT:  1.  Obstructive jaundice secondary to pancreatic cancer 2.  Failed ERCP 3.  Status post successful PTC with biliary drain   PLAN:  1.  Continue to  monitor liver test 2.  Monitoring of biliary drain per IR 3.  Follow-up cytology and brushings 4.  If brushings and cytology negative, could consider EUS if he is a candidate for palliative oncologic care Will follow.         [1]  Allergies Allergen Reactions   Ace Inhibitors Swelling and Other (See Comments)    Facial swelling    Entresto  [Sacubitril-Valsartan] Swelling and Other (See Comments)    Angiotensin Receptor Neprilysin  Inhibitor

## 2024-03-18 NOTE — Progress Notes (Signed)
 "   Referring Physician(s): Elida Berber - Claudene NP  Supervising Physician: Karalee Beat  Patient Status:  [facility] - In-pt  Chief Complaint:  Biliary obstruction with bilary ductal dialtion s/p right sided internal external biliary drain placement  Subjective:  Patient states that overall he is feeling better. Daughter at bedside  Allergies: Ace inhibitors and Entresto  [sacubitril-valsartan]  Medications: Prior to Admission medications  Medication Sig Start Date End Date Taking? Authorizing Provider  aspirin  81 MG EC tablet Take 1 tablet (81 mg total) by mouth daily. 07/28/19  Yes Meng, Hao, PA  carvedilol  (COREG ) 6.25 MG tablet Take 0.5 tablets (3.125 mg total) by mouth 2 (two) times daily with a meal. 05/28/20  Yes [name], MD  Cholecalciferol (VITAMIN D3) 1000 units CAPS Take 1,000 Units by mouth daily.   Yes [provider]  diclofenac  Sodium (VOLTAREN) 1 % GEL 2 g 4 (four) times daily as needed (for pain- left knee and any other affected area). 03/08/23  Yes [provider]  ezetimibe  (ZETIA ) 10 MG tablet Take 10 mg by mouth daily.   Yes [provider]  Ferrous Gluconate  324 (37.5 Fe) MG TABS TAKE 1 TABLET BY MOUTH TWICE DAILY WITH MEALS Patient taking differently: Take 324 mg by mouth See admin instructions. Take 324 mg by mouth with food on Mon/Wed/Fri 02/08/18  Yes Shadad, Firas N, MD  KETOTIFEN FUMARATE OP Place 1 drop into both eyes See admin instructions. Ketotifen 0.025% eye drops - Instill 1 drop into both eyes one to two times a day   Yes [provider]  latanoprost  (XALATAN ) 0.005 % ophthalmic solution Place 1 drop into both eyes at bedtime. 01/22/17  Yes [provider]  losartan  (COZAAR ) 50 MG tablet Take 50 mg by mouth 2 (two) times daily.   Yes [provider]  Nutritional Supplements (SUPLENA/CARB STEADY) LIQD Take 237 mLs by mouth See admin instructions. Drink 237 ml's of Vanilla Suplena by mouth  three times a day   Yes [provider]  rosuvastatin  (CRESTOR ) 40 MG tablet TAKE 1 TABLET BY MOUTH ONCE DAILY AT  6PM Patient taking differently: Take 40 mg by mouth at bedtime. 11/07/18  Yes [name], MD  traZODone  (DESYREL ) 150 MG tablet Take 150 mg by mouth at bedtime as needed for sleep.    Yes [provider]  trolamine salicylate (ASPERCREME) 10 % cream Apply 1 Application topically as needed (for left knee pain and/or other affected areas).   Yes [provider]  ezetimibe  (ZETIA ) 10 MG tablet Take 1 tablet (10 mg total) by mouth daily. Patient not taking: Reported on 03/15/2024 12/05/18 03/15/24  Rolan Ezra RAMAN, MD  oxyCODONE -acetaminophen  (PERCOCET/ROXICET) 5-325 MG tablet Take 1 tablet by mouth every 6 (six) hours as needed for severe pain (pain score 7-10). Patient not taking: Reported on 03/15/2024 03/09/23   Henderly, Britni A, PA-C  spironolactone  (ALDACTONE ) 25 MG tablet Take 1 tablet (25 mg total) by mouth daily. Patient not taking: Reported on 03/15/2024 12/06/18 03/15/24  Rolan Ezra RAMAN, MD     Vital Signs: BP (!) 129/58 (BP Location: Left Arm)   Pulse 62   Temp 98.9 F (37.2 C) (Oral)   Resp 18   Ht 5' 10 (1.778 m)   Wt 134 lb 14.4 oz (61.2 kg)   SpO2 99%   BMI 19.36 kg/m   Physical Exam Vitals and nursing note reviewed.  Constitutional:      Appearance: He is well-developed.  HENT:  Head: Normocephalic.  Pulmonary:     Effort: Pulmonary effort is normal.  Abdominal:     Comments: Positive RUQ  drain to  gravity bag. Site is unremarkable with no erythema, edema, tenderness, bleeding or drainage noted at exit site. Suture and stat lock in place. Dressing is clean dry and intact. 50 ml of  dark brown colored fluid noted in  gravity bag.  Musculoskeletal:        General: Normal range of motion.     Cervical back: Normal range of motion.  Skin:    General: Skin is warm and dry.  Neurological:     General: No focal deficit present.      Mental Status: He is alert and oriented to person, place, and time. Mental status is at baseline.  Psychiatric:        Mood and Affect: Mood normal.        Thought Content: Thought content normal.        Judgment: Judgment normal.     Imaging: IR INT EXT BILIARY DRAIN WITH CHOLANGIOGRAM Result Date: 03/18/2024 INDICATION: Biliary dilatation.  Aborted ERCP. Briefly, 84 year old male with distal CBD obstruction, suspected malignant. ERCP aborted secondary to inability to cannulate biliary tree. EXAM: Procedures; 1. PERCUTANEOUS TRANSHEPATIC CHOLANGIOGRAM 2. ULTRASOUND AND FLUOROSCOPIC GUIDED BILIARY DRAINAGE TUBE PLACEMENT COMPARISON:  MRCP, 03/15/2024. ERCP fluoroscopy, earlier same day. CT AP, 03/14/2024. MEDICATIONS: Rocephin  2 gm IV; The antibiotic was administered with an appropriate time frame prior to the initiation of the procedure 25 mg Demerol  IV CONTRAST:  20 mL Omnipaque  300-administered into the biliary tree. ANESTHESIA/SEDATION: Moderate (conscious) sedation was employed during this procedure. A total of Versed  2 mg and Fentanyl  100 mcg was administered intravenously. Moderate Sedation Time: 40 minutes. The patient's level of consciousness and vital signs were monitored continuously by radiology nursing throughout the procedure under my direct supervision. FLUOROSCOPY: Radiation Exposure Index and estimated peak skin dose (PSD); Reference air kerma (RAK), 41 mGy. COMPLICATIONS: None immediate. TECHNIQUE: Informed written consent was obtained from the patient and/or patient's representative after a discussion of the risks, benefits and alternatives to treatment. Questions regarding the procedure were encouraged and answered. A timeout was performed prior to the initiation of the procedure. The RIGHT upper abdominal quadrant was prepped and draped in the usual sterile fashion, and a sterile drape was applied covering the operative field. Maximum barrier sterile technique with sterile gowns  and gloves were used for the procedure. A timeout was performed prior to the initiation of the procedure. Ultrasound scanning of the right upper abdominal quadrant was performed to delineate the anatomy and avoid transgression of the gallbladder or the pleura. After the overlying soft tissues were anesthetized with 1% Lidocaine  with epinephrine , under direct ultrasound guidance, a 22 gauge Chiba needle was utilized to cannulate a peripheral aspect of an intrahepatic biliary duct. A RIGHT-sided approach distal to the biliary hilum was targeted. Appropriate position was confirmed with limited contrast injection. Next, the duct was cannulated with a Nitrex wire and dilated with an Accustick set under fluoroscopic guidance. Limited cholangiograms were performed in various obliquities confirming appropriate access. Next, a 4 Fr angled glide catheter was advanced through the outer sheath of the Accustick set and with the use of a stiff Glidewire, advanced through the biliary hilum, common bile duct and ampulla to the level of the duodenum. Contrast injection confirmed appropriate positioning. Under intermittent fluoroscopic guidance and over an Amplatz wire, the track was dilated ultimately allowing placement of a 10 Fr  biliary drainage catheter with coil ultimately locked within the duodenum. Contrast was injected and a completion radiographs were obtained in various obliquities. The catheter was connected to a drainage bag which yielded the brisk return of clear bile. The catheter was secured to the skin with an interrupted suture and StatLock device. Dressings were applied. The patient tolerated the procedure well without immediate postprocedural complication. FINDINGS: *Sonographic evaluation of the liver demonstrates intrahepatic biliary ductal dilatation as well as gallbladder distention. *Under direct ultrasound guidance, a dilated duct distal to the hilum from a RIGHT-sided approach was accessed *Placement of a 10  Fr biliary drainage catheter with end coiled and locked within the duodenum and radiopaque marker located proximal to the level of the biliary hilum. *Limited contrast injection demonstrates dilatation of the biliary tree, with obstruction at the distal the CBD. *20 mL of bile was submitted for microbiological analysis. IMPRESSION: 1. Successful placement of a 10 Fr percutaneous biliary drainage catheter via a RIGHT transhepatic approach, with end coiled and locked within the duodenum. 2. Distal common bile duct obstruction, with shouldering suspicious for malignancy. Bile submitted for microbiological and cytopathological analysis. RECOMMENDATIONS: The patient will return to [facility] ([facility]) for routine drainage catheter evaluation and exchange in 8 weeks. Thom Hall, MD [facility] Electronically Signed   By: Thom Hall M.D.   On: 03/18/2024 07:40   DG ERCP Result Date: 03/17/2024 CLINICAL DATA:  [id number] Surgery, elective [id number] EXAM: ERCP COMPARISON:  CT AP, 03/14/2024.  MRCP, 03/15/2024. FLUOROSCOPY: Exposure Index (as provided by the fluoroscopic device): 50.1 mGy Kerma FINDINGS: Limited oblique planar images of the RIGHT upper quadrant obtained C-arm. Images demonstrating flexible endoscopy. No submitted images for retrograde cholangiogram., IMPRESSION: Fluoroscopic imaging for ERCP. No submitted images for retrograde cholangiogram For complete description of intra procedural findings, please see performing service dictation. Electronically Signed   By: Thom Hall M.D.   On: 03/17/2024 13:53   MR ABDOMEN MRCP W WO CONTRAST Result Date: 03/15/2024 EXAM: MRCP WITH AND WITHOUT IV CONTRAST 03/15/2024 04:41:16 PM TECHNIQUE: Multisequence, multiplanar magnetic resonance images of the abdomen with and without intravenous contrast. MRCP sequences were performed. Acquisition workstation 3D images were acquired by the technologist  using existing protocol; radiologist available for consultation but did not designate specific images to acquire in this individual case. COMPARISON: CT abdomen 03/14/2024. CLINICAL HISTORY: Biliary ductal dilation and transaminitis. Suspected pancreatic head mass on recent CT scan. FINDINGS: LIVER: 8 mm focus of restricted diffusion just above the gallbladder fossa on image 24 series 8, not well corroborated on other imaging sequences, possibly artifact although a small subtle metastatic lesion cannot be totally excluded . GALLBLADDER AND BILIARY SYSTEM: Severe intrahepatic biliary dilatation and common bile duct dilatation up to 1.5 cm extending to a mass in the head of the pancreas. Mildly distended gallbladder with mild gallbladder wall thickening; strictly speaking, cholecystitis is not excluded. SPLEEN: Unremarkable. PANCREAS/PANCREATIC DUCT: Indistinctly marginated 3.3 x 2.3 x 2.1 cm mass in the head of the pancreas demonstrating relative isoenhancement on early arterial phase images and mildly accentuated enhancement on delayed images and also demonstrating substantial restriction of diffusion as on image 28 series 8. The dorsopancreatic duct is likewise prominently dilated extending into this mass. I don't see definite involvement of the celiac trunk or superior mesenteric artery. No definite invasion or encirclement of the portal vein or superior mesenteric vein (SMV). ADRENAL GLANDS: Unremarkable. KIDNEYS: Unremarkable. LYMPH NODES: No enlarged abdominal lymph nodes. VASCULATURE: No definite  involvement of the celiac trunk or superior mesenteric artery. No definite invasion or encirclement of the portal vein or superior mesenteric vein (SMV). PERITONEUM: No ascites. ABDOMINAL WALL: Left lumbar hernia on image 57 series 19 contains adipose tissue. No mass. BOWEL: Grossly unremarkable. No bowel obstruction. BONES: No acute abnormality or worrisome osseous lesion. SOFT TISSUES: Prior median sternotomy.  Motion artifact is present, reducing diagnostic sensitivity and specificity. MISCELLANEOUS: Unremarkable. IMPRESSION: 1. 3.3 x 2.3 x 2.1 cm pancreatic head mass associated with severe dorsal pancreatic duct and biliary dilatation , high suspicion for pancreatic adenocarcinoma. Tissue diagnosis recommended. Although we are limited by spatial resolution and motion artifact, no definite involvement observed of the celiac trunk, superior mesenteric artery, portal vein, or superior mesenteric vein. The mass is very indistinctly marginated and poorly visible on most sequences but is more conspicuous on high b-value diffusion-weighted images. 2. 8 mm in diameter focus of restricted diffusion in the liver just above the gallbladder fossa on image 24, series 8, nonspecific, although small metastatic lesion is difficult to exclude . 3. Mild gallbladder distention and mild gallbladder wall thickening; cholecystitis is not excluded. 4. Chronic and incidental findings include prior median sternotomy and a left lumbar hernia containing adipose tissue. Electronically signed by: Ryan Salvage MD MD 03/15/2024 05:11 PM EST RP Workstation: [id number]   MR 3D Recon At Scanner Result Date: 03/15/2024 EXAM: MRCP WITH AND WITHOUT IV CONTRAST 03/15/2024 04:41:16 PM TECHNIQUE: Multisequence, multiplanar magnetic resonance images of the abdomen with and without intravenous contrast. MRCP sequences were performed. Acquisition workstation 3D images were acquired by the technologist using existing protocol; radiologist available for consultation but did not designate specific images to acquire in this individual case. COMPARISON: CT abdomen 03/14/2024. CLINICAL HISTORY: Biliary ductal dilation and transaminitis. Suspected pancreatic head mass on recent CT scan. FINDINGS: LIVER: 8 mm focus of restricted diffusion just above the gallbladder fossa on image 24 series 8, not well corroborated on other imaging sequences, possibly artifact  although a small subtle metastatic lesion cannot be totally excluded . GALLBLADDER AND BILIARY SYSTEM: Severe intrahepatic biliary dilatation and common bile duct dilatation up to 1.5 cm extending to a mass in the head of the pancreas. Mildly distended gallbladder with mild gallbladder wall thickening; strictly speaking, cholecystitis is not excluded. SPLEEN: Unremarkable. PANCREAS/PANCREATIC DUCT: Indistinctly marginated 3.3 x 2.3 x 2.1 cm mass in the head of the pancreas demonstrating relative isoenhancement on early arterial phase images and mildly accentuated enhancement on delayed images and also demonstrating substantial restriction of diffusion as on image 28 series 8. The dorsopancreatic duct is likewise prominently dilated extending into this mass. I don't see definite involvement of the celiac trunk or superior mesenteric artery. No definite invasion or encirclement of the portal vein or superior mesenteric vein (SMV). ADRENAL GLANDS: Unremarkable. KIDNEYS: Unremarkable. LYMPH NODES: No enlarged abdominal lymph nodes. VASCULATURE: No definite involvement of the celiac trunk or superior mesenteric artery. No definite invasion or encirclement of the portal vein or superior mesenteric vein (SMV). PERITONEUM: No ascites. ABDOMINAL WALL: Left lumbar hernia on image 57 series 19 contains adipose tissue. No mass. BOWEL: Grossly unremarkable. No bowel obstruction. BONES: No acute abnormality or worrisome osseous lesion. SOFT TISSUES: Prior median sternotomy. Motion artifact is present, reducing diagnostic sensitivity and specificity. MISCELLANEOUS: Unremarkable. IMPRESSION: 1. 3.3 x 2.3 x 2.1 cm pancreatic head mass associated with severe dorsal pancreatic duct and biliary dilatation , high suspicion for pancreatic adenocarcinoma. Tissue diagnosis recommended. Although we are limited by spatial resolution and motion artifact,  no definite involvement observed of the celiac trunk, superior mesenteric artery, portal  vein, or superior mesenteric vein. The mass is very indistinctly marginated and poorly visible on most sequences but is more conspicuous on high b-value diffusion-weighted images. 2. 8 mm in diameter focus of restricted diffusion in the liver just above the gallbladder fossa on image 24, series 8, nonspecific, although small metastatic lesion is difficult to exclude . 3. Mild gallbladder distention and mild gallbladder wall thickening; cholecystitis is not excluded. 4. Chronic and incidental findings include prior median sternotomy and a left lumbar hernia containing adipose tissue. Electronically signed by: Ryan Salvage MD MD 03/15/2024 05:11 PM EST RP Workstation: [id number]   CT ABDOMEN PELVIS WO CONTRAST Result Date: 03/14/2024 EXAM: CT ABDOMEN AND PELVIS WITHOUT CONTRAST 03/14/2024 06:52:08 PM TECHNIQUE: CT of the abdomen and pelvis was performed without the administration of intravenous contrast. Multiplanar reformatted images are provided for review. Automated exposure control, iterative reconstruction, and/or weight-based adjustment of the mA/kV was utilized to reduce the radiation dose to as low as reasonably achievable. COMPARISON: None available. CLINICAL HISTORY: Abdominal pain, acute, nonlocalized; Hypotensive, elevated liver/bilirubin. Dizziness. Weight loss. FINDINGS: LOWER CHEST: Sternotomy wires. Mild dependent changes in the lung bases. LIVER: Diffuse intra- and extrahepatic bile duct dilatation. GALLBLADDER AND BILE DUCTS: The gallbladder is mildly distended. No gallstones are identified. Diffuse intra- and extrahepatic bile duct dilatation. SPLEEN: The spleen is normal. No nodules. PANCREAS: Pancreatic ductal dilatation with fullness in the head of the pancreas. Unenhanced evaluation is limited, but this likely represents a pancreatic mass measuring about 2.5 cm in diameter. Correlate with MRCP or contrast-enhanced CT abdomen and pelvis for further evaluation. ADRENAL GLANDS: No acute  abnormality. KIDNEYS, URETERS AND BLADDER: Kidneys are normal. No stones in the kidneys or ureters. No hydronephrosis. No perinephric or periureteral stranding. The bladder wall is mildly thickened, likely due to outlet obstruction. GI AND BOWEL: Stomach, small bowel, and colon are not abnormally distended. No discrete wall thickening is appreciated. There is no bowel obstruction. Small left inguinal hernia containing small bowel without proximal obstruction. PERITONEUM AND RETROPERITONEUM: No ascites. No free air. VASCULATURE: Aorta is normal in caliber. Calcification of the aorta. No aneurysm. LYMPH NODES: No lymphadenopathy. REPRODUCTIVE ORGANS: The prostate gland is enlarged. Measuring 6.5 cm in diameter. BONES AND SOFT TISSUES: Degenerative changes in the spine. No focal bone lesions. Left lumbar triangle abdominal wall hernia containing fat. IMPRESSION: 1. Fullness in the head of the pancreas with associated pancreatic and bile duct dilatation, likely representing a pancreatic adenocarcinoma measuring about 2.5 cm in diameter; unenhanced evaluation is limited, and MRCP or contrast-enhanced CT abdomen and pelvis is recommended for further evaluation. Electronically signed by: Elsie Gravely MD 03/14/2024 07:22 PM EST RP Workstation: [id number]    Labs:  CBC: Recent Labs    03/14/24 1730 03/16/24 0447 03/17/24 0433 03/18/24 0425  WBC 6.1 5.7 6.5 5.3  HGB 9.2* 8.4* 8.1* 8.7*  HCT 26.0* 23.1* 21.8* 23.3*  PLT 258 249 238 244    COAGS: Recent Labs    03/14/24 1730 03/16/24 0447  INR 1.0 1.1    BMP: Recent Labs    03/15/24 0947 03/16/24 0447 03/17/24 0433 03/18/24 0425  NA 139 139 141 139  K 4.3 4.0 3.7 4.0  CL 109 108 109 101  CO2 18* 20* 22 29  GLUCOSE 127* 124* 89 224*  BUN 25* 22 16 24*  CALCIUM  8.9 9.0 8.9 8.8*  CREATININE 2.75* 3.20* 3.09* 3.11*  GFRNONAA 22* 18* 19* 19*  LIVER FUNCTION TESTS: Recent Labs    03/14/24 1730 03/16/24 0447 03/17/24 0433  03/18/24 0425  BILITOT 8.2* 7.4* 7.8* 4.9*  AST 222* 255* 260* 234*  ALT 285* 236* 228* 227*  ALKPHOS 1,876* 1,691* 1,545* 1,560*  PROT 6.7 5.7* 5.1* 5.7*  ALBUMIN  3.3* 2.7* 2.6* 2.6*    Assessment and Plan:  84 y.o. male inpatient. History of CHD, CAD s/p CABG, MGUS, HTN, CHF HLD, Presented to the ED at [facility] on 1.7.25 with woeigt loss lightheaded. Found to have hypotensive with elevated liver enzymes and obstructive jaundice secondary to a pancreatic head mass with biliary duct dilation. ERCP performed  .9.26 was unsuccessful in placing a biliary stent. On 1.9.25 IR placed a 10 Fr right sided internal external biliary drain.   Total Bilirubin 4.9 < 7.8< 7.4< 8.2 AST 234< 260< 255 ALT 227< 228< 235 Alkaline Phosphatase 1560< 1545< 1691  Drain Location: RUQ Size: Fr size: 10 Fr Date of placement: 1.9.25  Currently to: Drain collection device: suction bulb 24 hour output:  Output by Drain (mL) 03/16/24 0700 - 03/16/24 1459 03/16/24 1500 - 03/16/24 2259 03/16/24 2300 - 03/17/24 0659 03/17/24 0700 - 03/17/24 1459 03/17/24 1500 - 03/17/24 2259 03/17/24 2300 - 03/18/24 0659 03/18/24 0700 - 03/18/24 1349  Biliary Tube RLQ      550 100    Interval imaging/drain manipulation:  none  Current examination: Flushes/aspirates easily.  Insertion site unremarkable. Suture and stat lock in place. Dressed appropriately.   Plan: Continue TID flushes with 5 cc NS. Record output Q shift. Dressing changes QD or PRN if soiled.  Call IR APP or on call IR MD if difficulty flushing or sudden change in drain output.   Discharge planning: Please contact IR APP or on call IR MD prior to patient d/c to ensure appropriate follow up plans are in place. Typically patient will follow up with IR 6-8 weeks  post d/c for possible drain exchange. IR scheduler will contact patient with date/time of appointment. Patient will need to flush drain QD with 5 cc NS, record output QD, dressing changes every 2-3 days or  earlier if soiled.   IR will continue to follow - please call with questions or concerns.  Electronically Signed: Delon JAYSON Beagle, NP 03/18/2024, 2:59 PM   I spent a total of 15 Minutes at the the patient's bedside AND on the patient's hospital floor or unit, greater than 50% of which was counseling/coordinating care for internal external biliary drain        "

## 2024-03-18 NOTE — Progress Notes (Signed)
 [facility] Progress Note  Subjective:  BP's remain stable  I/O yest 3.6 L in and 1375 cc out (825 was urine) Creat unchanged today at 3.11  Presentation summary: CKD 3a, CAD, MGUS, HTN, systolic HF, HL who presented to ED on 1/06 c/o dizziness and lightheadness at home and took his BP and it was 90 systolic. Has been losing weight since losing appetite in Dec 2025. Stated he had lost 15 lbs in 3 wks. In ED BP's 104/58, HR 75, RR 20, temp 97.5. labs showed K+ 4.5, bun 27, creat 2.57, alk phos 1876, alb 3.3, lipase 62, AST 22, ALT 285, Tbili 8.2, eGFR 24. LA 1.2, WBC 6K, Hb 9.2 -> 8.4.  plt wnl. UA amber, clear, neg LE/ nit, prot 100, rbc 11-20, wbc 6-10, rare bact, no epis. UNa 36, UCr 93. CT abd showed normal appearing kidney, no stones, no hydro. No stranding. Bladder wall mildly thickened likely due to outlet obstruction.  Pt seen in room. Denies any hx of kidney problems. Hx of CHF dx'd about 10-15 yrs ago. Denies any voiding issues. Poor appetite which started in Dec and has lost sig amt of weight. No f/c/s, no n/v/d.     Vitals:   03/18/24 0510 03/18/24 0854 03/18/24 1343 03/18/24 1654  BP:  (!) 102/54 (!) 129/58 (!) 119/53  Pulse:  63 62 71  Resp: (!) 21 18 18 18   Temp:  (!) 97.2 F (36.2 C) 98.9 F (37.2 C) 98.6 F (37 C)  TempSrc:  Oral Oral Oral  SpO2:  100% 99% 96%  Weight: 61.2 kg     Height:        Exam: Gen alert, no distress, pleasant elderly male Sclera anicteric, throat clear  Flat neck veins Chest clear bilat to bases RRR no MRG Abd soft ntnd no mass or ascites +bs, liver edge slightly palpable about 1/2 inch down Ext no LE or UE edema, no other edema Neuro is alert, Ox 3 , nf     Relevant home meds: Coreg   Losartan   Lasix  20 every day Aldactone  25 every day     Date                             Creat               eGFR (ml/min) 2010- 2013                  1.26- 1.70 2017- 2019                  1.20- 1.91 2020                             1.40- 2.31 2021- 2023                  1.42- 2.13 2024                            1.29- 1.53        45- 55 ml/min   12/21/23                      1.38 1/06                             2.57  24         1/07                             2.75 1/08                             3.20                 18             UA - clear, neg LE/ nit, prot 100, rbc 11-20, wbc 6-10, rare bact UNa 36, UCr 93 CT abd ->  showed normal appearing kidney, no stones, no hydro. No stranding. Bladder wall mildly thickened likely due to outlet obstruction ED Rx = 1 L NS bolus, then LR at 125/hr IV zosyn  No CT contrast       Assessment/ Plan:   # AKI on CKD 3a: b/l creatinine is 1.3- 1.5 from 2024 and oct 2025, eGFR 45-55 ml/min - creatinine here was 2.5 on admission in setting of dizziness, wt loss, loss of appetite and soft bp's, new mass at head of pancreas w/ severe biliary dilatation, w/u in progress - no vol excess on exam - CT showed no hydro and bladder thickened possibly d/t bladder outlet obstruction - UNa borderline low - prior hx of systolic CHF, but not vol overloaded here - suspect AKI due to vol depletion +/- bladder outlet obstruction, less likely bilirubin nephropathy (usually bili would be higher) vs other; also h/o MGUS and SPEP/ serum FLC are pending - IVFs were ^'d and foley placed - UOP improved to 800 cc/ d - creatinine stuck at 3.0 - change IVF's to LR, and will lower to 75 cc/hr - will follow closely  # CKD 3a - b/l creatinine 1.3-1.5 in 2024 and oct 2025, eGFR 45- 55 ml/min  # metabolic acidosis - resolved w/ bicarb gtt - switch IVFs to LR at 75 cc/hr    # pancreatic mass/ biliary obstruction - suspected malignancy per GI notes - unable to do ERCP yesterday - IR did transhepatic biliary biliary tube placement 1/09   # h/o systolic CHF     Rob Frances Joynt MD  [facility] 03/18/2024, 5:51 PM  Recent Labs  Lab 03/17/24 0433 03/18/24 0425  HGB 8.1* 8.7*  ALBUMIN  2.6*  2.6*  CALCIUM  8.9 8.8*  CREATININE 3.09* 3.11*  K 3.7 4.0   Recent Labs  Lab 03/16/24 0447  IRON  68  TIBC 213*  FERRITIN 2,730*   Inpatient medications:  Chlorhexidine  Gluconate Cloth  6 each Topical Daily   diclofenac   100 mg Rectal Once   latanoprost   1 drop Both Eyes Daily   lidocaine -EPINEPHrine   20 mL Intradermal Once   meperidine  (DEMEROL ) injection  25 mg Intravenous Once   senna  1 tablet Oral BID   sodium chloride  flush  5 mL Intracatheter Q8H    cefTRIAXone  (ROCEPHIN )  IV     piperacillin -tazobactam (ZOSYN )  IV 2.25 g (03/18/24 1129)   sodium bicarbonate  150 mEq in sterile water  1,150 mL infusion 100 mL/hr at 03/18/24 1747   acetaminophen  **OR** acetaminophen , fentaNYL , fentaNYL , iohexol , iohexol , menthol , meperidine , meperidine , midazolam  PF, midazolam  PF, morphine  injection, ondansetron  (ZOFRAN ) IV

## 2024-03-18 NOTE — Hospital Course (Addendum)
 Luis Chen is a 84 y.o. male with PMH of CKD stage III, CAD, MGUS, HLD who presented with lightheadedness and low blood pressure, and weight loss over the last few weeks. Blood work in the ED showed elevated LFTs, significantly elevated alkaline phosphatase and bilirubin. CT abdomen/pelvis showed concern for pancreatic mass , labs showed AKI.  Gastroenterology and nephrology IR has been consulted.   S/p ERCP but unable to pass guidewire, now s/p perc biliary drain by IR.   Subjective: Seen and examined today Resting comfortably, some discomfort in the abdomen, son at the bedside Overnight afebrile BP fairly stable, on room air, VSS, Labs-creatinine about the same 3.1 from 3.0 alk phos AST ALT remains elevated total bili down 7.8> 4.9, CBC stable hemoglobin ~ 8  Assessment and plan:   Obstructive jaundice due to pancreatic head mass: MRCP-pancreatic head mass associated with severe dorsal pancreatic duct and biliary dilation-GI consulted underwent ERCP-unable to pass guidewire, now s/p perc biliary drain by IR. CA 19-9 levels elevated 2010, cytology pending.  Biliary drain output 550 cc Remains afebrile LFTs remains elevated, T. bili now improving, continue on Zosyn , IV fluid hydration Appreciate GI follow-up and management  AKI on CKD 3a: B/l creat~1.3-1.5 in 2024 and October 2025.  Currently remains elevated nephrology following CT showed no hydro and bladder thickening possibly due to bladder outlet obstruction likely volume depletion and bladder outlet obstruction contributed.  Continue Foley, monitor output. Continue sodium bicarb drip per nephro Recent Labs    06/15/23 1116 12/21/23 1307 03/14/24 1730 03/15/24 0947 03/16/24 0447 03/17/24 0433 03/18/24 0425  BUN 17 20 27* 25* 22 16 24*  CREATININE 1.31* 1.38* 2.57* 2.75* 3.20* 3.09* 3.11*  CO2 23 27 21* 18* 20* 22 29  K 4.4 4.4 4.5 4.3 4.0 3.7 4.0    Normocytic anemia: Remains stable transfuse if less than 7 g suspect in the  setting of #1  CAD: Antiplatelets on hold resume once okay postprocedure by GI or IR  Glaucoma: Continue eyedrops  Hyperglycemia: CBG more than 200, check A1c  Moderate malnutrition with Weight loss, low BMI Body mass index is 19.36 kg/m.: Augment diet as able.    Mobility: PT Orders: Active PT Follow up Rec: No Pt Follow Up1/10/2024 1354   DVT prophylaxis: SCDs Start: 03/15/24 1637 Code Status:   Code Status: Full Code Family Communication: plan of care discussed with patient and son at the bedside. Patient status is: Remains hospitalized because of severity of illness Level of care: Telemetry   Dispo: The patient is from: home            Anticipated disposition: TBD Objective: Vitals last 24 hrs: Vitals:   03/18/24 0040 03/18/24 0328 03/18/24 0452 03/18/24 0510  BP: (!) 125/58  (!) 112/52   Pulse: (!) 57  65   Resp: 18 18 18  (!) 21  Temp: (!) 97.5 F (36.4 C)     TempSrc: Oral     SpO2: 98%  99%   Weight:    61.2 kg  Height:       Physical Examination: General exam: AAOX3 HEENT:Oral mucosa moist, Ear/Nose WNL grossly Respiratory system: Bilaterally clear BS,no use of accessory muscle Cardiovascular system: S1 & S2 +, No JVD. Gastrointestinal system: Abdomen soft, tender,ND, BS+ Nervous System: Alert, awake, moving all extremities,and following commands. Extremities: extremities warm, leg edema neg Skin: Warm, no rashes MSK: Normal muscle bulk,tone, power   Medications reviewed:  Scheduled Meds:  Chlorhexidine  Gluconate Cloth  6 each Topical  Daily   diclofenac   100 mg Rectal Once   latanoprost   1 drop Both Eyes Daily   lidocaine -EPINEPHrine   20 mL Intradermal Once   meperidine  (DEMEROL ) injection  25 mg Intravenous Once   senna  1 tablet Oral BID   sodium chloride  flush  5 mL Intracatheter Q8H   Continuous Infusions:  cefTRIAXone  (ROCEPHIN )  IV     piperacillin -tazobactam (ZOSYN )  IV 2.25 g (03/18/24 0447)   sodium bicarbonate  150 mEq in sterile water   1,150 mL infusion 100 mL/hr at 03/18/24 0445   Diet: Diet Order             Diet Heart Room service appropriate? Yes; Fluid consistency: Thin  Diet effective now

## 2024-03-19 ENCOUNTER — Encounter (HOSPITAL_COMMUNITY): Payer: Self-pay | Admitting: Internal Medicine

## 2024-03-19 DIAGNOSIS — K831 Obstruction of bile duct: Secondary | ICD-10-CM | POA: Diagnosis not present

## 2024-03-19 DIAGNOSIS — C250 Malignant neoplasm of head of pancreas: Secondary | ICD-10-CM | POA: Diagnosis not present

## 2024-03-19 DIAGNOSIS — N189 Chronic kidney disease, unspecified: Secondary | ICD-10-CM | POA: Diagnosis not present

## 2024-03-19 LAB — CULTURE, BLOOD (ROUTINE X 2)
Culture: NO GROWTH
Culture: NO GROWTH
Special Requests: ADEQUATE
Special Requests: ADEQUATE

## 2024-03-19 LAB — RENAL FUNCTION PANEL
Albumin: 2.6 g/dL — ABNORMAL LOW (ref 3.5–5.0)
Anion gap: 9 (ref 5–15)
BUN: 23 mg/dL (ref 8–23)
CO2: 29 mmol/L (ref 22–32)
Calcium: 8.6 mg/dL — ABNORMAL LOW (ref 8.9–10.3)
Chloride: 100 mmol/L (ref 98–111)
Creatinine, Ser: 2.93 mg/dL — ABNORMAL HIGH (ref 0.61–1.24)
GFR, Estimated: 20 mL/min — ABNORMAL LOW
Glucose, Bld: 101 mg/dL — ABNORMAL HIGH (ref 70–99)
Phosphorus: 2.5 mg/dL (ref 2.5–4.6)
Potassium: 3.6 mmol/L (ref 3.5–5.1)
Sodium: 138 mmol/L (ref 135–145)

## 2024-03-19 LAB — HEPATIC FUNCTION PANEL
ALT: 155 U/L — ABNORMAL HIGH (ref 0–44)
AST: 103 U/L — ABNORMAL HIGH (ref 15–41)
Albumin: 2.6 g/dL — ABNORMAL LOW (ref 3.5–5.0)
Alkaline Phosphatase: 1175 U/L — ABNORMAL HIGH (ref 38–126)
Bilirubin, Direct: 2.5 mg/dL — ABNORMAL HIGH (ref 0.0–0.2)
Indirect Bilirubin: 0.7 mg/dL (ref 0.3–0.9)
Total Bilirubin: 3.1 mg/dL — ABNORMAL HIGH (ref 0.0–1.2)
Total Protein: 5.3 g/dL — ABNORMAL LOW (ref 6.5–8.1)

## 2024-03-19 LAB — CBC
HCT: 20.8 % — ABNORMAL LOW (ref 39.0–52.0)
Hemoglobin: 7.8 g/dL — ABNORMAL LOW (ref 13.0–17.0)
MCH: 31.1 pg (ref 26.0–34.0)
MCHC: 37.5 g/dL — ABNORMAL HIGH (ref 30.0–36.0)
MCV: 82.9 fL (ref 80.0–100.0)
Platelets: 240 K/uL (ref 150–400)
RBC: 2.51 MIL/uL — ABNORMAL LOW (ref 4.22–5.81)
RDW: 17.3 % — ABNORMAL HIGH (ref 11.5–15.5)
WBC: 11.3 K/uL — ABNORMAL HIGH (ref 4.0–10.5)
nRBC: 0 % (ref 0.0–0.2)

## 2024-03-19 MED ORDER — HEPARIN SODIUM (PORCINE) 5000 UNIT/ML IJ SOLN
5000.0000 [IU] | Freq: Three times a day (TID) | INTRAMUSCULAR | Status: DC
  Administered 2024-03-19 – 2024-03-24 (×14): 5000 [IU] via SUBCUTANEOUS
  Filled 2024-03-19 (×14): qty 1

## 2024-03-19 NOTE — Progress Notes (Signed)
 HISTORY OF PRESENT ILLNESS:  Luis Chen is a 84 y.o. male admitted with obstructive jaundice.  Imaging suggests head of pancreas mass consistent with adenocarcinoma.  Underwent ERCP without successful stent placement.  Subsequent percutaneous drainage.  Doing well without complaints.  Daughter in room.  Cytopathology pending.  Laboratories: Liver tests 103/155/1175/3.1 improved WBC 11.3, hemoglobin 7.8   REVIEW OF SYSTEMS:  All non-GI ROS negative.  Past Medical History:  Diagnosis Date   Anemia, chronic disease 07/01/2015   Angina decubitus    Benign essential hypertension 10/31/2008   Qualifier: Diagnosis of  By: Lavona, MD, CODY Agent     CAD (coronary artery disease)    non obstructive. Left main normal. LAd proximal long 25% stenosis, termingating as focal 50% prox to mid lesion. First & second diag were small, normal. Circumflex in proximal av groove had luminal irregularities.. Was large mid obtuse marg which was branching, scattered luminal irregul. Infer branch did supply some septal perforators. Was PDA,small & normal.  < 1.21mm. There was prox 95% stenosis   CARDIOMYOPATHY 11/27/2008   CKD (chronic kidney disease)    Hyperlipidemia    transient history, controlled on diet   Hypertension    Mixed hyperlipidemia 10/30/2008   Qualifier: Diagnosis of  By: Leana, CNA, [name]     Non-traumatic rhabdomyolysis 07/03/2015   Polyarthropathy 07/01/2015   Primary osteoarthritis of knees, bilateral 12/10/2014   Pyogenic arthritis of left knee joint (HCC)    Swelling of left knee joint 07/01/2015   Systolic HF (heart failure) (HCC) 02/08/2018    Past Surgical History:  Procedure Laterality Date   CORONARY ARTERY BYPASS GRAFT N/A 02/17/2018   Procedure: CORONARY ARTERY BYPASS GRAFTING (CABG);  Surgeon: Lucas Dorise POUR, MD;  Location: [facility] OR;  Service: Open Heart Surgery;  Laterality: N/A;  Times 3 using endoscopically harvested right saphenous vein.     ERCP N/A 03/17/2024    Procedure: ERCP, WITH INTERVENTION IF INDICATED;  Surgeon: Abran Norleen SAILOR, MD;  Location: [facility] ENDOSCOPY;  Service: Gastroenterology;  Laterality: N/A;   IR INT EXT BILIARY DRAIN WITH CHOLANGIOGRAM  03/17/2024   KNEE ARTHROSCOPY Left 07/01/2015   Procedure: Irrigation and debridement left knee arthroscopy;  Surgeon: Evalene JONETTA Chancy, MD;  Location: [facility] OR;  Service: Orthopedics;  Laterality: Left;   RIGHT/LEFT HEART CATH AND CORONARY ANGIOGRAPHY N/A 02/08/2018   Procedure: RIGHT/LEFT HEART CATH AND CORONARY ANGIOGRAPHY;  Surgeon: Dann Candyce RAMAN, MD;  Location: [facility] INVASIVE CV LAB;  Service: Cardiovascular;  Laterality: N/A;   TEE WITHOUT CARDIOVERSION N/A 02/17/2018   Procedure: TRANSESOPHAGEAL ECHOCARDIOGRAM (TEE);  Surgeon: Lucas Dorise POUR, MD;  Location: [facility] OR;  Service: Open Heart Surgery;  Laterality: N/A;    Social History Luis Chen  reports that he has never smoked. He has never used smokeless tobacco. He reports current alcohol use. He reports that he does not use drugs.  family history includes Arthritis in his mother; Diabetes in his mother; Heart attack (age of onset: 47) in his mother; Heart disease in his brother and mother; Hyperlipidemia in his mother; Hypertension in his mother; Hypothyroidism in his daughter; Stroke in his daughter.  Allergies[1]     PHYSICAL EXAMINATION: Vital signs: BP 135/64 (BP Location: Left Arm)   Pulse 71   Temp 98.6 F (37 C) (Oral)   Resp 18   Ht 5' 10 (1.778 m)   Wt 68.4 kg   SpO2 99%   BMI 21.64 kg/m  General: Pleasant, thin elderly male, no acute distress  HEENT: Sclerae are anicteric, conjunctiva pink. Oral mucosa intact Lungs: Clear Heart: Regular Abdomen: soft, nontender, nondistended, no obvious ascites, no peritoneal signs, normal bowel sounds. No organomegaly.  Right-sided percutaneous drain in place with bilious drainage in the bag Extremities: No edema Psychiatric: alert and oriented x3. Cooperative   ASSESSMENT:  1.   Obstructive jaundice likely secondary to pancreatic cancer.  Status post failed ERCP.  Status post successful percutaneous drainage.  Liver studies improving 2.  Chronic renal insufficiency 3.  Multiple medical problems   PLAN:  1.  Follow-up cytopathology 2.  If cytopathology positive, refer to oncology 3.  If cytopathology negative, then consider EUS with biopsy for diagnosis 4.  Management of percutaneous drain per IR         [1]  Allergies Allergen Reactions   Ace Inhibitors Swelling and Other (See Comments)    Facial swelling    Entresto  [Sacubitril-Valsartan] Swelling and Other (See Comments)    Angiotensin Receptor Neprilysin Inhibitor

## 2024-03-19 NOTE — Progress Notes (Signed)
 " PROGRESS NOTE Luis Chen  FMW:[id number] DOB: [birth year] DOA: 03/14/2024 PCP: [name], MD  Brief Narrative/Hospital Course: Luis Chen is a 84 y.o. male with PMH of CKD stage III, CAD, MGUS, HLD who presented with lightheadedness and low blood pressure, and weight loss over the last few weeks. Blood work in the ED showed elevated LFTs, significantly elevated alkaline phosphatase and bilirubin. CT abdomen/pelvis showed concern for pancreatic mass , labs showed AKI.  Gastroenterology and nephrology IR has been consulted.   S/p ERCP but unable to pass guidewire, now s/p perc biliary drain by IR.   Subjective: Seen and examined  At the bedside.  Patient appears alert awake comfortable no complaints Overall remains afebrile BP stable on room air  Labs reviewed stable electrolytes Alk phos AST ALT downtrending, TB down 7.8> 4.9> 3.1, biliary drain down to 275 cc previously 900  Assessment and plan:   Obstructive jaundice due to pancreatic head mass: MRCP-pancreatic head mass associated with severe dorsal pancreatic duct and biliary dilation-GI consulted underwent ERCP-unable to pass guidewire, now s/p perc biliary drain by IR. CA 19-9 levels elevated 2010, cytology pending.  Monitor LFTs, biliary drain output, continue flush and drain care as per IR.  If brushing cytology negative would consider EUS if he is a candidate for palliative oncologic care Continue on Zosyn , Continue plan as per GI  AKI on CKD 3a Metabolic acidosis: B/l creat~1.3-1.5 in 2024 and October 2025.CT showed no hydro and bladder thickening possibly due to bladder outlet obstruction likely volume depletion and bladder outlet obstruction contributed.  Continue Foley, monitor output.  Bicarb improved trending up Nephrology following and on bicarb drip and RL-continue per nephro, monitor electrolytes, avoid nephrotoxic medication Recent Labs    06/15/23 1116 12/21/23 1307 03/14/24 1730 03/15/24 0947  03/16/24 0447 03/17/24 0433 03/18/24 0425 03/19/24 0415  BUN 17 20 27* 25* 22 16 24* 23  CREATININE 1.31* 1.38* 2.57* 2.75* 3.20* 3.09* 3.11* 2.93*  CO2 23 27 21* 18* 20* 22 29 29   K 4.4 4.4 4.5 4.3 4.0 3.7 4.0 3.6    Normocytic anemia: Remains stable transfuse if less than 7 g suspect in the setting of #1 Recent Labs  Lab 03/14/24 1730 03/16/24 0447 03/17/24 0433 03/18/24 0425 03/19/24 0414  HGB 9.2* 8.4* 8.1* 8.7* 7.8*  HCT 26.0* 23.1* 21.8* 23.3* 20.8*    CAD: Antiplatelets on hold resume once okay postprocedure by GI or IR  Glaucoma: Continue eyedrops  Prediabetes with Hyperglycemia: A1c 6.2, continue diet control  Moderate malnutrition with Weight loss, low BMI Body mass index is 21.64 kg/m.: Augment diet as able.    Mobility: PT Orders: Active PT Follow up Rec: No Pt Follow Up1/10/2024 1354   DVT prophylaxis: SCDs Start: 03/15/24 1637 Code Status:   Code Status: Full Code Family Communication: plan of care discussed with patient and son at the bedside. Patient status is: Remains hospitalized because of severity of illness Level of care: Telemetry   Dispo: The patient is from: home            Anticipated disposition: TBD Objective: Vitals last 24 hrs: Vitals:   03/18/24 1654 03/18/24 2032 03/19/24 0420 03/19/24 0444  BP: (!) 119/53 124/63  135/64  Pulse: 71 64  71  Resp: 18 18  18   Temp: 98.6 F (37 C) 98.9 F (37.2 C)  98.6 F (37 C)  TempSrc: Oral Oral  Oral  SpO2: 96% 97%  99%  Weight:   68.4 kg  Height:       Physical Examination: General exam: AAOX3 HEENT:Oral mucosa moist, Ear/Nose WNL grossly Respiratory system: Bilaterally clear Cardiovascular system: S1 & S2 +, No JVD. Gastrointestinal system: Abdomen soft, tender,ND, BS+ biliary drain+ Nervous System: Alert, awake, moving all extremities,and following commands. Extremities: extremities warm, leg edema neg Skin: Warm, no rashes MSK: Normal muscle bulk,tone, power   Medications  reviewed:  Scheduled Meds:  Chlorhexidine  Gluconate Cloth  6 each Topical Daily   diclofenac   100 mg Rectal Once   latanoprost   1 drop Both Eyes Daily   lidocaine -EPINEPHrine   20 mL Intradermal Once   meperidine  (DEMEROL ) injection  25 mg Intravenous Once   senna  1 tablet Oral BID   sodium chloride  flush  5 mL Intracatheter Q8H   Continuous Infusions:  cefTRIAXone  (ROCEPHIN )  IV     lactated ringers  75 mL/hr at 03/19/24 0901   piperacillin -tazobactam (ZOSYN )  IV 2.25 g (03/19/24 0419)   sodium bicarbonate  150 mEq in sterile water  1,150 mL infusion 100 mL/hr at 03/18/24 1747   Diet: Diet Order             Diet Heart Room service appropriate? Yes; Fluid consistency: Thin  Diet effective now                   Data Reviewed: I have personally reviewed following labs and imaging studies ( see epic result tab) CBC: Recent Labs  Lab 03/14/24 1730 03/16/24 0447 03/17/24 0433 03/18/24 0425 03/19/24 0414  WBC 6.1 5.7 6.5 5.3 11.3*  NEUTROABS 3.9  --   --   --   --   HGB 9.2* 8.4* 8.1* 8.7* 7.8*  HCT 26.0* 23.1* 21.8* 23.3* 20.8*  MCV 88.7 85.9 84.5 84.1 82.9  PLT 258 249 238 244 240   CMP: Recent Labs  Lab 03/15/24 0947 03/16/24 0447 03/17/24 0433 03/18/24 0425 03/19/24 0415  NA 139 139 141 139 138  K 4.3 4.0 3.7 4.0 3.6  CL 109 108 109 101 100  CO2 18* 20* 22 29 29   GLUCOSE 127* 124* 89 224* 101*  BUN 25* 22 16 24* 23  CREATININE 2.75* 3.20* 3.09* 3.11* 2.93*  CALCIUM  8.9 9.0 8.9 8.8* 8.6*  PHOS  --   --   --   --  2.5   GFR: Estimated Creatinine Clearance: 17.8 mL/min (A) (by C-G formula based on SCr of 2.93 mg/dL (H)). Recent Labs  Lab 03/14/24 1730 03/16/24 0447 03/17/24 0433 03/18/24 0425 03/19/24 0414 03/19/24 0415  AST 222* 255* 260* 234* 103*  --   ALT 285* 236* 228* 227* 155*  --   ALKPHOS 1,876* 1,691* 1,545* 1,560* 1,175*  --   BILITOT 8.2* 7.4* 7.8* 4.9* 3.1*  --   PROT 6.7 5.7* 5.1* 5.7* 5.3*  --   ALBUMIN  3.3* 2.7* 2.6* 2.6* 2.6* 2.6*     Recent Labs  Lab 03/14/24 1850  LIPASE 62*   No results for input(s): AMMONIA in the last 168 hours. Coagulation Profile:  Recent Labs  Lab 03/14/24 1730 03/16/24 0447  INR 1.0 1.1   Unresulted Labs (From admission, onward)     Start     Ordered   03/19/24 0500  Hepatic function panel  Daily,   R      03/18/24 1159   03/19/24 0500  CBC  Daily,   R      03/18/24 1159   03/18/24 0500  Renal function panel  Daily,   R  03/17/24 1400   03/16/24 1719  Protein electrophoresis, serum  Once,   R        03/16/24 1718           Antimicrobials/Microbiology: Anti-infectives (From admission, onward)    Start     Dose/Rate Route Frequency Ordered Stop   03/17/24 1615  cefTRIAXone  (ROCEPHIN ) 2 g in sodium chloride  0.9 % 100 mL IVPB        2 g 200 mL/hr over 30 Minutes Intravenous  Once 03/17/24 1517     03/15/24 2000  piperacillin -tazobactam (ZOSYN ) IVPB 2.25 g        2.25 g 100 mL/hr over 30 Minutes Intravenous Every 8 hours 03/15/24 1555     03/15/24 0800  piperacillin -tazobactam (ZOSYN ) IVPB 3.375 g  Status:  Discontinued        3.375 g 12.5 mL/hr over 240 Minutes Intravenous Every 12 hours 03/15/24 0520 03/15/24 1555   03/15/24 0600  piperacillin -tazobactam (ZOSYN ) IVPB 2.25 g  Status:  Discontinued       Placed in Followed by Linked Group   2.25 g 100 mL/hr over 30 Minutes Intravenous Every 6 hours 03/14/24 2217 03/15/24 0519   03/14/24 2230  piperacillin -tazobactam (ZOSYN ) IVPB 3.375 g       Placed in Followed by Linked Group   3.375 g 100 mL/hr over 30 Minutes Intravenous  Once 03/14/24 2217 03/15/24 9394         Component Value Date/Time   SDES  03/17/2024 1758    BILE Performed at [facility], [address], [place], [place] [address]    SPECREQUEST  03/17/2024 1758    NONE Performed at [facility], [address] [place], [place] [address]    CULT  03/17/2024 1758    NO GROWTH 2 DAYS Performed at [facility], [address], [place], [place] [address]    REPTSTATUS PENDING 03/17/2024 1758    Procedures: Procedures (LRB): ERCP, WITH INTERVENTION IF INDICATED (N/A)   Mennie LAMY, MD [facility] 03/19/2024, 11:56 AM   "

## 2024-03-19 NOTE — Progress Notes (Addendum)
 [facility] Progress Note  Subjective:  BP's remain stable  I/O 2.8 L in and 2.7 L out (1800 cc UOP) Creat down at 2.9 today Pt stable, no SOB, no c/o's  Presentation summary: CKD 3a, CAD, MGUS, HTN, systolic HF, HL who presented to ED on 1/06 c/o dizziness and lightheadness at home and took his BP and it was 90 systolic. Has been losing weight since losing appetite in Dec 2025. Stated he had lost 15 lbs in 3 wks. In ED BP's 104/58, HR 75, RR 20, temp 97.5. labs showed K+ 4.5, bun 27, creat 2.57, alk phos 1876, alb 3.3, lipase 62, AST 22, ALT 285, Tbili 8.2, eGFR 24. LA 1.2, WBC 6K, Hb 9.2 -> 8.4.  plt wnl. UA amber, clear, neg LE/ nit, prot 100, rbc 11-20, wbc 6-10, rare bact, no epis. UNa 36, UCr 93. CT abd showed normal appearing kidney, no stones, no hydro. No stranding. Bladder wall mildly thickened likely due to outlet obstruction.  Pt seen in room. Denies any hx of kidney problems. Hx of CHF dx'd about 10-15 yrs ago. Denies any voiding issues. Poor appetite which started in Dec and has lost sig amt of weight. No f/c/s, no n/v/d.     Vitals:   03/18/24 1654 03/18/24 2032 03/19/24 0420 03/19/24 0444  BP: (!) 119/53 124/63  135/64  Pulse: 71 64  71  Resp: 18 18  18   Temp: 98.6 F (37 C) 98.9 F (37.2 C)  98.6 F (37 C)  TempSrc: Oral Oral  Oral  SpO2: 96% 97%  99%  Weight:   68.4 kg   Height:        Exam: Gen alert, no distress, pleasant elderly male Sclera anicteric, throat clear  Flat neck veins Chest clear bilat to bases RRR no MRG Abd soft ntnd no mass or ascites +bs, liver edge slightly palpable about 1/2 inch down Ext no LE or UE edema, no other edema Neuro is alert, Ox 3 , nf     Relevant home meds: Coreg   Losartan   Lasix  20 every day Aldactone  25 every day     Date                             Creat               eGFR (ml/min) 2010- 2013                  1.26- 1.70 2017- 2019                  1.20- 1.91 2020                            1.40-  2.31 2021- 2023                  1.42- 2.13 2024                            1.29- 1.53        45- 55 ml/min   12/21/23                      1.38 1/06  2.57                 24         1/07                             2.75 1/08                             3.20                 18             UA - clear, neg LE/ nit, prot 100, rbc 11-20, wbc 6-10, rare bact UNa 36, UCr 93 CT abd ->  showed normal appearing kidney, no stones, no hydro. No stranding. Bladder wall mildly thickened likely due to outlet obstruction ED Rx = 1 L NS bolus, then LR at 125/hr IV zosyn  No CT contrast       Assessment/ Plan:   # AKI on CKD 3a - CKD 3a w/ b/l creatinine 1.3- 1.5 from 2024- oct 2025, eGFR 45-55 ml/min - creatinine here was 2.5 on admission in setting of dizziness, wt loss, loss of appetite and low-nl bp's, new mass at head of pancreas w/ severe biliary dilatation, w/u in progress - pt rec'd some IVF bolus but appeared vol depleted on exam still  - CT showed no hydro and bladder thickened possibly d/t bladder outlet obstruction - suspect AKI due to vol depletion/ hypoperfusion +/- bladder outlet obstruction - IVFs were ^'d and foley placed - UOP improved to 800 cc/ d and yest was up at 1800 cc  - creat down minimally at 2.9 today - cont IVFs 75 cc/hr  # metabolic acidosis - resolved    # pancreatic mass/ biliary obstruction - suspected malignancy per GI notes - GI unable to do ERCP, so IR did transhepatic biliary biliary tube placement   # h/o systolic CHF     Rob Azaan Leask MD  [facility] 03/19/2024, 8:55 AM  Recent Labs  Lab 03/18/24 0425 03/19/24 0414 03/19/24 0415  HGB 8.7* 7.8*  --   ALBUMIN  2.6* 2.6* 2.6*  CALCIUM  8.8*  --  8.6*  PHOS  --   --  2.5  CREATININE 3.11*  --  2.93*  K 4.0  --  3.6   Recent Labs  Lab 03/16/24 0447  IRON  68  TIBC 213*  FERRITIN 2,730*   Inpatient medications:  Chlorhexidine  Gluconate Cloth  6 each Topical Daily    diclofenac   100 mg Rectal Once   latanoprost   1 drop Both Eyes Daily   lidocaine -EPINEPHrine   20 mL Intradermal Once   meperidine  (DEMEROL ) injection  25 mg Intravenous Once   senna  1 tablet Oral BID   sodium chloride  flush  5 mL Intracatheter Q8H    cefTRIAXone  (ROCEPHIN )  IV     lactated ringers  75 mL/hr at 03/18/24 1905   piperacillin -tazobactam (ZOSYN )  IV 2.25 g (03/19/24 0419)   sodium bicarbonate  150 mEq in sterile water  1,150 mL infusion 100 mL/hr at 03/18/24 1747   acetaminophen  **OR** acetaminophen , fentaNYL , fentaNYL , iohexol , iohexol , menthol , meperidine , meperidine , midazolam  PF, midazolam  PF, morphine  injection, ondansetron  (ZOFRAN ) IV

## 2024-03-20 DIAGNOSIS — K831 Obstruction of bile duct: Secondary | ICD-10-CM | POA: Diagnosis not present

## 2024-03-20 DIAGNOSIS — K8689 Other specified diseases of pancreas: Secondary | ICD-10-CM | POA: Diagnosis not present

## 2024-03-20 DIAGNOSIS — R748 Abnormal levels of other serum enzymes: Secondary | ICD-10-CM | POA: Diagnosis not present

## 2024-03-20 LAB — CBC
HCT: 18.8 % — ABNORMAL LOW (ref 39.0–52.0)
Hemoglobin: 7.4 g/dL — ABNORMAL LOW (ref 13.0–17.0)
MCH: 31.1 pg (ref 26.0–34.0)
MCHC: 36.7 g/dL — ABNORMAL HIGH (ref 30.0–36.0)
MCV: 84.7 fL (ref 80.0–100.0)
Platelets: 230 K/uL (ref 150–400)
RBC: 2.22 MIL/uL — ABNORMAL LOW (ref 4.22–5.81)
RDW: 17.3 % — ABNORMAL HIGH (ref 11.5–15.5)
WBC: 8.9 K/uL (ref 4.0–10.5)
nRBC: 0 % (ref 0.0–0.2)

## 2024-03-20 LAB — RENAL FUNCTION PANEL
Albumin: 2.5 g/dL — ABNORMAL LOW (ref 3.5–5.0)
Anion gap: 8 (ref 5–15)
BUN: 21 mg/dL (ref 8–23)
CO2: 29 mmol/L (ref 22–32)
Calcium: 8.7 mg/dL — ABNORMAL LOW (ref 8.9–10.3)
Chloride: 103 mmol/L (ref 98–111)
Creatinine, Ser: 2.65 mg/dL — ABNORMAL HIGH (ref 0.61–1.24)
GFR, Estimated: 23 mL/min — ABNORMAL LOW
Glucose, Bld: 98 mg/dL (ref 70–99)
Phosphorus: 3 mg/dL (ref 2.5–4.6)
Potassium: 3.4 mmol/L — ABNORMAL LOW (ref 3.5–5.1)
Sodium: 140 mmol/L (ref 135–145)

## 2024-03-20 LAB — HEPATIC FUNCTION PANEL
ALT: 117 U/L — ABNORMAL HIGH (ref 0–44)
AST: 64 U/L — ABNORMAL HIGH (ref 15–41)
Albumin: 2.5 g/dL — ABNORMAL LOW (ref 3.5–5.0)
Alkaline Phosphatase: 1049 U/L — ABNORMAL HIGH (ref 38–126)
Bilirubin, Direct: 2.1 mg/dL — ABNORMAL HIGH (ref 0.0–0.2)
Indirect Bilirubin: 0.6 mg/dL (ref 0.3–0.9)
Total Bilirubin: 2.7 mg/dL — ABNORMAL HIGH (ref 0.0–1.2)
Total Protein: 5.3 g/dL — ABNORMAL LOW (ref 6.5–8.1)

## 2024-03-20 NOTE — Progress Notes (Signed)
 "   Referring Physician(s): Perry,J  Supervising Physician: Hughes Simmonds  Patient Status:  [facility] - In-pt  Chief Complaint: Biliary obstruction, pancreatic mass; s/p I/E biliary drain placement 03/17/24   Subjective: Pt doing ok this am; denies worsening abd pain,N/V or resp difficulties   Allergies: Ace inhibitors and Entresto  [sacubitril-valsartan]  Medications: Prior to Admission medications  Medication Sig Start Date End Date Taking? Authorizing Provider  aspirin  81 MG EC tablet Take 1 tablet (81 mg total) by mouth daily. 07/28/19  Yes Meng, Hao, PA  carvedilol  (COREG ) 6.25 MG tablet Take 0.5 tablets (3.125 mg total) by mouth 2 (two) times daily with a meal. 05/28/20  Yes [name], MD  Cholecalciferol (VITAMIN D3) 1000 units CAPS Take 1,000 Units by mouth daily.   Yes [provider]  diclofenac  Sodium (VOLTAREN) 1 % GEL 2 g 4 (four) times daily as needed (for pain- left knee and any other affected area). 03/08/23  Yes [provider]  ezetimibe  (ZETIA ) 10 MG tablet Take 10 mg by mouth daily.   Yes [provider]  Ferrous Gluconate  324 (37.5 Fe) MG TABS TAKE 1 TABLET BY MOUTH TWICE DAILY WITH MEALS Patient taking differently: Take 324 mg by mouth See admin instructions. Take 324 mg by mouth with food on Mon/Wed/Fri 02/08/18  Yes Shadad, Firas N, MD  KETOTIFEN FUMARATE OP Place 1 drop into both eyes See admin instructions. Ketotifen 0.025% eye drops - Instill 1 drop into both eyes one to two times a day   Yes [provider]  latanoprost  (XALATAN ) 0.005 % ophthalmic solution Place 1 drop into both eyes at bedtime. 01/22/17  Yes [provider]  losartan  (COZAAR ) 50 MG tablet Take 50 mg by mouth 2 (two) times daily.   Yes [provider]  Nutritional Supplements (SUPLENA/CARB STEADY) LIQD Take 237 mLs by mouth See admin instructions. Drink 237 ml's of Vanilla Suplena by mouth three times a day   Yes [provider]   rosuvastatin  (CRESTOR ) 40 MG tablet TAKE 1 TABLET BY MOUTH ONCE DAILY AT  6PM Patient taking differently: Take 40 mg by mouth at bedtime. 11/07/18  Yes [name], MD  traZODone  (DESYREL ) 150 MG tablet Take 150 mg by mouth at bedtime as needed for sleep.    Yes [provider]  trolamine salicylate (ASPERCREME) 10 % cream Apply 1 Application topically as needed (for left knee pain and/or other affected areas).   Yes [provider]  ezetimibe  (ZETIA ) 10 MG tablet Take 1 tablet (10 mg total) by mouth daily. Patient not taking: Reported on 03/15/2024 12/05/18 03/15/24  Rolan Ezra RAMAN, MD  oxyCODONE -acetaminophen  (PERCOCET/ROXICET) 5-325 MG tablet Take 1 tablet by mouth every 6 (six) hours as needed for severe pain (pain score 7-10). Patient not taking: Reported on 03/15/2024 03/09/23   Henderly, Britni A, PA-C  spironolactone  (ALDACTONE ) 25 MG tablet Take 1 tablet (25 mg total) by mouth daily. Patient not taking: Reported on 03/15/2024 12/06/18 03/15/24  Rolan Ezra RAMAN, MD     Vital Signs: BP 125/65 (BP Location: Right Arm)   Pulse 70   Temp 98.5 F (36.9 C) (Oral)   Resp 18   Ht 5' 10 (1.778 m)   Wt 147 lb 14.9 oz (67.1 kg)   SpO2 99%   BMI 21.23 kg/m   Physical Exam: awake/alert; rt biliary drain intact, insertion site ok, not sig tender, OP 1.5 liters yesterday green bile; drain flushed without difficulty  Imaging: IR INT EXT  BILIARY DRAIN WITH CHOLANGIOGRAM Result Date: 03/18/2024 INDICATION: Biliary dilatation.  Aborted ERCP. Briefly, 84 year old male with distal CBD obstruction, suspected malignant. ERCP aborted secondary to inability to cannulate biliary tree. EXAM: Procedures; 1. PERCUTANEOUS TRANSHEPATIC CHOLANGIOGRAM 2. ULTRASOUND AND FLUOROSCOPIC GUIDED BILIARY DRAINAGE TUBE PLACEMENT COMPARISON:  MRCP, 03/15/2024. ERCP fluoroscopy, earlier same day. CT AP, 03/14/2024. MEDICATIONS: Rocephin  2 gm IV; The antibiotic was administered with an appropriate time frame  prior to the initiation of the procedure 25 mg Demerol  IV CONTRAST:  20 mL Omnipaque  300-administered into the biliary tree. ANESTHESIA/SEDATION: Moderate (conscious) sedation was employed during this procedure. A total of Versed  2 mg and Fentanyl  100 mcg was administered intravenously. Moderate Sedation Time: 40 minutes. The patient's level of consciousness and vital signs were monitored continuously by radiology nursing throughout the procedure under my direct supervision. FLUOROSCOPY: Radiation Exposure Index and estimated peak skin dose (PSD); Reference air kerma (RAK), 41 mGy. COMPLICATIONS: None immediate. TECHNIQUE: Informed written consent was obtained from the patient and/or patient's representative after a discussion of the risks, benefits and alternatives to treatment. Questions regarding the procedure were encouraged and answered. A timeout was performed prior to the initiation of the procedure. The RIGHT upper abdominal quadrant was prepped and draped in the usual sterile fashion, and a sterile drape was applied covering the operative field. Maximum barrier sterile technique with sterile gowns and gloves were used for the procedure. A timeout was performed prior to the initiation of the procedure. Ultrasound scanning of the right upper abdominal quadrant was performed to delineate the anatomy and avoid transgression of the gallbladder or the pleura. After the overlying soft tissues were anesthetized with 1% Lidocaine  with epinephrine , under direct ultrasound guidance, a 22 gauge Chiba needle was utilized to cannulate a peripheral aspect of an intrahepatic biliary duct. A RIGHT-sided approach distal to the biliary hilum was targeted. Appropriate position was confirmed with limited contrast injection. Next, the duct was cannulated with a Nitrex wire and dilated with an Accustick set under fluoroscopic guidance. Limited cholangiograms were performed in various obliquities confirming appropriate access.  Next, a 4 Fr angled glide catheter was advanced through the outer sheath of the Accustick set and with the use of a stiff Glidewire, advanced through the biliary hilum, common bile duct and ampulla to the level of the duodenum. Contrast injection confirmed appropriate positioning. Under intermittent fluoroscopic guidance and over an Amplatz wire, the track was dilated ultimately allowing placement of a 10 Fr biliary drainage catheter with coil ultimately locked within the duodenum. Contrast was injected and a completion radiographs were obtained in various obliquities. The catheter was connected to a drainage bag which yielded the brisk return of clear bile. The catheter was secured to the skin with an interrupted suture and StatLock device. Dressings were applied. The patient tolerated the procedure well without immediate postprocedural complication. FINDINGS: *Sonographic evaluation of the liver demonstrates intrahepatic biliary ductal dilatation as well as gallbladder distention. *Under direct ultrasound guidance, a dilated duct distal to the hilum from a RIGHT-sided approach was accessed *Placement of a 10 Fr biliary drainage catheter with end coiled and locked within the duodenum and radiopaque marker located proximal to the level of the biliary hilum. *Limited contrast injection demonstrates dilatation of the biliary tree, with obstruction at the distal the CBD. *20 mL of bile was submitted for microbiological analysis. IMPRESSION: 1. Successful placement of a 10 Fr percutaneous biliary drainage catheter via a RIGHT transhepatic approach, with end coiled and locked within the duodenum. 2. Distal common  bile duct obstruction, with shouldering suspicious for malignancy. Bile submitted for microbiological and cytopathological analysis. RECOMMENDATIONS: The patient will return to Vascular Interventional Radiology (VIR) for routine drainage catheter evaluation and exchange in 8 weeks. Thom Hall, MD [facility] Radiology Electronically Signed   By: Thom Hall M.D.   On: 03/18/2024 07:40   DG ERCP Result Date: 03/17/2024 CLINICAL DATA:  [id number] Surgery, elective [id number] EXAM: ERCP COMPARISON:  CT AP, 03/14/2024.  MRCP, 03/15/2024. FLUOROSCOPY: Exposure Index (as provided by the fluoroscopic device): 50.1 mGy Kerma FINDINGS: Limited oblique planar images of the RIGHT upper quadrant obtained C-arm. Images demonstrating flexible endoscopy. No submitted images for retrograde cholangiogram., IMPRESSION: Fluoroscopic imaging for ERCP. No submitted images for retrograde cholangiogram For complete description of intra procedural findings, please see performing service dictation. Electronically Signed   By: Thom Hall M.D.   On: 03/17/2024 13:53    Labs:  CBC: Recent Labs    03/17/24 0433 03/18/24 0425 03/19/24 0414 03/20/24 0355  WBC 6.5 5.3 11.3* 8.9  HGB 8.1* 8.7* 7.8* 7.4*  HCT 21.8* 23.3* 20.8* 18.8*  PLT 238 244 240 230    COAGS: Recent Labs    03/14/24 1730 03/16/24 0447  INR 1.0 1.1    BMP: Recent Labs    03/17/24 0433 03/18/24 0425 03/19/24 0415 03/20/24 0355  NA 141 139 138 140  K 3.7 4.0 3.6 3.4*  CL 109 101 100 103  CO2 22 29 29 29   GLUCOSE 89 224* 101* 98  BUN 16 24* 23 21  CALCIUM  8.9 8.8* 8.6* 8.7*  CREATININE 3.09* 3.11* 2.93* 2.65*  GFRNONAA 19* 19* 20* 23*    LIVER FUNCTION TESTS: Recent Labs    03/17/24 0433 03/18/24 0425 03/19/24 0414 03/19/24 0415 03/20/24 0355  BILITOT 7.8* 4.9* 3.1*  --  2.7*  AST 260* 234* 103*  --  64*  ALT 228* 227* 155*  --  117*  ALKPHOS 1,545* 1,560* 1,175*  --  1,049*  PROT 5.1* 5.7* 5.3*  --  5.3*  ALBUMIN  2.6* 2.6* 2.6* 2.6* 2.5*  2.5*    Assessment and Plan: 84 y.o. male with past medical history significant for anemia, chronic kidney disease, coronary artery disease with prior CABG, cardiomyopathy, MGUS, hypertension, CHF, glaucoma, hyperlipidemia, osteoarthritis who was  admitted to [facility] on 1/7 with weight loss, lightheadedness, low blood pressure, elevated liver function tests, obstructive jaundice with associated pancreatic head mass, biliary and pancreatic duct dilatation concerning for pancreatic adenocarcinoma, mild gallbladder distention and mild gallbladder wall thickening; s/p right I/E biliary drain placement on 03/17/24; afebrile, WBC nl, hgb 7.4(7.8),  creat 2.65(2.93), K 3.4, t bili 2.7(3.1); bile cyt pend, bile cx neg to date; cont tid drain irrigation, lab checks, close output monitoring, for f/u cholangiogram/drain exchange in 8 weeks- order placed. Other plans as per TRH/GI/NEPH         Electronically Signed: D. Franky Rakers, PA-C 03/20/2024, 9:40 AM   I spent a total of 15 Minutes at the the patient's bedside AND on the patient's hospital floor or unit, greater than 50% of which was counseling/coordinating care for biliary drain    Patient ID: Luis Chen, male   DOB: 1941-01-29, 84 y.o.   MRN: [id number]  "

## 2024-03-20 NOTE — Progress Notes (Signed)
 Mobility Specialist Progress Note:   03/20/24 1553  Mobility  Activity Ambulated independently  Level of Assistance Contact guard assist, steadying assist  Assistive Device None  Distance Ambulated (ft) 500 ft  Activity Response Tolerated well  Mobility Referral Yes  Mobility visit 1 Mobility  Mobility Specialist Start Time (ACUTE ONLY) 1409  Mobility Specialist Stop Time (ACUTE ONLY) 1423  Mobility Specialist Time Calculation (min) (ACUTE ONLY) 14 min   Pt was received in recliner and agreed to mobility. No complaints/issues during ambulation. Returned to recliner with all needs met. Call bell in reach.  [employer] - Mobility Specialist

## 2024-03-20 NOTE — Progress Notes (Signed)
 Mobility Specialist Progress Note:   03/20/24 1052  Mobility  Activity Ambulated independently;Ambulated with assistance  Level of Assistance Contact guard assist, steadying assist  Assistive Device Front wheel walker  Distance Ambulated (ft) 550 ft  Activity Response Tolerated well  Mobility Referral Yes  Mobility visit 1 Mobility  Mobility Specialist Start Time (ACUTE ONLY) 1022  Mobility Specialist Stop Time (ACUTE ONLY) 1041  Mobility Specialist Time Calculation (min) (ACUTE ONLY) 19 min   Pt was received in bed and agreed to mobility. Pt became unsteady after 10 ft ind., instructed the use of RW for safety. No complaints/issues during ambulation. Returned to recliner with all needs met. Call bell in reach.   [employer] - Mobility Specialist

## 2024-03-20 NOTE — Progress Notes (Signed)
 " [facility] Progress Note    Assessment/ Plan:   AKI on CKD 3a - CKD 3a w/ b/l creatinine 1.3- 1.5 from 2024- oct 2025, eGFR 45-55 ml/min. Followed at our office for CKD care - creatinine here was 2.5 on admission in setting of dizziness, wt loss, loss of appetite and low-nl bp's, new mass at head of pancreas w/ severe biliary dilatation, w/u in progress - CT showed no hydro and bladder thickened possibly d/t bladder outlet obstruction - suspect AKI due to vol depletion/ hypoperfusion +/- bladder outlet obstruction -s/p foley placement, will need urology follow up on output or TOV prior to d/c -Cr improved with supportive care (fluids), down to 2.65   Metabolic acidosis - resolved    Pancreatic mass/ biliary obstruction - suspected malignancy per GI notes - GI unable to do ERCP, so IR did transhepatic biliary biliary tube placement. Per primary service  Elevated LFTs -improving, per primary  Anemia -hold ESA in the context of potential malignancy -transfuse prn for hgb <7  Nothing else to add at this time from an inpatient nephrology perspective. Will sign off. Please call with any questions/concerns in the interim. He is followed at our office, will need HFU follow up with us  upon discharge. Please contact us  when he is being discharged.  Discussed with daughter at the bedside.  Ephriam Stank, MD [facility]   Subjective:   Patient seen in room. UOP ~2.8L. Fluids stopped. Feels well, no complaints. Working on drinking water    Objective:   BP 125/65 (BP Location: Right Arm)   Pulse 70   Temp 98.5 F (36.9 C) (Oral)   Resp 18   Ht 5' 10 (1.778 m)   Wt 67.1 kg   SpO2 99%   BMI 21.23 kg/m   Intake/Output Summary (Last 24 hours) at 03/20/2024 1233 Last data filed at 03/20/2024 1000 Gross per 24 hour  Intake 513 ml  Output 3705 ml  Net -3192 ml   Weight change: -1.3 kg  Physical Exam: Gen: nad, sitting in chair CVS: RRR Resp:  unlabored, normal wob Abd: soft, nt/nd Ext: no edema Neuro: awake, alert, speech clear and coherent  Imaging: No results found.  Labs: BMET Recent Labs  Lab 03/14/24 1730 03/15/24 0947 03/16/24 0447 03/17/24 0433 03/18/24 0425 03/19/24 0415 03/20/24 0355  NA 137 139 139 141 139 138 140  K 4.5 4.3 4.0 3.7 4.0 3.6 3.4*  CL 103 109 108 109 101 100 103  CO2 21* 18* 20* 22 29 29 29   GLUCOSE 147* 127* 124* 89 224* 101* 98  BUN 27* 25* 22 16 24* 23 21  CREATININE 2.57* 2.75* 3.20* 3.09* 3.11* 2.93* 2.65*  CALCIUM  9.7 8.9 9.0 8.9 8.8* 8.6* 8.7*  PHOS  --   --   --   --   --  2.5 3.0   CBC Recent Labs  Lab 03/14/24 1730 03/16/24 0447 03/17/24 0433 03/18/24 0425 03/19/24 0414 03/20/24 0355  WBC 6.1   < > 6.5 5.3 11.3* 8.9  NEUTROABS 3.9  --   --   --   --   --   HGB 9.2*   < > 8.1* 8.7* 7.8* 7.4*  HCT 26.0*   < > 21.8* 23.3* 20.8* 18.8*  MCV 88.7   < > 84.5 84.1 82.9 84.7  PLT 258   < > 238 244 240 230   < > = values in this interval not displayed.    Medications:  Chlorhexidine  Gluconate Cloth  6 each Topical Daily   diclofenac   100 mg Rectal Once   heparin  injection (subcutaneous)  5,000 Units Subcutaneous Q8H   latanoprost   1 drop Both Eyes Daily   lidocaine -EPINEPHrine   20 mL Intradermal Once   meperidine  (DEMEROL ) injection  25 mg Intravenous Once   senna  1 tablet Oral BID   sodium chloride  flush  5 mL Intracatheter Q8H      Ephriam Stank, MD [facility] 03/20/2024, 12:33 PM   "

## 2024-03-20 NOTE — Plan of Care (Signed)

## 2024-03-20 NOTE — Progress Notes (Signed)
 "   Progress Note  Primary GI: Unassigned, LB PCP, Dr. Abran consulted  LOS: 5 days   Chief Complaint: Obstructive jaundice   Subjective   Patient reports he is doing well today.  Has some generalized abdominal soreness that has improved from yesterday.  Tolerating diet without difficulty.  Daughter is in the room.  S/p PERC biliary drain by IR 1/9 with 1,500 output since 8am 1/11 (yesterday)  AST 60/ALT 117/alk phos 1049 Yesterday AST 103/ALT 155/alk phos 1175  Total bilirubin 2.7 (3.1 yesterday)   Objective   Vital signs in last 24 hours: Temp:  [98.4 F (36.9 C)-99.6 F (37.6 C)] 98.5 F (36.9 C) (01/12 0425) Pulse Rate:  [70-78] 70 (01/12 0425) Resp:  [18] 18 (01/12 0425) BP: (125-138)/(65-70) 125/65 (01/12 0425) SpO2:  [98 %-99 %] 99 % (01/12 0425) Weight:  [67.1 kg] 67.1 kg (01/12 0500) Last BM Date : 03/18/24 Last BM recorded by nurses in past 5 days Stool Type: Type 6 (Mushy consistency with ragged edges) (03/17/2024 10:15 AM)  General:   male in no acute distress  Heart:  Regular rate and rhythm; no murmurs Pulm: Clear anteriorly; no wheezing Abdomen: soft, nondistended, normal bowel sounds in all quadrants. Nontender without guarding. No organomegaly appreciated. Biliary drain noted, empty (recently emptied before my arrival) Extremities:  No edema Neurologic:  Alert and  oriented x4;  No focal deficits.  Psych:  Cooperative. Normal mood and affect.  Intake/Output from previous day: 01/11 0701 - 01/12 0700 In: 880 [P.O.:720; I.V.:5; IV Piggyback:150] Out: 4355 [Urine:2855; Drains:1500] Intake/Output this shift: No intake/output data recorded.  Studies/Results: No results found.  Lab Results: Recent Labs    03/18/24 0425 03/19/24 0414 03/20/24 0355  WBC 5.3 11.3* 8.9  HGB 8.7* 7.8* 7.4*  HCT 23.3* 20.8* 18.8*  PLT 244 240 230   BMET Recent Labs    03/18/24 0425 03/19/24 0415 03/20/24 0355  NA 139 138 140  K 4.0 3.6 3.4*  CL 101 100 103   CO2 29 29 29   GLUCOSE 224* 101* 98  BUN 24* 23 21  CREATININE 3.11* 2.93* 2.65*  CALCIUM  8.8* 8.6* 8.7*   LFT Recent Labs    03/20/24 0355  PROT 5.3*  ALBUMIN  2.5*  2.5*  AST 64*  ALT 117*  ALKPHOS 1,049*  BILITOT 2.7*  BILIDIR 2.1*  IBILI 0.6   PT/INR No results for input(s): LABPROT, INR in the last 72 hours.   Scheduled Meds:  Chlorhexidine  Gluconate Cloth  6 each Topical Daily   diclofenac   100 mg Rectal Once   heparin  injection (subcutaneous)  5,000 Units Subcutaneous Q8H   latanoprost   1 drop Both Eyes Daily   lidocaine -EPINEPHrine   20 mL Intradermal Once   meperidine  (DEMEROL ) injection  25 mg Intravenous Once   senna  1 tablet Oral BID   sodium chloride  flush  5 mL Intracatheter Q8H   Continuous Infusions:  cefTRIAXone  (ROCEPHIN )  IV     piperacillin -tazobactam (ZOSYN )  IV 2.25 g (03/20/24 0423)      Patient profile:   84 year old male admitted 03/15/2024 for weight loss, elevated LFTs, obstructive jaundice with associated pancreatic mass, biliary and pancreatic duct dilation concerning for pancreatic adenocarcinoma  CTAP showed fullness in the head of the pancreas measuring 2.5 cm in diameter with associated pancreatic and bile duct dilation  MRCP with 3.3 x 2.3 x 2.1 cm pancreatic head mass associated with several dorsal pancreatic duct and biliary dilation, mild gallbladder distention and mild gallbladder wall thickening  Initial bilirubin 8.2 1/6 --> now 2.7  ERCP 03/14/24 1. The side- viewing endoscope was passed blindly into the esophagus. There were no gross abnormalities of the gastric or duodenal mucosa.  2. There was mild compression of the duodenum at the bulb/ D2 junction, presumably due to tumor  3. There was partial opacification of the bile duct which appeared to have a 1. 5 to 2 cm stricture with proximal dilation  4. Despite numerous attempts, the hydrophilic guidewire would not find its way into the proximal bile duct. At one juncture  there was submucosal injection with wire tracking submucosally.  5. No manipulation or opacification of the pancreatic duct The procedure was aborted   Impression/Plan:   Obstructive jaundice Pancreatic mass CA 19-9 2,010 LFTs improving No leukocytosis S/p ERCP 03/14/2024 with failed biliary drainage and biliary obstruction likely secondary to cancer pancreatic head with mild compression of duodenum at bulb/D2 junction presumably due to tumor and bile duct stricture with proximal dilation S/p PERC biliary drain by IR 1/9 with 1,579mL output since 8am 1/11 (yesterday) per documentation Cytopathology pending - If cytopathology positive, refer to oncology - If cytopathology negative, consider EUS with biopsy - Continue to trend LFTs -- Continue supportive care  MGUS with normocytic anemia Likely multifactorial in addition to CKD Iron  68, TIBC 213 Hgb 7.4 (7.8 yesterday) No over GI bleeding -- Continue daily CBC and transfuse as needed to maintain HGB > 7   Coronary artery disease s/p 3 vessel CABG 02/2018.  No angina.   Nonischemic cardiomyopathy. LV EF 50 - 55% per ECHO 05/2021.  AKI on CKD Nephrology following  Nestor CHRISTELLA Blower  03/20/2024, 9:06 AM    "

## 2024-03-20 NOTE — Progress Notes (Signed)
 " Luis Chen  FMW:[id number] DOB: 10-25-1940 DOA: 03/14/2024 PCP: [name], MD  Brief Narrative/Hospital Course: Luis Chen is a 84 y.o. male with PMH of CKD stage III, CAD, MGUS, HLD who presented with lightheadedness and low blood pressure, and weight loss over the last few weeks. Blood work in the ED showed elevated LFTs, significantly elevated alkaline phosphatase and bilirubin. CT abdomen/pelvis showed concern for pancreatic mass , labs showed AKI.  Gastroenterology and nephrology IR has been consulted.   S/p ERCP but unable to pass guidewire, now s/p perc biliary drain by IR.   Subjective: Seen and examined  Patient resting comfortably family at the bedside Overall remains afebrile BP stable on room air  Labs w/ Alk phos AST ALT downtrending, TB down 7.8> 4.9> 3.1> 2.7 alp 1049., biliary drain 1500 cc-chest flushed by IR  Assessment and plan:   Obstructive jaundice due to pancreatic head mass: MRCP-pancreatic head mass associated with severe dorsal pancreatic duct and biliary dilation-GI consulted underwent ERCP-unable to pass guidewire, now s/p perc biliary drain by IR. CA 19-9 levels elevated 2010, cytology pending.  Monitor LFTs, biliary drain output, continue flush and drain care as per IR.  Awaiting for cytology report to decide further plan>  If brushing cytology negativ ? EUS if he is a candidate for palliative oncologic care Continue on Zosyn , and trend lfts which is overall impropving Recent Labs  Lab 03/14/24 1730 03/16/24 0447 03/17/24 0433 03/18/24 0425 03/19/24 0414 03/19/24 0415 03/20/24 0355  AST 222* 255* 260* 234* 103*  --  64*  ALT 285* 236* 228* 227* 155*  --  117*  ALKPHOS 1,876* 1,691* 1,545* 1,560* 1,175*  --  1,049*  BILITOT 8.2* 7.4* 7.8* 4.9* 3.1*  --  2.7*  PROT 6.7 5.7* 5.1* 5.7* 5.3*  --  5.3*  ALBUMIN  3.3* 2.7* 2.6* 2.6* 2.6* 2.6* 2.5*  2.5*  INR 1.0 1.1  --   --   --   --   --     AKI on CKD 3a Metabolic  acidosis: B/l creat~1.3-1.5 in 2024 and October 2025.CT showed no hydro and bladder thickening possibly due to bladder outlet obstruction likely volume depletion and bladder outlet obstruction contributed. Continue Foley, monitor output.  Bicarb improved, nephrology following, off IV fluids. Recent Labs    06/15/23 1116 12/21/23 1307 03/14/24 1730 03/15/24 0947 03/16/24 0447 03/17/24 0433 03/18/24 0425 03/19/24 0415 03/20/24 0355  BUN 17 20 27* 25* 22 16 24* 23 21  CREATININE 1.31* 1.38* 2.57* 2.75* 3.20* 3.09* 3.11* 2.93* 2.65*  CO2 23 27 21* 18* 20* 22 29 29 29   K 4.4 4.4 4.5 4.3 4.0 3.7 4.0 3.6 3.4*    Normocytic anemia: Remains stable transfuse if less than 7 g suspect in the setting of #1 Recent Labs  Lab 03/16/24 0447 03/17/24 0433 03/18/24 0425 03/19/24 0414 03/20/24 0355  HGB 8.4* 8.1* 8.7* 7.8* 7.4*  HCT 23.1* 21.8* 23.3* 20.8* 18.8*    CAD: Antiplatelets on hold resume once okay postprocedure by GI or IR  Glaucoma: Continue eyedrops  Prediabetes with Hyperglycemia: A1c 6.2, continue diet control  Moderate malnutrition with Weight loss, low BMI Body mass index is 21.23 kg/m.: Augment diet as able.    Mobility: PT Orders: Active PT Follow up Rec: No Pt Follow Up1/10/2024 1354   DVT prophylaxis: heparin  injection 5,000 Units Start: 03/19/24 2200 SCDs Start: 03/15/24 1637 Code Status:   Code Status: Full Code Family Communication: plan of care discussed with  patient and son at the bedside. Patient status is: Remains hospitalized because of severity of illness Level of care: Telemetry   Dispo: The patient is from: home            Anticipated disposition: TBD Objective: Vitals last 24 hrs: Vitals:   03/19/24 1500 03/19/24 2124 03/20/24 0425 03/20/24 0500  BP: 126/70 138/66 125/65   Pulse: 75 78 70   Resp:  18 18   Temp: 98.4 F (36.9 C) 99.6 F (37.6 C) 98.5 F (36.9 C)   TempSrc: Oral Oral Oral   SpO2:  98% 99%   Weight:    67.1 kg  Height:        Physical Examination: General exam: AAOX3 pleasant and not in distress HEENT:Oral mucosa moist, Ear/Nose WNL grossly Respiratory system: Bilaterally clear Cardiovascular system: S1 & S2 +, No JVD. Gastrointestinal system: Abdomen soft, tender,ND, BS+ biliary drain+ Nervous System: Alert, awake, moving all extremities,and following commands. Extremities: extremities warm, leg edema neg Skin: Warm, no rashes MSK: Normal muscle bulk,tone, power   Medications reviewed:  Scheduled Meds:  Chlorhexidine  Gluconate Cloth  6 each Topical Daily   diclofenac   100 mg Rectal Once   heparin  injection (subcutaneous)  5,000 Units Subcutaneous Q8H   latanoprost   1 drop Both Eyes Daily   lidocaine -EPINEPHrine   20 mL Intradermal Once   meperidine  (DEMEROL ) injection  25 mg Intravenous Once   senna  1 tablet Oral BID   sodium chloride  flush  5 mL Intracatheter Q8H   Continuous Infusions:  cefTRIAXone  (ROCEPHIN )  IV     piperacillin -tazobactam (ZOSYN )  IV 2.25 g (03/20/24 0423)   Diet: Diet Order             Diet Heart Room service appropriate? Yes; Fluid consistency: Thin  Diet effective now                   Data Reviewed: I have personally reviewed following labs and imaging studies ( see epic result tab) CBC: Recent Labs  Lab 03/14/24 1730 03/16/24 0447 03/17/24 0433 03/18/24 0425 03/19/24 0414 03/20/24 0355  WBC 6.1 5.7 6.5 5.3 11.3* 8.9  NEUTROABS 3.9  --   --   --   --   --   HGB 9.2* 8.4* 8.1* 8.7* 7.8* 7.4*  HCT 26.0* 23.1* 21.8* 23.3* 20.8* 18.8*  MCV 88.7 85.9 84.5 84.1 82.9 84.7  PLT 258 249 238 244 240 230   CMP: Recent Labs  Lab 03/16/24 0447 03/17/24 0433 03/18/24 0425 03/19/24 0415 03/20/24 0355  NA 139 141 139 138 140  K 4.0 3.7 4.0 3.6 3.4*  CL 108 109 101 100 103  CO2 20* 22 29 29 29   GLUCOSE 124* 89 224* 101* 98  BUN 22 16 24* 23 21  CREATININE 3.20* 3.09* 3.11* 2.93* 2.65*  CALCIUM  9.0 8.9 8.8* 8.6* 8.7*  PHOS  --   --   --  2.5 3.0   GFR:  Estimated Creatinine Clearance: 19.3 mL/min (A) (by C-G formula based on SCr of 2.65 mg/dL (H)). Recent Labs  Lab 03/16/24 0447 03/17/24 0433 03/18/24 0425 03/19/24 0414 03/19/24 0415 03/20/24 0355  AST 255* 260* 234* 103*  --  64*  ALT 236* 228* 227* 155*  --  117*  ALKPHOS 1,691* 1,545* 1,560* 1,175*  --  1,049*  BILITOT 7.4* 7.8* 4.9* 3.1*  --  2.7*  PROT 5.7* 5.1* 5.7* 5.3*  --  5.3*  ALBUMIN  2.7* 2.6* 2.6* 2.6* 2.6* 2.5*  2.5*  Recent Labs  Lab 03/14/24 1850  LIPASE 62*   No results for input(s): AMMONIA in the last 168 hours. Coagulation Profile:  Recent Labs  Lab 03/14/24 1730 03/16/24 0447  INR 1.0 1.1   Unresulted Labs (From admission, onward)     Start     Ordered   03/19/24 0500  Hepatic function panel  Daily,   R      03/18/24 1159   03/18/24 0500  Renal function panel  Daily,   R      03/17/24 1400   03/16/24 1719  Protein electrophoresis, serum  Once,   R        03/16/24 1718           Antimicrobials/Microbiology: Anti-infectives (From admission, onward)    Start     Dose/Rate Route Frequency Ordered Stop   03/17/24 1615  cefTRIAXone  (ROCEPHIN ) 2 g in sodium chloride  0.9 % 100 mL IVPB        2 g 200 mL/hr over 30 Minutes Intravenous  Once 03/17/24 1517     03/15/24 2000  piperacillin -tazobactam (ZOSYN ) IVPB 2.25 g        2.25 g 100 mL/hr over 30 Minutes Intravenous Every 8 hours 03/15/24 1555     03/15/24 0800  piperacillin -tazobactam (ZOSYN ) IVPB 3.375 g  Status:  Discontinued        3.375 g 12.5 mL/hr over 240 Minutes Intravenous Every 12 hours 03/15/24 0520 03/15/24 1555   03/15/24 0600  piperacillin -tazobactam (ZOSYN ) IVPB 2.25 g  Status:  Discontinued       Placed in Followed by Linked Group   2.25 g 100 mL/hr over 30 Minutes Intravenous Every 6 hours 03/14/24 2217 03/15/24 0519   03/14/24 2230  piperacillin -tazobactam (ZOSYN ) IVPB 3.375 g       Placed in Followed by Linked Group   3.375 g 100 mL/hr over 30 Minutes Intravenous   Once 03/14/24 2217 03/15/24 9394         Component Value Date/Time   SDES  03/17/2024 1758    BILE Performed at [facility], [address], [place], [place] [address]    SPECREQUEST  03/17/2024 1758    NONE Performed at [facility], [address] [place], [place] [address]    CULT  03/17/2024 1758    NO GROWTH 3 DAYS NO ANAEROBES ISOLATED; CULTURE IN Luis FOR 5 DAYS Performed at [facility], [address], [place], [place] [address]    REPTSTATUS PENDING 03/17/2024 1758    Procedures: Procedures (LRB): ERCP, WITH INTERVENTION IF INDICATED (N/A)   Mennie LAMY, MD [facility] 03/20/2024, 10:51 AM   "

## 2024-03-21 ENCOUNTER — Ambulatory Visit: Payer: Self-pay | Admitting: Internal Medicine

## 2024-03-21 DIAGNOSIS — R748 Abnormal levels of other serum enzymes: Secondary | ICD-10-CM | POA: Diagnosis not present

## 2024-03-21 DIAGNOSIS — K831 Obstruction of bile duct: Secondary | ICD-10-CM | POA: Diagnosis not present

## 2024-03-21 DIAGNOSIS — K8689 Other specified diseases of pancreas: Secondary | ICD-10-CM | POA: Diagnosis not present

## 2024-03-21 LAB — PROTEIN ELECTROPHORESIS, SERUM
A/G Ratio: 0.8 (ref 0.7–1.7)
Albumin ELP: 2.2 g/dL — ABNORMAL LOW (ref 2.9–4.4)
Alpha-1-Globulin: 0.3 g/dL (ref 0.0–0.4)
Alpha-2-Globulin: 0.7 g/dL (ref 0.4–1.0)
Beta Globulin: 0.7 g/dL (ref 0.7–1.3)
Gamma Globulin: 1 g/dL (ref 0.4–1.8)
Globulin, Total: 2.7 g/dL (ref 2.2–3.9)
M-Spike, %: 0.3 g/dL — ABNORMAL HIGH
Total Protein ELP: 4.9 g/dL — ABNORMAL LOW (ref 6.0–8.5)

## 2024-03-21 LAB — HEPATIC FUNCTION PANEL
ALT: 99 U/L — ABNORMAL HIGH (ref 0–44)
AST: 49 U/L — ABNORMAL HIGH (ref 15–41)
Albumin: 2.6 g/dL — ABNORMAL LOW (ref 3.5–5.0)
Alkaline Phosphatase: 879 U/L — ABNORMAL HIGH (ref 38–126)
Bilirubin, Direct: 1.9 mg/dL — ABNORMAL HIGH (ref 0.0–0.2)
Indirect Bilirubin: 0.5 mg/dL (ref 0.3–0.9)
Total Bilirubin: 2.4 mg/dL — ABNORMAL HIGH (ref 0.0–1.2)
Total Protein: 5.5 g/dL — ABNORMAL LOW (ref 6.5–8.1)

## 2024-03-21 LAB — CBC
HCT: 19.5 % — ABNORMAL LOW (ref 39.0–52.0)
Hemoglobin: 7.2 g/dL — ABNORMAL LOW (ref 13.0–17.0)
MCH: 31.4 pg (ref 26.0–34.0)
MCHC: 36.9 g/dL — ABNORMAL HIGH (ref 30.0–36.0)
MCV: 85.2 fL (ref 80.0–100.0)
Platelets: 238 K/uL (ref 150–400)
RBC: 2.29 MIL/uL — ABNORMAL LOW (ref 4.22–5.81)
RDW: 17.4 % — ABNORMAL HIGH (ref 11.5–15.5)
WBC: 8.6 K/uL (ref 4.0–10.5)
nRBC: 0 % (ref 0.0–0.2)

## 2024-03-21 LAB — CYTOLOGY - NON PAP

## 2024-03-21 MED ORDER — ORAL CARE MOUTH RINSE: 15.0000 mL | OROMUCOSAL | Status: DC | PRN

## 2024-03-21 NOTE — Progress Notes (Addendum)
 "   Progress Note  Primary GI: Unassigned, LB PCP, Dr. Abran consulted   LOS: 6 days   Chief Complaint: Obstructive jaundice   Subjective   Daughter in the room today.  Patient doing well with no changes compared to yesterday.  Having no pain.  Tolerating diet without difficulty.  LFTs continue to improve.  Cytology still pending. Had a bowel movement this morning, normal.  S/p PERC biliary drain by IR 1/9 with 1,250 output since 10am 1/12    Objective   Vital signs in last 24 hours: Temp:  [98.2 F (36.8 C)-98.3 F (36.8 C)] 98.2 F (36.8 C) (01/13 0432) Pulse Rate:  [79-82] 79 (01/13 0432) Resp:  [17-20] 18 (01/13 0432) BP: (120-127)/(57-75) 127/57 (01/13 0432) SpO2:  [97 %-100 %] 99 % (01/13 0432) Weight:  [67.5 kg] 67.5 kg (01/13 0432) Last BM Date : 03/21/24 Last BM recorded by nurses in past 5 days Stool Type: Type 5 (Soft blobs with clear-cut edges) (03/20/2024  1:45 PM)  General:   male in no acute distress  Heart:  Regular rate and rhythm; no murmurs Pulm: Clear anteriorly; no wheezing Abdomen: soft, nondistended, normal bowel sounds in all quadrants. Nontender without guarding. No organomegaly appreciated. Extremities:  No edema Neurologic:  Alert and  oriented x4;  No focal deficits.  Psych:  Cooperative. Normal mood and affect.  Intake/Output from previous day: 01/12 0701 - 01/13 0700 In: 363 [P.O.:358; I.V.:5] Out: 3250 [Urine:2250; Drains:1000] Intake/Output this shift: Total I/O In: 240 [P.O.:240] Out: 225 [Drains:225]  Studies/Results: No results found.  Lab Results: Recent Labs    03/19/24 0414 03/20/24 0355 03/21/24 0405  WBC 11.3* 8.9 8.6  HGB 7.8* 7.4* 7.2*  HCT 20.8* 18.8* 19.5*  PLT 240 230 238   BMET Recent Labs    03/19/24 0415 03/20/24 0355  NA 138 140  K 3.6 3.4*  CL 100 103  CO2 29 29  GLUCOSE 101* 98  BUN 23 21  CREATININE 2.93* 2.65*  CALCIUM  8.6* 8.7*   LFT Recent Labs    03/21/24 0405  PROT 5.5*  ALBUMIN   2.6*  AST 49*  ALT 99*  ALKPHOS 879*  BILITOT 2.4*  BILIDIR 1.9*  IBILI 0.5   PT/INR No results for input(s): LABPROT, INR in the last 72 hours.   Scheduled Meds:  Chlorhexidine  Gluconate Cloth  6 each Topical Daily   diclofenac   100 mg Rectal Once   heparin  injection (subcutaneous)  5,000 Units Subcutaneous Q8H   latanoprost   1 drop Both Eyes Daily   lidocaine -EPINEPHrine   20 mL Intradermal Once   meperidine  (DEMEROL ) injection  25 mg Intravenous Once   senna  1 tablet Oral BID   sodium chloride  flush  5 mL Intracatheter Q8H   Continuous Infusions:  piperacillin -tazobactam (ZOSYN )  IV 2.25 g (03/21/24 0450)      Patient profile:   84 year old male admitted 03/15/2024 for weight loss, elevated LFTs, obstructive jaundice with associated pancreatic mass, biliary and pancreatic duct dilation concerning for pancreatic adenocarcinoma   CTAP showed fullness in the head of the pancreas measuring 2.5 cm in diameter with associated pancreatic and bile duct dilation   MRCP with 3.3 x 2.3 x 2.1 cm pancreatic head mass associated with several dorsal pancreatic duct and biliary dilation, mild gallbladder distention and mild gallbladder wall thickening   Initial bilirubin 8.2 1/6 --> now 2.7   ERCP 03/14/24 1. The side- viewing endoscope was passed blindly into the esophagus. There were no gross abnormalities of  the gastric or duodenal mucosa.  2. There was mild compression of the duodenum at the bulb/ D2 junction, presumably due to tumor  3. There was partial opacification of the bile duct which appeared to have a 1. 5 to 2 cm stricture with proximal dilation  4. Despite numerous attempts, the hydrophilic guidewire would not find its way into the proximal bile duct. At one juncture there was submucosal injection with wire tracking submucosally.  5. No manipulation or opacification of the pancreatic duct The procedure was aborted        Impression/Plan   Obstructive  jaundice Pancreatic mass CA 19-9 2,010 LFTs improving No leukocytosis S/p ERCP 03/14/2024 with failed biliary drainage and biliary obstruction likely secondary to cancer pancreatic head with mild compression of duodenum at bulb/D2 junction presumably due to tumor and bile duct stricture with proximal dilation S/p PERC biliary drain by IR 1/9 with 1,247mL output since 10am 1/12 per documentation Cytology still remains pending No leukocytosis and afebrile - If cytopathology positive, refer to oncology - If cytopathology negative, consider EUS with biopsy - Continue to trend LFTs -- Continue supportive care -- can discontinue Zosyn    MGUS with normocytic anemia Likely multifactorial in addition to CKD Iron  68, TIBC 213 Hgb stable No overt GI bleeding -- Continue daily CBC and transfuse as needed to maintain HGB > 7    Coronary artery disease s/p 3 vessel CABG 02/2018.  No angina.   Nonischemic cardiomyopathy. LV EF 50 - 55% per ECHO 05/2021.   AKI on CKD Nephrology following   ADDENDUM: Cytology was negative for malignancy. Plan for EUS with FNA Friday 1/16 with Dr. Rollin Pfeiffer M Zakaiya Lares  03/21/2024, 11:06 AM    "

## 2024-03-21 NOTE — Progress Notes (Signed)
 " PROGRESS NOTE STEEL KERNEY  FMW:[id number] DOB: [birth year] DOA: 03/14/2024 PCP: [name], MD  Brief Narrative/Hospital Course: Luis Chen is a 84 y.o. male with PMH of CKD stage III, CAD, MGUS, HLD who presented with lightheadedness and low blood pressure, and weight loss over the last few weeks. Blood work in the ED showed elevated LFTs, significantly elevated alkaline phosphatase and bilirubin. CT abdomen/pelvis showed concern for pancreatic mass , labs showed AKI.  Gastroenterology and nephrology IR has been consulted.   S/p ERCP but unable to pass guidewire, now s/p perc biliary drain by IR.   Subjective: Seen and examined  No new complaints  Daughter at bedside Open remains afebrile BP stable on room air Labs w/ Alk phos AST ALT downtrending, TB down 7.8> >> 1.9> [id number]> 1049> 879. Patient ambulated 500 feet yesterday with mobility tech  Assessment and plan:   Obstructive jaundice due to pancreatic head mass: MRCP-pancreatic head mass associated with severe dorsal pancreatic duct and biliary dilation-GI consulted underwent ERCP-unable to pass guidewire, now s/p perc biliary drain by IR. CA 19-9 levels elevated 2010, cytology pending.  Monitor LFTs, biliary drain output, continue flush and drain care as per IR. Awaiting for cytology report to decide further plan>If brushing cytology negativ ? EUS if he is a candidate for palliative oncologic care. Ok to stop Zosyn  per GI.Cont to trend lfts which is overall impropving Recent Labs  Lab 03/14/24 1730 03/16/24 0447 03/17/24 0433 03/18/24 0425 03/19/24 0414 03/19/24 0415 03/20/24 0355 03/21/24 0405  AST 222* 255* 260* 234* 103*  --  64* 49*  ALT 285* 236* 228* 227* 155*  --  117* 99*  ALKPHOS 1,876* 1,691* 1,545* 1,560* 1,175*  --  1,049* 879*  BILITOT 8.2* 7.4* 7.8* 4.9* 3.1*  --  2.7* 2.4*  PROT 6.7 5.7* 5.1* 5.7* 5.3*  --  5.3* 5.5*  ALBUMIN  3.3* 2.7* 2.6* 2.6* 2.6* 2.6* 2.5*  2.5* 2.6*  INR 1.0 1.1  --   --   --    --   --   --     AKI on CKD 3a Metabolic acidosis: B/l creat~1.3-1.5 in 2024 and October 2025.CT showed no hydro and bladder thickening possibly due to bladder outlet obstruction likely volume depletion and bladder outlet obstruction contributed. Continue Foley, monitor output.  Bicarb improved, nephrology following, off IV fluids. Recent Labs    06/15/23 1116 12/21/23 1307 03/14/24 1730 03/15/24 0947 03/16/24 0447 03/17/24 0433 03/18/24 0425 03/19/24 0415 03/20/24 0355  BUN 17 20 27* 25* 22 16 24* 23 21  CREATININE 1.31* 1.38* 2.57* 2.75* 3.20* 3.09* 3.11* 2.93* 2.65*  CO2 23 27 21* 18* 20* 22 29 29 29   K 4.4 4.4 4.5 4.3 4.0 3.7 4.0 3.6 3.4*    Normocytic anemia: Remains stable transfuse if less than 7 g suspect in the setting of #1 Recent Labs  Lab 03/17/24 0433 03/18/24 0425 03/19/24 0414 03/20/24 0355 03/21/24 0405  HGB 8.1* 8.7* 7.8* 7.4* 7.2*  HCT 21.8* 23.3* 20.8* 18.8* 19.5*    CAD: Antiplatelets on hold resume once okay postprocedure by GI or IR  Glaucoma: Continue eyedrops  Prediabetes with Hyperglycemia: A1c 6.2, continue diet control  Moderate malnutrition with Weight loss, low BMI Body mass index is 21.35 kg/m.: Augment diet as able.    Mobility: PT Orders: Active PT Follow up Rec: No Pt Follow Up1/10/2024 1354   DVT prophylaxis: heparin  injection 5,000 Units Start: 03/19/24 2200 SCDs Start: 03/15/24 1637 Code Status:  Code Status: Full Code Family Communication: plan of care discussed with patient and son at the bedside. Patient status is: Remains hospitalized because of severity of illness Level of care: Telemetry   Dispo: The patient is from: home            Anticipated disposition: TBD Objective: Vitals last 24 hrs: Vitals:   03/20/24 1333 03/20/24 2133 03/21/24 0432 03/21/24 0432  BP: 120/75 123/71  (!) 127/57  Pulse: 79 82  79  Resp: 17 20  18   Temp: 98.3 F (36.8 C) 98.3 F (36.8 C)  98.2 F (36.8 C)  TempSrc: Oral Oral  Oral   SpO2: 100% 97%  99%  Weight:   67.5 kg   Height:       Physical Examination: General exam: AAOX3 pleasant and not in distress HEENT:Oral mucosa moist, Ear/Nose WNL grossly Respiratory system: Bilaterally clear Cardiovascular system: S1 & S2 +, No JVD. Gastrointestinal system: Abdomen soft, tender,ND, BS+ biliary drain+ Nervous System: Alert, awake, moving all extremities,and following commands. Extremities: extremities warm, leg edema neg Skin: Warm, no rashes MSK: Normal muscle bulk,tone, power   Medications reviewed:  Scheduled Meds:  Chlorhexidine  Gluconate Cloth  6 each Topical Daily   diclofenac   100 mg Rectal Once   heparin  injection (subcutaneous)  5,000 Units Subcutaneous Q8H   latanoprost   1 drop Both Eyes Daily   lidocaine -EPINEPHrine   20 mL Intradermal Once   meperidine  (DEMEROL ) injection  25 mg Intravenous Once   senna  1 tablet Oral BID   sodium chloride  flush  5 mL Intracatheter Q8H  Continuous Infusions:  Diet: Diet Order             Diet Heart Room service appropriate? Yes; Fluid consistency: Thin  Diet effective now                   Data Reviewed: I have personally reviewed following labs and imaging studies ( see epic result tab) CBC: Recent Labs  Lab 03/14/24 1730 03/16/24 0447 03/17/24 0433 03/18/24 0425 03/19/24 0414 03/20/24 0355 03/21/24 0405  WBC 6.1   < > 6.5 5.3 11.3* 8.9 8.6  NEUTROABS 3.9  --   --   --   --   --   --   HGB 9.2*   < > 8.1* 8.7* 7.8* 7.4* 7.2*  HCT 26.0*   < > 21.8* 23.3* 20.8* 18.8* 19.5*  MCV 88.7   < > 84.5 84.1 82.9 84.7 85.2  PLT 258   < > 238 244 240 230 238   < > = values in this interval not displayed.   CMP: Recent Labs  Lab 03/16/24 0447 03/17/24 0433 03/18/24 0425 03/19/24 0415 03/20/24 0355  NA 139 141 139 138 140  K 4.0 3.7 4.0 3.6 3.4*  CL 108 109 101 100 103  CO2 20* 22 29 29 29   GLUCOSE 124* 89 224* 101* 98  BUN 22 16 24* 23 21  CREATININE 3.20* 3.09* 3.11* 2.93* 2.65*  CALCIUM  9.0  8.9 8.8* 8.6* 8.7*  PHOS  --   --   --  2.5 3.0   GFR: Estimated Creatinine Clearance: 19.5 mL/min (A) (by C-G formula based on SCr of 2.65 mg/dL (H)). Recent Labs  Lab 03/17/24 0433 03/18/24 0425 03/19/24 0414 03/19/24 0415 03/20/24 0355 03/21/24 0405  AST 260* 234* 103*  --  64* 49*  ALT 228* 227* 155*  --  117* 99*  ALKPHOS 1,545* 1,560* 1,175*  --  1,049* 879*  BILITOT 7.8* 4.9* 3.1*  --  2.7* 2.4*  PROT 5.1* 5.7* 5.3*  --  5.3* 5.5*  ALBUMIN  2.6* 2.6* 2.6* 2.6* 2.5*  2.5* 2.6*    Recent Labs  Lab 03/14/24 1850  LIPASE 62*   No results for input(s): AMMONIA in the last 168 hours. Coagulation Profile:  Recent Labs  Lab 03/14/24 1730 03/16/24 0447  INR 1.0 1.1   Unresulted Labs (From admission, onward)    None      Antimicrobials/Microbiology: Anti-infectives (From admission, onward)    Start     Dose/Rate Route Frequency Ordered Stop   03/17/24 1615  cefTRIAXone  (ROCEPHIN ) 2 g in sodium chloride  0.9 % 100 mL IVPB        2 g 200 mL/hr over 30 Minutes Intravenous  Once 03/17/24 1517 03/20/24 1256   03/15/24 2000  piperacillin -tazobactam (ZOSYN ) IVPB 2.25 g  Status:  Discontinued        2.25 g 100 mL/hr over 30 Minutes Intravenous Every 8 hours 03/15/24 1555 03/21/24 1129   03/15/24 0800  piperacillin -tazobactam (ZOSYN ) IVPB 3.375 g  Status:  Discontinued        3.375 g 12.5 mL/hr over 240 Minutes Intravenous Every 12 hours 03/15/24 0520 03/15/24 1555   03/15/24 0600  piperacillin -tazobactam (ZOSYN ) IVPB 2.25 g  Status:  Discontinued       Placed in Followed by Linked Group   2.25 g 100 mL/hr over 30 Minutes Intravenous Every 6 hours 03/14/24 2217 03/15/24 0519   03/14/24 2230  piperacillin -tazobactam (ZOSYN ) IVPB 3.375 g       Placed in Followed by Linked Group   3.375 g 100 mL/hr over 30 Minutes Intravenous  Once 03/14/24 2217 03/15/24 9394         Component Value Date/Time   SDES  03/17/2024 1758    BILE Performed at [facility], [address], [place], [place] [address]    SPECREQUEST  03/17/2024 1758    NONE Performed at [facility], [address], [place], [place] [address]    CULT  03/17/2024 1758    NO GROWTH 4 DAYS NO ANAEROBES ISOLATED; CULTURE IN PROGRESS FOR 5 DAYS Performed at [facility], [address] [place], [place] [address]    REPTSTATUS PENDING 03/17/2024 1758  Procedures: Procedures (LRB): ERCP, WITH INTERVENTION IF INDICATED (N/A)  Mennie LAMY, MD [facility] 03/21/2024, 12:36 PM   "

## 2024-03-21 NOTE — Progress Notes (Signed)
 Physical Therapy Treatment Patient Details Chen: [name] MRN: [id number] DOB: [birth year] Today's Date: 03/21/2024   History of Present Illness Luis Chen is an 84 y.o. male presents with concern for low blood pressure and lightheadedness. Abdominal MRI/MRCP with and without contrast identified a 3.3 x 2.3 x 2.1 cm pancreatic head mass associated with several dorsal pancreatic duct and biliary dilatation suspicious for pancreatic adenocarcinoma.  Mild gallbladder distention and mild gallbladder wall thickening also noted. S/P PERCUTANEOUS TRANSHEPATIC BILIARY TUBE PLACEMENT1/9/26  PMH: chronic kidney disease stage IIIb, coronary artery disease, MGUS, hyperlipidemia    PT Comments  Pt agreeable to session, daughter present throughout. Ambulates to stairwell and complete x11 steps with single HR; see additional details below. Good balance with stair task. Continues to demonstrate narrow BOS and intermittent scissoring while ambulating, VC to correct in order to improve his overall safety and balance. Returned to room and set pt up in recliner. PT emptied foley for 900cc; RN notified. PT will continue to follow pt while he is admitted to acute hospital; Luis follow up therapy needs.    If plan is discharge home, recommend the following:     Can travel by private vehicle        Equipment Recommendations  None recommended by PT    Recommendations for Other Services       Precautions / Restrictions Precautions Precautions: Fall Recall of Precautions/Restrictions: Intact Restrictions Weight Bearing Restrictions Per Provider Order: Luis     Mobility  Bed Mobility Overal bed mobility: Modified Independent             General bed mobility comments: Luis assist, using bedrail and elevated HOB as needed    Transfers Overall transfer level: Needs assistance Equipment used: None Transfers: Sit to/from Stand Sit to Stand: Supervision           General transfer comment: uses  BUE on seated surface to assist with power to stand and controlled lowering    Ambulation/Gait Ambulation/Gait assistance: Supervision, Contact guard assist Gait Distance (Feet): 500 Feet Assistive device: None Gait Pattern/deviations: Narrow base of support, Scissoring, Step-through pattern       General Gait Details: SPV-CGA due to narrow BOS and intermittent scissoring causing increased left lateral sway of gait path, VC to correct especially when turning cornerns. Luis LOB   Stairs Stairs: Yes Stairs assistance: Contact guard assist Stair Management: One rail Left Number of Stairs: 11 General stair comments: step through pattern ascending and step to pattern descending, good balance noted with task   Wheelchair Mobility     Tilt Bed    Modified Rankin (Stroke Patients Only)       Balance Overall balance assessment: Luis apparent balance deficits (not formally assessed)                                          Communication Communication Communication: Luis apparent difficulties  Cognition Arousal: Alert Behavior During Therapy: WFL for tasks assessed/performed   PT - Cognitive impairments: Luis apparent impairments                         Following commands: Intact      Cueing    Exercises      General Comments        Pertinent Vitals/Pain Pain Assessment Pain Assessment: Luis/denies pain    Home Living  Prior Function            PT Goals (current goals can now be found in the care plan section) Acute Rehab PT Goals Patient Stated Goal: return home, resume bowling PT Goal Formulation: With patient/family Time For Goal Achievement: 03/30/24 Potential to Achieve Goals: Good Progress towards PT goals: Progressing toward goals    Frequency    Min 2X/week      PT Plan      Co-evaluation              AM-PAC PT 6 Clicks Mobility   Outcome Measure  Help needed turning from  your back to your side while in a flat bed without using bedrails?: None Help needed moving from lying on your back to sitting on the side of a flat bed without using bedrails?: None Help needed moving to and from a bed to a chair (including a wheelchair)?: A Little Help needed standing up from a chair using your arms (e.g., wheelchair or bedside chair)?: None Help needed to walk in hospital room?: A Little Help needed climbing 3-5 steps with a railing? : A Little 6 Gumz Score: 21    End of Session Equipment Utilized During Treatment: Gait belt Activity Tolerance: Patient tolerated treatment well Patient left: in chair;with call bell/phone within reach;with family/visitor present;with chair alarm set Nurse Communication: Mobility status PT Visit Diagnosis: Unsteadiness on feet (R26.81);Other abnormalities of gait and mobility (R26.89)     Time: 8594-8571 PT Time Calculation (min) (ACUTE ONLY): 23 min  Charges:    $Gait Training: 23-37 mins                       Isaiah DEL. Medardo Hassing, PT, DPT   [employer] 03/21/2024, 2:44 PM

## 2024-03-22 DIAGNOSIS — I25118 Atherosclerotic heart disease of native coronary artery with other forms of angina pectoris: Secondary | ICD-10-CM | POA: Diagnosis not present

## 2024-03-22 DIAGNOSIS — D638 Anemia in other chronic diseases classified elsewhere: Secondary | ICD-10-CM

## 2024-03-22 DIAGNOSIS — K8689 Other specified diseases of pancreas: Secondary | ICD-10-CM | POA: Diagnosis not present

## 2024-03-22 DIAGNOSIS — R748 Abnormal levels of other serum enzymes: Secondary | ICD-10-CM | POA: Diagnosis not present

## 2024-03-22 DIAGNOSIS — R7401 Elevation of levels of liver transaminase levels: Secondary | ICD-10-CM | POA: Diagnosis not present

## 2024-03-22 DIAGNOSIS — K831 Obstruction of bile duct: Secondary | ICD-10-CM | POA: Diagnosis not present

## 2024-03-22 DIAGNOSIS — N1831 Chronic kidney disease, stage 3a: Secondary | ICD-10-CM | POA: Diagnosis not present

## 2024-03-22 LAB — COMPREHENSIVE METABOLIC PANEL WITH GFR
ALT: 88 U/L — ABNORMAL HIGH (ref 0–44)
AST: 42 U/L — ABNORMAL HIGH (ref 15–41)
Albumin: 3.1 g/dL — ABNORMAL LOW (ref 3.5–5.0)
Alkaline Phosphatase: 940 U/L — ABNORMAL HIGH (ref 38–126)
Anion gap: 9 (ref 5–15)
BUN: 18 mg/dL (ref 8–23)
CO2: 26 mmol/L (ref 22–32)
Calcium: 9.4 mg/dL (ref 8.9–10.3)
Chloride: 103 mmol/L (ref 98–111)
Creatinine, Ser: 2.03 mg/dL — ABNORMAL HIGH (ref 0.61–1.24)
GFR, Estimated: 32 mL/min — ABNORMAL LOW
Glucose, Bld: 99 mg/dL (ref 70–99)
Potassium: 3.9 mmol/L (ref 3.5–5.1)
Sodium: 138 mmol/L (ref 135–145)
Total Bilirubin: 2.9 mg/dL — ABNORMAL HIGH (ref 0.0–1.2)
Total Protein: 6.6 g/dL (ref 6.5–8.1)

## 2024-03-22 LAB — CBC WITH DIFFERENTIAL/PLATELET
Abs Immature Granulocytes: 0.02 K/uL (ref 0.00–0.07)
Basophils Absolute: 0 K/uL (ref 0.0–0.1)
Basophils Relative: 1 %
Eosinophils Absolute: 0.4 K/uL (ref 0.0–0.5)
Eosinophils Relative: 5 %
HCT: 22.8 % — ABNORMAL LOW (ref 39.0–52.0)
Hemoglobin: 7.9 g/dL — ABNORMAL LOW (ref 13.0–17.0)
Immature Granulocytes: 0 %
Lymphocytes Relative: 20 %
Lymphs Abs: 1.7 K/uL (ref 0.7–4.0)
MCH: 31.2 pg (ref 26.0–34.0)
MCHC: 34.6 g/dL (ref 30.0–36.0)
MCV: 90.1 fL (ref 80.0–100.0)
Monocytes Absolute: 0.8 K/uL (ref 0.1–1.0)
Monocytes Relative: 10 %
Neutro Abs: 5.4 K/uL (ref 1.7–7.7)
Neutrophils Relative %: 64 %
Platelets: 264 K/uL (ref 150–400)
RBC: 2.53 MIL/uL — ABNORMAL LOW (ref 4.22–5.81)
RDW: 17.5 % — ABNORMAL HIGH (ref 11.5–15.5)
WBC: 8.4 K/uL (ref 4.0–10.5)
nRBC: 0 % (ref 0.0–0.2)

## 2024-03-22 LAB — AEROBIC/ANAEROBIC CULTURE W GRAM STAIN (SURGICAL/DEEP WOUND)
Culture: NO GROWTH
Gram Stain: NONE SEEN

## 2024-03-22 LAB — MAGNESIUM: Magnesium: 1.7 mg/dL (ref 1.7–2.4)

## 2024-03-22 LAB — PHOSPHORUS: Phosphorus: 2.9 mg/dL (ref 2.5–4.6)

## 2024-03-22 NOTE — Progress Notes (Signed)
 " PROGRESS NOTE    Luis Chen  FMW:[id number] DOB: Feb 16, 1941 DOA: 03/14/2024 PCP: [name], MD    Chief Complaint  Patient presents with   Hypotension    Brief Narrative:  Luis Chen is a 84 y.o. male with PMH of CKD stage III, CAD, MGUS, HLD who presented with lightheadedness and low blood pressure, and weight loss over the last few weeks. Blood work in the ED showed elevated LFTs, significantly elevated alkaline phosphatase and bilirubin. CT abdomen/pelvis showed concern for pancreatic mass , labs showed AKI.  Gastroenterology and nephrology IR has been consulted.   S/p ERCP but unable to pass guidewire, now s/p perc biliary drain by IR.      Assessment & Plan:   Principal Problem:   Obstructive jaundice (HCC) Active Problems:   Anemia, chronic disease   Chronic kidney disease (CKD), stage III (moderate) (HCC)   MGUS (monoclonal gammopathy of unknown significance)   Coronary artery disease involving native coronary artery of native heart with other form of angina pectoris   Transaminitis   Pancreatic mass   Elevated bilirubin   Abnormal CT of the abdomen   Elevated liver enzymes  #1 obstructive jaundice secondary to pancreatic head mass -MRCP done with concerns for pancreatic head mass associated with severe dorsal pancreatic duct and biliary dilatation. - Patient seen in consultation by GI and underwent ERCP however unable to pass guidewire and IR consulted for percutaneous drain placement which was done 03/17/2024. - CA 19-9 noted to be elevated at 2010 - Cytology done with no malignant cells identified. - LFTs trending down. - Patient was on IV Zosyn  which was discontinued per GI recommendations. - Patient being followed by GI and patient for EUS and FNA to be done on Friday, 03/24/2024. - Per GI.  2.  AKI on CKD stage IIIa - Baseline creatinine noted at 1.3-1.5 in 2024 and October 2025. - CT done with no hydronephrosis and bladder thickening possible due  to bladder outlet obstruction. - AKI felt likely secondary to volume depletion and bladder outlet obstruction. - Foley catheter in place. - Patient was hydrated with IV fluids with renal function trending down creatinine down to 2.03. - Currently off IV fluids. - Nephrology following and feel patient will need urology outpatient follow-up or TOVA prior to discharge.  3.  Metabolic acidosis -Resolved.  4.  Normocytic anemia -Follow H&H. - Transfuse for hemoglobin < 7.  5.  CAD -Antiplatelets on hold and resume once cleared by GI and post procedures.  6.  Glaucoma -Continue eyedrops.    DVT prophylaxis: Heparin  Code Status: Full Family Communication: Updated patient.  No family at bedside. Disposition: Likely home when clinically improved and cleared by GI.  Status is: Inpatient Remains inpatient appropriate because: Severity of illness   Consultants:  Gastroenterology: Dr. Abran 03/16/2024 Nephrology: Dr. Vassie 03/16/2024 Interventional radiology: Dr.Mugweru 03/17/2018  Procedures:  CT abdomen and pelvis 03/14/2024 MRCP 03/15/2024 ERCP 03/17/2024 by Dr. Abran Percutaneous transhepatic biliary tube placement anterograde cholangiogram by IR: Dr.Mugweru 03/17/2024  Antimicrobials:  Anti-infectives (From admission, onward)    Start     Dose/Rate Route Frequency Ordered Stop   03/17/24 1615  cefTRIAXone  (ROCEPHIN ) 2 g in sodium chloride  0.9 % 100 mL IVPB        2 g 200 mL/hr over 30 Minutes Intravenous  Once 03/17/24 1517 03/20/24 1256   03/15/24 2000  piperacillin -tazobactam (ZOSYN ) IVPB 2.25 g  Status:  Discontinued        2.25  g 100 mL/hr over 30 Minutes Intravenous Every 8 hours 03/15/24 1555 03/21/24 1129   03/15/24 0800  piperacillin -tazobactam (ZOSYN ) IVPB 3.375 g  Status:  Discontinued        3.375 g 12.5 mL/hr over 240 Minutes Intravenous Every 12 hours 03/15/24 0520 03/15/24 1555   03/15/24 0600  piperacillin -tazobactam (ZOSYN ) IVPB 2.25 g  Status:  Discontinued        Placed in Followed by Linked Group   2.25 g 100 mL/hr over 30 Minutes Intravenous Every 6 hours 03/14/24 2217 03/15/24 0519   03/14/24 2230  piperacillin -tazobactam (ZOSYN ) IVPB 3.375 g       Placed in Followed by Linked Group   3.375 g 100 mL/hr over 30 Minutes Intravenous  Once 03/14/24 2217 03/15/24 9394         Subjective: Patient sitting up in recliner.  Denies any chest pain or shortness of breath.  No abdominal pain.  No lightheadedness or dizziness.  Overall feels well.  Objective: Vitals:   03/21/24 0432 03/21/24 1338 03/21/24 2243 03/22/24 0436  BP: (!) 127/57 (!) 146/75 (!) 145/77 122/69  Pulse: 79 79 90 77  Resp: 18 16 18 16   Temp: 98.2 F (36.8 C) 98.4 F (36.9 C) 98.3 F (36.8 C) 98.2 F (36.8 C)  TempSrc: Oral Oral Oral Oral  SpO2: 99% 100% 99% 98%  Weight:      Height:        Intake/Output Summary (Last 24 hours) at 03/22/2024 1346 Last data filed at 03/22/2024 1315 Gross per 24 hour  Intake 600 ml  Output 2575 ml  Net -1975 ml   Filed Weights   03/19/24 0420 03/20/24 0500 03/21/24 0432  Weight: 68.4 kg 67.1 kg 67.5 kg    Examination:  General exam: Appears calm and comfortable  Respiratory system: Clear to auscultation. Respiratory effort normal. Cardiovascular system: S1 & S2 heard, RRR. No JVD, murmurs, rubs, gallops or clicks. No pedal edema. Gastrointestinal system: Abdomen is nondistended, soft and nontender. No organomegaly or masses felt. Normal bowel sounds heard. Central nervous system: Alert and oriented. No focal neurological deficits. Extremities: Symmetric 5 x 5 power. Skin: No rashes, lesions or ulcers Psychiatry: Judgement and insight appear normal. Mood & affect appropriate.     Data Reviewed: I have personally reviewed following labs and imaging studies  CBC: Recent Labs  Lab 03/18/24 0425 03/19/24 0414 03/20/24 0355 03/21/24 0405 03/22/24 1303  WBC 5.3 11.3* 8.9 8.6 8.4  NEUTROABS  --   --   --   --  5.4  HGB  8.7* 7.8* 7.4* 7.2* 7.9*  HCT 23.3* 20.8* 18.8* 19.5* 22.8*  MCV 84.1 82.9 84.7 85.2 90.1  PLT 244 240 230 238 264    Basic Metabolic Panel: Recent Labs  Lab 03/17/24 0433 03/18/24 0425 03/19/24 0415 03/20/24 0355 03/22/24 1303  NA 141 139 138 140 138  K 3.7 4.0 3.6 3.4* 3.9  CL 109 101 100 103 103  CO2 22 29 29 29 26   GLUCOSE 89 224* 101* 98 99  BUN 16 24* 23 21 18   CREATININE 3.09* 3.11* 2.93* 2.65* 2.03*  CALCIUM  8.9 8.8* 8.6* 8.7* 9.4  MG  --   --   --   --  1.7  PHOS  --   --  2.5 3.0 2.9    GFR: Estimated Creatinine Clearance: 25.4 mL/min (A) (by C-G formula based on SCr of 2.03 mg/dL (H)).  Liver Function Tests: Recent Labs  Lab 03/18/24 0425 03/19/24 0414 03/19/24  9584 03/20/24 0355 03/21/24 0405 03/22/24 1303  AST 234* 103*  --  64* 49* 42*  ALT 227* 155*  --  117* 99* 88*  ALKPHOS 1,560* 1,175*  --  1,049* 879* 940*  BILITOT 4.9* 3.1*  --  2.7* 2.4* 2.9*  PROT 5.7* 5.3*  --  5.3* 5.5* 6.6  ALBUMIN  2.6* 2.6* 2.6* 2.5*  2.5* 2.6* 3.1*    CBG: No results for input(s): GLUCAP in the last 168 hours.   Recent Results (from the past 240 hours)  Culture, blood (routine x 2)     Status: None   Collection Time: 03/14/24  5:33 PM   Specimen: BLOOD  Result Value Ref Range Status   Specimen Description   Final    BLOOD LEFT ANTECUBITAL Performed at [facility], [address], [place], [place] [address]    Special Requests   Final    BOTTLES DRAWN AEROBIC AND ANAEROBIC Blood Culture adequate volume Performed at [facility], [address] [place], [place] [address]    Culture   Final    NO GROWTH 5 DAYS Performed at [facility], [address] [place], [place] [address]    Report Status 03/19/2024 FINAL  Final  Culture, blood (routine x 2)     Status: None   Collection Time: 03/14/24  5:50 PM   Specimen: BLOOD RIGHT ARM  Result Value Ref Range Status   Specimen Description   Final    BLOOD  RIGHT ARM Performed at [facility], [address], [place], [place] [address]    Special Requests   Final    BOTTLES DRAWN AEROBIC ONLY Blood Culture adequate volume Performed at [facility], [address], [place], [place] [address]    Culture   Final    NO GROWTH 5 DAYS Performed at [facility], [address], [place], [place] [address]    Report Status 03/19/2024 FINAL  Final  Aerobic/Anaerobic Culture w Gram Stain (surgical/deep wound)     Status: None   Collection Time: 03/17/24  5:58 PM   Specimen: BILE  Result Value Ref Range Status   Specimen Description   Final    BILE Performed at [facility], [address], [place], [place] [address]    Special Requests   Final    NONE Performed at [facility], [address] [place], [place] [address]    Gram Stain NO WBC SEEN NO ORGANISMS SEEN   Final   Culture   Final    No growth aerobically or anaerobically. Performed at [facility], [address], [place], [place] [address]    Report Status 03/22/2024 FINAL  Final         Radiology Studies: No results found.      Scheduled Meds:  Chlorhexidine  Gluconate Cloth  6 each Topical Daily   diclofenac   100 mg Rectal Once   heparin  injection (subcutaneous)  5,000 Units Subcutaneous Q8H   latanoprost   1 drop Both Eyes Daily   lidocaine -EPINEPHrine   20 mL Intradermal Once   meperidine  (DEMEROL ) injection  25 mg Intravenous Once   senna  1 tablet Oral BID   sodium chloride  flush  5 mL Intracatheter Q8H   Continuous Infusions:   LOS: 7 days    Time spent: 40 minutes    Toribio Hummer, MD [facility]   To contact the attending provider between 7A-7P or the covering provider during  after hours 7P-7A, please log into the web site [email or web address] and access using universal [place] password for that web site. If you do not have the password, please call the hospital  operator.  03/22/2024, 1:46 PM    "

## 2024-03-22 NOTE — Progress Notes (Signed)
 Mobility Specialist - Progress Note   03/22/24 1401  Mobility  Activity Ambulated with assistance  Level of Assistance Contact guard assist, steadying assist  Assistive Device None  Distance Ambulated (ft) 350 ft  Range of Motion/Exercises Active  Activity Response Tolerated fair  Mobility visit 1 Mobility  Mobility Specialist Start Time (ACUTE ONLY) 1350  Mobility Specialist Stop Time (ACUTE ONLY) 1401  Mobility Specialist Time Calculation (min) (ACUTE ONLY) 11 min   Pt was found on recliner chair and agreeable to mobilize. A little unsteady and had x2 cross-steps. At EOS returned to recliner chair with all needs met. Call bell in reach and RN in room.   Erminio Leos,  Mobility Specialist Can be reached via Secure Chat

## 2024-03-22 NOTE — Progress Notes (Signed)
 "   Progress Note  Primary GI: Unassigned, [facility] PCP, Dr. Abran consulted   LOS: 7 days   Chief Complaint: Obstructive jaundice   Subjective   No family in the room at the time of my evaluation.  Patient is doing well with no changes compared to yesterday.  LFTs continue to improve. having no pain, tolerating diet without difficulty.  Cytology was negative for malignancy so planning for EUS Friday 1/16 AM  S/p PERC biliary drain by IR 1/9 with 1,080mL drainage in the last 24hrs   Objective   Vital signs in last 24 hours: Temp:  [98.2 F (36.8 C)-98.4 F (36.9 C)] 98.2 F (36.8 C) (01/14 0436) Pulse Rate:  [77-90] 77 (01/14 0436) Resp:  [16-18] 16 (01/14 0436) BP: (122-146)/(69-77) 122/69 (01/14 0436) SpO2:  [98 %-100 %] 98 % (01/14 0436) Last BM Date : 03/21/24 (this morning per pt) Last BM recorded by nurses in past 5 days Stool Type: Type 5 (Soft blobs with clear-cut edges) (03/20/2024  1:45 PM)  General:   male in no acute distress  Heart:  Regular rate and rhythm; no murmurs Pulm: Clear anteriorly; no wheezing Abdomen: soft, nondistended, normal bowel sounds in all quadrants. Nontender without guarding. No organomegaly appreciated. Extremities:  No edema Neurologic:  Alert and  oriented x4;  No focal deficits.  Psych:  Cooperative. Normal mood and affect.  Intake/Output from previous day: 01/13 0701 - 01/14 0700 In: 485 [P.O.:480; I.V.:5] Out: 1800 [Urine:900; Drains:900] Intake/Output this shift: Total I/O In: 360 [P.O.:360] Out: 375 [Urine:200; Drains:175]  Studies/Results: No results found.  Lab Results: Recent Labs    03/20/24 0355 03/21/24 0405  WBC 8.9 8.6  HGB 7.4* 7.2*  HCT 18.8* 19.5*  PLT 230 238   BMET Recent Labs    03/20/24 0355  NA 140  K 3.4*  CL 103  CO2 29  GLUCOSE 98  BUN 21  CREATININE 2.65*  CALCIUM  8.7*   LFT Recent Labs    03/21/24 0405  PROT 5.5*  ALBUMIN  2.6*  AST 49*  ALT 99*  ALKPHOS 879*  BILITOT 2.4*  BILIDIR  1.9*  IBILI 0.5   PT/INR No results for input(s): LABPROT, INR in the last 72 hours.   Scheduled Meds:  Chlorhexidine  Gluconate Cloth  6 each Topical Daily   diclofenac   100 mg Rectal Once   heparin  injection (subcutaneous)  5,000 Units Subcutaneous Q8H   latanoprost   1 drop Both Eyes Daily   lidocaine -EPINEPHrine   20 mL Intradermal Once   meperidine  (DEMEROL ) injection  25 mg Intravenous Once   senna  1 tablet Oral BID   sodium chloride  flush  5 mL Intracatheter Q8H   Continuous Infusions:    Patient profile:   84 year old male admitted 03/15/2024 for weight loss, elevated LFTs, obstructive jaundice with associated pancreatic mass, biliary and pancreatic duct dilation concerning for pancreatic adenocarcinoma   CTAP showed fullness in the head of the pancreas measuring 2.5 cm in diameter with associated pancreatic and bile duct dilation   MRCP with 3.3 x 2.3 x 2.1 cm pancreatic head mass associated with several dorsal pancreatic duct and biliary dilation, mild gallbladder distention and mild gallbladder wall thickening   Initial bilirubin 8.2 1/6 --> now 2.7   ERCP 03/14/24 1. The side- viewing endoscope was passed blindly into the esophagus. There were no gross abnormalities of the gastric or duodenal mucosa.  2. There was mild compression of the duodenum at the bulb/ D2 junction, presumably due to tumor  3. There was partial opacification of the bile duct which appeared to have a 1. 5 to 2 cm stricture with proximal dilation  4. Despite numerous attempts, the hydrophilic guidewire would not find its way into the proximal bile duct. At one juncture there was submucosal injection with wire tracking submucosally.  5. No manipulation or opacification of the pancreatic duct The procedure was aborted     Impression/Plan:   Obstructive jaundice Pancreatic mass CA 19-9 2,010 LFTs improving No leukocytosis S/p ERCP 03/14/2024 with failed biliary drainage and biliary obstruction  likely secondary to cancer pancreatic head with mild compression of duodenum at bulb/D2 junction presumably due to tumor and bile duct stricture with proximal dilation S/p PERC biliary drain by IR 1/9 with 1,039mL output since 10am 1/13 per documentation Cytology from PTC negative No leukocytosis and afebrile - Continue to trend LFTs -- Continue supportive care -- plan for EUS Friday (1/16) AM with patient NPO midnight the night before   MGUS with normocytic anemia Likely multifactorial in addition to CKD Iron  68, TIBC 213 Hgb stable No overt GI bleeding -- Continue daily CBC and transfuse as needed to maintain HGB > 7    Coronary artery disease s/p 3 vessel CABG 02/2018.  No angina.   Nonischemic cardiomyopathy. LV EF 50 - 55% per ECHO 05/2021.   AKI on CKD Nephrology following     Luis Chen  03/22/2024, 9:19 AM    "

## 2024-03-23 DIAGNOSIS — N1831 Chronic kidney disease, stage 3a: Secondary | ICD-10-CM | POA: Diagnosis not present

## 2024-03-23 DIAGNOSIS — D472 Monoclonal gammopathy: Secondary | ICD-10-CM | POA: Diagnosis not present

## 2024-03-23 DIAGNOSIS — K831 Obstruction of bile duct: Secondary | ICD-10-CM | POA: Diagnosis not present

## 2024-03-23 DIAGNOSIS — R7401 Elevation of levels of liver transaminase levels: Secondary | ICD-10-CM | POA: Diagnosis not present

## 2024-03-23 DIAGNOSIS — R748 Abnormal levels of other serum enzymes: Secondary | ICD-10-CM | POA: Diagnosis not present

## 2024-03-23 DIAGNOSIS — K8689 Other specified diseases of pancreas: Secondary | ICD-10-CM | POA: Diagnosis not present

## 2024-03-23 DIAGNOSIS — I25118 Atherosclerotic heart disease of native coronary artery with other forms of angina pectoris: Secondary | ICD-10-CM | POA: Diagnosis not present

## 2024-03-23 DIAGNOSIS — D638 Anemia in other chronic diseases classified elsewhere: Secondary | ICD-10-CM | POA: Diagnosis not present

## 2024-03-23 LAB — CBC WITH DIFFERENTIAL/PLATELET
Abs Immature Granulocytes: 0.03 K/uL (ref 0.00–0.07)
Basophils Absolute: 0 K/uL (ref 0.0–0.1)
Basophils Relative: 0 %
Eosinophils Absolute: 0.3 K/uL (ref 0.0–0.5)
Eosinophils Relative: 3 %
HCT: 19.1 % — ABNORMAL LOW (ref 39.0–52.0)
Hemoglobin: 6.8 g/dL — CL (ref 13.0–17.0)
Immature Granulocytes: 0 %
Lymphocytes Relative: 21 %
Lymphs Abs: 2 K/uL (ref 0.7–4.0)
MCH: 31.6 pg (ref 26.0–34.0)
MCHC: 35.6 g/dL (ref 30.0–36.0)
MCV: 88.8 fL (ref 80.0–100.0)
Monocytes Absolute: 1.1 K/uL — ABNORMAL HIGH (ref 0.1–1.0)
Monocytes Relative: 11 %
Neutro Abs: 6.1 K/uL (ref 1.7–7.7)
Neutrophils Relative %: 65 %
Platelets: 252 K/uL (ref 150–400)
RBC: 2.15 MIL/uL — ABNORMAL LOW (ref 4.22–5.81)
RDW: 16.9 % — ABNORMAL HIGH (ref 11.5–15.5)
WBC: 9.5 K/uL (ref 4.0–10.5)
nRBC: 0 % (ref 0.0–0.2)

## 2024-03-23 LAB — COMPREHENSIVE METABOLIC PANEL WITH GFR
ALT: 70 U/L — ABNORMAL HIGH (ref 0–44)
AST: 35 U/L (ref 15–41)
Albumin: 2.9 g/dL — ABNORMAL LOW (ref 3.5–5.0)
Alkaline Phosphatase: 796 U/L — ABNORMAL HIGH (ref 38–126)
Anion gap: 6 (ref 5–15)
BUN: 16 mg/dL (ref 8–23)
CO2: 25 mmol/L (ref 22–32)
Calcium: 9.1 mg/dL (ref 8.9–10.3)
Chloride: 105 mmol/L (ref 98–111)
Creatinine, Ser: 1.94 mg/dL — ABNORMAL HIGH (ref 0.61–1.24)
GFR, Estimated: 33 mL/min — ABNORMAL LOW
Glucose, Bld: 105 mg/dL — ABNORMAL HIGH (ref 70–99)
Potassium: 3.7 mmol/L (ref 3.5–5.1)
Sodium: 137 mmol/L (ref 135–145)
Total Bilirubin: 2.9 mg/dL — ABNORMAL HIGH (ref 0.0–1.2)
Total Protein: 6 g/dL — ABNORMAL LOW (ref 6.5–8.1)

## 2024-03-23 LAB — PREPARE RBC (CROSSMATCH)

## 2024-03-23 LAB — HEMOGLOBIN AND HEMATOCRIT, BLOOD
HCT: 26.5 % — ABNORMAL LOW (ref 39.0–52.0)
Hemoglobin: 9.5 g/dL — ABNORMAL LOW (ref 13.0–17.0)

## 2024-03-23 MED ORDER — ACETAMINOPHEN 325 MG PO TABS
650.0000 mg | ORAL_TABLET | Freq: Once | ORAL | Status: AC
  Administered 2024-03-23: 650 mg via ORAL
  Filled 2024-03-23: qty 2

## 2024-03-23 MED ORDER — SODIUM CHLORIDE 0.9% IV SOLUTION: Freq: Once | INTRAVENOUS | Status: AC

## 2024-03-23 MED ORDER — SODIUM CHLORIDE 0.9% IV SOLUTION: Freq: Once | INTRAVENOUS | Status: DC

## 2024-03-23 NOTE — Progress Notes (Signed)
 "    Luis Chen Gastroenterology Progress Note  CC:  Obstructive jaundice   Subjective: Hemoglobin down today, but has had no sign of bleeding.  Says that he feels great.  No abdominal pain.  S/p PERC biliary drain by IR 1/9 with 1,275 mL drainage in the last 24 hrs   Objective:  Vital signs in last 24 hours: Temp:  [98.5 F (36.9 C)-99.5 F (37.5 C)] 99.5 F (37.5 C) (01/15 0517) Pulse Rate:  [75-89] 88 (01/15 0517) Resp:  [16-20] 16 (01/15 0517) BP: (121-135)/(63-69) 125/63 (01/15 0517) SpO2:  [98 %-100 %] 98 % (01/15 0517) Weight:  [58.7 kg] 58.7 kg (01/15 0517) Last BM Date : 03/22/24 General:  Alert, Well-developed, in NAD Heart:  Regular rate and rhythm; no murmurs Pulm:  CTAB.  No W/R/R. Abdomen:  Soft, non-distended.  BS present.  Non-tender.  Perc drain noted on the right, Extremities:  Without edema. Neurologic:  Alert and  oriented x4;  grossly normal neurologically. Psych:  Alert and cooperative. Normal mood and affect.  Intake/Output from previous day: 01/14 0701 - 01/15 0700 In: 718 [P.O.:718] Out: 2375 [Urine:1100; Drains:1275]  Lab Results: Recent Labs    03/21/24 0405 03/22/24 1303 03/23/24 0422  WBC 8.6 8.4 9.5  HGB 7.2* 7.9* 6.8*  HCT 19.5* 22.8* 19.1*  PLT 238 264 252   BMET Recent Labs    03/22/24 1303 03/23/24 0422  NA 138 137  K 3.9 3.7  CL 103 105  CO2 26 25  GLUCOSE 99 105*  BUN 18 16  CREATININE 2.03* 1.94*  CALCIUM  9.4 9.1   LFT Recent Labs    03/21/24 0405 03/22/24 1303 03/23/24 0422  PROT 5.5*   < > 6.0*  ALBUMIN  2.6*   < > 2.9*  AST 49*   < > 35  ALT 99*   < > 70*  ALKPHOS 879*   < > 796*  BILITOT 2.4*   < > 2.9*  BILIDIR 1.9*  --   --   IBILI 0.5  --   --    < > = values in this interval not displayed.   Assessment / Plan: 84 year old male admitted 03/15/2024 for weight loss, elevated LFTs, obstructive jaundice with associated pancreatic mass, biliary and pancreatic duct dilation concerning for pancreatic  adenocarcinoma   CTAP showed fullness in the head of the pancreas measuring 2.5 cm in diameter with associated pancreatic and bile duct dilation   MRCP with 3.3 x 2.3 x 2.1 cm pancreatic head mass associated with several dorsal pancreatic duct and biliary dilation, mild gallbladder distention and mild gallbladder wall thickening   Initial bilirubin 8.2 1/6 --> now 2.7   ERCP 03/14/24 1. The side- viewing endoscope was passed blindly into the esophagus. There were no gross abnormalities of the gastric or duodenal mucosa.  2. There was mild compression of the duodenum at the bulb/ D2 junction, presumably due to tumor  3. There was partial opacification of the bile duct which appeared to have a 1. 5 to 2 cm stricture with proximal dilation  4. Despite numerous attempts, the hydrophilic guidewire would not find its way into the proximal bile duct. At one juncture there was submucosal injection with wire tracking submucosally.  5. No manipulation or opacification of the pancreatic duct The procedure was aborted   Obstructive jaundice Pancreatic mass CA 19-9 2,010 LFTs overall improving but total bili the same at 2.9 No leukocytosis S/p ERCP 03/14/2024 with failed biliary drainage and biliary obstruction likely secondary  to cancer pancreatic head with mild compression of duodenum at bulb/D2 junction presumably due to tumor and bile duct stricture with proximal dilation S/p PERC biliary drain by IR 1/9 with 1,064mL output since 10am 1/13 per documentation Cytology from PTC negative No leukocytosis and afebrile - Continue to trend LFTs -- Continue supportive care -- plan for EUS Friday (1/16) AM with Dr. Rollin with patient NPO midnight the night before   MGUS with normocytic anemia Likely multifactorial in addition to CKD Iron  68, TIBC 213 Hgb down to 6.8 grams today from 7.9 grams yesterday.  Going to receive a unit of PRBCs No overt GI bleeding. -- Continue daily CBC and transfuse as needed to  maintain HGB > 7    Coronary artery disease s/p 3 vessel CABG 02/2018.  No angina.   Nonischemic cardiomyopathy. LV EF 50 - 55% per ECHO 05/2021.   AKI on CKD Nephrology following     LOS: 8 days   Luis Chen. Luis Chen  03/23/2024, 9:26 AM    "

## 2024-03-23 NOTE — Progress Notes (Signed)
 " PROGRESS NOTE    Luis Chen  FMW:[id number] DOB: [birth year] DOA: 03/14/2024 PCP: [name], MD    Chief Complaint  Patient presents with   Hypotension    Brief Narrative:  Luis Chen is a 84 y.o. male with PMH of CKD stage III, CAD, MGUS, HLD who presented with lightheadedness and low blood pressure, and weight loss over the last few weeks. Blood work in the ED showed elevated LFTs, significantly elevated alkaline phosphatase and bilirubin. CT abdomen/pelvis showed concern for pancreatic mass , labs showed AKI.  Gastroenterology and nephrology IR has been consulted.   S/p ERCP but unable to pass guidewire, now s/p perc biliary drain by IR.      Assessment & Plan:   Principal Problem:   Obstructive jaundice (HCC) Active Problems:   Anemia, chronic disease   Chronic kidney disease (CKD), stage III (moderate) (HCC)   MGUS (monoclonal gammopathy of unknown significance)   Coronary artery disease involving native coronary artery of native heart with other form of angina pectoris   Transaminitis   Pancreatic mass   Elevated bilirubin   Abnormal CT of the abdomen   Elevated liver enzymes  #1 obstructive jaundice secondary to pancreatic head mass -MRCP done with concerns for pancreatic head mass associated with severe dorsal pancreatic duct and biliary dilatation. - Patient seen in consultation by GI and underwent ERCP however unable to pass guidewire and IR consulted for percutaneous drain placement which was done 03/17/2024. -Percutaneous drain with output of 1.275 L over the past 24 hours. - CA 19-9 noted to be elevated at 2010 - Cytology done with no malignant cells identified. - LFTs trending down. - Patient was on IV Zosyn  which was discontinued per GI recommendations. - Patient being followed by GI and patient for EUS and FNA to be done tomorrow, Friday, 03/24/2024. -Patient to be n.p.o. except meds after midnight. - Per GI.  2.  AKI on CKD stage IIIa -  Baseline creatinine noted at 1.3-1.5 in 2024 and October 2025. - CT done with no hydronephrosis and bladder thickening possible due to bladder outlet obstruction. - AKI felt likely secondary to volume depletion and bladder outlet obstruction. - Foley catheter in place. -Urine output of 1.1 L over the past 24 hours. - Patient was hydrated with IV fluids with renal function trending down creatinine down to 1.94 today. - Currently off IV fluids. -Patient to receive transfusion of PRBCs today. - Nephrology following and feel patient will need urology outpatient follow-up or TOV prior to discharge.  3.  Metabolic acidosis -Resolved.  4.  MGUS with normocytic anemia - Patient with no overt bleeding. - Hemoglobin down to 6.8 this morning. - Transfuse 2 units PRBCs.  -GI following. - Transfuse for hemoglobin < 7. - Patient for EUS tomorrow.  5.  CAD -Antiplatelets on hold and resume once cleared by GI and post procedures.  6.  Glaucoma - Continue eyedrops.     DVT prophylaxis: Heparin  Code Status: Full Family Communication: Updated patient.  No family at bedside. Disposition: Likely home when clinically improved and cleared by GI.  Status is: Inpatient Remains inpatient appropriate because: Severity of illness   Consultants:  Gastroenterology: Dr. Abran 03/16/2024 Nephrology: Dr. Vassie 03/16/2024 Interventional radiology: Dr.Mugweru 03/17/2018  Procedures:  CT abdomen and pelvis 03/14/2024 MRCP 03/15/2024 ERCP 03/17/2024 by Dr. Abran Percutaneous transhepatic biliary tube placement anterograde cholangiogram by IR: Dr.Mugweru 03/17/2024 Transfusion 2 units PRBCs pending 03/23/2024  Antimicrobials:  Anti-infectives (From admission, onward)  Start     Dose/Rate Route Frequency Ordered Stop   03/17/24 1615  cefTRIAXone  (ROCEPHIN ) 2 g in sodium chloride  0.9 % 100 mL IVPB        2 g 200 mL/hr over 30 Minutes Intravenous  Once 03/17/24 1517 03/20/24 1256   03/15/24 2000   piperacillin -tazobactam (ZOSYN ) IVPB 2.25 g  Status:  Discontinued        2.25 g 100 mL/hr over 30 Minutes Intravenous Every 8 hours 03/15/24 1555 03/21/24 1129   03/15/24 0800  piperacillin -tazobactam (ZOSYN ) IVPB 3.375 g  Status:  Discontinued        3.375 g 12.5 mL/hr over 240 Minutes Intravenous Every 12 hours 03/15/24 0520 03/15/24 1555   03/15/24 0600  piperacillin -tazobactam (ZOSYN ) IVPB 2.25 g  Status:  Discontinued       Placed in Followed by Linked Group   2.25 g 100 mL/hr over 30 Minutes Intravenous Every 6 hours 03/14/24 2217 03/15/24 0519   03/14/24 2230  piperacillin -tazobactam (ZOSYN ) IVPB 3.375 g       Placed in Followed by Linked Group   3.375 g 100 mL/hr over 30 Minutes Intravenous  Once 03/14/24 2217 03/15/24 [id number]         Subjective: Patient lying in bed.  States he only slept from 11 PM to 4 AM.  Patient noted to have told RN some complaints of fatigue this morning.  Patient denies any chest pain or shortness of breath.  Denies any lightheadedness or dizziness.  Patient denies any overt bleeding.  Objective: Vitals:   03/22/24 1414 03/22/24 2205 03/23/24 0517 03/23/24 1109  BP: 121/69 135/63 125/63 (!) 130/54  Pulse: 89 75 88 81  Resp: 20 20 16    Temp: 98.5 F (36.9 C) 98.9 F (37.2 C) 99.5 F (37.5 C) 98.8 F (37.1 C)  TempSrc: Oral Oral Oral Oral  SpO2: 100% 99% 98% 98%  Weight:   58.7 kg   Height:        Intake/Output Summary (Last 24 hours) at 03/23/2024 1131 Last data filed at 03/23/2024 0249 Gross per 24 hour  Intake 358 ml  Output 2000 ml  Net -1642 ml   Filed Weights   03/20/24 0500 03/21/24 0432 03/23/24 0517  Weight: 67.1 kg 67.5 kg 58.7 kg    Examination:  General exam: NAD. Respiratory system: Lungs clear to auscultation bilaterally.  No wheezes, no crackles, no rhonchi.  Fair air movement.  Speaking in full sentences.   Cardiovascular system: RRR no murmurs rubs or gallops.  No JVD.  No lower extremity edema.    Gastrointestinal system: Abdomen is soft, nontender, nondistended, positive bowel sounds.  No rebound.  No guarding.  Percutaneous drain in place on the right. Central nervous system: Alert and oriented.  Moving extremities spontaneously.  No focal neurological deficits. Extremities: Symmetric 5 x 5 power. Skin: No rashes, lesions or ulcers Psychiatry: Judgement and insight appear normal. Mood & affect appropriate.     Data Reviewed: I have personally reviewed following labs and imaging studies  CBC: Recent Labs  Lab 03/19/24 0414 03/20/24 0355 03/21/24 0405 03/22/24 1303 03/23/24 0422  WBC 11.3* 8.9 8.6 8.4 9.5  NEUTROABS  --   --   --  5.4 6.1  HGB 7.8* 7.4* 7.2* 7.9* 6.8*  HCT 20.8* 18.8* 19.5* 22.8* 19.1*  MCV 82.9 84.7 85.2 90.1 88.8  PLT 240 230 238 264 252    Basic Metabolic Panel: Recent Labs  Lab 03/18/24 0425 03/19/24 0415 03/20/24 0355 03/22/24 1303 03/23/24  0422  NA 139 138 140 138 137  K 4.0 3.6 3.4* 3.9 3.7  CL 101 100 103 103 105  CO2 29 29 29 26 25   GLUCOSE 224* 101* 98 99 105*  BUN 24* 23 21 18 16   CREATININE 3.11* 2.93* 2.65* 2.03* 1.94*  CALCIUM  8.8* 8.6* 8.7* 9.4 9.1  MG  --   --   --  1.7  --   PHOS  --  2.5 3.0 2.9  --     GFR: Estimated Creatinine Clearance: 23.1 mL/min (A) (by C-G formula based on SCr of 1.94 mg/dL (H)).  Liver Function Tests: Recent Labs  Lab 03/19/24 0414 03/19/24 0415 03/20/24 0355 03/21/24 0405 03/22/24 1303 03/23/24 0422  AST 103*  --  64* 49* 42* 35  ALT 155*  --  117* 99* 88* 70*  ALKPHOS 1,175*  --  1,049* 879* 940* 796*  BILITOT 3.1*  --  2.7* 2.4* 2.9* 2.9*  PROT 5.3*  --  5.3* 5.5* 6.6 6.0*  ALBUMIN  2.6* 2.6* 2.5*  2.5* 2.6* 3.1* 2.9*    CBG: No results for input(s): GLUCAP in the last 168 hours.   Recent Results (from the past 240 hours)  Culture, blood (routine x 2)     Status: None   Collection Time: 03/14/24  5:33 PM   Specimen: BLOOD  Result Value Ref Range Status   Specimen  Description   Final    BLOOD LEFT ANTECUBITAL Performed at [facility], [address], [place], [place] [address]    Special Requests   Final    BOTTLES DRAWN AEROBIC AND ANAEROBIC Blood Culture adequate volume Performed at [facility], [address] [place], [place] [address]    Culture   Final    NO GROWTH 5 DAYS Performed at [facility], [address], [place], [place] [address]    Report Status 03/19/2024 FINAL  Final  Culture, blood (routine x 2)     Status: None   Collection Time: 03/14/24  5:50 PM   Specimen: BLOOD RIGHT ARM  Result Value Ref Range Status   Specimen Description   Final    BLOOD RIGHT ARM Performed at [facility], [address], [place], [place] [address]    Special Requests   Final    BOTTLES DRAWN AEROBIC ONLY Blood Culture adequate volume Performed at [facility], [address] [place], [place] [address]    Culture   Final    NO GROWTH 5 DAYS Performed at [facility], [address], [place], [place] [address]    Report Status 03/19/2024 FINAL  Final  Aerobic/Anaerobic Culture w Gram Stain (surgical/deep wound)     Status: None   Collection Time: 03/17/24  5:58 PM   Specimen: BILE  Result Value Ref Range Status   Specimen Description   Final    BILE Performed at [facility], [address] [place], [place] [address]    Special Requests   Final    NONE Performed at [facility], [address] [place], [place] [address]    Gram Stain NO WBC SEEN NO ORGANISMS SEEN   Final   Culture   Final    No growth aerobically or anaerobically. Performed at [facility], [address], [place], [place] [address]    Report Status 03/22/2024 FINAL  Final         Radiology Studies: No results found.  Scheduled Meds:  sodium chloride    Intravenous Once   Chlorhexidine  Gluconate Cloth  6 each Topical Daily    diclofenac   100 mg Rectal Once   heparin  injection (subcutaneous)  5,000 Units Subcutaneous Q8H   latanoprost   1 drop Both Eyes Daily   lidocaine -EPINEPHrine   20 mL Intradermal Once   meperidine  (DEMEROL ) injection  25 mg Intravenous Once   senna  1 tablet Oral BID   sodium chloride  flush  5 mL Intracatheter Q8H   Continuous Infusions:   LOS: 8 days    Time spent: 40 minutes    Toribio Hummer, MD [facility]   To contact the attending provider between 7A-7P or the covering provider during after hours 7P-7A, please log into the web site [email or web address] and access using universal [place] password for that web site. If you do not have the password, please call the hospital operator.  03/23/2024, 11:31 AM    "

## 2024-03-23 NOTE — Progress Notes (Signed)
 Physical Therapy Treatment Patient Details Name: Luis Chen MRN: [id number] DOB: [birth year] Today's Date: 03/23/2024   History of Present Illness Luis Chen is an 84 y.o. male presents with concern for low blood pressure and lightheadedness. Abdominal MRI/MRCP with and without contrast identified a 3.3 x 2.3 x 2.1 cm pancreatic head mass associated with several dorsal pancreatic duct and biliary dilatation suspicious for pancreatic adenocarcinoma.  Mild gallbladder distention and mild gallbladder wall thickening also noted. S/P PERCUTANEOUS TRANSHEPATIC BILIARY TUBE PLACEMENT1/9/26  PMH: chronic kidney disease stage IIIb, coronary artery disease, MGUS, hyperlipidemia    PT Comments  Pt pleasant and eager to be OOB.  Pt ambulated in hallway and then left OOB in recliner.  Pt did perform a couple LE exercises in recliner prior to repositioning to comfort.  Pt reports he takes a virtual exercise class through the [employer] 3x/week.  Pt anticipates procedure tomorrow.     If plan is discharge home, recommend the following:     Can travel by private vehicle        Equipment Recommendations  None recommended by PT    Recommendations for Other Services       Precautions / Restrictions Precautions Precautions: Fall Recall of Precautions/Restrictions: Intact Restrictions Weight Bearing Restrictions Per Provider Order: No     Mobility  Bed Mobility Overal bed mobility: Modified Independent             General bed mobility comments: no assist, using bedrail and elevated HOB as needed    Transfers Overall transfer level: Needs assistance Equipment used: Rolling walker (2 wheels) Transfers: Sit to/from Stand Sit to Stand: Supervision           General transfer comment: cues for hand placement    Ambulation/Gait Ambulation/Gait assistance: Contact guard assist, Supervision Gait Distance (Feet): 400 Feet Assistive device: Rolling walker (2 wheels) Gait  Pattern/deviations: Narrow base of support, Step-through pattern, Decreased stride length       General Gait Details: steady with RW which was utilized for support/safety (pt receiving 2nd unit PRBCs), pt denies symptoms   Stairs             Wheelchair Mobility     Tilt Bed    Modified Rankin (Stroke Patients Only)       Balance                                            Communication Communication Communication: No apparent difficulties  Cognition Arousal: Alert Behavior During Therapy: WFL for tasks assessed/performed   PT - Cognitive impairments: No apparent impairments                         Following commands: Intact      Cueing    Exercises      General Comments        Pertinent Vitals/Pain Pain Assessment Pain Assessment: No/denies pain    Home Living                          Prior Function            PT Goals (current goals can now be found in the care plan section) Progress towards PT goals: Progressing toward goals    Frequency    Min 2X/week  PT Plan      Co-evaluation              AM-PAC PT 6 Clicks Mobility   Outcome Measure  Help needed turning from your back to your side while in a flat bed without using bedrails?: None Help needed moving from lying on your back to sitting on the side of a flat bed without using bedrails?: None Help needed moving to and from a bed to a chair (including a wheelchair)?: None Help needed standing up from a chair using your arms (e.g., wheelchair or bedside chair)?: None Help needed to walk in hospital room?: A Little Help needed climbing 3-5 steps with a railing? : A Little 6 Travaglini Score: 22    End of Session Equipment Utilized During Treatment: Gait belt Activity Tolerance: Patient tolerated treatment well Patient left: in chair;with call bell/phone within reach;with chair alarm set;with nursing/sitter in room   PT Visit  Diagnosis: Other abnormalities of gait and mobility (R26.89)     Time: 1339-1400 PT Time Calculation (min) (ACUTE ONLY): 21 min  Charges:    $Gait Training: 8-22 mins PT General Charges $$ ACUTE PT VISIT: 1 Visit                    Tari KLEIN, DPT Physical Therapist Acute Rehabilitation Services Office: [phone]    Tari CROME [place] 03/23/2024, 2:08 PM

## 2024-03-24 ENCOUNTER — Inpatient Hospital Stay (HOSPITAL_COMMUNITY): Payer: Self-pay | Admitting: Registered Nurse

## 2024-03-24 ENCOUNTER — Encounter (HOSPITAL_COMMUNITY): Payer: Self-pay | Admitting: Registered Nurse

## 2024-03-24 ENCOUNTER — Encounter (HOSPITAL_COMMUNITY)
Admission: EM | Disposition: A | Discharge status: Home Health | Payer: Self-pay | Source: Home / Self Care | Attending: Internal Medicine

## 2024-03-24 DIAGNOSIS — K449 Diaphragmatic hernia without obstruction or gangrene: Secondary | ICD-10-CM

## 2024-03-24 DIAGNOSIS — I2510 Atherosclerotic heart disease of native coronary artery without angina pectoris: Secondary | ICD-10-CM | POA: Diagnosis not present

## 2024-03-24 DIAGNOSIS — N189 Chronic kidney disease, unspecified: Secondary | ICD-10-CM | POA: Diagnosis not present

## 2024-03-24 DIAGNOSIS — R748 Abnormal levels of other serum enzymes: Secondary | ICD-10-CM | POA: Diagnosis not present

## 2024-03-24 DIAGNOSIS — I129 Hypertensive chronic kidney disease with stage 1 through stage 4 chronic kidney disease, or unspecified chronic kidney disease: Secondary | ICD-10-CM | POA: Diagnosis not present

## 2024-03-24 DIAGNOSIS — D638 Anemia in other chronic diseases classified elsewhere: Secondary | ICD-10-CM | POA: Diagnosis not present

## 2024-03-24 DIAGNOSIS — K8689 Other specified diseases of pancreas: Secondary | ICD-10-CM | POA: Diagnosis not present

## 2024-03-24 DIAGNOSIS — N1831 Chronic kidney disease, stage 3a: Secondary | ICD-10-CM | POA: Diagnosis not present

## 2024-03-24 DIAGNOSIS — I25118 Atherosclerotic heart disease of native coronary artery with other forms of angina pectoris: Secondary | ICD-10-CM | POA: Diagnosis not present

## 2024-03-24 DIAGNOSIS — D472 Monoclonal gammopathy: Secondary | ICD-10-CM | POA: Diagnosis not present

## 2024-03-24 DIAGNOSIS — R7401 Elevation of levels of liver transaminase levels: Secondary | ICD-10-CM | POA: Diagnosis not present

## 2024-03-24 DIAGNOSIS — K831 Obstruction of bile duct: Secondary | ICD-10-CM | POA: Diagnosis not present

## 2024-03-24 HISTORY — PX: ESOPHAGOGASTRODUODENOSCOPY: SHX5428

## 2024-03-24 HISTORY — PX: EUS: SHX5427

## 2024-03-24 HISTORY — PX: FINE NEEDLE ASPIRATION: SHX6590

## 2024-03-24 LAB — CBC WITH DIFFERENTIAL/PLATELET
Abs Immature Granulocytes: 0.03 K/uL (ref 0.00–0.07)
Basophils Absolute: 0 K/uL (ref 0.0–0.1)
Basophils Relative: 0 %
Eosinophils Absolute: 0.2 K/uL (ref 0.0–0.5)
Eosinophils Relative: 2 %
HCT: 27.2 % — ABNORMAL LOW (ref 39.0–52.0)
Hemoglobin: 9.6 g/dL — ABNORMAL LOW (ref 13.0–17.0)
Immature Granulocytes: 0 %
Lymphocytes Relative: 11 %
Lymphs Abs: 1 K/uL (ref 0.7–4.0)
MCH: 31.4 pg (ref 26.0–34.0)
MCHC: 35.3 g/dL (ref 30.0–36.0)
MCV: 88.9 fL (ref 80.0–100.0)
Monocytes Absolute: 1 K/uL (ref 0.1–1.0)
Monocytes Relative: 11 %
Neutro Abs: 6.7 K/uL (ref 1.7–7.7)
Neutrophils Relative %: 76 %
Platelets: 241 K/uL (ref 150–400)
RBC: 3.06 MIL/uL — ABNORMAL LOW (ref 4.22–5.81)
RDW: 17.8 % — ABNORMAL HIGH (ref 11.5–15.5)
WBC: 8.8 K/uL (ref 4.0–10.5)
nRBC: 0 % (ref 0.0–0.2)

## 2024-03-24 LAB — URINALYSIS, COMPLETE (UACMP) WITH MICROSCOPIC
Bilirubin Urine: NEGATIVE
Glucose, UA: 50 mg/dL — AB
Ketones, ur: NEGATIVE mg/dL
Leukocytes,Ua: NEGATIVE
Nitrite: NEGATIVE
Protein, ur: 100 mg/dL — AB
Specific Gravity, Urine: 1.014 (ref 1.005–1.030)
pH: 5 (ref 5.0–8.0)

## 2024-03-24 LAB — TYPE AND SCREEN
ABO/RH(D): A POS
Antibody Screen: NEGATIVE
Unit division: 0
Unit division: 0

## 2024-03-24 LAB — CBC
HCT: 25.1 % — ABNORMAL LOW (ref 39.0–52.0)
Hemoglobin: 8.9 g/dL — ABNORMAL LOW (ref 13.0–17.0)
MCH: 31 pg (ref 26.0–34.0)
MCHC: 35.5 g/dL (ref 30.0–36.0)
MCV: 87.5 fL (ref 80.0–100.0)
Platelets: 241 K/uL (ref 150–400)
RBC: 2.87 MIL/uL — ABNORMAL LOW (ref 4.22–5.81)
RDW: 17.6 % — ABNORMAL HIGH (ref 11.5–15.5)
WBC: 8.7 K/uL (ref 4.0–10.5)
nRBC: 0 % (ref 0.0–0.2)

## 2024-03-24 LAB — COMPREHENSIVE METABOLIC PANEL WITH GFR
ALT: 60 U/L — ABNORMAL HIGH (ref 0–44)
ALT: 58 U/L — ABNORMAL HIGH (ref 0–44)
AST: 38 U/L (ref 15–41)
AST: 40 U/L (ref 15–41)
Albumin: 2.8 g/dL — ABNORMAL LOW (ref 3.5–5.0)
Albumin: 3 g/dL — ABNORMAL LOW (ref 3.5–5.0)
Alkaline Phosphatase: 715 U/L — ABNORMAL HIGH (ref 38–126)
Alkaline Phosphatase: 731 U/L — ABNORMAL HIGH (ref 38–126)
Anion gap: 10 (ref 5–15)
Anion gap: 10 (ref 5–15)
BUN: 16 mg/dL (ref 8–23)
BUN: 17 mg/dL (ref 8–23)
CO2: 23 mmol/L (ref 22–32)
CO2: 23 mmol/L (ref 22–32)
Calcium: 9.1 mg/dL (ref 8.9–10.3)
Calcium: 9.3 mg/dL (ref 8.9–10.3)
Chloride: 105 mmol/L (ref 98–111)
Chloride: 104 mmol/L (ref 98–111)
Creatinine, Ser: 1.91 mg/dL — ABNORMAL HIGH (ref 0.61–1.24)
Creatinine, Ser: 1.85 mg/dL — ABNORMAL HIGH (ref 0.61–1.24)
GFR, Estimated: 34 mL/min — ABNORMAL LOW
GFR, Estimated: 35 mL/min — ABNORMAL LOW
Glucose, Bld: 163 mg/dL — ABNORMAL HIGH (ref 70–99)
Glucose, Bld: 166 mg/dL — ABNORMAL HIGH (ref 70–99)
Potassium: 3.7 mmol/L (ref 3.5–5.1)
Potassium: 4 mmol/L (ref 3.5–5.1)
Sodium: 138 mmol/L (ref 135–145)
Sodium: 137 mmol/L (ref 135–145)
Total Bilirubin: 3.3 mg/dL — ABNORMAL HIGH (ref 0.0–1.2)
Total Bilirubin: 3.5 mg/dL — ABNORMAL HIGH (ref 0.0–1.2)
Total Protein: 6 g/dL — ABNORMAL LOW (ref 6.5–8.1)
Total Protein: 6.6 g/dL (ref 6.5–8.1)

## 2024-03-24 LAB — BPAM RBC
Blood Product Expiration Date: 202602132359
Blood Product Expiration Date: 202602142359
ISSUE DATE / TIME: 202601151115
ISSUE DATE / TIME: 202601151332
Unit Type and Rh: 6200
Unit Type and Rh: 6200

## 2024-03-24 LAB — LACTIC ACID, PLASMA: Lactic Acid, Venous: 1.5 mmol/L (ref 0.5–1.9)

## 2024-03-24 SURGERY — ULTRASOUND, UPPER GI TRACT, ENDOSCOPIC
Anesthesia: Monitor Anesthesia Care

## 2024-03-24 MED ORDER — SODIUM CHLORIDE 0.9% FLUSH
5.0000 mL | Freq: Three times a day (TID) | INTRAVENOUS | Status: DC
  Administered 2024-03-24 – 2024-03-30 (×17): 5 mL

## 2024-03-24 MED ORDER — HEPARIN SODIUM (PORCINE) 5000 UNIT/ML IJ SOLN
5000.0000 [IU] | Freq: Three times a day (TID) | INTRAMUSCULAR | Status: DC
  Administered 2024-03-25 – 2024-03-30 (×15): 5000 [IU] via SUBCUTANEOUS
  Filled 2024-03-24 (×15): qty 1

## 2024-03-24 MED ORDER — SODIUM CHLORIDE 0.9 % IV SOLN: INTRAVENOUS | Status: DC

## 2024-03-24 MED ORDER — PROPOFOL 500 MG/50ML IV EMUL
INTRAVENOUS | Status: DC | PRN
  Administered 2024-03-24: 20 mg via INTRAVENOUS
  Administered 2024-03-24: 110 ug/kg/min via INTRAVENOUS
  Administered 2024-03-24 (×2): 10 mg via INTRAVENOUS

## 2024-03-24 MED ORDER — LIDOCAINE 2% (20 MG/ML) 5 ML SYRINGE
INTRAMUSCULAR | Status: DC | PRN
  Administered 2024-03-24: 60 mg via INTRAVENOUS

## 2024-03-24 MED ORDER — PROPOFOL 500 MG/50ML IV EMUL
INTRAVENOUS | Status: AC
  Filled 2024-03-24: qty 50

## 2024-03-24 MED ORDER — PHENYLEPHRINE 80 MCG/ML (10ML) SYRINGE FOR IV PUSH (FOR BLOOD PRESSURE SUPPORT)
PREFILLED_SYRINGE | INTRAVENOUS | Status: DC | PRN
  Administered 2024-03-24 (×2): 80 ug via INTRAVENOUS

## 2024-03-24 NOTE — Transfer of Care (Signed)
 Immediate Anesthesia Transfer of Care Note  Patient: Luis Chen  Procedure(s) Performed: ULTRASOUND, UPPER GI TRACT, ENDOSCOPIC EGD (ESOPHAGOGASTRODUODENOSCOPY) FINE NEEDLE ASPIRATION  Patient Location: PACU and Endoscopy Unit  Anesthesia Type:MAC  Level of Consciousness: drowsy and patient cooperative  Airway & Oxygen Therapy: Patient Spontanous Breathing and Patient connected to face mask oxygen  Post-op Assessment: Report given to RN and Post -op Vital signs reviewed and stable  Post vital signs: Reviewed and stable  Last Vitals:  Vitals Value Taken Time  BP 96/27 03/24/24 10:00  Temp    Pulse 65 03/24/24 10:02  Resp 16 03/24/24 10:02  SpO2 100 % 03/24/24 10:02  Vitals shown include unfiled device data.  Last Pain:  Vitals:   03/24/24 0812  TempSrc: Temporal  PainSc: 0-No pain      Patients Stated Pain Goal: 3 (03/22/24 2328)  Complications: No notable events documented.

## 2024-03-24 NOTE — Anesthesia Postprocedure Evaluation (Signed)
"   Anesthesia Post Note  Patient: Luis Chen  Procedure(s) Performed: ULTRASOUND, UPPER GI TRACT, ENDOSCOPIC EGD (ESOPHAGOGASTRODUODENOSCOPY) FINE NEEDLE ASPIRATION     Patient location during evaluation: Endoscopy Anesthesia Type: MAC Level of consciousness: awake and alert Pain management: pain level controlled Vital Signs Assessment: post-procedure vital signs reviewed and stable Respiratory status: spontaneous breathing, nonlabored ventilation, respiratory function stable and patient connected to nasal cannula oxygen Cardiovascular status: blood pressure returned to baseline and stable Postop Assessment: no apparent nausea or vomiting Anesthetic complications: no   No notable events documented.  Last Vitals:  Vitals:   03/24/24 1025 03/24/24 1030  BP:  (!) 134/39  Pulse: 72 70  Resp: 16 20  Temp:    SpO2: 100% 100%    Last Pain:  Vitals:   03/24/24 1030  TempSrc:   PainSc: 0-No pain                 Luis Chen      "

## 2024-03-24 NOTE — Progress Notes (Signed)
 " PROGRESS NOTE    [name]  FMW:[id number] DOB: 02-18-1941 DOA: 03/14/2024 PCP: [name], MD    Chief Complaint  Patient presents with   Hypotension    Brief Narrative:  Luis Chen is a 84 y.o. male with PMH of CKD stage III, CAD, MGUS, HLD who presented with lightheadedness and low blood pressure, and weight loss over the last few weeks. Blood work in the ED showed elevated LFTs, significantly elevated alkaline phosphatase and bilirubin. CT abdomen/pelvis showed concern for pancreatic mass , labs showed AKI.  Gastroenterology and nephrology IR has been consulted.   S/p ERCP but unable to pass guidewire, now s/p perc biliary drain by IR.      Assessment & Plan:   Principal Problem:   Obstructive jaundice (HCC) Active Problems:   Anemia, chronic disease   Chronic kidney disease (CKD), stage III (moderate) (HCC)   MGUS (monoclonal gammopathy of unknown significance)   Coronary artery disease involving native coronary artery of native heart with other form of angina pectoris   Transaminitis   Pancreatic mass   Elevated bilirubin   Abnormal CT of the abdomen   Elevated liver enzymes  #1 obstructive jaundice secondary to pancreatic head mass -MRCP done with concerns for pancreatic head mass associated with severe dorsal pancreatic duct and biliary dilatation. - Patient seen in consultation by GI and underwent ERCP however unable to pass guidewire and IR consulted for percutaneous drain placement which was done 03/17/2024. -Percutaneous drain with output of 1.275 L over the past 24 hours. - CA 19-9 noted to be elevated at 2010 - Cytology done with no malignant cells identified. - LFTs trending down. - Patient was on IV Zosyn  which was discontinued per GI recommendations. - Patient being followed by GI and patient s/p EUS and FNA this morning, 03/24/2024.   -Biopsy pending. -Per GI.  2.  AKI on CKD stage IIIa - Baseline creatinine noted at 1.3-1.5 in 2024 and  October 2025. - CT done with no hydronephrosis and bladder thickening possible due to bladder outlet obstruction. - AKI felt likely secondary to volume depletion and bladder outlet obstruction. - Foley catheter in place. -Urine output of 1.275 L over the past 24 hours. - Patient was hydrated with IV fluids with renal function trending down creatinine down to 1.91 today. - Status post transfusion 2 units PRBCs. - Nephrology following and feel patient will need urology outpatient follow-up or TOV prior to discharge.  3.  Metabolic acidosis -Resolved.  4.  MGUS with normocytic anemia - Patient with no overt bleeding. - Status posttransfusion 2 units PRBCs with hemoglobin at 8.9 today from 6.8. -GI following. - Transfuse for hemoglobin < 7.  5.  CAD -Antiplatelets on hold and resume once cleared by GI and post procedures.  6.  Glaucoma - Continue eyedrops.     DVT prophylaxis: Heparin  Code Status: Full Family Communication: Updated patient and daughter at bedside. Disposition: Likely home when clinically improved and cleared by GI.  Status is: Inpatient Remains inpatient appropriate because: Severity of illness   Consultants:  Gastroenterology: Dr. Abran 03/16/2024 Nephrology: Dr. Vassie 03/16/2024 Interventional radiology: Dr.Mugweru 03/17/2018  Procedures:  CT abdomen and pelvis 03/14/2024 MRCP 03/15/2024 ERCP 03/17/2024 by Dr. Abran Percutaneous transhepatic biliary tube placement anterograde cholangiogram by IR: Dr.Mugweru 03/17/2024 Transfusion 2 units PRBCs 03/23/2024 EUS per GI: Dr. Rollin 03/24/2024  Antimicrobials:  Anti-infectives (From admission, onward)    Start     Dose/Rate Route Frequency Ordered Stop  03/17/24 1615  cefTRIAXone  (ROCEPHIN ) 2 g in sodium chloride  0.9 % 100 mL IVPB        2 g 200 mL/hr over 30 Minutes Intravenous  Once 03/17/24 1517 03/20/24 1256   03/15/24 2000  piperacillin -tazobactam (ZOSYN ) IVPB 2.25 g  Status:  Discontinued        2.25 g 100 mL/hr  over 30 Minutes Intravenous Every 8 hours 03/15/24 1555 03/21/24 1129   03/15/24 0800  piperacillin -tazobactam (ZOSYN ) IVPB 3.375 g  Status:  Discontinued        3.375 g 12.5 mL/hr over 240 Minutes Intravenous Every 12 hours 03/15/24 0520 03/15/24 1555   03/15/24 0600  piperacillin -tazobactam (ZOSYN ) IVPB 2.25 g  Status:  Discontinued       Placed in Followed by Linked Group   2.25 g 100 mL/hr over 30 Minutes Intravenous Every 6 hours 03/14/24 2217 03/15/24 0519   03/14/24 2230  piperacillin -tazobactam (ZOSYN ) IVPB 3.375 g       Placed in Followed by Linked Group   3.375 g 100 mL/hr over 30 Minutes Intravenous  Once 03/14/24 2217 03/15/24 9394         Subjective: Patient sitting up in recliner.  Patient just returned from US /ERCP.  Patient denies any chest pain, no shortness of breath, no abdominal pain.  States fatigue improved after transfusion of PRBCs.    Objective: Vitals:   03/24/24 1015 03/24/24 1020 03/24/24 1025 03/24/24 1030  BP:  (!) 121/46  (!) 134/39  Pulse: 62 76 72 70  Resp: 15 18 16 20   Temp:      TempSrc:      SpO2: 100% 100% 100% 100%  Weight:      Height:        Intake/Output Summary (Last 24 hours) at 03/24/2024 1128 Last data filed at 03/24/2024 1002 Gross per 24 hour  Intake 913.46 ml  Output 2500 ml  Net -1586.54 ml   Filed Weights   03/21/24 0432 03/23/24 0517 03/24/24 0504  Weight: 67.5 kg 58.7 kg 61.3 kg    Examination:  General exam: NAD. Respiratory system: CTAB.  No wheezes, no crackles, no rhonchi.  Fair air movement.  Speaking in full sentences.  Cardiovascular system: Regular rate rhythm no murmurs rubs or gallops.  No JVD.  No lower extremity edema.  Gastrointestinal system: Abdomen is soft, nontender, nondistended, positive bowel sounds.  No rebound.  No guarding.  Percutaneous drain in place on the right with bilious drainage noted.  Central nervous system: Alert and oriented.  Moving extremities spontaneously.  No focal  neurological deficits. Extremities: Symmetric 5 x 5 power. Skin: No rashes, lesions or ulcers Psychiatry: Judgement and insight appear normal. Mood & affect appropriate.     Data Reviewed: I have personally reviewed following labs and imaging studies  CBC: Recent Labs  Lab 03/20/24 0355 03/21/24 0405 03/22/24 1303 03/23/24 0422 03/23/24 1735 03/24/24 0410  WBC 8.9 8.6 8.4 9.5  --  8.7  NEUTROABS  --   --  5.4 6.1  --   --   HGB 7.4* 7.2* 7.9* 6.8* 9.5* 8.9*  HCT 18.8* 19.5* 22.8* 19.1* 26.5* 25.1*  MCV 84.7 85.2 90.1 88.8  --  87.5  PLT 230 238 264 252  --  241    Basic Metabolic Panel: Recent Labs  Lab 03/19/24 0415 03/20/24 0355 03/22/24 1303 03/23/24 0422 03/24/24 0410  NA 138 140 138 137 138  K 3.6 3.4* 3.9 3.7 3.7  CL 100 103 103 105 105  CO2 29 29 26 25 23   GLUCOSE 101* 98 99 105* 163*  BUN 23 21 18 16 16   CREATININE 2.93* 2.65* 2.03* 1.94* 1.91*  CALCIUM  8.6* 8.7* 9.4 9.1 9.1  MG  --   --  1.7  --   --   PHOS 2.5 3.0 2.9  --   --     GFR: Estimated Creatinine Clearance: 24.5 mL/min (A) (by C-G formula based on SCr of 1.91 mg/dL (H)).  Liver Function Tests: Recent Labs  Lab 03/20/24 0355 03/21/24 0405 03/22/24 1303 03/23/24 0422 03/24/24 0410  AST 64* 49* 42* 35 38  ALT 117* 99* 88* 70* 60*  ALKPHOS 1,049* 879* 940* 796* 715*  BILITOT 2.7* 2.4* 2.9* 2.9* 3.3*  PROT 5.3* 5.5* 6.6 6.0* 6.0*  ALBUMIN  2.5*  2.5* 2.6* 3.1* 2.9* 2.8*    CBG: No results for input(s): GLUCAP in the last 168 hours.   Recent Results (from the past 240 hours)  Culture, blood (routine x 2)     Status: None   Collection Time: 03/14/24  5:33 PM   Specimen: BLOOD  Result Value Ref Range Status   Specimen Description   Final    BLOOD LEFT ANTECUBITAL Performed at [facility], [address] [place], [place] [address]    Special Requests   Final    BOTTLES DRAWN AEROBIC AND ANAEROBIC Blood Culture adequate volume Performed at [facility], [address] [place], [place] [address]    Culture   Final    NO GROWTH 5 DAYS Performed at [facility], [address], [place], [place] [address]    Report Status 03/19/2024 FINAL  Final  Culture, blood (routine x 2)     Status: None   Collection Time: 03/14/24  5:50 PM   Specimen: BLOOD RIGHT ARM  Result Value Ref Range Status   Specimen Description   Final    BLOOD RIGHT ARM Performed at [facility], [address] [place], [place] [address]    Special Requests   Final    BOTTLES DRAWN AEROBIC ONLY Blood Culture adequate volume Performed at [facility], [address] [place], [place] [address]    Culture   Final    NO GROWTH 5 DAYS Performed at [facility], [address], [place], [place] [address]    Report Status 03/19/2024 FINAL  Final  Aerobic/Anaerobic Culture w Gram Stain (surgical/deep wound)     Status: None   Collection Time: 03/17/24  5:58 PM   Specimen: BILE  Result Value Ref Range Status   Specimen Description   Final    BILE Performed at [facility], [address], [place], [place] [address]    Special Requests   Final    NONE Performed at [facility], [address], [place], [place] [address]    Gram Stain NO WBC SEEN NO ORGANISMS SEEN   Final   Culture   Final    No growth aerobically or anaerobically. Performed at [facility], [address] [place], [place] [address]    Report Status 03/22/2024 FINAL  Final         Radiology Studies: No results found.      Scheduled Meds:  Chlorhexidine  Gluconate Cloth  6 each Topical Daily   diclofenac   100 mg Rectal Once   heparin  injection (subcutaneous)  5,000 Units Subcutaneous Q8H   latanoprost   1 drop Both Eyes  Daily   lidocaine -EPINEPHrine   20 mL Intradermal Once   meperidine  (DEMEROL ) injection  25 mg Intravenous Once   senna  1 tablet Oral BID   Continuous  Infusions:   LOS: 9 days    Time spent: 40 minutes    Toribio Hummer, MD [facility]   To contact the attending provider between 7A-7P or the covering provider during after hours 7P-7A, please log into the web site [email or web address] and access using universal [place] password for that web site. If you do not have the password, please call the hospital operator.  03/24/2024, 11:28 AM    "

## 2024-03-24 NOTE — TOC Progression Note (Addendum)
 Transition of Care [facility]) - Progression Note   Patient Details  Name: Luis Chen MRN: [id number] Date of Birth: [birth year]  [facility] CM/SW Contact  Duwaine GORMAN Aran, LCSW Phone Number: 03/24/2024, 10:29 AM  Clinical Narrative: PT evaluation did not recommend any PT follow up or DME at this time.  Addendum: CSW notified by treatment team that daughter is requesting [facility] for patient's drain through the [facility]. CSW met with patient and daughter to discuss HHRN. Family aware it will not be a daily service. Patient reported his PCP at the [facility] is Dr. Melodie. CSW made [facility] referral in hub.  Expected Discharge Plan: Home/Self Care Barriers to Discharge: Continued Medical Work up  Expected Discharge Plan and Services Living arrangements for the past 2 months: Single Family Home  Social Drivers of Health (SDOH) Interventions SDOH Screenings   Food Insecurity: No Food Insecurity (03/15/2024)  Housing: Low Risk (03/15/2024)  Transportation Needs: Unmet Transportation Needs (03/15/2024)  Utilities: Not At Risk (03/15/2024)  Depression (PHQ2-9): Low Risk (04/01/2023)  Financial Resource Strain: Low Risk (04/01/2023)  Physical Activity: Sufficiently Active (04/01/2023)  Social Connections: Moderately Integrated (03/15/2024)  Stress: No Stress Concern Present (04/01/2023)  Tobacco Use: Low Risk (03/17/2024)  Health Literacy: Adequate Health Literacy (04/01/2023)   Readmission Risk Interventions     No data to display

## 2024-03-24 NOTE — Discharge Instructions (Signed)
 Interventional Radiology Percutaneous Abscess Drain Placement After Care   This sheet gives you information about how to care for yourself after your procedure. Your health care provider may also give you more specific instructions. Your drain was placed by an interventional radiologist with [facility]. If you have questions or concerns, contact [facility] at [phone]  What is a percutaneous drain?   A drain is a small plastic tube (catheter) that goes into the fluid collection in your body through your skin.   How long will I need the drain?   How long the drain needs to stay in is determined by where the drain is, how much comes out of the drain each day and if you are having any other surgical procedures.   Interventional radiology will determine when it is time to remove the drain. It is important to follow up as directed so that the drain can be removed as soon as it is safe to do so.   What can I expect after the procedure?   After the procedure, it is common to have:   A small amount of bruising and discomfort in the area where the drainage tube (catheter) was placed.   Sleepiness and fatigue. This should go away after the medicines you were given have worn off.   Follow these instructions at home:   Insertion site care   Check your insertion site when you change the bandage. Check for:   More redness, swelling, or pain.   More fluid or blood.   Warmth.   Pus or a bad smell.   When caring for your insertion site:   Wash your hands with soap and water  for at least 20 seconds before and after you change your bandage (dressing). If soap and water  are not available, use hand sanitizer.   You do not need to change your dressing everyday if it is clean and dry. Change your dressing every 3 days or as needed when it is soiled, wet or becoming dislodged. You will need to change your dressing each time you shower.   Leave stitches  (sutures), skin glue, or adhesive strips in place. These skin closures may need to stay in place for 2 weeks or longer. If adhesive strip edges start to loosen and curl up, you may trim the loose edges. Do not remove adhesive strips completely unless your health care provider tells you to do so.   Catheter care   Flush the catheter once per day with 5 mL of 0.9% normal saline unless you are told otherwise by your healthcare provider. This helps to prevent clogs in the catheter.   To disconnect the drain, turn the clear plastic tube to the left. Attach the saline syringe by placing it on the white end of the drain and turning gently to the right. Once attached gently push the plunger to the 5 mL mark. After you are done flushing, disconnect the syringe by turning to the left and reattach your drainage container   If you have a bulb please be sure the bulb is charged after reconnecting it - to do this pinch the bulb between your thumb and first finger and close the stopper located on the top of the bulb.    Check for fluid leaking from around your catheter (instead of fluid draining through your catheter). This may be a sign that the drain is no longer working correctly.   Write down the following information every time you empty your bag:  The date and time.   The amount of drainage.   Activity   Rest at home for 1-2 days after your procedure.   For the first 48 hours do not lift anything more than 10 lbs (about a gallon of milk). You may perform moderate activities/exercise. Please avoid strenuous activities during this time.   Avoid any activities which may pull on your drain as this can cause your drain to become dislodged.   If you were given a sedative during the procedure, it can affect you for several hours. Do not drive or operate machinery until your health care provider says that it is safe.   General instructions   For mild pain take over-the-counter medications as needed for  pain such as Tylenol  or Advil. If you are experiencing severe pain please call our office as this may indicate an issue with your drain.    If you were prescribed an antibiotic medicine, take it as told by your health care provider. Do not stop using the antibiotic even if you start to feel better.   You may shower 24 hours after the drain is placed. To do this cover the insertion site with a water  tight material such as saran wrap and seal the edges with tape, you may also purchase waterproof dressings at your local drug store. Shower as usual and then remove the water  tight dressing and any gauze/tape underneath it once you have exited the shower and dried off. Allow the area to air dry or pat dry with a clean towel. Once the skin is completely dry place a new gauze dressing. It is important to keep the site dry at all times to prevent infection.   Do not submerge the drain - this means you cannot take baths, swim, use a hot tub, etc. until the drain is removed.    Do not use any products that contain nicotine  or tobacco, such as cigarettes, e-cigarettes, and chewing tobacco. If you need help quitting, ask your health care provider.   Keep all follow-up visits as told by your health care provider. This is important.   Contact a health care provider if:   You have less than 10 mL of drainage a day for 2-3 days in a row, or as directed by your health care provider.   You have any of these signs of infection:   More redness, swelling, or pain around your incision area.   More fluid or blood coming from your incision area.   Warmth coming from your incision area.   Pus or a bad smell coming from your incision area.   You have fluid leaking from around your catheter (instead of through your catheter).   You are unable to flush the drain.   You have a fever or chills.   You have pain that does not get better with medicine.   You have not been contacted to schedule a drain follow up  appointment within 10 days of discharge from the hospital.   Please call [facility] at [phone] with any questions or concerns.   Get help right away if:   Your catheter comes out.   You suddenly stop having drainage from your catheter.   You suddenly have blood in the fluid that is draining from your catheter.   You become dizzy or you faint.   You develop a rash.   You have nausea or vomiting.   You have difficulty breathing or you feel short of breath.  You develop chest pain.   You have problems with your speech or vision.   You have trouble balancing or moving your arms or legs.   Summary   It is common to have a small amount of bruising and discomfort in the area where the drainage tube (catheter) was placed. You may also have minor discomfort with movement while the drain is in place.   Flush the drain once per day with 5 mL of 0.9% normal saline (unless you were told otherwise by your healthcare provider).    Record the amount of drainage from the bag every time you empty it.   Change the dressing every 3 days or earlier if soiled/wet. Keep the skin dry under the dressing.   You may shower with the drain in place. Do not submerge the drain (no baths, swimming, hot tubs, etc.).   Contact [facility] at [phone] if you have more redness, swelling, or pain around your incision area or if you have pain that does not get better with medicine.   This information is not intended to replace advice given to you by your health care provider. Make sure you discuss any questions you have with your health care provider.   Document Revised: 05/29/2021 Document Reviewed: 02/18/2019   Elsevier Patient Education  2023 [employer]         Interventional Radiology Drain Record   Empty your drain at least once per day. You may empty it as often as needed. Use this form to write down the amount of fluid that has collected in the drainage  container. Bring this form with you to your follow-up visits. Please call [facility] at [phone] with any questions or concerns prior to your appointment.   Drain #1 location: ___________________   Date __________ Time __________ Amount __________   Date __________ Time __________ Amount __________   Date __________ Time __________ Amount __________   Date __________ Time __________ Amount __________   Date __________ Time __________ Amount __________   Date __________ Time __________ Amount __________   Date __________ Time __________ Amount __________   Date __________ Time __________ Amount __________   Date __________ Time __________ Amount __________   Date __________ Time __________ Amount __________   Date __________ Time __________ Amount __________   Date __________ Time __________ Amount __________   Date __________ Time __________ Amount __________   Date __________ Time __________ Amount __________

## 2024-03-24 NOTE — Plan of Care (Signed)
 Patient without complaint post procedure, up in chair with family members in room.  This RN did demonstrate to daughter and nephew (who lives with patient) how to flush biliary drain.

## 2024-03-24 NOTE — Anesthesia Preprocedure Evaluation (Signed)
 "                                  Anesthesia Evaluation  Patient identified by MRN, date of birth, ID band Patient awake    Reviewed: Allergy & Precautions, NPO status , Patient's Chart, lab work & pertinent test results  History of Anesthesia Complications Negative for: history of anesthetic complications  Airway Mallampati: II  TM Distance: >3 FB Neck ROM: Full    Dental  (+) Upper Dentures, Partial Lower,    Pulmonary neg pulmonary ROS   Pulmonary exam normal breath sounds clear to auscultation       Cardiovascular Exercise Tolerance: Good hypertension, Pt. on medications and Pt. on home beta blockers + CAD and + CABG (2019)  Normal cardiovascular exam Rhythm:Regular Rate:Normal  06/2021 TTE  1. Left ventricular ejection fraction, by estimation, is 50 to 55%. The  left ventricle has low normal function. The left ventricle demonstrates  global hypokinesis. Left ventricular diastolic parameters were normal. The  average left ventricular global  longitudinal strain is -14.6 %. The global longitudinal strain is normal.   2. Right ventricular systolic function is normal. The right ventricular  size is normal. There is normal pulmonary artery systolic pressure.   3. Left atrial size was mildly dilated.   4. Right atrial size was mildly dilated.   5. The mitral valve is normal in structure. Mild mitral valve  regurgitation. No evidence of mitral stenosis.   6. Tricuspid valve regurgitation is mild to moderate.   7. The aortic valve is tricuspid. There is moderate calcification of the  aortic valve. There is moderate thickening of the aortic valve. Aortic  valve regurgitation is mild. Aortic valve sclerosis is present, with no  evidence of aortic valve stenosis.   8. Aortic dilatation noted. There is mild dilatation of the aortic root,  measuring 40 mm. There is mild dilatation of the ascending aorta,  measuring 39 mm.   9. The inferior vena cava is normal in  size with greater than 50%  respiratory variability, suggesting right atrial pressure of 3 mmHg.      Neuro/Psych  Neuromuscular disease    GI/Hepatic negative GI ROS, Neg liver ROS,,,  Endo/Other  negative endocrine ROS    Renal/GU CRFRenal disease Stage 3   Lab Results      Component                Value               Date                            K                        4.0                 03/16/2024                    CREATININE               3.20 (H)            03/16/2024                           Musculoskeletal  (+) Arthritis ,    Abdominal  Peds  Hematology  (+) Blood dyscrasia, anemia   Anesthesia Other Findings Past Medical History: 07/01/2015: Anemia, chronic disease No date: Angina decubitus 10/31/2008: Benign essential hypertension     Comment:  Qualifier: Diagnosis of  By: Lavona, MD, CODY Agent   No date: CAD (coronary artery disease)     Comment:  non obstructive. Left main normal. LAd proximal long 25%              stenosis, termingating as focal 50% prox to mid lesion.               First & second diag were small, normal. Circumflex in               proximal av groove had luminal irregularities.. Was large              mid obtuse marg which was branching, scattered luminal               irregul. Infer branch did supply some septal perforators.              Was PDA,small & normal.  < 1.73mm. There was prox 95%               stenosis 11/27/2008: CARDIOMYOPATHY No date: CKD (chronic kidney disease) No date: Hyperlipidemia     Comment:  transient history, controlled on diet No date: Hypertension 10/30/2008: Mixed hyperlipidemia     Comment:  Qualifier: Diagnosis of  By: Leana, CNA, [name]   07/03/2015: Non-traumatic rhabdomyolysis 07/01/2015: Polyarthropathy 12/10/2014: Primary osteoarthritis of knees, bilateral No date: Pyogenic arthritis of left knee joint (HCC) 07/01/2015: Swelling of left knee joint 02/08/2018: Systolic HF (heart failure)  (HCC)   Reproductive/Obstetrics                              Anesthesia Physical Anesthesia Plan  ASA: 3  Anesthesia Plan: MAC   Post-op Pain Management: Minimal or no pain anticipated   Induction: Intravenous  PONV Risk Score and Plan: 1 and Treatment may vary due to age or medical condition, Ondansetron  and Dexamethasone   Airway Management Planned: Natural Airway and Nasal Cannula  Additional Equipment: None  Intra-op Plan:   Post-operative Plan:   Informed Consent: I have reviewed the patients History and Physical, chart, labs and discussed the procedure including the risks, benefits and alternatives for the proposed anesthesia with the patient or authorized representative who has indicated his/her understanding and acceptance.     Dental advisory given  Plan Discussed with: CRNA and Surgeon  Anesthesia Plan Comments: (Discussed risks of anesthesia with patient, including possibility of difficulty with spontaneous ventilation under anesthesia necessitating airway intervention, PONV, and rare risks such as cardiac or respiratory or neurological events, and allergic reactions. Discussed the role of CRNA in patient's perioperative care. Patient understands.)         Anesthesia Quick Evaluation  "

## 2024-03-24 NOTE — Progress Notes (Signed)
 "   Referring Physician(s): Perry,J  Supervising Physician: Luverne Aran  Patient Status:  [facility] - In-pt  Chief Complaint: Biliary obstruction, pancreatic mass; s/p I/E biliary drain placement 03/17/24    Subjective: Pt doing ok this afternoon; sitting in chair; family in room; s/p EUS/FNA panc head mass earlier today; denies fever, worsening abd pain,N/V   Allergies: Ace inhibitors and Entresto  [sacubitril-valsartan]  Medications: Prior to Admission medications  Medication Sig Start Date End Date Taking? Authorizing Provider  aspirin  81 MG EC tablet Take 1 tablet (81 mg total) by mouth daily. 07/28/19  Yes Meng, Hao, PA  carvedilol  (COREG ) 6.25 MG tablet Take 0.5 tablets (3.125 mg total) by mouth 2 (two) times daily with a meal. 05/28/20  Yes [name], MD  Cholecalciferol (VITAMIN D3) 1000 units CAPS Take 1,000 Units by mouth daily.   Yes [provider]  diclofenac  Sodium (VOLTAREN ) 1 % GEL 2 g 4 (four) times daily as needed (for pain- left knee and any other affected area). 03/08/23  Yes [provider]  ezetimibe  (ZETIA ) 10 MG tablet Take 10 mg by mouth daily.   Yes [provider]  Ferrous Gluconate  324 (37.5 Fe) MG TABS TAKE 1 TABLET BY MOUTH TWICE DAILY WITH MEALS Patient taking differently: Take 324 mg by mouth See admin instructions. Take 324 mg by mouth with food on Mon/Wed/Fri 02/08/18  Yes Shadad, Firas N, MD  KETOTIFEN FUMARATE OP Place 1 drop into both eyes See admin instructions. Ketotifen 0.025% eye drops - Instill 1 drop into both eyes one to two times a day   Yes [provider]  latanoprost  (XALATAN ) 0.005 % ophthalmic solution Place 1 drop into both eyes at bedtime. 01/22/17  Yes [provider]  losartan  (COZAAR ) 50 MG tablet Take 50 mg by mouth 2 (two) times daily.   Yes [provider]  Nutritional Supplements (SUPLENA/CARB STEADY) LIQD Take 237 mLs by mouth See admin instructions. Drink 237 ml's of  Vanilla Suplena by mouth three times a day   Yes [provider]  rosuvastatin  (CRESTOR ) 40 MG tablet TAKE 1 TABLET BY MOUTH ONCE DAILY AT  6PM Patient taking differently: Take 40 mg by mouth at bedtime. 11/07/18  Yes [name], MD  traZODone  (DESYREL ) 150 MG tablet Take 150 mg by mouth at bedtime as needed for sleep.    Yes [provider]  trolamine salicylate (ASPERCREME) 10 % cream Apply 1 Application topically as needed (for left knee pain and/or other affected areas).   Yes [provider]  ezetimibe  (ZETIA ) 10 MG tablet Take 1 tablet (10 mg total) by mouth daily. Patient not taking: Reported on 03/15/2024 12/05/18 03/15/24  Rolan Ezra RAMAN, MD  oxyCODONE -acetaminophen  (PERCOCET/ROXICET) 5-325 MG tablet Take 1 tablet by mouth every 6 (six) hours as needed for severe pain (pain score 7-10). Patient not taking: Reported on 03/15/2024 03/09/23   Henderly, Britni A, PA-C  spironolactone  (ALDACTONE ) 25 MG tablet Take 1 tablet (25 mg total) by mouth daily. Patient not taking: Reported on 03/15/2024 12/06/18 03/15/24  Rolan Ezra RAMAN, MD     Vital Signs: BP (!) 134/39   Pulse 70   Temp 98.6 F (37 C) (Temporal)   Resp 20   Ht 5' 10 (1.778 m)   Wt 135 lb 2.3 oz (61.3 kg)   SpO2 100%   BMI 19.39 kg/m   Physical Exam awake/alert;  rt biliary drain intact, insertion site ok, not sig tender , OP 250 cc today,  1.2 liters yesterday; drain flushes continue  Imaging: No results found.  Labs:  CBC: Recent Labs    03/21/24 0405 03/22/24 1303 03/23/24 0422 03/23/24 1735 03/24/24 0410  WBC 8.6 8.4 9.5  --  8.7  HGB 7.2* 7.9* 6.8* 9.5* 8.9*  HCT 19.5* 22.8* 19.1* 26.5* 25.1*  PLT 238 264 252  --  241    COAGS: Recent Labs    03/14/24 1730 03/16/24 0447  INR 1.0 1.1    BMP: Recent Labs    03/20/24 0355 03/22/24 1303 03/23/24 0422 03/24/24 0410  NA 140 138 137 138  K 3.4* 3.9 3.7 3.7  CL 103 103 105 105  CO2 29 26 25 23   GLUCOSE 98 99 105* 163*   BUN 21 18 16 16   CALCIUM  8.7* 9.4 9.1 9.1  CREATININE 2.65* 2.03* 1.94* 1.91*  GFRNONAA 23* 32* 33* 34*    LIVER FUNCTION TESTS: Recent Labs    03/21/24 0405 03/22/24 1303 03/23/24 0422 03/24/24 0410  BILITOT 2.4* 2.9* 2.9* 3.3*  AST 49* 42* 35 38  ALT 99* 88* 70* 60*  ALKPHOS 879* 940* 796* 715*  PROT 5.5* 6.6 6.0* 6.0*  ALBUMIN  2.6* 3.1* 2.9* 2.8*    Assessment and Plan: 84 y.o. male with past medical history significant for anemia, chronic kidney disease, coronary artery disease with prior CABG, cardiomyopathy, MGUS, hypertension, CHF, glaucoma, hyperlipidemia, osteoarthritis who was [facility] to [facility] on 1/7 with weight loss, lightheadedness, low blood pressure, elevated liver function tests, obstructive jaundice with associated pancreatic head mass, biliary and pancreatic duct dilatation concerning for pancreatic adenocarcinoma, mild gallbladder distention and mild gallbladder wall thickening; s/p right I/E biliary drain placement on 03/17/24; bile cytology neg; s/p EUS/FNA panc head mass today;  afebrile, WBC nl, hgb 8.9(9.5), creat 1.91(1.94), K 3.7, t bili 3.3(2.9), bile cx neg; as OP rec once daily flush of biliary drain with 5 cc sterile saline, output recording and gauze dressing change every 2-3 days; pt is scheduled for f/u cholangiogram with drain exchange on 05/12/24; call [phone] with any drain related questions; other plans as per TRH/GI/NEPH   Electronically Signed: D. Franky Rakers, PA-C 03/24/2024, 1:56 PM   I spent a total of 15 Minutes at the the patient's bedside AND on the patient's hospital floor or unit, greater than 50% of which was counseling/coordinating care for biliary drain    Patient ID: Luis Chen, male   DOB: 05/08/1940, 84 y.o.   MRN: [id number]  "

## 2024-03-24 NOTE — Anesthesia Procedure Notes (Signed)
 Procedure Name: MAC Date/Time: 03/24/2024 8:50 AM  Performed by: Memory Armida LABOR, CRNAPre-anesthesia Checklist: Patient identified, Emergency Drugs available, Suction available, Patient being monitored and Timeout performed Patient Re-evaluated:Patient Re-evaluated prior to induction Oxygen Delivery Method: Simple face mask Placement Confirmation: positive ETCO2 Dental Injury: Teeth and Oropharynx as per pre-operative assessment

## 2024-03-24 NOTE — Op Note (Signed)
 [facility] Patient Name: Luis Chen Procedure Date: 03/24/2024 MRN: [id number] Attending MD: Belvie Just , MD, [id number] Date of Birth: 08-31-1940 CSN: [id number] Age: 84 Admit Type: Inpatient Procedure:                Upper EUS Indications:              Suspected solid pancreatic neoplasm Providers:                Belvie Just, MD, Randall Lines, RN, Haskel Chris, Technician Referring MD:              Medicines:                Propofol  per Anesthesia Complications:            No immediate complications. Estimated Blood Loss:     Estimated blood loss: none. Procedure:                Pre-Anesthesia Assessment:                           - Prior to the procedure, a History and Physical                            was performed, and patient medications and                            allergies were reviewed. The patient's tolerance of                            previous anesthesia was also reviewed. The risks                            and benefits of the procedure and the sedation                            options and risks were discussed with the patient.                            All questions were answered, and informed consent                            was obtained. Prior Anticoagulants: The patient has                            taken heparin , last dose was 1 day prior to                            procedure. ASA Grade Assessment: III - A patient                            with severe systemic disease. After reviewing the  risks and benefits, the patient was deemed in                            satisfactory condition to undergo the procedure.                           - Sedation was administered by an anesthesia                            professional. Deep sedation was attained.                           After obtaining informed consent, the endoscope was                            passed under direct  vision. Throughout the                            procedure, the patient's blood pressure, pulse, and                            oxygen saturations were monitored continuously. The                            [id number] ([id number] Olympus ultrasound scope was                            introduced through the mouth, and advanced to the                            second part of duodenum. The upper EUS was                            performed with difficulty. Scope In: Scope Out: Findings:      ENDOSCOPIC FINDING: :      A 10 cm hiatal hernia was present.      ENDOSONOGRAPHIC FINDING: :      An irregular mass was identified in the pancreatic head. The mass was       hypoechoic. The mass measured 22 mm by 24 mm in maximal cross-sectional       diameter. The endosonographic borders were poorly-defined. The remainder       of the pancreas was examined. The endosonographic appearance of       parenchyma and the upstream pancreatic duct indicated a maximum duct       diameter of 10 mm. Fine needle aspiration for cytology was performed.       Color Doppler imaging was utilized prior to needle puncture to confirm a       lack of significant vascular structures within the needle path. Six       passes were made with the 22 gauge needle using a transduodenal       approach. A stylet was used. A cytotechnologist was present to evaluate       the adequacy of the specimen. The cellularity of the specimen was       adequate. Final cytology results are pending.  The EUS was technically difficult as the patient had a large 10 cm       hiatal hernia. This distored positioning of the scope and the anatomy       was difficult to interrogate. With persistence a short positioning was       obtained and the echoencoscope was in a stable position in the second       portion of the duodenum. The IR pigtail stent was grossly visualized and       there was drainage of bile. An irregularly marginated hypoechoic  mass       was found in the head of the pancreas. There was proximal biliary ductal       dilation and PD dilation measuring 10 mm for each duct. No stones or       slugde were noted. A total of 6 passes with the 22 gauge FNA needle was       performed and adequate cellularity was obtained. Clear views of the       portal confluenc, Celiac axis, and SMA were obtained. There was no       malignant involvement of these vessels. The subcarinal view was negative       for any LAD. The intrahepatic ducts were not dilated. In the body ant       tail of the pancreas, the PD measured 10 mm in the setting of a severely       atrophied pancreas. There was more duct than pancreatic parenchyma in       this area. Impression:               - 10 cm hiatal hernia.                           - A mass was identified in the pancreatic head.                            Fine needle aspiration performed. Moderate Sedation:      Not Applicable - Patient had care per Anesthesia. Recommendation:           - Return patient to hospital ward for ongoing care.                           - Resume regular diet. Procedure Code(s):        --- Professional ---                           [phone], Esophagogastroduodenoscopy, flexible,                            transoral; with transendoscopic ultrasound-guided                            intramural or transmural fine needle                            aspiration/biopsy(s), (includes endoscopic                            ultrasound examination limited to the esophagus,  stomach or duodenum, and adjacent structures) Diagnosis Code(s):        --- Professional ---                           K86.89, Other specified diseases of pancreas                           K44.9, Diaphragmatic hernia without obstruction or                            gangrene CPT copyright 2022 American Medical Association. All rights reserved. The codes documented in this report are  preliminary and upon coder review may  be revised to meet current compliance requirements. Belvie Just, MD Belvie Just, MD 03/24/2024 10:22:36 AM This report has been signed electronically. Number of Addenda: 0

## 2024-03-25 DIAGNOSIS — N1831 Chronic kidney disease, stage 3a: Secondary | ICD-10-CM | POA: Diagnosis not present

## 2024-03-25 DIAGNOSIS — D472 Monoclonal gammopathy: Secondary | ICD-10-CM | POA: Diagnosis not present

## 2024-03-25 DIAGNOSIS — R509 Fever, unspecified: Secondary | ICD-10-CM

## 2024-03-25 DIAGNOSIS — K8689 Other specified diseases of pancreas: Secondary | ICD-10-CM | POA: Diagnosis not present

## 2024-03-25 DIAGNOSIS — I25118 Atherosclerotic heart disease of native coronary artery with other forms of angina pectoris: Secondary | ICD-10-CM | POA: Diagnosis not present

## 2024-03-25 DIAGNOSIS — R748 Abnormal levels of other serum enzymes: Secondary | ICD-10-CM | POA: Diagnosis not present

## 2024-03-25 DIAGNOSIS — D638 Anemia in other chronic diseases classified elsewhere: Secondary | ICD-10-CM | POA: Diagnosis not present

## 2024-03-25 DIAGNOSIS — K831 Obstruction of bile duct: Secondary | ICD-10-CM | POA: Diagnosis not present

## 2024-03-25 DIAGNOSIS — R7401 Elevation of levels of liver transaminase levels: Secondary | ICD-10-CM | POA: Diagnosis not present

## 2024-03-25 LAB — COMPREHENSIVE METABOLIC PANEL WITH GFR
ALT: 55 U/L — ABNORMAL HIGH (ref 0–44)
AST: 48 U/L — ABNORMAL HIGH (ref 15–41)
Albumin: 3 g/dL — ABNORMAL LOW (ref 3.5–5.0)
Alkaline Phosphatase: 686 U/L — ABNORMAL HIGH (ref 38–126)
Anion gap: 9 (ref 5–15)
BUN: 15 mg/dL (ref 8–23)
CO2: 25 mmol/L (ref 22–32)
Calcium: 9.4 mg/dL (ref 8.9–10.3)
Chloride: 105 mmol/L (ref 98–111)
Creatinine, Ser: 1.8 mg/dL — ABNORMAL HIGH (ref 0.61–1.24)
GFR, Estimated: 36 mL/min — ABNORMAL LOW
Glucose, Bld: 96 mg/dL (ref 70–99)
Potassium: 3.8 mmol/L (ref 3.5–5.1)
Sodium: 138 mmol/L (ref 135–145)
Total Bilirubin: 3.9 mg/dL — ABNORMAL HIGH (ref 0.0–1.2)
Total Protein: 6.4 g/dL — ABNORMAL LOW (ref 6.5–8.1)

## 2024-03-25 LAB — LIPASE, BLOOD: Lipase: 115 U/L — ABNORMAL HIGH (ref 11–51)

## 2024-03-25 LAB — CBC
HCT: 26.7 % — ABNORMAL LOW (ref 39.0–52.0)
Hemoglobin: 9.1 g/dL — ABNORMAL LOW (ref 13.0–17.0)
MCH: 31 pg (ref 26.0–34.0)
MCHC: 34.1 g/dL (ref 30.0–36.0)
MCV: 90.8 fL (ref 80.0–100.0)
Platelets: 235 K/uL (ref 150–400)
RBC: 2.94 MIL/uL — ABNORMAL LOW (ref 4.22–5.81)
RDW: 17.9 % — ABNORMAL HIGH (ref 11.5–15.5)
WBC: 11.5 K/uL — ABNORMAL HIGH (ref 4.0–10.5)
nRBC: 0 % (ref 0.0–0.2)

## 2024-03-25 LAB — LACTIC ACID, PLASMA: Lactic Acid, Venous: 1.9 mmol/L (ref 0.5–1.9)

## 2024-03-25 MED ORDER — SODIUM CHLORIDE 0.9 % IV SOLN
3.0000 g | Freq: Two times a day (BID) | INTRAVENOUS | Status: DC
  Administered 2024-03-25 – 2024-03-30 (×10): 3 g via INTRAVENOUS
  Filled 2024-03-25 (×10): qty 8

## 2024-03-25 MED ORDER — TAMSULOSIN HCL 0.4 MG PO CAPS
0.4000 mg | ORAL_CAPSULE | Freq: Every day | ORAL | Status: DC
  Administered 2024-03-25 – 2024-03-29 (×5): 0.4 mg via ORAL
  Filled 2024-03-25 (×5): qty 1

## 2024-03-25 MED ORDER — DICLOFENAC SODIUM 1 % EX GEL
2.0000 g | Freq: Three times a day (TID) | CUTANEOUS | Status: DC
  Administered 2024-03-25 – 2024-03-30 (×13): 2 g via TOPICAL
  Filled 2024-03-25: qty 100

## 2024-03-25 MED ORDER — SODIUM CHLORIDE 0.9 % IV SOLN: 3.0000 g | Freq: Four times a day (QID) | INTRAVENOUS | Status: DC

## 2024-03-25 NOTE — Progress Notes (Signed)
 PHARMACY NOTE:  ANTIMICROBIAL RENAL DOSAGE ADJUSTMENT  Current antimicrobial regimen includes a mismatch between antimicrobial dosage and estimated renal function.  As per policy approved by the Pharmacy & Therapeutics and Medical Executive Committees, the antimicrobial dosage will be adjusted accordingly.  Current antimicrobial dosage:  Unasyn  3g IV q6h  Indication: Intra-abdominal infection, new fever  Renal Function:  Estimated Creatinine Clearance: 26 mL/min (A) (by C-G formula based on SCr of 1.8 mg/dL (H)). []      On intermittent HD, scheduled: []      On CRRT    Antimicrobial dosage has been changed to:  Unasyn  3g IV q12h    Thank you for allowing pharmacy to be a part of this patient's care.  Marget Hench, [facility] 03/25/2024 1:50 PM

## 2024-03-25 NOTE — Progress Notes (Addendum)
" ° ° ° °  Patient Name: Luis Chen           DOB: [birth year]  MRN: [id number]      Admission Date: 03/14/2024  Attending Provider: Sebastian Toribio GAILS, MD  Primary Diagnosis: Obstructive jaundice (HCC)   Level of care: Telemetry   OVERNIGHT EVENT   Febrile, Tmax 102.6 F All other vital stable.  Not meeting sepsis criteria.  No other acute changes reported. Denies chills, SOB, cough, sputum production, sore throat, chest discomfort, palpitations, nausea, vomiting, diarrhea, urinary changes.  No recent sick visits.  Has not had prior fevers in the past 48 hrs.  WBC 8.7. s/p right I/E biliary drain placement on 03/17/24.  s/p EUS/FNA pancreatic head mass 03/24/24.    Bilateral breath sounds clear and even.  No accessory muscle use. Abdomen is soft and nondistended.  No tenderness reported.  Of note, patient was covered with five blankets and in a very warm environment, which may have affected temperature readings.    Plan: Blood cultures CBC Lactic UA Tylenol  Continue incentive spirometry  Monitor WBC and fever improved.  Hold antibiotics for now    Luis Pollio, DNP, ACNPC- [facility] [place]    "

## 2024-03-25 NOTE — Progress Notes (Signed)
 "   Progress Note   Assessment    84 year old admitted with weight loss, elevated LFTs and obstructive jaundice associated with pancreatic mass and biliary obstruction; status post internal/external percutaneous biliary drain with IR and now EUS with FNA on 03/24/2024; febrile overnight  Principal Problem:   Obstructive jaundice (HCC) Active Problems:   Anemia, chronic disease   Chronic kidney disease (CKD), stage III (moderate) (HCC)   MGUS (monoclonal gammopathy of unknown significance)   Coronary artery disease involving native coronary artery of native heart with other form of angina pectoris   Transaminitis   Pancreatic mass   Elevated bilirubin   Abnormal CT of the abdomen   Elevated liver enzymes   Recommendations   1.  Fever --with EUS and FNA differential includes pancreatitis or incidental bacteremia associated with procedure.  Also with biliary obstruction and recent stent placement and so this needs to be considered as well.  Total bilirubin is up a bit, drain appears to be functioning, trend.  Mild new leukocytosis -- begin Unasyn  per pharmacy -- Follow-up lipase --Follow blood cultures --Remove Foley catheter  2.  Pancreatic mass and biliary obstruction with elevated CA 19-9 -- Await cytology from FNA done yesterday --Monitor PTC drain output; liver panel daily  35 minutes total spent today including patient facing time, coordination of care, reviewing medical history/procedures/pertinent radiology studies, and documentation of the encounter.  Chief Complaint   Fever to 102.6 overnight This morning patient denies abdominal pain, able to eat breakfast without nausea or vomiting Was cold overnight with the fever Treated with Tylenol , fever downtrending though low-grade at 6 AM and again at 10 AM (100.9)  EUS yesterday with FNA; adequate cellularity; cytology pending  Vital signs in last 24 hours: Temp:  [100.2 F (37.9 C)-102.6 F (39.2 C)] 100.9 F (38.3  C) (01/17 1010) Pulse Rate:  [80-94] 80 (01/17 1010) Resp:  [16-18] 18 (01/17 0648) BP: (126-152)/(57-79) 152/64 (01/17 1010) SpO2:  [96 %-100 %] 100 % (01/17 1010) Last BM Date : 03/22/24 Gen: awake, alert, NAD HEENT: anicteric  CV: RRR, no mrg Pulm: CTA b/l Abd: soft, NT/ND, +BS throughout; PTC drainage catheter in place right upper abdomen, Foley catheter in place Ext: no c/c/e Neuro: nonfocal  Intake/Output from previous day: 01/16 0701 - 01/17 0700 In: 306 [I.V.:306] Out: 1900 [Urine:1050; Drains:850] Intake/Output this shift: Total I/O In: 120 [P.O.:120] Out: 150 [Drains:150]  Lab Results: Recent Labs    03/24/24 0410 03/24/24 2134 03/25/24 0334  WBC 8.7 8.8 11.5*  HGB 8.9* 9.6* 9.1*  HCT 25.1* 27.2* 26.7*  PLT 241 241 235   BMET Recent Labs    03/24/24 0410 03/24/24 2134 03/25/24 0334  NA 138 137 138  K 3.7 4.0 3.8  CL 105 104 105  CO2 23 23 25   GLUCOSE 163* 166* 96  BUN 16 17 15   CREATININE 1.91* 1.85* 1.80*  CALCIUM  9.1 9.3 9.4       Latest Ref Rng & Units 03/25/2024    3:34 AM 03/24/2024    9:34 PM 03/24/2024    4:10 AM  Hepatic Function  Total Protein 6.5 - 8.1 g/dL 6.4  6.6  6.0   Albumin  3.5 - 5.0 g/dL 3.0  3.0  2.8   AST 15 - 41 U/L 48  40  38   ALT 0 - 44 U/L 55  58  60   Alk Phosphatase 38 - 126 U/L 686  731  715   Total Bilirubin 0.0 - 1.2 mg/dL  3.9  3.5  3.3       LOS: 10 days   Gordy CHRISTELLA Starch, MD 03/25/2024, 12:12 PM See [facility], [facility], to contact our on call provider   "

## 2024-03-25 NOTE — Progress Notes (Signed)
 Mobility Specialist Progress Note:   03/25/24 1155  Mobility  Activity Stood with assistance  Level of Assistance Minimal assist, patient does 75% or more  Assistive Device Front wheel walker  Range of Motion/Exercises Active  Activity Response Tolerated well  Mobility Referral Yes  Mobility visit 1 Mobility  Mobility Specialist Start Time (ACUTE ONLY) 1054  Mobility Specialist Stop Time (ACUTE ONLY) 1112  Mobility Specialist Time Calculation (min) (ACUTE ONLY) 18 min   Pt was received in bed and agreed to mobility. Pt stated feeling weak from laying in bed, opted for bedside exercises: Standing Exercises with UE support:  1) Sit to Stands: 1 x 3  2) Marching: 3 x 10    Pt also stated soreness in knees during session. Returned to bed with all needs met. Bed alarm on.  [employer] - Mobility Specialist

## 2024-03-25 NOTE — Progress Notes (Signed)
" °   03/24/24 2037  Assess: MEWS Score  Temp (!) 102.6 F (39.2 C)  BP (!) 147/64  MAP (mmHg) 89  Pulse Rate 94  Resp 16  SpO2 100 %  O2 Device Room Air  Assess: MEWS Score  MEWS Temp 2  MEWS Systolic 0  MEWS Pulse 0  MEWS RR 0  MEWS LOC 0  MEWS Score 2  MEWS Score Color Yellow  Assess: if the MEWS score is Yellow or Red  Were vital signs accurate and taken at a resting state? Yes  Does the patient meet 2 or more of the SIRS criteria? Yes  Does the patient have a confirmed or suspected source of infection? No  MEWS guidelines implemented  Yes, yellow  Treat  MEWS Interventions Considered administering scheduled or prn medications/treatments as ordered  Take Vital Signs  Increase Vital Sign Frequency  Yellow: Q2hr x1, continue Q4hrs until patient remains green for 12hrs  Escalate  MEWS: Escalate Yellow: Discuss with charge nurse and consider notifying provider and/or RRT  Provider Notification  Provider Name/Title Lavanda Horns, NP  Assess: SIRS CRITERIA  SIRS Temperature  1  SIRS Respirations  0  SIRS Pulse 1  SIRS WBC 0  SIRS Score Sum  2    "

## 2024-03-25 NOTE — Progress Notes (Signed)
 "   Referring Physician(s): Perry,J  Supervising Physician: Jennefer Rover  Patient Status:  [facility] - In-pt  Chief Complaint:  Biliary obstruction, pancreatic mass; s/p I/E biliary drain placement 03/17/24   Subjective: Patient noted to have temperature elevations last night, Tmax 102.6 (2040); latest 100.9; denies headache, respiratory issues, worsening abdominal pain, nausea, vomiting or chills; biliary drain intact; Foley catheter in place with tea colored urine   Allergies: Ace inhibitors and Entresto  [sacubitril-valsartan]  Medications: Prior to Admission medications  Medication Sig Start Date End Date Taking? Authorizing Provider  aspirin  81 MG EC tablet Take 1 tablet (81 mg total) by mouth daily. 07/28/19  Yes Meng, Hao, PA  carvedilol  (COREG ) 6.25 MG tablet Take 0.5 tablets (3.125 mg total) by mouth 2 (two) times daily with a meal. 05/28/20  Yes [name], MD  Cholecalciferol (VITAMIN D3) 1000 units CAPS Take 1,000 Units by mouth daily.   Yes [provider]  diclofenac  Sodium (VOLTAREN ) 1 % GEL 2 g 4 (four) times daily as needed (for pain- left knee and any other affected area). 03/08/23  Yes [provider]  ezetimibe  (ZETIA ) 10 MG tablet Take 10 mg by mouth daily.   Yes [provider]  Ferrous Gluconate  324 (37.5 Fe) MG TABS TAKE 1 TABLET BY MOUTH TWICE DAILY WITH MEALS Patient taking differently: Take 324 mg by mouth See admin instructions. Take 324 mg by mouth with food on Mon/Wed/Fri 02/08/18  Yes Shadad, Firas N, MD  KETOTIFEN FUMARATE OP Place 1 drop into both eyes See admin instructions. Ketotifen 0.025% eye drops - Instill 1 drop into both eyes one to two times a day   Yes [provider]  latanoprost  (XALATAN ) 0.005 % ophthalmic solution Place 1 drop into both eyes at bedtime. 01/22/17  Yes [provider]  losartan  (COZAAR ) 50 MG tablet Take 50 mg by mouth 2 (two) times daily.   Yes [provider]  Nutritional  Supplements (SUPLENA/CARB STEADY) LIQD Take 237 mLs by mouth See admin instructions. Drink 237 ml's of Vanilla Suplena by mouth three times a day   Yes [provider]  rosuvastatin  (CRESTOR ) 40 MG tablet TAKE 1 TABLET BY MOUTH ONCE DAILY AT  6PM Patient taking differently: Take 40 mg by mouth at bedtime. 11/07/18  Yes [name], MD  traZODone  (DESYREL ) 150 MG tablet Take 150 mg by mouth at bedtime as needed for sleep.    Yes [provider]  trolamine salicylate (ASPERCREME) 10 % cream Apply 1 Application topically as needed (for left knee pain and/or other affected areas).   Yes [provider]  ezetimibe  (ZETIA ) 10 MG tablet Take 1 tablet (10 mg total) by mouth daily. Patient not taking: Reported on 03/15/2024 12/05/18 03/15/24  Rolan Ezra RAMAN, MD  oxyCODONE -acetaminophen  (PERCOCET/ROXICET) 5-325 MG tablet Take 1 tablet by mouth every 6 (six) hours as needed for severe pain (pain score 7-10). Patient not taking: Reported on 03/15/2024 03/09/23   Henderly, Britni A, PA-C  spironolactone  (ALDACTONE ) 25 MG tablet Take 1 tablet (25 mg total) by mouth daily. Patient not taking: Reported on 03/15/2024 12/06/18 03/15/24  Rolan Ezra RAMAN, MD     Vital Signs: BP (!) 152/64 (BP Location: Right Arm)   Pulse 80   Temp (!) 100.9 F (38.3 C) (Axillary)   Resp 18   Ht 5' 10 (1.778 m)   Wt 135 lb 2.3 oz (61.3 kg)   SpO2 100%   BMI 19.39 kg/m   Physical Exam:  Patient awake, answering questions okay.  Biliary drain intact, output 850 cc yesterday, 150 cc today ,insertion site okay, drain flushed without difficulty or extravasation at skin site  Imaging: No results found.  Labs:  CBC: Recent Labs    03/23/24 0422 03/23/24 1735 03/24/24 0410 03/24/24 2134 03/25/24 0334  WBC 9.5  --  8.7 8.8 11.5*  HGB 6.8* 9.5* 8.9* 9.6* 9.1*  HCT 19.1* 26.5* 25.1* 27.2* 26.7*  PLT 252  --  241 241 235    COAGS: Recent Labs    03/14/24 1730 03/16/24 0447  INR 1.0 1.1     BMP: Recent Labs    03/23/24 0422 03/24/24 0410 03/24/24 2134 03/25/24 0334  NA 137 138 137 138  K 3.7 3.7 4.0 3.8  CL 105 105 104 105  CO2 25 23 23 25   GLUCOSE 105* 163* 166* 96  BUN 16 16 17 15   CALCIUM  9.1 9.1 9.3 9.4  CREATININE 1.94* 1.91* 1.85* 1.80*  GFRNONAA 33* 34* 35* 36*    LIVER FUNCTION TESTS: Recent Labs    03/23/24 0422 03/24/24 0410 03/24/24 2134 03/25/24 0334  BILITOT 2.9* 3.3* 3.5* 3.9*  AST 35 38 40 48*  ALT 70* 60* 58* 55*  ALKPHOS 796* 715* 731* 686*  PROT 6.0* 6.0* 6.6 6.4*  ALBUMIN  2.9* 2.8* 3.0* 3.0*    Assessment and Plan: 84 y.o. male with past medical history significant for anemia, chronic kidney disease, coronary artery disease with prior CABG, cardiomyopathy, MGUS, hypertension, CHF, glaucoma, hyperlipidemia, osteoarthritis who was admitted to [facility] on 1/7 with weight loss, lightheadedness, low blood pressure, elevated liver function tests, obstructive jaundice with associated pancreatic head mass, biliary and pancreatic duct dilatation concerning for pancreatic adenocarcinoma, mild gallbladder distention and mild gallbladder wall thickening; s/p right I/E biliary drain placement on 03/17/24 (10 fr to bag); bile cytology neg; s/p EUS/FNA panc head mass 1/16- path pend; WBC 11.5, hgb 9.1(9.6), creat 1.8(1.85), t bili 3.9(3.5)(2.9), other LFT's sl better; latest blood cx neg to date; new bile cx pend; cont with drain irrigation, lab checks; if t bili cont to climb consider NONCONTRAST  CT .   Electronically Signed: D. Franky Rakers, PA-C 03/25/2024, 10:30 AM   I spent a total of 15 Minutes at the the patient's bedside AND on the patient's hospital floor or unit, greater than 50% of which was counseling/coordinating care for biliary drain    Patient ID: Luis Chen, male   DOB: Jan 19, 1941, 84 y.o.   MRN: [id number]  "

## 2024-03-25 NOTE — Progress Notes (Signed)
 " PROGRESS NOTE    Luis Chen  FMW:[id number] DOB: Jan 27, 1941 DOA: 03/14/2024 PCP: [name], MD    Chief Complaint  Patient presents with   Hypotension    Brief Narrative:  Luis Chen is a 84 y.o. male with PMH of CKD stage III, CAD, MGUS, HLD who presented with lightheadedness and low blood pressure, and weight loss over the last few weeks. Blood work in the ED showed elevated LFTs, significantly elevated alkaline phosphatase and bilirubin. CT abdomen/pelvis showed concern for pancreatic mass , labs showed AKI.  Gastroenterology and nephrology IR has been consulted.   S/p ERCP but unable to pass guidewire, now s/p perc biliary drain by IR.      Assessment & Plan:   Principal Problem:   Obstructive jaundice (HCC) Active Problems:   Anemia, chronic disease   Chronic kidney disease (CKD), stage III (moderate) (HCC)   MGUS (monoclonal gammopathy of unknown significance)   Coronary artery disease involving native coronary artery of native heart with other form of angina pectoris   Transaminitis   Pancreatic mass   Elevated bilirubin   Abnormal CT of the abdomen   Elevated liver enzymes  #1 obstructive jaundice secondary to pancreatic head mass -MRCP done with concerns for pancreatic head mass associated with severe dorsal pancreatic duct and biliary dilatation. - Patient seen in consultation by GI and underwent ERCP however unable to pass guidewire and IR consulted for percutaneous drain placement which was done 03/17/2024. -Percutaneous drain with output of 850 cc over the past 24 hours. - CA 19-9 noted to be elevated at 2010 - Cytology done with no malignant cells identified. - LFTs trending down. - Patient was on IV Zosyn  which was discontinued per GI recommendations. - Patient being followed by GI and patient s/p EUS and FNA, 03/24/2024 with biopsy pending.   -Per GI.  2.  AKI on CKD stage IIIa - Baseline creatinine noted at 1.3-1.5 in 2024 and October 2025. -  CT done with no hydronephrosis and bladder thickening possible due to bladder outlet obstruction. - AKI felt likely secondary to volume depletion and bladder outlet obstruction. - Foley catheter in place. -Urine output of 1.050 L over the past 24 hours. - Patient was hydrated with IV fluids with renal function trending down creatinine down to 1.80 today. - Status post transfusion 2 units PRBCs. - Nephrology was following and feel patient will need urology outpatient follow-up or TOV prior to discharge. - Patient noted overnight to develop fevers and as such will DC Foley catheter and undergo voiding trial today.  3.  Metabolic acidosis -Resolved.  4.  MGUS with normocytic anemia - Patient with no overt bleeding. - Status posttransfusion 2 units PRBCs with hemoglobin at 9.1 from 6.8 (03/23/2024).SABRA -GI following. - Transfuse for hemoglobin < 7.  5.  CAD -Antiplatelets on hold and resume once cleared by GI and post procedures.  6.  Glaucoma - Eyedrops.  7.  Fever -Patient noted overnight to have a temp of 102.6.Glucose - Leukocytosis trending up. - Patient with EUS with instrumentation and biopsies taken 03/24/2024. - Blood cultures ordered and pending. -Urinalysis obtained with negative leukocytes, nitrite negative, 0-5 WBCs. - Discontinue Foley catheter - Lipase levels obtained per GI noted to be elevated at 115 however patient with no abdominal pain and tolerating oral intake. - Patient started empirically on IV Unasyn  per GI.     DVT prophylaxis: Heparin  Code Status: Full Family Communication: Updated patient, no family at bedside.  Disposition: Likely home when clinically improved and cleared by GI.  Status is: Inpatient Remains inpatient appropriate because: Severity of illness   Consultants:  Gastroenterology: Dr. Abran 03/16/2024 Nephrology: Dr. Vassie 03/16/2024 Interventional radiology: Dr.Mugweru 03/17/2018  Procedures:  CT abdomen and pelvis 03/14/2024 MRCP  03/15/2024 ERCP 03/17/2024 by Dr. Abran Percutaneous transhepatic biliary tube placement anterograde cholangiogram by IR: Dr.Mugweru 03/17/2024 Transfusion 2 units PRBCs 03/23/2024 EUS per GI: Dr. Rollin 03/24/2024  Antimicrobials:  Anti-infectives (From admission, onward)    Start     Dose/Rate Route Frequency Ordered Stop   03/25/24 1400  Ampicillin -Sulbactam (UNASYN ) 3 g in sodium chloride  0.9 % 100 mL IVPB  Status:  Discontinued        3 g 200 mL/hr over 30 Minutes Intravenous Every 6 hours 03/25/24 1232 03/25/24 1350   03/25/24 1400  Ampicillin -Sulbactam (UNASYN ) 3 g in sodium chloride  0.9 % 100 mL IVPB        3 g 200 mL/hr over 30 Minutes Intravenous Every 12 hours 03/25/24 1350     03/17/24 1615  cefTRIAXone  (ROCEPHIN ) 2 g in sodium chloride  0.9 % 100 mL IVPB        2 g 200 mL/hr over 30 Minutes Intravenous  Once 03/17/24 1517 03/20/24 1256   03/15/24 2000  piperacillin -tazobactam (ZOSYN ) IVPB 2.25 g  Status:  Discontinued        2.25 g 100 mL/hr over 30 Minutes Intravenous Every 8 hours 03/15/24 1555 03/21/24 1129   03/15/24 0800  piperacillin -tazobactam (ZOSYN ) IVPB 3.375 g  Status:  Discontinued        3.375 g 12.5 mL/hr over 240 Minutes Intravenous Every 12 hours 03/15/24 0520 03/15/24 1555   03/15/24 0600  piperacillin -tazobactam (ZOSYN ) IVPB 2.25 g  Status:  Discontinued       Placed in Followed by Linked Group   2.25 g 100 mL/hr over 30 Minutes Intravenous Every 6 hours 03/14/24 2217 03/15/24 0519   03/14/24 2230  piperacillin -tazobactam (ZOSYN ) IVPB 3.375 g       Placed in Followed by Linked Group   3.375 g 100 mL/hr over 30 Minutes Intravenous  Once 03/14/24 2217 03/15/24 9394         Subjective: Patient sleeping but easily arousable.  Denies any chest pain or shortness of breath.  Denies any abdominal pain.  Patient noted to have fever overnight of 102.6.  Patient pancultured.  Objective: Vitals:   03/25/24 0220 03/25/24 0648 03/25/24 1010 03/25/24 1339  BP: (!)  135/57 137/66 (!) 152/64 (!) 143/64  Pulse: 85 82 80 91  Resp: 18 18  18   Temp: (!) 100.5 F (38.1 C) (!) 100.4 F (38 C) (!) 100.9 F (38.3 C) 99.8 F (37.7 C)  TempSrc: Oral Oral Axillary Oral  SpO2: 100% 100% 100% 90%  Weight:      Height:        Intake/Output Summary (Last 24 hours) at 03/25/2024 1352 Last data filed at 03/25/2024 1300 Gross per 24 hour  Intake 120 ml  Output 2050 ml  Net -1930 ml   Filed Weights   03/21/24 0432 03/23/24 0517 03/24/24 0504  Weight: 67.5 kg 58.7 kg 61.3 kg    Examination:  General exam: NAD. Respiratory system: Lungs clear to auscultation bilaterally.  No wheezes, no crackles, no rhonchi.  Fair air movement.  Speaking in full sentences.  Cardiovascular system: RRR no murmurs rubs or gallops.  No JVD.  No lower extremity edema.  Gastrointestinal system: Abdomen is soft, nontender, nondistended, positive bowel sounds.  No rebound.  No guarding.  Percutaneous drain in place on the right with bilious drainage noted.  Central nervous system: Alert and oriented.  Moving extremities spontaneously.  No focal neurological deficits. Extremities: Symmetric 5 x 5 power. Skin: No rashes, lesions or ulcers Psychiatry: Judgement and insight appear normal. Mood & affect appropriate.     Data Reviewed: I have personally reviewed following labs and imaging studies  CBC: Recent Labs  Lab 03/22/24 1303 03/23/24 0422 03/23/24 1735 03/24/24 0410 03/24/24 2134 03/25/24 0334  WBC 8.4 9.5  --  8.7 8.8 11.5*  NEUTROABS 5.4 6.1  --   --  6.7  --   HGB 7.9* 6.8* 9.5* 8.9* 9.6* 9.1*  HCT 22.8* 19.1* 26.5* 25.1* 27.2* 26.7*  MCV 90.1 88.8  --  87.5 88.9 90.8  PLT 264 252  --  241 241 235    Basic Metabolic Panel: Recent Labs  Lab 03/19/24 0415 03/20/24 0355 03/22/24 1303 03/23/24 0422 03/24/24 0410 03/24/24 2134 03/25/24 0334  NA 138 140 138 137 138 137 138  K 3.6 3.4* 3.9 3.7 3.7 4.0 3.8  CL 100 103 103 105 105 104 105  CO2 29 29 26 25 23  23 25   GLUCOSE 101* 98 99 105* 163* 166* 96  BUN 23 21 18 16 16 17 15   CREATININE 2.93* 2.65* 2.03* 1.94* 1.91* 1.85* 1.80*  CALCIUM  8.6* 8.7* 9.4 9.1 9.1 9.3 9.4  MG  --   --  1.7  --   --   --   --   PHOS 2.5 3.0 2.9  --   --   --   --     GFR: Estimated Creatinine Clearance: 26 mL/min (A) (by C-G formula based on SCr of 1.8 mg/dL (H)).  Liver Function Tests: Recent Labs  Lab 03/22/24 1303 03/23/24 0422 03/24/24 0410 03/24/24 2134 03/25/24 0334  AST 42* 35 38 40 48*  ALT 88* 70* 60* 58* 55*  ALKPHOS 940* 796* 715* 731* 686*  BILITOT 2.9* 2.9* 3.3* 3.5* 3.9*  PROT 6.6 6.0* 6.0* 6.6 6.4*  ALBUMIN  3.1* 2.9* 2.8* 3.0* 3.0*    CBG: No results for input(s): GLUCAP in the last 168 hours.   Recent Results (from the past 240 hours)  Aerobic/Anaerobic Culture w Gram Stain (surgical/deep wound)     Status: None   Collection Time: 03/17/24  5:58 PM   Specimen: BILE  Result Value Ref Range Status   Specimen Description   Final    BILE Performed at [facility], [address], [place], [place] [address]    Special Requests   Final    NONE Performed at [facility], [address] [place], [place] [address]    Gram Stain NO WBC SEEN NO ORGANISMS SEEN   Final   Culture   Final    No growth aerobically or anaerobically. Performed at [facility], [address], [place], [place] [address]    Report Status 03/22/2024 FINAL  Final  Culture, blood (x 2)     Status: None (Preliminary result)   Collection Time: 03/24/24  9:34 PM   Specimen: BLOOD RIGHT ARM  Result Value Ref Range Status   Specimen Description BLOOD RIGHT ARM  Final   Special Requests   Final    BOTTLES DRAWN AEROBIC ONLY Blood Culture adequate volume   Culture   Final    NO GROWTH < 12 HOURS Performed at [facility], [address] [place], [place] [address]    Report Status PENDING  Incomplete  Culture, blood (x 2)     Status: None (Preliminary  result)   Collection Time: 03/24/24  9:34 PM   Specimen: BLOOD LEFT ARM  Result Value Ref Range Status   Specimen Description   Final    BLOOD LEFT ARM Performed at [facility], [address] [place], [place] [address]    Special Requests   Final    BOTTLES DRAWN AEROBIC AND ANAEROBIC Blood Culture adequate volume Performed at [facility], [address] [place], [place] [address]    Culture   Final    NO GROWTH < 12 HOURS Performed at [facility], [address] [place], [place] [address]    Report Status PENDING  Incomplete         Radiology Studies: No results found.      Scheduled Meds:  Chlorhexidine  Gluconate Cloth  6 each Topical Daily   heparin  injection (subcutaneous)  5,000 Units Subcutaneous Q8H   latanoprost   1 drop Both Eyes Daily   senna  1 tablet Oral BID   sodium chloride  flush  5 mL Intracatheter Q8H   Continuous Infusions:  ampicillin -sulbactam (UNASYN ) IV       LOS: 10 days    Time spent: 40 minutes    Toribio Hummer, MD [facility]   To contact the attending provider between 7A-7P or the covering provider during after hours 7P-7A, please log into the web site [email or web address] and access using universal [place] password for that web site. If you do not have the password, please call the hospital operator.  03/25/2024, 1:52 PM    "

## 2024-03-26 ENCOUNTER — Encounter (HOSPITAL_COMMUNITY): Payer: Self-pay | Admitting: Gastroenterology

## 2024-03-26 DIAGNOSIS — K831 Obstruction of bile duct: Secondary | ICD-10-CM | POA: Diagnosis not present

## 2024-03-26 DIAGNOSIS — M25561 Pain in right knee: Secondary | ICD-10-CM | POA: Diagnosis not present

## 2024-03-26 DIAGNOSIS — K8689 Other specified diseases of pancreas: Secondary | ICD-10-CM | POA: Diagnosis not present

## 2024-03-26 DIAGNOSIS — M25562 Pain in left knee: Secondary | ICD-10-CM | POA: Diagnosis not present

## 2024-03-26 DIAGNOSIS — R748 Abnormal levels of other serum enzymes: Secondary | ICD-10-CM | POA: Diagnosis not present

## 2024-03-26 DIAGNOSIS — I25118 Atherosclerotic heart disease of native coronary artery with other forms of angina pectoris: Secondary | ICD-10-CM | POA: Diagnosis not present

## 2024-03-26 DIAGNOSIS — R7401 Elevation of levels of liver transaminase levels: Secondary | ICD-10-CM | POA: Diagnosis not present

## 2024-03-26 DIAGNOSIS — M25462 Effusion, left knee: Secondary | ICD-10-CM | POA: Diagnosis not present

## 2024-03-26 DIAGNOSIS — D472 Monoclonal gammopathy: Secondary | ICD-10-CM | POA: Diagnosis not present

## 2024-03-26 DIAGNOSIS — D638 Anemia in other chronic diseases classified elsewhere: Secondary | ICD-10-CM | POA: Diagnosis not present

## 2024-03-26 DIAGNOSIS — M25461 Effusion, right knee: Secondary | ICD-10-CM

## 2024-03-26 DIAGNOSIS — N1831 Chronic kidney disease, stage 3a: Secondary | ICD-10-CM | POA: Diagnosis not present

## 2024-03-26 LAB — CBC
HCT: 26.6 % — ABNORMAL LOW (ref 39.0–52.0)
Hemoglobin: 9.2 g/dL — ABNORMAL LOW (ref 13.0–17.0)
MCH: 30.9 pg (ref 26.0–34.0)
MCHC: 34.6 g/dL (ref 30.0–36.0)
MCV: 89.3 fL (ref 80.0–100.0)
Platelets: 251 K/uL (ref 150–400)
RBC: 2.98 MIL/uL — ABNORMAL LOW (ref 4.22–5.81)
RDW: 17.4 % — ABNORMAL HIGH (ref 11.5–15.5)
WBC: 10.4 K/uL (ref 4.0–10.5)
nRBC: 0 % (ref 0.0–0.2)

## 2024-03-26 LAB — SYNOVIAL CELL COUNT + DIFF, W/ CRYSTALS
Crystals, Fluid: NONE SEEN
Crystals, Fluid: NONE SEEN
Eosinophils-Synovial: 0 % (ref 0–1)
Lymphocytes-Synovial Fld: 0 % (ref 0–20)
Lymphocytes-Synovial Fld: 1 % (ref 0–20)
Monocyte-Macrophage-Synovial Fluid: 20 % — ABNORMAL LOW (ref 50–90)
Monocyte-Macrophage-Synovial Fluid: 3 % — ABNORMAL LOW (ref 50–90)
Neutrophil, Synovial: 80 % — ABNORMAL HIGH (ref 0–25)
Neutrophil, Synovial: 96 % — ABNORMAL HIGH (ref 0–25)
WBC, Synovial: 19740 /mm3 — ABNORMAL HIGH (ref 0–200)
WBC, Synovial: 530 /mm3 — ABNORMAL HIGH (ref 0–200)

## 2024-03-26 LAB — COMPREHENSIVE METABOLIC PANEL WITH GFR
ALT: 44 U/L (ref 0–44)
AST: 41 U/L (ref 15–41)
Albumin: 2.9 g/dL — ABNORMAL LOW (ref 3.5–5.0)
Alkaline Phosphatase: 613 U/L — ABNORMAL HIGH (ref 38–126)
Anion gap: 10 (ref 5–15)
BUN: 17 mg/dL (ref 8–23)
CO2: 24 mmol/L (ref 22–32)
Calcium: 9.5 mg/dL (ref 8.9–10.3)
Chloride: 101 mmol/L (ref 98–111)
Creatinine, Ser: 1.68 mg/dL — ABNORMAL HIGH (ref 0.61–1.24)
GFR, Estimated: 40 mL/min — ABNORMAL LOW
Glucose, Bld: 135 mg/dL — ABNORMAL HIGH (ref 70–99)
Potassium: 3.5 mmol/L (ref 3.5–5.1)
Sodium: 135 mmol/L (ref 135–145)
Total Bilirubin: 3.6 mg/dL — ABNORMAL HIGH (ref 0.0–1.2)
Total Protein: 6.4 g/dL — ABNORMAL LOW (ref 6.5–8.1)

## 2024-03-26 LAB — LIPASE, BLOOD: Lipase: 88 U/L — ABNORMAL HIGH (ref 11–51)

## 2024-03-26 NOTE — Consult Note (Cosign Needed Addendum)
 Reason for Consult: Bilateral knee pain and swelling Referring Physician: Sebastian, daniel   Harrell Niehoff Luis Chen is an 84 y.o. male.  HPI:  Patient is an 84 year old male who we are in consult to see for bilateral knee swelling and pain.  Presented to the ER with lightheadedness and hypotension and also weight loss over the last few weeks.  Blood work in the ED showed LFTs to be elevated significant elevated alkaline phosphatase and bilirubin.  Past medical history for chronic kidney disease stage III coronary artery disease, systolic heart failure, bilateral knee osteoarthritis and pseudogout left knee.  History of left knee arthroscopy 2017 for septic knee however all labs were negative for any organisms both preoperative and intraoperative.  Patient was last seen in our office in January 2025 for left pain knee pain. Noticed swelling in the left knee today . Notes significant pain left knee and mild pain right knee. Denies any injury to either knee. Reports painful ambulation due to left knee.    Past Medical History:  Diagnosis Date   Anemia, chronic disease 07/01/2015   Angina decubitus    Benign essential hypertension 10/31/2008   Qualifier: Diagnosis of  By: Lavona, MD, CODY Agent     CAD (coronary artery disease)    non obstructive. Left main normal. LAd proximal long 25% stenosis, termingating as focal 50% prox to mid lesion. First & second diag were small, normal. Circumflex in proximal av groove had luminal irregularities.. Was large mid obtuse marg which was branching, scattered luminal irregul. Infer branch did supply some septal perforators. Was PDA,small & normal.  < 1.41mm. There was prox 95% stenosis   CARDIOMYOPATHY 11/27/2008   CKD (chronic kidney disease)    Hyperlipidemia    transient history, controlled on diet   Hypertension    Mixed hyperlipidemia 10/30/2008   Qualifier: Diagnosis of  By: Leana, CNA, [name]     Non-traumatic rhabdomyolysis 07/03/2015   Polyarthropathy  07/01/2015   Primary osteoarthritis of knees, bilateral 12/10/2014   Pyogenic arthritis of left knee joint (HCC)    Swelling of left knee joint 07/01/2015   Systolic HF (heart failure) (HCC) 02/08/2018    Past Surgical History:  Procedure Laterality Date   CORONARY ARTERY BYPASS GRAFT N/A 02/17/2018   Procedure: CORONARY ARTERY BYPASS GRAFTING (CABG);  Surgeon: Lucas Dorise POUR, MD;  Location: [facility] OR;  Service: Open Heart Surgery;  Laterality: N/A;  Times 3 using endoscopically harvested right saphenous vein.     ERCP N/A 03/17/2024   Procedure: ERCP, WITH INTERVENTION IF INDICATED;  Surgeon: Luis Norleen SAILOR, MD;  Location: [facility] ENDOSCOPY;  Service: Gastroenterology;  Laterality: N/A;   IR INT EXT BILIARY DRAIN WITH CHOLANGIOGRAM  03/17/2024   KNEE ARTHROSCOPY Left 07/01/2015   Procedure: Irrigation and debridement left knee arthroscopy;  Surgeon: Luis JONETTA Chancy, MD;  Location: [facility] OR;  Service: Orthopedics;  Laterality: Left;   RIGHT/LEFT HEART CATH AND CORONARY ANGIOGRAPHY N/A 02/08/2018   Procedure: RIGHT/LEFT HEART CATH AND CORONARY ANGIOGRAPHY;  Surgeon: Dann Candyce RAMAN, MD;  Location: [facility] INVASIVE CV LAB;  Service: Cardiovascular;  Laterality: N/A;   TEE WITHOUT CARDIOVERSION N/A 02/17/2018   Procedure: TRANSESOPHAGEAL ECHOCARDIOGRAM (TEE);  Surgeon: Lucas Dorise POUR, MD;  Location: [facility] OR;  Service: Open Heart Surgery;  Laterality: N/A;    Family History  Problem Relation Age of Onset   Diabetes Mother    Arthritis Mother    Heart disease Mother    Hyperlipidemia Mother    Hypertension Mother  Heart attack Mother 82   Heart disease Brother        had in his 78s. Had CABG x2   Hypothyroidism Daughter    Stroke Daughter     Social History:  reports that he has never smoked. He has never used smokeless tobacco. He reports current alcohol use. He reports that he does not use drugs.  Allergies: Allergies[1]  Medications: I have reviewed the patient's current medications.  Results for  orders placed or performed during the hospital encounter of 03/14/24 (from the past 48 hours)  Culture, blood (x 2)     Status: None (Preliminary result)   Collection Time: 03/24/24  9:34 PM   Specimen: BLOOD RIGHT ARM  Result Value Ref Range   Specimen Description BLOOD RIGHT ARM    Special Requests      BOTTLES DRAWN AEROBIC ONLY Blood Culture adequate volume   Culture      NO GROWTH 1 DAY Performed at [facility], [address] [place], [place] [address]    Report Status PENDING   Culture, blood (x 2)     Status: None (Preliminary result)   Collection Time: 03/24/24  9:34 PM   Specimen: BLOOD LEFT ARM  Result Value Ref Range   Specimen Description      BLOOD LEFT ARM Performed at [facility], [address], [place], [place] [address]    Special Requests      BOTTLES DRAWN AEROBIC AND ANAEROBIC Blood Culture adequate volume Performed at [facility], [address], [place], [place] [address]    Culture      NO GROWTH 1 DAY Performed at [facility], [address] [place], [place] [address]    Report Status PENDING   CBC with Differential     Status: Abnormal   Collection Time: 03/24/24  9:34 PM  Result Value Ref Range   WBC 8.8 4.0 - 10.5 K/uL   RBC 3.06 (L) 4.22 - 5.81 MIL/uL   Hemoglobin 9.6 (L) 13.0 - 17.0 g/dL   HCT 72.7 (L) 60.9 - 47.9 %   MCV 88.9 80.0 - 100.0 fL   MCH 31.4 26.0 - 34.0 pg   MCHC 35.3 30.0 - 36.0 g/dL   RDW 82.1 (H) 88.4 - 84.4 %   Platelets 241 150 - 400 K/uL   nRBC 0.0 0.0 - 0.2 %   Neutrophils Relative % 76 %   Neutro Abs 6.7 1.7 - 7.7 K/uL   Lymphocytes Relative 11 %   Lymphs Abs 1.0 0.7 - 4.0 K/uL   Monocytes Relative 11 %   Monocytes Absolute 1.0 0.1 - 1.0 K/uL   Eosinophils Relative 2 %   Eosinophils Absolute 0.2 0.0 - 0.5 K/uL   Basophils Relative 0 %   Basophils Absolute 0.0 0.0 - 0.1 K/uL   Immature Granulocytes 0 %   Abs Immature Granulocytes 0.03 0.00 - 0.07 K/uL     Comment: Performed at [facility], [address] [place], [place] [address]  Comprehensive metabolic panel     Status: Abnormal   Collection Time: 03/24/24  9:34 PM  Result Value Ref Range   Sodium 137 135 - 145 mmol/L   Potassium 4.0 3.5 - 5.1 mmol/L   Chloride 104 98 - 111 mmol/L   CO2 23 22 - 32 mmol/L   Glucose, Bld 166 (H) 70 - 99 mg/dL    Comment: Glucose reference range applies only to samples taken  after fasting for at least 8 hours.   BUN 17 8 - 23 mg/dL   Creatinine, Ser 8.14 (H) 0.61 - 1.24 mg/dL   Calcium  9.3 8.9 - 10.3 mg/dL   Total Protein 6.6 6.5 - 8.1 g/dL   Albumin  3.0 (L) 3.5 - 5.0 g/dL   AST 40 15 - 41 U/L   ALT 58 (H) 0 - 44 U/L   Alkaline Phosphatase 731 (H) 38 - 126 U/L   Total Bilirubin 3.5 (H) 0.0 - 1.2 mg/dL   GFR, Estimated 35 (L) >60 mL/min    Comment: (NOTE) Calculated using the CKD-EPI Creatinine Equation (2021)    Anion gap 10 5 - 15    Comment: Performed at [facility], [address] [place], [place] [address]  Lactic acid, plasma     Status: None   Collection Time: 03/24/24  9:34 PM  Result Value Ref Range   Lactic Acid, Venous 1.5 0.5 - 1.9 mmol/L    Comment: Performed at [facility], [address] [place], [place] [address]  Urinalysis, Complete w Microscopic -Urine, Clean Catch     Status: Abnormal   Collection Time: 03/24/24  9:51 PM  Result Value Ref Range   Color, Urine AMBER (A) YELLOW    Comment: BIOCHEMICALS MAY BE AFFECTED BY COLOR   APPearance CLEAR CLEAR   Specific Gravity, Urine 1.014 1.005 - 1.030   pH 5.0 5.0 - 8.0   Glucose, UA 50 (A) NEGATIVE mg/dL   Hgb urine dipstick MODERATE (A) NEGATIVE   Bilirubin Urine NEGATIVE NEGATIVE   Ketones, ur NEGATIVE NEGATIVE mg/dL   Protein, ur 899 (A) NEGATIVE mg/dL   Nitrite NEGATIVE NEGATIVE   Leukocytes,Ua NEGATIVE NEGATIVE   RBC / HPF 0-5 0 - 5 RBC/hpf   WBC, UA 0-5 0 - 5 WBC/hpf   Bacteria, UA RARE (A) NONE SEEN    Squamous Epithelial / HPF 0-5 0 - 5 /HPF   Mucus PRESENT     Comment: Performed at [facility], [address] [place], [place] [address]  Lactic acid, plasma     Status: None   Collection Time: 03/24/24 11:13 PM  Result Value Ref Range   Lactic Acid, Venous 1.9 0.5 - 1.9 mmol/L    Comment: Performed at [facility], [address], [place], [place] [address]  Comprehensive metabolic panel     Status: Abnormal   Collection Time: 03/25/24  3:34 AM  Result Value Ref Range   Sodium 138 135 - 145 mmol/L   Potassium 3.8 3.5 - 5.1 mmol/L   Chloride 105 98 - 111 mmol/L   CO2 25 22 - 32 mmol/L   Glucose, Bld 96 70 - 99 mg/dL    Comment: Glucose reference range applies only to samples taken after fasting for at least 8 hours.   BUN 15 8 - 23 mg/dL   Creatinine, Ser 8.19 (H) 0.61 - 1.24 mg/dL   Calcium  9.4 8.9 - 10.3 mg/dL   Total Protein 6.4 (L) 6.5 - 8.1 g/dL   Albumin  3.0 (L) 3.5 - 5.0 g/dL   AST 48 (H) 15 - 41 U/L   ALT 55 (H) 0 - 44 U/L   Alkaline Phosphatase 686 (H) 38 - 126 U/L   Total Bilirubin 3.9 (H) 0.0 - 1.2 mg/dL   GFR, Estimated 36 (L) >60 mL/min    Comment: (NOTE) Calculated using the CKD-EPI Creatinine Equation (2021)    Anion gap 9 5 -  15    Comment: Performed at [facility], [address], [place], [place] [address]  CBC     Status: Abnormal   Collection Time: 03/25/24  3:34 AM  Result Value Ref Range   WBC 11.5 (H) 4.0 - 10.5 K/uL   RBC 2.94 (L) 4.22 - 5.81 MIL/uL   Hemoglobin 9.1 (L) 13.0 - 17.0 g/dL   HCT 73.2 (L) 60.9 - 47.9 %   MCV 90.8 80.0 - 100.0 fL   MCH 31.0 26.0 - 34.0 pg   MCHC 34.1 30.0 - 36.0 g/dL   RDW 82.0 (H) 88.4 - 84.4 %   Platelets 235 150 - 400 K/uL   nRBC 0.0 0.0 - 0.2 %    Comment: Performed at [facility], [address], [place], [place] [address]  Lipase, blood     Status: Abnormal   Collection Time: 03/25/24  3:34 AM  Result Value Ref Range   Lipase 115  (H) 11 - 51 U/L    Comment: Performed at [facility], [address], [place], [place] [address]  Body fluid culture w Gram Stain     Status: None (Preliminary result)   Collection Time: 03/25/24  9:49 AM   Specimen: BILE; Body Fluid  Result Value Ref Range   Specimen Description      BILE Performed at [facility], [address], [place], [place] [address]    Special Requests      Normal Performed at [facility], [address], [place], [place] [address]    Gram Stain      NO WBC SEEN FEW BUDDING YEAST SEEN RARE GRAM POSITIVE RODS    Culture      CULTURE REINCUBATED FOR BETTER GROWTH Performed at [facility], [address], [place], [place] [address]    Report Status PENDING   CBC     Status: Abnormal   Collection Time: 03/26/24  4:06 AM  Result Value Ref Range   WBC 10.4 4.0 - 10.5 K/uL   RBC 2.98 (L) 4.22 - 5.81 MIL/uL   Hemoglobin 9.2 (L) 13.0 - 17.0 g/dL   HCT 73.3 (L) 60.9 - 47.9 %   MCV 89.3 80.0 - 100.0 fL   MCH 30.9 26.0 - 34.0 pg   MCHC 34.6 30.0 - 36.0 g/dL   RDW 82.5 (H) 88.4 - 84.4 %   Platelets 251 150 - 400 K/uL   nRBC 0.0 0.0 - 0.2 %    Comment: Performed at [facility], [address] [place], [place] [address]  Comprehensive metabolic panel     Status: Abnormal   Collection Time: 03/26/24  4:06 AM  Result Value Ref Range   Sodium 135 135 - 145 mmol/L   Potassium 3.5 3.5 - 5.1 mmol/L   Chloride 101 98 - 111 mmol/L   CO2 24 22 - 32 mmol/L   Glucose, Bld 135 (H) 70 - 99 mg/dL    Comment: Glucose reference range applies only to samples taken after fasting for at least 8 hours.   BUN 17 8 - 23 mg/dL   Creatinine, Ser 8.31 (H) 0.61 - 1.24 mg/dL   Calcium  9.5 8.9 - 10.3 mg/dL   Total Protein 6.4 (L) 6.5 - 8.1 g/dL   Albumin  2.9 (L) 3.5 - 5.0 g/dL   AST 41 15 - 41 U/L   ALT 44 0 - 44 U/L   Alkaline Phosphatase 613 (H) 38 -  126 U/L   Total Bilirubin 3.6 (H) 0.0 -  1.2 mg/dL   GFR, Estimated 40 (L) >60 mL/min    Comment: (NOTE) Calculated using the CKD-EPI Creatinine Equation (2021)    Anion gap 10 5 - 15    Comment: Performed at [facility], [address], [place], [place] [address]  Lipase, blood     Status: Abnormal   Collection Time: 03/26/24  4:06 AM  Result Value Ref Range   Lipase 88 (H) 11 - 51 U/L    Comment: Performed at [facility], [address] [place], [place] [address]    No results found.  Review of Systems See HPI  Blood pressure (!) 125/53, pulse 100, temperature 100.1 F (37.8 C), temperature source Oral, resp. rate 16, height 5' 10 (1.778 m), weight 61.5 kg, SpO2 100%.  Physical Exam Constitutional:      Appearance: He is not ill-appearing or diaphoretic.  Pulmonary:     Effort: Pulmonary effort is normal.  Neurological:     Mental Status: He is alert and oriented to person, place, and time.  Psychiatric:        Mood and Affect: Mood normal.    Bilateral knees: Slight warmth both knees.Positive effusion bilateral.  No erythema. Painful range of motion of left knee.  Slight discomfort with range of motion of the right knee.  Postinjection right knee no longer painful with range of motion.  Left knee still painful range of motion but improved.  Bilateral calf supple.   Assessment/Plan: Patient does not appear septic such that immediate knee arthroscopy with knee with the required.  His white count is trending down. Weight bearing  as tolerated bilateral lower extremities.  Bilateral knees were prepped with Betadine  and lidocaine  was used to anesthetize the knees.  Then both knees were aspirated.  Left knee 50 cc of synovial slightly turbid fluid was obtained.  Right knee yellow slightly turbid fluid obtained 30 cc.  Aspirate was sent for cell count and cultures bilateral knees. Knees were wrapped with Ace bandages which can be removed tomorrow. We will follow-up on cultures  and make further suggestions based on these cultures.   Luis Chen 03/26/2024, 3:48 PM         [1]  Allergies Allergen Reactions   Ace Inhibitors Swelling and Other (See Comments)    Facial swelling    Entresto  [Sacubitril-Valsartan] Swelling and Other (See Comments)    Angiotensin Receptor Neprilysin Inhibitor

## 2024-03-26 NOTE — Progress Notes (Signed)
 Mobility Specialist - Progress Note   03/26/24 1340  Mobility  Activity Ambulated with assistance  Level of Assistance Maximum assist, patient does 25-49%  Assistive Device Front wheel walker  Distance Ambulated (ft) 220 ft  Range of Motion/Exercises Active  Activity Response Tolerated fair  Mobility visit 1 Mobility  Mobility Specialist Start Time (ACUTE ONLY) 1310  Mobility Specialist Stop Time (ACUTE ONLY) 1340  Mobility Specialist Time Calculation (min) (ACUTE ONLY) 30 min   Pt was found on recliner chair and agreeable to mobilize. C/o L knee swelling and pain. RN provided pt with pain medication. Max-A for sit>stand and provided RW to assist with ambulation. At EOS returned to use bathroom prior to returning to bed. All needs met. Call bell in reach and RN in room.   Erminio Leos,  Mobility Specialist Can be reached via Secure Chat

## 2024-03-26 NOTE — Progress Notes (Signed)
 " PROGRESS NOTE    Luis Chen  FMW:[id number] DOB: 05/06/1940 DOA: 03/14/2024 PCP: [name], MD    Chief Complaint  Patient presents with   Hypotension    Brief Narrative:  Luis Chen is a 84 y.o. male with PMH of CKD stage III, CAD, MGUS, HLD who presented with lightheadedness and low blood pressure, and weight loss over the last few weeks. Blood work in the ED showed elevated LFTs, significantly elevated alkaline phosphatase and bilirubin. CT abdomen/pelvis showed concern for pancreatic mass , labs showed AKI.  Gastroenterology and nephrology IR has been consulted.   S/p ERCP but unable to pass guidewire, now s/p perc biliary drain by IR.      Assessment & Plan:   Principal Problem:   Obstructive jaundice (HCC) Active Problems:   Anemia, chronic disease   Chronic kidney disease (CKD), stage III (moderate) (HCC)   MGUS (monoclonal gammopathy of unknown significance)   Coronary artery disease involving native coronary artery of native heart with other form of angina pectoris   Transaminitis   Pancreatic mass   Elevated bilirubin   Abnormal CT of the abdomen   Elevated liver enzymes  #1 obstructive jaundice secondary to pancreatic head mass -MRCP done with concerns for pancreatic head mass associated with severe dorsal pancreatic duct and biliary dilatation. - Patient seen in consultation by GI and underwent ERCP however unable to pass guidewire and IR consulted for percutaneous drain placement which was done 03/17/2024. -Percutaneous drain with output of 600 cc over the past 24 hours. - CA 19-9 noted to be elevated at 2010 - Cytology done with no malignant cells identified. - LFTs trending down. - Patient was on IV Zosyn  which was discontinued per GI recommendations. - Patient being followed by GI and patient s/p EUS and FNA, 03/24/2024 with biopsy pending.   -Patient noted to have developed a fever 03/24/2024 and subsequently placed empirically on IV Unasyn  per GI  recommendations. -Blood cultures pending. -Per GI.  2.  AKI on CKD stage IIIa - Baseline creatinine noted at 1.3-1.5 in 2024 and October 2025. - CT done with no hydronephrosis and bladder thickening possible due to bladder outlet obstruction. - AKI felt likely secondary to volume depletion and bladder outlet obstruction. - Foley catheter in place discontinued on 03/25/2024 as patient noted to have fevers.. -Urine output of 1.425 L over the past 24 hours.   - Patient was hydrated with IV fluids with renal function trending down creatinine down to 1.68 today. - Status post transfusion 2 units PRBCs. - Nephrology was following and feel patient will need urology outpatient follow-up or TOV prior to discharge. - Patient noted to have developed fevers,/Foley catheter discontinued.  -Follow-up postvoid residuals.    3.  Metabolic acidosis -Resolved.  4.  MGUS with normocytic anemia - Patient with no overt bleeding. - Status posttransfusion 2 units PRBCs with hemoglobin currently at 9.2 from 6.8 (03/23/2024).SABRA -GI following. - Transfuse for hemoglobin < 7.  5.  CAD -Antiplatelets on hold and resume once cleared by GI and post procedures.  6.  Glaucoma - Continue eyedrops.  7.  Fever -Patient noted overnight to have a temp of 102.6.Glucose - Leukocytosis trending up. - Patient with EUS with instrumentation and biopsies taken 03/24/2024. - Blood cultures ordered and pending. -Urinalysis obtained with negative leukocytes, nitrite negative, 0-5 WBCs. - Discontinued Foley catheter on 03/25/2024 however patient with increased postvoid residual and will monitor for the next 24 hours and continue current Flomax .  -  Lipase levels obtained per GI noted to be elevated at 115 however patient with no abdominal pain and tolerating oral intake. - Patient started empirically on IV Unasyn  per GI.  8.  Urinary retention -Foley catheter removed 03/25/2024, however patient with difficulty emptying out his  bladder intermittently. - Monitor for the next 24 hours and measure postvoid residual. - Continue Flomax .  9.  Bilateral knee swelling and pain -Patient noted with bilateral swelling of his knees L > right. - Patient noted with prior history of pyogenic arthritis in 2017 requiring antibiotics. - Orthopedics consulted and patient seen by Bertrum Gaskins, PA and patient underwent aspiration of bilateral knees with left knee 50 cc of synovial slightly turbid fluid obtained, right knee with yellow slightly turbid fluid with 30 cc obtained and sent for cell count and cultures. - Patient currently empirically on IV Unasyn . - Appreciate orthopedic input and recommendations     DVT prophylaxis: Heparin  Code Status: Full Family Communication: Updated patient, son at bedside. Disposition: Likely home when clinically improved and cleared by GI.  Status is: Inpatient Remains inpatient appropriate because: Severity of illness   Consultants:  Gastroenterology: Dr. Abran 03/16/2024 Nephrology: Dr. Vassie 03/16/2024 Interventional radiology: Dr.Mugweru 03/17/2024 Orthopedics 03/26/2024  Procedures:  CT abdomen and pelvis 03/14/2024 MRCP 03/15/2024 ERCP 03/17/2024 by Dr. Abran Percutaneous transhepatic biliary tube placement anterograde cholangiogram by IR: Dr.Mugweru 03/17/2024 Transfusion 2 units PRBCs 03/23/2024 EUS per GI: Dr. Rollin 03/24/2024  Antimicrobials:  Anti-infectives (From admission, onward)    Start     Dose/Rate Route Frequency Ordered Stop   03/25/24 1400  Ampicillin -Sulbactam (UNASYN ) 3 g in sodium chloride  0.9 % 100 mL IVPB  Status:  Discontinued        3 g 200 mL/hr over 30 Minutes Intravenous Every 6 hours 03/25/24 1232 03/25/24 1350   03/25/24 1400  Ampicillin -Sulbactam (UNASYN ) 3 g in sodium chloride  0.9 % 100 mL IVPB        3 g 200 mL/hr over 30 Minutes Intravenous Every 12 hours 03/25/24 1350     03/17/24 1615  cefTRIAXone  (ROCEPHIN ) 2 g in sodium chloride  0.9 % 100 mL IVPB         2 g 200 mL/hr over 30 Minutes Intravenous  Once 03/17/24 1517 03/20/24 1256   03/15/24 2000  piperacillin -tazobactam (ZOSYN ) IVPB 2.25 g  Status:  Discontinued        2.25 g 100 mL/hr over 30 Minutes Intravenous Every 8 hours 03/15/24 1555 03/21/24 1129   03/15/24 0800  piperacillin -tazobactam (ZOSYN ) IVPB 3.375 g  Status:  Discontinued        3.375 g 12.5 mL/hr over 240 Minutes Intravenous Every 12 hours 03/15/24 0520 03/15/24 1555   03/15/24 0600  piperacillin -tazobactam (ZOSYN ) IVPB 2.25 g  Status:  Discontinued       Placed in Followed by Linked Group   2.25 g 100 mL/hr over 30 Minutes Intravenous Every 6 hours 03/14/24 2217 03/15/24 0519   03/14/24 2230  piperacillin -tazobactam (ZOSYN ) IVPB 3.375 g       Placed in Followed by Linked Group   3.375 g 100 mL/hr over 30 Minutes Intravenous  Once 03/14/24 2217 03/15/24 9394         Subjective: Patient standing up at bedside with mobility tech and RN.  Patient with complaints of bilateral knee swelling and pain.  Denies any chest pain, no shortness of breath, no abdominal pain.  Son at bedside.   Objective: Vitals:   03/25/24 1339 03/25/24 2142 03/26/24 0540 03/26/24 9363  BP: (!) 143/64 (!) 140/64 128/64   Pulse: 91 87 90   Resp: 18 19 18    Temp: 99.8 F (37.7 C) (!) 100.7 F (38.2 C) 98.6 F (37 C)   TempSrc: Oral Oral Oral   SpO2: 90% 100% 100%   Weight:    61.5 kg  Height:        Intake/Output Summary (Last 24 hours) at 03/26/2024 1346 Last data filed at 03/26/2024 1100 Gross per 24 hour  Intake 470 ml  Output 2005 ml  Net -1535 ml   Filed Weights   03/23/24 0517 03/24/24 0504 03/26/24 0636  Weight: 58.7 kg 61.3 kg 61.5 kg    Examination:  General exam: NAD. Respiratory system: CTAB.  No wheezes, no crackles, no rhonchi.  Fair air movement.  Speaking in full sentences.   Cardiovascular system: Regular rate rhythm no murmurs rubs or gallops.  No JVD.  No lower extremity edema. Gastrointestinal system:  Abdomen soft, nontender, nondistended, positive bowel sounds.  No rebound.  No guarding.  Percutaneous drain in place on the right with bilious drainage noted.  Central nervous system: Alert and oriented.  Moving extremities spontaneously.  No focal neurological deficits. Extremities: Symmetric 5 x 5 power. Skin: No rashes, lesions or ulcers Psychiatry: Judgement and insight appear normal. Mood & affect appropriate.     Data Reviewed: I have personally reviewed following labs and imaging studies  CBC: Recent Labs  Lab 03/22/24 1303 03/23/24 0422 03/23/24 1735 03/24/24 0410 03/24/24 2134 03/25/24 0334 03/26/24 0406  WBC 8.4 9.5  --  8.7 8.8 11.5* 10.4  NEUTROABS 5.4 6.1  --   --  6.7  --   --   HGB 7.9* 6.8* 9.5* 8.9* 9.6* 9.1* 9.2*  HCT 22.8* 19.1* 26.5* 25.1* 27.2* 26.7* 26.6*  MCV 90.1 88.8  --  87.5 88.9 90.8 89.3  PLT 264 252  --  241 241 235 251    Basic Metabolic Panel: Recent Labs  Lab 03/20/24 0355 03/22/24 1303 03/23/24 0422 03/24/24 0410 03/24/24 2134 03/25/24 0334 03/26/24 0406  NA 140 138 137 138 137 138 135  K 3.4* 3.9 3.7 3.7 4.0 3.8 3.5  CL 103 103 105 105 104 105 101  CO2 29 26 25 23 23 25 24   GLUCOSE 98 99 105* 163* 166* 96 135*  BUN 21 18 16 16 17 15 17   CREATININE 2.65* 2.03* 1.94* 1.91* 1.85* 1.80* 1.68*  CALCIUM  8.7* 9.4 9.1 9.1 9.3 9.4 9.5  MG  --  1.7  --   --   --   --   --   PHOS 3.0 2.9  --   --   --   --   --     GFR: Estimated Creatinine Clearance: 28 mL/min (A) (by C-G formula based on SCr of 1.68 mg/dL (H)).  Liver Function Tests: Recent Labs  Lab 03/23/24 0422 03/24/24 0410 03/24/24 2134 03/25/24 0334 03/26/24 0406  AST 35 38 40 48* 41  ALT 70* 60* 58* 55* 44  ALKPHOS 796* 715* 731* 686* 613*  BILITOT 2.9* 3.3* 3.5* 3.9* 3.6*  PROT 6.0* 6.0* 6.6 6.4* 6.4*  ALBUMIN  2.9* 2.8* 3.0* 3.0* 2.9*    CBG: No results for input(s): GLUCAP in the last 168 hours.   Recent Results (from the past 240 hours)  Aerobic/Anaerobic  Culture w Gram Stain (surgical/deep wound)     Status: None   Collection Time: 03/17/24  5:58 PM   Specimen: BILE  Result Value Ref Range  Status   Specimen Description   Final    BILE Performed at [facility], [address] [place], [place] [address]    Special Requests   Final    NONE Performed at [facility], [address] [place], [place] [address]    Gram Stain NO WBC SEEN NO ORGANISMS SEEN   Final   Culture   Final    No growth aerobically or anaerobically. Performed at [facility], [address], [place], [place] [address]    Report Status 03/22/2024 FINAL  Final  Culture, blood (x 2)     Status: None (Preliminary result)   Collection Time: 03/24/24  9:34 PM   Specimen: BLOOD RIGHT ARM  Result Value Ref Range Status   Specimen Description BLOOD RIGHT ARM  Final   Special Requests   Final    BOTTLES DRAWN AEROBIC ONLY Blood Culture adequate volume   Culture   Final    NO GROWTH 1 DAY Performed at [facility], [address] [place], [place] [address]    Report Status PENDING  Incomplete  Culture, blood (x 2)     Status: None (Preliminary result)   Collection Time: 03/24/24  9:34 PM   Specimen: BLOOD LEFT ARM  Result Value Ref Range Status   Specimen Description   Final    BLOOD LEFT ARM Performed at [facility], [address] [place], [place] [address]    Special Requests   Final    BOTTLES DRAWN AEROBIC AND ANAEROBIC Blood Culture adequate volume Performed at [facility], [address], [place], [place] [address]    Culture   Final    NO GROWTH 1 DAY Performed at [facility], [address], [place], [place] [address]    Report Status PENDING  Incomplete  Body fluid culture w Gram Stain     Status: None (Preliminary result)   Collection Time: 03/25/24  9:49 AM   Specimen: BILE; Body Fluid  Result Value Ref Range Status   Specimen Description   Final     BILE Performed at [facility], [address] [place], [place] [address]    Special Requests   Final    Normal Performed at [facility], [address], [place], [place] [address]    Gram Stain   Final    NO WBC SEEN FEW BUDDING YEAST SEEN RARE GRAM POSITIVE RODS    Culture   Final    CULTURE REINCUBATED FOR BETTER GROWTH Performed at [facility], [address], [place], [place] [address]    Report Status PENDING  Incomplete         Radiology Studies: No results found.      Scheduled Meds:  Chlorhexidine  Gluconate Cloth  6 each Topical Daily   diclofenac  Sodium  2 g Topical TID   heparin  injection (subcutaneous)  5,000 Units Subcutaneous Q8H   latanoprost   1 drop Both Eyes Daily   senna  1 tablet Oral BID   sodium chloride  flush  5 mL Intracatheter Q8H   tamsulosin   0.4 mg Oral QPC supper   Continuous Infusions:  ampicillin -sulbactam (UNASYN ) IV 3 g (03/26/24 0228)     LOS: 11 days    Time spent: 40 minutes    Toribio Hummer, MD [facility]   To contact the attending provider between 7A-7P or the covering provider during after hours 7P-7A, please  log into the web site [email or web address] and access using universal [place] password for that web site. If you do not have the password, please call the hospital operator.  03/26/2024, 1:46 PM    "

## 2024-03-26 NOTE — Plan of Care (Signed)
  Problem: Nutrition: Goal: Adequate nutrition will be maintained Outcome: Progressing   Problem: Elimination: Goal: Will not experience complications related to bowel motility Outcome: Not Progressing   Problem: Pain Managment: Goal: General experience of comfort will improve and/or be controlled Outcome: Not Progressing

## 2024-03-26 NOTE — Progress Notes (Signed)
 "   Progress Note   Assessment    84 year old admitted with weight loss, elevated LFTs and obstructive jaundice associated with pancreatic mass and biliary obstruction; status post internal/external percutaneous biliary drain with IR and now EUS with FNA on 03/24/2024; febrile 03/25/2024; bilateral knee warmth and swelling and pain 03/26/2024  Principal Problem:   Obstructive jaundice (HCC) Active Problems:   Anemia, chronic disease   Chronic kidney disease (CKD), stage III (moderate) (HCC)   MGUS (monoclonal gammopathy of unknown significance)   Coronary artery disease involving native coronary artery of native heart with other form of angina pectoris   Transaminitis   Pancreatic mass   Elevated bilirubin   Abnormal CT of the abdomen   Elevated liver enzymes   Recommendations   1.  Pancreatic mass causing biliary obstruction --status post PTC with internal/external biliary drain, functioning well.  EUS with FNA on 03/24/2024; cytology pending.  Transient fever responding to Unasyn , Unasyn  started 03/25/2024 -- Await cytology from pancreatic FNA; if positive will need oncology consultation -- Continue Unasyn  for now -- Follow-up blood cultures -- Foley catheter has been removed and he is voiding -- See #2  2.  Bilateral knee pain and swelling --significant bilateral knee effusions.  This has occurred before.  He recalled in 2017 when he was admitted.  I did find this admission for pyogenic arthritis.  Required antibiotics. -- Defer to primary team, significant pain and swelling in bilateral knees; consideration of arthrocentesis for sample, cell count and culture   Dr. Shila will assume care of the [facility] GI service tomorrow We will await cytology and follow-up, call if questions  35 minutes total spent today including patient facing time, coordination of care, reviewing medical history/procedures/pertinent radiology studies, and documentation of the encounter.    Chief  Complaint  Son in room with patient No further overnight fevers Bilateral knee swelling and significant tenderness impairing ambulation today Had a good bowel movement, nonbloody Tolerating regular diet EUS cytology remains pending  Vital signs in last 24 hours: Temp:  [98.6 F (37 C)-100.7 F (38.2 C)] 98.6 F (37 C) (01/18 0540) Pulse Rate:  [87-91] 90 (01/18 0540) Resp:  [18-19] 18 (01/18 0540) BP: (128-143)/(64) 128/64 (01/18 0540) SpO2:  [90 %-100 %] 100 % (01/18 0540) Weight:  [61.5 kg] 61.5 kg (01/18 0636) Last BM Date : 03/26/24 Gen: awake, alert, NAD HEENT: anicteric  CV: RRR, no mrg Pulm: CTA b/l Abd: soft, NT/ND, +BS throughout Ext: no c/c, b/l knee effusions and increased warmth Neuro: nonfocal   Intake/Output from previous day: 01/17 0701 - 01/18 0700 In: 590 [P.O.:480; I.V.:10; IV Piggyback:100] Out: 2025 [Urine:1425; Drains:600] Intake/Output this shift: Total I/O In: -  Out: 380 [Urine:30; Drains:350]  Lab Results: Recent Labs    03/24/24 2134 03/25/24 0334 03/26/24 0406  WBC 8.8 11.5* 10.4  HGB 9.6* 9.1* 9.2*  HCT 27.2* 26.7* 26.6*  PLT 241 235 251   BMET Recent Labs    03/24/24 2134 03/25/24 0334 03/26/24 0406  NA 137 138 135  K 4.0 3.8 3.5  CL 104 105 101  CO2 23 25 24   GLUCOSE 166* 96 135*  BUN 17 15 17   CREATININE 1.85* 1.80* 1.68*  CALCIUM  9.3 9.4 9.5   LFT Recent Labs    03/26/24 0406  PROT 6.4*  ALBUMIN  2.9*  AST 41  ALT 44  ALKPHOS 613*  BILITOT 3.6*    LOS: 11 days   Gordy CHRISTELLA Starch, MD 03/26/2024, 1:04 PM See [facility], to contact our on call provider   "

## 2024-03-27 DIAGNOSIS — D638 Anemia in other chronic diseases classified elsewhere: Secondary | ICD-10-CM | POA: Diagnosis not present

## 2024-03-27 DIAGNOSIS — D472 Monoclonal gammopathy: Secondary | ICD-10-CM | POA: Diagnosis not present

## 2024-03-27 DIAGNOSIS — R7401 Elevation of levels of liver transaminase levels: Secondary | ICD-10-CM | POA: Diagnosis not present

## 2024-03-27 DIAGNOSIS — N1831 Chronic kidney disease, stage 3a: Secondary | ICD-10-CM | POA: Diagnosis not present

## 2024-03-27 DIAGNOSIS — K862 Cyst of pancreas: Secondary | ICD-10-CM | POA: Diagnosis not present

## 2024-03-27 DIAGNOSIS — M25562 Pain in left knee: Secondary | ICD-10-CM | POA: Diagnosis not present

## 2024-03-27 DIAGNOSIS — M170 Bilateral primary osteoarthritis of knee: Secondary | ICD-10-CM | POA: Diagnosis not present

## 2024-03-27 DIAGNOSIS — M25561 Pain in right knee: Secondary | ICD-10-CM | POA: Diagnosis not present

## 2024-03-27 DIAGNOSIS — K831 Obstruction of bile duct: Secondary | ICD-10-CM | POA: Diagnosis not present

## 2024-03-27 DIAGNOSIS — K8689 Other specified diseases of pancreas: Secondary | ICD-10-CM | POA: Diagnosis not present

## 2024-03-27 DIAGNOSIS — I25118 Atherosclerotic heart disease of native coronary artery with other forms of angina pectoris: Secondary | ICD-10-CM | POA: Diagnosis not present

## 2024-03-27 DIAGNOSIS — M25462 Effusion, left knee: Secondary | ICD-10-CM | POA: Diagnosis not present

## 2024-03-27 DIAGNOSIS — M25461 Effusion, right knee: Secondary | ICD-10-CM | POA: Diagnosis not present

## 2024-03-27 DIAGNOSIS — R748 Abnormal levels of other serum enzymes: Secondary | ICD-10-CM | POA: Diagnosis not present

## 2024-03-27 LAB — URINALYSIS, COMPLETE (UACMP) WITH MICROSCOPIC
Bilirubin Urine: NEGATIVE
Glucose, UA: 50 mg/dL — AB
Ketones, ur: NEGATIVE mg/dL
Nitrite: NEGATIVE
Protein, ur: 100 mg/dL — AB
Specific Gravity, Urine: 1.018 (ref 1.005–1.030)
pH: 5 (ref 5.0–8.0)

## 2024-03-27 LAB — URINALYSIS, ROUTINE W REFLEX MICROSCOPIC
Bilirubin Urine: NEGATIVE
Glucose, UA: 50 mg/dL — AB
Ketones, ur: NEGATIVE mg/dL
Leukocytes,Ua: NEGATIVE
Nitrite: NEGATIVE
Protein, ur: 30 mg/dL — AB
Specific Gravity, Urine: 1.017 (ref 1.005–1.030)
pH: 5 (ref 5.0–8.0)

## 2024-03-27 LAB — CBC WITH DIFFERENTIAL/PLATELET
Abs Immature Granulocytes: 0.04 K/uL (ref 0.00–0.07)
Basophils Absolute: 0.1 K/uL (ref 0.0–0.1)
Basophils Relative: 1 %
Eosinophils Absolute: 0.5 K/uL (ref 0.0–0.5)
Eosinophils Relative: 5 %
HCT: 24.7 % — ABNORMAL LOW (ref 39.0–52.0)
Hemoglobin: 8.5 g/dL — ABNORMAL LOW (ref 13.0–17.0)
Immature Granulocytes: 0 %
Lymphocytes Relative: 16 %
Lymphs Abs: 1.7 K/uL (ref 0.7–4.0)
MCH: 31.3 pg (ref 26.0–34.0)
MCHC: 34.4 g/dL (ref 30.0–36.0)
MCV: 90.8 fL (ref 80.0–100.0)
Monocytes Absolute: 0.9 K/uL (ref 0.1–1.0)
Monocytes Relative: 9 %
Neutro Abs: 7 K/uL (ref 1.7–7.7)
Neutrophils Relative %: 69 %
Platelets: 250 K/uL (ref 150–400)
RBC: 2.72 MIL/uL — ABNORMAL LOW (ref 4.22–5.81)
RDW: 17.4 % — ABNORMAL HIGH (ref 11.5–15.5)
WBC: 10.2 K/uL (ref 4.0–10.5)
nRBC: 0 % (ref 0.0–0.2)

## 2024-03-27 LAB — COMPREHENSIVE METABOLIC PANEL WITH GFR
ALT: 41 U/L (ref 0–44)
AST: 36 U/L (ref 15–41)
Albumin: 2.7 g/dL — ABNORMAL LOW (ref 3.5–5.0)
Alkaline Phosphatase: 557 U/L — ABNORMAL HIGH (ref 38–126)
Anion gap: 10 (ref 5–15)
BUN: 21 mg/dL (ref 8–23)
CO2: 25 mmol/L (ref 22–32)
Calcium: 9.3 mg/dL (ref 8.9–10.3)
Chloride: 103 mmol/L (ref 98–111)
Creatinine, Ser: 1.76 mg/dL — ABNORMAL HIGH (ref 0.61–1.24)
GFR, Estimated: 37 mL/min — ABNORMAL LOW
Glucose, Bld: 107 mg/dL — ABNORMAL HIGH (ref 70–99)
Potassium: 3.6 mmol/L (ref 3.5–5.1)
Sodium: 138 mmol/L (ref 135–145)
Total Bilirubin: 3.5 mg/dL — ABNORMAL HIGH (ref 0.0–1.2)
Total Protein: 6.3 g/dL — ABNORMAL LOW (ref 6.5–8.1)

## 2024-03-27 LAB — URINE CULTURE: Culture: NO GROWTH

## 2024-03-27 LAB — CYTOLOGY - NON PAP

## 2024-03-27 LAB — URIC ACID: Uric Acid, Serum: 4 mg/dL (ref 3.7–8.6)

## 2024-03-27 LAB — PATHOLOGIST SMEAR REVIEW

## 2024-03-27 LAB — LIPASE, BLOOD: Lipase: 101 U/L — ABNORMAL HIGH (ref 11–51)

## 2024-03-27 MED ORDER — CARBAMIDE PEROXIDE 6.5 % OT SOLN
5.0000 [drp] | Freq: Two times a day (BID) | OTIC | Status: DC
  Administered 2024-03-27 – 2024-03-30 (×6): 5 [drp] via OTIC
  Filled 2024-03-27: qty 15

## 2024-03-27 NOTE — Progress Notes (Signed)
 "   Referring Provider(s): Dr. Norleen Kiang, MD.  Supervising Physician: Philip Cornet  Patient Status:  [facility] - In-pt  Chief Complaint: Biliary obstruction, pancreatic mass; s/p I/E biliary drain placement 03/17/24.  Brief History: 84 y.o. male admitted on 1/7 with obstructive jaundice with associated pancreatic head mass, and biliary and pancreatic duct dilatation concerning for pancreatic adenocarcinoma, with mild gallbladder distention and mild gallbladder wall thickening. Patient is s/p right I/E biliary drain placement on 03/17/24 (10 fr to bag) with bile cytology neg, and s/p EUS/FNA panc head mass 1/16 (pathology remains pending).    Subjective:  Patient alert and laying in bed, calm.  Currently without any significant complaints. He feels well. Patient denies any fevers, headache, chest pain, SOB, cough, abdominal pain, nausea, vomiting or bleeding.   Allergies: Ace inhibitors and Entresto  [sacubitril-valsartan]  Medications: Prior to Admission medications  Medication Sig Start Date End Date Taking? Authorizing Provider  aspirin  81 MG EC tablet Take 1 tablet (81 mg total) by mouth daily. 07/28/19  Yes Meng, Hao, PA  carvedilol  (COREG ) 6.25 MG tablet Take 0.5 tablets (3.125 mg total) by mouth 2 (two) times daily with a meal. 05/28/20  Yes [name], MD  Cholecalciferol (VITAMIN D3) 1000 units CAPS Take 1,000 Units by mouth daily.   Yes [provider]  diclofenac  Sodium (VOLTAREN ) 1 % GEL 2 g 4 (four) times daily as needed (for pain- left knee and any other affected area). 03/08/23  Yes [provider]  ezetimibe  (ZETIA ) 10 MG tablet Take 10 mg by mouth daily.   Yes [provider]  Ferrous Gluconate  324 (37.5 Fe) MG TABS TAKE 1 TABLET BY MOUTH TWICE DAILY WITH MEALS Patient taking differently: Take 324 mg by mouth See admin instructions. Take 324 mg by mouth with food on Mon/Wed/Fri 02/08/18  Yes Shadad, Firas N, MD  KETOTIFEN FUMARATE OP Place 1 drop into  both eyes See admin instructions. Ketotifen 0.025% eye drops - Instill 1 drop into both eyes one to two times a day   Yes [provider]  latanoprost  (XALATAN ) 0.005 % ophthalmic solution Place 1 drop into both eyes at bedtime. 01/22/17  Yes [provider]  losartan  (COZAAR ) 50 MG tablet Take 50 mg by mouth 2 (two) times daily.   Yes [provider]  Nutritional Supplements (SUPLENA/CARB STEADY) LIQD Take 237 mLs by mouth See admin instructions. Drink 237 ml's of Vanilla Suplena by mouth three times a day   Yes [provider]  rosuvastatin  (CRESTOR ) 40 MG tablet TAKE 1 TABLET BY MOUTH ONCE DAILY AT  6PM Patient taking differently: Take 40 mg by mouth at bedtime. 11/07/18  Yes [name], MD  traZODone  (DESYREL ) 150 MG tablet Take 150 mg by mouth at bedtime as needed for sleep.    Yes [provider]  trolamine salicylate (ASPERCREME) 10 % cream Apply 1 Application topically as needed (for left knee pain and/or other affected areas).   Yes [provider]  ezetimibe  (ZETIA ) 10 MG tablet Take 1 tablet (10 mg total) by mouth daily. Patient not taking: Reported on 03/15/2024 12/05/18 03/15/24  Rolan Ezra RAMAN, MD  oxyCODONE -acetaminophen  (PERCOCET/ROXICET) 5-325 MG tablet Take 1 tablet by mouth every 6 (six) hours as needed for severe pain (pain score 7-10). Patient not taking: Reported on 03/15/2024 03/09/23   Henderly, Britni A, PA-C  spironolactone  (ALDACTONE ) 25 MG tablet Take 1 tablet (25 mg total) by mouth daily. Patient not taking: Reported on 03/15/2024 12/06/18 03/15/24  Rolan Ezra RAMAN, MD     Vital Signs: BP (!) 145/63   Pulse 85   Temp 98.2 F (36.8 C) (Oral)   Resp 20   Ht 5' 10 (1.778 m)   Wt 130 lb 4.7 oz (59.1 kg)   SpO2 100%   BMI 18.69 kg/m   Physical Exam Constitutional:      Appearance: Normal appearance.  Cardiovascular:     Rate and Rhythm: Normal rate.  Pulmonary:     Effort: Pulmonary effort is normal.   Abdominal:     General: Abdomen is flat.     Palpations: Abdomen is soft.     Tenderness: There is no abdominal tenderness.     Comments: RUQ drain appropriately dressed. Dressing is clean, dry, intact. Drain incision site non-tender, without evidence of infection. Retaining suture in place.  Good bilious output in collection bag. Line flushes well.    Skin:    General: Skin is warm and dry.  Neurological:     Mental Status: He is alert and oriented to person, place, and time.     Labs:  CBC: Recent Labs    03/24/24 2134 03/25/24 0334 03/26/24 0406 03/27/24 0342  WBC 8.8 11.5* 10.4 10.2  HGB 9.6* 9.1* 9.2* 8.5*  HCT 27.2* 26.7* 26.6* 24.7*  PLT 241 235 251 250    COAGS: Recent Labs    03/14/24 1730 03/16/24 0447  INR 1.0 1.1    BMP: Recent Labs    03/24/24 2134 03/25/24 0334 03/26/24 0406 03/27/24 0342  NA 137 138 135 138  K 4.0 3.8 3.5 3.6  CL 104 105 101 103  CO2 23 25 24 25   GLUCOSE 166* 96 135* 107*  BUN 17 15 17 21   CALCIUM  9.3 9.4 9.5 9.3  CREATININE 1.85* 1.80* 1.68* 1.76*  GFRNONAA 35* 36* 40* 37*    LIVER FUNCTION TESTS: Recent Labs    03/24/24 2134 03/25/24 0334 03/26/24 0406 03/27/24 0342  BILITOT 3.5* 3.9* 3.6* 3.5*  AST 40 48* 41 36  ALT 58* 55* 44 41  ALKPHOS 731* 686* 613* 557*  PROT 6.6 6.4* 6.4* 6.3*  ALBUMIN  3.0* 3.0* 2.9* 2.7*    Assessment and Plan: Biliary obstruction secondary to pancreatic mass, s/p I/E biliary drain placement. 84 y.o. male admitted on 1/7 with obstructive jaundice with associated pancreatic head mass, and biliary and pancreatic duct dilatation concerning for pancreatic adenocarcinoma, with mild gallbladder distention and mild gallbladder wall thickening.  - Patient is s/p right I/E biliary drain placement on 03/17/24 with negative bile cytology. - Patient is s/p EUS/FNA panc head mass 1/16; Pathology remains pending.   - WBC 10.2, Hgb decreased to 8.5, creat 1.76 (slightly decreased from 1.85), T bili  stable at 3.5, LFT's improving.  - Latest blood culture on 1/16 is negative to date. - New bile culture on 1/17 grew moderate Candida glabrata and Enterococcus faecalis.  - Urine and synovial culture on 1/18 are pending as well.   Drain Location: RUQ Size: Fr size: 10 Fr Date of placement: 03/17/2024  Currently to: Drain collection device: gravity 24 hour output:  Output by Drain (mL) 03/25/24 0701 - 03/25/24 1900 03/25/24 1901 - 03/26/24 0700 03/26/24 0701 - 03/26/24 1900 03/26/24 1901 - 03/27/24 0700 03/27/24 0701 - 03/27/24 1220  Biliary Tube RLQ 250 350 350 900     Interval imaging/drain manipulation:  None.   Current examination: Flushes easily.  Insertion site unremarkable. Suture in place. Dressed appropriately.   Plan: If T  bili continues to climb, consider non-contrasted CT.  Continue TID flushes with 5 cc NS. Record output Q shift. Dressing changes QD or PRN if soiled.  Call IR APP or on call IR MD if difficulty flushing or sudden change in drain output.  Repeat imaging/possible drain injection once output < 10 mL/QD (excluding flush material). Consideration for drain removal if output is < 10 mL/QD (excluding flush material), pending discussion with the providing surgical service.  Discharge planning: Please contact IR APP or on call IR MD prior to patient d/c to ensure appropriate follow up plans are in place. Typically patient will follow up with IR clinic 10-14 days post d/c for repeat imaging/possible drain injection. IR scheduler will contact patient with date/time of appointment. Patient will need to flush drain QD with 5 cc NS, record output QD, dressing changes every 2-3 days or earlier if soiled.   IR will continue to follow - please call with questions or concerns.    Thank you for this interesting consult.  I greatly enjoyed meeting Kato E Choquette and look forward to participating in their care.   Electronically Signed: Carlin DELENA Griffon, PA-C 03/27/2024,  12:20 PM     I spent a total of 15 Minutes at the the patient's bedside AND on the patient's hospital floor or unit, greater than 50% of which was counseling/coordinating care for RUQ drain care and follow-up.  "

## 2024-03-27 NOTE — Progress Notes (Addendum)
 "   Progress Note   Subjective  Chief Complaint: Obstructive jaundice associated with pancreatic mass and biliary obstruction, status post internal/external percutaneous biliary drain with IR and now EUS with FNA on 03/24/2024  This morning patient found talking with his friend at his bedside.  He tells me he is doing well able to tolerate a fairly regular diet today.  No increase in abdominal pain, biliary drain is draining.  He did have fluid pulled off of both of his knees which is helped with his discomfort and they are now wrapped.  Denies any new complaints or concerns.  No further fever.  He does have a complaint about earwax.   Objective   Vital signs in last 24 hours: Temp:  [98.2 F (36.8 C)-101.9 F (38.8 C)] 98.2 F (36.8 C) (01/19 9391) Pulse Rate:  [85-100] 85 (01/19 0608) Resp:  [16-20] 20 (01/19 0608) BP: (105-145)/(53-63) 145/63 (01/19 0608) SpO2:  [97 %-100 %] 100 % (01/19 9391) Weight:  [59.1 kg] 59.1 kg (01/19 0608) Last BM Date : 03/26/24 General:    AA male in NAD Heart:  Regular rate and rhythm; no murmurs Lungs: Respirations even and unlabored, lungs CTA bilaterally Abdomen:  Soft, nontender and nondistended. Normal bowel sounds. +biliary drain with 100 cc of green fluid Psych:  Cooperative. Normal mood and affect.  Intake/Output from previous day: 01/18 0701 - 01/19 0700 In: 105 [I.V.:5; IV Piggyback:100] Out: 2080 [Urine:830; Drains:1250]   Lab Results: Recent Labs    03/25/24 0334 03/26/24 0406 03/27/24 0342  WBC 11.5* 10.4 10.2  HGB 9.1* 9.2* 8.5*  HCT 26.7* 26.6* 24.7*  PLT 235 251 250   BMET Recent Labs    03/25/24 0334 03/26/24 0406 03/27/24 0342  NA 138 135 138  K 3.8 3.5 3.6  CL 105 101 103  CO2 25 24 25   GLUCOSE 96 135* 107*  BUN 15 17 21   CREATININE 1.80* 1.68* 1.76*  CALCIUM  9.4 9.5 9.3      Latest Ref Rng & Units 03/27/2024    3:42 AM 03/26/2024    4:06 AM 03/25/2024    3:34 AM  Hepatic Function  Total Protein 6.5 - 8.1  g/dL 6.3  6.4  6.4   Albumin  3.5 - 5.0 g/dL 2.7  2.9  3.0   AST 15 - 41 U/L 36  41  48   ALT 0 - 44 U/L 41  44  55   Alk Phosphatase 38 - 126 U/L 557  613  686   Total Bilirubin 0.0 - 1.2 mg/dL 3.5  3.6  3.9        Assessment / Plan:   Assessment: 1.  Pancreatic mass causing biliary obstruction-status post PTC with internal/external biliary drain, functioning well.  EUS with FNA on 03/24/2024; cytology pending.  Transient fever responding to Unasyn , Unasyn  started 03/25/2024. -Awaiting cytology from pancreatic FNA-if positive will need oncology consultation - Continue Unasyn  - Follow-up blood cultures  2.  Bilateral knee pain and swelling: Significant bilateral knee effusions, has occurred before, recently drained and thought inflammatory arthropathy, orthopedic physicians are following  Thank you for your kind consultation.    LOS: 12 days   Delon Hendricks Failing  03/27/2024, 12:04 PM   Attending physician's note   I personally saw the patient and performed a substantive portion of the medical decision making process for this encounter (including a complete performance of the key components : MDM, Hx and Exam), in conjunction with the APP.  I agree with  the APP's note, impression, and  the management plan for the number and complexity of problems addressed at the encounter for the patient and take responsibility for that plan with its inherent risk of complications, morbidity, or mortality with additional input as follows.     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 The patient was provided an opportunity to ask questions and all were answered. The patient agreed with the plan and demonstrated an understanding of the instructions.   LOIS Wilkie Mcgee , MD [phone]       "

## 2024-03-27 NOTE — Progress Notes (Signed)
 " PROGRESS NOTE    Luis Chen  FMW:[id number] DOB: Jun 18, 1940 DOA: 03/14/2024 PCP: [name], MD    Chief Complaint  Patient presents with   Hypotension    Brief Narrative:  Luis Chen is a 84 y.o. male with PMH of CKD stage III, CAD, MGUS, HLD who presented with lightheadedness and low blood pressure, and weight loss over the last few weeks. Blood work in the ED showed elevated LFTs, significantly elevated alkaline phosphatase and bilirubin. CT abdomen/pelvis showed concern for pancreatic mass , labs showed AKI.  Gastroenterology and nephrology IR has been consulted.   S/p ERCP but unable to pass guidewire, now s/p perc biliary drain by IR.      Assessment & Plan:   Principal Problem:   Obstructive jaundice (HCC) Active Problems:   Anemia, chronic disease   Chronic kidney disease (CKD), stage III (moderate) (HCC)   MGUS (monoclonal gammopathy of unknown significance)   Coronary artery disease involving native coronary artery of native heart with other form of angina pectoris   Transaminitis   Pancreatic mass   Elevated bilirubin   Abnormal CT of the abdomen   Elevated liver enzymes  #1 obstructive jaundice secondary to pancreatic head mass -MRCP done with concerns for pancreatic head mass associated with severe dorsal pancreatic duct and biliary dilatation. - Patient seen in consultation by GI and underwent ERCP however unable to pass guidewire and IR consulted for percutaneous drain placement which was done 03/17/2024. -Percutaneous drain with output of 1250 cc over the past 24 hours. - CA 19-9 noted to be elevated at 2010 - Cytology done with no malignant cells identified. - LFTs trending down. - Patient was on IV Zosyn  which was discontinued per GI recommendations. - Patient being followed by GI and patient s/p EUS and FNA, 03/24/2024 with biopsy pending.   -Patient noted to have developed a fever 03/24/2024 and subsequently placed empirically on IV Unasyn  to  complete a 7-day course per GI recommendations. -Blood cultures pending with no growth to date. -Per GI.  2.  AKI on CKD stage IIIa - Baseline creatinine noted at 1.3-1.5 in 2024 and October 2025. - CT done with no hydronephrosis and bladder thickening possible due to bladder outlet obstruction. - AKI felt likely secondary to volume depletion and bladder outlet obstruction. - Foley catheter in place discontinued on 03/25/2024 as patient noted to have fevers.. -Urine output of 830 cc over the past 24 hours.  - Patient was hydrated with IV fluids with renal function fluctuating and currently at 1.76 today.  - Status post transfusion 2 units PRBCs. -Patient noted to have failed voiding trial and Foley catheter placed back in last night, 03/26/2024 per patient. -Monitor urine output. - Nephrology was following and feel patient will need urology outpatient follow-up.  3.  Metabolic acidosis -Resolved.  4.  MGUS with normocytic anemia - Patient with no overt bleeding. - Status posttransfusion 2 units PRBCs with hemoglobin currently at 8.5 from 9.2 from 6.8 (03/23/2024).SABRA -GI following. - Transfuse for hemoglobin < 7.  5.  CAD -Antiplatelets on hold and resume once cleared by GI and post procedures.  6.  Glaucoma - Continue eyedrops.  7.  Fever -Patient noted overnight to have a temp of 102.6.Glucose - Leukocytosis trending up. - Patient with EUS with instrumentation and biopsies taken 03/24/2024. - Blood cultures pending with no growth to date.  -Urinalysis obtained with negative leukocytes, nitrite negative, 0-5 WBCs. -Cultures obtained from bile 03/25/2024 with moderate Candida  glabrata, few Enterococcus faecalis with sepsis debilities pending. - Discontinued Foley catheter on 03/25/2024 however patient with increased postvoid residual and Foley catheter placed back in. - Lipase levels obtained per GI noted to be elevated at 115 however patient with no abdominal pain and tolerating oral  intake. - Continue empiric IV Unasyn  to complete a 7-day course per GI recommendations.   8.  Urinary retention -Foley catheter removed 03/25/2024, however patient with difficulty emptying out his bladder intermittently. - Patient noted to have continued urinary retention and Foley catheter had to be placed back in overnight.   - Continue Flomax .   - Will be discharged with Foley catheter in place with outpatient follow-up with urology.   9.  Bilateral knee swelling and pain -Patient noted with bilateral swelling of his knees L > right. - Patient noted with prior history of pyogenic arthritis in 2017 requiring antibiotics. - Orthopedics consulted and patient seen by Bertrum Gaskins, PA and patient underwent aspiration of bilateral knees with left knee 50 cc of synovial slightly turbid fluid obtained, right knee with yellow slightly turbid fluid with 30 cc obtained and sent for cell count and cultures. -Cultures with no growth so far to date. -White cell count noted on the right knee with 530, white count noted on the left knee with 19,740, no crystals seen. -Orthopedics feel more consistent with inflammatory arthropathy. - Patient currently empirically on IV Unasyn . - Appreciate orthopedic input and recommendations     DVT prophylaxis: Heparin  Code Status: Full Family Communication: Updated patient, son at bedside. Disposition: Likely home when clinically improved and cleared by GI.  Status is: Inpatient Remains inpatient appropriate because: Severity of illness   Consultants:  Gastroenterology: Dr. Abran 03/16/2024 Nephrology: Dr. Vassie 03/16/2024 Interventional radiology: Dr.Mugweru 03/17/2024 Orthopedics 03/26/2024 Orthopedics: Dr. Vernetta 03/26/2024  Procedures:  CT abdomen and pelvis 03/14/2024 MRCP 03/15/2024 ERCP 03/17/2024 by Dr. Abran Percutaneous transhepatic biliary tube placement anterograde cholangiogram by IR: Dr.Mugweru 03/17/2024 Transfusion 2 units PRBCs 03/23/2024 EUS per  GI: Dr. Rollin 03/24/2024 Bilateral knee aspiration per orthopedics 03/26/2024.  Antimicrobials:  Anti-infectives (From admission, onward)    Start     Dose/Rate Route Frequency Ordered Stop   03/25/24 1400  Ampicillin -Sulbactam (UNASYN ) 3 g in sodium chloride  0.9 % 100 mL IVPB  Status:  Discontinued        3 g 200 mL/hr over 30 Minutes Intravenous Every 6 hours 03/25/24 1232 03/25/24 1350   03/25/24 1400  Ampicillin -Sulbactam (UNASYN ) 3 g in sodium chloride  0.9 % 100 mL IVPB        3 g 200 mL/hr over 30 Minutes Intravenous Every 12 hours 03/25/24 1350     03/17/24 1615  cefTRIAXone  (ROCEPHIN ) 2 g in sodium chloride  0.9 % 100 mL IVPB        2 g 200 mL/hr over 30 Minutes Intravenous  Once 03/17/24 1517 03/20/24 1256   03/15/24 2000  piperacillin -tazobactam (ZOSYN ) IVPB 2.25 g  Status:  Discontinued        2.25 g 100 mL/hr over 30 Minutes Intravenous Every 8 hours 03/15/24 1555 03/21/24 1129   03/15/24 0800  piperacillin -tazobactam (ZOSYN ) IVPB 3.375 g  Status:  Discontinued        3.375 g 12.5 mL/hr over 240 Minutes Intravenous Every 12 hours 03/15/24 0520 03/15/24 1555   03/15/24 0600  piperacillin -tazobactam (ZOSYN ) IVPB 2.25 g  Status:  Discontinued       Placed in Followed by Linked Group   2.25 g 100 mL/hr over 30 Minutes  Intravenous Every 6 hours 03/14/24 2217 03/15/24 0519   03/14/24 2230  piperacillin -tazobactam (ZOSYN ) IVPB 3.375 g       Placed in Followed by Linked Group   3.375 g 100 mL/hr over 30 Minutes Intravenous  Once 03/14/24 2217 03/15/24 9394         Subjective: Patient laying in bed.  Denies any chest pain or shortness of breath.  No abdominal pain.  Feels improvement with bilateral knee pain and swelling after aspiration was done by orthopedics yesterday.  Tolerating current diet.  Son at bedside.   Objective: Vitals:   03/26/24 2101 03/26/24 2316 03/27/24 0311 03/27/24 0608  BP: 124/60 (!) 105/59 126/60 (!) 145/63  Pulse: 99 98 85 85  Resp: 20 20  20    Temp: (!) 101.9 F (38.8 C) 100.2 F (37.9 C) 98.6 F (37 C) 98.2 F (36.8 C)  TempSrc: Oral Oral Oral Oral  SpO2: 99% 97% 100% 100%  Weight:    59.1 kg  Height:        Intake/Output Summary (Last 24 hours) at 03/27/2024 1337 Last data filed at 03/27/2024 0615 Gross per 24 hour  Intake 105 ml  Output 1700 ml  Net -1595 ml   Filed Weights   03/24/24 0504 03/26/24 0636 03/27/24 9391  Weight: 61.3 kg 61.5 kg 59.1 kg    Examination:  General exam: NAD. Respiratory system: Lungs clear to auscultation bilaterally.  No wheezes, no crackles, no rhonchi.  Fair air movement.  Speaking in full sentences.   Cardiovascular system: RRR no murmurs rubs or gallops.  No JVD.  No pitting lower extremity edema.  Gastrointestinal system: Abdomen soft, nontender, nondistended, positive bowel sounds.  No rebound.  No guarding.  Percutaneous drain in place on the right with bilious drainage noted.  Central nervous system: Alert and oriented.  Bilateral knees wrapped.  Moving extremities spontaneously.  No focal neurological deficits. Extremities: Symmetric 5 x 5 power. Skin: No rashes, lesions or ulcers Psychiatry: Judgement and insight appear normal. Mood & affect appropriate.     Data Reviewed: I have personally reviewed following labs and imaging studies  CBC: Recent Labs  Lab 03/22/24 1303 03/23/24 0422 03/23/24 1735 03/24/24 0410 03/24/24 2134 03/25/24 0334 03/26/24 0406 03/27/24 0342  WBC 8.4 9.5  --  8.7 8.8 11.5* 10.4 10.2  NEUTROABS 5.4 6.1  --   --  6.7  --   --  7.0  HGB 7.9* 6.8*   < > 8.9* 9.6* 9.1* 9.2* 8.5*  HCT 22.8* 19.1*   < > 25.1* 27.2* 26.7* 26.6* 24.7*  MCV 90.1 88.8  --  87.5 88.9 90.8 89.3 90.8  PLT 264 252  --  241 241 235 251 250   < > = values in this interval not displayed.    Basic Metabolic Panel: Recent Labs  Lab 03/22/24 1303 03/23/24 0422 03/24/24 0410 03/24/24 2134 03/25/24 0334 03/26/24 0406 03/27/24 0342  NA 138   < > 138 137 138 135  138  K 3.9   < > 3.7 4.0 3.8 3.5 3.6  CL 103   < > 105 104 105 101 103  CO2 26   < > 23 23 25 24 25   GLUCOSE 99   < > 163* 166* 96 135* 107*  BUN 18   < > 16 17 15 17 21   CREATININE 2.03*   < > 1.91* 1.85* 1.80* 1.68* 1.76*  CALCIUM  9.4   < > 9.1 9.3 9.4 9.5 9.3  MG 1.7  --   --   --   --   --   --  PHOS 2.9  --   --   --   --   --   --    < > = values in this interval not displayed.    GFR: Estimated Creatinine Clearance: 25.7 mL/min (A) (by C-G formula based on SCr of 1.76 mg/dL (H)).  Liver Function Tests: Recent Labs  Lab 03/24/24 0410 03/24/24 2134 03/25/24 0334 03/26/24 0406 03/27/24 0342  AST 38 40 48* 41 36  ALT 60* 58* 55* 44 41  ALKPHOS 715* 731* 686* 613* 557*  BILITOT 3.3* 3.5* 3.9* 3.6* 3.5*  PROT 6.0* 6.6 6.4* 6.4* 6.3*  ALBUMIN  2.8* 3.0* 3.0* 2.9* 2.7*    CBG: No results for input(s): GLUCAP in the last 168 hours.   Recent Results (from the past 240 hours)  Aerobic/Anaerobic Culture w Gram Stain (surgical/deep wound)     Status: None   Collection Time: 03/17/24  5:58 PM   Specimen: BILE  Result Value Ref Range Status   Specimen Description   Final    BILE Performed at [facility], [address] [place], [place] [address]    Special Requests   Final    NONE Performed at [facility], [address], [place], [place] [address]    Gram Stain NO WBC SEEN NO ORGANISMS SEEN   Final   Culture   Final    No growth aerobically or anaerobically. Performed at [facility], [address] [place], [place] [address]    Report Status 03/22/2024 FINAL  Final  Culture, blood (x 2)     Status: None (Preliminary result)   Collection Time: 03/24/24  9:34 PM   Specimen: BLOOD RIGHT ARM  Result Value Ref Range Status   Specimen Description BLOOD RIGHT ARM  Final   Special Requests   Final    BOTTLES DRAWN AEROBIC ONLY Blood Culture adequate volume   Culture   Final    NO GROWTH 2 DAYS Performed at [facility], [address], [place], [place] [address]    Report Status PENDING  Incomplete  Culture, blood (x 2)     Status: None (Preliminary result)   Collection Time: 03/24/24  9:34 PM   Specimen: BLOOD LEFT ARM  Result Value Ref Range Status   Specimen Description   Final    BLOOD LEFT ARM Performed at [facility], [address] [place], [place] [address]    Special Requests   Final    BOTTLES DRAWN AEROBIC AND ANAEROBIC Blood Culture adequate volume Performed at [facility], [address] [place], [place] [address]    Culture   Final    NO GROWTH 2 DAYS Performed at [facility], [address] [place], [place] [address]    Report Status PENDING  Incomplete  Body fluid culture w Gram Stain     Status: None (Preliminary result)   Collection Time: 03/25/24  9:49 AM   Specimen: BILE; Body Fluid  Result Value Ref Range Status   Specimen Description   Final    BILE Performed at [facility], [address] [place], [place] [address]    Special Requests   Final    Normal Performed at [facility], [address] [place], [place] [address]    Gram Stain   Final    NO WBC SEEN FEW BUDDING YEAST SEEN RARE GRAM POSITIVE RODS    Culture   Final  MODERATE CANDIDA GLABRATA FEW ENTEROCOCCUS FAECALIS SUSCEPTIBILITIES TO FOLLOW Performed at [facility], [address], [place], [place] [address]    Report Status PENDING  Incomplete  Remove urinary catheter to obtain Clean Catch urine culture     Status: None   Collection Time: 03/25/24  9:51 PM   Specimen: Urine, Clean Catch  Result Value Ref Range Status   Specimen Description   Final    URINE, CLEAN CATCH Performed at [facility], [address] [place], [place] [address]    Special Requests   Final    NONE Performed at [facility], [address] [place], [place] [address]     Culture   Final    NO GROWTH Performed at [facility], [address], [place], [place] [address]    Report Status 03/27/2024 FINAL  Final  Body fluid culture w Gram Stain     Status: None (Preliminary result)   Collection Time: 03/26/24  4:36 PM   Specimen: Synovium; Body Fluid  Result Value Ref Range Status   Specimen Description   Final    SYNOVIAL RIGHT KNEE Performed at [facility], [address], [place], [place] [address]    Special Requests   Final    Normal Performed at [facility], [address], [place], [place] [address]    Gram Stain   Final    RARE WBC PRESENT, PREDOMINANTLY PMN NO ORGANISMS SEEN    Culture   Final    NO GROWTH < 24 HOURS Performed at [facility], [address] [place], [place] [address]    Report Status PENDING  Incomplete  Body fluid culture w Gram Stain     Status: None (Preliminary result)   Collection Time: 03/26/24  4:37 PM   Specimen: Synovium; Body Fluid  Result Value Ref Range Status   Specimen Description   Final    SYNOVIAL LEFT KNEE Performed at [facility], [address] [place], [place] [address]    Special Requests   Final    Normal Performed at [facility], [address] [place], [place] [address]    Gram Stain   Final    FEW WBC PRESENT, PREDOMINANTLY PMN NO ORGANISMS SEEN    Culture   Final    NO GROWTH < 24 HOURS Performed at [facility], [address], [place], [place] [address]    Report Status PENDING  Incomplete         Radiology Studies: No results found.      Scheduled Meds:  carbamide peroxide  5 drop Both EARS BID   Chlorhexidine  Gluconate Cloth  6 each Topical Daily   diclofenac  Sodium  2 g Topical TID   heparin  injection (subcutaneous)  5,000 Units Subcutaneous Q8H   latanoprost   1 drop Both Eyes Daily   senna  1 tablet Oral BID   sodium chloride  flush  5 mL Intracatheter Q8H    tamsulosin   0.4 mg Oral QPC supper   Continuous Infusions:  ampicillin -sulbactam (UNASYN ) IV 3 g (03/27/24 1321)     LOS: 12 days    Time spent: 35 minutes    Toribio Hummer, MD [facility]   To contact the attending provider between 7A-7P or the covering provider during after hours 7P-7A, please log into the web site [email or web address] and access using universal [place] password for that web site. If you  do not have the password, please call the hospital operator.  03/27/2024, 1:37 PM    "

## 2024-03-27 NOTE — Progress Notes (Signed)
 Mobility Specialist - Progress Note   03/27/24 1543  Mobility  Activity Ambulated with assistance  Level of Assistance Minimal assist, patient does 75% or more  Assistive Device Front wheel walker  Distance Ambulated (ft) 350 ft  Range of Motion/Exercises Active  Activity Response Tolerated well  Mobility visit 1 Mobility  Mobility Specialist Start Time (ACUTE ONLY) 1528  Mobility Specialist Stop Time (ACUTE ONLY) 1543  Mobility Specialist Time Calculation (min) (ACUTE ONLY) 15 min   Pt was found in bed and agreeable to mobilize. Min-A for bed mobility and sit>stand. C/o some neck pain. At EOS returned to recliner chair with all needs met. Family in room.   Erminio Leos,  Mobility Specialist Can be reached via Secure Chat

## 2024-03-27 NOTE — Progress Notes (Signed)
 Patient ID: Luis Chen, male   DOB: [birth year], 84 y.o.   MRN: [id number] The patient does report that he feels a little bit better after having fluid taken off of both knees.  The aspirations did not show any organisms thus far.  The white cell count in the right knee was only 530 white blood cells and the right knee was 19,700 white blood cells.  This is certainly more consistent with an inflammatory arthropathy.  He does have severe arthritis that is end-stage in the left knee.  We can certainly aspirate his knees again later for pain purposes and potentially even consider a steroid injection at some point in his knees.  Thus far, there is not a need for an urgent surgical intervention from an orthopedic standpoint.

## 2024-03-27 NOTE — Progress Notes (Signed)
" °   03/26/24 2101  Assess: MEWS Score  Temp (!) 101.9 F (38.8 C)  BP 124/60  MAP (mmHg) 79  Pulse Rate 99  Resp 20  SpO2 99 %  Assess: MEWS Score  MEWS Temp 2  MEWS Systolic 0  MEWS Pulse 0  MEWS RR 0  MEWS LOC 0  MEWS Score 2  MEWS Score Color Yellow  Assess: if the MEWS score is Yellow or Red  Were vital signs accurate and taken at a resting state? Yes  Does the patient meet 2 or more of the SIRS criteria? Yes  Does the patient have a confirmed or suspected source of infection? Yes  MEWS guidelines implemented  Yes, yellow  Treat  MEWS Interventions Considered administering scheduled or prn medications/treatments as ordered  Take Vital Signs  Increase Vital Sign Frequency  Yellow: Q2hr x1, continue Q4hrs until patient remains green for 12hrs  Escalate  MEWS: Escalate Yellow: Discuss with charge nurse and consider notifying provider and/or RRT  Notify: Charge Nurse/RN  Name of Charge Nurse/RN Notified Lauren, RN  Provider Notification  Provider Name/Title Isaiah Lever, NP  Date Provider Notified 03/26/24  Time Provider Notified 2143  Method of Notification Page  Notification Reason Other (Comment) (Temp 101.9)  Provider response See new orders  Date of Provider Response 03/26/24  Assess: SIRS CRITERIA  SIRS Temperature  1  SIRS Respirations  0  SIRS Pulse 1  SIRS WBC 0  SIRS Score Sum  2    "

## 2024-03-28 DIAGNOSIS — E43 Unspecified severe protein-calorie malnutrition: Secondary | ICD-10-CM

## 2024-03-28 DIAGNOSIS — M25561 Pain in right knee: Secondary | ICD-10-CM | POA: Diagnosis not present

## 2024-03-28 DIAGNOSIS — M25461 Effusion, right knee: Secondary | ICD-10-CM | POA: Diagnosis not present

## 2024-03-28 DIAGNOSIS — K831 Obstruction of bile duct: Secondary | ICD-10-CM | POA: Diagnosis not present

## 2024-03-28 DIAGNOSIS — C259 Malignant neoplasm of pancreas, unspecified: Secondary | ICD-10-CM | POA: Insufficient documentation

## 2024-03-28 DIAGNOSIS — M25462 Effusion, left knee: Secondary | ICD-10-CM | POA: Diagnosis not present

## 2024-03-28 DIAGNOSIS — M25562 Pain in left knee: Secondary | ICD-10-CM | POA: Diagnosis not present

## 2024-03-28 LAB — COMPREHENSIVE METABOLIC PANEL WITH GFR
ALT: 40 U/L (ref 0–44)
AST: 39 U/L (ref 15–41)
Albumin: 2.7 g/dL — ABNORMAL LOW (ref 3.5–5.0)
Alkaline Phosphatase: 607 U/L — ABNORMAL HIGH (ref 38–126)
Anion gap: 9 (ref 5–15)
BUN: 19 mg/dL (ref 8–23)
CO2: 25 mmol/L (ref 22–32)
Calcium: 9.2 mg/dL (ref 8.9–10.3)
Chloride: 105 mmol/L (ref 98–111)
Creatinine, Ser: 1.62 mg/dL — ABNORMAL HIGH (ref 0.61–1.24)
GFR, Estimated: 41 mL/min — ABNORMAL LOW
Glucose, Bld: 118 mg/dL — ABNORMAL HIGH (ref 70–99)
Potassium: 3.7 mmol/L (ref 3.5–5.1)
Sodium: 138 mmol/L (ref 135–145)
Total Bilirubin: 2.7 mg/dL — ABNORMAL HIGH (ref 0.0–1.2)
Total Protein: 6.3 g/dL — ABNORMAL LOW (ref 6.5–8.1)

## 2024-03-28 LAB — URINE CULTURE: Culture: NO GROWTH

## 2024-03-28 LAB — BODY FLUID CULTURE W GRAM STAIN
Gram Stain: NONE SEEN
Special Requests: NORMAL

## 2024-03-28 LAB — CBC
HCT: 21.9 % — ABNORMAL LOW (ref 39.0–52.0)
Hemoglobin: 7.7 g/dL — ABNORMAL LOW (ref 13.0–17.0)
MCH: 31.2 pg (ref 26.0–34.0)
MCHC: 35.2 g/dL (ref 30.0–36.0)
MCV: 88.7 fL (ref 80.0–100.0)
Platelets: 276 K/uL (ref 150–400)
RBC: 2.47 MIL/uL — ABNORMAL LOW (ref 4.22–5.81)
RDW: 17.2 % — ABNORMAL HIGH (ref 11.5–15.5)
WBC: 8.2 K/uL (ref 4.0–10.5)
nRBC: 0 % (ref 0.0–0.2)

## 2024-03-28 MED ORDER — SODIUM CHLORIDE 0.9 % IV SOLN: 200.0000 mg | INTRAVENOUS | Status: DC

## 2024-03-28 MED ORDER — GLUCERNA SHAKE PO LIQD
237.0000 mL | Freq: Three times a day (TID) | ORAL | Status: DC
  Administered 2024-03-28 – 2024-03-30 (×6): 237 mL via ORAL
  Filled 2024-03-28 (×8): qty 237

## 2024-03-28 NOTE — Progress Notes (Addendum)
 "   Progress Note   Subjective  Chief Complaint:Obstructive Jaundice associated with pancreatic mass and biliary obstruction, status post internal/external percutaneous biliary drain with IR and EUS with FNA on 03/24/2024 with biopsies positive for adenocarcinoma  Today, patient doing well, still tolerating his diet.  We discussed his diagnosis of pancreatic cancer.  Explained next steps.  Answered his questions.  No new complaints or concerns overnight.   Objective   Vital signs in last 24 hours: Temp:  [98.3 F (36.8 C)-99 F (37.2 C)] 99 F (37.2 C) (01/20 0541) Pulse Rate:  [83-91] 83 (01/20 0541) Resp:  [17-18] 17 (01/20 0541) BP: (124-138)/(58-75) 124/58 (01/20 0541) SpO2:  [97 %-100 %] 97 % (01/20 0541) Last BM Date : 03/26/24 General:    AA male in NAD Heart:  Regular rate and rhythm; no murmurs Lungs: Respirations even and unlabored, lungs CTA bilaterally Abdomen:  Soft, mild TTP near drain and nondistended. Normal bowel sounds.+ Biliary drain with 150 cc of green fluid Psych:  Cooperative. Normal mood and affect.  Intake/Output from previous day: 01/19 0701 - 01/20 0700 In: 106.5 [IV Piggyback:106.5] Out: 1400 [Urine:850; Drains:550]   Lab Results: Recent Labs    03/26/24 0406 03/27/24 0342 03/28/24 0358  WBC 10.4 10.2 8.2  HGB 9.2* 8.5* 7.7*  HCT 26.6* 24.7* 21.9*  PLT 251 250 276   BMET Recent Labs    03/26/24 0406 03/27/24 0342 03/28/24 0358  NA 135 138 138  K 3.5 3.6 3.7  CL 101 103 105  CO2 24 25 25   GLUCOSE 135* 107* 118*  BUN 17 21 19   CREATININE 1.68* 1.76* 1.62*  CALCIUM  9.5 9.3 9.2      Latest Ref Rng & Units 03/28/2024    3:58 AM 03/27/2024    3:42 AM 03/26/2024    4:06 AM  Hepatic Function  Total Protein 6.5 - 8.1 g/dL 6.3  6.3  6.4   Albumin  3.5 - 5.0 g/dL 2.7  2.7  2.9   AST 15 - 41 U/L 39  36  41   ALT 0 - 44 U/L 40  41  44   Alk Phosphatase 38 - 126 U/L 607  557  613   Total Bilirubin 0.0 - 1.2 mg/dL 2.7  3.5  3.6         Assessment / Plan:   Assessment: 1.  Pancreatic mass causing biliary obstruction:status post PTC with external/internal biliary drain, functioning well, EUS with FNA on 03/24/2024, cytology showed adenocarcinoma, transient fever responding to Unasyn  started 03/25/2024 -Discussed cytology results with the patient today.  Consulting oncology-spoke with Dr. Roderic and PA Thayil.. - Continue Unasyn  for 7 days  2.  Bilateral knee pain and swelling: Significant bilateral knee effusions, has occurred before, recently drained and thought inflammatory arthropathy, orthopedic physicians were following  Thank you for your kind consultation, we will likely sign off.   LOS: 13 days   Luis Chen  03/28/2024, 10:30 AM   Attending physician's note   I personally saw the patient and performed a substantive portion of the medical decision making process for this encounter (including a complete performance of the key components : MDM, Hx and Exam), in conjunction with the APP.  I agree with the APP's note, impression, and  the management plan for the number and complexity of problems addressed at the encounter for the patient and take responsibility for that plan with its inherent risk of complications, morbidity, or mortality with additional input as follows.  Pancreas FNA positive for adenocarcinoma.  Follow-up with oncology   Continue Unasyn  to complete 7-day course, can transition to oral Augmentin  on discharge Continues to have knee pain and swelling, is followed by Ortho, s/p aspiration of knee effusion inflammatory arthropathy  GI signing off, available if have any questions  The patient was provided an opportunity to ask questions and all were answered. The patient agreed with the plan and demonstrated an understanding of the instructions.   LOIS Wilkie Mcgee , MD [phone]       "

## 2024-03-28 NOTE — Progress Notes (Signed)
 " PROGRESS NOTE    Luis Chen  FMW:[id number] DOB: Apr 14, 1940 DOA: 03/14/2024 PCP: [name], MD    Chief Complaint  Patient presents with   Hypotension    Brief Narrative:  Luis Chen is a 84 y.o. male with PMH of CKD stage III, CAD, MGUS, HLD who presented with lightheadedness and low blood pressure, and weight loss over the last few weeks. Blood work in the ED showed elevated LFTs, significantly elevated alkaline phosphatase and bilirubin. CT abdomen/pelvis showed concern for pancreatic mass , labs showed AKI.  Gastroenterology and nephrology IR has been consulted.   S/p ERCP but unable to pass guidewire, now s/p perc biliary drain by IR.  Patient subsequently underwent a EUS with biopsies positive for pancreatic adenocarcinoma.  Oncology consulted.     Assessment & Plan:   Principal Problem:   Obstructive jaundice (HCC) Active Problems:   Anemia, chronic disease   Chronic kidney disease (CKD), stage III (moderate) (HCC)   MGUS (monoclonal gammopathy of unknown significance)   Coronary artery disease involving native coronary artery of native heart with other form of angina pectoris   Transaminitis   Pancreatic mass   Elevated bilirubin   Abnormal CT of the abdomen   Elevated liver enzymes   Protein-calorie malnutrition, severe   Pancreatic adenocarcinoma (HCC)  #1 obstructive jaundice secondary to pancreatic head mass/pancreatic adenocarcinoma -MRCP done with concerns for pancreatic head mass associated with severe dorsal pancreatic duct and biliary dilatation. - Patient seen in consultation by GI and underwent ERCP however unable to pass guidewire and IR consulted for percutaneous drain placement which was done 03/17/2024. -Percutaneous drain with output of 550 cc over the past 24 hours. - CA 19-9 noted to be elevated at 2010 - Cytology done with no malignant cells identified. - LFTs trending down. - Patient was on IV Zosyn  which was discontinued per GI  recommendations. - Patient being followed by GI and patient s/p EUS and FNA, 03/24/2024 with biopsy consistent with adenocarcinoma.  -Oncology consulted.  GI. -Patient noted to have developed a fever 03/24/2024 and subsequently placed empirically on IV Unasyn  to complete a 7-day course per GI recommendations. -Blood cultures pending with no growth to date. -Per GI.  2.  AKI on CKD stage IIIa - Baseline creatinine noted at 1.3-1.5 in 2024 and October 2025. - CT done with no hydronephrosis and bladder thickening possible due to bladder outlet obstruction. - AKI felt likely secondary to volume depletion and bladder outlet obstruction. - Foley catheter discontinued on 03/25/2024 as patient noted to have fevers. -Patient noted to fail voiding trial and Foley catheter placed back in, 03/26/2024. -Urine output of 850 cc over the past 24 hours.  - Patient was hydrated with IV fluids with renal function fluctuating and creatinine currently at 1.62.  - Status post transfusion 2 units PRBCs. -Monitor urine output. - Nephrology was following and feel patient will need urology outpatient follow-up.  3.  Metabolic acidosis -Resolved.  4.  MGUS with normocytic anemia - Patient with no overt bleeding. - Status posttransfusion 2 units PRBCs with hemoglobin currently at 7.7 from 8.5 from 9.2 from 6.8 (03/23/2024).SABRA -GI following. - Transfuse for hemoglobin < 7.  5.  CAD -Antiplatelets on hold and resume once cleared by GI and post procedures.  6.  Glaucoma - Eyedrops.   7.  Fever -Patient noted on 03/24/2024 to have a fever with a temp as high as 102.6.   - Leukocytosis noted to trend up transiently and  started to trend back down.  - Patient with EUS with instrumentation and biopsies taken 03/24/2024. - Blood cultures obtained 03/24/2024 with no growth to date.  -Urinalysis obtained with negative leukocytes, nitrite negative, 0-5 WBCs. -Urine culture is negative. -Cultures obtained from bile 03/25/2024  with moderate Candida glabrata, few Enterococcus faecalis, unsure of significance as patient afebrile, normal WBCs, fever curve trended down on empiric IV Unasyn .  - Patient underwent voiding trial 03/25/2024 which he failed and Foley catheter placed back in.  - Lipase levels obtained per GI noted to be elevated at 115 however patient with no abdominal pain and tolerating oral intake. - Continue empiric IV Unasyn  to complete a 7-day course per GI recommendations.   8.  Urinary retention -Foley catheter removed 03/25/2024, however patient with difficulty emptying out his bladder intermittently. - Patient noted to have continued urinary retention and Foley catheter had to be placed back in the evening of 03/26/2024. - Continue Flomax .   - Will need to be discharged with Foley catheter in place with outpatient follow-up with urology.   9.  Bilateral knee swelling and pain -Patient noted with bilateral swelling of his knees L > right. - Patient noted with prior history of pyogenic arthritis in 2017 requiring antibiotics. - Orthopedics consulted and patient seen by Bertrum Gaskins, PA and patient underwent aspiration of bilateral knees with left knee 50 cc of synovial slightly turbid fluid obtained, right knee with yellow slightly turbid fluid with 30 cc obtained and sent for cell count and cultures. -Cultures with no growth so far to date. -White cell count noted on the right knee with 530, white count noted on the left knee with 19,740, no crystals seen. -Orthopedics feel more consistent with inflammatory arthropathy. - Patient currently empirically on IV Unasyn . - Appreciate orthopedic input and recommendations.  10.  Severe protein calorie malnutrition -Likely secondary to newly diagnosed pancreatic adenocarcinoma. - Nutritional supplementation.     DVT prophylaxis: Heparin  Code Status: Full Family Communication: Updated patient, family friend at bedside. Disposition: Likely home when  clinically improved and cleared by GI and oncology.  Status is: Inpatient Remains inpatient appropriate because: Severity of illness   Consultants:  Gastroenterology: Dr. Abran 03/16/2024 Nephrology: Dr. Vassie 03/16/2024 Interventional radiology: Dr.Mugweru 03/17/2024 Orthopedics 03/26/2024 Orthopedics: Dr. Vernetta 03/26/2024 Oncology  Procedures:  CT abdomen and pelvis 03/14/2024 MRCP 03/15/2024 ERCP 03/17/2024 by Dr. Abran Percutaneous transhepatic biliary tube placement anterograde cholangiogram by IR: Dr.Mugweru 03/17/2024 Transfusion 2 units PRBCs 03/23/2024 EUS per GI: Dr. Rollin 03/24/2024 Bilateral knee aspiration per orthopedics 03/26/2024.  Antimicrobials:  Anti-infectives (From admission, onward)    Start     Dose/Rate Route Frequency Ordered Stop   03/28/24 1515  micafungin (MYCAMINE) 200 mg in sodium chloride  0.9 % 100 mL IVPB  Status:  Discontinued        200 mg 110 mL/hr over 1 Hours Intravenous Every 24 hours 03/28/24 1428 03/28/24 1428   03/25/24 1400  Ampicillin -Sulbactam (UNASYN ) 3 g in sodium chloride  0.9 % 100 mL IVPB  Status:  Discontinued        3 g 200 mL/hr over 30 Minutes Intravenous Every 6 hours 03/25/24 1232 03/25/24 1350   03/25/24 1400  Ampicillin -Sulbactam (UNASYN ) 3 g in sodium chloride  0.9 % 100 mL IVPB        3 g 200 mL/hr over 30 Minutes Intravenous Every 12 hours 03/25/24 1350     03/17/24 1615  cefTRIAXone  (ROCEPHIN ) 2 g in sodium chloride  0.9 % 100 mL IVPB  2 g 200 mL/hr over 30 Minutes Intravenous  Once 03/17/24 1517 03/20/24 1256   03/15/24 2000  piperacillin -tazobactam (ZOSYN ) IVPB 2.25 g  Status:  Discontinued        2.25 g 100 mL/hr over 30 Minutes Intravenous Every 8 hours 03/15/24 1555 03/21/24 1129   03/15/24 0800  piperacillin -tazobactam (ZOSYN ) IVPB 3.375 g  Status:  Discontinued        3.375 g 12.5 mL/hr over 240 Minutes Intravenous Every 12 hours 03/15/24 0520 03/15/24 1555   03/15/24 0600  piperacillin -tazobactam (ZOSYN ) IVPB 2.25 g   Status:  Discontinued       Placed in Followed by Linked Group   2.25 g 100 mL/hr over 30 Minutes Intravenous Every 6 hours 03/14/24 2217 03/15/24 0519   03/14/24 2230  piperacillin -tazobactam (ZOSYN ) IVPB 3.375 g       Placed in Followed by Linked Group   3.375 g 100 mL/hr over 30 Minutes Intravenous  Once 03/14/24 2217 03/15/24 9394         Subjective:  Patient sitting up in recliner. Denies any chest pain or shortness of breath. Denies any abdominal pain. Tolerating current diet. States bilateral knee pain has improved. Stated ambulated some in the hallway. Oncology at bedside.  Objective: Vitals:   03/27/24 1500 03/27/24 2022 03/28/24 0541 03/28/24 1346  BP: 138/75 134/62 (!) 124/58 136/65  Pulse: 89 91 83 80  Resp:  18 17 20   Temp: 98.4 F (36.9 C) 98.3 F (36.8 C) 99 F (37.2 C) 98.6 F (37 C)  TempSrc: Oral Oral Oral Oral  SpO2: 100% 100% 97% 99%  Weight:      Height:        Intake/Output Summary (Last 24 hours) at 03/28/2024 1618 Last data filed at 03/28/2024 1000 Gross per 24 hour  Intake 106.5 ml  Output 1550 ml  Net -1443.5 ml   Filed Weights   03/24/24 0504 03/26/24 0636 03/27/24 0608  Weight: 61.3 kg 61.5 kg 59.1 kg    Examination:  General exam: NAD. Respiratory system: CTAB.  No wheezes, no crackles, no rhonchi.  Fair air movement.  Speaking in full sentences.  Cardiovascular system: Regular rate rhythm no murmurs rubs or gallops.  No JVD.  No lower extremity edema.  Gastrointestinal system: Abdomen is soft, nontender, nondistended, positive bowel sounds.  No rebound.  No guarding.  Peritoneal drain in place on the right with bilious drainage noted.  Central nervous system: Alert and oriented.  Bilateral knees wrapped.  Moving extremities spontaneously.  No focal neurological deficits. Extremities: Bilateral knees wrapped.  Symmetric 5 x 5 power. Skin: No rashes, lesions or ulcers Psychiatry: Judgement and insight appear normal. Mood & affect  appropriate.     Data Reviewed: I have personally reviewed following labs and imaging studies  CBC: Recent Labs  Lab 03/22/24 1303 03/23/24 0422 03/23/24 1735 03/24/24 2134 03/25/24 0334 03/26/24 0406 03/27/24 0342 03/28/24 0358  WBC 8.4 9.5   < > 8.8 11.5* 10.4 10.2 8.2  NEUTROABS 5.4 6.1  --  6.7  --   --  7.0  --   HGB 7.9* 6.8*   < > 9.6* 9.1* 9.2* 8.5* 7.7*  HCT 22.8* 19.1*   < > 27.2* 26.7* 26.6* 24.7* 21.9*  MCV 90.1 88.8   < > 88.9 90.8 89.3 90.8 88.7  PLT 264 252   < > 241 235 251 250 276   < > = values in this interval not displayed.    Basic Metabolic Panel: Recent  Labs  Lab 03/22/24 1303 03/23/24 0422 03/24/24 2134 03/25/24 0334 03/26/24 0406 03/27/24 0342 03/28/24 0358  NA 138   < > 137 138 135 138 138  K 3.9   < > 4.0 3.8 3.5 3.6 3.7  CL 103   < > 104 105 101 103 105  CO2 26   < > 23 25 24 25 25   GLUCOSE 99   < > 166* 96 135* 107* 118*  BUN 18   < > 17 15 17 21 19   CREATININE 2.03*   < > 1.85* 1.80* 1.68* 1.76* 1.62*  CALCIUM  9.4   < > 9.3 9.4 9.5 9.3 9.2  MG 1.7  --   --   --   --   --   --   PHOS 2.9  --   --   --   --   --   --    < > = values in this interval not displayed.    GFR: Estimated Creatinine Clearance: 27.9 mL/min (A) (by C-G formula based on SCr of 1.62 mg/dL (H)).  Liver Function Tests: Recent Labs  Lab 03/24/24 2134 03/25/24 0334 03/26/24 0406 03/27/24 0342 03/28/24 0358  AST 40 48* 41 36 39  ALT 58* 55* 44 41 40  ALKPHOS 731* 686* 613* 557* 607*  BILITOT 3.5* 3.9* 3.6* 3.5* 2.7*  PROT 6.6 6.4* 6.4* 6.3* 6.3*  ALBUMIN  3.0* 3.0* 2.9* 2.7* 2.7*    CBG: No results for input(s): GLUCAP in the last 168 hours.   Recent Results (from the past 240 hours)  Culture, blood (x 2)     Status: None (Preliminary result)   Collection Time: 03/24/24  9:34 PM   Specimen: BLOOD RIGHT ARM  Result Value Ref Range Status   Specimen Description BLOOD RIGHT ARM  Final   Special Requests   Final    BOTTLES DRAWN AEROBIC ONLY  Blood Culture adequate volume   Culture   Final    NO GROWTH 3 DAYS Performed at [facility], [address], [place], [place] [address]    Report Status PENDING  Incomplete  Culture, blood (x 2)     Status: None (Preliminary result)   Collection Time: 03/24/24  9:34 PM   Specimen: BLOOD LEFT ARM  Result Value Ref Range Status   Specimen Description   Final    BLOOD LEFT ARM Performed at [facility], [address] [place], [place] [address]    Special Requests   Final    BOTTLES DRAWN AEROBIC AND ANAEROBIC Blood Culture adequate volume Performed at [facility], [address] [place], [place] [address]    Culture   Final    NO GROWTH 3 DAYS Performed at [facility], [address] [place], [place] [address]    Report Status PENDING  Incomplete  Body fluid culture w Gram Stain     Status: None   Collection Time: 03/25/24  9:49 AM   Specimen: BILE; Body Fluid  Result Value Ref Range Status   Specimen Description   Final    BILE Performed at [facility], [address], [place], [place] [address]    Special Requests   Final    Normal Performed at [facility], [address], [place], [place] [address]    Gram Stain   Final    NO WBC SEEN FEW BUDDING YEAST SEEN RARE GRAM POSITIVE RODS Performed at [facility], [address], [place], [place] [address]    Culture   Final    MODERATE CANDIDA GLABRATA FEW ENTEROCOCCUS FAECALIS    Report Status 03/28/2024 FINAL  Final   Organism ID, Bacteria ENTEROCOCCUS FAECALIS  Final      Susceptibility   Enterococcus faecalis - MIC*    AMPICILLIN  <=2 SENSITIVE Sensitive     VANCOMYCIN  1 SENSITIVE Sensitive     GENTAMICIN SYNERGY SENSITIVE Sensitive     * FEW ENTEROCOCCUS FAECALIS  Remove urinary catheter to obtain Clean Catch urine culture     Status: None   Collection Time: 03/25/24  9:51 PM   Specimen: Urine, Clean Catch   Result Value Ref Range Status   Specimen Description   Final    URINE, CLEAN CATCH Performed at [facility], [address], [place], [place] [address]    Special Requests   Final    NONE Performed at [facility], [address] [place], [place] [address]    Culture   Final    NO GROWTH Performed at [facility], [address] [place], [place] [address]    Report Status 03/27/2024 FINAL  Final  Urine Culture     Status: None   Collection Time: 03/26/24 12:14 AM   Specimen: Urine, Catheterized  Result Value Ref Range Status   Specimen Description   Final    URINE, CATHETERIZED Performed at [facility], [address], [place], [place] [address]    Special Requests   Final    NONE Performed at [facility], [address], [place], [place] [address]    Culture   Final    NO GROWTH Performed at [facility], [address], [place], [place] [address]    Report Status 03/28/2024 FINAL  Final  Body fluid culture w Gram Stain     Status: None (Preliminary result)   Collection Time: 03/26/24  4:36 PM   Specimen: Synovium; Body Fluid  Result Value Ref Range Status   Specimen Description   Final    SYNOVIAL RIGHT KNEE Performed at [facility], [address] [place], [place] [address]    Special Requests   Final    Normal Performed at [facility], [address] [place], [place] [address]    Gram Stain   Final    RARE WBC PRESENT, PREDOMINANTLY PMN NO ORGANISMS SEEN    Culture   Final    NO GROWTH 2 DAYS Performed at [facility], [address], [place], [place] [address]    Report Status PENDING  Incomplete  Body fluid culture w Gram Stain     Status: None (Preliminary result)   Collection Time: 03/26/24  4:37 PM   Specimen: Synovium; Body Fluid  Result Value Ref Range Status   Specimen Description   Final    SYNOVIAL LEFT  KNEE Performed at [facility], [address] [place], [place] [address]    Special Requests   Final    Normal Performed at [facility], [address], [place], [place] [address]    Gram Stain   Final    FEW WBC PRESENT, PREDOMINANTLY PMN NO ORGANISMS SEEN    Culture   Final    NO GROWTH 2 DAYS Performed at [facility], [address] [place], [place] [address]    Report Status PENDING  Incomplete  Radiology Studies: No results found.      Scheduled Meds:  carbamide peroxide  5 drop Both EARS BID   Chlorhexidine  Gluconate Cloth  6 each Topical Daily   diclofenac  Sodium  2 g Topical TID   feeding supplement (GLUCERNA SHAKE)  237 mL Oral TID BM   heparin  injection (subcutaneous)  5,000 Units Subcutaneous Q8H   latanoprost   1 drop Both Eyes Daily   senna  1 tablet Oral BID   sodium chloride  flush  5 mL Intracatheter Q8H   tamsulosin   0.4 mg Oral QPC supper   Continuous Infusions:  ampicillin -sulbactam (UNASYN ) IV 3 g (03/28/24 1517)     LOS: 13 days    Time spent: 40 minutes    Toribio Hummer, MD [facility]   To contact the attending provider between 7A-7P or the covering provider during after hours 7P-7A, please log into the web site [email or web address] and access using universal [place] password for that web site. If you do not have the password, please call the hospital operator.  03/28/2024, 4:18 PM    "

## 2024-03-28 NOTE — Progress Notes (Addendum)
 Initial Nutrition Assessment  DOCUMENTATION CODES:   Severe malnutrition in context of acute illness/injury  INTERVENTION:   -Glucerna Shake po TID, each supplement provides 220 kcal and 10 grams of protein   -Placed High Calorie, High Protein handout in AVS  NUTRITION DIAGNOSIS:   Severe Malnutrition related to acute illness (new pancreatic cancer) as evidenced by percent weight loss, severe fat depletion, severe muscle depletion.  GOAL:   Patient will meet greater than or equal to 90% of their needs  MONITOR:   PO intake, Supplement acceptance  REASON FOR ASSESSMENT:   Consult Assessment of nutrition requirement/status  ASSESSMENT:   84 y.o. male with PMH of CKD stage III, CAD, MGUS, HLD who presented with lightheadedness and low blood pressure, and weight loss over the last few weeks. Blood work in the ED showed elevated LFTs, significantly elevated alkaline phosphatase and bilirubin. CT abdomen/pelvis showed concern for pancreatic mass.  Patient in room, family at bedside. Pt reports eating well and liking the food here. Pt states he eats at home but is provided Suplena shakes through the [employer]. Has not noticed these have helped him gain weight. Suplena is a high calorie, renal friendly, carb controlled option with 10g of protein. Pt would like something similar. Open to trying Glucerna or Ensure Max shakes. Will also provide high calorie, high protein information given pt's malnutrition and new cancer diagnosis. Pt denies any issues swallowing, chewing or with taste.   Pt reports his UBW ~152-156 lbs. Per weight records, pt has  lost 13 lbs since 1/7 (9% wt loss x 2 weeks, significant for time frame).  Medications: Senokot  Labs reviewed.  NUTRITION - FOCUSED PHYSICAL EXAM:  Flowsheet Row Most Recent Value  Orbital Region Severe depletion  Upper Arm Region Severe depletion  Thoracic and Lumbar Region Unable to assess  Buccal Region Severe depletion  Temple Region  Severe depletion  Clavicle Bone Region Severe depletion  Clavicle and Acromion Bone Region Severe depletion  Scapular Bone Region Severe depletion  Dorsal Hand Severe depletion  Patellar Region Unable to assess  Anterior Thigh Region Unable to assess  Posterior Calf Region Unable to assess  Edema (RD Assessment) None  Hair Unable to assess  [covered]  Eyes Reviewed  Mouth Reviewed  Skin Reviewed  Nails Reviewed    Diet Order:   Diet Order             Diet regular Room service appropriate? Yes; Fluid consistency: Thin  Diet effective now                   EDUCATION NEEDS:   Education needs have been addressed  Skin:  Skin Assessment: Reviewed RN Assessment  Last BM:  1/19 -type 4  Height:   Ht Readings from Last 1 Encounters:  03/15/24 5' 10 (1.778 m)    Weight:   Wt Readings from Last 1 Encounters:  03/27/24 59.1 kg    BMI:  Body mass index is 18.69 kg/m.  Estimated Nutritional Needs:   Kcal:  1800-2000  Protein:  90-105g  Fluid:  2L/day   Morna Lee, MS, RD, LDN Inpatient Clinical Dietitian Contact via Secure chat

## 2024-03-28 NOTE — Consult Note (Cosign Needed)
 [facility] CONSULT NOTE  Patient Care Team: [name], MD as PCP - General (Family Medicine) Rolan Ezra RAMAN, MD as PCP - Advanced Heart Failure (Cardiology) Lavona Agent, MD as PCP - Cardiology (Cardiology) Beverley Evalene BIRCH, MD as Attending Physician (Orthopedic Surgery) Rayburn Pac, MD (Inactive) as Consulting Physician (Nephrology) Marci Dorisann PARAS, MD (Hematology) Lavona Agent, MD as Consulting Physician (Cardiology) [facility] Bonni Lien as Consulting Physician Nicholaus Sherlean CROME, [facility] (Inactive) (Pharmacist) Federico Norleen ONEIDA MADISON, MD as Consulting Physician (Hematology and Oncology)  CHIEF COMPLAINTS/PURPOSE OF CONSULTATION:  Pancreatic adenocarcinoma with history of MGUS  REFERRING PHYSICIAN: Dr. Toribio Hummer  HISTORY OF PRESENTING ILLNESS:  Luis Chen 84 y.o. male presented on 03/15/2024 with complaints of weight loss and increasing lightheadedness.  Patient has been seen in the past in [facility] for MGUS.  Patient reports that he has lost over 15 pounds in the last few weeks. Imaging done this admission shows pancreatic head mass suspicious for pancreatic adenocarcinoma, therefore oncology evaluation has been requested. Denies chest pain, shortness of breath or other respiratory issues.  Denies blood in stool or urine. Family at bedside.  Reports feeling better today and ambulated in hallway with PT.  Patient wonders about next step in treatment planning and acknowledges he wants to be treated for new oncologic diagnosis.     I have reviewed his chart and materials related to his cancer extensively and collaborated history with the patient. Summary of oncologic history is as follows: Oncology History   No problem history exists.    ASSESSMENT & PLAN:  Pancreatic adenocarcinoma, newly diagnosed - MR abdomen done 03/15/2024 showed 3.3 x 2.3 x 2.1 cm pancreatic head mass suspicious for pancreatic adenocarcinoma. - Cytology  03/24/2024 confirmed adenocarcinoma. - Medical oncology/Dr. Federico will make further treatment recommendations.  Hyperbilirubinemia Obstructive jaundice - Total bili 2.7 - Secondary to malignancy - Status post percutaneous biliary drain. - GI following - Monitor CMP closely  History of IgG kappa MGUS - Patient seen in outpatient [facility] on 12/31/2023.  Labs reviewed at that time showed M protein was stable.  There was no signs or symptoms of transformation to active myeloma. - Plan at that time was to return 06/2024 for further evaluation. - Medical oncology/Dr. Federico and Johnston Police, PA following closely  Anemia - Hemoglobin low 7.7 - Likely multifactorial, related to CKD and malignancy - Recommend PRBC transfusion for hemoglobin <7.0 - Continue to monitor CBC with differential  CKD - Elevated creatinine and decreased GFR.  At baseline. - Avoid nephrotoxic agents - Continue to monitor renal function   MEDICAL HISTORY:  Past Medical History:  Diagnosis Date   Anemia, chronic disease 07/01/2015   Angina decubitus    Benign essential hypertension 10/31/2008   Qualifier: Diagnosis of  By: Lavona, MD, CODY Agent     CAD (coronary artery disease)    non obstructive. Left main normal. LAd proximal long 25% stenosis, termingating as focal 50% prox to mid lesion. First & second diag were small, normal. Circumflex in proximal av groove had luminal irregularities.. Was large mid obtuse marg which was branching, scattered luminal irregul. Infer branch did supply some septal perforators. Was PDA,small & normal.  < 1.4mm. There was prox 95% stenosis   CARDIOMYOPATHY 11/27/2008   CKD (chronic kidney disease)    Hyperlipidemia    transient history, controlled on diet   Hypertension    Mixed hyperlipidemia 10/30/2008   Qualifier: Diagnosis of  By: Leana, CNA, Bari  Non-traumatic rhabdomyolysis 07/03/2015   Polyarthropathy 07/01/2015   Primary osteoarthritis of knees, bilateral 12/10/2014    Pyogenic arthritis of left knee joint (HCC)    Swelling of left knee joint 07/01/2015   Systolic HF (heart failure) (HCC) 02/08/2018    SURGICAL HISTORY: Past Surgical History:  Procedure Laterality Date   CORONARY ARTERY BYPASS GRAFT N/A 02/17/2018   Procedure: CORONARY ARTERY BYPASS GRAFTING (CABG);  Surgeon: Lucas Dorise POUR, MD;  Location: [facility] OR;  Service: Open Heart Surgery;  Laterality: N/A;  Times 3 using endoscopically harvested right saphenous vein.     ERCP N/A 03/17/2024   Procedure: ERCP, WITH INTERVENTION IF INDICATED;  Surgeon: Abran Norleen SAILOR, MD;  Location: [facility];  Service: Gastroenterology;  Laterality: N/A;   ESOPHAGOGASTRODUODENOSCOPY N/A 03/24/2024   Procedure: EGD (ESOPHAGOGASTRODUODENOSCOPY);  Surgeon: Rollin Dover, MD;  Location: [facility];  Service: Gastroenterology;  Laterality: N/A;   EUS N/A 03/24/2024   Procedure: ULTRASOUND, UPPER GI TRACT, ENDOSCOPIC;  Surgeon: Rollin Dover, MD;  Location: [facility];  Service: Gastroenterology;  Laterality: N/A;   FINE NEEDLE ASPIRATION  03/24/2024   Procedure: FINE NEEDLE ASPIRATION;  Surgeon: Rollin Dover, MD;  Location: [facility];  Service: Gastroenterology;;   IR INT EXT BILIARY DRAIN WITH CHOLANGIOGRAM  03/17/2024   KNEE ARTHROSCOPY Left 07/01/2015   Procedure: Irrigation and debridement left knee arthroscopy;  Surgeon: Evalene JONETTA Chancy, MD;  Location: [facility] OR;  Service: Orthopedics;  Laterality: Left;   RIGHT/LEFT HEART CATH AND CORONARY ANGIOGRAPHY N/A 02/08/2018   Procedure: RIGHT/LEFT HEART CATH AND CORONARY ANGIOGRAPHY;  Surgeon: Dann Candyce RAMAN, MD;  Location: [facility] INVASIVE CV LAB;  Service: Cardiovascular;  Laterality: N/A;   TEE WITHOUT CARDIOVERSION N/A 02/17/2018   Procedure: TRANSESOPHAGEAL ECHOCARDIOGRAM (TEE);  Surgeon: Lucas Dorise POUR, MD;  Location: [facility] OR;  Service: Open Heart Surgery;  Laterality: N/A;    SOCIAL HISTORY: Social History   Socioeconomic History   Marital status: Widowed     Spouse name: Not on file   Number of children: Not on file   Years of education: Not on file   Highest education level: Not on file  Occupational History   Not on file  Tobacco Use   Smoking status: Never   Smokeless tobacco: Never  Vaping Use   Vaping status: Never Used  Substance and Sexual Activity   Alcohol use: Yes    Comment: one glass of wine or beer occassionaly   Drug use: No   Sexual activity: Yes  Other Topics Concern   Not on file  Social History Narrative   Lives with grandson (17 yo), wife passed on 04-04-2000. 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    Enjoys bowling- Is in a bowling league    02/11/2018 @ 1352 Pt discharged home with niece. No s/s of distress noted. Education completed, discharge instructions provided and understanding verbalized. Iv sites removed *2. Occlusive drsg per procedure-cdi. No c/o pain/nor distress.   Social Drivers of Health   Tobacco Use: Low Risk (03/17/2024)   Patient History    Smoking Tobacco Use: Never    Smokeless Tobacco Use: Never    Passive Exposure: Not on file  Financial Resource Strain: Low Risk (04/01/2023)   Overall Financial Resource Strain (CARDIA)    Difficulty of Paying Living Expenses: Not hard at all  Food Insecurity: No Food Insecurity (03/15/2024)   Epic    Worried About Radiation Protection Practitioner of Food in the Last Year:  Never true    Ran Out of Food in the Last Year: Never true  Transportation Needs: Unmet Transportation Needs (03/15/2024)   Epic    Lack of Transportation (Medical): Yes    Lack of Transportation (Non-Medical): No  Physical Activity: Sufficiently Active (04/01/2023)   Exercise Vital Sign    Days of Exercise per Week: 5 days    Minutes of Exercise per Session: 30 min  Stress: No Stress Concern Present (04/01/2023)   [employer] of Occupational Health - Occupational Stress Questionnaire    Feeling of Stress : Not at all  Social Connections: Moderately  Integrated (03/15/2024)   Social Connection and Isolation Panel    Frequency of Communication with Friends and Family: More than three times a week    Frequency of Social Gatherings with Friends and Family: More than three times a week    Attends Religious Services: More than 4 times per year    Active Member of [employer] or Organizations: Yes    Attends Banker Meetings: 1 to 4 times per year    Marital Status: Widowed  Intimate Partner Violence: Not At Risk (03/15/2024)   Epic    Fear of Current or Ex-Partner: No    Emotionally Abused: No    Physically Abused: No    Sexually Abused: No  Depression (PHQ2-9): Low Risk (04/01/2023)   Depression (PHQ2-9)    PHQ-2 Score: 0  Alcohol Screen: Not on file  Housing: Low Risk (03/15/2024)   Epic    Unable to Pay for Housing in the Last Year: No    Number of Times Moved in the Last Year: 0    Homeless in the Last Year: No  Utilities: Not At Risk (03/15/2024)   Epic    Threatened with loss of utilities: No  Health Literacy: Adequate Health Literacy (04/01/2023)   B1300 Health Literacy    Frequency of need for help with medical instructions: Never    FAMILY HISTORY: Family History  Problem Relation Age of Onset   Diabetes Mother    Arthritis Mother    Heart disease Mother    Hyperlipidemia Mother    Hypertension Mother    Heart attack Mother 22   Heart disease Brother        had in his 29s. Had CABG x2   Hypothyroidism Daughter    Stroke Daughter      PHYSICAL EXAMINATION: ECOG PERFORMANCE STATUS: 2 - Symptomatic, <50% confined to bed  Vitals:   03/30/24 0531 03/30/24 0739  BP: 132/68 (!) 112/55  Pulse: 94 82  Resp: 18 (!) 22  Temp: 98.3 F (36.8 C) 98.9 F (37.2 C)  SpO2: 100% 99%   Filed Weights   03/27/24 0608 03/29/24 0500 03/30/24 0500  Weight: 130 lb 4.7 oz (59.1 kg) 136 lb 0.4 oz (61.7 kg) 141 lb 8.6 oz (64.2 kg)    GENERAL: alert, no distress and comfortable +cachectic SKIN: skin color, texture, turgor are  normal, no rashes or significant lesions EYES: normal, conjunctiva are pink and non-injected, sclera clear OROPHARYNX: no exudate, no erythema and lips, buccal mucosa, and tongue normal  NECK: supple, thyroid  normal size, non-tender, without nodularity LYMPH: no palpable lymphadenopathy in the cervical, axillary or inguinal LUNGS: clear to auscultation and percussion with normal breathing effort HEART: regular rate & rhythm and no murmurs and no lower extremity edema ABDOMEN: abdomen soft, non-tender and normal bowel sounds MUSCULOSKELETAL: no cyanosis of digits and no clubbing  PSYCH: alert & oriented x 3  with fluent speech NEURO: no focal motor/sensory deficits   ALLERGIES:  is allergic to ace inhibitors and entresto  [sacubitril-valsartan].  MEDICATIONS:  Current Facility-Administered Medications  Medication Dose Route Frequency Provider Last Rate Last Admin   acetaminophen  (TYLENOL ) tablet 650 mg  650 mg Oral Q6H PRN Abran Norleen SAILOR, MD   650 mg at 03/28/24 2153   Or   acetaminophen  (TYLENOL ) suppository 650 mg  650 mg Rectal Q6H PRN Abran Norleen SAILOR, MD       Ampicillin -Sulbactam (UNASYN ) 3 g in sodium chloride  0.9 % 100 mL IVPB  3 g Intravenous Q12H Utomwen, Adesuwa, RPH 200 mL/hr at 03/30/24 0232 Restarted at 03/30/24 0232   carbamide peroxide (DEBROX) 6.5 % OTIC (EAR) solution 5 drop  5 drop Both EARS BID Sebastian Toribio GAILS, MD   5 drop at 03/29/24 2124   Chlorhexidine  Gluconate Cloth 2 % PADS 6 each  6 each Topical Daily Caleen Colander, MD   6 each at 03/29/24 1100   diclofenac  Sodium (VOLTAREN ) 1 % topical gel 2 g  2 g Topical TID Sebastian Toribio GAILS, MD   2 g at 03/29/24 2124   feeding supplement (GLUCERNA SHAKE) (GLUCERNA SHAKE) liquid 237 mL  237 mL Oral TID BM Sebastian Toribio GAILS, MD   237 mL at 03/29/24 2100   heparin  injection 5,000 Units  5,000 Units Subcutaneous Q8H Rollin Dover, MD   5,000 Units at 03/30/24 9372   iohexol  (OMNIPAQUE ) 300 MG/ML solution 50 mL  50 mL Per Tube Once  PRN Hughes Simmonds, MD       iohexol  (OMNIPAQUE ) 300 MG/ML solution 50 mL  50 mL Per Tube Once PRN Hughes Simmonds, MD       latanoprost  (XALATAN ) 0.005 % ophthalmic solution 1 drop  1 drop Both Eyes Daily Abran Norleen SAILOR, MD   1 drop at 03/29/24 9155   menthol  (CEPACOL) lozenge 3 mg  1 lozenge Oral PRN Christobal Guadalajara, MD       morphine  (PF) 2 MG/ML injection 1 mg  1 mg Intravenous Q4H PRN Abran Norleen SAILOR, MD       ondansetron  (ZOFRAN ) injection 4 mg  4 mg Intravenous Q6H PRN Abran Norleen SAILOR, MD       Oral care mouth rinse  15 mL Mouth Rinse PRN Kc, Ramesh, MD       senna (SENOKOT) tablet 8.6 mg  1 tablet Oral BID Abran Norleen SAILOR, MD   8.6 mg at 03/29/24 2119   sodium chloride  flush (NS) 0.9 % injection 5 mL  5 mL Intracatheter Q8H Allred, Darrell K, PA-C   5 mL at 03/30/24 9371   tamsulosin  (FLOMAX ) capsule 0.4 mg  0.4 mg Oral QPC supper Alto Isaiah CROME, NP   0.4 mg at 03/29/24 1711     LABORATORY DATA:  I have reviewed the data as listed Lab Results  Component Value Date   WBC 7.3 03/30/2024   HGB 8.0 (L) 03/30/2024   HCT 23.5 (L) 03/30/2024   MCV 91.4 03/30/2024   PLT 331 03/30/2024   Recent Labs    03/19/24 0414 03/19/24 0415 03/20/24 0355 03/21/24 0405 03/22/24 1303 03/28/24 0358 03/29/24 0413 03/30/24 0419  NA  --    < > 140  --    < > 138 139 139  K  --    < > 3.4*  --    < > 3.7 3.3* 4.0  CL  --    < > 103  --    < >  105 103 105  CO2  --    < > 29  --    < > 25 24 24   GLUCOSE  --    < > 98  --    < > 118* 130* 121*  BUN  --    < > 21  --    < > 19 20 21   CREATININE  --    < > 2.65*  --    < > 1.62* 1.64* 1.61*  CALCIUM   --    < > 8.7*  --    < > 9.2 9.6 9.6  GFRNONAA  --    < > 23*  --    < > 41* 41* 42*  PROT 5.3*  --  5.3* 5.5*   < > 6.3* 6.5 6.7  ALBUMIN  2.6*   < > 2.5*  2.5* 2.6*   < > 2.7* 2.8* 2.9*  AST 103*  --  64* 49*   < > 39 39 43*  ALT 155*  --  117* 99*   < > 40 42 47*  ALKPHOS 1,175*  --  1,049* 879*   < > 607* 636* 809*  BILITOT 3.1*  --  2.7* 2.4*   < > 2.7*  2.4* 2.3*  BILIDIR 2.5*  --  2.1* 1.9*  --   --   --   --   IBILI 0.7  --  0.6 0.5  --   --   --   --    < > = values in this interval not displayed.    RADIOGRAPHIC STUDIES: I have personally reviewed the radiological images as listed and agreed with the findings in the report. IR INT EXT BILIARY DRAIN WITH CHOLANGIOGRAM Result Date: 03/18/2024 INDICATION: Biliary dilatation.  Aborted ERCP. Briefly, 84 year old male with distal CBD obstruction, suspected malignant. ERCP aborted secondary to inability to cannulate biliary tree. EXAM: Procedures; 1. PERCUTANEOUS TRANSHEPATIC CHOLANGIOGRAM 2. ULTRASOUND AND FLUOROSCOPIC GUIDED BILIARY DRAINAGE TUBE PLACEMENT COMPARISON:  MRCP, 03/15/2024. ERCP fluoroscopy, earlier same day. CT AP, 03/14/2024. MEDICATIONS: Rocephin  2 gm IV; The antibiotic was administered with an appropriate time frame prior to the initiation of the procedure 25 mg Demerol  IV CONTRAST:  20 mL Omnipaque  300-administered into the biliary tree. ANESTHESIA/SEDATION: Moderate (conscious) sedation was employed during this procedure. A total of Versed  2 mg and Fentanyl  100 mcg was administered intravenously. Moderate Sedation Time: 40 minutes. The patient's level of consciousness and vital signs were monitored continuously by radiology nursing throughout the procedure under my direct supervision. FLUOROSCOPY: Radiation Exposure Index and estimated peak skin dose (PSD); Reference air kerma (RAK), 41 mGy. COMPLICATIONS: None immediate. TECHNIQUE: Informed written consent was obtained from the patient and/or patient's representative after a discussion of the risks, benefits and alternatives to treatment. Questions regarding the procedure were encouraged and answered. A timeout was performed prior to the initiation of the procedure. The RIGHT upper abdominal quadrant was prepped and draped in the usual sterile fashion, and a sterile drape was applied covering the operative field. Maximum barrier sterile  technique with sterile gowns and gloves were used for the procedure. A timeout was performed prior to the initiation of the procedure. Ultrasound scanning of the right upper abdominal quadrant was performed to delineate the anatomy and avoid transgression of the gallbladder or the pleura. After the overlying soft tissues were anesthetized with 1% Lidocaine  with epinephrine , under direct ultrasound guidance, a 22 gauge Chiba needle was utilized to cannulate a peripheral aspect of an intrahepatic biliary  duct. A RIGHT-sided approach distal to the biliary hilum was targeted. Appropriate position was confirmed with limited contrast injection. Next, the duct was cannulated with a Nitrex wire and dilated with an Accustick set under fluoroscopic guidance. Limited cholangiograms were performed in various obliquities confirming appropriate access. Next, a 4 Fr angled glide catheter was advanced through the outer sheath of the Accustick set and with the use of a stiff Glidewire, advanced through the biliary hilum, common bile duct and ampulla to the level of the duodenum. Contrast injection confirmed appropriate positioning. Under intermittent fluoroscopic guidance and over an Amplatz wire, the track was dilated ultimately allowing placement of a 10 Fr biliary drainage catheter with coil ultimately locked within the duodenum. Contrast was injected and a completion radiographs were obtained in various obliquities. The catheter was connected to a drainage bag which yielded the brisk return of clear bile. The catheter was secured to the skin with an interrupted suture and StatLock device. Dressings were applied. The patient tolerated the procedure well without immediate postprocedural complication. FINDINGS: *Sonographic evaluation of the liver demonstrates intrahepatic biliary ductal dilatation as well as gallbladder distention. *Under direct ultrasound guidance, a dilated duct distal to the hilum from a RIGHT-sided approach  was accessed *Placement of a 10 Fr biliary drainage catheter with end coiled and locked within the duodenum and radiopaque marker located proximal to the level of the biliary hilum. *Limited contrast injection demonstrates dilatation of the biliary tree, with obstruction at the distal the CBD. *20 mL of bile was submitted for microbiological analysis. IMPRESSION: 1. Successful placement of a 10 Fr percutaneous biliary drainage catheter via a RIGHT transhepatic approach, with end coiled and locked within the duodenum. 2. Distal common bile duct obstruction, with shouldering suspicious for malignancy. Bile submitted for microbiological and cytopathological analysis. RECOMMENDATIONS: The patient will return to Vascular Interventional Radiology (VIR) for routine drainage catheter evaluation and exchange in 8 weeks. Thom Hall, MD [facility] Radiology Electronically Signed   By: Thom Hall M.D.   On: 03/18/2024 07:40   DG ERCP Result Date: 03/17/2024 CLINICAL DATA:  [id number] Surgery, elective [id number] EXAM: ERCP COMPARISON:  CT AP, 03/14/2024.  MRCP, 03/15/2024. FLUOROSCOPY: Exposure Index (as provided by the fluoroscopic device): 50.1 mGy Kerma FINDINGS: Limited oblique planar images of the RIGHT upper quadrant obtained C-arm. Images demonstrating flexible endoscopy. No submitted images for retrograde cholangiogram., IMPRESSION: Fluoroscopic imaging for ERCP. No submitted images for retrograde cholangiogram For complete description of intra procedural findings, please see performing service dictation. Electronically Signed   By: Thom Hall M.D.   On: 03/17/2024 13:53   MR ABDOMEN MRCP W WO CONTRAST Result Date: 03/15/2024 EXAM: MRCP WITH AND WITHOUT IV CONTRAST 03/15/2024 04:41:16 PM TECHNIQUE: Multisequence, multiplanar magnetic resonance images of the abdomen with and without intravenous contrast. MRCP sequences were performed. Acquisition workstation 3D images  were acquired by the technologist using existing protocol; radiologist available for consultation but did not designate specific images to acquire in this individual case. COMPARISON: CT abdomen 03/14/2024. CLINICAL HISTORY: Biliary ductal dilation and transaminitis. Suspected pancreatic head mass on recent CT scan. FINDINGS: LIVER: 8 mm focus of restricted diffusion just above the gallbladder fossa on image 24 series 8, not well corroborated on other imaging sequences, possibly artifact although a small subtle metastatic lesion cannot be totally excluded . GALLBLADDER AND BILIARY SYSTEM: Severe intrahepatic biliary dilatation and common bile duct dilatation up to 1.5 cm extending to a mass in the head of the pancreas. Mildly  distended gallbladder with mild gallbladder wall thickening; strictly speaking, cholecystitis is not excluded. SPLEEN: Unremarkable. PANCREAS/PANCREATIC DUCT: Indistinctly marginated 3.3 x 2.3 x 2.1 cm mass in the head of the pancreas demonstrating relative isoenhancement on early arterial phase images and mildly accentuated enhancement on delayed images and also demonstrating substantial restriction of diffusion as on image 28 series 8. The dorsopancreatic duct is likewise prominently dilated extending into this mass. I don't see definite involvement of the celiac trunk or superior mesenteric artery. No definite invasion or encirclement of the portal vein or superior mesenteric vein (SMV). ADRENAL GLANDS: Unremarkable. KIDNEYS: Unremarkable. LYMPH NODES: No enlarged abdominal lymph nodes. VASCULATURE: No definite involvement of the celiac trunk or superior mesenteric artery. No definite invasion or encirclement of the portal vein or superior mesenteric vein (SMV). PERITONEUM: No ascites. ABDOMINAL WALL: Left lumbar hernia on image 57 series 19 contains adipose tissue. No mass. BOWEL: Grossly unremarkable. No bowel obstruction. BONES: No acute abnormality or worrisome osseous lesion. SOFT  TISSUES: Prior median sternotomy. Motion artifact is present, reducing diagnostic sensitivity and specificity. MISCELLANEOUS: Unremarkable. IMPRESSION: 1. 3.3 x 2.3 x 2.1 cm pancreatic head mass associated with severe dorsal pancreatic duct and biliary dilatation , high suspicion for pancreatic adenocarcinoma. Tissue diagnosis recommended. Although we are limited by spatial resolution and motion artifact, no definite involvement observed of the celiac trunk, superior mesenteric artery, portal vein, or superior mesenteric vein. The mass is very indistinctly marginated and poorly visible on most sequences but is more conspicuous on high b-value diffusion-weighted images. 2. 8 mm in diameter focus of restricted diffusion in the liver just above the gallbladder fossa on image 24, series 8, nonspecific, although small metastatic lesion is difficult to exclude . 3. Mild gallbladder distention and mild gallbladder wall thickening; cholecystitis is not excluded. 4. Chronic and incidental findings include prior median sternotomy and a left lumbar hernia containing adipose tissue. Electronically signed by: Ryan Salvage MD MD 03/15/2024 05:11 PM EST RP Workstation: [id number]   MR 3D Recon At Scanner Result Date: 03/15/2024 EXAM: MRCP WITH AND WITHOUT IV CONTRAST 03/15/2024 04:41:16 PM TECHNIQUE: Multisequence, multiplanar magnetic resonance images of the abdomen with and without intravenous contrast. MRCP sequences were performed. Acquisition workstation 3D images were acquired by the technologist using existing protocol; radiologist available for consultation but did not designate specific images to acquire in this individual case. COMPARISON: CT abdomen 03/14/2024. CLINICAL HISTORY: Biliary ductal dilation and transaminitis. Suspected pancreatic head mass on recent CT scan. FINDINGS: LIVER: 8 mm focus of restricted diffusion just above the gallbladder fossa on image 24 series 8, not well corroborated on other imaging  sequences, possibly artifact although a small subtle metastatic lesion cannot be totally excluded . GALLBLADDER AND BILIARY SYSTEM: Severe intrahepatic biliary dilatation and common bile duct dilatation up to 1.5 cm extending to a mass in the head of the pancreas. Mildly distended gallbladder with mild gallbladder wall thickening; strictly speaking, cholecystitis is not excluded. SPLEEN: Unremarkable. PANCREAS/PANCREATIC DUCT: Indistinctly marginated 3.3 x 2.3 x 2.1 cm mass in the head of the pancreas demonstrating relative isoenhancement on early arterial phase images and mildly accentuated enhancement on delayed images and also demonstrating substantial restriction of diffusion as on image 28 series 8. The dorsopancreatic duct is likewise prominently dilated extending into this mass. I don't see definite involvement of the celiac trunk or superior mesenteric artery. No definite invasion or encirclement of the portal vein or superior mesenteric vein (SMV). ADRENAL GLANDS: Unremarkable. KIDNEYS: Unremarkable. LYMPH NODES: No enlarged abdominal lymph nodes.  VASCULATURE: No definite involvement of the celiac trunk or superior mesenteric artery. No definite invasion or encirclement of the portal vein or superior mesenteric vein (SMV). PERITONEUM: No ascites. ABDOMINAL WALL: Left lumbar hernia on image 57 series 19 contains adipose tissue. No mass. BOWEL: Grossly unremarkable. No bowel obstruction. BONES: No acute abnormality or worrisome osseous lesion. SOFT TISSUES: Prior median sternotomy. Motion artifact is present, reducing diagnostic sensitivity and specificity. MISCELLANEOUS: Unremarkable. IMPRESSION: 1. 3.3 x 2.3 x 2.1 cm pancreatic head mass associated with severe dorsal pancreatic duct and biliary dilatation , high suspicion for pancreatic adenocarcinoma. Tissue diagnosis recommended. Although we are limited by spatial resolution and motion artifact, no definite involvement observed of the celiac trunk,  superior mesenteric artery, portal vein, or superior mesenteric vein. The mass is very indistinctly marginated and poorly visible on most sequences but is more conspicuous on high b-value diffusion-weighted images. 2. 8 mm in diameter focus of restricted diffusion in the liver just above the gallbladder fossa on image 24, series 8, nonspecific, although small metastatic lesion is difficult to exclude . 3. Mild gallbladder distention and mild gallbladder wall thickening; cholecystitis is not excluded. 4. Chronic and incidental findings include prior median sternotomy and a left lumbar hernia containing adipose tissue. Electronically signed by: Ryan Salvage MD MD 03/15/2024 05:11 PM EST RP Workstation: [id number]   CT ABDOMEN PELVIS WO CONTRAST Result Date: 03/14/2024 EXAM: CT ABDOMEN AND PELVIS WITHOUT CONTRAST 03/14/2024 06:52:08 PM TECHNIQUE: CT of the abdomen and pelvis was performed without the administration of intravenous contrast. Multiplanar reformatted images are provided for review. Automated exposure control, iterative reconstruction, and/or weight-based adjustment of the mA/kV was utilized to reduce the radiation dose to as low as reasonably achievable. COMPARISON: None available. CLINICAL HISTORY: Abdominal pain, acute, nonlocalized; Hypotensive, elevated liver/bilirubin. Dizziness. Weight loss. FINDINGS: LOWER CHEST: Sternotomy wires. Mild dependent changes in the lung bases. LIVER: Diffuse intra- and extrahepatic bile duct dilatation. GALLBLADDER AND BILE DUCTS: The gallbladder is mildly distended. No gallstones are identified. Diffuse intra- and extrahepatic bile duct dilatation. SPLEEN: The spleen is normal. No nodules. PANCREAS: Pancreatic ductal dilatation with fullness in the head of the pancreas. Unenhanced evaluation is limited, but this likely represents a pancreatic mass measuring about 2.5 cm in diameter. Correlate with MRCP or contrast-enhanced CT abdomen and pelvis for further  evaluation. ADRENAL GLANDS: No acute abnormality. KIDNEYS, URETERS AND BLADDER: Kidneys are normal. No stones in the kidneys or ureters. No hydronephrosis. No perinephric or periureteral stranding. The bladder wall is mildly thickened, likely due to outlet obstruction. GI AND BOWEL: Stomach, small bowel, and colon are not abnormally distended. No discrete wall thickening is appreciated. There is no bowel obstruction. Small left inguinal hernia containing small bowel without proximal obstruction. PERITONEUM AND RETROPERITONEUM: No ascites. No free air. VASCULATURE: Aorta is normal in caliber. Calcification of the aorta. No aneurysm. LYMPH NODES: No lymphadenopathy. REPRODUCTIVE ORGANS: The prostate gland is enlarged. Measuring 6.5 cm in diameter. BONES AND SOFT TISSUES: Degenerative changes in the spine. No focal bone lesions. Left lumbar triangle abdominal wall hernia containing fat. IMPRESSION: 1. Fullness in the head of the pancreas with associated pancreatic and bile duct dilatation, likely representing a pancreatic adenocarcinoma measuring about 2.5 cm in diameter; unenhanced evaluation is limited, and MRCP or contrast-enhanced CT abdomen and pelvis is recommended for further evaluation. Electronically signed by: Elsie Gravely MD 03/14/2024 07:22 PM EST RP Workstation: [id number]     I personally spent a total of 40 minutes minutes in the care of the  patient today including preparing to see the patient, getting/reviewing separately obtained history, performing a medically appropriate exam/evaluation, referring and communicating with other health care professionals, documenting clinical information in the EHR, communicating results, and coordinating care.    All questions were answered. The patient knows to call the clinic with any problems, questions or concerns. No barriers to learning was detected.  John T Dorsey IV, MD 1/22/20268:27 AM  I have read the above note and personally examined the  patient. I agree with the assessment and plan as noted above.  Briefly Luis Chen is an 84 year old male known to our clinic for MGUS who is currently admitted with biliary obstruction due to a pancreatic head mass.  Review of the imaging shows a 3.3 x 2.3 x 2.1 cm mass in the head of the pancreas with dilated pancreatic duct.  There was no definite involvement of the celiac trunk or superior mesenteric artery.  There was concern for an 8 mm focus of restricted diffusion in the liver just above the gallbladder fossa, concerning for possible metastatic disease.  Patient currently has a percutaneous bili drain in place and bilirubin has continued to trend downward since his placement, noted to be 2.4 today.  ERCP was attempted on 03/17/2024 but unable to thread through the guidewire.  Creatinine is at 1.61 with a white blood cell count of 7.4, hemoglobin 7.9, and platelets of 311.  Today we discussed options moving forward given the pancreatic head mass.  We discussed treatment options which would include neoadjuvant chemotherapy/surgery, radiation therapy, chemotherapy alone, and comfort based care.  The patient notes he would want to pursue any and all treatments available to him for management of his disease.  At this time recommend outpatient PET CT scan to evaluate for possible metastatic disease (in particular the lesion on the liver).  Additionally would recommend outpatient surgical evaluation to determine if he would be considered a candidate for surgical resection.  I am concerned that in his current deconditioned state, advanced age, and percutaneous bili drain in place that he would not be considered a chemotherapy candidate.  At this time would recommend focusing on improving his strength.  We will plan for an outpatient PET CT scan and further discussions of options moving forward.   Norleen IVAR Kidney, MD Department of Hematology/Oncology [facility] Phone: [phone] Pager: [phone] Email: [email or web address]

## 2024-03-28 NOTE — Progress Notes (Signed)
 Mobility Specialist - Progress Note:   03/28/24 1519  Mobility  Activity Ambulated with assistance  Level of Assistance Contact guard assist, steadying assist  Assistive Device Front wheel walker  Distance Ambulated (ft) 350 ft  Activity Response Tolerated well  Mobility Referral Yes  Mobility visit 1 Mobility  Mobility Specialist Start Time (ACUTE ONLY) 1149  Mobility Specialist Stop Time (ACUTE ONLY) 1208  Mobility Specialist Time Calculation (min) (ACUTE ONLY) 19 min   Pt was received in recliner and agreed to mobility. No complaints/issues during ambulation, Pt stated he was feeling better. Returned to recliner with all needs met. Call bell in reach.  [employer] - Mobility Specialist - Acute Rehabilitation Can be reached via [employer]

## 2024-03-28 NOTE — Progress Notes (Signed)
 "   Referring Physician(s): Abran Rush  Supervising Physician: Luverne Aran  Patient Status:  [facility] - In-pt  Chief Complaint: biliary obstruction, pancreatic mass; s/p I/E biliary drain placement on 03/17/2024  Subjective: Patient lying in bed. Denies nausea, vomiting. No concerns regarding drain. Reports mild stiffness in his neck which is improved with use of a muscle cream.  Allergies: Ace inhibitors and Entresto  [sacubitril-valsartan]  Medications: Prior to Admission medications  Medication Sig Start Date End Date Taking? Authorizing Provider  aspirin  81 MG EC tablet Take 1 tablet (81 mg total) by mouth daily. 07/28/19  Yes Meng, Hao, PA  carvedilol  (COREG ) 6.25 MG tablet Take 0.5 tablets (3.125 mg total) by mouth 2 (two) times daily with a meal. 05/28/20  Yes [name], MD  Cholecalciferol (VITAMIN D3) 1000 units CAPS Take 1,000 Units by mouth daily.   Yes [provider]  diclofenac  Sodium (VOLTAREN ) 1 % GEL 2 g 4 (four) times daily as needed (for pain- left knee and any other affected area). 03/08/23  Yes [provider]  ezetimibe  (ZETIA ) 10 MG tablet Take 10 mg by mouth daily.   Yes [provider]  Ferrous Gluconate  324 (37.5 Fe) MG TABS TAKE 1 TABLET BY MOUTH TWICE DAILY WITH MEALS Patient taking differently: Take 324 mg by mouth See admin instructions. Take 324 mg by mouth with food on Mon/Wed/Fri 02/08/18  Yes Shadad, Firas N, MD  KETOTIFEN FUMARATE OP Place 1 drop into both eyes See admin instructions. Ketotifen 0.025% eye drops - Instill 1 drop into both eyes one to two times a day   Yes [provider]  latanoprost  (XALATAN ) 0.005 % ophthalmic solution Place 1 drop into both eyes at bedtime. 01/22/17  Yes [provider]  losartan  (COZAAR ) 50 MG tablet Take 50 mg by mouth 2 (two) times daily.   Yes [provider]  Nutritional Supplements (SUPLENA/CARB STEADY) LIQD Take 237 mLs by mouth See admin instructions. Drink  237 ml's of Vanilla Suplena by mouth three times a day   Yes [provider]  rosuvastatin  (CRESTOR ) 40 MG tablet TAKE 1 TABLET BY MOUTH ONCE DAILY AT  6PM Patient taking differently: Take 40 mg by mouth at bedtime. 11/07/18  Yes [name], MD  traZODone  (DESYREL ) 150 MG tablet Take 150 mg by mouth at bedtime as needed for sleep.    Yes [provider]  trolamine salicylate (ASPERCREME) 10 % cream Apply 1 Application topically as needed (for left knee pain and/or other affected areas).   Yes [provider]  ezetimibe  (ZETIA ) 10 MG tablet Take 1 tablet (10 mg total) by mouth daily. Patient not taking: Reported on 03/15/2024 12/05/18 03/15/24  Rolan Ezra RAMAN, MD  oxyCODONE -acetaminophen  (PERCOCET/ROXICET) 5-325 MG tablet Take 1 tablet by mouth every 6 (six) hours as needed for severe pain (pain score 7-10). Patient not taking: Reported on 03/15/2024 03/09/23   Henderly, Britni A, PA-C  spironolactone  (ALDACTONE ) 25 MG tablet Take 1 tablet (25 mg total) by mouth daily. Patient not taking: Reported on 03/15/2024 12/06/18 03/15/24  Rolan Ezra RAMAN, MD     Vital Signs: BP (!) 124/58 (BP Location: Left Arm)   Pulse 83   Temp 99 F (37.2 C) (Oral)   Resp 17   Ht 5' 10 (1.778 m)   Wt 130 lb 4.7 oz (59.1 kg)   SpO2 97%   BMI 18.69 kg/m    Physical Exam Cardiovascular:     Rate and Rhythm: Normal rate.  Pulmonary:     Effort: Pulmonary effort is normal. No respiratory distress.  Abdominal:     Palpations: Abdomen is soft.     Tenderness: There is abdominal tenderness.     Comments: Mild tenderness in RUQ Bili drain- Flushes easily Insertion site unremarkable Suture in place Dressed appropriately  Skin:    General: Skin is warm.  Neurological:     Mental Status: He is alert and oriented to person, place, and time.  Psychiatric:        Mood and Affect: Mood normal.        Behavior: Behavior normal.     Imaging: No results found.  Labs:  CBC: Recent  Labs    03/25/24 0334 03/26/24 0406 03/27/24 0342 03/28/24 0358  WBC 11.5* 10.4 10.2 8.2  HGB 9.1* 9.2* 8.5* 7.7*  HCT 26.7* 26.6* 24.7* 21.9*  PLT 235 251 250 276    COAGS: Recent Labs    03/14/24 1730 03/16/24 0447  INR 1.0 1.1    BMP: Recent Labs    03/25/24 0334 03/26/24 0406 03/27/24 0342 03/28/24 0358  NA 138 135 138 138  K 3.8 3.5 3.6 3.7  CL 105 101 103 105  CO2 25 24 25 25   GLUCOSE 96 135* 107* 118*  BUN 15 17 21 19   CALCIUM  9.4 9.5 9.3 9.2  CREATININE 1.80* 1.68* 1.76* 1.62*  GFRNONAA 36* 40* 37* 41*    LIVER FUNCTION TESTS: Recent Labs    03/25/24 0334 03/26/24 0406 03/27/24 0342 03/28/24 0358  BILITOT 3.9* 3.6* 3.5* 2.7*  AST 48* 41 36 39  ALT 55* 44 41 40  ALKPHOS 686* 613* 557* 607*  PROT 6.4* 6.4* 6.3* 6.3*  ALBUMIN  3.0* 2.9* 2.7* 2.7*    Assessment and Plan:  Drain Location: RUQ Size: Fr size: 10 Fr Date of placement: 03/17/2024  Currently to: Drain collection device: gravity 24 hour output:  Output by Drain (mL) 03/26/24 0701 - 03/26/24 1900 03/26/24 1901 - 03/27/24 0700 03/27/24 0701 - 03/27/24 1900 03/27/24 1901 - 03/28/24 0700 03/28/24 0701 - 03/28/24 0945  Biliary Tube RLQ 350 900 350 200     Current examination: Flushes easily.  Insertion site unremarkable. Suture in place. Dressed appropriately.   Plan: Continue TID flushes with 5 cc NS. Record output Q shift. Dressing changes QD or PRN if soiled.  Call IR APP or on call IR MD if difficulty flushing or sudden change in drain output.   Discharge planning: Please contact IR APP or on call IR MD prior to patient d/c to ensure appropriate follow up plans are in place. Typically patient will follow up with IR clinic 6-8 weeks for drain exchanges. Patient will need to flush drain once daily and change dressing every 3-5 days or PRN if soiled.   IR will continue to follow - please call with questions or concerns.     Electronically Signed: Jeshawn Melucci B Jusiah Aguayo,  NP 03/28/2024, 9:45 AM   I spent a total of 15 Minutes at the the patient's bedside AND on the patient's hospital floor or unit, greater than 50% of which was counseling/coordinating care for drain follow up.  "

## 2024-03-29 DIAGNOSIS — K831 Obstruction of bile duct: Secondary | ICD-10-CM | POA: Diagnosis not present

## 2024-03-29 LAB — CBC WITH DIFFERENTIAL/PLATELET
Abs Immature Granulocytes: 0.02 K/uL (ref 0.00–0.07)
Basophils Absolute: 0 K/uL (ref 0.0–0.1)
Basophils Relative: 0 %
Eosinophils Absolute: 0.6 K/uL — ABNORMAL HIGH (ref 0.0–0.5)
Eosinophils Relative: 8 %
HCT: 22.3 % — ABNORMAL LOW (ref 39.0–52.0)
Hemoglobin: 7.9 g/dL — ABNORMAL LOW (ref 13.0–17.0)
Immature Granulocytes: 0 %
Lymphocytes Relative: 21 %
Lymphs Abs: 1.6 K/uL (ref 0.7–4.0)
MCH: 31.7 pg (ref 26.0–34.0)
MCHC: 35.4 g/dL (ref 30.0–36.0)
MCV: 89.6 fL (ref 80.0–100.0)
Monocytes Absolute: 0.6 K/uL (ref 0.1–1.0)
Monocytes Relative: 8 %
Neutro Abs: 4.7 K/uL (ref 1.7–7.7)
Neutrophils Relative %: 63 %
Platelets: 311 K/uL (ref 150–400)
RBC: 2.49 MIL/uL — ABNORMAL LOW (ref 4.22–5.81)
RDW: 17 % — ABNORMAL HIGH (ref 11.5–15.5)
WBC: 7.4 K/uL (ref 4.0–10.5)
nRBC: 0 % (ref 0.0–0.2)

## 2024-03-29 LAB — COMPREHENSIVE METABOLIC PANEL WITH GFR
ALT: 42 U/L (ref 0–44)
AST: 39 U/L (ref 15–41)
Albumin: 2.8 g/dL — ABNORMAL LOW (ref 3.5–5.0)
Alkaline Phosphatase: 636 U/L — ABNORMAL HIGH (ref 38–126)
Anion gap: 11 (ref 5–15)
BUN: 20 mg/dL (ref 8–23)
CO2: 24 mmol/L (ref 22–32)
Calcium: 9.6 mg/dL (ref 8.9–10.3)
Chloride: 103 mmol/L (ref 98–111)
Creatinine, Ser: 1.64 mg/dL — ABNORMAL HIGH (ref 0.61–1.24)
GFR, Estimated: 41 mL/min — ABNORMAL LOW
Glucose, Bld: 130 mg/dL — ABNORMAL HIGH (ref 70–99)
Potassium: 3.3 mmol/L — ABNORMAL LOW (ref 3.5–5.1)
Sodium: 139 mmol/L (ref 135–145)
Total Bilirubin: 2.4 mg/dL — ABNORMAL HIGH (ref 0.0–1.2)
Total Protein: 6.5 g/dL (ref 6.5–8.1)

## 2024-03-29 LAB — PHOSPHORUS: Phosphorus: 4.2 mg/dL (ref 2.5–4.6)

## 2024-03-29 LAB — MAGNESIUM: Magnesium: 2.4 mg/dL (ref 1.7–2.4)

## 2024-03-29 MED ORDER — POTASSIUM CHLORIDE CRYS ER 20 MEQ PO TBCR
40.0000 meq | EXTENDED_RELEASE_TABLET | Freq: Once | ORAL | Status: AC
  Administered 2024-03-29: 40 meq via ORAL
  Filled 2024-03-29: qty 2

## 2024-03-29 NOTE — Progress Notes (Cosign Needed)
 "   Referring Physician(s): Abran Rush  Supervising Physician: Hughes Simmonds  Patient Status:  [facility] - In-pt  Chief Complaint: biliary obstruction, pancreatic mass; s/p I/E biliary drain placement on 03/17/2024  Subjective: Patient sitting up in chair.  Able to access drain for visit.  No complaints related to drain.   Allergies: Ace inhibitors and Entresto  [sacubitril-valsartan]  Medications: Prior to Admission medications  Medication Sig Start Date End Date Taking? Authorizing Provider  aspirin  81 MG EC tablet Take 1 tablet (81 mg total) by mouth daily. 07/28/19  Yes Meng, Hao, PA  carvedilol  (COREG ) 6.25 MG tablet Take 0.5 tablets (3.125 mg total) by mouth 2 (two) times daily with a meal. 05/28/20  Yes [name], MD  Cholecalciferol (VITAMIN D3) 1000 units CAPS Take 1,000 Units by mouth daily.   Yes [provider]  diclofenac  Sodium (VOLTAREN ) 1 % GEL 2 g 4 (four) times daily as needed (for pain- left knee and any other affected area). 03/08/23  Yes [provider]  ezetimibe  (ZETIA ) 10 MG tablet Take 10 mg by mouth daily.   Yes [provider]  Ferrous Gluconate  324 (37.5 Fe) MG TABS TAKE 1 TABLET BY MOUTH TWICE DAILY WITH MEALS Patient taking differently: Take 324 mg by mouth See admin instructions. Take 324 mg by mouth with food on Mon/Wed/Fri 02/08/18  Yes Shadad, Firas N, MD  KETOTIFEN FUMARATE OP Place 1 drop into both eyes See admin instructions. Ketotifen 0.025% eye drops - Instill 1 drop into both eyes one to two times a day   Yes [provider]  latanoprost  (XALATAN ) 0.005 % ophthalmic solution Place 1 drop into both eyes at bedtime. 01/22/17  Yes [provider]  losartan  (COZAAR ) 50 MG tablet Take 50 mg by mouth 2 (two) times daily.   Yes [provider]  Nutritional Supplements (SUPLENA/CARB STEADY) LIQD Take 237 mLs by mouth See admin instructions. Drink 237 ml's of Vanilla Suplena by mouth three times a day   Yes  [provider]  rosuvastatin  (CRESTOR ) 40 MG tablet TAKE 1 TABLET BY MOUTH ONCE DAILY AT  6PM Patient taking differently: Take 40 mg by mouth at bedtime. 11/07/18  Yes [name], MD  traZODone  (DESYREL ) 150 MG tablet Take 150 mg by mouth at bedtime as needed for sleep.    Yes [provider]  trolamine salicylate (ASPERCREME) 10 % cream Apply 1 Application topically as needed (for left knee pain and/or other affected areas).   Yes [provider]  ezetimibe  (ZETIA ) 10 MG tablet Take 1 tablet (10 mg total) by mouth daily. Patient not taking: Reported on 03/15/2024 12/05/18 03/15/24  Rolan Ezra RAMAN, MD  oxyCODONE -acetaminophen  (PERCOCET/ROXICET) 5-325 MG tablet Take 1 tablet by mouth every 6 (six) hours as needed for severe pain (pain score 7-10). Patient not taking: Reported on 03/15/2024 03/09/23   Henderly, Britni A, PA-C  spironolactone  (ALDACTONE ) 25 MG tablet Take 1 tablet (25 mg total) by mouth daily. Patient not taking: Reported on 03/15/2024 12/06/18 03/15/24  Rolan Ezra RAMAN, MD     Vital Signs: BP 117/68 (BP Location: Right Arm)   Pulse 90   Temp 98.5 F (36.9 C) (Oral)   Resp 16   Ht 5' 10 (1.778 m)   Wt 136 lb 0.4 oz (61.7 kg)   SpO2 100%   BMI 19.52 kg/m    Physical Exam Cardiovascular:     Rate and Rhythm: Normal rate.  Pulmonary:     Effort: Pulmonary  effort is normal. No respiratory distress.  Abdominal:     Palpations: Abdomen is soft.     Tenderness: There is abdominal tenderness.     Comments: Biliary drain in place.  Insertion site intact, clean, and dry.  Gravity bag full with bilious output and debris. Flushes easily.   Skin:    General: Skin is warm.  Neurological:     Mental Status: He is alert and oriented to person, place, and time.  Psychiatric:        Mood and Affect: Mood normal.        Behavior: Behavior normal.     Imaging: No results found.  Labs:  CBC: Recent Labs    03/26/24 0406 03/27/24 0342  03/28/24 0358 03/29/24 0413  WBC 10.4 10.2 8.2 7.4  HGB 9.2* 8.5* 7.7* 7.9*  HCT 26.6* 24.7* 21.9* 22.3*  PLT 251 250 276 311    COAGS: Recent Labs    03/14/24 1730 03/16/24 0447  INR 1.0 1.1    BMP: Recent Labs    03/26/24 0406 03/27/24 0342 03/28/24 0358 03/29/24 0413  NA 135 138 138 139  K 3.5 3.6 3.7 3.3*  CL 101 103 105 103  CO2 24 25 25 24   GLUCOSE 135* 107* 118* 130*  BUN 17 21 19 20   CALCIUM  9.5 9.3 9.2 9.6  CREATININE 1.68* 1.76* 1.62* 1.64*  GFRNONAA 40* 37* 41* 41*    LIVER FUNCTION TESTS: Recent Labs    03/26/24 0406 03/27/24 0342 03/28/24 0358 03/29/24 0413  BILITOT 3.6* 3.5* 2.7* 2.4*  AST 41 36 39 39  ALT 44 41 40 42  ALKPHOS 613* 557* 607* 636*  PROT 6.4* 6.3* 6.3* 6.5  ALBUMIN  2.9* 2.7* 2.7* 2.8*    Assessment and Plan:  Drain Location: RUQ Size: Fr size: 10 Fr Date of placement: 03/17/2024  Currently to: Drain collection device: gravity 24 hour output:  Output by Drain (mL) 03/27/24 0701 - 03/27/24 1900 03/27/24 1901 - 03/28/24 0700 03/28/24 0701 - 03/28/24 1900 03/28/24 1901 - 03/29/24 0700 03/29/24 0701 - 03/29/24 1628  Biliary Tube RLQ 350 200 450 950     Current examination: Flushes easily.  Insertion site unremarkable. Suture in place. Dressed appropriately.  Monitor electrolytes-- mild hypokalemia today.   Plan: Continue TID flushes with 5 cc NS. Record output Q shift. Dressing changes QD or PRN if soiled.  Call IR APP or on call IR MD if difficulty flushing or sudden change in drain output.   Discharge planning: Please contact IR APP or on call IR MD prior to patient d/c to ensure appropriate follow up plans are in place. Typically patient will follow up with IR clinic 6-8 weeks for drain exchanges. Patient will need to flush drain once daily and change dressing every 3-5 days or PRN if soiled.   IR will continue to follow - please call with questions or concerns.   Electronically Signed: Ollie Esty Sue-Ellen Syan Cullimore,  PA 03/29/2024, 4:28 PM   I spent a total of 15 Minutes at the the patient's bedside AND on the patient's hospital floor or unit, greater than 50% of which was counseling/coordinating care for drain follow up.  "

## 2024-03-29 NOTE — Progress Notes (Signed)
 " PROGRESS NOTE    Luis Chen  FMW:[id number] DOB: 18-Jun-1940 DOA: 03/14/2024 PCP: [name], MD     Brief Narrative:  Luis Chen is a 84 y.o. male with PMH of CKD stage III, CAD, MGUS, HLD who presented with lightheadedness and low blood pressure, and weight loss over the last few weeks. Blood work in the ED showed elevated LFTs, significantly elevated alkaline phosphatase and bilirubin. CT abdomen/pelvis showed concern for pancreatic mass , labs showed AKI.  Gastroenterology and nephrology IR has been consulted.  S/p ERCP but unable to pass guidewire, now s/p perc biliary drain by IR.  Patient subsequently underwent a EUS with biopsies positive for pancreatic adenocarcinoma.  Oncology consulted.  New events last 24 hours / Subjective: Feeling well, awaiting to meet with oncology regarding treatment plan options.  Afebrile.  Assessment & Plan:   Principal Problem:   Obstructive jaundice (HCC) Active Problems:   Anemia, chronic disease   Chronic kidney disease (CKD), stage III (moderate) (HCC)   MGUS (monoclonal gammopathy of unknown significance)   Coronary artery disease involving native coronary artery of native heart with other form of angina pectoris   Transaminitis   Pancreatic mass   Elevated bilirubin   Abnormal CT of the abdomen   Elevated liver enzymes   Protein-calorie malnutrition, severe   Adenocarcinoma of pancreas (HCC)   Bilateral knee swelling   1. obstructive jaundice secondary to pancreatic head mass/pancreatic adenocarcinoma - MRCP done with concerns for pancreatic head mass associated with severe dorsal pancreatic duct and biliary dilatation. - Patient seen in consultation by GI and underwent ERCP however unable to pass guidewire and IR consulted for percutaneous drain placement which was done 03/17/2024. - CA 19-9 noted to be elevated at 2010 - Cytology done with no malignant cells identified. - Patient being followed by GI and patient s/p EUS and  FNA, 03/24/2024 with biopsy consistent with adenocarcinoma.  - Oncology consulted.  GI. - Patient noted to have developed a fever 03/24/2024 and subsequently placed empirically on IV Unasyn  to complete a 7-day course per GI recommendations.   2.  AKI on CKD stage IIIa - Baseline creatinine noted at 1.3-1.5 in 2024 and October 2025. - CT done with no hydronephrosis and bladder thickening possible due to bladder outlet obstruction. - AKI felt likely secondary to volume depletion and bladder outlet obstruction. - Foley catheter discontinued on 03/25/2024 as patient noted to have fevers. - Patient noted to fail voiding trial and Foley catheter placed back in, 03/26/2024. - Nephrology was following and feel patient will need urology outpatient follow-up.   3.  Metabolic acidosis - Resolved.   4.  MGUS with normocytic anemia - Patient with no overt bleeding. - Status posttransfusion 2 units PRBCs with hemoglobin currently at 7.7 from 8.5 from 9.2 from 6.8 (03/23/2024). - Transfuse for hemoglobin < 7.   5.  CAD - Antiplatelets on hold and resume once cleared by GI and post procedures.   6.  Glaucoma - Eyedrops.    7.  Fever - Patient noted on 03/24/2024 to have a fever with a temp as high as 102.6.   - Leukocytosis noted to trend up transiently and started to trend back down.  - Patient with EUS with instrumentation and biopsies taken 03/24/2024. - Blood cultures obtained 03/24/2024 with no growth to date.  - Urinalysis obtained with negative leukocytes, nitrite negative, 0-5 WBCs. Urine culture is negative. - Cultures obtained from bile 03/25/2024 with moderate Candida glabrata, few  Enterococcus faecalis, unsure of significance as patient afebrile, normal WBCs, fever curve trended down on empiric IV Unasyn .  - Patient underwent voiding trial 03/25/2024 which he failed and Foley catheter placed back in.  - Lipase levels obtained per GI noted to be elevated at 115 however patient with no abdominal  pain and tolerating oral intake. - Continue empiric IV Unasyn  to complete a 7-day course per GI recommendations.    8.  Urinary retention - Foley catheter removed 03/25/2024, however patient with difficulty emptying out his bladder intermittently. - Patient noted to have continued urinary retention and Foley catheter had to be placed back in the evening of 03/26/2024. - Continue Flomax .   - Will need to be discharged with Foley catheter in place with outpatient follow-up with urology.    9.  Bilateral knee swelling and pain - Patient noted with bilateral swelling of his knees L > right. - Patient noted with prior history of pyogenic arthritis in 2017 requiring antibiotics. - Orthopedics consulted and patient seen by Bertrum Gaskins, PA and patient underwent aspiration of bilateral knees with left knee 50 cc of synovial slightly turbid fluid obtained, right knee with yellow slightly turbid fluid with 30 cc obtained and sent for cell count and cultures. - Cultures with no growth so far to date. - White cell count noted on the right knee with 530, white count noted on the left knee with 19,740, no crystals seen. - Orthopedics feel more consistent with inflammatory arthropathy. - Patient currently empirically on IV Unasyn . - Appreciate orthopedic input and recommendations.   10.  Severe protein calorie malnutrition - Likely secondary to newly diagnosed pancreatic adenocarcinoma. - Nutritional supplementation.   11. Hypokalemia - Replace       DVT prophylaxis:  heparin  injection 5,000 Units Start: 03/25/24 1400 SCDs Start: 03/15/24 1637  Code Status: Full code Family Communication: Friend at bedside Disposition Plan: Home Status is: Inpatient Remains inpatient appropriate because: Awaiting further oncology recommendations    Antimicrobials:  Anti-infectives (From admission, onward)    Start     Dose/Rate Route Frequency Ordered Stop   03/28/24 1515  micafungin (MYCAMINE) 200 mg in  sodium chloride  0.9 % 100 mL IVPB  Status:  Discontinued        200 mg 110 mL/hr over 1 Hours Intravenous Every 24 hours 03/28/24 1428 03/28/24 1428   03/25/24 1400  Ampicillin -Sulbactam (UNASYN ) 3 g in sodium chloride  0.9 % 100 mL IVPB  Status:  Discontinued        3 g 200 mL/hr over 30 Minutes Intravenous Every 6 hours 03/25/24 1232 03/25/24 1350   03/25/24 1400  Ampicillin -Sulbactam (UNASYN ) 3 g in sodium chloride  0.9 % 100 mL IVPB        3 g 200 mL/hr over 30 Minutes Intravenous Every 12 hours 03/25/24 1350     03/17/24 1615  cefTRIAXone  (ROCEPHIN ) 2 g in sodium chloride  0.9 % 100 mL IVPB        2 g 200 mL/hr over 30 Minutes Intravenous  Once 03/17/24 1517 03/20/24 1256   03/15/24 2000  piperacillin -tazobactam (ZOSYN ) IVPB 2.25 g  Status:  Discontinued        2.25 g 100 mL/hr over 30 Minutes Intravenous Every 8 hours 03/15/24 1555 03/21/24 1129   03/15/24 0800  piperacillin -tazobactam (ZOSYN ) IVPB 3.375 g  Status:  Discontinued        3.375 g 12.5 mL/hr over 240 Minutes Intravenous Every 12 hours 03/15/24 0520 03/15/24 1555   03/15/24 0600  piperacillin -tazobactam (ZOSYN ) IVPB 2.25 g  Status:  Discontinued       Placed in Followed by Linked Group   2.25 g 100 mL/hr over 30 Minutes Intravenous Every 6 hours 03/14/24 2217 03/15/24 0519   03/14/24 2230  piperacillin -tazobactam (ZOSYN ) IVPB 3.375 g       Placed in Followed by Linked Group   3.375 g 100 mL/hr over 30 Minutes Intravenous  Once 03/14/24 2217 03/15/24 0605        Objective: Vitals:   03/28/24 2010 03/29/24 0500 03/29/24 0550 03/29/24 1321  BP: 133/72  118/69 117/68  Pulse: 94  75 90  Resp: 18  16   Temp: 98.7 F (37.1 C)  98.3 F (36.8 C) 98.5 F (36.9 C)  TempSrc: Oral  Oral Oral  SpO2: 100%  100% 100%  Weight:  61.7 kg    Height:        Intake/Output Summary (Last 24 hours) at 03/29/2024 1544 Last data filed at 03/29/2024 1321 Gross per 24 hour  Intake 722 ml  Output 1600 ml  Net -878 ml   Filed  Weights   03/26/24 0636 03/27/24 0608 03/29/24 0500  Weight: 61.5 kg 59.1 kg 61.7 kg    Examination:  General exam: Appears calm and comfortable  Respiratory system: Clear to auscultation. Respiratory effort normal. No respiratory distress. No conversational dyspnea.  Cardiovascular system: S1 & S2 heard, RRR. No murmurs. No pedal edema. Gastrointestinal system: Abdomen is nondistended, soft and nontender. Normal bowel sounds heard. Central nervous system: Alert and oriented. No focal neurological deficits. Speech clear.  Extremities: Symmetric in appearance  Skin: No rashes, lesions or ulcers on exposed skin  Psychiatry: Judgement and insight appear normal. Mood & affect appropriate.   Data Reviewed: I have personally reviewed following labs and imaging studies  CBC: Recent Labs  Lab 03/23/24 0422 03/23/24 1735 03/24/24 2134 03/25/24 0334 03/26/24 0406 03/27/24 0342 03/28/24 0358 03/29/24 0413  WBC 9.5   < > 8.8 11.5* 10.4 10.2 8.2 7.4  NEUTROABS 6.1  --  6.7  --   --  7.0  --  4.7  HGB 6.8*   < > 9.6* 9.1* 9.2* 8.5* 7.7* 7.9*  HCT 19.1*   < > 27.2* 26.7* 26.6* 24.7* 21.9* 22.3*  MCV 88.8   < > 88.9 90.8 89.3 90.8 88.7 89.6  PLT 252   < > 241 235 251 250 276 311   < > = values in this interval not displayed.   Basic Metabolic Panel: Recent Labs  Lab 03/25/24 0334 03/26/24 0406 03/27/24 0342 03/28/24 0358 03/29/24 0413  NA 138 135 138 138 139  K 3.8 3.5 3.6 3.7 3.3*  CL 105 101 103 105 103  CO2 25 24 25 25 24   GLUCOSE 96 135* 107* 118* 130*  BUN 15 17 21 19 20   CREATININE 1.80* 1.68* 1.76* 1.62* 1.64*  CALCIUM  9.4 9.5 9.3 9.2 9.6  MG  --   --   --   --  2.4  PHOS  --   --   --   --  4.2   GFR: Estimated Creatinine Clearance: 28.7 mL/min (A) (by C-G formula based on SCr of 1.64 mg/dL (H)). Liver Function Tests: Recent Labs  Lab 03/25/24 0334 03/26/24 0406 03/27/24 0342 03/28/24 0358 03/29/24 0413  AST 48* 41 36 39 39  ALT 55* 44 41 40 42  ALKPHOS 686*  613* 557* 607* 636*  BILITOT 3.9* 3.6* 3.5* 2.7* 2.4*  PROT 6.4* 6.4* 6.3*  6.3* 6.5  ALBUMIN  3.0* 2.9* 2.7* 2.7* 2.8*   Recent Labs  Lab 03/25/24 0334 03/26/24 0406 03/27/24 0342  LIPASE 115* 88* 101*   No results for input(s): AMMONIA in the last 168 hours. Coagulation Profile: No results for input(s): INR, PROTIME in the last 168 hours. Cardiac Enzymes: No results for input(s): CKTOTAL, CKMB, CKMBINDEX, TROPONINI in the last 168 hours. BNP (last 3 results) No results for input(s): PROBNP in the last 8760 hours. HbA1C: No results for input(s): HGBA1C in the last 72 hours. CBG: No results for input(s): GLUCAP in the last 168 hours. Lipid Profile: No results for input(s): CHOL, HDL, LDLCALC, TRIG, CHOLHDL, LDLDIRECT in the last 72 hours. Thyroid  Function Tests: No results for input(s): TSH, T4TOTAL, FREET4, T3FREE, THYROIDAB in the last 72 hours. Anemia Panel: No results for input(s): VITAMINB12, FOLATE, FERRITIN, TIBC, IRON , RETICCTPCT in the last 72 hours. Sepsis Labs: Recent Labs  Lab 03/24/24 2134 03/24/24 2313  LATICACIDVEN 1.5 1.9    Recent Results (from the past 240 hours)  Culture, blood (x 2)     Status: None (Preliminary result)   Collection Time: 03/24/24  9:34 PM   Specimen: BLOOD RIGHT ARM  Result Value Ref Range Status   Specimen Description BLOOD RIGHT ARM  Final   Special Requests   Final    BOTTLES DRAWN AEROBIC ONLY Blood Culture adequate volume   Culture   Final    NO GROWTH 4 DAYS Performed at [facility], [address], [place], [place] [address]    Report Status PENDING  Incomplete  Culture, blood (x 2)     Status: None (Preliminary result)   Collection Time: 03/24/24  9:34 PM   Specimen: BLOOD LEFT ARM  Result Value Ref Range Status   Specimen Description   Final    BLOOD LEFT ARM Performed at [facility], [address], [place], [place] [address]     Special Requests   Final    BOTTLES DRAWN AEROBIC AND ANAEROBIC Blood Culture adequate volume Performed at [facility], [address] [place] [address], [place], [place] [address]    Culture   Final    NO GROWTH 4 DAYS Performed at [facility], [address] [place], [place] [address]    Report Status PENDING  Incomplete  Body fluid culture w Gram Stain     Status: None   Collection Time: 03/25/24  9:49 AM   Specimen: BILE; Body Fluid  Result Value Ref Range Status   Specimen Description   Final    BILE Performed at [facility], [address] [place], [place] [address]    Special Requests   Final    Normal Performed at [facility], [address] [place], [place] [address]    Gram Stain   Final    NO WBC SEEN FEW BUDDING YEAST SEEN RARE GRAM POSITIVE RODS Performed at [facility], [address], [place], [place] [address]    Culture   Final    MODERATE CANDIDA GLABRATA FEW ENTEROCOCCUS FAECALIS    Report Status 03/28/2024 FINAL  Final   Organism ID, Bacteria ENTEROCOCCUS FAECALIS  Final      Susceptibility   Enterococcus faecalis - MIC*    AMPICILLIN  <=2 SENSITIVE Sensitive     VANCOMYCIN  1 SENSITIVE Sensitive     GENTAMICIN SYNERGY SENSITIVE Sensitive     * FEW ENTEROCOCCUS FAECALIS  Remove urinary catheter to obtain Clean  Catch urine culture     Status: None   Collection Time: 03/25/24  9:51 PM   Specimen: Urine, Clean Catch  Result Value Ref Range Status   Specimen Description   Final    URINE, CLEAN CATCH Performed at [facility], [address] [place], [place] [address]    Special Requests   Final    NONE Performed at [facility], [address], [place], [place] [address]    Culture   Final    NO GROWTH Performed at [facility], [address], [place], [place] [address]    Report Status 03/27/2024 FINAL  Final  Urine Culture     Status: None    Collection Time: 03/26/24 12:14 AM   Specimen: Urine, Catheterized  Result Value Ref Range Status   Specimen Description   Final    URINE, CATHETERIZED Performed at [facility], [address] [place], [place] [address]    Special Requests   Final    NONE Performed at [facility], [address], [place], [place] [address]    Culture   Final    NO GROWTH Performed at [facility], [address] [place], [place] [address]    Report Status 03/28/2024 FINAL  Final  Body fluid culture w Gram Stain     Status: None (Preliminary result)   Collection Time: 03/26/24  4:36 PM   Specimen: Synovium; Body Fluid  Result Value Ref Range Status   Specimen Description   Final    SYNOVIAL RIGHT KNEE Performed at [facility] [address], [place], [place] [address]    Special Requests   Final    Normal Performed at [facility], [address], [place], [place] [address]    Gram Stain   Final    RARE WBC PRESENT, PREDOMINANTLY PMN NO ORGANISMS SEEN    Culture   Final    NO GROWTH 3 DAYS Performed at [facility], [address] [place], [place] [address]    Report Status PENDING  Incomplete  Body fluid culture w Gram Stain     Status: None (Preliminary result)   Collection Time: 03/26/24  4:37 PM   Specimen: Synovium; Body Fluid  Result Value Ref Range Status   Specimen Description   Final    SYNOVIAL LEFT KNEE Performed at [facility], [address] [place], [place] [address]    Special Requests   Final    Normal Performed at [facility], [address] [place], [place] [address]    Gram Stain   Final    FEW WBC PRESENT, PREDOMINANTLY PMN NO ORGANISMS SEEN    Culture   Final    NO GROWTH 3 DAYS Performed at [facility], [address], [place], [place] [address]    Report Status PENDING  Incomplete      Radiology Studies: No  results found.    Scheduled Meds:  carbamide peroxide  5 drop Both EARS BID   Chlorhexidine  Gluconate Cloth  6 each Topical Daily   diclofenac  Sodium  2 g Topical TID   feeding supplement (GLUCERNA SHAKE)  237 mL Oral TID BM   heparin  injection (subcutaneous)  5,000 Units Subcutaneous Q8H   latanoprost   1 drop Both Eyes Daily   senna  1 tablet Oral BID   sodium chloride  flush  5 mL Intracatheter Q8H  tamsulosin   0.4 mg Oral QPC supper   Continuous Infusions:  ampicillin -sulbactam (UNASYN ) IV 3 g (03/29/24 1326)     LOS: 14 days   Time spent: 25 minutes   Delon Hoe, DO [facility] 03/29/2024, 3:44 PM   Available via Epic secure chat 7am-7pm After these hours, please refer to coverage provider listed on [email or web address]  "

## 2024-03-29 NOTE — Progress Notes (Signed)
 Physical Therapy Treatment Patient Details Name: Luis Chen MRN: [id number] DOB: 1941/02/04 Today's Date: 03/29/2024   History of Present Illness Luis Chen is an 84 y.o. male presents with concern for low blood pressure and lightheadedness. Abdominal MRI/MRCP with and without contrast identified a 3.3 x 2.3 x 2.1 cm pancreatic head mass associated with several dorsal pancreatic duct and biliary dilatation suspicious for pancreatic adenocarcinoma.  Mild gallbladder distention and mild gallbladder wall thickening also noted. S/P PERCUTANEOUS TRANSHEPATIC BILIARY TUBE PLACEMENT1/9/26. orthopedics consulted d/t knee pain and swelling. pt had bil knees aspirated  on 03/27/24.  PMH: chronic kidney disease stage IIIb, coronary artery disease, MGUS, hyperlipidemia    PT Comments  Pt is mobilizing well, he ambulated 400' with RW, no loss of balance. Pt performed standing BLE strengthening exercises. Pt tolerated tx well.     If plan is discharge home, recommend the following:     Can travel by private vehicle        Equipment Recommendations  None recommended by PT    Recommendations for Other Services       Precautions / Restrictions Precautions Precautions: Fall Recall of Precautions/Restrictions: Intact Restrictions Weight Bearing Restrictions Per Provider Order: No     Mobility  Bed Mobility                    Transfers Overall transfer level: Needs assistance Equipment used: Rolling walker (2 wheels) Transfers: Sit to/from Stand Sit to Stand: Supervision           General transfer comment: cues for hand placement    Ambulation/Gait Ambulation/Gait assistance: Supervision Gait Distance (Feet): 400 Feet Assistive device: Rolling walker (2 wheels) Gait Pattern/deviations: Narrow base of support, Step-through pattern, Decreased stride length Gait velocity: WFL     General Gait Details: steady with RW which was utilized for Mudlogger     Tilt Bed    Modified Rankin (Stroke Patients Only)       Balance Overall balance assessment: No apparent balance deficits (not formally assessed)                                          Communication Communication Communication: No apparent difficulties  Cognition Arousal: Alert Behavior During Therapy: WFL for tasks assessed/performed   PT - Cognitive impairments: No apparent impairments                         Following commands: Intact      Cueing    Exercises General Exercises - Lower Extremity Hip Flexion/Marching: AROM, Both, 10 reps, Standing Heel Raises: AROM, Both, 10 reps, Standing Mini-Sqauts: AROM, Both, 10 reps, Standing    General Comments        Pertinent Vitals/Pain Pain Assessment Pain Assessment: No/denies pain    Home Living                          Prior Function            PT Goals (current goals can now be found in the care plan section) Acute Rehab PT Goals Patient Stated Goal: return home, resume bowling PT Goal Formulation: With patient/family Time For Goal Achievement: 03/30/24 Potential to Achieve Goals: Good Progress  towards PT goals: Progressing toward goals    Frequency    Min 2X/week      PT Plan      Co-evaluation              AM-PAC PT 6 Clicks Mobility   Outcome Measure  Help needed turning from your back to your side while in a flat bed without using bedrails?: None Help needed moving from lying on your back to sitting on the side of a flat bed without using bedrails?: None Help needed moving to and from a bed to a chair (including a wheelchair)?: None Help needed standing up from a chair using your arms (e.g., wheelchair or bedside chair)?: None Help needed to walk in hospital room?: None Help needed climbing 3-5 steps with a railing? : A Little 6 K[place]a Score: 23    End of Session Equipment Utilized During  Treatment: Gait belt Activity Tolerance: Patient tolerated treatment well Patient left: in chair;with call bell/phone within reach;with chair alarm set Nurse Communication: Mobility status PT Visit Diagnosis: Other abnormalities of gait and mobility (R26.89)     Time: 8440-8381 PT Time Calculation (min) (ACUTE ONLY): 19 min  Charges:    $Gait Training: 8-22 mins PT General Charges $$ ACUTE PT VISIT: 1 Visit                     Sylvan Delon Copp PT 03/29/2024  Acute Rehabilitation Services  Office [phone]

## 2024-03-30 ENCOUNTER — Other Ambulatory Visit (HOSPITAL_COMMUNITY): Payer: Self-pay

## 2024-03-30 ENCOUNTER — Encounter (HOSPITAL_COMMUNITY): Payer: Self-pay | Admitting: *Deleted

## 2024-03-30 DIAGNOSIS — K831 Obstruction of bile duct: Secondary | ICD-10-CM | POA: Diagnosis not present

## 2024-03-30 LAB — COMPREHENSIVE METABOLIC PANEL WITH GFR
ALT: 47 U/L — ABNORMAL HIGH (ref 0–44)
AST: 43 U/L — ABNORMAL HIGH (ref 15–41)
Albumin: 2.9 g/dL — ABNORMAL LOW (ref 3.5–5.0)
Alkaline Phosphatase: 809 U/L — ABNORMAL HIGH (ref 38–126)
Anion gap: 10 (ref 5–15)
BUN: 21 mg/dL (ref 8–23)
CO2: 24 mmol/L (ref 22–32)
Calcium: 9.6 mg/dL (ref 8.9–10.3)
Chloride: 105 mmol/L (ref 98–111)
Creatinine, Ser: 1.61 mg/dL — ABNORMAL HIGH (ref 0.61–1.24)
GFR, Estimated: 42 mL/min — ABNORMAL LOW
Glucose, Bld: 121 mg/dL — ABNORMAL HIGH (ref 70–99)
Potassium: 4 mmol/L (ref 3.5–5.1)
Sodium: 139 mmol/L (ref 135–145)
Total Bilirubin: 2.3 mg/dL — ABNORMAL HIGH (ref 0.0–1.2)
Total Protein: 6.7 g/dL (ref 6.5–8.1)

## 2024-03-30 LAB — CULTURE, BLOOD (ROUTINE X 2)
Culture: NO GROWTH
Culture: NO GROWTH
Special Requests: ADEQUATE
Special Requests: ADEQUATE

## 2024-03-30 LAB — BODY FLUID CULTURE W GRAM STAIN
Culture: NO GROWTH
Culture: NO GROWTH
Special Requests: NORMAL
Special Requests: NORMAL

## 2024-03-30 LAB — CBC
HCT: 23.5 % — ABNORMAL LOW (ref 39.0–52.0)
Hemoglobin: 8 g/dL — ABNORMAL LOW (ref 13.0–17.0)
MCH: 31.1 pg (ref 26.0–34.0)
MCHC: 34 g/dL (ref 30.0–36.0)
MCV: 91.4 fL (ref 80.0–100.0)
Platelets: 331 K/uL (ref 150–400)
RBC: 2.57 MIL/uL — ABNORMAL LOW (ref 4.22–5.81)
RDW: 17.1 % — ABNORMAL HIGH (ref 11.5–15.5)
WBC: 7.3 K/uL (ref 4.0–10.5)
nRBC: 0 % (ref 0.0–0.2)

## 2024-03-30 MED ORDER — TAMSULOSIN HCL 0.4 MG PO CAPS
0.4000 mg | ORAL_CAPSULE | Freq: Every day | ORAL | 0 refills | Status: AC
  Filled 2024-03-30: qty 30, 30d supply, fill #0

## 2024-03-30 MED ORDER — AMOXICILLIN-POT CLAVULANATE 875-125 MG PO TABS
1.0000 | ORAL_TABLET | Freq: Two times a day (BID) | ORAL | 0 refills | Status: AC
  Filled 2024-03-30: qty 4, 2d supply, fill #0

## 2024-03-30 NOTE — Discharge Summary (Signed)
 Physician Discharge Summary  [name] FMW:[id number] DOB: [birth year] DOA: 03/14/2024  PCP: [name], MD  Admit date: 03/14/2024 Discharge date: 03/30/2024  Admitted From: Home Disposition: Home  Recommendations for Outpatient Follow-up:  Follow up with PCP  Follow up with oncology, recommended outpatient PET/CT to evaluate for possible metastatic disease [facility]-up with [facility] regarding Foley catheter and voiding trial (failed voiding trial in the hospital) Follow-up with Dr. Vernetta, orthopedic surgery as needed Follow up with IR clinic 6-8 weeks for drain exchanges. Patient will need to flush drain once daily and change dressing every 3-5 days or PRN if soiled.   Discharge Condition: Stable CODE STATUS: Full code Diet recommendation: Regular  Brief/Interim Summary: Luis Chen is a 84 y.o. male with PMH of CKD stage III, CAD, MGUS, HLD who presented with lightheadedness and low blood pressure, and weight loss over the last few weeks. Blood work in the ED showed elevated LFTs, significantly elevated alkaline phosphatase and bilirubin. CT abdomen/pelvis showed concern for pancreatic mass , labs showed AKI.  Gastroenterology and nephrology IR has been consulted.  S/p ERCP but unable to pass guidewire, now s/p perc biliary drain by IR.  Patient subsequently underwent a EUS with biopsies positive for pancreatic adenocarcinoma.  Oncology consulted.  Discharge Diagnoses:   Principal Problem:   Obstructive jaundice (HCC) Active Problems:   Anemia, chronic disease   Chronic kidney disease (CKD), stage III (moderate) (HCC)   MGUS (monoclonal gammopathy of unknown significance)   Coronary artery disease involving native coronary artery of native heart with other form of angina pectoris   Transaminitis   Pancreatic mass   Elevated bilirubin   Abnormal CT of the abdomen   Elevated liver enzymes   Protein-calorie malnutrition, severe   Adenocarcinoma of pancreas  (HCC)   Bilateral knee swelling   1. Obstructive jaundice secondary to pancreatic head mass/pancreatic adenocarcinoma - MRCP done with concerns for pancreatic head mass associated with severe dorsal pancreatic duct and biliary dilatation. - Patient seen in consultation by GI and underwent ERCP however unable to pass guidewire and IR consulted for percutaneous drain placement which was done 03/17/2024. - CA 19-9 noted to be elevated at 2010 - Cytology done with no malignant cells identified. - Patient being followed by GI and patient s/p EUS and FNA, 03/24/2024 with biopsy consistent with adenocarcinoma.  - Oncology consulted.  GI. - Patient noted to have developed a fever 03/24/2024 and subsequently placed empirically on IV Unasyn  to complete a 7-day course per GI recommendations    2.  AKI on CKD stage IIIa - Baseline creatinine noted at 1.3-1.5 in 2024 and October 2025. - CT done with no hydronephrosis and bladder thickening possible due to bladder outlet obstruction. - AKI felt likely secondary to volume depletion and bladder outlet obstruction. - Foley catheter discontinued on 03/25/2024 as patient noted to have fevers. - Patient noted to fail voiding trial and Foley catheter placed back in, 03/26/2024. - Nephrology was following and feel patient will need urology outpatient follow-up.   3.  Metabolic acidosis - Resolved.   4.  MGUS with normocytic anemia - Patient with no overt bleeding. - Status posttransfusion 2 units PRBCs with hemoglobin currently at 7.7 from 8.5 from 9.2 from 6.8 (03/23/2024). - Transfuse for hemoglobin < 7.   5.  CAD - Resume antiplatelet therapy   6.  Glaucoma - Eyedrops.    7.  Fever - Patient noted on 03/24/2024 to have a fever with a temp as  high as 102.6.   - Leukocytosis noted to trend up transiently and started to trend back down.  - Patient with EUS with instrumentation and biopsies taken 03/24/2024. - Blood cultures obtained 03/24/2024 with no growth  to date.  - Urinalysis obtained with negative leukocytes, nitrite negative, 0-5 WBCs. Urine culture is negative. - Cultures obtained from bile 03/25/2024 with moderate Candida glabrata, few Enterococcus faecalis, unsure of significance as patient afebrile, normal WBCs, fever curve trended down on empiric IV Unasyn .  - Patient underwent voiding trial 03/25/2024 which he failed and Foley catheter placed back in.  - Lipase levels obtained per GI noted to be elevated at 115 however patient with no abdominal pain and tolerating oral intake. - Continue empiric IV Unasyn  --> Augmentin  to complete a 7-day course per GI recommendations.    8.  Urinary retention - Foley catheter removed 03/25/2024, however patient with difficulty emptying out his bladder intermittently. - Patient noted to have continued urinary retention and Foley catheter had to be placed back in the evening of 03/26/2024. - Continue Flomax .   - Will need to be discharged with Foley catheter in place with outpatient follow-up with urology.    9.  Bilateral knee swelling and pain - Patient noted with bilateral swelling of his knees L > right. - Patient noted with prior history of pyogenic arthritis in 2017 requiring antibiotics. - Orthopedics consulted and patient seen by Bertrum Gaskins, PA and patient underwent aspiration of bilateral knees with left knee 50 cc of synovial slightly turbid fluid obtained, right knee with yellow slightly turbid fluid with 30 cc obtained and sent for cell count and cultures. - Cultures with no growth so far to date. - White cell count noted on the right knee with 530, white count noted on the left knee with 19,740, no crystals seen. - Orthopedics feel more consistent with inflammatory arthropathy. - Patient currently empirically on IV Unasyn . - Appreciate orthopedic input and recommendations.   10.  Severe protein calorie malnutrition - Likely secondary to newly diagnosed pancreatic adenocarcinoma. -  Nutritional supplementation    Discharge Instructions  Discharge Instructions     Ambulatory referral to Urology   Complete by: As directed    [facility]   Call MD for:  difficulty breathing, headache or visual disturbances   Complete by: As directed    Call MD for:  extreme fatigue   Complete by: As directed    Call MD for:  hives   Complete by: As directed    Call MD for:  persistant dizziness or light-headedness   Complete by: As directed    Call MD for:  persistant nausea and vomiting   Complete by: As directed    Call MD for:  redness, tenderness, or signs of infection (pain, swelling, redness, odor or green/yellow discharge around incision site)   Complete by: As directed    Call MD for:  severe uncontrolled pain   Complete by: As directed    Call MD for:  temperature >100.4   Complete by: As directed    Continue Urinary Catheter   Complete by: As directed    Discharge with foley in place   Discharge instructions   Complete by: As directed    You were cared for by a hospitalist during your hospital stay. If you have any questions about your discharge medications or the care you received while you were in the hospital after you are discharged, you can call the unit and ask to  speak with the hospitalist on call if the hospitalist that took care of you is not available. Once you are discharged, your primary care physician will handle any further medical issues. Please note that NO REFILLS for any discharge medications will be authorized once you are discharged, as it is imperative that you return to your primary care physician (or establish a relationship with a primary care physician if you do not have one) for your aftercare needs so that they can reassess your need for medications and monitor your lab values.   Increase activity slowly   Complete by: As directed    No dressing needed   Complete by: As directed       Allergies as of 03/30/2024        Reactions   Ace Inhibitors Swelling, Other (See Comments)   Facial swelling    Entresto  [sacubitril-valsartan] Swelling, Other (See Comments)   Angiotensin Receptor Neprilysin Inhibitor         Medication List     STOP taking these medications    carvedilol  6.25 MG tablet Commonly known as: COREG    ezetimibe  10 MG tablet Commonly known as: ZETIA    losartan  50 MG tablet Commonly known as: COZAAR    rosuvastatin  40 MG tablet Commonly known as: CRESTOR    spironolactone  25 MG tablet Commonly known as: ALDACTONE    traZODone  150 MG tablet Commonly known as: DESYREL        TAKE these medications    amoxicillin -clavulanate 875-125 MG tablet Commonly known as: AUGMENTIN  Take 1 tablet by mouth 2 (two) times daily for 2 days.   aspirin  EC 81 MG tablet Take 1 tablet (81 mg total) by mouth daily.   diclofenac  Sodium 1 % Gel Commonly known as: VOLTAREN  2 g 4 (four) times daily as needed (for pain- left knee and any other affected area).   Ferrous Gluconate  324 (37.5 Fe) MG Tabs TAKE 1 TABLET BY MOUTH TWICE DAILY WITH MEALS What changed:  when to take this additional instructions   KETOTIFEN FUMARATE OP Place 1 drop into both eyes See admin instructions. Ketotifen 0.025% eye drops - Instill 1 drop into both eyes one to two times a day   latanoprost  0.005 % ophthalmic solution Commonly known as: XALATAN  Place 1 drop into both eyes at bedtime.   oxyCODONE -acetaminophen  5-325 MG tablet Commonly known as: PERCOCET/ROXICET Take 1 tablet by mouth every 6 (six) hours as needed for severe pain (pain score 7-10).   Suplena/Carb Steady Liqd Take 237 mLs by mouth See admin instructions. Drink 237 ml's of Vanilla Suplena by mouth three times a day   tamsulosin  0.4 MG Caps capsule Commonly known as: FLOMAX  Take 1 capsule (0.4 mg total) by mouth daily after supper.   trolamine salicylate 10 % cream Commonly known as: ASPERCREME Apply 1 Application topically as needed (for  left knee pain and/or other affected areas).   Vitamin D3 1000 units Caps Take 1,000 Units by mouth daily.               Discharge Care Instructions  (From admission, onward)           Start     Ordered   03/30/24 0000  No dressing needed        03/30/24 9048            Follow-up Information     [name], MD Follow up.   Specialty: Family Medicine Why: Hospital follow up within 1 week Contact information: [address]  [address] [place] [address] [phone]         Neomi Johnston DASEN, PA-C Follow up.   Specialty: Hematology and Oncology Contact information: [address] [place] [address] [phone]         [facility]. Call.   Why: Outpatient referral placed for foley catheter follow up Contact information: [address] [place] [address] [place]  [address] [phone]        Vernetta Lonni GRADE, MD Follow up.   Specialty: Orthopedic Surgery Why: As needed, If symptoms worsen for knee pain Contact information: [address]  [place] [address] [phone]         [facility] RADIOLOG[facility] Follow up.   Specialty: Radiology Contact information: [address] [place]  [address] [phone]               Allergies[1]   Procedures/Studies: IR INT EXT BILIARY DRAIN WITH CHOLANGIOGRAM Result Date: 03/18/2024 INDICATION: Biliary dilatation.  Aborted ERCP. Briefly, 84 year old male with distal CBD obstruction, suspected malignant. ERCP aborted secondary to inability to cannulate biliary tree. EXAM: Procedures; 1. PERCUTANEOUS TRANSHEPATIC CHOLANGIOGRAM 2. ULTRASOUND AND FLUOROSCOPIC GUIDED BILIARY DRAINAGE TUBE PLACEMENT COMPARISON:  MRCP, 03/15/2024. ERCP fluoroscopy, earlier same day. CT AP, 03/14/2024. MEDICATIONS: Rocephin  2 gm IV; The antibiotic was administered with an appropriate time frame prior to the initiation of  the procedure 25 mg Demerol  IV CONTRAST:  20 mL Omnipaque  300-administered into the biliary tree. ANESTHESIA/SEDATION: Moderate (conscious) sedation was employed during this procedure. A total of Versed  2 mg and Fentanyl  100 mcg was administered intravenously. Moderate Sedation Time: 40 minutes. The patient's level of consciousness and vital signs were monitored continuously by radiology nursing throughout the procedure under my direct supervision. FLUOROSCOPY: Radiation Exposure Index and estimated peak skin dose (PSD); Reference air kerma (RAK), 41 mGy. COMPLICATIONS: None immediate. TECHNIQUE: Informed written consent was obtained from the patient and/or patient's representative after a discussion of the risks, benefits and alternatives to treatment. Questions regarding the procedure were encouraged and answered. A timeout was performed prior to the initiation of the procedure. The RIGHT upper abdominal quadrant was prepped and draped in the usual sterile fashion, and a sterile drape was applied covering the operative field. Maximum barrier sterile technique with sterile gowns and gloves were used for the procedure. A timeout was performed prior to the initiation of the procedure. Ultrasound scanning of the right upper abdominal quadrant was performed to delineate the anatomy and avoid transgression of the gallbladder or the pleura. After the overlying soft tissues were anesthetized with 1% Lidocaine  with epinephrine , under direct ultrasound guidance, a 22 gauge Chiba needle was utilized to cannulate a peripheral aspect of an intrahepatic biliary duct. A RIGHT-sided approach distal to the biliary hilum was targeted. Appropriate position was confirmed with limited contrast injection. Next, the duct was cannulated with a Nitrex wire and dilated with an Accustick set under fluoroscopic guidance. Limited cholangiograms were performed in various obliquities confirming appropriate access. Next, a 4 Fr angled glide  catheter was advanced through the outer sheath of the Accustick set and with the use of a stiff Glidewire, advanced through the biliary hilum, common bile duct and ampulla to the level of the duodenum. Contrast injection confirmed appropriate positioning. Under intermittent fluoroscopic guidance and over an Amplatz wire, the track was dilated ultimately allowing placement of a 10 Fr biliary drainage catheter with coil ultimately locked within the duodenum. Contrast was injected and a completion radiographs were obtained  in various obliquities. The catheter was connected to a drainage bag which yielded the brisk return of clear bile. The catheter was secured to the skin with an interrupted suture and StatLock device. Dressings were applied. The patient tolerated the procedure well without immediate postprocedural complication. FINDINGS: *Sonographic evaluation of the liver demonstrates intrahepatic biliary ductal dilatation as well as gallbladder distention. *Under direct ultrasound guidance, a dilated duct distal to the hilum from a RIGHT-sided approach was accessed *Placement of a 10 Fr biliary drainage catheter with end coiled and locked within the duodenum and radiopaque marker located proximal to the level of the biliary hilum. *Limited contrast injection demonstrates dilatation of the biliary tree, with obstruction at the distal the CBD. *20 mL of bile was submitted for microbiological analysis. IMPRESSION: 1. Successful placement of a 10 Fr percutaneous biliary drainage catheter via a RIGHT transhepatic approach, with end coiled and locked within the duodenum. 2. Distal common bile duct obstruction, with shouldering suspicious for malignancy. Bile submitted for microbiological and cytopathological analysis. RECOMMENDATIONS: The patient will return to [facility] ([facility]) for routine drainage catheter evaluation and exchange in 8 weeks. Thom Hall, MD [facility] Radiology Electronically Signed   By: Thom Hall M.D.   On: 03/18/2024 07:40   DG ERCP Result Date: 03/17/2024 CLINICAL DATA:  [id number] Surgery, elective [id number] EXAM: ERCP COMPARISON:  CT AP, 03/14/2024.  MRCP, 03/15/2024. FLUOROSCOPY: Exposure Index (as provided by the fluoroscopic device): 50.1 mGy Kerma FINDINGS: Limited oblique planar images of the RIGHT upper quadrant obtained C-arm. Images demonstrating flexible endoscopy. No submitted images for retrograde cholangiogram., IMPRESSION: Fluoroscopic imaging for ERCP. No submitted images for retrograde cholangiogram For complete description of intra procedural findings, please see performing service dictation. Electronically Signed   By: Thom Hall M.D.   On: 03/17/2024 13:53   MR ABDOMEN MRCP W WO CONTRAST Result Date: 03/15/2024 EXAM: MRCP WITH AND WITHOUT IV CONTRAST 03/15/2024 04:41:16 PM TECHNIQUE: Multisequence, multiplanar magnetic resonance images of the abdomen with and without intravenous contrast. MRCP sequences were performed. Acquisition workstation 3D images were acquired by the technologist using existing protocol; radiologist available for consultation but did not designate specific images to acquire in this individual case. COMPARISON: CT abdomen 03/14/2024. CLINICAL HISTORY: Biliary ductal dilation and transaminitis. Suspected pancreatic head mass on recent CT scan. FINDINGS: LIVER: 8 mm focus of restricted diffusion just above the gallbladder fossa on image 24 series 8, not well corroborated on other imaging sequences, possibly artifact although a small subtle metastatic lesion cannot be totally excluded . GALLBLADDER AND BILIARY SYSTEM: Severe intrahepatic biliary dilatation and common bile duct dilatation up to 1.5 cm extending to a mass in the head of the pancreas. Mildly distended gallbladder with mild gallbladder wall thickening; strictly speaking, cholecystitis is not excluded. SPLEEN: Unremarkable.  PANCREAS/PANCREATIC DUCT: Indistinctly marginated 3.3 x 2.3 x 2.1 cm mass in the head of the pancreas demonstrating relative isoenhancement on early arterial phase images and mildly accentuated enhancement on delayed images and also demonstrating substantial restriction of diffusion as on image 28 series 8. The dorsopancreatic duct is likewise prominently dilated extending into this mass. I don't see definite involvement of the celiac trunk or superior mesenteric artery. No definite invasion or encirclement of the portal vein or superior mesenteric vein (SMV). ADRENAL GLANDS: Unremarkable. KIDNEYS: Unremarkable. LYMPH NODES: No enlarged abdominal lymph nodes. VASCULATURE: No definite involvement of the celiac trunk or superior mesenteric artery. No definite invasion or encirclement of the portal vein or  superior mesenteric vein (SMV). PERITONEUM: No ascites. ABDOMINAL WALL: Left lumbar hernia on image 57 series 19 contains adipose tissue. No mass. BOWEL: Grossly unremarkable. No bowel obstruction. BONES: No acute abnormality or worrisome osseous lesion. SOFT TISSUES: Prior median sternotomy. Motion artifact is present, reducing diagnostic sensitivity and specificity. MISCELLANEOUS: Unremarkable. IMPRESSION: 1. 3.3 x 2.3 x 2.1 cm pancreatic head mass associated with severe dorsal pancreatic duct and biliary dilatation , high suspicion for pancreatic adenocarcinoma. Tissue diagnosis recommended. Although we are limited by spatial resolution and motion artifact, no definite involvement observed of the celiac trunk, superior mesenteric artery, portal vein, or superior mesenteric vein. The mass is very indistinctly marginated and poorly visible on most sequences but is more conspicuous on high b-value diffusion-weighted images. 2. 8 mm in diameter focus of restricted diffusion in the liver just above the gallbladder fossa on image 24, series 8, nonspecific, although small metastatic lesion is difficult to exclude . 3.  Mild gallbladder distention and mild gallbladder wall thickening; cholecystitis is not excluded. 4. Chronic and incidental findings include prior median sternotomy and a left lumbar hernia containing adipose tissue. Electronically signed by: Ryan Salvage MD MD 03/15/2024 05:11 PM EST RP Workstation: [id number]   MR 3D Recon At Scanner Result Date: 03/15/2024 EXAM: MRCP WITH AND WITHOUT IV CONTRAST 03/15/2024 04:41:16 PM TECHNIQUE: Multisequence, multiplanar magnetic resonance images of the abdomen with and without intravenous contrast. MRCP sequences were performed. Acquisition workstation 3D images were acquired by the technologist using existing protocol; radiologist available for consultation but did not designate specific images to acquire in this individual case. COMPARISON: CT abdomen 03/14/2024. CLINICAL HISTORY: Biliary ductal dilation and transaminitis. Suspected pancreatic head mass on recent CT scan. FINDINGS: LIVER: 8 mm focus of restricted diffusion just above the gallbladder fossa on image 24 series 8, not well corroborated on other imaging sequences, possibly artifact although a small subtle metastatic lesion cannot be totally excluded . GALLBLADDER AND BILIARY SYSTEM: Severe intrahepatic biliary dilatation and common bile duct dilatation up to 1.5 cm extending to a mass in the head of the pancreas. Mildly distended gallbladder with mild gallbladder wall thickening; strictly speaking, cholecystitis is not excluded. SPLEEN: Unremarkable. PANCREAS/PANCREATIC DUCT: Indistinctly marginated 3.3 x 2.3 x 2.1 cm mass in the head of the pancreas demonstrating relative isoenhancement on early arterial phase images and mildly accentuated enhancement on delayed images and also demonstrating substantial restriction of diffusion as on image 28 series 8. The dorsopancreatic duct is likewise prominently dilated extending into this mass. I don't see definite involvement of the celiac trunk or superior  mesenteric artery. No definite invasion or encirclement of the portal vein or superior mesenteric vein (SMV). ADRENAL GLANDS: Unremarkable. KIDNEYS: Unremarkable. LYMPH NODES: No enlarged abdominal lymph nodes. VASCULATURE: No definite involvement of the celiac trunk or superior mesenteric artery. No definite invasion or encirclement of the portal vein or superior mesenteric vein (SMV). PERITONEUM: No ascites. ABDOMINAL WALL: Left lumbar hernia on image 57 series 19 contains adipose tissue. No mass. BOWEL: Grossly unremarkable. No bowel obstruction. BONES: No acute abnormality or worrisome osseous lesion. SOFT TISSUES: Prior median sternotomy. Motion artifact is present, reducing diagnostic sensitivity and specificity. MISCELLANEOUS: Unremarkable. IMPRESSION: 1. 3.3 x 2.3 x 2.1 cm pancreatic head mass associated with severe dorsal pancreatic duct and biliary dilatation , high suspicion for pancreatic adenocarcinoma. Tissue diagnosis recommended. Although we are limited by spatial resolution and motion artifact, no definite involvement observed of the celiac trunk, superior mesenteric artery, portal vein, or superior mesenteric vein. The mass  is very indistinctly marginated and poorly visible on most sequences but is more conspicuous on high b-value diffusion-weighted images. 2. 8 mm in diameter focus of restricted diffusion in the liver just above the gallbladder fossa on image 24, series 8, nonspecific, although small metastatic lesion is difficult to exclude . 3. Mild gallbladder distention and mild gallbladder wall thickening; cholecystitis is not excluded. 4. Chronic and incidental findings include prior median sternotomy and a left lumbar hernia containing adipose tissue. Electronically signed by: Ryan Salvage MD MD 03/15/2024 05:11 PM EST RP Workstation: [id number]   CT ABDOMEN PELVIS WO CONTRAST Result Date: 03/14/2024 EXAM: CT ABDOMEN AND PELVIS WITHOUT CONTRAST 03/14/2024 06:52:08 PM TECHNIQUE: CT of  the abdomen and pelvis was performed without the administration of intravenous contrast. Multiplanar reformatted images are provided for review. Automated exposure control, iterative reconstruction, and/or weight-based adjustment of the mA/kV was utilized to reduce the radiation dose to as low as reasonably achievable. COMPARISON: None available. CLINICAL HISTORY: Abdominal pain, acute, nonlocalized; Hypotensive, elevated liver/bilirubin. Dizziness. Weight loss. FINDINGS: LOWER CHEST: Sternotomy wires. Mild dependent changes in the lung bases. LIVER: Diffuse intra- and extrahepatic bile duct dilatation. GALLBLADDER AND BILE DUCTS: The gallbladder is mildly distended. No gallstones are identified. Diffuse intra- and extrahepatic bile duct dilatation. SPLEEN: The spleen is normal. No nodules. PANCREAS: Pancreatic ductal dilatation with fullness in the head of the pancreas. Unenhanced evaluation is limited, but this likely represents a pancreatic mass measuring about 2.5 cm in diameter. Correlate with MRCP or contrast-enhanced CT abdomen and pelvis for further evaluation. ADRENAL GLANDS: No acute abnormality. KIDNEYS, URETERS AND BLADDER: Kidneys are normal. No stones in the kidneys or ureters. No hydronephrosis. No perinephric or periureteral stranding. The bladder wall is mildly thickened, likely due to outlet obstruction. GI AND BOWEL: Stomach, small bowel, and colon are not abnormally distended. No discrete wall thickening is appreciated. There is no bowel obstruction. Small left inguinal hernia containing small bowel without proximal obstruction. PERITONEUM AND RETROPERITONEUM: No ascites. No free air. VASCULATURE: Aorta is normal in caliber. Calcification of the aorta. No aneurysm. LYMPH NODES: No lymphadenopathy. REPRODUCTIVE ORGANS: The prostate gland is enlarged. Measuring 6.5 cm in diameter. BONES AND SOFT TISSUES: Degenerative changes in the spine. No focal bone lesions. Left lumbar triangle abdominal wall  hernia containing fat. IMPRESSION: 1. Fullness in the head of the pancreas with associated pancreatic and bile duct dilatation, likely representing a pancreatic adenocarcinoma measuring about 2.5 cm in diameter; unenhanced evaluation is limited, and MRCP or contrast-enhanced CT abdomen and pelvis is recommended for further evaluation. Electronically signed by: Elsie Gravely MD 03/14/2024 07:22 PM EST RP Workstation: [id number]      Discharge Exam: Vitals:   03/30/24 0531 03/30/24 0739  BP: 132/68 (!) 112/55  Pulse: 94 82  Resp: 18 (!) 22  Temp: 98.3 F (36.8 C) 98.9 F (37.2 C)  SpO2: 100% 99%    General: Pt is alert, awake, not in acute distress Cardiovascular: RRR, S1/S2 +, no edema Respiratory: CTA bilaterally, no wheezing, no rhonchi, no respiratory distress, no conversational dyspnea  Abdominal: Soft, NT, ND, bowel sounds + Extremities: no edema, no cyanosis Psych: Normal mood and affect, stable judgement and insight     The results of significant diagnostics from this hospitalization (including imaging, microbiology, ancillary and laboratory) are listed below for reference.     Microbiology: Recent Results (from the past 240 hours)  Culture, blood (x 2)     Status: None   Collection Time: 03/24/24  9:34 PM  Specimen: BLOOD RIGHT ARM  Result Value Ref Range Status   Specimen Description BLOOD RIGHT ARM  Final   Special Requests   Final    BOTTLES DRAWN AEROBIC ONLY Blood Culture adequate volume   Culture   Final    NO GROWTH 5 DAYS Performed at [facility], [address], [place], [place] [address]    Report Status 03/30/2024 FINAL  Final  Culture, blood (x 2)     Status: None   Collection Time: 03/24/24  9:34 PM   Specimen: BLOOD LEFT ARM  Result Value Ref Range Status   Specimen Description   Final    BLOOD LEFT ARM Performed at [facility], [address] [place], [place] [address]    Special Requests   Final    BOTTLES DRAWN  AEROBIC AND ANAEROBIC Blood Culture adequate volume Performed at [facility], [address] [place], [place] [address]    Culture   Final    NO GROWTH 5 DAYS Performed at [facility], [address] [place], [place] [address]    Report Status 03/30/2024 FINAL  Final  Body fluid culture w Gram Stain     Status: None   Collection Time: 03/25/24  9:49 AM   Specimen: BILE; Body Fluid  Result Value Ref Range Status   Specimen Description   Final    BILE Performed at [facility], [address], [place], [place] [address]    Special Requests   Final    Normal Performed at [facility], [address] [place], [place] [address]    Gram Stain   Final    NO WBC SEEN FEW BUDDING YEAST SEEN RARE GRAM POSITIVE RODS Performed at [facility], [address], [place], [place] [address]    Culture   Final    MODERATE CANDIDA GLABRATA FEW ENTEROCOCCUS FAECALIS    Report Status 03/28/2024 FINAL  Final   Organism ID, Bacteria ENTEROCOCCUS FAECALIS  Final      Susceptibility   Enterococcus faecalis - MIC*    AMPICILLIN  <=2 SENSITIVE Sensitive     VANCOMYCIN  1 SENSITIVE Sensitive     GENTAMICIN SYNERGY SENSITIVE Sensitive     * FEW ENTEROCOCCUS FAECALIS  Remove urinary catheter to obtain Clean Catch urine culture     Status: None   Collection Time: 03/25/24  9:51 PM   Specimen: Urine, Clean Catch  Result Value Ref Range Status   Specimen Description   Final    URINE, CLEAN CATCH Performed at [facility], [address], [place], [place] [address]    Special Requests   Final    NONE Performed at [facility], [address], [place], [place] [address]    Culture   Final    NO GROWTH Performed at [facility], [address] [place], [place] [address]    Report Status 03/27/2024 FINAL  Final  Urine Culture     Status: None   Collection Time: 03/26/24 12:14 AM    Specimen: Urine, Catheterized  Result Value Ref Range Status   Specimen Description   Final    URINE, CATHETERIZED Performed at [facility], [address], [place], [place] [address]    Special Requests   Final    NONE Performed at [facility], [address], [place], [place] [address]    Culture   Final  NO GROWTH Performed at [facility], [address], [place], [place] [address]    Report Status 03/28/2024 FINAL  Final  Body fluid culture w Gram Stain     Status: None   Collection Time: 03/26/24  4:36 PM   Specimen: Synovium; Body Fluid  Result Value Ref Range Status   Specimen Description   Final    SYNOVIAL RIGHT KNEE Performed at [facility], [address] [place], [place] [address]    Special Requests   Final    Normal Performed at [facility], [address], [place], [place] [address]    Gram Stain   Final    RARE WBC PRESENT, PREDOMINANTLY PMN NO ORGANISMS SEEN    Culture   Final    NO GROWTH 3 DAYS Performed at [facility], [address] [place], [place] [address]    Report Status 03/30/2024 FINAL  Final  Body fluid culture w Gram Stain     Status: None   Collection Time: 03/26/24  4:37 PM   Specimen: Synovium; Body Fluid  Result Value Ref Range Status   Specimen Description   Final    SYNOVIAL LEFT KNEE Performed at [facility], [address], [place], [place] [address]    Special Requests   Final    Normal Performed at [facility], [address], [place], [place] [address]    Gram Stain   Final    FEW WBC PRESENT, PREDOMINANTLY PMN NO ORGANISMS SEEN    Culture   Final    NO GROWTH 3 DAYS Performed at [facility], [address] [place], [place] [address]    Report Status 03/30/2024 FINAL  Final     Labs: BNP (last 3 results) No results for input(s): BNP in the last 8760 hours. Basic  Metabolic Panel: Recent Labs  Lab 03/26/24 0406 03/27/24 0342 03/28/24 0358 03/29/24 0413 03/30/24 0419  NA 135 138 138 139 139  K 3.5 3.6 3.7 3.3* 4.0  CL 101 103 105 103 105  CO2 24 25 25 24 24   GLUCOSE 135* 107* 118* 130* 121*  BUN 17 21 19 20 21   CREATININE 1.68* 1.76* 1.62* 1.64* 1.61*  CALCIUM  9.5 9.3 9.2 9.6 9.6  MG  --   --   --  2.4  --   PHOS  --   --   --  4.2  --    Liver Function Tests: Recent Labs  Lab 03/26/24 0406 03/27/24 0342 03/28/24 0358 03/29/24 0413 03/30/24 0419  AST 41 36 39 39 43*  ALT 44 41 40 42 47*  ALKPHOS 613* 557* 607* 636* 809*  BILITOT 3.6* 3.5* 2.7* 2.4* 2.3*  PROT 6.4* 6.3* 6.3* 6.5 6.7  ALBUMIN  2.9* 2.7* 2.7* 2.8* 2.9*   Recent Labs  Lab 03/25/24 0334 03/26/24 0406 03/27/24 0342  LIPASE 115* 88* 101*   No results for input(s): AMMONIA in the last 168 hours. CBC: Recent Labs  Lab 03/24/24 2134 03/25/24 0334 03/26/24 0406 03/27/24 0342 03/28/24 0358 03/29/24 0413 03/30/24 0419  WBC 8.8   < > 10.4 10.2 8.2 7.4 7.3  NEUTROABS 6.7  --   --  7.0  --  4.7  --   HGB 9.6*   < > 9.2* 8.5* 7.7* 7.9* 8.0*  HCT 27.2*   < > 26.6* 24.7* 21.9* 22.3* 23.5*  MCV 88.9   < > 89.3 90.8 88.7 89.6 91.4  PLT 241   < > 251 250 276 311 331   < > = values in this interval not displayed.   Cardiac Enzymes: No results for input(s): CKTOTAL, CKMB, CKMBINDEX, TROPONINI in the last 168 hours. BNP: Invalid input(s): POCBNP CBG: No results for input(s): GLUCAP in the last 168 hours. D-Dimer No results for input(s): DDIMER in the last 72 hours. Hgb A1c No results for input(s): HGBA1C in the last 72 hours. Lipid Profile No results for input(s): CHOL, HDL, LDLCALC, TRIG, CHOLHDL, LDLDIRECT in the last 72 hours. Thyroid  function studies No results for input(s): TSH, T4TOTAL, T3FREE, THYROIDAB in the last 72 hours.  Invalid input(s): FREET3 Anemia work up No results for input(s): VITAMINB12, FOLATE,  FERRITIN, TIBC, IRON , RETICCTPCT in the last 72 hours. Urinalysis    Component Value Date/Time   COLORURINE AMBER (A) 03/26/2024 0014   APPEARANCEUR HAZY (A) 03/26/2024 0014   LABSPEC 1.017 03/26/2024 0014   PHURINE 5.0 03/26/2024 0014   GLUCOSEU 50 (A) 03/26/2024 0014   HGBUR SMALL (A) 03/26/2024 0014   BILIRUBINUR NEGATIVE 03/26/2024 0014   BILIRUBINUR Negative 05/05/2019 0858   KETONESUR NEGATIVE 03/26/2024 0014   PROTEINUR 30 (A) 03/26/2024 0014   UROBILINOGEN 0.2 05/05/2019 0858   UROBILINOGEN 0.2 10/19/2008 0657   NITRITE NEGATIVE 03/26/2024 0014   LEUKOCYTESUR NEGATIVE 03/26/2024 0014   Sepsis Labs Recent Labs  Lab 03/27/24 0342 03/28/24 0358 03/29/24 0413 03/30/24 0419  WBC 10.2 8.2 7.4 7.3   Microbiology Recent Results (from the past 240 hours)  Culture, blood (x 2)     Status: None   Collection Time: 03/24/24  9:34 PM   Specimen: BLOOD RIGHT ARM  Result Value Ref Range Status   Specimen Description BLOOD RIGHT ARM  Final   Special Requests   Final    BOTTLES DRAWN AEROBIC ONLY Blood Culture adequate volume   Culture   Final    NO GROWTH 5 DAYS Performed at [facility], [address], [place], [place] [address]    Report Status 03/30/2024 FINAL  Final  Culture, blood (x 2)     Status: None   Collection Time: 03/24/24  9:34 PM   Specimen: BLOOD LEFT ARM  Result Value Ref Range Status   Specimen Description   Final    BLOOD LEFT ARM Performed at [facility], [address] [place], [place] [address]    Special Requests   Final    BOTTLES DRAWN AEROBIC AND ANAEROBIC Blood Culture adequate volume Performed at [facility], [address] [place], [place] [address]    Culture   Final    NO GROWTH 5 DAYS Performed at [facility], [address], [place], [place] [address]    Report Status 03/30/2024 FINAL  Final  Body fluid culture w Gram Stain     Status: None   Collection Time: 03/25/24   9:49 AM   Specimen: BILE; Body Fluid  Result Value Ref Range Status   Specimen Description   Final    BILE Performed at [facility], [address] [place], [place] [address]    Special Requests   Final    Normal Performed at [facility], [address], [place], [place] [address]    Gram Stain   Final    NO WBC SEEN FEW BUDDING YEAST SEEN RARE GRAM POSITIVE RODS Performed at [facility], [address], [place], [place] [address]  Culture   Final    MODERATE CANDIDA GLABRATA FEW ENTEROCOCCUS FAECALIS    Report Status 03/28/2024 FINAL  Final   Organism ID, Bacteria ENTEROCOCCUS FAECALIS  Final      Susceptibility   Enterococcus faecalis - MIC*    AMPICILLIN  <=2 SENSITIVE Sensitive     VANCOMYCIN  1 SENSITIVE Sensitive     GENTAMICIN SYNERGY SENSITIVE Sensitive     * FEW ENTEROCOCCUS FAECALIS  Remove urinary catheter to obtain Clean Catch urine culture     Status: None   Collection Time: 03/25/24  9:51 PM   Specimen: Urine, Clean Catch  Result Value Ref Range Status   Specimen Description   Final    URINE, CLEAN CATCH Performed at [facility], [address], [place], [place] [address]    Special Requests   Final    NONE Performed at [facility], [address] [place], [place] [address]    Culture   Final    NO GROWTH Performed at [facility], [address] [place], [place] [address]    Report Status 03/27/2024 FINAL  Final  Urine Culture     Status: None   Collection Time: 03/26/24 12:14 AM   Specimen: Urine, Catheterized  Result Value Ref Range Status   Specimen Description   Final    URINE, CATHETERIZED Performed at [facility], [address] [place], [place] [address]    Special Requests   Final    NONE Performed at [facility], [address] [place], [place] [address]    Culture   Final    NO GROWTH Performed at  [facility], [address], [place], [place] [address]    Report Status 03/28/2024 FINAL  Final  Body fluid culture w Gram Stain     Status: None   Collection Time: 03/26/24  4:36 PM   Specimen: Synovium; Body Fluid  Result Value Ref Range Status   Specimen Description   Final    SYNOVIAL RIGHT KNEE Performed at [facility], [address], [place], [place] [address]    Special Requests   Final    Normal Performed at [facility], [address] [place], [place] [address]    Gram Stain   Final    RARE WBC PRESENT, PREDOMINANTLY PMN NO ORGANISMS SEEN    Culture   Final    NO GROWTH 3 DAYS Performed at [facility], [address], [place], [place] [address]    Report Status 03/30/2024 FINAL  Final  Body fluid culture w Gram Stain     Status: None   Collection Time: 03/26/24  4:37 PM   Specimen: Synovium; Body Fluid  Result Value Ref Range Status   Specimen Description   Final    SYNOVIAL LEFT KNEE Performed at [facility], [address], [place], [place] [address]    Special Requests   Final    Normal Performed at [facility], [address] [place], [place] [address]    Gram Stain   Final    FEW WBC PRESENT, PREDOMINANTLY PMN NO ORGANISMS SEEN    Culture   Final    NO GROWTH 3 DAYS Performed at [facility], [address], [place], [place] [address]    Report Status 03/30/2024 FINAL  Final     Patient was seen and examined on the day of discharge and  was found to be in stable condition. Time coordinating discharge: 40 minutes including assessment and coordination of care, as well as examination of the patient.   SIGNED:  Delon Hoe, DO [facility] 03/30/2024, 9:56 AM       [1]  Allergies Allergen Reactions   Ace Inhibitors Swelling and Other (See Comments)    Facial swelling    Entresto  [Sacubitril-Valsartan] Swelling and Other (See Comments)     Angiotensin Receptor Neprilysin Inhibitor

## 2024-03-30 NOTE — TOC Transition Note (Addendum)
 Transition of Care [facility]) - Discharge Note   Patient Details  Name: Luis Chen MRN: [id number] Date of Birth: [birth year]  Transition [facility]) CM/SW Contact:  Tawni CHRISTELLA Eva, LCSW Phone Number: 03/30/2024, 10:32 AM   Clinical Narrative:     CSW spoke with pt's daughter to provided [facility] agency list for choice. She she has chosen [facility] [employer]. Pt's daughter reports family will provide transportation upon d/c. ICM to sign off.    Adden 1:00pm CSW received message pt is recommended for rolling walker and would like it delivered home. Referral made to [facility] for walker to be delivered to pt's home. ICM sign off.    Final next level of care: Home w Home Health Services Barriers to Discharge: Barriers Resolved   Patient Goals and CMS Choice Patient states their goals for this hospitalization and ongoing recovery are:: return home with home health CMS Medicare.gov Compare Post Acute Care list provided to:: Patient Represenative (must comment) Choice offered to / list presented to : Adult Children      Discharge Placement                    Patient and family notified of of transfer: 03/30/24  Discharge Plan and Services Additional resources added to the After Visit Summary for                                       Social Drivers of Health (SDOH) Interventions SDOH Screenings   Food Insecurity: No Food Insecurity (03/15/2024)  Housing: Low Risk (03/15/2024)  Transportation Needs: Unmet Transportation Needs (03/15/2024)  Utilities: Not At Risk (03/15/2024)  Depression (PHQ2-9): Low Risk (04/01/2023)  Financial Resource Strain: Low Risk (04/01/2023)  Physical Activity: Sufficiently Active (04/01/2023)  Social Connections: Moderately Integrated (03/15/2024)  Stress: No Stress Concern Present (04/01/2023)  Tobacco Use: Low Risk (03/17/2024)  Health Literacy: Adequate Health Literacy (04/01/2023)     Readmission Risk Interventions     No data to  display

## 2024-03-30 NOTE — Progress Notes (Signed)
 Discharge instructions reviewed with patient and patient's granddaughter, verbalized understanding and teach back was performed. All questions answered. All belongings accounted for. Patient to follow up with pcp in  1-2 weeks. PIV removed. Assisted via WC to private vehicle.   Foley Care and biliary tube care completed as well, with teach back.   Discharge Medications delivered from TOC meds to bed [facility] outpatient pharmacy.

## 2024-03-31 ENCOUNTER — Telehealth: Payer: Self-pay

## 2024-03-31 NOTE — Telephone Encounter (Signed)
 Called and left a message for nurse to call the office back to discuss concerns and any services they'd like to discuss.   Copied from CRM #[id number]. Topic: Clinical - Home Health Verbal Orders >> Mar 31, 2024  2:18 PM Wyona P wrote: Caller/Agency: [facility] Nurse [facility] Callback Number: [phone] - secured line  Service Requested: Skilled Nursing Frequency: 1time a week for 5 weeks.  Any new concerns about the patient?  No

## 2024-04-03 NOTE — Telephone Encounter (Signed)
 Called and attempted to leave a message but was notified from automated system that call could not go through due to call coming from a private number. Will attempt to call once more when in office or at a later time.   Copied from CRM #[id number]. Topic: General - Other >> Apr 03, 2024  9:50 AM Nessti S wrote: Reason for CRM: returning call back to cma CREE. She would like a call back next day office is open. Voicemail can be left as well if not answered

## 2024-04-04 ENCOUNTER — Other Ambulatory Visit: Payer: Self-pay | Admitting: Surgery

## 2024-04-04 ENCOUNTER — Telehealth: Payer: Self-pay | Admitting: Hematology and Oncology

## 2024-04-04 DIAGNOSIS — K831 Obstruction of bile duct: Secondary | ICD-10-CM

## 2024-04-04 NOTE — Telephone Encounter (Signed)
 Spoke to pt about scheduling future appt

## 2024-04-05 ENCOUNTER — Telehealth: Payer: Self-pay

## 2024-04-05 ENCOUNTER — Inpatient Hospital Stay: Attending: Hematology and Oncology | Admitting: Dietician

## 2024-04-05 NOTE — Telephone Encounter (Signed)
 Copied from CRM #[id number]. Topic: Clinical - Home Health Verbal Orders >> Apr 05, 2024  8:07 AM Adelita BRAVO wrote: Caller/Agency: Alm, PT with [facility] Number: [phone] Service Requested: Physical Therapy Frequency: 2 week 1, 1 week 3 Any new concerns about the patient? Yes, weight of 120 lbs on Friday and 4 days later patient was 110 lbs. According to patient's scale he lost 10 pounds in 4 days.   Please see note from [facility] as FYI and advise any recommendations if needed

## 2024-04-05 NOTE — Progress Notes (Signed)
 Nutrition Follow Up:   Reason for Assessment: MST screen for weight loss.    ASSESSMENT: Patient is a 84 y.o. male with newly diagnosed pancreatic mass. PMH of CKD stage III, CAD, MGUS, HLD who recently presented to ED with lightheadedness and low blood pressure, and weight loss over the last few weeks. Blood work in the ED showed elevated LFTs, significantly elevated alkaline phosphatase and bilirubin. CT abdomen/pelvis showed concern for pancreatic mass.   Patient is being worked up by Dr. Federico and has follow up scheduled next week. [facility], he is being sent [facility].  He was assessed by IP RD.  Patient reports anorexia, denies pain, bowels regular every 1-2 days smaller, formed denies blood. Grandson lives with him. Daughter does most of shopping.  PO lately: Frosted flakes with Cranberry juice Homemade veg soup (neighbor make it) Progress veg soup (has chicken) [employer] chicken, mac salad and potato salad Snacks he enjoys: Nutragrain bars, bananas, strawberries, peanut butter crackers Fluids: Coffee, Juice tea water , Energy drink  [facility], sends enough for TID     Medications: Senokot, Vit D, and FeSO4   Labs: 03/30/24 Creat 1.61, Albumin  2.9, GFR 42  Anthropometrics:  Reported weight at home today 117#, report 15# weight loss to MD during hospitalization with large fluctuations.   Height: 70 Weight: 141.5# UBW: 152-156 lbs.  BMI: 20.31  Estimated Nutritional Needs:    Kcal:  1800-2000   Protein:  90-105 g   Fluid:  2L/day  NUTRITION DIAGNOSIS: Severe Malnutrition related to acute illness (new pancreatic cancer) as evidenced by percent weight loss, severe fat depletion, severe muscle depletion.  Continues.   INTERVENTION:  Relayed that nutrition services are wrap around service provided at no charge and encouraged continued communication if experiencing continued weight loss or any nutritional impact  symptoms (NIS).  Educated on importance of adequate calorie and protein energy intake  with nutrient dense foods when possible to maintain weight/strength.  Encouraged small frequent feeds and trying to eat 6 small meals Suggested increasing his oral nutrition supplements to TID, reviewed flavoring and blending ideas, sent shake recipes to email.  Emailed Nutrition Tip sheet  for  High Protein High Calorie Snacking with Contact information provided   MONITORING, EVALUATION, GOAL: weight, PO intake, Nutrition Impact Symptoms, labs Goal is weight maintenance  Next Visit: remote 2 weeks, patient prefers mornings  Micheline Craven, RDN, LDN Registered Dietitian, [facility] Part Time Remote (Usual office hours: Tuesday-Thursday) Cell: [phone]

## 2024-04-05 NOTE — Telephone Encounter (Signed)
 Tonya called back from [facility](Nurse) and expressed that patient was in need assistance from nurse one a week for five weeks to assist with catheter and pec. Nurse is requesting orders be verbalized to Nurse [facility] and a call back on her secure line at [phone].

## 2024-04-06 NOTE — Telephone Encounter (Signed)
 Called and spoke to Tonya and gave the verbal orders.

## 2024-04-06 NOTE — Telephone Encounter (Signed)
 Please advise on recommended treatment plan. Please and thank you.  Copied from CRM #[id number]. Topic: General - Other >> Apr 06, 2024  9:36 AM Wess RAMAN wrote: Reason for CRM: Beth from [facility] stated the patient has had an additional 5lb weight loss and weakness. Very poor appetite. She would like to recommend putting him on Remeron.  Callback #: [phone]

## 2024-04-07 ENCOUNTER — Ambulatory Visit: Admitting: Family Medicine

## 2024-04-07 ENCOUNTER — Encounter: Payer: Self-pay | Admitting: Family Medicine

## 2024-04-07 VITALS — BP 91/56 | HR 109 | Temp 99.0°F | Ht 70.0 in | Wt 122.0 lb

## 2024-04-07 DIAGNOSIS — E440 Moderate protein-calorie malnutrition: Secondary | ICD-10-CM | POA: Diagnosis not present

## 2024-04-07 DIAGNOSIS — N320 Bladder-neck obstruction: Secondary | ICD-10-CM

## 2024-04-07 DIAGNOSIS — C259 Malignant neoplasm of pancreas, unspecified: Secondary | ICD-10-CM | POA: Diagnosis not present

## 2024-04-07 DIAGNOSIS — I952 Hypotension due to drugs: Secondary | ICD-10-CM

## 2024-04-07 DIAGNOSIS — K831 Obstruction of bile duct: Secondary | ICD-10-CM | POA: Diagnosis not present

## 2024-04-07 MED ORDER — MIRTAZAPINE 15 MG PO TABS: 15.0000 mg | ORAL_TABLET | Freq: Every day | ORAL | 1 refills | Status: AC

## 2024-04-07 NOTE — Progress Notes (Unsigned)
 "  Subjective  CC:  Chief Complaint  Patient presents with   Hospitalization Follow-up    03/14/2024 - 03/30/2024 (16 days) [facility]  Obstructive jaundice    HPI: Luis Chen is a 84 y.o. male who presents to the office today to address the problems listed above in the chief complaint. Patient hospitalized as noted above.  Unfortunately newly diagnosed pancreatic adenocarcinoma.  I reviewed discharge summary and hospital course, multiple records and labs.  Here is the summary excerpt from the discharge summary with useful information: Luis Chen FMW:[id number] DOB: June 23, 1940 DOA: 03/14/2024   PCP: [name], MD   Admit date: 03/14/2024 Discharge date: 03/30/2024   Admitted From: Home Disposition: Home   Recommendations for Outpatient Follow-up:  Follow up with PCP  Follow up with oncology, recommended outpatient PET/CT to evaluate for possible metastatic disease Follow-up with [facility] regarding Foley catheter and voiding trial (failed voiding trial in the hospital) Follow-up with Dr. Vernetta, orthopedic surgery as needed Follow up with IR clinic 6-8 weeks for drain exchanges. Patient will need to flush drain once daily and change dressing every 3-5 days or PRN if soiled.    Discharge Condition: Stable CODE STATUS: Full code Diet recommendation: Regular   Brief/Interim Summary: Luis Chen is a 84 y.o. male with PMH of CKD stage III, CAD, MGUS, HLD who presented with lightheadedness and low blood pressure, and weight loss over the last few weeks. Blood work in the ED showed elevated LFTs, significantly elevated alkaline phosphatase and bilirubin. CT abdomen/pelvis showed concern for pancreatic mass , labs showed AKI.  Gastroenterology and nephrology IR has been consulted.  S/p ERCP but unable to pass guidewire, now s/p perc biliary drain by IR.  Patient subsequently underwent a EUS with biopsies positive for pancreatic adenocarcinoma.  Oncology  consulted.   Discharge Diagnoses:    Principal Problem:   Obstructive jaundice (HCC) Active Problems:   Anemia, chronic disease   Chronic kidney disease (CKD), stage III (moderate) (HCC)   MGUS (monoclonal gammopathy of unknown significance)   Coronary artery disease involving native coronary artery of native heart with other form of angina pectoris   Transaminitis   Pancreatic mass   Elevated bilirubin   Abnormal CT of the abdomen   Elevated liver enzymes   Protein-calorie malnutrition, severe   Adenocarcinoma of pancreas (HCC)   Bilateral knee swelling   1. Obstructive jaundice secondary to pancreatic head mass/pancreatic adenocarcinoma - MRCP done with concerns for pancreatic head mass associated with severe dorsal pancreatic duct and biliary dilatation. - Patient seen in consultation by GI and underwent ERCP however unable to pass guidewire and IR consulted for percutaneous drain placement which was done 03/17/2024. - CA 19-9 noted to be elevated at 2010 - Cytology done with no malignant cells identified. - Patient being followed by GI and patient s/p EUS and FNA, 03/24/2024 with biopsy consistent with adenocarcinoma.  - Oncology consulted.  GI. - Patient noted to have developed a fever 03/24/2024 and subsequently placed empirically on IV Unasyn  to complete a 7-day course per GI recommendations    2.  AKI on CKD stage IIIa - Baseline creatinine noted at 1.3-1.5 in 2024 and October 2025. - CT done with no hydronephrosis and bladder thickening possible due to bladder outlet obstruction. - AKI felt likely secondary to volume depletion and bladder outlet obstruction. - Foley catheter discontinued on 03/25/2024 as patient noted to have fevers. - Patient noted to fail voiding trial and Foley  catheter placed back in, 03/26/2024. - Nephrology was following and feel patient will need urology outpatient follow-up.   3.  Metabolic acidosis - Resolved.   4.  MGUS with normocytic anemia -  Patient with no overt bleeding. - Status posttransfusion 2 units PRBCs with hemoglobin currently at 7.7 from 8.5 from 9.2 from 6.8 (03/23/2024). - Transfuse for hemoglobin < 7.   5.  CAD - Resume antiplatelet therapy   6.  Glaucoma - Eyedrops.    7.  Fever - Patient noted on 03/24/2024 to have a fever with a temp as high as 102.6.   - Leukocytosis noted to trend up transiently and started to trend back down.  - Patient with EUS with instrumentation and biopsies taken 03/24/2024. - Blood cultures obtained 03/24/2024 with no growth to date.  - Urinalysis obtained with negative leukocytes, nitrite negative, 0-5 WBCs. Urine culture is negative. - Cultures obtained from bile 03/25/2024 with moderate Candida glabrata, few Enterococcus faecalis, unsure of significance as patient afebrile, normal WBCs, fever curve trended down on empiric IV Unasyn .  - Patient underwent voiding trial 03/25/2024 which he failed and Foley catheter placed back in.  - Lipase levels obtained per GI noted to be elevated at 115 however patient with no abdominal pain and tolerating oral intake. - Continue empiric IV Unasyn  --> Augmentin  to complete a 7-day course per GI recommendations.    8.  Urinary retention - Foley catheter removed 03/25/2024, however patient with difficulty emptying out his bladder intermittently. - Patient noted to have continued urinary retention and Foley catheter had to be placed back in the evening of 03/26/2024. - Continue Flomax .   - Will need to be discharged with Foley catheter in place with outpatient follow-up with urology.    9.  Bilateral knee swelling and pain - Patient noted with bilateral swelling of his knees L > right. - Patient noted with prior history of pyogenic arthritis in 2017 requiring antibiotics. - Orthopedics consulted and patient seen by Bertrum Gaskins, PA and patient underwent aspiration of bilateral knees with left knee 50 cc of synovial slightly turbid fluid obtained, right  knee with yellow slightly turbid fluid with 30 cc obtained and sent for cell count and cultures. - Cultures with no growth so far to date. - White cell count noted on the right knee with 530, white count noted on the left knee with 19,740, no crystals seen. - Orthopedics feel more consistent with inflammatory arthropathy. - Patient currently empirically on IV Unasyn . - Appreciate orthopedic input and recommendations.   10.  Severe protein calorie malnutrition - Likely secondary to newly diagnosed pancreatic adenocarcinoma. - Nutritional supplementation  Since discharge:  presents with daughter: have several questions: Questions about Nutrition in spite of low appetite.  Reports feeling tired but not lightheaded. Has foley in place and urology appt today No cp, fevers or abdominal pain  No knee pain Reviewed all labs Has f/u with oncology.  Assessment  1. Adenocarcinoma of pancreas (HCC)   2. Biliary obstruction (HCC)   3. Bladder outlet obstruction   4. Hypotension due to drugs   5. Moderate protein-calorie malnutrition      Plan  Had long discussion; all questions answered. F/u with oncology F/u with urology Consider holding flomax  due to low bp. Hydrate and increase protein calories; strategized around that. Start remeron    Follow up: 1-3 months Visit date not found  No orders of the defined types were placed in this encounter.  Meds ordered this encounter  Medications   mirtazapine  (REMERON ) 15 MG tablet    Sig: Take 1 tablet (15 mg total) by mouth at bedtime.    Dispense:  90 tablet    Refill:  1      I reviewed the patients updated PMH, FH, and SocHx.    Patient Active Problem List   Diagnosis Date Noted   Protein-calorie malnutrition, severe 03/28/2024   Adenocarcinoma of pancreas (HCC) 03/28/2024   Bilateral knee swelling 03/28/2024   Elevated liver enzymes 03/20/2024   Pancreatic mass 03/16/2024   Elevated bilirubin 03/16/2024   Abnormal CT of the  abdomen 03/16/2024   Obstructive jaundice (HCC) 03/15/2024   Transaminitis 03/14/2024   Exposure to Agent Orange 06/23/2021   S/P CABG x 3 02/17/2018   Coronary artery disease involving native coronary artery of native heart with other form of angina pectoris 02/08/2018   Systolic HF (heart failure) (HCC) 02/08/2018   Nonischemic cardiomyopathy (HCC) 07/03/2015   Anemia, chronic disease 07/01/2015   Primary osteoarthritis of knees, bilateral 12/10/2014   MGUS (monoclonal gammopathy of unknown significance) 06/30/2010   Chronic kidney disease (CKD), stage III (moderate) (HCC) 03/24/2010   Benign essential hypertension 10/31/2008   Mixed hyperlipidemia 10/30/2008   Active Medications[1]  Allergies: Patient is allergic to ace inhibitors and entresto  [sacubitril-valsartan]. Family History: Patient family history includes Arthritis in his mother; Diabetes in his mother; Heart attack (age of onset: 69) in his mother; Heart disease in his brother and mother; Hyperlipidemia in his mother; Hypertension in his mother; Hypothyroidism in his daughter; Stroke in his daughter. Social History:  Patient  reports that he has never smoked. He has never used smokeless tobacco. He reports current alcohol use. He reports that he does not use drugs.  Review of Systems: Constitutional: Negative for fever malaise or anorexia Cardiovascular: negative for chest pain Respiratory: negative for SOB or persistent cough Gastrointestinal: negative for abdominal pain  Objective  Vitals: BP (!) 91/56   Pulse (!) 109   Temp 99 F (37.2 C)   Ht 5' 10 (1.778 m)   Wt 122 lb (55.3 kg)   SpO2 99%   BMI 17.51 kg/m  General: no acute distress , A&Ox3 thin, pleasant and comfortable HEENT: PEERL, conjunctiva normal, neck is supple Cardiovascular:  mild tachy without murmur or gallop.  Respiratory:  Good breath sounds bilaterally, CTAB with normal respiratory effort Skin:  Warm, no rashes No abdominal ttp No  peripheral edema  Commons side effects, risks, benefits, and alternatives for medications and treatment plan prescribed today were discussed, and the patient expressed understanding of the given instructions. Patient is instructed to call or message via MyChart if he/she has any questions or concerns regarding our treatment plan. No barriers to understanding were identified. We discussed Red Flag symptoms and signs in detail. Patient expressed understanding regarding what to do in case of urgent or emergency type symptoms.  Medication list was reconciled, printed and provided to the patient in AVS. Patient instructions and summary information was reviewed with the patient as documented in the AVS. This note was prepared with assistance of Dragon voice recognition software. Occasional wrong-word or sound-a-like substitutions may have occurred due to the inherent limitations of voice recognition software       [1]  Current Meds  Medication Sig   aspirin  81 MG EC tablet Take 1 tablet (81 mg total) by mouth daily.   Cholecalciferol (VITAMIN D3) 1000 units CAPS Take 1,000 Units by mouth daily.   diclofenac  Sodium (VOLTAREN ) 1 %  GEL 2 g 4 (four) times daily as needed (for pain- left knee and any other affected area).   Ferrous Gluconate  324 (37.5 Fe) MG TABS TAKE 1 TABLET BY MOUTH TWICE DAILY WITH MEALS (Patient taking differently: Take 324 mg by mouth See admin instructions. Take 324 mg by mouth with food on Mon/Wed/Fri)   KETOTIFEN FUMARATE OP Place 1 drop into both eyes See admin instructions. Ketotifen 0.025% eye drops - Instill 1 drop into both eyes one to two times a day   latanoprost  (XALATAN ) 0.005 % ophthalmic solution Place 1 drop into both eyes at bedtime.   mirtazapine  (REMERON ) 15 MG tablet Take 1 tablet (15 mg total) by mouth at bedtime.   Nutritional Supplements (SUPLENA/CARB STEADY) LIQD Take 237 mLs by mouth See admin instructions. Drink 237 ml's of Vanilla Suplena by mouth three times  a day   tamsulosin  (FLOMAX ) 0.4 MG CAPS capsule Take 1 capsule (0.4 mg total) by mouth daily after supper.   trolamine salicylate (ASPERCREME) 10 % cream Apply 1 Application topically as needed (for left knee pain and/or other affected areas).   "

## 2024-04-11 ENCOUNTER — Other Ambulatory Visit: Payer: Self-pay | Admitting: Hematology and Oncology

## 2024-04-11 ENCOUNTER — Telehealth: Payer: Self-pay | Admitting: Family Medicine

## 2024-04-11 DIAGNOSIS — C250 Malignant neoplasm of head of pancreas: Secondary | ICD-10-CM

## 2024-04-11 DIAGNOSIS — D472 Monoclonal gammopathy: Secondary | ICD-10-CM

## 2024-04-11 NOTE — Telephone Encounter (Signed)
 Home Health Certification or Plan of Care Tracking  Is this a Certification or Plan of Care? Yes  HH Agency: [facility]  Order Number:  [id number]  Has charge sheet been attached? Yes  Where has form been placed:  In provider's box  Faxed to:   [phone]

## 2024-04-11 NOTE — Telephone Encounter (Signed)
 Forms have been placed in pcp box

## 2024-04-12 ENCOUNTER — Inpatient Hospital Stay: Admitting: Hematology and Oncology

## 2024-04-12 ENCOUNTER — Inpatient Hospital Stay

## 2024-04-12 VITALS — BP 93/59 | HR 102 | Temp 98.1°F | Resp 16 | Wt 117.7 lb

## 2024-04-12 DIAGNOSIS — C250 Malignant neoplasm of head of pancreas: Secondary | ICD-10-CM

## 2024-04-12 DIAGNOSIS — D472 Monoclonal gammopathy: Secondary | ICD-10-CM

## 2024-04-12 LAB — CMP (CANCER CENTER ONLY)
ALT: 73 U/L — ABNORMAL HIGH (ref 0–44)
AST: 73 U/L — ABNORMAL HIGH (ref 15–41)
Albumin: 3.8 g/dL (ref 3.5–5.0)
Alkaline Phosphatase: 634 U/L — ABNORMAL HIGH (ref 38–126)
Anion gap: 16 — ABNORMAL HIGH (ref 5–15)
BUN: 30 mg/dL — ABNORMAL HIGH (ref 8–23)
CO2: 21 mmol/L — ABNORMAL LOW (ref 22–32)
Calcium: 10.1 mg/dL (ref 8.9–10.3)
Chloride: 99 mmol/L (ref 98–111)
Creatinine: 2.04 mg/dL — ABNORMAL HIGH (ref 0.61–1.24)
GFR, Estimated: 31 mL/min — ABNORMAL LOW
Glucose, Bld: 153 mg/dL — ABNORMAL HIGH (ref 70–99)
Potassium: 4.3 mmol/L (ref 3.5–5.1)
Sodium: 135 mmol/L (ref 135–145)
Total Bilirubin: 2 mg/dL — ABNORMAL HIGH (ref 0.0–1.2)
Total Protein: 8.1 g/dL (ref 6.5–8.1)

## 2024-04-12 LAB — CBC WITH DIFFERENTIAL (CANCER CENTER ONLY)
Abs Immature Granulocytes: 0.02 10*3/uL (ref 0.00–0.07)
Basophils Absolute: 0.1 10*3/uL (ref 0.0–0.1)
Basophils Relative: 1 %
Eosinophils Absolute: 0.5 10*3/uL (ref 0.0–0.5)
Eosinophils Relative: 7 %
HCT: 31 % — ABNORMAL LOW (ref 39.0–52.0)
Hemoglobin: 10.2 g/dL — ABNORMAL LOW (ref 13.0–17.0)
Immature Granulocytes: 0 %
Lymphocytes Relative: 32 %
Lymphs Abs: 2.5 10*3/uL (ref 0.7–4.0)
MCH: 30.6 pg (ref 26.0–34.0)
MCHC: 32.9 g/dL (ref 30.0–36.0)
MCV: 93.1 fL (ref 80.0–100.0)
Monocytes Absolute: 0.6 10*3/uL (ref 0.1–1.0)
Monocytes Relative: 7 %
Neutro Abs: 4 10*3/uL (ref 1.7–7.7)
Neutrophils Relative %: 53 %
Platelet Count: 449 10*3/uL — ABNORMAL HIGH (ref 150–400)
RBC: 3.33 MIL/uL — ABNORMAL LOW (ref 4.22–5.81)
RDW: 15.3 % (ref 11.5–15.5)
WBC Count: 7.6 10*3/uL (ref 4.0–10.5)
nRBC: 0 % (ref 0.0–0.2)

## 2024-04-12 LAB — CEA (ACCESS): CEA (CHCC): 1.72 ng/mL (ref 0.00–5.00)

## 2024-04-13 ENCOUNTER — Telehealth: Payer: Self-pay | Admitting: Hematology and Oncology

## 2024-04-13 ENCOUNTER — Encounter (HOSPITAL_COMMUNITY): Payer: Self-pay | Admitting: *Deleted

## 2024-04-13 LAB — CANCER ANTIGEN 19-9: CA 19-9: 276 U/mL — ABNORMAL HIGH (ref 0–35)

## 2024-04-13 LAB — KAPPA/LAMBDA LIGHT CHAINS
Kappa free light chain: 142.3 mg/L — ABNORMAL HIGH (ref 3.3–19.4)
Kappa, lambda light chain ratio: 3.17 — ABNORMAL HIGH (ref 0.26–1.65)
Lambda free light chains: 44.9 mg/L — ABNORMAL HIGH (ref 5.7–26.3)

## 2024-04-13 LAB — BETA 2 MICROGLOBULIN, SERUM: Beta-2 Microglobulin: 6.7 mg/L — ABNORMAL HIGH (ref 0.6–2.4)

## 2024-04-13 LAB — AFP TUMOR MARKER: AFP, Serum, Tumor Marker: 3.9 ng/mL (ref 0.0–6.4)

## 2024-04-13 NOTE — Telephone Encounter (Signed)
 Called with concerns of pt newly diagnosed cancer and how it is effecting pt

## 2024-04-13 NOTE — Telephone Encounter (Signed)
 Pt called to confirm time of scan

## 2024-04-14 ENCOUNTER — Encounter (HOSPITAL_COMMUNITY): Admission: RE | Admit: 2024-04-14 | Source: Ambulatory Visit

## 2024-04-14 DIAGNOSIS — C250 Malignant neoplasm of head of pancreas: Secondary | ICD-10-CM

## 2024-04-14 LAB — MULTIPLE MYELOMA PANEL, SERUM
Albumin SerPl Elph-Mcnc: 3.2 g/dL (ref 2.9–4.4)
Albumin/Glob SerPl: 0.8 (ref 0.7–1.7)
Alpha 1: 0.4 g/dL (ref 0.0–0.4)
Alpha2 Glob SerPl Elph-Mcnc: 1 g/dL (ref 0.4–1.0)
B-Globulin SerPl Elph-Mcnc: 1.3 g/dL (ref 0.7–1.3)
Gamma Glob SerPl Elph-Mcnc: 1.4 g/dL (ref 0.4–1.8)
Globulin, Total: 4.1 g/dL — ABNORMAL HIGH (ref 2.2–3.9)
IgA: 251 mg/dL (ref 61–437)
IgG (Immunoglobin G), Serum: 1814 mg/dL — ABNORMAL HIGH (ref 603–1613)
IgM (Immunoglobulin M), Srm: 145 mg/dL — ABNORMAL HIGH (ref 15–143)
M Protein SerPl Elph-Mcnc: 0.3 g/dL — ABNORMAL HIGH
Total Protein ELP: 7.3 g/dL (ref 6.0–8.5)

## 2024-04-14 LAB — CHROMOGRANIN A: Chromogranin A (ng/mL): 197.2 ng/mL — ABNORMAL HIGH (ref 0.0–101.8)

## 2024-04-14 LAB — GLUCOSE, CAPILLARY: Glucose-Capillary: 100 mg/dL — ABNORMAL HIGH (ref 70–99)

## 2024-04-14 MED ORDER — FLUDEOXYGLUCOSE F - 18 (FDG) INJECTION
5.8500 | Freq: Once | INTRAVENOUS | Status: AC | PRN
  Administered 2024-04-14: 5.85 via INTRAVENOUS

## 2024-04-19 ENCOUNTER — Inpatient Hospital Stay: Admitting: Dietician

## 2024-04-24 ENCOUNTER — Other Ambulatory Visit (HOSPITAL_COMMUNITY)

## 2024-05-05 ENCOUNTER — Other Ambulatory Visit

## 2024-05-12 ENCOUNTER — Other Ambulatory Visit (HOSPITAL_COMMUNITY)

## 2024-06-13 ENCOUNTER — Inpatient Hospital Stay

## 2024-06-16 ENCOUNTER — Ambulatory Visit: Admitting: Family Medicine

## 2024-06-30 ENCOUNTER — Inpatient Hospital Stay: Admitting: Physician Assistant
